# Patient Record
Sex: Female | Born: 1940 | Race: White | Hispanic: Refuse to answer | Marital: Married | Smoking: Never smoker
Health system: Southern US, Community
[De-identification: ages and names within clinical notes are randomized; demographics above are authoritative.]

## PROBLEM LIST (undated history)

## (undated) DIAGNOSIS — I10 Essential (primary) hypertension: Secondary | ICD-10-CM

## (undated) DIAGNOSIS — R270 Ataxia, unspecified: Secondary | ICD-10-CM

## (undated) DIAGNOSIS — F32A Depression, unspecified: Secondary | ICD-10-CM

## (undated) DIAGNOSIS — E7800 Pure hypercholesterolemia, unspecified: Secondary | ICD-10-CM

## (undated) DIAGNOSIS — Z22322 Carrier or suspected carrier of Methicillin resistant Staphylococcus aureus: Secondary | ICD-10-CM

## (undated) DIAGNOSIS — J449 Chronic obstructive pulmonary disease, unspecified: Secondary | ICD-10-CM

## (undated) DIAGNOSIS — I2510 Atherosclerotic heart disease of native coronary artery without angina pectoris: Secondary | ICD-10-CM

## (undated) DIAGNOSIS — F419 Anxiety disorder, unspecified: Secondary | ICD-10-CM

## (undated) DIAGNOSIS — K219 Gastro-esophageal reflux disease without esophagitis: Secondary | ICD-10-CM

## (undated) DIAGNOSIS — Z9289 Personal history of other medical treatment: Secondary | ICD-10-CM

## (undated) DIAGNOSIS — L97519 Non-pressure chronic ulcer of other part of right foot with unspecified severity: Secondary | ICD-10-CM

## (undated) DIAGNOSIS — K589 Irritable bowel syndrome without diarrhea: Secondary | ICD-10-CM

## (undated) DIAGNOSIS — IMO0001 Reserved for inherently not codable concepts without codable children: Secondary | ICD-10-CM

## (undated) DIAGNOSIS — K5790 Diverticulosis of intestine, part unspecified, without perforation or abscess without bleeding: Secondary | ICD-10-CM

## (undated) DIAGNOSIS — R296 Repeated falls: Secondary | ICD-10-CM

## (undated) DIAGNOSIS — E785 Hyperlipidemia, unspecified: Secondary | ICD-10-CM

## (undated) DIAGNOSIS — I499 Cardiac arrhythmia, unspecified: Secondary | ICD-10-CM

## (undated) DIAGNOSIS — M1990 Unspecified osteoarthritis, unspecified site: Secondary | ICD-10-CM

## (undated) DIAGNOSIS — L409 Psoriasis, unspecified: Secondary | ICD-10-CM

## (undated) DIAGNOSIS — I7300 Raynaud's syndrome without gangrene: Secondary | ICD-10-CM

## (undated) DIAGNOSIS — I219 Acute myocardial infarction, unspecified: Secondary | ICD-10-CM

## (undated) DIAGNOSIS — D649 Anemia, unspecified: Secondary | ICD-10-CM

## (undated) DIAGNOSIS — F329 Major depressive disorder, single episode, unspecified: Secondary | ICD-10-CM

## (undated) HISTORY — DX: Depression, unspecified: F32.A

## (undated) HISTORY — PX: CHOLECYSTECTOMY: SHX55

## (undated) HISTORY — DX: Major depressive disorder, single episode, unspecified: F32.9

## (undated) HISTORY — DX: Ataxia, unspecified: R27.0

## (undated) HISTORY — DX: Repeated falls: R29.6

## (undated) HISTORY — DX: Chronic obstructive pulmonary disease, unspecified: J44.9

## (undated) SURGERY — Surgical Case
Anesthesia: *Unknown

---
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
## 1978-07-17 HISTORY — PX: ABDOMINAL HYSTERECTOMY: SHX81

## 1988-07-17 HISTORY — PX: ROTATOR CUFF REPAIR: SHX139

## 2005-01-25 ENCOUNTER — Ambulatory Visit: Payer: Self-pay | Admitting: Internal Medicine

## 2006-02-07 ENCOUNTER — Ambulatory Visit: Payer: Self-pay | Admitting: Internal Medicine

## 2007-03-14 ENCOUNTER — Ambulatory Visit: Payer: Self-pay | Admitting: Internal Medicine

## 2007-04-12 ENCOUNTER — Ambulatory Visit: Payer: Self-pay | Admitting: Gastroenterology

## 2007-07-18 HISTORY — PX: KNEE ARTHROSCOPY: SUR90

## 2008-03-17 ENCOUNTER — Ambulatory Visit: Payer: Self-pay | Admitting: Internal Medicine

## 2008-11-20 ENCOUNTER — Ambulatory Visit: Payer: Self-pay | Admitting: Unknown Physician Specialty

## 2008-11-24 ENCOUNTER — Ambulatory Visit: Payer: Self-pay | Admitting: Unknown Physician Specialty

## 2009-03-30 ENCOUNTER — Ambulatory Visit: Payer: Self-pay | Admitting: Internal Medicine

## 2009-05-11 ENCOUNTER — Ambulatory Visit: Payer: Self-pay | Admitting: Unknown Physician Specialty

## 2009-06-09 ENCOUNTER — Ambulatory Visit: Payer: Self-pay | Admitting: Rheumatology

## 2009-06-18 ENCOUNTER — Ambulatory Visit: Payer: Self-pay | Admitting: Unknown Physician Specialty

## 2009-06-25 ENCOUNTER — Ambulatory Visit: Payer: Self-pay | Admitting: Unknown Physician Specialty

## 2009-10-12 ENCOUNTER — Ambulatory Visit: Payer: Self-pay | Admitting: Unknown Physician Specialty

## 2009-10-18 ENCOUNTER — Ambulatory Visit: Payer: Self-pay | Admitting: Unknown Physician Specialty

## 2009-11-16 ENCOUNTER — Ambulatory Visit: Payer: Self-pay | Admitting: Unknown Physician Specialty

## 2010-03-07 ENCOUNTER — Ambulatory Visit: Payer: Self-pay | Admitting: Unknown Physician Specialty

## 2010-03-14 ENCOUNTER — Ambulatory Visit: Payer: Self-pay | Admitting: Cardiovascular Disease

## 2010-04-27 ENCOUNTER — Ambulatory Visit: Payer: Self-pay | Admitting: Internal Medicine

## 2010-07-17 HISTORY — PX: EYE SURGERY: SHX253

## 2010-11-17 ENCOUNTER — Ambulatory Visit: Payer: Self-pay | Admitting: Unknown Physician Specialty

## 2011-05-05 ENCOUNTER — Ambulatory Visit: Payer: Self-pay | Admitting: Internal Medicine

## 2011-05-10 ENCOUNTER — Ambulatory Visit: Payer: Self-pay | Admitting: Internal Medicine

## 2011-05-17 ENCOUNTER — Ambulatory Visit: Payer: Self-pay | Admitting: Unknown Physician Specialty

## 2011-07-18 HISTORY — PX: BACK SURGERY: SHX140

## 2011-11-09 ENCOUNTER — Ambulatory Visit: Payer: Self-pay | Admitting: Internal Medicine

## 2011-11-16 ENCOUNTER — Ambulatory Visit: Payer: Self-pay | Admitting: Rheumatology

## 2012-02-05 ENCOUNTER — Other Ambulatory Visit: Payer: Self-pay | Admitting: Neurosurgery

## 2012-02-05 DIAGNOSIS — M549 Dorsalgia, unspecified: Secondary | ICD-10-CM

## 2012-02-08 ENCOUNTER — Ambulatory Visit
Admission: RE | Admit: 2012-02-08 | Discharge: 2012-02-08 | Disposition: A | Discharge status: Home / Self Care | Payer: Medicare Other | Source: Ambulatory Visit | Attending: Neurosurgery | Admitting: Neurosurgery

## 2012-02-08 VITALS — BP 138/63 | HR 58 | Resp 15 | Ht 66.0 in | Wt 140.0 lb

## 2012-02-08 DIAGNOSIS — M549 Dorsalgia, unspecified: Secondary | ICD-10-CM

## 2012-02-08 MED ORDER — DIAZEPAM 5 MG PO TABS
5.0000 mg | ORAL_TABLET | Freq: Once | ORAL | Status: AC
  Administered 2012-02-08: 5 mg via ORAL

## 2012-02-08 MED ORDER — IOHEXOL 180 MG/ML  SOLN
18.0000 mL | Freq: Once | INTRAMUSCULAR | Status: AC | PRN
  Administered 2012-02-08: 18 mL via INTRATHECAL

## 2012-02-08 MED ORDER — ONDANSETRON HCL 4 MG/2ML IJ SOLN: 4.0000 mg | Freq: Four times a day (QID) | INTRAMUSCULAR | Status: DC | PRN

## 2012-11-11 ENCOUNTER — Ambulatory Visit: Payer: Self-pay | Admitting: Internal Medicine

## 2013-01-20 ENCOUNTER — Ambulatory Visit: Payer: Self-pay | Admitting: Unknown Physician Specialty

## 2013-06-06 ENCOUNTER — Ambulatory Visit: Payer: Self-pay | Admitting: Physician Assistant

## 2013-06-16 ENCOUNTER — Ambulatory Visit: Payer: Self-pay | Admitting: Orthopedic Surgery

## 2013-06-16 LAB — CBC WITH DIFFERENTIAL/PLATELET
Basophil #: 0.1 10*3/uL (ref 0.0–0.1)
Basophil %: 0.7 %
HCT: 34.8 % — ABNORMAL LOW (ref 35.0–47.0)
Lymphocyte %: 13.8 %
MCHC: 33.4 g/dL (ref 32.0–36.0)
Monocyte #: 1.2 x10 3/mm — ABNORMAL HIGH (ref 0.2–0.9)
Monocyte %: 14.3 %
Platelet: 226 10*3/uL (ref 150–440)
RBC: 3.64 10*6/uL — ABNORMAL LOW (ref 3.80–5.20)

## 2013-06-16 LAB — BASIC METABOLIC PANEL
BUN: 23 mg/dL — ABNORMAL HIGH (ref 7–18)
Calcium, Total: 9.7 mg/dL (ref 8.5–10.1)
Co2: 30 mmol/L (ref 21–32)
Creatinine: 0.58 mg/dL — ABNORMAL LOW (ref 0.60–1.30)
EGFR (African American): >60
Glucose: 96 mg/dL (ref 65–99)
Osmolality: 281 (ref 275–301)
Sodium: 139 mmol/L (ref 136–145)

## 2013-06-25 ENCOUNTER — Ambulatory Visit: Payer: Self-pay | Admitting: Orthopedic Surgery

## 2013-11-12 ENCOUNTER — Ambulatory Visit: Payer: Self-pay | Admitting: Internal Medicine

## 2014-08-18 DIAGNOSIS — M359 Systemic involvement of connective tissue, unspecified: Secondary | ICD-10-CM | POA: Diagnosis not present

## 2014-08-18 DIAGNOSIS — Z79899 Other long term (current) drug therapy: Secondary | ICD-10-CM | POA: Diagnosis not present

## 2014-08-18 DIAGNOSIS — I7300 Raynaud's syndrome without gangrene: Secondary | ICD-10-CM | POA: Diagnosis not present

## 2014-08-18 DIAGNOSIS — M150 Primary generalized (osteo)arthritis: Secondary | ICD-10-CM | POA: Diagnosis not present

## 2014-10-12 DIAGNOSIS — Z79899 Other long term (current) drug therapy: Secondary | ICD-10-CM | POA: Diagnosis not present

## 2014-10-19 DIAGNOSIS — M25562 Pain in left knee: Secondary | ICD-10-CM | POA: Diagnosis not present

## 2014-10-19 DIAGNOSIS — G8929 Other chronic pain: Secondary | ICD-10-CM | POA: Diagnosis not present

## 2014-10-19 DIAGNOSIS — M359 Systemic involvement of connective tissue, unspecified: Secondary | ICD-10-CM | POA: Diagnosis not present

## 2014-10-19 DIAGNOSIS — I7300 Raynaud's syndrome without gangrene: Secondary | ICD-10-CM | POA: Diagnosis not present

## 2014-10-19 DIAGNOSIS — Z79899 Other long term (current) drug therapy: Secondary | ICD-10-CM | POA: Diagnosis not present

## 2014-11-04 DIAGNOSIS — I10 Essential (primary) hypertension: Secondary | ICD-10-CM | POA: Diagnosis not present

## 2014-11-04 DIAGNOSIS — J449 Chronic obstructive pulmonary disease, unspecified: Secondary | ICD-10-CM | POA: Diagnosis not present

## 2014-11-04 DIAGNOSIS — E785 Hyperlipidemia, unspecified: Secondary | ICD-10-CM | POA: Diagnosis not present

## 2014-11-04 DIAGNOSIS — D649 Anemia, unspecified: Secondary | ICD-10-CM | POA: Diagnosis not present

## 2014-11-06 NOTE — Op Note (Signed)
PATIENT NAME:  Melissa [name], Melissa [name] MR#:  [id number] DATE OF BIRTH:  July 23, 1940  DATE OF PROCEDURE:  06/25/2013  PREOPERATIVE DIAGNOSIS: Left knee Baker's cyst.   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   PROCEDURE: Arthroscopy, partial lateral meniscectomy, arthroscopic debridement, excision of Baker's cyst.   ANESTHESIA: General.   SURGEON: Hessie Knows, M.D.   DESCRIPTION OF PROCEDURE: The patient was brought to the Operating Room and, after adequate anesthesia was obtained, the left leg was placed in the arthroscopic leg holder with a tourniquet applied but not required during the procedure with the prepped and draped in the usual sterile manner. Appropriate patient identification and timeout procedures were carried out. An inferolateral portal was made and the arthroscope was introduced. Initial inspection revealed  mild central patellar chondromalacia, relatively normal femoral trochlea. Coming around medially, an inferomedial portal was made. There was relatively normal articular cartilage on the femur and tibia with just a small degenerative tear of the posterior horn of the medial meniscus involving just a few millimeters of the meniscus. This was subsequently ablated with the ArthroCare wand. The ACL was intact. In the lateral compartment, there was significant findings with exposed bone on portions of the femoral condyle and tibial condyle with extensive degenerative tears of the anterior, posterior and middle thirds of the lateral meniscus. A meniscal punch, shaver and ArthroCare wand were used to debride these along with some very large loose pieces of articular cartilage which were not really loose bodies as they were not solid and not of a large enough size, but several were fairly significant size consistent with recent articular cartilage injury.   At this point, having addressed the intra-articular pathology, the posteromedial Baker's  cyst was addressed. Placing the arthroscope through the notch, the posterior flap could be visualized into the Baker's cyst. A posteromedial portal was made initially using a spinal needle posterior to the collateral ligament and then a small stab incision made followed by placing of a probe. Next, a shaver was introduced to debride the superficial layers until the cyst fluid could be seen to come out.  When this path was identified, it was opened up to approximately a centimeter in diameter. With direct pressure on the cyst, the cyst could be felt to drain into the knee and with turning the inflow back on the distal cyst filled again with fluid. After adequate debridement posteriorly and pre- and postprocedure pictures being obtained, all instrumentation was withdrawn.   The wounds were closed with simple interrupted 4-0 nylon, single suture for each incision. Local anesthetic, 20 mL of 0.5% Sensorcaine with epinephrine, was infiltrated into the area of portals for postoperative analgesia. Sterile dressings of Xeroform, 4 x 4's, Webril and Ace wrap were applied and the patient was sent to the Recovery Room in stable condition. There was no complications.   SPECIMENS:  None.     ____________________________ Laurene Footman, MD mjm:cs D: 06/25/2013 17:50:13 ET T: 06/25/2013 20:21:32 ET JOB#: [id number]  cc: Laurene Footman, MD, <Dictator> Laurene Footman MD ELECTRONICALLY SIGNED 06/26/2013 11:30

## 2014-11-06 NOTE — Consult Note (Signed)
PATIENT NAME:  Melissa [name], Melissa [name] MR#:  [id number] DATE OF BIRTH:  08-Oct-1940  DATE OF CONSULTATION:  06/25/2013  REFERRING PHYSICIAN:  Dr. Rudene Christians. CONSULTING PHYSICIAN:  Corey Skains, MD  REASON FOR CONSULTATION:  Irregular heartbeats, heart block with pulmonary fibrosis.   CHIEF COMPLAINT: The patient has had recent surgery.  HISTORY OF PRESENT ILLNESS:  This is a 74 year old female with no history of cardiovascular disease, who has had pulmonary fibrosis and recent surgery. With her surgery and anesthesia, the patient has had some irregular heartbeat, and EKG showing normal sinus rhythm, otherwise normal EKG. Telemetry showing second-degree type I AV block and 3 atrial contractions but no other malignant rhythms or other EKG changes. There  has been no chest pain, shortness of breath or congestive heart failure. The patient has been stable at this time, and these rhythms are slowly improving with recovery.   REVIEW OF SYSTEMS:  Not able to assess due to some obtundation.   PAST MEDICAL HISTORY:  1.  Pulmonary fibrosis.  2.  Arthritis.    FAMILY HISTORY: No family members with early onset of cardiovascular disease or hypertension.   SOCIAL HISTORY: The patient current denies alcohol or tobacco use.   CURRENT MEDICATIONS AND ALLERGIES: As listed.   PHYSICAL EXAMINATION: VITAL SIGNS: Blood pressure is 122/68 bilaterally. Heart rate is 70 upright, reclining and regular.  GENERAL: She is a well-appearing female in no acute distress.  HEAD, EYES, EARS, NOSE, THROAT: No icterus, thyromegaly, ulcers, hemorrhage or xanthelasma.  CARDIOVASCULAR: Regular rate and rhythm with normal S1 and S2 without murmur, gallop  or rub. PMI is normal size and placement. Carotid upstroke normal without bruit. JVD appears normal.  LUNGS: Have few basilar crackles with normal respirations.  ABDOMEN: Soft, nontender, without hepatosplenomegaly or masses. Abdominal aorta is normal size without bruit.  EXTREMITIES:  Shows 2+ bilateral pulses in dorsal, pedal, radial and femoral arteries without lower extremity edema, signs of clubbing or ulcers.  NEUROLOGIC: She is oriented to time, place and person, with normal mood and affect.   ASSESSMENT: A 74 year old female with pulmonary fibrosis and recent surgery with 3 atrial   contractions, second-degree type I AV block without evidence of significant cardiovascular abnormalities requiring further intervention.   RECOMMENDATIONS: 1.  No further intervention of benign   atrial contractions and other rhythm disturbances.  2.  Continue recovery without restriction.  3.  No further cardiac intervention at this time.        ____________________________ Corey Skains, MD bjk:dmm D: 06/25/2013 17:55:00 ET T: 06/25/2013 19:05:35 ET JOB#: [id number]  cc: Corey Skains, MD, <Dictator> Corey Skains MD ELECTRONICALLY SIGNED 06/26/2013 16:53

## 2014-11-10 ENCOUNTER — Other Ambulatory Visit: Payer: Self-pay | Admitting: Internal Medicine

## 2014-11-10 DIAGNOSIS — Z139 Encounter for screening, unspecified: Secondary | ICD-10-CM

## 2014-11-18 ENCOUNTER — Other Ambulatory Visit: Payer: Self-pay | Admitting: Internal Medicine

## 2014-11-18 ENCOUNTER — Ambulatory Visit
Admission: RE | Admit: 2014-11-18 | Discharge: 2014-11-18 | Disposition: A | Discharge status: Home / Self Care | Payer: Commercial Managed Care - HMO | Source: Ambulatory Visit | Attending: Internal Medicine | Admitting: Internal Medicine

## 2014-11-18 DIAGNOSIS — Z1231 Encounter for screening mammogram for malignant neoplasm of breast: Secondary | ICD-10-CM | POA: Diagnosis not present

## 2014-11-18 DIAGNOSIS — Z139 Encounter for screening, unspecified: Secondary | ICD-10-CM

## 2014-11-24 DIAGNOSIS — M47817 Spondylosis without myelopathy or radiculopathy, lumbosacral region: Secondary | ICD-10-CM | POA: Diagnosis not present

## 2014-11-27 DIAGNOSIS — H524 Presbyopia: Secondary | ICD-10-CM | POA: Diagnosis not present

## 2014-11-27 DIAGNOSIS — H5210 Myopia, unspecified eye: Secondary | ICD-10-CM | POA: Diagnosis not present

## 2014-12-02 DIAGNOSIS — H40003 Preglaucoma, unspecified, bilateral: Secondary | ICD-10-CM | POA: Diagnosis not present

## 2015-01-20 DIAGNOSIS — D649 Anemia, unspecified: Secondary | ICD-10-CM | POA: Diagnosis not present

## 2015-01-20 DIAGNOSIS — I10 Essential (primary) hypertension: Secondary | ICD-10-CM | POA: Diagnosis not present

## 2015-01-20 DIAGNOSIS — M359 Systemic involvement of connective tissue, unspecified: Secondary | ICD-10-CM | POA: Diagnosis not present

## 2015-01-20 DIAGNOSIS — E785 Hyperlipidemia, unspecified: Secondary | ICD-10-CM | POA: Diagnosis not present

## 2015-01-20 DIAGNOSIS — Z79899 Other long term (current) drug therapy: Secondary | ICD-10-CM | POA: Diagnosis not present

## 2015-04-13 DIAGNOSIS — M359 Systemic involvement of connective tissue, unspecified: Secondary | ICD-10-CM | POA: Diagnosis not present

## 2015-04-13 DIAGNOSIS — Z79899 Other long term (current) drug therapy: Secondary | ICD-10-CM | POA: Diagnosis not present

## 2015-04-20 DIAGNOSIS — M359 Systemic involvement of connective tissue, unspecified: Secondary | ICD-10-CM | POA: Diagnosis not present

## 2015-04-20 DIAGNOSIS — M150 Primary generalized (osteo)arthritis: Secondary | ICD-10-CM | POA: Diagnosis not present

## 2015-04-20 DIAGNOSIS — I7300 Raynaud's syndrome without gangrene: Secondary | ICD-10-CM | POA: Diagnosis not present

## 2015-04-20 DIAGNOSIS — Z79899 Other long term (current) drug therapy: Secondary | ICD-10-CM | POA: Diagnosis not present

## 2015-04-20 DIAGNOSIS — D500 Iron deficiency anemia secondary to blood loss (chronic): Secondary | ICD-10-CM | POA: Diagnosis not present

## 2015-04-20 DIAGNOSIS — G8929 Other chronic pain: Secondary | ICD-10-CM | POA: Diagnosis not present

## 2015-04-20 DIAGNOSIS — M25562 Pain in left knee: Secondary | ICD-10-CM | POA: Diagnosis not present

## 2015-04-28 DIAGNOSIS — D500 Iron deficiency anemia secondary to blood loss (chronic): Secondary | ICD-10-CM | POA: Diagnosis not present

## 2015-05-12 DIAGNOSIS — D649 Anemia, unspecified: Secondary | ICD-10-CM | POA: Diagnosis not present

## 2015-05-12 DIAGNOSIS — Z0000 Encounter for general adult medical examination without abnormal findings: Secondary | ICD-10-CM | POA: Diagnosis not present

## 2015-05-12 DIAGNOSIS — K219 Gastro-esophageal reflux disease without esophagitis: Secondary | ICD-10-CM | POA: Diagnosis not present

## 2015-05-12 DIAGNOSIS — J431 Panlobular emphysema: Secondary | ICD-10-CM | POA: Diagnosis not present

## 2015-05-12 DIAGNOSIS — J439 Emphysema, unspecified: Secondary | ICD-10-CM | POA: Diagnosis not present

## 2015-05-12 DIAGNOSIS — Z23 Encounter for immunization: Secondary | ICD-10-CM | POA: Diagnosis not present

## 2015-05-12 DIAGNOSIS — I10 Essential (primary) hypertension: Secondary | ICD-10-CM | POA: Diagnosis not present

## 2015-05-12 DIAGNOSIS — Z79899 Other long term (current) drug therapy: Secondary | ICD-10-CM | POA: Diagnosis not present

## 2015-05-12 DIAGNOSIS — E782 Mixed hyperlipidemia: Secondary | ICD-10-CM | POA: Diagnosis not present

## 2015-06-02 DIAGNOSIS — H40003 Preglaucoma, unspecified, bilateral: Secondary | ICD-10-CM | POA: Diagnosis not present

## 2015-07-14 DIAGNOSIS — M359 Systemic involvement of connective tissue, unspecified: Secondary | ICD-10-CM | POA: Diagnosis not present

## 2015-07-14 DIAGNOSIS — Z79899 Other long term (current) drug therapy: Secondary | ICD-10-CM | POA: Diagnosis not present

## 2015-07-14 DIAGNOSIS — E782 Mixed hyperlipidemia: Secondary | ICD-10-CM | POA: Diagnosis not present

## 2015-07-21 DIAGNOSIS — M150 Primary generalized (osteo)arthritis: Secondary | ICD-10-CM | POA: Diagnosis not present

## 2015-07-21 DIAGNOSIS — Z79899 Other long term (current) drug therapy: Secondary | ICD-10-CM | POA: Diagnosis not present

## 2015-07-21 DIAGNOSIS — E782 Mixed hyperlipidemia: Secondary | ICD-10-CM | POA: Diagnosis not present

## 2015-07-21 DIAGNOSIS — I7300 Raynaud's syndrome without gangrene: Secondary | ICD-10-CM | POA: Diagnosis not present

## 2015-07-21 DIAGNOSIS — M359 Systemic involvement of connective tissue, unspecified: Secondary | ICD-10-CM | POA: Diagnosis not present

## 2015-08-07 DIAGNOSIS — R5383 Other fatigue: Secondary | ICD-10-CM | POA: Diagnosis not present

## 2015-08-07 DIAGNOSIS — I10 Essential (primary) hypertension: Secondary | ICD-10-CM | POA: Diagnosis not present

## 2015-08-07 DIAGNOSIS — J410 Simple chronic bronchitis: Secondary | ICD-10-CM | POA: Diagnosis not present

## 2015-08-11 DIAGNOSIS — M175 Other unilateral secondary osteoarthritis of knee: Secondary | ICD-10-CM | POA: Diagnosis not present

## 2015-10-06 DIAGNOSIS — M546 Pain in thoracic spine: Secondary | ICD-10-CM | POA: Diagnosis not present

## 2015-10-06 DIAGNOSIS — F3341 Major depressive disorder, recurrent, in partial remission: Secondary | ICD-10-CM | POA: Diagnosis not present

## 2015-10-06 DIAGNOSIS — R0781 Pleurodynia: Secondary | ICD-10-CM | POA: Diagnosis not present

## 2015-10-06 DIAGNOSIS — J431 Panlobular emphysema: Secondary | ICD-10-CM | POA: Diagnosis not present

## 2015-10-06 DIAGNOSIS — R0602 Shortness of breath: Secondary | ICD-10-CM | POA: Diagnosis not present

## 2015-10-19 DIAGNOSIS — Z79899 Other long term (current) drug therapy: Secondary | ICD-10-CM | POA: Diagnosis not present

## 2015-10-19 DIAGNOSIS — I7300 Raynaud's syndrome without gangrene: Secondary | ICD-10-CM | POA: Diagnosis not present

## 2015-10-19 DIAGNOSIS — M150 Primary generalized (osteo)arthritis: Secondary | ICD-10-CM | POA: Diagnosis not present

## 2015-11-10 DIAGNOSIS — Z1239 Encounter for other screening for malignant neoplasm of breast: Secondary | ICD-10-CM | POA: Diagnosis not present

## 2015-11-10 DIAGNOSIS — R0609 Other forms of dyspnea: Secondary | ICD-10-CM | POA: Diagnosis not present

## 2015-11-10 DIAGNOSIS — I10 Essential (primary) hypertension: Secondary | ICD-10-CM | POA: Diagnosis not present

## 2015-11-10 DIAGNOSIS — R270 Ataxia, unspecified: Secondary | ICD-10-CM | POA: Diagnosis not present

## 2015-11-10 DIAGNOSIS — E782 Mixed hyperlipidemia: Secondary | ICD-10-CM | POA: Diagnosis not present

## 2015-11-10 DIAGNOSIS — J431 Panlobular emphysema: Secondary | ICD-10-CM | POA: Diagnosis not present

## 2015-11-10 DIAGNOSIS — R42 Dizziness and giddiness: Secondary | ICD-10-CM | POA: Diagnosis not present

## 2015-11-15 ENCOUNTER — Other Ambulatory Visit: Payer: Self-pay | Admitting: Internal Medicine

## 2015-11-15 DIAGNOSIS — Z1231 Encounter for screening mammogram for malignant neoplasm of breast: Secondary | ICD-10-CM

## 2015-11-22 DIAGNOSIS — R0609 Other forms of dyspnea: Secondary | ICD-10-CM | POA: Diagnosis not present

## 2015-11-22 DIAGNOSIS — R42 Dizziness and giddiness: Secondary | ICD-10-CM | POA: Diagnosis not present

## 2015-11-24 ENCOUNTER — Other Ambulatory Visit: Payer: Self-pay | Admitting: Internal Medicine

## 2015-11-24 ENCOUNTER — Ambulatory Visit
Admission: RE | Admit: 2015-11-24 | Discharge: 2015-11-24 | Disposition: A | Discharge status: Home / Self Care | Payer: Commercial Managed Care - HMO | Source: Ambulatory Visit | Attending: Internal Medicine | Admitting: Internal Medicine

## 2015-11-24 DIAGNOSIS — Z1231 Encounter for screening mammogram for malignant neoplasm of breast: Secondary | ICD-10-CM | POA: Diagnosis not present

## 2015-11-30 DIAGNOSIS — E7800 Pure hypercholesterolemia, unspecified: Secondary | ICD-10-CM | POA: Diagnosis not present

## 2015-11-30 DIAGNOSIS — J410 Simple chronic bronchitis: Secondary | ICD-10-CM | POA: Diagnosis not present

## 2015-11-30 DIAGNOSIS — J8410 Pulmonary fibrosis, unspecified: Secondary | ICD-10-CM | POA: Diagnosis not present

## 2015-11-30 DIAGNOSIS — R002 Palpitations: Secondary | ICD-10-CM | POA: Diagnosis not present

## 2015-11-30 DIAGNOSIS — I10 Essential (primary) hypertension: Secondary | ICD-10-CM | POA: Diagnosis not present

## 2015-11-30 DIAGNOSIS — R42 Dizziness and giddiness: Secondary | ICD-10-CM | POA: Diagnosis not present

## 2015-12-03 DIAGNOSIS — R1031 Right lower quadrant pain: Secondary | ICD-10-CM | POA: Diagnosis not present

## 2015-12-03 DIAGNOSIS — R197 Diarrhea, unspecified: Secondary | ICD-10-CM | POA: Diagnosis not present

## 2015-12-03 DIAGNOSIS — R1032 Left lower quadrant pain: Secondary | ICD-10-CM | POA: Diagnosis not present

## 2015-12-06 DIAGNOSIS — R42 Dizziness and giddiness: Secondary | ICD-10-CM | POA: Diagnosis not present

## 2015-12-07 ENCOUNTER — Other Ambulatory Visit: Payer: Self-pay | Admitting: Internal Medicine

## 2015-12-07 DIAGNOSIS — R112 Nausea with vomiting, unspecified: Secondary | ICD-10-CM | POA: Diagnosis not present

## 2015-12-07 DIAGNOSIS — R197 Diarrhea, unspecified: Secondary | ICD-10-CM | POA: Diagnosis not present

## 2015-12-07 DIAGNOSIS — Z79899 Other long term (current) drug therapy: Secondary | ICD-10-CM | POA: Diagnosis not present

## 2015-12-07 DIAGNOSIS — R1084 Generalized abdominal pain: Secondary | ICD-10-CM

## 2015-12-08 DIAGNOSIS — R197 Diarrhea, unspecified: Secondary | ICD-10-CM | POA: Diagnosis not present

## 2015-12-10 DIAGNOSIS — H524 Presbyopia: Secondary | ICD-10-CM | POA: Diagnosis not present

## 2015-12-10 DIAGNOSIS — H5210 Myopia, unspecified eye: Secondary | ICD-10-CM | POA: Diagnosis not present

## 2015-12-14 DIAGNOSIS — H40003 Preglaucoma, unspecified, bilateral: Secondary | ICD-10-CM | POA: Diagnosis not present

## 2015-12-20 ENCOUNTER — Ambulatory Visit
Admission: RE | Admit: 2015-12-20 | Discharge: 2015-12-20 | Disposition: A | Discharge status: Home / Self Care | Payer: Commercial Managed Care - HMO | Source: Ambulatory Visit | Attending: Internal Medicine | Admitting: Internal Medicine

## 2015-12-20 ENCOUNTER — Ambulatory Visit: Admission: RE | Admit: 2015-12-20 | Payer: Commercial Managed Care - HMO | Source: Ambulatory Visit

## 2015-12-20 DIAGNOSIS — K5730 Diverticulosis of large intestine without perforation or abscess without bleeding: Secondary | ICD-10-CM | POA: Diagnosis not present

## 2015-12-20 DIAGNOSIS — I700 Atherosclerosis of aorta: Secondary | ICD-10-CM | POA: Diagnosis not present

## 2015-12-20 DIAGNOSIS — R197 Diarrhea, unspecified: Secondary | ICD-10-CM | POA: Insufficient documentation

## 2015-12-20 DIAGNOSIS — R1084 Generalized abdominal pain: Secondary | ICD-10-CM | POA: Diagnosis not present

## 2015-12-20 HISTORY — DX: Raynaud's syndrome without gangrene: I73.00

## 2015-12-20 HISTORY — DX: Essential (primary) hypertension: I10

## 2015-12-20 MED ORDER — IOPAMIDOL (ISOVUE-300) INJECTION 61%
85.0000 mL | Freq: Once | INTRAVENOUS | Status: AC | PRN
  Administered 2015-12-20: 85 mL via INTRAVENOUS

## 2016-01-05 DIAGNOSIS — R270 Ataxia, unspecified: Secondary | ICD-10-CM | POA: Diagnosis not present

## 2016-01-05 DIAGNOSIS — R296 Repeated falls: Secondary | ICD-10-CM | POA: Diagnosis not present

## 2016-01-20 DIAGNOSIS — Z79899 Other long term (current) drug therapy: Secondary | ICD-10-CM | POA: Diagnosis not present

## 2016-01-20 DIAGNOSIS — I7300 Raynaud's syndrome without gangrene: Secondary | ICD-10-CM | POA: Diagnosis not present

## 2016-01-20 DIAGNOSIS — M150 Primary generalized (osteo)arthritis: Secondary | ICD-10-CM | POA: Diagnosis not present

## 2016-01-26 DIAGNOSIS — D500 Iron deficiency anemia secondary to blood loss (chronic): Secondary | ICD-10-CM | POA: Diagnosis not present

## 2016-01-26 DIAGNOSIS — I7300 Raynaud's syndrome without gangrene: Secondary | ICD-10-CM | POA: Diagnosis not present

## 2016-01-26 DIAGNOSIS — M150 Primary generalized (osteo)arthritis: Secondary | ICD-10-CM | POA: Diagnosis not present

## 2016-01-26 DIAGNOSIS — M359 Systemic involvement of connective tissue, unspecified: Secondary | ICD-10-CM | POA: Diagnosis not present

## 2016-01-26 DIAGNOSIS — Z79899 Other long term (current) drug therapy: Secondary | ICD-10-CM | POA: Diagnosis not present

## 2016-01-26 DIAGNOSIS — R260 Ataxic gait: Secondary | ICD-10-CM | POA: Diagnosis not present

## 2016-01-28 ENCOUNTER — Other Ambulatory Visit: Payer: Self-pay | Admitting: Orthopedic Surgery

## 2016-01-28 DIAGNOSIS — M175 Other unilateral secondary osteoarthritis of knee: Secondary | ICD-10-CM

## 2016-02-01 DIAGNOSIS — M150 Primary generalized (osteo)arthritis: Secondary | ICD-10-CM | POA: Diagnosis not present

## 2016-02-04 DIAGNOSIS — M810 Age-related osteoporosis without current pathological fracture: Secondary | ICD-10-CM | POA: Diagnosis not present

## 2016-02-09 DIAGNOSIS — J410 Simple chronic bronchitis: Secondary | ICD-10-CM | POA: Diagnosis not present

## 2016-02-09 DIAGNOSIS — K580 Irritable bowel syndrome with diarrhea: Secondary | ICD-10-CM | POA: Diagnosis not present

## 2016-02-09 DIAGNOSIS — J8410 Pulmonary fibrosis, unspecified: Secondary | ICD-10-CM | POA: Diagnosis not present

## 2016-02-09 DIAGNOSIS — R29898 Other symptoms and signs involving the musculoskeletal system: Secondary | ICD-10-CM | POA: Diagnosis not present

## 2016-02-09 DIAGNOSIS — M150 Primary generalized (osteo)arthritis: Secondary | ICD-10-CM | POA: Diagnosis not present

## 2016-02-09 DIAGNOSIS — E7800 Pure hypercholesterolemia, unspecified: Secondary | ICD-10-CM | POA: Diagnosis not present

## 2016-02-09 DIAGNOSIS — F411 Generalized anxiety disorder: Secondary | ICD-10-CM | POA: Diagnosis not present

## 2016-02-21 ENCOUNTER — Encounter: Payer: Self-pay | Admitting: Physical Therapy

## 2016-02-21 ENCOUNTER — Ambulatory Visit: Payer: Commercial Managed Care - HMO | Attending: Internal Medicine | Admitting: Physical Therapy

## 2016-02-21 DIAGNOSIS — M6281 Muscle weakness (generalized): Secondary | ICD-10-CM | POA: Insufficient documentation

## 2016-02-21 DIAGNOSIS — R262 Difficulty in walking, not elsewhere classified: Secondary | ICD-10-CM

## 2016-02-21 DIAGNOSIS — R2681 Unsteadiness on feet: Secondary | ICD-10-CM | POA: Diagnosis not present

## 2016-02-21 NOTE — Therapy (Signed)
[facility] [address], [place], [address] Phone: [phone]   Fax:  [phone]  Physical Therapy Evaluation  Patient Details  Name: Melissa [name] MRN: [id number] Date of Birth: [birth year] Referring Provider: Doy Hutching  Encounter Date: 02/21/2016      PT End of Session - 02/21/16 0924    Visit Number 1   Number of Visits 25   Date for PT Re-Evaluation 04/03/16   Authorization Type g codes   Authorization Time Period 1/10   PT Start Time 0827   PT Stop Time 0916   PT Time Calculation (min) 49 min   Equipment Utilized During Treatment Gait belt   Activity Tolerance Patient tolerated treatment well   Behavior During Therapy [facility] for tasks assessed/performed      Past Medical History:  Diagnosis Date  . Ataxia   . COPD (chronic obstructive pulmonary disease) ([place])   . Depression   . Excessive falling   . Hypertension   . Raynaud's disease without gangrene   . Raynaud's disease without gangrene     Past Surgical History:  Procedure Laterality Date  . BACK SURGERY    . KNEE ARTHROSCOPY     partial lateral meniscectomy, debridement, excision of Baker's cyst  . ROTATOR CUFF REPAIR      There were no vitals filed for this visit.       Subjective Assessment - 02/21/16 0834    Subjective Pt presents with B L>R leg weakness and pain. She is planning to have L knee TKR on September 19th. Pt reports her MD wanted her to get stronger prior to her surgery for a better recovery. She has had 2 falls in April/May. Pt wears L knee brace for support.   Limitations Lifting;Walking   How long can you walk comfortably? hurts "every step"   Patient Stated Goals to build strength before knee surgery   Currently in Pain? Yes   Pain Score 3   L knee   Pain Descriptors / Indicators Aching   Pain Type Chronic pain   Pain Onset More than a month ago   Pain Frequency Intermittent   Aggravating Factors  standing,  bending, walking   Pain Relieving Factors rest, pain medicine   Effect of Pain on Daily Activities decreased            [facility] PT Assessment - 02/21/16 0001      Assessment   Medical Diagnosis B leg weakness   Referring Provider [place]   Onset Date/Surgical Date --  chronic   Next MD Visit 3 months   Prior Therapy none     Precautions   Precautions Fall     Restrictions   Weight Bearing Restrictions No     Balance Screen   Has the patient fallen in the past 6 months Yes   How many times? 2   Has the patient had a decrease in activity level because of a fear of falling?  Yes   Is the patient reluctant to leave their home because of a fear of falling?  No     Home Ecologist residence   Living Arrangements Spouse/significant other   Available Help at Discharge Family   Type of [place] to enter   Entrance Stairs-Number of Steps 1   [place] One level   [place] - 2 wheels  Prior Function   Level of [place] Independent   Vocation Retired   Leisure reading, playing on [place]: Forwards head, rounded shoulders, kyphotic   PROM/AROM: B UE and LE grossly WFL Pain with end range L knee flexion  STRENGTH:  Graded on a 0-5 scale Muscle Group Left Right  Hip Flex 3+/5 4/5  Hip Abd 3+/5 4/5  Hip Add 4/5 4/5  Hip Ext    Hip IR/ER    Knee Flex 3+/5 4/5  Knee Ext 3+/5 4/5  Ankle DF 3+/5 4/5  Ankle PF     SENSATION: L LE L3 and below reduced to light touch by ~15%   SPECIAL TESTS: LEFS  36/80  FUNCTIONAL MOBILITY: Reduced walking distance, standing tolerance and ability to rise  BALANCE: Fair standing balance  GAIT: Significant B (L>R) knee valgus, L knee brace applied, no AD, decreased trunk rotation  OUTCOME MEASURES: TEST Outcome Interpretation  5 times sit<>stand 27.08 sec >60 yo, >15 sec indicates increased risk  for falls  10 meter walk test       0.67 m/s <1.0 m/s indicates increased risk for falls; limited community ambulator  Timed up and Go       15.21 sec <14 sec indicates increased risk for falls   Therex:  Supine  Heel slides, SLR, SAQs, hip abd slides, bridges, clamshells with YTB x10 each  HEP of these provided to pt. See pt instructions.   Cues for proper technique of exercises to target specific muscles and slow eccentric contractions for most effective strengthening.                     PT Education - 02/21/16 0926    Education provided Yes   Education Details HEP see pt instructions   Person(s) Educated Patient   Methods Explanation;Demonstration;Handout   Comprehension Verbalized understanding;Returned demonstration             PT Long Term Goals - 02/21/16 1039      PT LONG TERM GOAL #1   Title Pt will score <15 sec on 5x STS to reduce risk of falls withint 12 weeks.   Baseline 27.08 sec   Time 12   Period Weeks   Status New     PT LONG TERM GOAL #2   Title Pt will score >1.0 m/s on 10 MW for increased functional mobility without AD within 12 weeks.   Baseline 0.67 m/s   Time 12   Period Weeks   Status New     PT LONG TERM GOAL #3   Title Pt will score 10 sec or less on TUG to reduce risk of falls within 12 weeks.   Baseline 15.21 sec1   Time 12   Period Weeks   Status New     PT LONG TERM GOAL #4   Title Pt will improve LEFS score by 9 points for increased function within 12 weeks.   Baseline 36/80   Time 12   Period Weeks   Status New               Plan - 02/21/16 3557    Clinical Impression Statement 75 yo F presented with B LE weakness and knee pain L>R. She has decreased gait speed and impaired mechanics, strength, balance and endurance deficits indicated by 5x STS, 10 meter walk and TUG scores. LEFS score 36/80. She will benefit from skilled PT services to address functional deficits and  improve functional mobility and  QOL.   Rehab Potential Fair   Clinical Impairments Affecting Rehab Potential age, OA, chronic   PT Frequency 2x / week   PT Duration 12 weeks   PT Treatment/Interventions Cryotherapy;Electrical Stimulation;Moist Heat;Ultrasound;Gait training;DME Lexicographer;Therapeutic activities;Therapeutic exercise;Balance training;Neuromuscular re-education;Patient/family education;Manual techniques   PT Next Visit Plan LE strengthening   PT Home Exercise Plan see pt instructions   Consulted and Agree with Plan of Care Patient      Patient will benefit from skilled therapeutic intervention in order to improve the following deficits and impairments:  Abnormal gait, Decreased balance, Decreased endurance, Decreased strength, Difficulty walking, Impaired sensation, Pain  Visit Diagnosis: Muscle weakness (generalized) - Plan: PT plan of care cert/re-cert  Difficulty in walking, not elsewhere classified - Plan: PT plan of care cert/re-cert  Unsteadiness on feet - Plan: PT plan of care cert/re-cert      G-Codes - 50/41/36 1046    Functional Assessment Tool Used 5x STS, 10 MW,, TUG, LEFS   Functional Limitation Mobility: Walking and moving around   Mobility: Walking and Moving Around Current Status (C3837) At least 40 percent but less than 60 percent impaired, limited or restricted   Mobility: Walking and Moving Around Goal Status (R9396) At least 1 percent but less than 20 percent impaired, limited or restricted       Problem List There are no active problems to display for this patient.   Neoma Laming, PT, DPT  02/21/16, 10:58 AM [facility] [address] [place], [address] Phone: [phone]   Fax:  [phone]  Name: LEAIRA FULLAM MRN: [id number] Date of Birth: 05-08-1941

## 2016-02-21 NOTE — Patient Instructions (Addendum)
HEP provided and reviewed: supine heel slides, SLR, SAQ, hip abd slides, bridges 2x10 each. Created on [email or web address]

## 2016-02-23 ENCOUNTER — Encounter: Payer: Self-pay | Admitting: Physical Therapy

## 2016-02-23 ENCOUNTER — Ambulatory Visit: Payer: Commercial Managed Care - HMO | Admitting: Physical Therapy

## 2016-02-23 DIAGNOSIS — R2681 Unsteadiness on feet: Secondary | ICD-10-CM | POA: Diagnosis not present

## 2016-02-23 DIAGNOSIS — R262 Difficulty in walking, not elsewhere classified: Secondary | ICD-10-CM | POA: Diagnosis not present

## 2016-02-23 DIAGNOSIS — M6281 Muscle weakness (generalized): Secondary | ICD-10-CM | POA: Diagnosis not present

## 2016-02-23 NOTE — Therapy (Addendum)
[facility] [address], [place], [address] Phone: [phone]   Fax:  [phone]  Physical Therapy Treatment  Patient Details  Name: Melissa [name] MRN: [id number] Date of Birth: [birth year] Referring Provider: Doy Hutching  Encounter Date: 02/23/2016      PT End of Session - 02/23/16 1038    Visit Number 2   Number of Visits 25   Date for PT Re-Evaluation March 30, 2016   Authorization Type g codes   Authorization Time Period 2/10   PT Start Time 1030   PT Stop Time 1116   PT Time Calculation (min) 46 min   Equipment Utilized During Treatment Gait belt   Activity Tolerance Patient tolerated treatment well   Behavior During Therapy [facility] for tasks assessed/performed      Past Medical History:  Diagnosis Date  . Ataxia   . COPD (chronic obstructive pulmonary disease) ([place])   . Depression   . Excessive falling   . Hypertension   . Raynaud's disease without gangrene   . Raynaud's disease without gangrene     Past Surgical History:  Procedure Laterality Date  . BACK SURGERY    . KNEE ARTHROSCOPY     partial lateral meniscectomy, debridement, excision of Baker's cyst  . ROTATOR CUFF REPAIR      There were no vitals filed for this visit.      Subjective Assessment - 02/23/16 1035    Subjective Pt reports she is doing pretty good. Her back is a little sore today. She reports the HEP is going well.   Limitations Lifting;Walking   How long can you walk comfortably? hurts "every step"   Patient Stated Goals to build strength before knee surgery   Currently in Pain? Yes   Pain Score 4   L knee   Pain Descriptors / Indicators Aching   Pain Type Chronic pain   Pain Onset More than a month ago   Pain Frequency Intermittent      Therex:  Nustep L1 x2 min (warm up), L3 x 7 min, L1 x1 min (cool down) for LE strengthening and endurance training Cues for foot placement, pushing through heels, and maintaining  SPM 60 or above during L3, and reduced SPM during cool down.   Supine:   Heel slides, SLR, SAQs, hip abd slides, bridges, clamshells with YTB 2x10 each   Standing: Hip SLR, abd, ext, and march with YTB 2x10 each  Seated: LAQs with 4# 2x10  Cues for proper technique of exercises to target specific muscles and slow eccentric contractions for most effective strengthening.                           PT Education - 02/23/16 1037    Education provided Yes   Education Details continue HEP   Person(s) Educated Patient   Methods Explanation;Demonstration   Comprehension Verbalized understanding;Returned demonstration             PT Long Term Goals - 02/21/16 1039      PT LONG TERM GOAL #1   Title Pt will score <15 sec on 5x STS to reduce risk of falls withint 12 weeks.   Baseline 27.08 sec   Time 12   Period Weeks   Status New     PT LONG TERM GOAL #2   Title Pt will score >1.0 m/s on 10 MW for increased functional mobility without AD within 12 weeks.  Baseline 0.67 m/s   Time 12   Period Weeks   Status New     PT LONG TERM GOAL #3   Title Pt will score 10 sec or less on TUG to reduce risk of falls within 12 weeks.   Baseline 15.21 sec1   Time 12   Period Weeks   Status New     PT LONG TERM GOAL #4   Title Pt will improve LEFS score by 9 points for increased function within 12 weeks.   Baseline 36/80   Time 12   Period Weeks   Status New               Plan - 02/23/16 1054    Clinical Impression Statement Pt performed LE strengthening and endurance exercises with no increase in pain or complaints this session. Eccentric control is difficult, but improves with cues. Occasional therapeutic rest breaks for energy conservation. She will benefit from continued skilled PT for increased LE strength and funcitonal mobility.   Rehab Potential Fair   Clinical Impairments Affecting Rehab Potential age, OA, chronic   PT Frequency 2x / week   PT  Duration 12 weeks   PT Treatment/Interventions Cryotherapy;Electrical Stimulation;Moist Heat;Ultrasound;Gait training;DME Lexicographer;Therapeutic activities;Therapeutic exercise;Balance training;Neuromuscular re-education;Patient/family education;Manual techniques   PT Next Visit Plan LE strengthening   PT Home Exercise Plan see pt instructions   Consulted and Agree with Plan of Care Patient      Patient will benefit from skilled therapeutic intervention in order to improve the following deficits and impairments:  Abnormal gait, Decreased balance, Decreased endurance, Decreased strength, Difficulty walking, Impaired sensation, Pain  Visit Diagnosis: Muscle weakness (generalized)  Difficulty in walking, not elsewhere classified  Unsteadiness on feet     Problem List There are no active problems to display for this patient.   Colsen Modi Shiela Mayer, PT, DPT  02/23/16, 11:19 AM [facility] [address] [place], [address] Phone: [phone]   Fax:  [phone]  Name: Melissa [name] MRN: [id number] Date of Birth: 10-19-1940

## 2016-02-24 ENCOUNTER — Encounter: Payer: Self-pay | Admitting: Physical Therapy

## 2016-02-28 ENCOUNTER — Encounter: Payer: Self-pay | Admitting: Physical Therapy

## 2016-02-28 ENCOUNTER — Ambulatory Visit: Payer: Commercial Managed Care - HMO | Admitting: Physical Therapy

## 2016-02-28 DIAGNOSIS — R2681 Unsteadiness on feet: Secondary | ICD-10-CM

## 2016-02-28 DIAGNOSIS — M6281 Muscle weakness (generalized): Secondary | ICD-10-CM

## 2016-02-28 DIAGNOSIS — R262 Difficulty in walking, not elsewhere classified: Secondary | ICD-10-CM | POA: Diagnosis not present

## 2016-02-28 NOTE — Therapy (Signed)
[facility] [address] [place], [address] Phone: [phone]   Fax:  [phone]  Physical Therapy Treatment  Patient Details  Name: Melissa [name] MRN: [id number] Date of Birth: 05-Jul-1941 Referring Provider: Doy Hutching  Encounter Date: 02/28/2016      PT End of Session - 02/28/16 1438    Visit Number 3   Number of Visits 25   Date for PT Re-Evaluation March 30, 2016   Authorization Type g codes   Authorization Time Period 3/10   PT Start Time 1429   PT Stop Time 1514   PT Time Calculation (min) 45 min   Equipment Utilized During Treatment Gait belt   Activity Tolerance Patient tolerated treatment well   Behavior During Therapy [facility] for tasks assessed/performed      Past Medical History:  Diagnosis Date  . Ataxia   . COPD (chronic obstructive pulmonary disease) ([place])   . Depression   . Excessive falling   . Hypertension   . Raynaud's disease without gangrene   . Raynaud's disease without gangrene     Past Surgical History:  Procedure Laterality Date  . BACK SURGERY    . KNEE ARTHROSCOPY     partial lateral meniscectomy, debridement, excision of Baker's cyst  . ROTATOR CUFF REPAIR      There were no vitals filed for this visit.      Subjective Assessment - 02/28/16 1434    Subjective Pt is doing well. She reports HEP is going well. She has had increased discomfort in her baker's cyst of L LE.   Limitations Lifting;Walking   How long can you walk comfortably? hurts "every step"   Patient Stated Goals to build strength before knee surgery   Currently in Pain? Yes   Pain Score 4   L knee   Pain Descriptors / Indicators Aching   Pain Type Chronic pain   Pain Onset More than a month ago      Therex:   Nustep L2 x2 min (warm up), L5 x 5 min, L1 x2 min (cool down) for LE strengthening and endurance training Cues for foot placement, pushing through heels, and maintaining SPM 60 or above during  L5, and reduced SPM during cool down.   Supine:   Heel slides, SLR, SAQs, hip abd slides with 2# 2x10 Bridges 2x10 Clamshells with YTB 2x10 each   Standing: Hip SLR, abd, ext, and march with YTB 2x10 each   Seated: LAQs with 4# 2x10   Cues for proper technique of exercises to target specific muscles and slow eccentric contractions for most effective strengthening.                            PT Education - 02/28/16 1437    Education provided Yes   Education Details continue HEP   Person(s) Educated Patient   Methods Explanation;Demonstration   Comprehension Verbalized understanding;Returned demonstration             PT Long Term Goals - 02/21/16 1039      PT LONG TERM GOAL #1   Title Pt will score <15 sec on 5x STS to reduce risk of falls withint 12 weeks.   Baseline 27.08 sec   Time 12   Period Weeks   Status New     PT LONG TERM GOAL #2   Title Pt will score >1.0 m/s on 10 MW for increased functional mobility without AD  within 12 weeks.   Baseline 0.67 m/s   Time 12   Period Weeks   Status New     PT LONG TERM GOAL #3   Title Pt will score 10 sec or less on TUG to reduce risk of falls within 12 weeks.   Baseline 15.21 sec1   Time 12   Period Weeks   Status New     PT LONG TERM GOAL #4   Title Pt will improve LEFS score by 9 points for increased function within 12 weeks.   Baseline 36/80   Time 12   Period Weeks   Status New               Plan - 02/28/16 1444    Clinical Impression Statement Pt was able to tolerate increased exercise intensity this session. She required minimal cues for proper technique of exercises. Pt will benefit from continued strengthening to increase joint support and mobility with reduced pain in L knee.   Rehab Potential Fair   Clinical Impairments Affecting Rehab Potential age, OA, chronic   PT Frequency 2x / week   PT Duration 12 weeks   PT Treatment/Interventions Cryotherapy;Electrical  Stimulation;Moist Heat;Ultrasound;Gait training;DME Lexicographer;Therapeutic activities;Therapeutic exercise;Balance training;Neuromuscular re-education;Patient/family education;Manual techniques   PT Next Visit Plan LE strengthening   PT Home Exercise Plan see pt instructions   Consulted and Agree with Plan of Care Patient      Patient will benefit from skilled therapeutic intervention in order to improve the following deficits and impairments:  Abnormal gait, Decreased balance, Decreased endurance, Decreased strength, Difficulty walking, Impaired sensation, Pain  Visit Diagnosis: Muscle weakness (generalized)  Difficulty in walking, not elsewhere classified  Unsteadiness on feet     Problem List There are no active problems to display for this patient.   Natividad Schlosser Shiela Mayer, PT, DPT  02/28/16, 3:17 PM [facility] [address] [place], [address] Phone: [phone]   Fax:  [phone]  Name: Melissa [name] MRN: [id number] Date of Birth: Aug 15, 1940

## 2016-02-29 DIAGNOSIS — M810 Age-related osteoporosis without current pathological fracture: Secondary | ICD-10-CM | POA: Diagnosis not present

## 2016-03-01 ENCOUNTER — Ambulatory Visit
Admission: RE | Admit: 2016-03-01 | Discharge: 2016-03-01 | Disposition: A | Discharge status: Home / Self Care | Payer: Commercial Managed Care - HMO | Source: Ambulatory Visit | Attending: Orthopedic Surgery | Admitting: Orthopedic Surgery

## 2016-03-01 DIAGNOSIS — M175 Other unilateral secondary osteoarthritis of knee: Secondary | ICD-10-CM

## 2016-03-01 DIAGNOSIS — M1712 Unilateral primary osteoarthritis, left knee: Secondary | ICD-10-CM | POA: Insufficient documentation

## 2016-03-01 DIAGNOSIS — M179 Osteoarthritis of knee, unspecified: Secondary | ICD-10-CM | POA: Diagnosis not present

## 2016-03-02 ENCOUNTER — Encounter: Payer: Self-pay | Admitting: Physical Therapy

## 2016-03-02 ENCOUNTER — Ambulatory Visit: Payer: Commercial Managed Care - HMO | Admitting: Physical Therapy

## 2016-03-02 DIAGNOSIS — M6281 Muscle weakness (generalized): Secondary | ICD-10-CM | POA: Diagnosis not present

## 2016-03-02 DIAGNOSIS — R2681 Unsteadiness on feet: Secondary | ICD-10-CM | POA: Diagnosis not present

## 2016-03-02 DIAGNOSIS — R262 Difficulty in walking, not elsewhere classified: Secondary | ICD-10-CM | POA: Diagnosis not present

## 2016-03-02 NOTE — Therapy (Signed)
[facility] [address] [place], [address] Phone: [phone]   Fax:  [phone]  Physical Therapy Treatment  Patient Details  Name: Melissa [name] MRN: [id number] Date of Birth: February 03, 1941 Referring Provider: Doy Hutching  Encounter Date: 03/02/2016      PT End of Session - 03/02/16 1620    Visit Number 4   Number of Visits 25   Date for PT Re-Evaluation 05/15/16   Authorization Type 4   Authorization Time Period 10 Dareen Piano)   PT Start Time 1614   PT Stop Time 1705   PT Time Calculation (min) 51 min   Activity Tolerance Patient tolerated treatment well   Behavior During Therapy [facility] for tasks assessed/performed      Past Medical History:  Diagnosis Date  . Ataxia   . COPD (chronic obstructive pulmonary disease) ([place])   . Depression   . Excessive falling   . Hypertension   . Raynaud's disease without gangrene   . Raynaud's disease without gangrene     Past Surgical History:  Procedure Laterality Date  . BACK SURGERY    . KNEE ARTHROSCOPY     partial lateral meniscectomy, debridement, excision of Baker's cyst  . ROTATOR CUFF REPAIR      There were no vitals filed for this visit.      Subjective Assessment - 03/02/16 1617    Subjective Patient reports she is doing exercises as instructed. She has been very busy this week.    Limitations Lifting;Walking   Patient Stated Goals to build strength before knee surgery   Currently in Pain? Yes   Pain Score 3    Pain Location Knee   Pain Orientation Left   Pain Descriptors / Indicators Aching;Sharp   Pain Type Chronic pain   Pain Onset More than a month ago   Pain Frequency Intermittent     Objective: Strength: left LE decreased hip abduction, extension, ER, knee extension and flexion strength as compared to right  Treatment: Therapeutic exercise: patient performed exercises with guidance, verbal and tactile cues and demonstration of  PT: Sitting:  hip adduction with ball and glute sets with 3 second hold x 15 reps hip abduction with resistive band in sitting x 15 reps  roll ball under each foot x 1 min. Multi angle isometric knee flexion x 3 sets each LE Knee flexion with red resistive band x 15 reps each LE Standing: On Airex pad sway forward and back and side to side x 1 min. Each walk forward and backwards at side of treadmill for UE support as needed for balance x 5 reps Side stepping along balance beam airex pad x 5 reps  NuStep x 10 min. Level #2 alternate UE's and LE's each minute (unbilled)  Patient response to treatment: required assistance, moderate cuing to perform all exercise with correct alignment and technique, improved with repetition        PT Education - 03/02/16 1630    Education provided Yes   Education Details HEP: added hip adduction with ball and glute sets, hip abduction with resistive band in sitting, roll ball under each foot x 1 min., standing sway forward, back and side to side, walk forward and backwards at [employer]) Educated Patient   Methods Explanation;Demonstration;Verbal cues;Handout   Comprehension Verbalized understanding;Returned demonstration;Verbal cues required             PT Long Term Goals - 02/21/16 1039  PT LONG TERM GOAL #1   Title Pt will score <15 sec on 5x STS to reduce risk of falls withint 12 weeks.   Baseline 27.08 sec   Time 12   Period Weeks   Status New     PT LONG TERM GOAL #2   Title Pt will score >1.0 m/s on 10 MW for increased functional mobility without AD within 12 weeks.   Baseline 0.67 m/s   Time 12   Period Weeks   Status New     PT LONG TERM GOAL #3   Title Pt will score 10 sec or less on TUG to reduce risk of falls within 12 weeks.   Baseline 15.21 sec1   Time 12   Period Weeks   Status New     PT LONG TERM GOAL #4   Title Pt will improve LEFS score by 9 points for increased function within 12 weeks.    Baseline 36/80   Time 12   Period Weeks   Status New               Plan - 03/02/16 1702    Clinical Impression Statement Patient demonstrated good understanding of home exercises following demonstration, verbal cuing. She continues with pain and weakness in left LE and will benefit from continued physical therapy intervention for strengthening and improving endurance to prepare for surgery. May benefit from estim. For muscle re education left quadriceps next session   Rehab Potential Fair   PT Frequency 2x / week   PT Duration 12 weeks   PT Treatment/Interventions Cryotherapy;Electrical Stimulation;Moist Heat;Ultrasound;Gait training;DME Lexicographer;Therapeutic activities;Therapeutic exercise;Balance training;Neuromuscular re-education;Patient/family education;Manual techniques   PT Next Visit Plan electrical stimulation for left quadriceps, strengthening exercises   PT Home Exercise Plan as outlined in treatment/pt. education      Patient will benefit from skilled therapeutic intervention in order to improve the following deficits and impairments:  Abnormal gait, Decreased balance, Decreased endurance, Decreased strength, Difficulty walking, Impaired sensation, Pain  Visit Diagnosis: Muscle weakness (generalized)  Difficulty in walking, not elsewhere classified     Problem List There are no active problems to display for this patient.   Jomarie Longs PT 03/03/2016, 2:29 PM  [facility] [address], [place], [address] Phone: [phone]   Fax:  [phone]  Name: Melissa [name] MRN: [id number] Date of Birth: 24-Mar-1941

## 2016-03-06 ENCOUNTER — Ambulatory Visit: Payer: Commercial Managed Care - HMO | Admitting: Physical Therapy

## 2016-03-06 ENCOUNTER — Encounter: Payer: Self-pay | Admitting: Physical Therapy

## 2016-03-06 DIAGNOSIS — M6281 Muscle weakness (generalized): Secondary | ICD-10-CM

## 2016-03-06 DIAGNOSIS — R262 Difficulty in walking, not elsewhere classified: Secondary | ICD-10-CM

## 2016-03-06 DIAGNOSIS — R2681 Unsteadiness on feet: Secondary | ICD-10-CM | POA: Diagnosis not present

## 2016-03-06 NOTE — Therapy (Signed)
[facility] [address] [place], [address] Phone: [phone]   Fax:  [phone]  Physical Therapy Treatment  Patient Details  Name: Melissa [name] MRN: [id number] Date of Birth: [birth year] Referring Provider: Doy Hutching  Encounter Date: 03/06/2016      PT End of Session - 03/06/16 1053    Visit Number 5   Number of Visits 25   Date for PT Re-Evaluation 05/15/16   Authorization Type 5   Authorization Time Period 10 Dareen Piano)   PT Start Time 1037   PT Stop Time 1126   PT Time Calculation (min) 49 min   Equipment Utilized During Treatment --   Activity Tolerance Patient tolerated treatment well   Behavior During Therapy [facility] for tasks assessed/performed      Past Medical History:  Diagnosis Date  . Ataxia   . COPD (chronic obstructive pulmonary disease) ([place])   . Depression   . Excessive falling   . Hypertension   . Raynaud's disease without gangrene   . Raynaud's disease without gangrene     Past Surgical History:  Procedure Laterality Date  . BACK SURGERY    . KNEE ARTHROSCOPY     partial lateral meniscectomy, debridement, excision of Baker's cyst  . ROTATOR CUFF REPAIR      There were no vitals filed for this visit.      Subjective Assessment - 03/06/16 1038    Subjective Patient reports no worse than previous session with left LE. She is doing well. She still notices weakness and difficulty with walking due to left leg pain/weakness in knee.   Limitations Lifting;Walking   Patient Stated Goals to build strength before knee surgery   Currently in Pain? Yes   Pain Score 3    Pain Location Neck   Pain Orientation Left   Pain Descriptors / Indicators Aching;Sharp   Pain Type Chronic pain   Pain Onset More than a month ago   Pain Frequency Intermittent       Objective: Gait: ambulating into clinic with brace left knee, mild limp on left LE Observation; decreased quad contraction,  muscle bulk left LE as compared to right  Treatment: Patella mobilization left LE with patella mobilizer x 3 reps for distraction and inferior/superior glides Modalities; Electrical stimulation: russian stim. 10/10 cycle with patient performing SAQ x 3 reps each cycle; (2) electrodes applied to left VMO/quadriceps left LE, intensity to muscle contraction/tolerance, paint long sitting on treatment table with pillow supporting LE x 15 min. Therapeutic exercise: patient performed exercises with guidance, verbal and tactile cues and demonstration of PT:  Standing: On Airex pad sway forward and back and side to side x 1 min. Each walk forward and backwards at side of counter for UE support as needed for balance x 5 reps Side stepping along balance beam airex pad x 5 reps Step ups onto airex pad leading with each LE x 10 with verbal cues for proper sequencing  NuStep x 10 min. Level #3 alternate UE's and LE's each minute (unbilled) (averaged 100 steps/min., .50 mile  Patient response to treatment: Patient required less assistance and support for balance with exercises demonstrating improved strength and proprioception. She required moderate verbal cuing for step ups for proper sequencing and verbal cuing for correct sequencing and technique for backward walking. Technique improved with repetition         PT Education - 03/06/16 1051    Education provided Yes   Education Details  HEP: re assessed home exercises   Person(s) Educated Patient   Methods Explanation;Demonstration;Verbal cues   Comprehension Verbalized understanding;Returned demonstration;Verbal cues required             PT Long Term Goals - 02/21/16 1039      PT LONG TERM GOAL #1   Title Pt will score <15 sec on 5x STS to reduce risk of falls withint 12 weeks.   Baseline 27.08 sec   Time 12   Period Weeks   Status New     PT LONG TERM GOAL #2   Title Pt will score >1.0 m/s on 10 MW for increased functional  mobility without AD within 12 weeks.   Baseline 0.67 m/s   Time 12   Period Weeks   Status New     PT LONG TERM GOAL #3   Title Pt will score 10 sec or less on TUG to reduce risk of falls within 12 weeks.   Baseline 15.21 sec1   Time 12   Period Weeks   Status New     PT LONG TERM GOAL #4   Title Pt will improve LEFS score by 9 points for increased function within 12 weeks.   Baseline 36/80   Time 12   Period Weeks   Status New               Plan - 03/06/16 1054    Clinical Impression Statement Patient demonstrates good response to estim. and is performing exercises with verbal cuing and assistance. She continues with weakness in left LE and will benefit from continued physical therpay intervention for progressive exercises to prepare for surgery.    Rehab Potential Fair   PT Frequency 2x / week   PT Duration 12 weeks   PT Treatment/Interventions Cryotherapy;Electrical Stimulation;Moist Heat;Ultrasound;Gait training;DME Lexicographer;Therapeutic activities;Therapeutic exercise;Balance training;Neuromuscular re-education;Patient/family education;Manual techniques   PT Next Visit Plan electrical stimulation for left quadriceps, strengthening exercises   PT Home Exercise Plan as outlined in treatment/pt. education      Patient will benefit from skilled therapeutic intervention in order to improve the following deficits and impairments:  Abnormal gait, Decreased balance, Decreased endurance, Decreased strength, Difficulty walking, Impaired sensation, Pain  Visit Diagnosis: Muscle weakness (generalized)  Difficulty in walking, not elsewhere classified     Problem List There are no active problems to display for this patient.   Jomarie Longs PT 03/06/2016, 2:34 PM  [facility] [address], [place], [address] Phone: [phone]   Fax:  [phone]  Name: MADEEHA COSTANTINO MRN:  [id number] Date of Birth: Dec 23, 1940

## 2016-03-08 ENCOUNTER — Encounter: Payer: Self-pay | Admitting: Physical Therapy

## 2016-03-08 ENCOUNTER — Ambulatory Visit: Payer: Commercial Managed Care - HMO | Admitting: Physical Therapy

## 2016-03-08 DIAGNOSIS — M6281 Muscle weakness (generalized): Secondary | ICD-10-CM | POA: Diagnosis not present

## 2016-03-08 DIAGNOSIS — R2681 Unsteadiness on feet: Secondary | ICD-10-CM | POA: Diagnosis not present

## 2016-03-08 DIAGNOSIS — R262 Difficulty in walking, not elsewhere classified: Secondary | ICD-10-CM | POA: Diagnosis not present

## 2016-03-09 ENCOUNTER — Encounter: Payer: Self-pay | Admitting: Physical Therapy

## 2016-03-09 NOTE — Therapy (Signed)
[facility] [address], [place], [address] Phone: [phone]   Fax:  [phone]  Physical Therapy Treatment  Patient Details  Name: Melissa [name] MRN: [id number] Date of Birth: [birth year] Referring Provider: Doy Hutching  Encounter Date: 03/08/2016      PT End of Session - 03/08/16 1015    Visit Number 6   Number of Visits 25   Date for PT Re-Evaluation 05/15/16   Authorization Type 6   Authorization Time Period 10 Dareen Piano)   PT Start Time [phone]   PT Stop Time 1035   PT Time Calculation (min) 47 min   Activity Tolerance Patient tolerated treatment well   Behavior During Therapy [facility] for tasks assessed/performed      Past Medical History:  Diagnosis Date  . Ataxia   . COPD (chronic obstructive pulmonary disease) ([place])   . Depression   . Excessive falling   . Hypertension   . Raynaud's disease without gangrene   . Raynaud's disease without gangrene     Past Surgical History:  Procedure Laterality Date  . BACK SURGERY    . KNEE ARTHROSCOPY     partial lateral meniscectomy, debridement, excision of Baker's cyst  . ROTATOR CUFF REPAIR      There were no vitals filed for this visit.      Subjective Assessment - 03/08/16 1013    Subjective mild pain in left knee primarily with walking   Limitations Lifting;Walking   Patient Stated Goals to build strength before knee surgery   Currently in Pain? Yes   Pain Score 2    Pain Orientation Left   Pain Descriptors / Indicators Aching   Pain Type Chronic pain   Pain Onset More than a month ago   Pain Frequency Intermittent      Objective: Gait: ambulating into clinic with brace left knee, mild limp on left LE Observation; decreased quad contraction, muscle bulk left LE as compared to right, improved from previous session  Treatment: Patella mobilization left LE with patella mobilizer x 3 reps for distraction and inferior/superior  glides Modalities; Electrical stimulation: russian stim. 10/10 cycle with patient performing SAQ  With 2# weight on ankle. x 3 reps each cycle; (2) electrodes applied to left VMO/quadriceps left LE, intensity to muscle contraction/tolerance, paint long sitting on treatment table with pillow supporting LE x 15 min. Therapeutic exercise: patient performed exercises with guidance, verbal and tactile cues and demonstration of PT:  Standing: Side stepping on airex balance beam x 3 min. Hip adduction with ball and glute sets in sitting, hip abduction with resistive band in sitting Knee flexion with red resistive band in sitting 2 x 15 reps  NuStep x 10 min. Level #3 alternate UE's and LE's each minute (unbilled) (averaged 100 steps/min., .50 mile  Patient response to treatment:Patient demonstrates good understanding of exercises and able to perform with good technique with minimal cuing. Improved quad firing, contraction with good control with estim. Required cuing to position LE's on Nustep for keeping left knee aligned wiithout IR at hip         PT Education - 03/08/16 1014    Education provided Yes   Education Details HEP: work on Hotel manager quads, added [employer]) Educated Patient   Methods Explanation;Demonstration;Verbal cues   Comprehension Verbalized understanding;Returned demonstration;Verbal cues required             PT Long Term Goals - 02/21/16 1039  PT LONG TERM GOAL #1   Title Pt will score <15 sec on 5x STS to reduce risk of falls withint 12 weeks.   Baseline 27.08 sec   Time 12   Period Weeks   Status New     PT LONG TERM GOAL #2   Title Pt will score >1.0 m/s on 10 MW for increased functional mobility without AD within 12 weeks.   Baseline 0.67 m/s   Time 12   Period Weeks   Status New     PT LONG TERM GOAL #3   Title Pt will score 10 sec or less on TUG to reduce risk of falls within 12 weeks.   Baseline 15.21 sec1   Time 12   Period Weeks    Status New     PT LONG TERM GOAL #4   Title Pt will improve LEFS score by 9 points for increased function within 12 weeks.   Baseline 36/80   Time 12   Period Weeks   Status New               Plan - 03/08/16 1035    Clinical Impression Statement Patient demonsrates improving strength, muscle control and improved bulk in left quadriceps. Pain is primary limiting factor to functional walking, daily tasks and she continues with hip muscle and LE weakness that will require additional physical therapy intervention to prepare for surgery.   Rehab Potential Fair   PT Frequency 2x / week   PT Duration 12 weeks   PT Treatment/Interventions Cryotherapy;Electrical Stimulation;Moist Heat;Ultrasound;Gait training;DME Lexicographer;Therapeutic activities;Therapeutic exercise;Balance training;Neuromuscular re-education;Patient/family education;Manual techniques   PT Next Visit Plan electrical stimulation for left quadriceps, strengthening exercises   PT Home Exercise Plan hip and knee exercises for ROM/strengthening      Patient will benefit from skilled therapeutic intervention in order to improve the following deficits and impairments:  Abnormal gait, Decreased balance, Decreased endurance, Decreased strength, Difficulty walking, Impaired sensation, Pain  Visit Diagnosis: Muscle weakness (generalized)  Difficulty in walking, not elsewhere classified     Problem List There are no active problems to display for this patient.   Jomarie Longs PT 03/09/2016, 10:18 AM  [facility] [address] [place], [address] Phone: [phone]   Fax:  [phone]  Name: Melissa [name] MRN: [id number] Date of Birth: 1941-06-05

## 2016-03-14 ENCOUNTER — Ambulatory Visit: Payer: Commercial Managed Care - HMO | Admitting: Physical Therapy

## 2016-03-14 ENCOUNTER — Encounter: Payer: Self-pay | Admitting: Physical Therapy

## 2016-03-14 DIAGNOSIS — M6281 Muscle weakness (generalized): Secondary | ICD-10-CM | POA: Diagnosis not present

## 2016-03-14 DIAGNOSIS — R262 Difficulty in walking, not elsewhere classified: Secondary | ICD-10-CM | POA: Diagnosis not present

## 2016-03-14 DIAGNOSIS — R2681 Unsteadiness on feet: Secondary | ICD-10-CM | POA: Diagnosis not present

## 2016-03-15 NOTE — Therapy (Signed)
[facility] [address] [place], [address] Phone: [phone]   Fax:  [phone]  Physical Therapy Treatment  Patient Details  Name: Melissa [name] MRN: [id number] Date of Birth: 01-Jun-1941 Referring Provider: Doy Hutching  Encounter Date: 03/14/2016      PT End of Session - 03/14/16 1154    Visit Number 7   Number of Visits 25   Date for PT Re-Evaluation 05/15/16   Authorization Type 7   Authorization Time Period 10 ([employer]code)   PT Start Time 1045   PT Stop Time 1130   PT Time Calculation (min) 45 min   Activity Tolerance Patient tolerated treatment well   Behavior During Therapy [facility] for tasks assessed/performed      Past Medical History:  Diagnosis Date  . Ataxia   . COPD (chronic obstructive pulmonary disease) ([place])   . Depression   . Excessive falling   . Hypertension   . Raynaud's disease without gangrene   . Raynaud's disease without gangrene     Past Surgical History:  Procedure Laterality Date  . BACK SURGERY    . KNEE ARTHROSCOPY     partial lateral meniscectomy, debridement, excision of Baker's cyst  . ROTATOR CUFF REPAIR      There were no vitals filed for this visit.      Subjective Assessment - 03/14/16 1104    Subjective Patient reports she is getting stronger in left leg with current treatment. She has pain with increased walking, weight bearing.    Limitations Lifting;Walking   Patient Stated Goals to build strength before knee surgery   Currently in Pain? Yes   Pain Score 2    Pain Location Neck   Pain Orientation Left   Pain Descriptors / Indicators Aching   Pain Type Chronic pain   Pain Onset More than a month ago       Objective: Gait: ambulating into clinic with brace left knee, mild limp on left LE Observation; decreased quad contraction, muscle bulk left LE as compared to right, improved from previous session  Treatment: Patella mobilization left LE with  patella mobilizer x 3 reps for distraction and inferior/superior glides Modalities; Electrical stimulation: russian stim. 10/10 cycle with patient performing SAQ  With 2# weight on ankle. x 3 reps each cycle; (2) electrodes applied to left VMO/quadriceps left LE, intensity to muscle contraction/tolerance, patient long sitting on treatment table with pillow supporting LE x 20 min. Therapeutic exercise: patient performed exercises with guidance, verbal and tactile cues and demonstration of PT:  Standing: Hip adduction with ball and glute sets in sitting x 15 reps hip abduction with resistive band in sitting, green, x 15 reps Knee flexion with green resistive band in sitting 2 x 15 reps  NuStep x 13 min. Level #3(unbilled) at end of session  Patient response to treatment:Patient with improved quad control and able to perform exercises without increased pain following patella mobilization and russian stim.Patient demonstrates good understanding of exercises and able to perform with good technique with minimal cuing.  Required cuing to position LE's on Nustep to keep left knee aligned without IR at hip        PT Education - 03/14/16 1104    Education provided Yes   Education Details HEP: continue with home exercises for strengthening: SLR, SAQ with 2# weight, hamstring exercise   Person(s) Educated Patient   Methods Explanation;Demonstration;Verbal cues   Comprehension Verbalized understanding;Returned demonstration;Verbal cues required  PT Long Term Goals - 02/21/16 1039      PT LONG TERM GOAL #1   Title Pt will score <15 sec on 5x STS to reduce risk of falls withint 12 weeks.   Baseline 27.08 sec   Time 12   Period Weeks   Status New     PT LONG TERM GOAL #2   Title Pt will score >1.0 m/s on 10 MW for increased functional mobility without AD within 12 weeks.   Baseline 0.67 m/s   Time 12   Period Weeks   Status New     PT LONG TERM GOAL #3   Title Pt will  score 10 sec or less on TUG to reduce risk of falls within 12 weeks.   Baseline 15.21 sec1   Time 12   Period Weeks   Status New     PT LONG TERM GOAL #4   Title Pt will improve LEFS score by 9 points for increased function within 12 weeks.   Baseline 36/80   Time 12   Period Weeks   Status New               Plan - 03/14/16 1142    Clinical Impression Statement Patient demonstrates good progress with strength and endurance left LE with good carry over from session to session. She continues with pain that is primary limiting factor to being able to walk and stand for extended periods of time. She is progressing well with all goals.    Rehab Potential Fair   PT Frequency 2x / week   PT Duration 12 weeks   PT Treatment/Interventions Cryotherapy;Electrical Stimulation;Moist Heat;Ultrasound;Gait training;DME Lexicographer;Therapeutic activities;Therapeutic exercise;Balance training;Neuromuscular re-education;Patient/family education;Manual techniques   PT Next Visit Plan electrical stimulation for left quadriceps, strengthening exercises   PT Home Exercise Plan hip and knee exercises for ROM/strengthening      Patient will benefit from skilled therapeutic intervention in order to improve the following deficits and impairments:  Abnormal gait, Decreased balance, Decreased endurance, Decreased strength, Difficulty walking, Impaired sensation, Pain  Visit Diagnosis: Muscle weakness (generalized)  Difficulty in walking, not elsewhere classified     Problem List There are no active problems to display for this patient.   Jomarie Longs PT 03/15/2016, 2:04 PM  [facility] [address], [place], [address] Phone: [phone]   Fax:  [phone]  Name: Melissa [name] MRN: [id number] Date of Birth: 06-22-1941

## 2016-03-16 ENCOUNTER — Ambulatory Visit: Payer: Commercial Managed Care - HMO | Admitting: Physical Therapy

## 2016-03-16 ENCOUNTER — Encounter: Payer: Self-pay | Admitting: Physical Therapy

## 2016-03-16 DIAGNOSIS — R2681 Unsteadiness on feet: Secondary | ICD-10-CM | POA: Diagnosis not present

## 2016-03-16 DIAGNOSIS — M6281 Muscle weakness (generalized): Secondary | ICD-10-CM

## 2016-03-16 DIAGNOSIS — R262 Difficulty in walking, not elsewhere classified: Secondary | ICD-10-CM

## 2016-03-16 NOTE — Therapy (Signed)
[facility] [address] [place], [address] Phone: [phone]   Fax:  [phone]  Physical Therapy Treatment  Patient Details  Name: Melissa [name] MRN: [id number] Date of Birth: 08/17/1940 Referring Provider: Doy Hutching  Encounter Date: 03/16/2016      PT End of Session - 03/16/16 1200    Visit Number 8   Number of Visits 25   Date for PT Re-Evaluation 05/15/16   Authorization Type 8   Authorization Time Period 10 Dareen Piano)   PT Start Time 1107   PT Stop Time 1155   PT Time Calculation (min) 48 min   Activity Tolerance Patient tolerated treatment well   Behavior During Therapy [facility] for tasks assessed/performed      Past Medical History:  Diagnosis Date  . Ataxia   . COPD (chronic obstructive pulmonary disease) ([place])   . Depression   . Excessive falling   . Hypertension   . Raynaud's disease without gangrene   . Raynaud's disease without gangrene     Past Surgical History:  Procedure Laterality Date  . BACK SURGERY    . KNEE ARTHROSCOPY     partial lateral meniscectomy, debridement, excision of Baker's cyst  . ROTATOR CUFF REPAIR      There were no vitals filed for this visit.      Subjective Assessment - 03/16/16 1127    Subjective Patient reports she is getting stronger in left leg with current treatment. She continues with increased left knee pain with  walking, weight bearing activities.    Limitations Lifting;Walking   How long can you walk comfortably? still hurts "every step" and worse if she places foot the wrong way, better when wearing brace.   Patient Stated Goals to build strength before knee surgery   Currently in Pain? Yes   Pain Score 2    Pain Location Knee   Pain Orientation Left   Pain Descriptors / Indicators Aching   Pain Type Chronic pain   Pain Onset More than a month ago      Objective: Gait: ambulating into clinic with brace left knee, mild limp on left  LE Observation; decreased quad contraction, muscle bulk left LE as compared to right, improved from previous session  Treatment: Patella mobilization left LE with patella mobilizer x 3 reps for distraction and inferior/superior glides Modalities; Electrical stimulation: russian stim. 10/10 cycle with patient performing SAQ With 3# weight on ankle. x 3 reps each cycle; (2) electrodes applied to left VMO/quadriceps left LE, intensity to muscle contraction/tolerance, patient long sitting on treatment table with pillow supporting LE x 20 min. Therapeutic exercise: patient performed exercises with guidance, verbal and tactile cues and demonstration of PT:  Sitting: LAQ with 3# on ankle x 15 reps Hip adduction with 2#  ball and glute sets in sitting x 15 reps hip abduction with resistive band in sitting, green, x 25 reps Knee flexion with green resistive band in sitting 2 x 15 reps  NuStep x 14 min. Level #3/4 (unbilled) at end of session  Patient response to treatment:Patient required minimal cuing for correct alignment of hip/knee with all exercises and NuStep. She demonstrates improved quad control following [place] stimulation.P       PT Long Term Goals - 02/21/16 1039      PT LONG TERM GOAL #1   Title Pt will score <15 sec on 5x STS to reduce risk of falls withint 12 weeks.   Baseline 27.08 sec  Time 12   Period Weeks   Status New     PT LONG TERM GOAL #2   Title Pt will score >1.0 m/s on 10 MW for increased functional mobility without AD within 12 weeks.   Baseline 0.67 m/s   Time 12   Period Weeks   Status New     PT LONG TERM GOAL #3   Title Pt will score 10 sec or less on TUG to reduce risk of falls within 12 weeks.   Baseline 15.21 sec1   Time 12   Period Weeks   Status New     PT LONG TERM GOAL #4   Title Pt will improve LEFS score by 9 points for increased function within 12 weeks.   Baseline 36/80   Time 12   Period Weeks   Status New                Plan - 03/16/16 1200    Clinical Impression Statement Patient is progressing well towards all goals and preparing for TKA surgery in the next few weeks. She continues with primary limiting factor of pain and will continue to benefit from additional physical therapy intervention to perpare for surgery.   Rehab Potential Fair   PT Frequency 2x / week   PT Duration 12 weeks   PT Treatment/Interventions Cryotherapy;Electrical Stimulation;Moist Heat;Ultrasound;Gait training;DME Lexicographer;Therapeutic activities;Therapeutic exercise;Balance training;Neuromuscular re-education;Patient/family education;Manual techniques   PT Next Visit Plan electrical stimulation for left quadriceps, strengthening exercises   PT Home Exercise Plan hip and knee exercises for ROM/strengthening      Patient will benefit from skilled therapeutic intervention in order to improve the following deficits and impairments:  Abnormal gait, Decreased balance, Decreased endurance, Decreased strength, Difficulty walking, Impaired sensation, Pain  Visit Diagnosis: Muscle weakness (generalized)  Difficulty in walking, not elsewhere classified     Problem List There are no active problems to display for this patient.   Jomarie Longs PT 03/16/2016, 6:32 PM  [facility] [address], [place], [address] Phone: [phone]   Fax:  [phone]  Name: CRESSIDA MILFORD MRN: [id number] Date of Birth: Oct 09, 1940

## 2016-03-21 ENCOUNTER — Ambulatory Visit: Payer: Commercial Managed Care - HMO | Attending: Internal Medicine | Admitting: Physical Therapy

## 2016-03-21 ENCOUNTER — Encounter: Payer: Self-pay | Admitting: Physical Therapy

## 2016-03-21 DIAGNOSIS — M6281 Muscle weakness (generalized): Secondary | ICD-10-CM | POA: Diagnosis not present

## 2016-03-21 DIAGNOSIS — R262 Difficulty in walking, not elsewhere classified: Secondary | ICD-10-CM | POA: Diagnosis not present

## 2016-03-21 NOTE — Therapy (Signed)
[facility] [address], [place], [address] Phone: [phone]   Fax:  [phone]  Physical Therapy Treatment  Patient Details  Name: Melissa [name] MRN: [id number] Date of Birth: [birth year] Referring Provider: Doy Hutching  Encounter Date: 03/21/2016      PT End of Session - 03/21/16 0919    Visit Number 9   Number of Visits 25   Date for PT Re-Evaluation 05/15/16   Authorization Type 9   Authorization Time Period 10 Dareen Piano)   PT Start Time 0906   PT Stop Time 0952   PT Time Calculation (min) 46 min   Activity Tolerance Patient tolerated treatment well   Behavior During Therapy [facility] for tasks assessed/performed      Past Medical History:  Diagnosis Date  . Ataxia   . COPD (chronic obstructive pulmonary disease) ([place])   . Depression   . Excessive falling   . Hypertension   . Raynaud's disease without gangrene   . Raynaud's disease without gangrene     Past Surgical History:  Procedure Laterality Date  . BACK SURGERY    . KNEE ARTHROSCOPY     partial lateral meniscectomy, debridement, excision of Baker's cyst  . ROTATOR CUFF REPAIR      There were no vitals filed for this visit.      Subjective Assessment - 03/21/16 0916    Subjective Patient reports she is getting stronger in left leg with current treatment. She has pain with increased walking, weight bearing.    Limitations Lifting;Walking   Patient Stated Goals to build strength before knee surgery   Currently in Pain? Yes   Pain Score 3    Pain Location Knee   Pain Orientation Left   Pain Descriptors / Indicators Aching   Pain Type Chronic pain   Pain Onset More than a month ago      Objective: Gait: brace left knee, mild limp left LE, independent without AD Observation; improving muscle control with quad set left LE, continues with decreased muscle bulk as compare to right LE  Treatment: Patella mobilization left LE with patella  mobilizer x 3 reps for distraction and inferior/superior glides Modalities; Electrical stimulation: russian stim. 10/10 cycle with patient performing SAQ With 3# weight on ankle. x 3 reps each cycle; (2) electrodes applied to left VMO/quadriceps left LE, intensity to muscle contraction/tolerance, patientlong sitting on treatment table with pillow supporting LE x 10mn.  Therapeutic exercise: patient performed exercises with guidance, verbal and tactile cues and demonstration of PT:  Sitting: LAQ with 3# on ankle 2 x 15 reps Hip adduction with ball and glute sets in sitting x 15 reps hip abduction with resistive band in sitting, green, x 25 reps Knee flexion with greenresistive band in sitting 2 x 15 reps Standing side step along counter x 10 reps Standing hip abduction and extension 2 x 5 reps with verbal cues and demonstration  NuStep x 171m. Level #3 (unbilled) at end of session  Patient response to treatment:Patieint advancing exercises with good muscae control as demonstrated with standing hip abduction and extension with resistive band today. She demonstrated improved gait pattern following estim.          PT Education - 03/21/16 [phone]  Education provided Yes   Education Details HEP: continue with exercises as instructed   Person(s) Educated Patient   Methods Explanation;Demonstration;Verbal cues   Comprehension Verbalized understanding;Returned demonstration;Verbal cues required  PT Long Term Goals - 02/21/16 1039      PT LONG TERM GOAL #1   Title Pt will score <15 sec on 5x STS to reduce risk of falls withint 12 weeks.   Baseline 27.08 sec   Time 12   Period Weeks   Status New     PT LONG TERM GOAL #2   Title Pt will score >1.0 m/s on 10 MW for increased functional mobility without AD within 12 weeks.   Baseline 0.67 m/s   Time 12   Period Weeks   Status New     PT LONG TERM GOAL #3   Title Pt will score 10 sec or less on TUG to reduce  risk of falls within 12 weeks.   Baseline 15.21 sec1   Time 12   Period Weeks   Status New     PT LONG TERM GOAL #4   Title Pt will improve LEFS score by 9 points for increased function within 12 weeks.   Baseline 36/80   Time 12   Period Weeks   Status New               Plan - 03/21/16 0936    Clinical Impression Statement Patieint demonstrates improving strength and endurance with all exercises and is progressing with exercises with minimal verbal cuing. She continues with pain with weight bearing and walking and will benefit from continued physical therapy to prepare for surgery.   Rehab Potential Fair   PT Frequency 2x / week   PT Duration 12 weeks   PT Treatment/Interventions Cryotherapy;Electrical Stimulation;Moist Heat;Ultrasound;Gait training;DME Lexicographer;Therapeutic activities;Therapeutic exercise;Balance training;Neuromuscular re-education;Patient/family education;Manual techniques   PT Next Visit Plan electrical stimulation for left quadriceps, strengthening exercises   PT Home Exercise Plan hip and knee exercises for ROM/strengthening      Patient will benefit from skilled therapeutic intervention in order to improve the following deficits and impairments:  Abnormal gait, Decreased balance, Decreased endurance, Decreased strength, Difficulty walking, Impaired sensation, Pain  Visit Diagnosis: Muscle weakness (generalized)  Difficulty in walking, not elsewhere classified     Problem List There are no active problems to display for this patient.   Jomarie Longs PT 03/21/2016, 3:47 PM  [facility] [address], [place], [address] Phone: [phone]   Fax:  [phone]  Name: WENDA VANSCHAICK MRN: [id number] Date of Birth: 1941-02-14

## 2016-03-22 ENCOUNTER — Encounter
Admission: RE | Admit: 2016-03-22 | Discharge: 2016-03-22 | Disposition: A | Discharge status: Home / Self Care | Payer: Commercial Managed Care - HMO | Source: Ambulatory Visit | Attending: Orthopedic Surgery | Admitting: Orthopedic Surgery

## 2016-03-22 DIAGNOSIS — Z01812 Encounter for preprocedural laboratory examination: Secondary | ICD-10-CM | POA: Insufficient documentation

## 2016-03-22 DIAGNOSIS — Z01810 Encounter for preprocedural cardiovascular examination: Secondary | ICD-10-CM | POA: Diagnosis not present

## 2016-03-22 DIAGNOSIS — I10 Essential (primary) hypertension: Secondary | ICD-10-CM | POA: Diagnosis not present

## 2016-03-22 HISTORY — DX: Diverticulosis of intestine, part unspecified, without perforation or abscess without bleeding: K57.90

## 2016-03-22 HISTORY — DX: Reserved for inherently not codable concepts without codable children: IMO0001

## 2016-03-22 HISTORY — DX: Unspecified osteoarthritis, unspecified site: M19.90

## 2016-03-22 HISTORY — DX: Anemia, unspecified: D64.9

## 2016-03-22 HISTORY — DX: Hyperlipidemia, unspecified: E78.5

## 2016-03-22 HISTORY — DX: Gastro-esophageal reflux disease without esophagitis: K21.9

## 2016-03-22 HISTORY — DX: Anxiety disorder, unspecified: F41.9

## 2016-03-22 HISTORY — DX: Psoriasis, unspecified: L40.9

## 2016-03-22 HISTORY — DX: Irritable bowel syndrome, unspecified: K58.9

## 2016-03-22 LAB — URINALYSIS COMPLETE WITH MICROSCOPIC (ARMC ONLY)
BACTERIA UA: NONE SEEN
Bilirubin Urine: NEGATIVE
GLUCOSE, UA: NEGATIVE mg/dL
Hgb urine dipstick: NEGATIVE
Ketones, ur: NEGATIVE mg/dL
NITRITE: NEGATIVE
PROTEIN: NEGATIVE mg/dL
SPECIFIC GRAVITY, URINE: 1.017 (ref 1.005–1.030)
SQUAMOUS EPITHELIAL / LPF: NONE SEEN
pH: 5 (ref 5.0–8.0)

## 2016-03-22 LAB — APTT: aPTT: 36 seconds (ref 24–36)

## 2016-03-22 LAB — CBC
HEMATOCRIT: 35.3 % (ref 35.0–47.0)
Hemoglobin: 12.1 g/dL (ref 12.0–16.0)
MCH: 32.1 pg (ref 26.0–34.0)
MCHC: 34.3 g/dL (ref 32.0–36.0)
MCV: 93.6 fL (ref 80.0–100.0)
Platelets: 276 10*3/uL (ref 150–440)
RBC: 3.77 MIL/uL — ABNORMAL LOW (ref 3.80–5.20)
RDW: 14.1 % (ref 11.5–14.5)
WBC: 9.6 10*3/uL (ref 3.6–11.0)

## 2016-03-22 LAB — SURGICAL PCR SCREEN
MRSA, PCR: NEGATIVE
STAPHYLOCOCCUS AUREUS: POSITIVE — AB

## 2016-03-22 LAB — BASIC METABOLIC PANEL
Anion gap: 10 (ref 5–15)
BUN: 25 mg/dL — AB (ref 6–20)
CALCIUM: 9.2 mg/dL (ref 8.9–10.3)
CHLORIDE: 107 mmol/L (ref 101–111)
CO2: 23 mmol/L (ref 22–32)
CREATININE: 0.69 mg/dL (ref 0.44–1.00)
GFR calc non Af Amer: >60 mL/min (ref >60)
Glucose, Bld: 189 mg/dL — ABNORMAL HIGH (ref 65–99)
Potassium: 3.8 mmol/L (ref 3.5–5.1)
Sodium: 140 mmol/L (ref 135–145)

## 2016-03-22 LAB — SEDIMENTATION RATE: SED RATE: 42 mm/h — AB (ref 0–30)

## 2016-03-22 LAB — TYPE AND SCREEN
ABO/RH(D): A POS
ANTIBODY SCREEN: NEGATIVE

## 2016-03-22 LAB — PROTIME-INR
INR: 1.06
Prothrombin Time: 13.8 seconds (ref 11.4–15.2)

## 2016-03-22 NOTE — Patient Instructions (Signed)
  Your procedure is scheduled JU:VQQUIVHOY 19 2017 (Tuesday) Report to Same Day Surgery 2nd floor [facility] To find out your arrival time please call [phone] between 1PM - 3PM on April 03, 2016 (Monday)  Remember: Instructions that are not followed completely may result in serious medical risk, up to and including death, or upon the discretion of your surgeon and anesthesiologist your surgery may need to be rescheduled.    _x___ 1. Do not eat food or drink liquids after midnight. No gum chewing or hard candies.     _x_ 2. No Alcohol for 24 hours before or after surgery.   _x__3. No Smoking for 24 prior to surgery.   ____  4. Bring all medications with you on the day of surgery if instructed.    __x__ 5. Notify your doctor if there is any change in your medical condition     (cold, fever, infections).     Do not wear jewelry, make-up, hairpins, clips or nail polish.  Do not wear lotions, powders, or perfumes. You may wear deodorant.  Do not shave 48 hours prior to surgery. Men may shave face and neck.  Do not bring valuables to the hospital.    [facility] is not responsible for any belongings or valuables.               Contacts, dentures or bridgework may not be worn into surgery.  Leave your suitcase in the car. After surgery it may be brought to your room.  For patients admitted to the hospital, discharge time is determined by your treatment team.   Patients discharged the day of surgery will not be allowed to drive home.    Please read over the following fact sheets that you were given:   [facility] Preparing for Surgery and or MRSA Information   _x___ Take these medicines the morning of surgery with A SIP OF WATER:    1. DILTIAZEM  2. OMEPRAZOLE  3. GEMFIBROZIL  4. PREDNISONE  5. LOSARTAN  6.  ____ Fleet Enema (as directed)   _x___ Use CHG Soap or sage wipes as directed on instruction sheet   ____ Use inhalers on the day of surgery and bring to  hospital day of surgery  ____ Stop metformin 2 days prior to surgery    ____ Take 1/2 of usual insulin dose the night before surgery and none on the morning of surgery.             __x__ Stop aspirin or coumadin, or plavix (STOP ASPIRIN ONE WEEK PRIOR TO SURGERY)  _x__ Stop Anti-inflammatories such as Advil, Aleve, Ibuprofen, Motrin, Naproxen,          Naprosyn, Goodies powders or aspirin products. Ok to take Tylenol.   _x___ Stop supplements until after surgery.  (STOP PRESERVISION ONE WEEK PRIOR TO SURGERY)  ____ Bring C-Pap to the hospital.

## 2016-03-23 ENCOUNTER — Encounter: Payer: Self-pay | Admitting: Physical Therapy

## 2016-03-23 ENCOUNTER — Ambulatory Visit: Payer: Commercial Managed Care - HMO | Admitting: Physical Therapy

## 2016-03-23 DIAGNOSIS — M6281 Muscle weakness (generalized): Secondary | ICD-10-CM | POA: Diagnosis not present

## 2016-03-23 DIAGNOSIS — R262 Difficulty in walking, not elsewhere classified: Secondary | ICD-10-CM

## 2016-03-23 LAB — URINE CULTURE

## 2016-03-24 NOTE — Therapy (Signed)
[facility] [address] [place], [address] Phone: [phone]   Fax:  [phone]  Physical Therapy Treatment  Patient Details  Name: Melissa [name] MRN: [id number] Date of Birth: 02/26/1941 Referring Provider: Doy Hutching  Encounter Date: 03/23/2016      PT End of Session - 03/23/16 0926    Visit Number 10   Number of Visits 25   Date for PT Re-Evaluation 05/15/16   Authorization Type 10   Authorization Time Period 10 Dareen Piano)   PT Start Time [phone]   PT Stop Time 0950   PT Time Calculation (min) 47 min   Activity Tolerance Patient tolerated treatment well   Behavior During Therapy [facility] for tasks assessed/performed      Past Medical History:  Diagnosis Date  . Anemia   . Anxiety   . Arthritis   . Ataxia   . COPD (chronic obstructive pulmonary disease) ([place])   . Depression   . Diverticulosis   . Excessive falling   . GERD (gastroesophageal reflux disease)   . Hyperlipidemia   . Hypertension   . IBS (irritable bowel syndrome)   . Osteoarthritis   . Psoriasis   . Raynaud's disease without gangrene   . Raynaud's disease without gangrene   . Shortness of breath dyspnea    with exertion    Past Surgical History:  Procedure Laterality Date  . ABDOMINAL HYSTERECTOMY    . BACK SURGERY     Spinal fusion  . CHOLECYSTECTOMY    . EYE SURGERY Bilateral    Cataract Extraction with IOL  . KNEE ARTHROSCOPY Left    partial lateral meniscectomy, debridement, excision of Baker's cyst  . ROTATOR CUFF REPAIR Right     There were no vitals filed for this visit.      Subjective Assessment - 03/23/16 0921    Subjective Patient reports she is getting stronger in left leg with current treatment. She has pain with increased walking, weight bearing.    Limitations Lifting;Walking   How long can you walk comfortably? still hurts "every step" and worse if she places foot the wrong way, better when wearing brace.   Patient Stated Goals to build strength before knee surgery   Currently in Pain? Yes   Pain Score 3    Pain Location Knee   Pain Orientation Left   Pain Descriptors / Indicators Aching   Pain Type Chronic pain   Pain Onset More than a month ago   Pain Frequency Intermittent      Objective: Gait: brace left knee, mild limp left LE, independent without AD Observation; improving muscle control with quad set left LE, continues with decreased muscle bulk as compare to right LE  Treatment: Patella mobilization left LE with patella mobilizer x 3 reps for distraction and inferior/superior glides Modalities; Electrical stimulation: russian stim. 10/10 cycle with patient performing SAQ With 3# weight on ankle. x 3 reps each cycle; (2) electrodes applied to left VMO/quadriceps left LE, intensity to muscle contraction/tolerance, patientlong sitting on treatment table with pillow supporting LE x 46mn.  Therapeutic exercise: patient performed exercises with guidance, verbal and tactile cues and demonstration of PT:  Sitting: hip abduction with resistive band in sitting, green, x 25 reps Knee flexion with greenresistive band in sitting 2 x 15 reps Re instructed in quad sets, exercises to be performed at home to prepare for knee TKA, positioning of knee to encourage full extension following surgery  NuStep x 161m.  Level #3 (unbilled) at end of session  Patient response to treatment:Patient demonstrated good technique and understanding of all exercises with minimal cuing, demonstration. improved muscle control noted left LE following estim.        PT Education - 2016-03-30 [phone]    Education provided Yes   Education Details HEP: instructed in home program to continue to prepare for surgery: knee flexion/extension, hip adduction, abduction/ER and quad sets/SLR   Person(s) Educated Patient   Methods Explanation;Demonstration;Verbal cues   Comprehension Verbalized understanding;Returned  demonstration;Verbal cues required             PT Long Term Goals - March 30, 2016 1121      PT LONG TERM GOAL #1   Title Pt will score <15 sec on 5x STS to reduce risk of falls withint 12 weeks.   Baseline IE: 27.o8 sec; 2016/03/30 25 seconds   Status On-going     PT LONG TERM GOAL #2   Title Pt will score >1.0 m/s on 10 MW for increased functional mobility without AD within 12 weeks.   Baseline 0.67 m/s   Status Deferred     PT LONG TERM GOAL #3   Title Pt will score 10 sec or less on TUG to reduce risk of falls within 12 weeks.   Baseline 15.21 sec1   Status On-going     PT LONG TERM GOAL #4   Title Pt will improve LEFS score by 9 points for increased function within 12 weeks.   Baseline 36/80   Status On-going               Plan - 2016-03-30 0927    Clinical Impression Statement Patient is progressing well with all goals. Pain is primary limiting factor to being able to walk and weight bear through left LE/knee. she requires minimal cuing for correct alignment of hip/knee for most exercises and is progressing with self managment of exercises at home to prepare for knee replacement surgery.   Rehab Potential Fair   Clinical Impairments Affecting Rehab Potential age, OA, chronic   PT Frequency 2x / week   PT Duration 12 weeks   PT Treatment/Interventions Cryotherapy;Electrical Stimulation;Moist Heat;Ultrasound;Gait training;DME Lexicographer;Therapeutic activities;Therapeutic exercise;Balance training;Neuromuscular re-education;Patient/family education;Manual techniques   PT Next Visit Plan electrical stimulation for left quadriceps, strengthening exercises   PT Home Exercise Plan hip and knee exercises for ROM/strengthening      Patient will benefit from skilled therapeutic intervention in order to improve the following deficits and impairments:  Abnormal gait, Decreased balance, Decreased endurance, Decreased strength, Difficulty walking, Impaired sensation,  Pain  Visit Diagnosis: Muscle weakness (generalized)  Difficulty in walking, not elsewhere classified       G-Codes - 2016-03-30 1122    Functional Assessment Tool Used 5x STS, 10 MW,, TUG, LEFS, pain, strength deficits, clinical judgment   Functional Limitation Mobility: Walking and moving around   Mobility: Walking and Moving Around Current Status (C9470) At least 40 percent but less than 60 percent impaired, limited or restricted   Mobility: Walking and Moving Around Goal Status [phone]) At least 1 percent but less than 20 percent impaired, limited or restricted      Problem List There are no active problems to display for this patient.   Jomarie Longs PT 03/24/2016, 11:25 AM  [facility] [address] [place], [address] Phone: [phone]   Fax:  [phone]  Name: ALLYSE FREGEAU MRN: [id number] Date of Birth: 25-Aug-1940

## 2016-03-24 NOTE — Pre-Procedure Instructions (Signed)
Urine culture results sent to Dr. Rudene Christians for review.

## 2016-03-28 ENCOUNTER — Ambulatory Visit: Payer: Commercial Managed Care - HMO | Admitting: Physical Therapy

## 2016-03-28 ENCOUNTER — Encounter: Payer: Self-pay | Admitting: Physical Therapy

## 2016-03-28 DIAGNOSIS — M6281 Muscle weakness (generalized): Secondary | ICD-10-CM | POA: Diagnosis not present

## 2016-03-28 DIAGNOSIS — R262 Difficulty in walking, not elsewhere classified: Secondary | ICD-10-CM | POA: Diagnosis not present

## 2016-03-28 NOTE — Therapy (Signed)
[facility] [address] [place], [address] Phone: [phone]   Fax:  [phone]  Physical Therapy Treatment  Patient Details  Name: Melissa [name] MRN: [id number] Date of Birth: [birth year] Referring Provider: Doy Hutching  Encounter Date: 03/28/2016      PT End of Session - 03/28/16 0902    Visit Number 11   Number of Visits 25   Date for PT Re-Evaluation 05/15/16   Authorization Type 11   Authorization Time Period 20 Dareen Piano)   PT Start Time 0848   PT Stop Time 0945   PT Time Calculation (min) 57 min   Activity Tolerance Patient tolerated treatment well   Behavior During Therapy [facility] for tasks assessed/performed      Past Medical History:  Diagnosis Date  . Anemia   . Anxiety   . Arthritis   . Ataxia   . COPD (chronic obstructive pulmonary disease) ([place])   . Depression   . Diverticulosis   . Excessive falling   . GERD (gastroesophageal reflux disease)   . Hyperlipidemia   . Hypertension   . IBS (irritable bowel syndrome)   . Osteoarthritis   . Psoriasis   . Raynaud's disease without gangrene   . Raynaud's disease without gangrene   . Shortness of breath dyspnea    with exertion    Past Surgical History:  Procedure Laterality Date  . ABDOMINAL HYSTERECTOMY    . BACK SURGERY     Spinal fusion  . CHOLECYSTECTOMY    . EYE SURGERY Bilateral    Cataract Extraction with IOL  . KNEE ARTHROSCOPY Left    partial lateral meniscectomy, debridement, excision of Baker's cyst  . ROTATOR CUFF REPAIR Right     There were no vitals filed for this visit.      Subjective Assessment - 03/28/16 0848    Subjective Patient is preparing for surgery, left TKA next week. She is sore in her left LE and it still bothers her with increased walking and standing.    Limitations Lifting;Walking   Patient Stated Goals to build strength before knee surgery   Currently in Pain? Yes   Pain Score 2    Pain Location  Knee   Pain Orientation Left   Pain Descriptors / Indicators Aching   Pain Type Chronic pain   Pain Onset More than a month ago   Pain Frequency Intermittent      Objective: Gait: ambulating independently with brace on left knee without AD Observation; Muscle control left LE quadriceps improved from previous session  Treatment: Patella mobilization left LE with patella mobilizer x 3 reps for distraction and inferior/superior glides Modalities; Electrical stimulation: russian stim. 10/10 cycle with patient performing SAQ With 3# weight on ankle. x 3 reps each cycle; (2) electrodes applied to left VMO/quadriceps left LE, intensity to muscle contraction/tolerance, patientlong sitting on treatment table with pillow supporting LE x 58mn.  Therapeutic exercise: patient performed exercises with guidance, verbal and tactile cues and demonstration of PT:  Sitting: hip abduction with resistive band in sitting, green, x 20 reps Knee flexion with greenresistive band in sitting 1 x 12 reps Standing side stepping along airex balance beam x 2 min. With resistive band around thighs Standing hip abduction and extension with resistive band around thighs 3 sets x 3 reps each Diagonal wedding march x 3 sets 20 feet with demonstration and verbal cuing  NuStep x 157m. Level #3 (unbilled) at end of session  Patient  response to treatment:Paitent with improved quad control following electrical stimulation. She demonstrated good technique with all exercises with minimal verbal cuing.       PT Education - 03/28/16 0902    Education provided Yes   Education Details HEP: reviewed exercises for strengthening and ROM to prepare for surgery   Person(s) Educated Patient   Methods Explanation;Demonstration;Verbal cues   Comprehension Verbalized understanding;Returned demonstration;Verbal cues required             PT Long Term Goals - 03/23/16 1121      PT LONG TERM GOAL #1   Title Pt  will score <15 sec on 5x STS to reduce risk of falls withint 12 weeks.   Baseline IE: 27.o8 sec; 03/23/16 25 seconds   Status On-going     PT LONG TERM GOAL #2   Title Pt will score >1.0 m/s on 10 MW for increased functional mobility without AD within 12 weeks.   Baseline 0.67 m/s   Status Deferred     PT LONG TERM GOAL #3   Title Pt will score 10 sec or less on TUG to reduce risk of falls within 12 weeks.   Baseline 15.21 sec1   Status On-going     PT LONG TERM GOAL #4   Title Pt will improve LEFS score by 9 points for increased function within 12 weeks.   Baseline 36/80   Status On-going               Plan - 03/28/16 0904    Clinical Impression Statement Patient is progressing well towards all goals and is responding well to current therapy. She continues with mild pain in left knee and is compliant with home exercise program for strengthening.    Rehab Potential Fair   PT Frequency 2x / week   PT Duration 12 weeks   PT Treatment/Interventions Cryotherapy;Electrical Stimulation;Moist Heat;Ultrasound;Gait training;DME Lexicographer;Therapeutic activities;Therapeutic exercise;Balance training;Neuromuscular re-education;Patient/family education;Manual techniques   PT Next Visit Plan electrical stimulation for left quadriceps, strengthening exercises, re assess LEFS, anticipate discharge   PT Home Exercise Plan hip and knee exercises for ROM/strengthening      Patient will benefit from skilled therapeutic intervention in order to improve the following deficits and impairments:  Abnormal gait, Decreased balance, Decreased endurance, Decreased strength, Difficulty walking, Impaired sensation, Pain  Visit Diagnosis: Muscle weakness (generalized)  Difficulty in walking, not elsewhere classified     Problem List There are no active problems to display for this patient.   Jomarie Longs PT 03/28/2016, 11:22 AM  [place]  [facility] [address], [place], [address] Phone: [phone]   Fax:  [phone]  Name: Melissa [name] MRN: [id number] Date of Birth: 1940-11-28

## 2016-03-30 ENCOUNTER — Ambulatory Visit: Payer: Commercial Managed Care - HMO | Admitting: Physical Therapy

## 2016-03-30 ENCOUNTER — Encounter: Payer: Self-pay | Admitting: Physical Therapy

## 2016-03-30 DIAGNOSIS — M6281 Muscle weakness (generalized): Secondary | ICD-10-CM | POA: Diagnosis not present

## 2016-03-30 DIAGNOSIS — R262 Difficulty in walking, not elsewhere classified: Secondary | ICD-10-CM | POA: Diagnosis not present

## 2016-03-31 NOTE — Therapy (Signed)
[facility] [address], [place], [address] Phone: [phone]   Fax:  [phone]  Physical Therapy Treatment and Discharge Summary  Patient Details  Name: Melissa [name] MRN: [id number] Date of Birth: 09/09/1971 Referring Provider: Doy Hutching  Encounter Date: 03/30/2016   Patient began physical therapy 02/21/2016 and attended 12 sessions through 03/30/2016 and partially achieved all goals. She is being discharged from physical therapy at this time due to scheduled for TKA left knee next week.      PT End of Session - 03/30/16 0956    Visit Number 12   Number of Visits 25   Date for PT Re-Evaluation 05/15/16   Authorization Type 12   Authorization Time Period 20 Dareen Piano)   PT Start Time 0902   PT Stop Time [phone]   PT Time Calculation (min) 51 min   Activity Tolerance Patient tolerated treatment well   Behavior During Therapy [employer] for tasks assessed/performed      Past Medical History:  Diagnosis Date  . Anemia   . Anxiety   . Arthritis   . Ataxia   . COPD (chronic obstructive pulmonary disease) ([place])   . Depression   . Diverticulosis   . Excessive falling   . GERD (gastroesophageal reflux disease)   . Hyperlipidemia   . Hypertension   . IBS (irritable bowel syndrome)   . Osteoarthritis   . Psoriasis   . Raynaud's disease without gangrene   . Raynaud's disease without gangrene   . Shortness of breath dyspnea    with exertion    Past Surgical History:  Procedure Laterality Date  . ABDOMINAL HYSTERECTOMY    . BACK SURGERY     Spinal fusion  . CHOLECYSTECTOMY    . EYE SURGERY Bilateral    Cataract Extraction with IOL  . KNEE ARTHROSCOPY Left    partial lateral meniscectomy, debridement, excision of Baker's cyst  . ROTATOR CUFF REPAIR Right     There were no vitals filed for this visit.      Subjective Assessment - 03/30/16 0906    Subjective Patient is preparing for surgery, left TKA  next week. She is sore in her left LE and it still bothers her with increased walking and standing. She has been more active preparing her home and getting ready for surgery.   Limitations Lifting;Walking   Patient Stated Goals to build strength before knee surgery   Currently in Pain? Yes   Pain Score 2    Pain Location Knee   Pain Orientation Left   Pain Descriptors / Indicators Aching   Pain Type Chronic pain   Pain Onset More than a 75 month ago   Pain Frequency Intermittent      Objective:    POSTURE/OBSERVATION: Standing/sitting: increased thoracic kyphosis, small BOS with bilateral IR of hips, valgus left knee >right Left VMO/quadriceps with decreased muscle bulk as compared to right  PROM/AROM: Left knee full knee extension and flexion with mild discomfort end range flexion  STRENGTH:  Graded on a 0-5 scale Muscle Group Left 03/30/16 Right IE  Hip Flex 4/5 4/5  Hip Abd 4/5 4/5  Hip Add  4/5  Hip Ext    Hip IR/ER    Knee Flex 4/5 4/5  Knee Ext 4/5 4/5  Ankle DF 4/5 4/5  Ankle PF     SENSATION:    SPECIAL TESTS: LEFS   IE:36/80  03/30/16 23/80  FUNCTIONAL MOBILITY:  improved ability to rise  from sit and ambulate with decreased pain in knee, decreased walking distance due to pain in left knee  BALANCE: Fair standing balance significant improvement from initial evaluation per patient report  GAIT: Brace left knee; small BOS, decreased trunk rotation  OUTCOME MEASURES: TEST Outcome Interpretation  5 times sit<>stand 25 seconds >60 yo, >15 sec indicates increased risk for falls  10 meter walk test       0.83 m/s <1.0 m/s indicates increased risk for falls; limited community ambulator  Timed up and Go       not re assessed <14 sec indicates increased risk for falls      Treatment: Patella mobilization left LE with patella mobilizer x 3 reps for distraction and inferior/superior glides Modalities; Electrical stimulation: russian stim. 10/10 cycle  with patient performing SAQ /quad sets  x 3 reps each cycle; (2) electrodes applied to left VMO/quadriceps left LE, intensity to muscle contraction/tolerance, patientlong sitting on treatment table with pillow supporting LE x 69mn.  Therapeutic exercise: patient performed exercises with guidance, verbal and tactile cues and demonstration of PT:  Sitting: hip abduction with resistive band in sitting, green, x 20 reps Knee flexion with greenresistive band in sitting 1 x 15 reps Deferred knee extension due to patient reported feeling sore in knee today Standing hip abduction and extension with resistive band around thighs 3 sets x 3 reps each   NuStep x 152m. Level #3 (unbilled) at end of session  Patient response to treatment:Patient able to perform all exercises with good technique and verbalized good understanding of home exercises to prepare for TKA next week.,        PT Education - 03/30/16 0955    Education provided Yes   Education Details HEP: re assessed and use of heat discussed for pain control of knee prior to surgery   Person(s) Educated Patient   Methods Explanation;Demonstration;Verbal cues   Comprehension Verbalized understanding;Returned demonstration;Verbal cues required             PT Long Term Goals - 03/30/16 1000      PT LONG TERM GOAL #1   Title Pt will score <15 sec on 5x STS to reduce risk of falls withint 12 weeks.   Baseline IE: 27.o8 sec; 03/23/16 25 seconds; current 24 seconds   Status Partially Met     PT LONG TERM GOAL #2   Title Pt will score >1.0 m/s on 10 MW for increased functional mobility without AD within 12 weeks.   Baseline 10MW 12 seconds (.8374m partially met due to continued knee pain prior to surgery)   Status Partially Met     PT LONG TERM GOAL #3   Title Pt will score 10 sec or less on TUG to reduce risk of falls within 12 weeks.   Baseline 15.21 sec   Status Not Met     PT LONG TERM GOAL #4   Title Pt will improve  LEFS score by 9 points for increased function within 12 weeks.   Baseline IE 36/80; current 03/31/16 23/80 limited by pain in left knee with all weight bearing activities   Status Not Met               Plan - 03/30/16 0957    Clinical Impression Statement Patient is doing well in preparation for surgery next week. She is independent with home exercises and pain control and is ready for discharge to independent home program. She continues with pain in left knee with weight bearing  activities and has demonstrated good progress and significant improvement in strength since beginning physical therapy. She is ready to discharge to home program in preparation for TKA left knee.    Rehab Potential Fair   PT Frequency 2x / week   PT Duration 12 weeks   PT Treatment/Interventions Cryotherapy;Electrical Stimulation;Moist Heat;Ultrasound;Gait training;DME Lexicographer;Therapeutic activities;Therapeutic exercise;Balance training;Neuromuscular re-education;Patient/family education;Manual techniques   PT Next Visit Plan electrical stimulation for left quadriceps, strengthening exercises   PT Home Exercise Plan hip and knee exercises for ROM/strengthening   Consulted and Agree with Plan of Care Patient      Patient will benefit from skilled therapeutic intervention in order to improve the following deficits and impairments:  Abnormal gait, Decreased balance, Decreased endurance, Decreased strength, Difficulty walking, Impaired sensation, Pain  Visit Diagnosis: Muscle weakness (generalized)  Difficulty in walking, not elsewhere classified       G-Codes - 2016/04/03 1000    Functional Assessment Tool Used 5x STS, 10 MW,, TUG, LEFS, pain, strength deficits, clinical judgment   Functional Limitation Mobility: Walking and moving around   Mobility: Walking and Moving Around Goal Status [phone]) At least 1 percent but less than 20 percent impaired, limited or restricted   Mobility: Walking  and Moving Around Discharge Status [phone]) At least 20 percent but less than 40 percent impaired, limited or restricted      Problem List There are no active problems to display for this patient.   Jomarie Longs PT 03/31/2016, 3:18 PM  [place] [address], [place], [address] Phone: [phone]   Fax:  [phone]  Name: DALANIE KISNER MRN: [id number] Date of Birth: Dec 21, 1940

## 2016-04-04 ENCOUNTER — Encounter: Payer: Self-pay | Admitting: *Deleted

## 2016-04-04 ENCOUNTER — Inpatient Hospital Stay
Admission: RE | Admit: 2016-04-04 | Discharge: 2016-04-07 | DRG: 470 | Disposition: A | Discharge status: Home Health | Payer: Commercial Managed Care - HMO | Source: Ambulatory Visit | Attending: Orthopedic Surgery | Admitting: Orthopedic Surgery

## 2016-04-04 ENCOUNTER — Inpatient Hospital Stay: Payer: Commercial Managed Care - HMO | Admitting: Anesthesiology

## 2016-04-04 ENCOUNTER — Encounter
Admission: RE | Disposition: A | Discharge status: Home Health | Payer: Self-pay | Source: Ambulatory Visit | Attending: Orthopedic Surgery

## 2016-04-04 ENCOUNTER — Inpatient Hospital Stay: Payer: Commercial Managed Care - HMO

## 2016-04-04 DIAGNOSIS — D62 Acute posthemorrhagic anemia: Secondary | ICD-10-CM | POA: Diagnosis not present

## 2016-04-04 DIAGNOSIS — I7300 Raynaud's syndrome without gangrene: Secondary | ICD-10-CM | POA: Diagnosis present

## 2016-04-04 DIAGNOSIS — Z79899 Other long term (current) drug therapy: Secondary | ICD-10-CM

## 2016-04-04 DIAGNOSIS — K219 Gastro-esophageal reflux disease without esophagitis: Secondary | ICD-10-CM | POA: Diagnosis present

## 2016-04-04 DIAGNOSIS — M7122 Synovial cyst of popliteal space [Baker], left knee: Secondary | ICD-10-CM | POA: Diagnosis present

## 2016-04-04 DIAGNOSIS — M175 Other unilateral secondary osteoarthritis of knee: Secondary | ICD-10-CM | POA: Diagnosis not present

## 2016-04-04 DIAGNOSIS — J449 Chronic obstructive pulmonary disease, unspecified: Secondary | ICD-10-CM | POA: Diagnosis not present

## 2016-04-04 DIAGNOSIS — E785 Hyperlipidemia, unspecified: Secondary | ICD-10-CM | POA: Diagnosis present

## 2016-04-04 DIAGNOSIS — M069 Rheumatoid arthritis, unspecified: Secondary | ICD-10-CM | POA: Diagnosis not present

## 2016-04-04 DIAGNOSIS — Z23 Encounter for immunization: Secondary | ICD-10-CM | POA: Diagnosis not present

## 2016-04-04 DIAGNOSIS — I10 Essential (primary) hypertension: Secondary | ICD-10-CM | POA: Diagnosis not present

## 2016-04-04 DIAGNOSIS — Z7982 Long term (current) use of aspirin: Secondary | ICD-10-CM | POA: Diagnosis not present

## 2016-04-04 DIAGNOSIS — Z471 Aftercare following joint replacement surgery: Secondary | ICD-10-CM | POA: Diagnosis not present

## 2016-04-04 DIAGNOSIS — Z96652 Presence of left artificial knee joint: Secondary | ICD-10-CM | POA: Diagnosis not present

## 2016-04-04 DIAGNOSIS — G8918 Other acute postprocedural pain: Secondary | ICD-10-CM

## 2016-04-04 DIAGNOSIS — M1712 Unilateral primary osteoarthritis, left knee: Secondary | ICD-10-CM | POA: Diagnosis not present

## 2016-04-04 DIAGNOSIS — M6281 Muscle weakness (generalized): Secondary | ICD-10-CM

## 2016-04-04 DIAGNOSIS — Z8249 Family history of ischemic heart disease and other diseases of the circulatory system: Secondary | ICD-10-CM

## 2016-04-04 DIAGNOSIS — F418 Other specified anxiety disorders: Secondary | ICD-10-CM | POA: Diagnosis not present

## 2016-04-04 DIAGNOSIS — D649 Anemia, unspecified: Secondary | ICD-10-CM | POA: Diagnosis present

## 2016-04-04 HISTORY — PX: TOTAL KNEE ARTHROPLASTY: SHX125

## 2016-04-04 LAB — CBC
HEMATOCRIT: 31.7 % — AB (ref 35.0–47.0)
HEMOGLOBIN: 10.6 g/dL — AB (ref 12.0–16.0)
MCH: 31.8 pg (ref 26.0–34.0)
MCHC: 33.6 g/dL (ref 32.0–36.0)
MCV: 94.8 fL (ref 80.0–100.0)
Platelets: 234 10*3/uL (ref 150–440)
RBC: 3.34 MIL/uL — ABNORMAL LOW (ref 3.80–5.20)
RDW: 14.1 % (ref 11.5–14.5)
WBC: 11.2 10*3/uL — AB (ref 3.6–11.0)

## 2016-04-04 LAB — ABO/RH: ABO/RH(D): A POS

## 2016-04-04 LAB — CREATININE, SERUM: CREATININE: 0.76 mg/dL (ref 0.44–1.00)

## 2016-04-04 SURGERY — ARTHROPLASTY, KNEE, TOTAL
Anesthesia: Spinal | Site: Knee | Laterality: Left | Wound class: Clean

## 2016-04-04 MED ORDER — LORAZEPAM 1 MG PO TABS: 1.0000 mg | ORAL_TABLET | Freq: Four times a day (QID) | ORAL | Status: DC | PRN

## 2016-04-04 MED ORDER — OXYCODONE HCL 5 MG/5ML PO SOLN: 5.0000 mg | Freq: Once | ORAL | Status: DC | PRN

## 2016-04-04 MED ORDER — NEOMYCIN-POLYMYXIN B GU 40-200000 IR SOLN
Status: AC
  Filled 2016-04-04: qty 20

## 2016-04-04 MED ORDER — METHYLPREDNISOLONE SODIUM SUCC 125 MG IJ SOLR
INTRAMUSCULAR | Status: AC
  Administered 2016-04-04: 40 mg via INTRAVENOUS
  Filled 2016-04-04: qty 2

## 2016-04-04 MED ORDER — MORPHINE SULFATE (PF) 10 MG/ML IV SOLN
INTRAVENOUS | Status: AC
  Filled 2016-04-04: qty 1

## 2016-04-04 MED ORDER — METOCLOPRAMIDE HCL 5 MG/ML IJ SOLN: 5.0000 mg | Freq: Three times a day (TID) | INTRAMUSCULAR | Status: DC | PRN

## 2016-04-04 MED ORDER — BUPIVACAINE-EPINEPHRINE (PF) 0.25% -1:200000 IJ SOLN
INTRAMUSCULAR | Status: AC
  Filled 2016-04-04: qty 30

## 2016-04-04 MED ORDER — TRAMADOL HCL 50 MG PO TABS
100.0000 mg | ORAL_TABLET | Freq: Two times a day (BID) | ORAL | Status: DC
  Administered 2016-04-04 – 2016-04-07 (×7): 100 mg via ORAL
  Filled 2016-04-04 (×7): qty 2

## 2016-04-04 MED ORDER — PROMETHAZINE HCL 25 MG/ML IJ SOLN: 6.2500 mg | INTRAMUSCULAR | Status: DC | PRN

## 2016-04-04 MED ORDER — TRANEXAMIC ACID 1000 MG/10ML IV SOLN
1000.0000 mg | INTRAVENOUS | Status: AC
  Administered 2016-04-04: 1000 mg via INTRAVENOUS
  Filled 2016-04-04: qty 10

## 2016-04-04 MED ORDER — ACETAMINOPHEN 650 MG RE SUPP: 650.0000 mg | Freq: Four times a day (QID) | RECTAL | Status: DC | PRN

## 2016-04-04 MED ORDER — METHOCARBAMOL 500 MG PO TABS
500.0000 mg | ORAL_TABLET | Freq: Four times a day (QID) | ORAL | Status: DC | PRN
  Administered 2016-04-05: 500 mg via ORAL
  Filled 2016-04-04: qty 1

## 2016-04-04 MED ORDER — BUPIVACAINE-EPINEPHRINE (PF) 0.25% -1:200000 IJ SOLN
INTRAMUSCULAR | Status: DC | PRN
  Administered 2016-04-04: 30 mL

## 2016-04-04 MED ORDER — FERROUS SULFATE 325 (65 FE) MG PO TABS
325.0000 mg | ORAL_TABLET | Freq: Every day | ORAL | Status: DC
  Administered 2016-04-05 – 2016-04-07 (×3): 325 mg via ORAL
  Filled 2016-04-04 (×3): qty 1

## 2016-04-04 MED ORDER — OXYCODONE HCL 5 MG PO TABS: 5.0000 mg | ORAL_TABLET | Freq: Once | ORAL | Status: DC | PRN

## 2016-04-04 MED ORDER — PHENOL 1.4 % MT LIQD
1.0000 | OROMUCOSAL | Status: DC | PRN
  Filled 2016-04-04: qty 177

## 2016-04-04 MED ORDER — ENOXAPARIN SODIUM 30 MG/0.3ML ~~LOC~~ SOLN
30.0000 mg | Freq: Two times a day (BID) | SUBCUTANEOUS | Status: DC
  Administered 2016-04-05 – 2016-04-07 (×5): 30 mg via SUBCUTANEOUS
  Filled 2016-04-04 (×5): qty 0.3

## 2016-04-04 MED ORDER — DILTIAZEM HCL ER COATED BEADS 240 MG PO CP24
240.0000 mg | ORAL_CAPSULE | Freq: Every day | ORAL | Status: DC
  Administered 2016-04-05 – 2016-04-07 (×3): 240 mg via ORAL
  Filled 2016-04-04 (×3): qty 1

## 2016-04-04 MED ORDER — LEFLUNOMIDE 20 MG PO TABS: 20.0000 mg | ORAL_TABLET | Freq: Every day | ORAL | Status: DC

## 2016-04-04 MED ORDER — MORPHINE SULFATE 10 MG/ML IJ SOLN
INTRAMUSCULAR | Status: DC | PRN
  Administered 2016-04-04: 10 mg via SUBCUTANEOUS

## 2016-04-04 MED ORDER — CEFAZOLIN SODIUM-DEXTROSE 2-4 GM/100ML-% IV SOLN
INTRAVENOUS | Status: AC
  Filled 2016-04-04: qty 100

## 2016-04-04 MED ORDER — LACTATED RINGERS IV SOLN
INTRAVENOUS | Status: DC
  Administered 2016-04-04: 50 mL/h via INTRAVENOUS
  Administered 2016-04-04: 07:00:00 via INTRAVENOUS

## 2016-04-04 MED ORDER — DILTIAZEM HCL ER BEADS 240 MG PO CP24: 240.0000 mg | ORAL_CAPSULE | Freq: Every day | ORAL | Status: DC

## 2016-04-04 MED ORDER — MAGNESIUM CITRATE PO SOLN
1.0000 | Freq: Once | ORAL | Status: DC | PRN
  Filled 2016-04-04: qty 296

## 2016-04-04 MED ORDER — LOSARTAN POTASSIUM 50 MG PO TABS
100.0000 mg | ORAL_TABLET | Freq: Every day | ORAL | Status: DC
Start: 2016-04-05 — End: 2016-04-07
  Administered 2016-04-05 – 2016-04-07 (×3): 100 mg via ORAL
  Filled 2016-04-04 (×3): qty 2

## 2016-04-04 MED ORDER — MIDAZOLAM HCL 5 MG/5ML IJ SOLN
INTRAMUSCULAR | Status: DC | PRN
  Administered 2016-04-04: 1 mg via INTRAVENOUS

## 2016-04-04 MED ORDER — PROPOFOL 10 MG/ML IV BOLUS
INTRAVENOUS | Status: DC | PRN
  Administered 2016-04-04: 30 mg via INTRAVENOUS

## 2016-04-04 MED ORDER — ONDANSETRON HCL 4 MG/2ML IJ SOLN
INTRAMUSCULAR | Status: DC | PRN
  Administered 2016-04-04: 4 mg via INTRAVENOUS

## 2016-04-04 MED ORDER — DOCUSATE SODIUM 100 MG PO CAPS
100.0000 mg | ORAL_CAPSULE | Freq: Two times a day (BID) | ORAL | Status: DC
  Administered 2016-04-04 – 2016-04-07 (×6): 100 mg via ORAL
  Filled 2016-04-04 (×6): qty 1

## 2016-04-04 MED ORDER — MENTHOL 3 MG MT LOZG
1.0000 | LOZENGE | OROMUCOSAL | Status: DC | PRN
Start: 2016-04-04 — End: 2016-04-07
  Filled 2016-04-04: qty 9

## 2016-04-04 MED ORDER — FENTANYL CITRATE (PF) 100 MCG/2ML IJ SOLN
25.0000 ug | INTRAMUSCULAR | Status: DC | PRN
Start: 2016-04-04 — End: 2016-04-04

## 2016-04-04 MED ORDER — ONDANSETRON HCL 4 MG/2ML IJ SOLN
4.0000 mg | Freq: Four times a day (QID) | INTRAMUSCULAR | Status: DC | PRN
  Administered 2016-04-04: 4 mg via INTRAVENOUS
  Filled 2016-04-04: qty 2

## 2016-04-04 MED ORDER — KETAMINE HCL 50 MG/ML IJ SOLN
INTRAMUSCULAR | Status: DC | PRN
  Administered 2016-04-04 (×2): 25 mg via INTRAMUSCULAR

## 2016-04-04 MED ORDER — BUPIVACAINE LIPOSOME 1.3 % IJ SUSP
INTRAMUSCULAR | Status: AC
  Filled 2016-04-04: qty 20

## 2016-04-04 MED ORDER — DIPHENHYDRAMINE HCL 12.5 MG/5ML PO ELIX
12.5000 mg | ORAL_SOLUTION | ORAL | Status: DC | PRN
  Administered 2016-04-05 – 2016-04-06 (×2): 25 mg via ORAL
  Filled 2016-04-04 (×2): qty 10

## 2016-04-04 MED ORDER — CEFAZOLIN IN D5W 1 GM/50ML IV SOLN
1.0000 g | Freq: Four times a day (QID) | INTRAVENOUS | Status: AC
  Administered 2016-04-04 (×2): 1 g via INTRAVENOUS
  Filled 2016-04-04 (×2): qty 50

## 2016-04-04 MED ORDER — IRBESARTAN 75 MG PO TABS: 75.0000 mg | ORAL_TABLET | Freq: Every day | ORAL | Status: DC

## 2016-04-04 MED ORDER — BISACODYL 10 MG RE SUPP: 10.0000 mg | Freq: Every day | RECTAL | Status: DC | PRN

## 2016-04-04 MED ORDER — PROPOFOL 500 MG/50ML IV EMUL
INTRAVENOUS | Status: DC | PRN
  Administered 2016-04-04: 35 ug/kg/min via INTRAVENOUS

## 2016-04-04 MED ORDER — MORPHINE SULFATE (PF) 2 MG/ML IV SOLN
2.0000 mg | INTRAVENOUS | Status: DC | PRN
  Administered 2016-04-04 (×2): 2 mg via INTRAVENOUS
  Filled 2016-04-04 (×2): qty 1

## 2016-04-04 MED ORDER — METOCLOPRAMIDE HCL 10 MG PO TABS
5.0000 mg | ORAL_TABLET | Freq: Three times a day (TID) | ORAL | Status: DC | PRN
  Administered 2016-04-04: 10 mg via ORAL
  Filled 2016-04-04: qty 2

## 2016-04-04 MED ORDER — SODIUM CHLORIDE 0.9 % IJ SOLN
INTRAMUSCULAR | Status: AC
  Filled 2016-04-04: qty 100

## 2016-04-04 MED ORDER — MAGNESIUM HYDROXIDE 400 MG/5ML PO SUSP
30.0000 mL | Freq: Every day | ORAL | Status: DC | PRN
  Administered 2016-04-05: 30 mL via ORAL
  Filled 2016-04-04: qty 30

## 2016-04-04 MED ORDER — ADULT MULTIVITAMIN W/MINERALS CH
1.0000 | ORAL_TABLET | Freq: Every day | ORAL | Status: DC
  Administered 2016-04-05 – 2016-04-07 (×2): 1 via ORAL
  Filled 2016-04-04 (×3): qty 1

## 2016-04-04 MED ORDER — PRAVASTATIN SODIUM 40 MG PO TABS
80.0000 mg | ORAL_TABLET | Freq: Every day | ORAL | Status: DC
  Administered 2016-04-04 – 2016-04-06 (×3): 80 mg via ORAL
  Filled 2016-04-04 (×3): qty 2

## 2016-04-04 MED ORDER — NEOMYCIN-POLYMYXIN B GU 40-200000 IR SOLN
Status: DC | PRN
  Administered 2016-04-04: 16 mL

## 2016-04-04 MED ORDER — SERTRALINE HCL 50 MG PO TABS
50.0000 mg | ORAL_TABLET | Freq: Every day | ORAL | Status: DC
  Administered 2016-04-04 – 2016-04-06 (×3): 50 mg via ORAL
  Filled 2016-04-04 (×3): qty 1

## 2016-04-04 MED ORDER — BUPIVACAINE HCL (PF) 0.5 % IJ SOLN
INTRAMUSCULAR | Status: DC | PRN
  Administered 2016-04-04: 3 mL

## 2016-04-04 MED ORDER — ACETAMINOPHEN 325 MG PO TABS
650.0000 mg | ORAL_TABLET | Freq: Four times a day (QID) | ORAL | Status: DC | PRN
  Administered 2016-04-05 – 2016-04-06 (×2): 650 mg via ORAL
  Filled 2016-04-04 (×2): qty 2

## 2016-04-04 MED ORDER — CEFAZOLIN SODIUM-DEXTROSE 2-4 GM/100ML-% IV SOLN
2.0000 g | Freq: Once | INTRAVENOUS | Status: AC
  Administered 2016-04-04: 2 g via INTRAVENOUS

## 2016-04-04 MED ORDER — METHYLPREDNISOLONE SODIUM SUCC 125 MG IJ SOLR
40.0000 mg | Freq: Three times a day (TID) | INTRAMUSCULAR | Status: AC
Start: 2016-04-04 — End: 2016-04-05
  Administered 2016-04-04 – 2016-04-05 (×3): 40 mg via INTRAVENOUS
  Filled 2016-04-04 (×3): qty 2

## 2016-04-04 MED ORDER — SODIUM CHLORIDE 0.9 % IV SOLN
INTRAVENOUS | Status: DC
  Administered 2016-04-04 (×2): via INTRAVENOUS

## 2016-04-04 MED ORDER — GEMFIBROZIL 600 MG PO TABS
600.0000 mg | ORAL_TABLET | Freq: Two times a day (BID) | ORAL | Status: DC
  Administered 2016-04-04 – 2016-04-07 (×6): 600 mg via ORAL
  Filled 2016-04-04 (×6): qty 1

## 2016-04-04 MED ORDER — METHOCARBAMOL 1000 MG/10ML IJ SOLN
500.0000 mg | Freq: Four times a day (QID) | INTRAVENOUS | Status: DC | PRN
  Filled 2016-04-04: qty 5

## 2016-04-04 MED ORDER — HYDROXYCHLOROQUINE SULFATE 200 MG PO TABS
200.0000 mg | ORAL_TABLET | Freq: Two times a day (BID) | ORAL | Status: DC
Start: 2016-04-04 — End: 2016-04-07
  Administered 2016-04-04 – 2016-04-07 (×7): 200 mg via ORAL
  Filled 2016-04-04 (×7): qty 1

## 2016-04-04 MED ORDER — CALCIUM CARBONATE-VITAMIN D 500-200 MG-UNIT PO TABS
1.0000 | ORAL_TABLET | Freq: Two times a day (BID) | ORAL | Status: DC
  Administered 2016-04-04 – 2016-04-07 (×6): 1 via ORAL
  Filled 2016-04-04 (×6): qty 1

## 2016-04-04 MED ORDER — MEPERIDINE HCL 25 MG/ML IJ SOLN: 6.2500 mg | INTRAMUSCULAR | Status: DC | PRN

## 2016-04-04 MED ORDER — EPHEDRINE SULFATE 50 MG/ML IJ SOLN
INTRAMUSCULAR | Status: DC | PRN
  Administered 2016-04-04 (×4): 5 mg via INTRAVENOUS
  Administered 2016-04-04: 10 mg via INTRAVENOUS

## 2016-04-04 MED ORDER — SODIUM CHLORIDE 0.9 % IV SOLN
INTRAVENOUS | Status: DC | PRN
  Administered 2016-04-04: 60 mL

## 2016-04-04 MED ORDER — PANTOPRAZOLE SODIUM 40 MG PO TBEC
40.0000 mg | DELAYED_RELEASE_TABLET | Freq: Every day | ORAL | Status: DC
  Administered 2016-04-05 – 2016-04-07 (×3): 40 mg via ORAL
  Filled 2016-04-04 (×4): qty 1

## 2016-04-04 MED ORDER — PREDNISONE 10 MG PO TABS
5.0000 mg | ORAL_TABLET | Freq: Every day | ORAL | Status: DC
  Administered 2016-04-06 – 2016-04-07 (×2): 5 mg via ORAL
  Filled 2016-04-04 (×2): qty 1

## 2016-04-04 MED ORDER — ZOLPIDEM TARTRATE 5 MG PO TABS: 5.0000 mg | ORAL_TABLET | Freq: Every evening | ORAL | Status: DC | PRN

## 2016-04-04 MED ORDER — OXYCODONE HCL 5 MG PO TABS
5.0000 mg | ORAL_TABLET | ORAL | Status: DC | PRN
  Administered 2016-04-04: 5 mg via ORAL
  Administered 2016-04-04: 10 mg via ORAL
  Administered 2016-04-04 – 2016-04-06 (×6): 5 mg via ORAL
  Administered 2016-04-07: 10 mg via ORAL
  Filled 2016-04-04: qty 2
  Filled 2016-04-04 (×4): qty 1
  Filled 2016-04-04 (×2): qty 2
  Filled 2016-04-04 (×2): qty 1

## 2016-04-04 MED ORDER — PRESERVISION AREDS PO CAPS: 1.0000 | ORAL_CAPSULE | Freq: Two times a day (BID) | ORAL | Status: DC

## 2016-04-04 MED ORDER — ASPIRIN 325 MG PO TABS
325.0000 mg | ORAL_TABLET | Freq: Every day | ORAL | Status: DC
  Administered 2016-04-05 – 2016-04-07 (×3): 325 mg via ORAL
  Filled 2016-04-04 (×4): qty 1

## 2016-04-04 MED ORDER — ONDANSETRON HCL 4 MG PO TABS
4.0000 mg | ORAL_TABLET | Freq: Four times a day (QID) | ORAL | Status: DC | PRN
  Filled 2016-04-04: qty 1

## 2016-04-04 SURGICAL SUPPLY — 62 items
BANDAGE ACE 6X5 VEL STRL LF (GAUZE/BANDAGES/DRESSINGS) ×3 IMPLANT
BLADE SAW 1 (BLADE) ×3 IMPLANT
BLOCK CUTTING FEMUR 3+ LT MED (MISCELLANEOUS) IMPLANT
BLOCK CUTTING TIBIAL 3 LT (MISCELLANEOUS) IMPLANT
CANISTER SUCT 1200ML W/VALVE (MISCELLANEOUS) ×3 IMPLANT
CANISTER SUCT 3000ML (MISCELLANEOUS) ×6 IMPLANT
CAPT KNEE TOTAL 3 ×3 IMPLANT
CATH FOL LEG HOLDER (MISCELLANEOUS) ×3 IMPLANT
CATH TRAY METER 16FR LF (MISCELLANEOUS) ×3 IMPLANT
CEMENT HV SMART SET (Cement) ×6 IMPLANT
CHLORAPREP W/TINT 26ML (MISCELLANEOUS) ×6 IMPLANT
COOLER POLAR GLACIER W/PUMP (MISCELLANEOUS) ×3 IMPLANT
CUFF TOURN 18 STER (MISCELLANEOUS) ×3 IMPLANT
CUFF TOURN 24 STER (MISCELLANEOUS) IMPLANT
CUFF TOURN 30 STER DUAL PORT (MISCELLANEOUS) IMPLANT
DRAPE INCISE IOBAN 66X45 STRL (DRAPES) ×6 IMPLANT
DRAPE SHEET LG 3/4 BI-LAMINATE (DRAPES) ×6 IMPLANT
ELECT CAUTERY BLADE 6.4 (BLADE) ×3 IMPLANT
ELECT REM PT RETURN 9FT ADLT (ELECTROSURGICAL) ×3
ELECTRODE REM PT RTRN 9FT ADLT (ELECTROSURGICAL) ×1 IMPLANT
FEMUR BONE MODEL-49010 IMPLANT
GAUZE PETRO XEROFOAM 1X8 (MISCELLANEOUS) ×3 IMPLANT
GAUZE SPONGE 4X4 12PLY STRL (GAUZE/BANDAGES/DRESSINGS) ×3 IMPLANT
GLOVE BIOGEL PI IND STRL 9 (GLOVE) ×1 IMPLANT
GLOVE BIOGEL PI INDICATOR 9 (GLOVE) ×2
GLOVE INDICATOR 8.0 STRL GRN (GLOVE) ×6 IMPLANT
GLOVE SURG ORTHO 8.0 STRL STRW (GLOVE) ×6 IMPLANT
GLOVE SURG SYN 9.0  PF PI (GLOVE) ×6
GLOVE SURG SYN 9.0 PF PI (GLOVE) ×3 IMPLANT
GOWN SRG 2XL LVL 4 RGLN SLV (GOWNS) ×1 IMPLANT
GOWN STRL NON-REIN 2XL LVL4 (GOWNS) ×2
GOWN STRL REUS W/ TWL LRG LVL3 (GOWN DISPOSABLE) ×3 IMPLANT
GOWN STRL REUS W/ TWL XL LVL3 (GOWN DISPOSABLE) ×1 IMPLANT
GOWN STRL REUS W/TWL LRG LVL3 (GOWN DISPOSABLE) ×6
GOWN STRL REUS W/TWL XL LVL3 (GOWN DISPOSABLE) ×2
HANDPIECE INTERPULSE COAX TIP (DISPOSABLE) ×2
HOOD PEEL AWAY FLYTE STAYCOOL (MISCELLANEOUS) ×6 IMPLANT
IMMBOLIZER KNEE 19 BLUE UNIV (SOFTGOODS) ×3 IMPLANT
KIT RM TURNOVER STRD PROC AR (KITS) ×3 IMPLANT
KNEE MEDACTA TIBIAL/FEMORAL BL (Knees) ×3 IMPLANT
KNIFE SCULPS 14X20 (INSTRUMENTS) ×3 IMPLANT
NDL SAFETY 18GX1.5 (NEEDLE) ×3 IMPLANT
NEEDLE SPNL 18GX3.5 QUINCKE PK (NEEDLE) ×3 IMPLANT
NEEDLE SPNL 20GX3.5 QUINCKE YW (NEEDLE) ×3 IMPLANT
NS IRRIG 1000ML POUR BTL (IV SOLUTION) ×3 IMPLANT
PACK TOTAL KNEE (MISCELLANEOUS) ×3 IMPLANT
PAD WRAPON POLAR KNEE (MISCELLANEOUS) ×1 IMPLANT
SET HNDPC FAN SPRY TIP SCT (DISPOSABLE) ×1 IMPLANT
SOL .9 NS 3000ML IRR  AL (IV SOLUTION) ×2
SOL .9 NS 3000ML IRR UROMATIC (IV SOLUTION) ×1 IMPLANT
STAPLER SKIN PROX 35W (STAPLE) ×3 IMPLANT
SUCTION FRAZIER HANDLE 10FR (MISCELLANEOUS) ×2
SUCTION TUBE FRAZIER 10FR DISP (MISCELLANEOUS) ×1 IMPLANT
SUT DVC 2 QUILL PDO  T11 36X36 (SUTURE) ×2
SUT DVC 2 QUILL PDO T11 36X36 (SUTURE) ×1 IMPLANT
SUT DVC QUILL MONODERM 30X30 (SUTURE) ×3 IMPLANT
SYR 20CC LL (SYRINGE) ×3 IMPLANT
SYR 50ML LL SCALE MARK (SYRINGE) ×6 IMPLANT
TIBIAL BONE MODEL LEFT (MISCELLANEOUS) ×3 IMPLANT
TOWEL OR 17X26 4PK STRL BLUE (TOWEL DISPOSABLE) ×3 IMPLANT
TOWER CARTRIDGE SMART MIX (DISPOSABLE) ×3 IMPLANT
WRAPON POLAR PAD KNEE (MISCELLANEOUS) ×3

## 2016-04-04 NOTE — Anesthesia Procedure Notes (Signed)
Spinal  Patient location during procedure: OR Start time: 04/04/2016 7:15 AM End time: 04/04/2016 7:28 AM Staffing Anesthesiologist: Emmie Niemann Performed: anesthesiologist  Preanesthetic Checklist Completed: patient identified, site marked, surgical consent, pre-op evaluation, timeout performed, IV checked, risks and benefits discussed and monitors and equipment checked Spinal Block Patient position: sitting Prep: ChloraPrep Patient monitoring: heart rate, continuous pulse ox, blood pressure and cardiac monitor Approach: midline Location: L4-5 Injection technique: single-shot Needle Needle type: Whitacre and Introducer  Needle gauge: 24 G Needle length: 9 cm Additional Notes Negative paresthesia. Negative blood return. Positive free-flowing CSF. Expiration date of kit checked and confirmed. Patient tolerated procedure well, without complications.

## 2016-04-04 NOTE — Addendum Note (Signed)
Addendum  created 04/04/16 1126 by Nelda Marseille, CRNA   Anesthesia Intra Meds edited

## 2016-04-04 NOTE — Anesthesia Procedure Notes (Signed)
Date/Time: 04/04/2016 7:47 AM Performed by: Nelda Marseille Pre-anesthesia Checklist: Patient identified, Emergency Drugs available, Suction available, Patient being monitored and Timeout performed Oxygen Delivery Method: Simple face mask

## 2016-04-04 NOTE — Op Note (Signed)
04/04/2016  9:19 AM  PATIENT:  Melissa [name]  75 y.o. female  PRE-OPERATIVE DIAGNOSIS:  other secondary osteoarthritis  POST-OPERATIVE DIAGNOSIS:  other secondary osteoarthritis  PROCEDURE:  Procedure(s): TOTAL KNEE ARTHROPLASTY (Left)  SURGEON: Laurene Footman, MD  ASSISTANTS: Rachelle Hora [facility]  ANESTHESIA:   spinal  EBL:  Total I/O In: 800 [I.V.:800] Out: 160 [Urine:150; Blood:10]  BLOOD ADMINISTERED:none  DRAINS: none   LOCAL MEDICATIONS USED:  MARCAINE    and OTHER morphine and exparel  SPECIMEN:  No Specimen  DISPOSITION OF SPECIMEN:  N/A  COUNTS:  YES  TOURNIQUET:   56 minutes at 275 mm Hg   IMPLANTS: Medacta GMK sphere left 3+ femur, 3 tibia with stem and 11 mm insert, to patella all components cemented  DICTATION: .Dragon Dictation patient brought the operating room and after adequate anesthesia was obtained left leg was prepped and draped in sterile fashion was turned by the upper thigh. After prepping draping the sterile fashion appropriate patient identification and timeout procedures were completed. A midline skin incision was made after inflating the tourniquet followed by medial parapatellar arthrotomy showing moderate synovitis as well as complete wear of the lateral femoral condyle no articular cartilage remaining. Moderate degenerative change to the rest of the knee. Anterior cruciate ligament and PCL were excised along the fat pad and the proximal tibia was exposed for application of the my knee cutting guide. Proximal tibia cut carried out with excision of the menisci at this time. Distal femoral cutting block was applied after removing articular cartilage and the distal femoral cut carried out. The 4-in-1 cutting block applied to the distal femur anterior posterior chamfer cuts made without notching. The trochlear groove cut was carried out and with trial 3 tibia in place and the trial femur 11 mm insert gave excellent stability and extension and mid flexion.  Patella tracked well. The patella was then cut using the patellar cutting guide and measured a size 2 with 3 drill holes made. At this point the bony surfaces were thoroughly irrigated and dried after first letting down the tourniquet and make sure there is no significant bleeding and there was none. The bony surfaces were thoroughly dried and the components were cemented in place with the tibial components cemented with excess cement removed followed by the the polyethylene insert with set screw with torque wrench and femoral femur with the knee in extension. The patella was clamped in place and the knee held in extension as the cement set. Excess cement was removed and the knee thoroughly irrigated tourniquet is let down and the arthrotomy repaired using a heavy Quill. 2-0 Brion Aliment was used subcutaneously followed by skin staples. Xeroform 4 x 4's ABDs web roll Polar Care Ace wrap applied. Additionally there was a large Baker's cyst from the posterior medial aspect of the knee this was opened and debrided during the procedure  PLAN OF CARE: Admit to inpatient   PATIENT DISPOSITION:  PACU - hemodynamically stable.

## 2016-04-04 NOTE — Transfer of Care (Signed)
Immediate Anesthesia Transfer of Care Note  Patient: Melissa [name]  Procedure(s) Performed: Procedure(s): TOTAL KNEE ARTHROPLASTY (Left)  Patient Location: PACU  Anesthesia Type:Spinal  Level of Consciousness: awake, alert  and oriented  Airway & Oxygen Therapy: Patient Spontanous Breathing and Patient connected to face mask oxygen  Post-op Assessment: Report given to RN and Post -op Vital signs reviewed and stable  Post vital signs: Reviewed and stable  Last Vitals:  Vitals:   04/04/16 0623  BP: (!) 132/59  Pulse: 66  Resp: 18  Temp: (!) 36.1 C    Last Pain:  Vitals:   04/04/16 0623  TempSrc: Tympanic  PainSc: 4       Patients Stated Pain Goal: 0 (67/61/95 0932)  Complications: No apparent anesthesia complications

## 2016-04-04 NOTE — Anesthesia Postprocedure Evaluation (Signed)
Anesthesia Post Note  Patient: Melissa [name]  Procedure(s) Performed: Procedure(s) (LRB): TOTAL KNEE ARTHROPLASTY (Left)  Patient location during evaluation: PACU Anesthesia Type: Spinal Level of consciousness: oriented and awake and alert Pain management: pain level controlled Vital Signs Assessment: post-procedure vital signs reviewed and stable Respiratory status: spontaneous breathing, respiratory function stable and nonlabored ventilation Cardiovascular status: blood pressure returned to baseline and stable Postop Assessment: no headache and no backache Anesthetic complications: no    Last Vitals:  Vitals:   04/04/16 0951 04/04/16 1006  BP: (!) 107/47 (!) 111/46  Pulse: (!) 59 61  Resp: 16 15  Temp:  36.6 C    Last Pain:  Vitals:   04/04/16 1006  TempSrc:   PainSc: 0-No pain                 Allanna Bresee

## 2016-04-04 NOTE — NC FL2 (Signed)
[place] LEVEL OF CARE SCREENING TOOL     IDENTIFICATION  Patient Name: Melissa [name] Birthdate: 02-Aug-1940 Sex: female Admission Date (Current Location): 04/04/2016  [place] and [place] Number:  Engineering geologist and Address:  [facility], [address] [place], [place] [address]      Provider Number: [id number]  Attending Physician Name and Address:  Hessie Knows, MD  Relative Name and Phone Number:       Current Level of Care: Hospital Recommended Level of Care: [place] Prior Approval Number:    Date Approved/Denied:   PASRR Number:  [id number])  Discharge Plan: SNF    Current Diagnoses: Patient Active Problem List   Diagnosis Date Noted  . Other secondary osteoarthritis of one knee 04/04/2016   Hypertension  ([place]) a. Diastolic dysfunction  Hyperlipidemia, unspecified    Pulmonary fibrosis (CMS-HCC)  on CT scan  COPD (chronic obstructive pulmonary disease) , unspecified (CMS-HCC)    Acid reflux disease    Anxiety disorder, unspecified    Palpitations    IBS (irritable bowel syndrome)    Osteoarthritis  ([place]) a. Hands b. Lumbar spine c. Carpal tunnel  Herpes    Diverticulosis    Anemia, unspecified  Chronic anemia/stomach telangiectasias  Depression, unspecified    Sinusitis, unspecified    Psoriasis, unspecified  ([place]) a. Question psoriatic arthritis  Connective tissue disease (CMS-HCC)  question scleroderma ([place]) a. s/p Methotrexate, intolerant b. Low dose Prednisone c. Leflunomide e. s/p Plaquenil f. Rheumatoid factor negative, FANA 1:160. Negative SCL-70 antibody. g. Raynaud's h. Sclerodactyly? i. Mild pulmonary fibrosis j. Stomach telangiectasias. No significant hand telangiectasias.   Chickenpox    Hemorrhoids    Cataract cortical, senile       Orientation RESPIRATION BLADDER Height & Weight     Self, Time, Situation, Place  O2 (Nasal Cannula 2 L/min)  Continent Weight: 133 lb (60.3 kg) Height:  5' 4"  (162.6 cm)  BEHAVIORAL SYMPTOMS/MOOD NEUROLOGICAL BOWEL NUTRITION STATUS   (None. )  (None. ) Continent Diet (Clear Liquid Diet )  AMBULATORY STATUS COMMUNICATION OF NEEDS Skin   Extensive Assist Verbally Surgical wounds (Left Knee Incision )                       Personal Care Assistance Level of Assistance  Bathing, Feeding, Dressing Bathing Assistance: Limited assistance Feeding assistance: Independent Dressing Assistance: Limited assistance     Functional Limitations Info  Sight, Hearing, Speech Sight Info: Adequate Hearing Info: Adequate Speech Info: Adequate    SPECIAL CARE FACTORS FREQUENCY  PT (By licensed PT), OT (By licensed OT)     PT Frequency:  (5) OT Frequency:  (5)            Contractures      Additional Factors Info  Code Status, Allergies, Psychotropic Code Status Info:  (Full Code ) Allergies Info:  (Lodine Etodolac, Methotrexate Derivatives, Levaquin  Levofloxacin, Sulfa Antibiotics) Psychotropic Info:  (Zoloft)         Current Medications (04/04/2016):  This is the current hospital active medication list Current Facility-Administered Medications  Medication Dose Route Frequency Provider Last Rate Last Dose  . 0.9 %  sodium chloride infusion   Intravenous Continuous Hessie Knows, MD 75 mL/hr at 04/04/16 1122    . acetaminophen (TYLENOL) tablet 650 mg  650 mg Oral Q6H PRN Hessie Knows, MD       Or  . acetaminophen (TYLENOL) suppository 650 mg  650 mg  Rectal Q6H PRN Hessie Knows, MD      . aspirin tablet 325 mg  325 mg Oral Daily Hessie Knows, MD      . bisacodyl (DULCOLAX) suppository 10 mg  10 mg Rectal Daily PRN Hessie Knows, MD      . calcium-vitamin D (OSCAL WITH D) 500-200 MG-UNIT per tablet 1 tablet  1 tablet Oral BID Hessie Knows, MD      . ceFAZolin (ANCEF) IVPB 1 g/50 mL premix  1 g Intravenous Q6H Hessie Knows, MD      . Derrill Memo ON 04/05/2016] diltiazem (CARDIZEM CD) 24 hr capsule 240  mg  240 mg Oral Daily Hessie Knows, MD      . diphenhydrAMINE (BENADRYL) 12.5 MG/5ML elixir 12.5-25 mg  12.5-25 mg Oral Q4H PRN Hessie Knows, MD      . docusate sodium (COLACE) capsule 100 mg  100 mg Oral BID Hessie Knows, MD      . Derrill Memo ON 04/05/2016] enoxaparin (LOVENOX) injection 30 mg  30 mg Subcutaneous Q12H Hessie Knows, MD      . Derrill Memo ON 04/05/2016] ferrous sulfate tablet 325 mg  325 mg Oral Q breakfast Hessie Knows, MD      . gemfibrozil (LOPID) tablet 600 mg  600 mg Oral BID AC Hessie Knows, MD      . hydroxychloroquine (PLAQUENIL) tablet 200 mg  200 mg Oral BID Hessie Knows, MD      . leflunomide (ARAVA) tablet 20 mg  20 mg Oral Daily Hessie Knows, MD      . LORazepam (ATIVAN) tablet 1 mg  1 mg Oral Q6H PRN Hessie Knows, MD      . Derrill Memo ON 04/05/2016] losartan (COZAAR) tablet 100 mg  100 mg Oral Daily Hessie Knows, MD      . magnesium citrate solution 1 Bottle  1 Bottle Oral Once PRN Hessie Knows, MD      . magnesium hydroxide (MILK OF MAGNESIA) suspension 30 mL  30 mL Oral Daily PRN Hessie Knows, MD      . menthol-cetylpyridinium (CEPACOL) lozenge 3 mg  1 lozenge Oral PRN Hessie Knows, MD       Or  . phenol (CHLORASEPTIC) mouth spray 1 spray  1 spray Mouth/Throat PRN Hessie Knows, MD      . methocarbamol (ROBAXIN) tablet 500 mg  500 mg Oral Q6H PRN Hessie Knows, MD       Or  . methocarbamol (ROBAXIN) 500 mg in dextrose 5 % 50 mL IVPB  500 mg Intravenous Q6H PRN Hessie Knows, MD      . methylPREDNISolone sodium succinate (SOLU-MEDROL) 125 mg/2 mL injection 40 mg  40 mg Intravenous Q8H Hessie Knows, MD   40 mg at 04/04/16 1000  . metoCLOPramide (REGLAN) tablet 5-10 mg  5-10 mg Oral Q8H PRN Hessie Knows, MD       Or  . metoCLOPramide [facility]) injection 5-10 mg  5-10 mg Intravenous Q8H PRN Hessie Knows, MD      . morphine 2 MG/ML injection 2 mg  2 mg Intravenous Q2H PRN Hessie Knows, MD   2 mg at 04/04/16 1059  . multivitamin with minerals tablet 1 tablet  1 tablet Oral Daily Hessie Knows, MD      . ondansetron [facility]) tablet 4 mg  4 mg Oral Q6H PRN Hessie Knows, MD       Or  . ondansetron [facility]) injection 4 mg  4 mg Intravenous Q6H PRN Hessie Knows, MD      .  oxyCODONE (Oxy IR/ROXICODONE) immediate release tablet 5-10 mg  5-10 mg Oral Q3H PRN Hessie Knows, MD   5 mg at 04/04/16 1131  . [START ON 04/05/2016] pantoprazole (PROTONIX) EC tablet 40 mg  40 mg Oral Daily Hessie Knows, MD      . pravastatin (PRAVACHOL) tablet 80 mg  80 mg Oral QHS Hessie Knows, MD      . Derrill Memo ON 04/06/2016] predniSONE (DELTASONE) tablet 5 mg  5 mg Oral Daily Hessie Knows, MD      . sertraline (ZOLOFT) tablet 50 mg  50 mg Oral QHS Hessie Knows, MD      . traMADol [facility]) tablet 100 mg  100 mg Oral BID Hessie Knows, MD      . zolpidem [facility]) tablet 5 mg  5 mg Oral QHS PRN Hessie Knows, MD         Discharge Medications: Please see discharge summary for a list of discharge medications.  Relevant Imaging Results:  Relevant Lab Results:   Additional Information  (SSN: [id number])  Danie Chandler, [place] Work [phone]

## 2016-04-04 NOTE — Evaluation (Signed)
Physical Therapy Evaluation Patient Details Name: Melissa [name] MRN: [id number] DOB: 1941/01/25 Today's Date: 04/04/2016   History of Present Illness  Pt is s/p L TKA secondary to OA 04/04/16.  PMH includes anxiety, RA, ataxia, COPD, excessive falling, htn, psoriasis, and Raynaud's disease.  Clinical Impression  Prior to admission, pt was independent ambulating without AD.  Pt lives with her husband in 1 level home (level entry) with 2 small steps to enter kitchen and 1 small step to get to bathroom.  Currently pt is SBA supine to sit and CGA with transfers and taking a few steps bed to recliner with RW.  Pt able to perform L LE SLR x10 reps independently so no KI utilized.  Pt would benefit from skilled PT to address noted impairments and functional limitations.  Recommend pt discharge to home with HHPT and support of family when medically appropriate.    Follow Up Recommendations Home health PT    Equipment Recommendations  Rolling walker with 5" wheels (pt already owns RW (pt's daughter to bring in to assess fit/safety))    Recommendations for Other Services       Precautions / Restrictions Precautions Precautions: Knee;Fall Precaution Comments: KI if unable to perform SLR x10 reps Restrictions Weight Bearing Restrictions: Yes LLE Weight Bearing: Weight bearing as tolerated      Mobility  Bed Mobility Overal bed mobility: Needs Assistance Bed Mobility: Supine to Sit     Supine to sit: Supervision;HOB elevated     General bed mobility comments: SBA for lines; mild increased time to perform  Transfers Overall transfer level: Needs assistance Equipment used: Rolling walker (2 wheeled) Transfers: Sit to/from Stand Sit to Stand: Min guard         General transfer comment: vc's required for hand and feet positioning; steady without loss of balance  Ambulation/Gait Ambulation/Gait assistance: Min guard Ambulation Distance (Feet): 3 Feet (bed to chair) Assistive device:  Rolling walker (2 wheeled) Gait Pattern/deviations: Step-to pattern Gait velocity: mildly decreased   General Gait Details: mild decreased stance time L LE; vc's to increase UE support on RW d/t L knee pain  Stairs            Wheelchair Mobility    Modified Rankin (Stroke Patients Only)       Balance Overall balance assessment: Needs assistance Sitting-balance support: No upper extremity supported;Feet supported Sitting balance-Leahy Scale: Good     Standing balance support: Bilateral upper extremity supported;During functional activity (RW) Standing balance-Leahy Scale: Good                               Pertinent Vitals/Pain Pain Assessment: 0-10 Pain Score: 6  (6/10 beginning of session; 5/10 end of session) Pain Location: L knee Pain Descriptors / Indicators: Sore;Tender Pain Intervention(s): Limited activity within patient's tolerance;Monitored during session;Repositioned;RN gave pain meds during session;Ice applied  Vitals stable and WFL throughout treatment session (HR and O2).    Home Living Family/patient expects to be discharged to:: Private residence Living Arrangements: Spouse/significant other Available Help at Discharge: Family Type of Home: House Home Access: Level entry;Other (comment) (2 small steps into kitchen and 1 small step to bathroom (have ramps to put in if needed but pt would prefer not to); able to hold onto door frame)     Home Layout: One level Home Equipment: Walker - 2 wheels      Prior Function Level of Independence: Independent  Comments: Pt denies any falls in past 6 months.     Hand Dominance        Extremity/Trunk Assessment   Upper Extremity Assessment: Overall WFL for tasks assessed           Lower Extremity Assessment: RLE deficits/detail;LLE deficits/detail RLE Deficits / Details: R LE strength and ROM WFL LLE Deficits / Details: able to perform x10 L LE SLR independently; hip flexion  at least 3/5; knee flexion/extension at least 2+/5; DF at least 3+/5  Cervical / Trunk Assessment: Normal  Communication   Communication: No difficulties  Cognition Arousal/Alertness: Awake/alert Behavior During Therapy: WFL for tasks assessed/performed Overall Cognitive Status: Within Functional Limits for tasks assessed                      General Comments General comments (skin integrity, edema, etc.): L knee dressings, polar care, and foley in place.  Nursing cleared pt for participation in physical therapy.  Pt agreeable to PT session.  Pt's daughter present during session.    Exercises Total Joint Exercises Ankle Circles/Pumps: AROM;Strengthening;Left;10 reps;Supine Quad Sets: AROM;Strengthening;Left;10 reps;Supine Short Arc Quad: AAROM;Strengthening;Left;10 reps;Supine Heel Slides: AAROM;Strengthening;Left;10 reps;Supine Hip ABduction/ADduction: AAROM;Strengthening;Left;10 reps;Supine Straight Leg Raises: AROM;Strengthening;Left;10 reps;Supine Goniometric ROM: L knee extension 18 degrees short of neutral semi-supine; L knee flexion 80 degrees AROM in sitting   Assessment/Plan    PT Assessment Patient needs continued PT services  PT Problem List Decreased strength;Decreased range of motion;Decreased mobility;Pain          PT Treatment Interventions DME instruction;Gait training;Stair training;Functional mobility training;Therapeutic activities;Therapeutic exercise;Balance training;Patient/family education    PT Goals (Current goals can be found in the Care Plan section)  Acute Rehab PT Goals Patient Stated Goal: to go home PT Goal Formulation: With patient/family Time For Goal Achievement: 04/18/16 Potential to Achieve Goals: Good    Frequency BID   Barriers to discharge        Co-evaluation               End of Session Equipment Utilized During Treatment: Gait belt Activity Tolerance: Patient tolerated treatment well Patient left: in chair;with  call bell/phone within reach;with chair alarm set;with family/visitor present;with SCD's reapplied (B heels elevated via towel rolls; polar care in place and activated) Nurse Communication: Mobility status;Precautions;Weight bearing status         Time: 2957-4734 PT Time Calculation (min) (ACUTE ONLY): 35 min   Charges:   PT Evaluation $PT Eval Low Complexity: 1 Procedure PT Treatments $Therapeutic Exercise: 8-22 mins   PT G CodesLeitha Bleak 05/03/16, 5:42 PM Leitha Bleak, [place]

## 2016-04-04 NOTE — H&P (Signed)
Reviewed paper H+P, will be scanned into chart. No changes noted.  

## 2016-04-04 NOTE — Anesthesia Preprocedure Evaluation (Signed)
Anesthesia Evaluation  Patient identified by MRN, date of birth, ID band Patient awake    Reviewed: Allergy & Precautions, NPO status , Patient's Chart, lab work & pertinent test results  History of Anesthesia Complications Negative for: history of anesthetic complications  Airway Mallampati: II  TM Distance: >3 FB Neck ROM: Full    Dental  (+) Lower Dentures, Upper Dentures   Pulmonary neg sleep apnea, COPD: listed in chart, patient denies, not on any inhalers.,    breath sounds clear to auscultation- rhonchi (-) wheezing      Cardiovascular hypertension, Pt. on medications (-) CAD and (-) Past MI  Rhythm:Regular Rate:Normal - Systolic murmurs and - Diastolic murmurs    Neuro/Psych Anxiety Depression negative neurological ROS     GI/Hepatic Neg liver ROS, GERD  ,  Endo/Other  negative endocrine ROSneg diabetes  Renal/GU negative Renal ROS     Musculoskeletal  (+) Arthritis , Osteoarthritis,    Abdominal (+) - obese,   Peds  Hematology  (+) anemia ,   Anesthesia Other Findings Past Medical History: No date: Anemia No date: Anxiety No date: Arthritis     Comment: rheumatoid No date: Ataxia No date: COPD (chronic obstructive pulmonary disease) (* No date: Depression No date: Diverticulosis No date: Excessive falling No date: GERD (gastroesophageal reflux disease) No date: Hyperlipidemia No date: Hypertension No date: IBS (irritable bowel syndrome) No date: Osteoarthritis No date: Psoriasis No date: Raynaud's disease without gangrene No date: Raynaud's disease without gangrene No date: Shortness of breath dyspnea     Comment: with exertion   Reproductive/Obstetrics                             Anesthesia Physical Anesthesia Plan  ASA: III  Anesthesia Plan: Spinal   Post-op Pain Management:    Induction:   Airway Management Planned: Natural Airway  Additional Equipment:    Intra-op Plan:   Post-operative Plan:   Informed Consent: I have reviewed the patients History and Physical, chart, labs and discussed the procedure including the risks, benefits and alternatives for the proposed anesthesia with the patient or authorized representative who has indicated his/her understanding and acceptance.     Plan Discussed with: CRNA and Anesthesiologist  Anesthesia Plan Comments:         Lab Results  Component Value Date   WBC 9.6 03/22/2016   HGB 12.1 03/22/2016   HCT 35.3 03/22/2016   MCV 93.6 03/22/2016   PLT 276 03/22/2016    Anesthesia Quick Evaluation

## 2016-04-05 ENCOUNTER — Encounter: Payer: Self-pay | Admitting: Orthopedic Surgery

## 2016-04-05 LAB — BASIC METABOLIC PANEL
ANION GAP: 6 (ref 5–15)
BUN: 20 mg/dL (ref 6–20)
CALCIUM: 8.6 mg/dL — AB (ref 8.9–10.3)
CO2: 26 mmol/L (ref 22–32)
CREATININE: 0.61 mg/dL (ref 0.44–1.00)
Chloride: 105 mmol/L (ref 101–111)
GFR calc Af Amer: >60 mL/min (ref >60)
GLUCOSE: 202 mg/dL — AB (ref 65–99)
Potassium: 4.3 mmol/L (ref 3.5–5.1)
Sodium: 137 mmol/L (ref 135–145)

## 2016-04-05 LAB — CBC
HEMATOCRIT: 29.9 % — AB (ref 35.0–47.0)
Hemoglobin: 9.9 g/dL — ABNORMAL LOW (ref 12.0–16.0)
MCH: 31.5 pg (ref 26.0–34.0)
MCHC: 33 g/dL (ref 32.0–36.0)
MCV: 95.4 fL (ref 80.0–100.0)
PLATELETS: 245 10*3/uL (ref 150–440)
RBC: 3.13 MIL/uL — ABNORMAL LOW (ref 3.80–5.20)
RDW: 13.9 % (ref 11.5–14.5)
WBC: 17.5 10*3/uL — AB (ref 3.6–11.0)

## 2016-04-05 MED ORDER — LEFLUNOMIDE 20 MG PO TABS
20.0000 mg | ORAL_TABLET | Freq: Every day | ORAL | Status: DC
  Administered 2016-04-05 – 2016-04-07 (×3): 20 mg via ORAL
  Filled 2016-04-05 (×3): qty 1

## 2016-04-05 MED ORDER — INFLUENZA VAC SPLIT QUAD 0.5 ML IM SUSY
0.5000 mL | PREFILLED_SYRINGE | INTRAMUSCULAR | Status: AC
  Administered 2016-04-06: 0.5 mL via INTRAMUSCULAR
  Filled 2016-04-05: qty 0.5

## 2016-04-05 MED ORDER — PNEUMOCOCCAL VAC POLYVALENT 25 MCG/0.5ML IJ INJ
0.5000 mL | INJECTION | INTRAMUSCULAR | Status: AC
  Administered 2016-04-06: 0.5 mL via INTRAMUSCULAR
  Filled 2016-04-05: qty 0.5

## 2016-04-05 NOTE — Progress Notes (Signed)
Inpatient Diabetes Program Recommendations  AACE/ADA: New Consensus Statement on Inpatient Glycemic Control (2015)  Target Ranges:  Prepandial:   less than 140 mg/dL      Peak postprandial:   less than 180 mg/dL (1-2 hours)      Critically ill patients:  140 - 180 mg/dL   Results for Melissa [name], Melissa [name] (MRN [id number]) as of 04/05/2016 13:07  Ref. Range 04/05/2016 03:56  Glucose Latest Ref Range: 65 - 99 mg/dL 202 (H)   Review of Glycemic Control  Diabetes history: NO Outpatient Diabetes medications: NA Current orders for Inpatient glycemic control: None  Inpatient Diabetes Program Recommendations: Correction (SSI): While inpatient and ordered steroids, may want to consider ordering CBGs with Novolog correction scale. A1C: May want to consider ordering an A1C to evaluate glycemic control over the past 2-3 months.   Thanks, Barnie Alderman, RN, MSN, CDE Diabetes Coordinator Inpatient Diabetes Program [phone] (Team Pager from [place] to [place]) [phone] ([facility] office) [phone] [facility] office) [phone] [facility] office)

## 2016-04-05 NOTE — Evaluation (Signed)
Occupational Therapy Evaluation Patient Details Name: CLEOPHA INDELICATO MRN: [id number] DOB: 1940/08/22 Today's Date: 04/05/2016    History of Present Illness Pt. is a 75 y.o. female who was admitted for a Left TKR secondary to OA.    Clinical Impression   Pt. Is a 75 y.o. female who was admitted for a Left TKR. Pt presents with limited ROM, Pain, weakness, and impaired functional mobility which hinder her ability to complete ADL and IADL tasks. Pt. could benefit from skilled OT services to review A/E use for LE ADLs, to review necessary home modifications, and to improve functional mobility for ADL/IADLs in order to work towards regaining Independence with ADL/IADLs.     Follow Up Recommendations  No OT follow up    Equipment Recommendations       Recommendations for Other Services PT consult     Precautions / Restrictions Precautions Precautions: Fall;Knee Restrictions Weight Bearing Restrictions: Yes LLE Weight Bearing: Weight bearing as tolerated                                                     ADL Overall ADL's : Needs assistance/impaired Eating/Feeding: Set up   Grooming: Set up               Lower Body Dressing: Moderate assistance               Functional mobility during ADLs: Min guard General ADL Comments: Pt. education was provided about A/E use for LE ADLs.     Vision     Perception     Praxis      Pertinent Vitals/Pain Pain Assessment: 0-10 Pain Score: 6  Pain Location: Left knee Pain Descriptors / Indicators: Sore Pain Intervention(s): Limited activity within patient's tolerance;Monitored during session;Repositioned     Hand Dominance Right   Extremity/Trunk Assessment Upper Extremity Assessment Upper Extremity Assessment: Generalized weakness           Communication Communication Communication: No difficulties   Cognition                           General Comments       Exercises        Shoulder Instructions      Home Living Family/patient expects to be discharged to:: Private residence Living Arrangements: Spouse/significant other Available Help at Discharge: Family Type of Home: House Home Access: Level entry;Other (comment)     Home Layout: One level     Bathroom Shower/Tub: Walk-in Hydrologist: Standard     Home Equipment: Bedside commode          Prior Functioning/Environment Level of Independence: Independent                 OT Problem List: Decreased strength;Pain;Decreased knowledge of use of DME or AE;Decreased activity tolerance   OT Treatment/Interventions: Self-care/ADL training;Therapeutic exercise;Energy conservation;Therapeutic activities;DME and/or AE instruction    OT Goals(Current goals can be found in the care plan section) Acute Rehab OT Goals Patient Stated Goal: To be able to do the things she used to do pain free OT Goal Formulation: With patient Potential to Achieve Goals: Good  OT Frequency: Min 1X/week   Barriers to D/C:            Co-evaluation  End of Session    Activity Tolerance: Patient tolerated treatment well Patient left: in chair;with chair alarm set;with call bell/phone within reach   Time: 1110-1135 OT Time Calculation (min): 25 min Charges:  OT General Charges $OT Visit: 1 Procedure OT Evaluation $OT Eval Moderate Complexity: 1 Procedure OT Treatments $Self Care/Home Management : 8-22 mins G-Codes:    Harrel Carina, MS, OTR/L 04/05/2016, 11:43 AM

## 2016-04-05 NOTE — Progress Notes (Signed)
   Subjective: 1 Day Post-Op Procedure(s) (LRB): TOTAL KNEE ARTHROPLASTY (Left) Patient reports pain as 1 on 0-10 scale.   Patient is well, and has had no acute complaints or problems Denies any CP, SOB, ABD pain. We will continue therapy today.  Plan is to go Home after hospital stay.  Objective: Vital signs in last 24 hours: Temp:  [97.4 F (36.3 C)-98.3 F (36.8 C)] 98 F (36.7 C) (09/20 0340) Pulse Rate:  [56-82] 82 (09/20 0340) Resp:  [13-19] 19 (09/20 0340) BP: (92-136)/(43-77) 130/54 (09/20 0340) SpO2:  [92 %-100 %] 94 % (09/20 0340) FiO2 (%):  [28 %] 28 % (09/19 1600)  Intake/Output from previous day: 09/19 0701 - 09/20 0700 In: 2777.5 [P.O.:480; I.V.:2297.5] Out: 2100 [Urine:2090; Blood:10] Intake/Output this shift: No intake/output data recorded.   Recent Labs  04/04/16 1655 04/05/16 0356  HGB 10.6* 9.9*    Recent Labs  04/04/16 1655 04/05/16 0356  WBC 11.2* 17.5*  RBC 3.34* 3.13*  HCT 31.7* 29.9*  PLT 234 245    Recent Labs  04/04/16 1655 04/05/16 0356  NA  --  137  K  --  4.3  CL  --  105  CO2  --  26  BUN  --  20  CREATININE 0.76 0.61  GLUCOSE  --  202*  CALCIUM  --  8.6*   No results for input(s): LABPT, INR in the last 72 hours.  EXAM General - Patient is Alert, Appropriate and Oriented Extremity - Neurovascular intact Sensation intact distally Intact pulses distally Dorsiflexion/Plantar flexion intact No cellulitis present Compartment soft Dressing - no drainage Motor Function - intact, moving foot and toes well on exam.   Past Medical History:  Diagnosis Date  . Anemia   . Anxiety   . Arthritis    rheumatoid  . Ataxia   . COPD (chronic obstructive pulmonary disease) ([place])   . Depression   . Diverticulosis   . Excessive falling   . GERD (gastroesophageal reflux disease)   . Hyperlipidemia   . Hypertension   . IBS (irritable bowel syndrome)   . Osteoarthritis   . Psoriasis   . Raynaud's disease without gangrene    . Raynaud's disease without gangrene   . Shortness of breath dyspnea    with exertion    Assessment/Plan:   1 Day Post-Op Procedure(s) (LRB): TOTAL KNEE ARTHROPLASTY (Left) Active Problems:   Other secondary osteoarthritis of one knee  Estimated body mass index is 22.83 kg/m as calculated from the following:   Height as of this encounter: 5' 4"  (1.626 m).   Weight as of this encounter: 60.3 kg (133 lb). Advance diet Up with therapy  Needs BM Acute post op blood loss anemia - recheck labs in the am CM to assist with discharge, home with HHPT Friday  DVT Prophylaxis - Lovenox, Foot Pumps and TED hose Weight-Bearing as tolerated to left leg   T. Rachelle Hora, PA-C [place] 04/05/2016, 7:35 AM

## 2016-04-05 NOTE — Progress Notes (Signed)
Clinical Social Worker (CSW) received SNF consult. PT is recommending home health. RN case manager aware of above. Please reconsult if future social work needs arise. CSW signing off.   Shyna Duignan, LCSW [phone] 

## 2016-04-05 NOTE — Care Management Note (Signed)
Case Management Note  Patient Details  Name: Melissa [name] MRN: [id number] Date of Birth: 08/04/1940  Subjective/Objective:   POD 1  Left TKA. Met with patient at bedside. She lives at home with her spouse. Her daughter will be coming in to care for her. She has a cane, walker and BSC. She uses [facility] [phone]. Called Lovenox 40 mg QD # 14. Offered choice of home health agency. No preference. Referral called to [facility]               Action/Plan: [facility]. Lovenox to [facility]  Expected Discharge Date:                  Expected Discharge Plan:  [name]  In-House Referral:     Discharge planning Services     Post Acute Care Choice:  Home Health Choice offered to:  Patient  DME Arranged:    DME Agency:     HH Arranged:  PT [place]:  [facility] (now [facility])  Status of Service:  In process, will continue to follow  If discussed at Long Length of Stay Meetings, dates discussed:    Additional Comments:  Jolly Mango, RN 04/05/2016, 12:06 PM

## 2016-04-05 NOTE — Progress Notes (Addendum)
Physical Therapy Treatment Patient Details Name: Melissa [name] MRN: [id number] DOB: [birth year] Today's Date: 04/05/2016    History of Present Illness Pt is s/p L TKA secondary to OA 04/04/16.  PMH includes anxiety, RA, ataxia, COPD, excessive falling, htn, psoriasis, and Raynaud's disease.    PT Comments    Pt able to progress ambulation distance to 50 feet with RW CGA (pt with mildly increasing antalgic gait with distance).  Pt continues to progress well towards PT goals and appears very motivated to participate in therapy activities.  Continue to recommend home discharge.  Will continue to progress pt with LE ex's, L knee ROM, and progressive functional mobility per pt tolerance.   Follow Up Recommendations  Home health PT     Equipment Recommendations  Rolling walker with 5" wheels (pt's daughter to bring in to assess fit/safety)    Recommendations for Other Services       Precautions / Restrictions Precautions Precautions: Fall;Knee Precaution Comments: KI if unable to perform SLR x10 reps Restrictions Weight Bearing Restrictions: Yes LLE Weight Bearing: Weight bearing as tolerated    Mobility  Bed Mobility Overal bed mobility: Needs Assistance Bed Mobility: Supine to Sit     Supine to sit: Supervision;HOB elevated     General bed mobility comments: mild increased time to perform  Transfers Overall transfer level: Needs assistance Equipment used: Rolling walker (2 wheeled) Transfers: Sit to/from Stand Sit to Stand: Min guard         General transfer comment: vc's required for hand and feet positioning; steady without loss of balance  Ambulation/Gait Ambulation/Gait assistance: Min guard Ambulation Distance (Feet): 50 Feet Assistive device: Rolling walker (2 wheeled) Gait Pattern/deviations: Step-to pattern Gait velocity: mildly decreased   General Gait Details: vc's for gait pattern and walker use required; decreased stance time L LE; mildly antalgic with  distance   Stairs            Wheelchair Mobility    Modified Rankin (Stroke Patients Only)       Balance Overall balance assessment: Needs assistance Sitting-balance support: No upper extremity supported;Feet supported Sitting balance-Leahy Scale: Normal     Standing balance support: Bilateral upper extremity supported;During functional activity (RW use) Standing balance-Leahy Scale: Good                      Cognition Arousal/Alertness: Awake/alert Behavior During Therapy: WFL for tasks assessed/performed Overall Cognitive Status: Within Functional Limits for tasks assessed                      Exercises Total Joint Exercises Ankle Circles/Pumps: AROM;Strengthening;10 reps;Supine;Both Quad Sets: AROM;Strengthening;10 reps;Supine;Both Short Arc Quad: AROM;Strengthening;Left;10 reps;Supine Heel Slides: AAROM;Strengthening;Left;10 reps;Supine Hip ABduction/ADduction: AROM;Strengthening;Left;10 reps;Supine Straight Leg Raises: AROM;Strengthening;Left;10 reps;Supine Goniometric ROM: L knee extension 15 degrees short of neutral semi-supine; L knee flexion 82 degrees AROM in sitting    General Comments General comments (skin integrity, edema, etc.): L knee dressings and polar care in place.  Pt agreeable to PT session.      Pertinent Vitals/Pain Pain Assessment: 0-10 Pain Score: 4  Pain Location: L knee Pain Descriptors / Indicators: Tender;Sore Pain Intervention(s): Limited activity within patient's tolerance;Monitored during session;Premedicated before session;Repositioned;Ice applied  Vitals stable and WFL throughout treatment session (HR and O2).    Home Living Family/patient expects to be discharged to:: Private residence Living Arrangements: Spouse/significant other Available Help at Discharge: Family Type of Home: House Home Access: Level entry;Other (comment)   Home  Layout: One level Home Equipment: Bedside commode      Prior  Function Level of Independence: Independent          PT Goals (current goals can now be found in the care plan section) Acute Rehab PT Goals Patient Stated Goal: to be able to walk without pain PT Goal Formulation: With patient Time For Goal Achievement: 04/18/16 Potential to Achieve Goals: Good Additional Goals Additional Goal #1: L knee ROM 0-90 degrees. Progress towards PT goals: Progressing toward goals    Frequency    BID      PT Plan Current plan remains appropriate    Co-evaluation             End of Session Equipment Utilized During Treatment: Gait belt Activity Tolerance: Patient tolerated treatment well Patient left: in chair;with call bell/phone within reach;with chair alarm set;with family/visitor present;with SCD's reapplied; B heels elevated via towel roll; polar care in place and activated     Time: 7579-7282 PT Time Calculation (min) (ACUTE ONLY): 28 min  Charges:  $Gait Training: 8-22 mins $Therapeutic Exercise: 8-22 mins                    G CodesLeitha Bleak May 02, 2016, 2:59 PM Leitha Bleak, [place]

## 2016-04-05 NOTE — Care Management (Signed)
Cost of lovenox is $111.87. Patient updated.

## 2016-04-05 NOTE — Progress Notes (Signed)
Physical Therapy Treatment Patient Details Name: Melissa [name] MRN: [id number] DOB: [birth year] Today's Date: 04/05/2016    History of Present Illness Pt is s/p L TKA secondary to OA 04/04/16.  PMH includes anxiety, RA, ataxia, COPD, excessive falling, htn, psoriasis, and Raynaud's disease.    PT Comments    Pt able to progress ambulation distance to 90 feet with RW CGA.  Pt tolerated functional mobility during session fairly well with L knee pain 4/10 at most.  Pt still requiring intermittent cueing for transfer and gait technique but does fairly well once given initial cues.  Will continue to progress pt with LE strengthening, L knee ROM, and progressive functional mobility per pt tolerance.   Follow Up Recommendations  Home health PT     Equipment Recommendations  Rolling walker with 5" wheels (pt's daughter to bring pt's RW in to assess fit/safety)    Recommendations for Other Services       Precautions / Restrictions Precautions Precautions: Fall;Knee Precaution Comments: KI if unable to perform SLR x10 reps Restrictions Weight Bearing Restrictions: Yes LLE Weight Bearing: Weight bearing as tolerated    Mobility  Bed Mobility Overal bed mobility: Needs Assistance Bed Mobility: Sit to Supine     Sit to supine: Supervision   General bed mobility comments: mild increased time to perform; bed flat; use of bed rail  Transfers Overall transfer level: Needs assistance Equipment used: Rolling walker (2 wheeled) Transfers: Sit to/from Stand Sit to Stand: Supervision;Min guard         General transfer comment: vc's required for hand and feet positioning intermittently; steady without loss of balance  Ambulation/Gait Ambulation/Gait assistance: Min guard Ambulation Distance (Feet): 90 Feet Assistive device: Rolling walker (2 wheeled) Gait Pattern/deviations: Step-to pattern Gait velocity: mildly decreased   General Gait Details: vc's for gait pattern and walker use  required; decreased stance time L LE; mildly antalgic with distance   Stairs            Wheelchair Mobility    Modified Rankin (Stroke Patients Only)       Balance Overall balance assessment: Needs assistance Sitting-balance support: No upper extremity supported;Feet supported Sitting balance-Leahy Scale: Normal     Standing balance support: Bilateral upper extremity supported;During functional activity (RW use) Standing balance-Leahy Scale: Good                      Cognition Arousal/Alertness: Awake/alert Behavior During Therapy: WFL for tasks assessed/performed Overall Cognitive Status: Within Functional Limits for tasks assessed                      Exercises Total Joint Exercises Long Arc Quad: AROM;Strengthening;Both;10 reps;Seated Knee Flexion: AAROM;Left;10 reps;Seated (towel under L foot to assist with sliding on floor; 3 second holds each rep at end range)    General Comments General comments (skin integrity, edema, etc.): L knee dressings and polar care in place.  Pt agreeable to PT session.      Pertinent Vitals/Pain Pain Assessment: 0-10 Pain Score: 4  Pain Location: L knee Pain Descriptors / Indicators: Tender;Sore Pain Intervention(s): Limited activity within patient's tolerance;Monitored during session;Premedicated before session;Repositioned;Ice applied  Vitals stable and WFL throughout treatment session (HR and O2).    Home Living Family/patient expects to be discharged to:: Private residence Living Arrangements: Spouse/significant other Available Help at Discharge: Family Type of Home: House Home Access: Level entry;Other (comment)   Home Layout: One level Home Equipment: Bedside commode  Prior Function Level of Independence: Independent          PT Goals (current goals can now be found in the care plan section) Acute Rehab PT Goals Patient Stated Goal: to be able to walk without pain PT Goal Formulation: With  patient Time For Goal Achievement: 04/18/16 Potential to Achieve Goals: Good Additional Goals Additional Goal #1: L knee ROM 0-90 degrees. Progress towards PT goals: Progressing toward goals    Frequency    BID      PT Plan Current plan remains appropriate    Co-evaluation             End of Session Equipment Utilized During Treatment: Gait belt Activity Tolerance: Patient tolerated treatment well Patient left: in bed;with call bell/phone within reach;with bed alarm set;with family/visitor present;with SCD's reapplied (B heels elevated via towel roll and Zero Degree Bone Foam; polar care in place and activated)     Time: 3524-8185 PT Time Calculation (min) (ACUTE ONLY): 39 min  Charges:  $Gait Training: 8-22 mins $Therapeutic Exercise: 8-22 mins $Therapeutic Activity: 8-22 mins                    G CodesLeitha Bleak 2016/04/07, 3:17 PM Leitha Bleak, [place]

## 2016-04-06 LAB — CBC
HEMATOCRIT: 31.2 % — AB (ref 35.0–47.0)
HEMOGLOBIN: 10.4 g/dL — AB (ref 12.0–16.0)
MCH: 32.2 pg (ref 26.0–34.0)
MCHC: 33.4 g/dL (ref 32.0–36.0)
MCV: 96.2 fL (ref 80.0–100.0)
Platelets: 252 10*3/uL (ref 150–440)
RBC: 3.24 MIL/uL — AB (ref 3.80–5.20)
RDW: 14.1 % (ref 11.5–14.5)
WBC: 22.6 10*3/uL — ABNORMAL HIGH (ref 3.6–11.0)

## 2016-04-06 MED ORDER — LACTULOSE 10 GM/15ML PO SOLN
10.0000 g | Freq: Once | ORAL | Status: AC
Start: 2016-04-06 — End: 2016-04-06
  Administered 2016-04-06: 10 g via ORAL
  Filled 2016-04-06: qty 30

## 2016-04-06 MED ORDER — ENOXAPARIN SODIUM 40 MG/0.4ML ~~LOC~~ SOLN: 40.0000 mg | SUBCUTANEOUS | 0 refills | Status: DC

## 2016-04-06 MED ORDER — TRAMADOL HCL 50 MG PO TABS: 100.0000 mg | ORAL_TABLET | Freq: Two times a day (BID) | ORAL | 0 refills | Status: DC

## 2016-04-06 MED ORDER — OXYCODONE HCL 5 MG PO TABS: 5.0000 mg | ORAL_TABLET | ORAL | 0 refills | Status: DC | PRN

## 2016-04-06 NOTE — Progress Notes (Signed)
   Subjective: 2 Days Post-Op Procedure(s) (LRB): TOTAL KNEE ARTHROPLASTY (Left) Patient reports pain as 1 on 0-10 scale.   Patient is well, and has had no acute complaints or problems Denies any CP, SOB, ABD pain. We will continue therapy today.  Plan is to go Home after hospital stay.  Objective: Vital signs in last 24 hours: Temp:  [97.2 F (36.2 C)-98.2 F (36.8 C)] 98.2 F (36.8 C) (09/21 0404) Pulse Rate:  [67-82] 77 (09/21 0404) Resp:  [16-18] 16 (09/21 0404) BP: (119-143)/(46-68) 138/68 (09/21 0404) SpO2:  [92 %-99 %] 94 % (09/21 0404)  Intake/Output from previous day: 09/20 0701 - 09/21 0700 In: 1095 [P.O.:720; I.V.:375] Out: 0  Intake/Output this shift: No intake/output data recorded.   Recent Labs  04/04/16 1655 04/05/16 0356 04/06/16 0540  HGB 10.6* 9.9* 10.4*    Recent Labs  04/05/16 0356 04/06/16 0540  WBC 17.5* 22.6*  RBC 3.13* 3.24*  HCT 29.9* 31.2*  PLT 245 252    Recent Labs  04/04/16 1655 04/05/16 0356  NA  --  137  K  --  4.3  CL  --  105  CO2  --  26  BUN  --  20  CREATININE 0.76 0.61  GLUCOSE  --  202*  CALCIUM  --  8.6*   No results for input(s): LABPT, INR in the last 72 hours.  EXAM General - Patient is Alert, Appropriate and Oriented Extremity - Neurovascular intact Sensation intact distally Intact pulses distally Dorsiflexion/Plantar flexion intact No cellulitis present Compartment soft Dressing - no drainage, dressing changed, CDI Motor Function - intact, moving foot and toes well on exam.   Past Medical History:  Diagnosis Date  . Anemia   . Anxiety   . Arthritis    rheumatoid  . Ataxia   . COPD (chronic obstructive pulmonary disease) ([place])   . Depression   . Diverticulosis   . Excessive falling   . GERD (gastroesophageal reflux disease)   . Hyperlipidemia   . Hypertension   . IBS (irritable bowel syndrome)   . Osteoarthritis   . Psoriasis   . Raynaud's disease without gangrene   . Raynaud's  disease without gangrene   . Shortness of breath dyspnea    with exertion    Assessment/Plan:   2 Days Post-Op Procedure(s) (LRB): TOTAL KNEE ARTHROPLASTY (Left) Active Problems:   Other secondary osteoarthritis of one knee  Estimated body mass index is 22.83 kg/m as calculated from the following:   Height as of this encounter: 5' 4"  (1.626 m).   Weight as of this encounter: 60.3 kg (133 lb). Advance diet Up with therapy  Needs BM Acute post op blood loss anemia - Hgb stable, trending up Plan on discharge home with HHPT Friday  DVT Prophylaxis - Lovenox, Foot Pumps and TED hose Weight-Bearing as tolerated to left leg   T. Rachelle Hora, PA-C [place] 04/06/2016, 7:56 AM

## 2016-04-06 NOTE — Progress Notes (Signed)
Physical Therapy Treatment Patient Details Name: Melissa [name] MRN: [id number] DOB: [birth year] Today's Date: 04/06/2016    History of Present Illness Pt is s/p L TKA secondary to OA 04/04/16.  PMH includes anxiety, RA, ataxia, COPD, excessive falling, htn, psoriasis, and Raynaud's disease.    PT Comments    Pt able to progress ambulation distance to 160 feet with RW CGA; no loss of balance noted and pt requiring minimal vc's for safe gait technique.  L knee pain 4/10 at most during session.  Plan to attempt stairs this afternoon.   Follow Up Recommendations  Home health PT     Equipment Recommendations  Rolling walker with 5" wheels (Pt's RW brought in and fit for pt's use)    Recommendations for Other Services       Precautions / Restrictions Precautions Precautions: Fall;Knee Precaution Comments: KI if unable to perform SLR x10 reps Restrictions Weight Bearing Restrictions: Yes LLE Weight Bearing: Weight bearing as tolerated    Mobility  Bed Mobility Overal bed mobility: Needs Assistance Bed Mobility: Supine to Sit     Supine to sit: Modified independent (Device/Increase time)     General bed mobility comments: mild increased time to perform; bed flat; use of bed rail  Transfers Overall transfer level: Needs assistance Equipment used: Rolling walker (2 wheeled) Transfers: Sit to/from Stand Sit to Stand: Supervision;Min guard         General transfer comment: no vc's for hand and feet positioning required; steady without loss of balance  Ambulation/Gait Ambulation/Gait assistance: Min guard Ambulation Distance (Feet): 160 Feet Assistive device: Rolling walker (2 wheeled) Gait Pattern/deviations: Step-to pattern Gait velocity: mildly decreased   General Gait Details: minimal vc's for gait pattern and walker use; decreased stance time L LE   Stairs            Wheelchair Mobility    Modified Rankin (Stroke Patients Only)       Balance Overall  balance assessment: Needs assistance Sitting-balance support: No upper extremity supported;Feet supported Sitting balance-Leahy Scale: Normal     Standing balance support: Bilateral upper extremity supported;During functional activity (on RW) Standing balance-Leahy Scale: Good                      Cognition Arousal/Alertness: Awake/alert Behavior During Therapy: WFL for tasks assessed/performed Overall Cognitive Status: Within Functional Limits for tasks assessed                      Exercises Total Joint Exercises Ankle Circles/Pumps: AROM;Strengthening;10 reps;Supine;Both Quad Sets: AROM;Strengthening;10 reps;Supine;Both Short Arc Quad: AROM;Strengthening;Left;10 reps;Supine Heel Slides: AAROM;Strengthening;Left;10 reps;Supine Hip ABduction/ADduction: AROM;Strengthening;Left;10 reps;Supine Straight Leg Raises: AROM;Strengthening;Left;10 reps;Supine Goniometric ROM: L knee extension 10 degrees short of neutral semi-supine; L knee flexion 95 degrees AROM in sitting    General Comments General comments (skin integrity, edema, etc.): L knee dressing in place with minimal drainage noted; polar care in place.  Pt agreeable to PT session.      Pertinent Vitals/Pain Pain Assessment: 0-10 Pain Score: 4  Pain Location: L knee Pain Descriptors / Indicators: Sore;Tender Pain Intervention(s): Limited activity within patient's tolerance;Monitored during session;RN gave pain meds during session;Repositioned;Ice applied  HR WFL during session.    Home Living                      Prior Function            PT Goals (current goals can now be found  in the care plan section) Acute Rehab PT Goals Patient Stated Goal: to be able to walk without pain PT Goal Formulation: With patient Time For Goal Achievement: 04/18/16 Potential to Achieve Goals: Good Additional Goals Additional Goal #1: L knee ROM 0-90 degrees. Progress towards PT goals: Progressing toward  goals    Frequency    BID      PT Plan Current plan remains appropriate    Co-evaluation             End of Session Equipment Utilized During Treatment: Gait belt Activity Tolerance: Patient tolerated treatment well Patient left: in chair;with call bell/phone within reach;with chair alarm set;with SCD's reapplied (B heels elevated via towel rolls; polar care in place and activated.)     Time: 9604-5409 PT Time Calculation (min) (ACUTE ONLY): 43 min  Charges:  $Gait Training: 8-22 mins $Therapeutic Exercise: 8-22 mins $Therapeutic Activity: 8-22 mins                    G CodesLeitha Bleak 2016/04/30, 4:06 PM Leitha Bleak, [place]

## 2016-04-06 NOTE — Discharge Summary (Signed)
Physician Discharge Summary  Patient ID: Melissa [name] MRN: [id number] DOB/AGE: 08-27-1940 75 y.o.  Admit date: 04/04/2016 Discharge date: 04/07/2016 Admission Diagnoses:  other secondary osteoarthritis   Discharge Diagnoses: Patient Active Problem List   Diagnosis Date Noted  . Other secondary osteoarthritis of one knee 04/04/2016    Past Medical History:  Diagnosis Date  . Anemia   . Anxiety   . Arthritis    rheumatoid  . Ataxia   . COPD (chronic obstructive pulmonary disease) ([place])   . Depression   . Diverticulosis   . Excessive falling   . GERD (gastroesophageal reflux disease)   . Hyperlipidemia   . Hypertension   . IBS (irritable bowel syndrome)   . Osteoarthritis   . Psoriasis   . Raynaud's disease without gangrene   . Raynaud's disease without gangrene   . Shortness of breath dyspnea    with exertion     Transfusion: none   Consultants (if any):   Discharged Condition: Improved  Hospital Course: Melissa [name] is an 75 y.o. female who was admitted 04/04/2016 with a diagnosis of left knee osteoarthritis and went to the operating room on 04/04/2016 and underwent the above named procedures.    Surgeries: Procedure(s): TOTAL KNEE ARTHROPLASTY on 04/04/2016 Patient tolerated the surgery well. Taken to PACU where she was stabilized and then transferred to the orthopedic floor.  Started on Lovenox 30 q 12 hrs. Foot pumps applied bilaterally at 80 mm. Heels elevated on bed with rolled towels. No evidence of DVT. Negative [place]. Physical therapy started on day #1 for gait training and transfer. OT started day #1 for ADL and assisted devices.  Patient's foley was d/c on day #1. Patient's IV and hemovac was d/c on day #2.  On post op day #3 patient was stable and ready for discharge to home with HHPT.  Implants: Medacta GMK sphere left 3+ femur, 3 tibia with stem and 11 mm insert, to patella all components cemented  She was given perioperative antibiotics:   Anti-infectives    Start     Dose/Rate Route Frequency Ordered Stop   04/04/16 1400  ceFAZolin (ANCEF) IVPB 1 g/50 mL premix     1 g 100 mL/hr over 30 Minutes Intravenous Every 6 hours 04/04/16 1027 04/04/16 2128   04/04/16 1100  hydroxychloroquine (PLAQUENIL) tablet 200 mg     200 mg Oral 2 times daily 04/04/16 1027     04/04/16 0609  ceFAZolin (ANCEF) 2-4 GM/100ML-% IVPB    Comments:  Veda Canning: cabinet override      04/04/16 0609 04/04/16 0735   04/04/16 0500  ceFAZolin (ANCEF) IVPB 2g/100 mL premix     2 g 200 mL/hr over 30 Minutes Intravenous  Once 04/04/16 0457 04/04/16 0750    .  She was given sequential compression devices, early ambulation, and lovenox for DVT prophylaxis.  She benefited maximally from the hospital stay and there were no complications.    Recent vital signs:  Vitals:   04/06/16 0404 04/06/16 0825  BP: 138/68 (!) 154/75  Pulse: 77 86  Resp: 16 18  Temp: 98.2 F (36.8 C) 98 F (36.7 C)    Recent laboratory studies:  Lab Results  Component Value Date   HGB 10.4 (L) 04/06/2016   HGB 9.9 (L) 04/05/2016   HGB 10.6 (L) 04/04/2016   Lab Results  Component Value Date   WBC 22.6 (H) 04/06/2016   PLT 252 04/06/2016   Lab Results  Component Value Date  INR 1.06 03/22/2016   Lab Results  Component Value Date   NA 137 04/05/2016   K 4.3 04/05/2016   CL 105 04/05/2016   CO2 26 04/05/2016   BUN 20 04/05/2016   CREATININE 0.61 04/05/2016   GLUCOSE 202 (H) 04/05/2016    Discharge Medications:     Medication List    STOP taking these medications   aspirin 325 MG tablet     TAKE these medications   CALCIUM 600+D 600-400 MG-UNIT tablet Generic drug:  Calcium Carbonate-Vitamin D Take 1 tablet by mouth 2 (two) times daily.   diltiazem 240 MG 24 hr capsule Commonly known as:  TIAZAC Take 240 mg by mouth daily.   diltiazem 240 MG 24 hr capsule Commonly known as:  CARDIZEM CD Take 240 mg by mouth daily.   enoxaparin 40  MG/0.4ML injection Commonly known as:  LOVENOX Inject 0.4 mLs (40 mg total) into the skin daily.   ferrous sulfate 325 (65 FE) MG tablet Take 325 mg by mouth daily with breakfast.   gemfibrozil 600 MG tablet Commonly known as:  LOPID Take 600 mg by mouth 2 (two) times daily before a meal.   hydroxychloroquine 200 MG tablet Commonly known as:  PLAQUENIL Take 200 mg by mouth 2 (two) times daily.   leflunomide 20 MG tablet Commonly known as:  ARAVA Take 20 mg by mouth daily.   LORazepam 1 MG tablet Commonly known as:  ATIVAN Take 1 mg by mouth every 8 (eight) hours as needed.   losartan 100 MG tablet Commonly known as:  COZAAR Take 100 mg by mouth daily.   multivitamin tablet Take 1 tablet by mouth daily.   omeprazole 20 MG capsule Commonly known as:  PRILOSEC Take 20 mg by mouth 2 (two) times daily before a meal.   oxyCODONE 5 MG immediate release tablet Commonly known as:  Oxy IR/ROXICODONE Take 1-2 tablets (5-10 mg total) by mouth every 3 (three) hours as needed for breakthrough pain.   pravastatin 80 MG tablet Commonly known as:  PRAVACHOL Take 80 mg by mouth at bedtime.   predniSONE 5 MG tablet Commonly known as:  DELTASONE Take 5 mg by mouth daily.   PRESERVISION AREDS Caps Take 1 capsule by mouth 2 (two) times daily.   sertraline 50 MG tablet Commonly known as:  ZOLOFT Take 50 mg by mouth at bedtime.   traMADol 50 MG tablet Commonly known as:  ULTRAM Take by mouth every 12 (twelve) hours as needed. What changed:  Another medication with the same name was added. Make sure you understand how and when to take each.   traMADol 50 MG tablet Commonly known as:  ULTRAM Take 2 tablets (100 mg total) by mouth 2 (two) times daily. What changed:  You were already taking a medication with the same name, and this prescription was added. Make sure you understand how and when to take each.   valsartan 80 MG tablet Commonly known as:  DIOVAN Take 80 mg by mouth  daily.       Diagnostic Studies: Dg Knee 1-2 Views Left  Result Date: 04/04/2016 CLINICAL DATA:  Status post left knee replacement EXAM: LEFT KNEE - 1-2 VIEW COMPARISON:  None. FINDINGS: Left knee prosthesis is seen. Air is noted within the surgical bed. No acute bony abnormality is noted. IMPRESSION: Status post left knee replacement. Electronically Signed   By: Inez Catalina M.D.   On: 04/04/2016 10:21    Disposition: Final discharge disposition not confirmed  Follow-up Information    MENZ,MICHAEL, MD. Schedule an appointment as soon as possible for a visit in 2 week(s).   Specialty:  Orthopedic Surgery Contact information: [address] [place] [address] [phone]            Signed: Feliberto Gottron 04/06/2016, 3:54 PM

## 2016-04-06 NOTE — Progress Notes (Signed)
Physical Therapy Treatment Patient Details Name: Melissa [name] MRN: [id number] DOB: 1941-05-30 Today's Date: 04/06/2016    History of Present Illness Pt is s/p L TKA secondary to OA 04/04/16.  PMH includes anxiety, RA, ataxia, COPD, excessive falling, htn, psoriasis, and Raynaud's disease.    PT Comments    Pt able to progress to navigating 4 stairs with B railings CGA and ambulate around nursing loop with RW (initially CGA and progressed to SBA).  No loss of balance noted during session and L knee pain 4/10 at most during session.  Continue to recommend pt discharge home with support of family.  Will continue to progress pt with functional independence, LE strengthening, and L knee ROM during hospital stay.   Follow Up Recommendations  Home health PT     Equipment Recommendations  Rolling walker with 5" wheels    Recommendations for Other Services       Precautions / Restrictions Precautions Precautions: Fall;Knee Precaution Comments: KI if unable to perform SLR x10 reps Restrictions Weight Bearing Restrictions: Yes LLE Weight Bearing: Weight bearing as tolerated    Mobility  Bed Mobility Overal bed mobility: Needs Assistance Bed Mobility: Sit to Supine     Sit to supine: Modified independent (Device/Increase time)   General bed mobility comments: mild increased time to perform; bed flat; no use of bed rail  Transfers Overall transfer level: Needs assistance Equipment used: Rolling walker (2 wheeled) Transfers: Sit to/from Stand Sit to Stand: Supervision         General transfer comment: no vc's for hand and feet positioning required; steady without loss of balance; x3 trials from chair  Ambulation/Gait Ambulation/Gait assistance: Min guard;Supervision Ambulation Distance (Feet):  (40 feet (to ortho gym for stairs); 230 feet) Assistive device: Rolling walker (2 wheeled) Gait Pattern/deviations: Decreased step length - right Gait velocity: mildly decreased    General Gait Details: very minimal vc's for gait pattern and walker use; decreased stance time L LE; good step length with L LE   Stairs Stairs: Yes Stairs assistance: Min guard Stair Management: Two rails;Step to pattern;Forwards Number of Stairs: 4 General stair comments: initial vc's and demonstration for technique but then pt able to perform without additional vc's after initial step  Wheelchair Mobility    Modified Rankin (Stroke Patients Only)       Balance Overall balance assessment: Needs assistance Sitting-balance support: No upper extremity supported;Feet supported Sitting balance-Leahy Scale: Normal     Standing balance support: Bilateral upper extremity supported;During functional activity (RW use) Standing balance-Leahy Scale: Good                      Cognition Arousal/Alertness: Awake/alert Behavior During Therapy: WFL for tasks assessed/performed Overall Cognitive Status: Within Functional Limits for tasks assessed                      Exercises Total Joint Exercises Long Arc Quad: AROM;Strengthening;10 reps;Seated;Left Knee Flexion: AAROM;Left;10 reps;Seated (towel under L foot to assist with sliding on floor; 3 second holds at end range)    General Comments General comments (skin integrity, edema, etc.): L knee dressing in place with minimal drainage noted; polar care in place.  Pt agreeable to PT session.  Pt's family present for session.      Pertinent Vitals/Pain Pain Assessment: 0-10 Pain Score: 4  Pain Location: L knee Pain Descriptors / Indicators: Sore;Tender Pain Intervention(s): Limited activity within patient's tolerance;Monitored during session;Premedicated before session;Repositioned;Ice applied  HR  WFL during session.    Home Living                      Prior Function            PT Goals (current goals can now be found in the care plan section) Acute Rehab PT Goals Patient Stated Goal: to be able to  walk without pain PT Goal Formulation: With patient Time For Goal Achievement: 04/18/16 Potential to Achieve Goals: Good Additional Goals Additional Goal #1: L knee ROM 0-90 degrees. Progress towards PT goals: Progressing toward goals    Frequency    BID      PT Plan Current plan remains appropriate    Co-evaluation             End of Session Equipment Utilized During Treatment: Gait belt Activity Tolerance: Patient tolerated treatment well Patient left: in bed;with call bell/phone within reach;with bed alarm set;with family/visitor present;with SCD's reapplied (R heel elevated via towel roll; L heel elevated via Bone Foam; polar care in place and activated)     Time: 9038-3338 PT Time Calculation (min) (ACUTE ONLY): 38 min  Charges:  $Gait Training: 8-22 mins $Therapeutic Exercise: 8-22 mins $Therapeutic Activity: 8-22 mins                    G CodesLeitha Bleak 2016-04-09, 4:31 PM Leitha Bleak, [place]

## 2016-04-06 NOTE — Discharge Instructions (Signed)

## 2016-04-07 DIAGNOSIS — Z23 Encounter for immunization: Secondary | ICD-10-CM | POA: Diagnosis not present

## 2016-04-07 NOTE — Care Management Important Message (Signed)
Important Message  Patient Details  Name: Melissa [name] MRN: [id number] Date of Birth: 1940-10-04   Medicare Important Message Given:  Yes    Jolly Mango, RN 04/07/2016, 9:28 AM

## 2016-04-07 NOTE — Progress Notes (Signed)
D/c instructions discussed with pt, vs wnl at time of d/c, IV taken out. Daughter at bedside during d/c. Pain medicine given before d/c per request. Rx cards given to pt, extra Rx called in to PA. No concerns at this time.

## 2016-04-07 NOTE — Progress Notes (Signed)
Patient educated and demonstrated how to administer Lovenox. Patient was observed by myself as well as Art therapist. Daughter at bedside as well.

## 2016-04-07 NOTE — Care Management (Signed)
HHA added to discharge plan per patient request

## 2016-04-07 NOTE — Progress Notes (Signed)
Physical Therapy Treatment Patient Details Name: Melissa [name] MRN: [id number] DOB: [birth year] Today's Date: 04/07/2016    History of Present Illness Pt is s/p L TKA secondary to OA 04/04/16.  PMH includes anxiety, RA, ataxia, COPD, excessive falling, htn, psoriasis, and Raynaud's disease.    PT Comments    Pt modified independent with bed mobility, transfers, and gait with RW.  Pt steady without loss of balance and verbalizing good safety awareness with functional mobility.  Pt's daughter reports ramps installed in pt's home and doesn't have any stairs to perform.  Pt appears safe to discharge home with support of family, use of RW, and HHPT.   Follow Up Recommendations  Home health PT     Equipment Recommendations  Rolling walker with 5" wheels (pt already has RW fit for pt's use)    Recommendations for Other Services       Precautions / Restrictions Precautions Precautions: Fall;Knee Precaution Comments: KI if unable to perform SLR x10 reps Restrictions Weight Bearing Restrictions: Yes LLE Weight Bearing: Weight bearing as tolerated    Mobility  Bed Mobility Overal bed mobility: Modified Independent Bed Mobility: Supine to Sit;Sit to Supine     Supine to sit: Modified independent (Device/Increase time) Sit to supine: Modified independent (Device/Increase time)   General bed mobility comments: mild increased time to perform; bed flat; no use of bed rail  Transfers Overall transfer level: Modified independent Equipment used: Rolling walker (2 wheeled) Transfers: Sit to/from [facility] Sit to Stand: Modified independent (Device/Increase time) Stand pivot transfers: Modified independent (Device/Increase time)       General transfer comment: no vc's for hand and feet positioning required; steady without loss of balance  Ambulation/Gait Ambulation/Gait assistance: Modified independent (Device/Increase time) Ambulation Distance (Feet):  (70 feet; 230  feet) Assistive device: Rolling walker (2 wheeled) Gait Pattern/deviations: Decreased step length - right Gait velocity: mildly decreased   General Gait Details: mild decreased stance time L LE; good step length with L LE   Stairs            Wheelchair Mobility    Modified Rankin (Stroke Patients Only)       Balance Overall balance assessment: Needs assistance Sitting-balance support: No upper extremity supported;Feet supported Sitting balance-Leahy Scale: Normal     Standing balance support: Bilateral upper extremity supported;During functional activity (RW use) Standing balance-Leahy Scale: Good                      Cognition Arousal/Alertness: Awake/alert Behavior During Therapy: WFL for tasks assessed/performed Overall Cognitive Status: Within Functional Limits for tasks assessed                      Exercises Total Joint Exercises Ankle Circles/Pumps: AROM;Strengthening;10 reps;Supine;Both Quad Sets: AROM;Strengthening;10 reps;Supine;Both Short Arc Quad: AROM;Strengthening;Left;10 reps;Supine Heel Slides: AAROM;Strengthening;Left;10 reps;Supine Hip ABduction/ADduction: AROM;Strengthening;Left;10 reps;Supine Straight Leg Raises: AROM;Strengthening;Left;10 reps;Supine Goniometric ROM: L knee extension 7 degrees short of neutral semi-supine; L knee flexion 104 degrees AROM in sitting    General Comments General comments (skin integrity, edema, etc.): L knee dressing in place with minimal drainage noted; polar care in place.  Pt agreeable to PT session.     Pertinent Vitals/Pain Pain Assessment: 0-10 Pain Score: 4  Pain Location: L knee Pain Descriptors / Indicators: Sore;Tender Pain Intervention(s): Limited activity within patient's tolerance;Monitored during session;Repositioned;RN gave pain meds during session;Ice applied    Home Living  Prior Function            PT Goals (current goals can now be found  in the care plan section) Acute Rehab PT Goals Patient Stated Goal: to be able to walk without pain PT Goal Formulation: With patient Time For Goal Achievement: 04/18/16 Potential to Achieve Goals: Good Additional Goals Additional Goal #1: L knee ROM 0-90 degrees. Progress towards PT goals: Progressing toward goals    Frequency    BID      PT Plan Current plan remains appropriate    Co-evaluation             End of Session Equipment Utilized During Treatment: Gait belt Activity Tolerance: Patient tolerated treatment well Patient left: in bed;with call bell/phone within reach;with bed alarm set;with family/visitor present;with SCD's reapplied (R heel elevated via towel roll; L heel elevated via bone foam; polar care in place and activated)     Time: 3736-6815 PT Time Calculation (min) (ACUTE ONLY): 43 min  Charges:  $Gait Training: 8-22 mins $Therapeutic Exercise: 23-37 mins                    G CodesLeitha Bleak May 03, 2016, 3:50 PM Leitha Bleak, [place]

## 2016-04-07 NOTE — Progress Notes (Signed)
   Subjective: 3 Days Post-Op Procedure(s) (LRB): TOTAL KNEE ARTHROPLASTY (Left) Patient reports pain as 1 on 0-10 scale.   Patient is well, and has had no acute complaints or problems Denies any CP, SOB, ABD pain. We will continue therapy today.  Plan is to go Home after hospital stay.  Objective: Vital signs in last 24 hours: Temp:  [97.9 F (36.6 C)-98.2 F (36.8 C)] 98 F (36.7 C) (09/22 0717) Pulse Rate:  [59-86] 59 (09/22 0717) Resp:  [18] 18 (09/22 0717) BP: (124-154)/(55-75) 124/63 (09/22 0717) SpO2:  [93 %-100 %] 95 % (09/22 0717)  Intake/Output from previous day: 09/21 0701 - 09/22 0700 In: 1200 [P.O.:1200] Out: -  Intake/Output this shift: No intake/output data recorded.   Recent Labs  04/04/16 1655 04/05/16 0356 04/06/16 0540  HGB 10.6* 9.9* 10.4*    Recent Labs  04/05/16 0356 04/06/16 0540  WBC 17.5* 22.6*  RBC 3.13* 3.24*  HCT 29.9* 31.2*  PLT 245 252    Recent Labs  04/04/16 1655 04/05/16 0356  NA  --  137  K  --  4.3  CL  --  105  CO2  --  26  BUN  --  20  CREATININE 0.76 0.61  GLUCOSE  --  202*  CALCIUM  --  8.6*   No results for input(s): LABPT, INR in the last 72 hours.  EXAM General - Patient is Alert, Appropriate and Oriented Extremity - Neurovascular intact Sensation intact distally Intact pulses distally Dorsiflexion/Plantar flexion intact No cellulitis present Compartment soft Dressing - no drainage,  CDI Motor Function - intact, moving foot and toes well on exam.   Past Medical History:  Diagnosis Date  . Anemia   . Anxiety   . Arthritis    rheumatoid  . Ataxia   . COPD (chronic obstructive pulmonary disease) ([place])   . Depression   . Diverticulosis   . Excessive falling   . GERD (gastroesophageal reflux disease)   . Hyperlipidemia   . Hypertension   . IBS (irritable bowel syndrome)   . Osteoarthritis   . Psoriasis   . Raynaud's disease without gangrene   . Raynaud's disease without gangrene   .  Shortness of breath dyspnea    with exertion    Assessment/Plan:   3 Days Post-Op Procedure(s) (LRB): TOTAL KNEE ARTHROPLASTY (Left) Active Problems:   Other secondary osteoarthritis of one knee  Estimated body mass index is 22.83 kg/m as calculated from the following:   Height as of this encounter: 5' 4"  (1.626 m).   Weight as of this encounter: 60.3 kg (133 lb). Advance diet Up with therapy  Plan on discharge home with HHPT today Follow up with [facility] in 2 weeks  DVT Prophylaxis - Lovenox, Foot Pumps and TED hose Weight-Bearing as tolerated to left leg   T. Rachelle Hora, PA-C [place] 04/07/2016, 7:36 AM

## 2016-04-07 NOTE — Care Management Note (Signed)
Case Management Note  Patient Details  Name: Melissa [name] MRN: [id number] Date of Birth: 1941/03/22  Subjective/Objective:    Discharge home.                 Action/Plan: [facility] notified of discharge  Expected Discharge Date:    04/07/2016              Expected Discharge Plan:  [name]  In-House Referral:     Discharge planning Services     Post Acute Care Choice:  Home Health Choice offered to:  Patient  DME Arranged:    DME Agency:     HH Arranged:  PT [place]:  [facility] (now [facility])  Status of Service:  Completed, signed off  If discussed at [employer] of Stay Meetings, dates discussed:    Additional Comments:  Jolly Mango, RN 04/07/2016, 8:50 AM

## 2016-04-08 DIAGNOSIS — M1990 Unspecified osteoarthritis, unspecified site: Secondary | ICD-10-CM | POA: Diagnosis not present

## 2016-04-08 DIAGNOSIS — M069 Rheumatoid arthritis, unspecified: Secondary | ICD-10-CM | POA: Diagnosis not present

## 2016-04-08 DIAGNOSIS — Z471 Aftercare following joint replacement surgery: Secondary | ICD-10-CM | POA: Diagnosis not present

## 2016-04-08 DIAGNOSIS — I7300 Raynaud's syndrome without gangrene: Secondary | ICD-10-CM | POA: Diagnosis not present

## 2016-04-08 DIAGNOSIS — J449 Chronic obstructive pulmonary disease, unspecified: Secondary | ICD-10-CM | POA: Diagnosis not present

## 2016-04-08 DIAGNOSIS — I10 Essential (primary) hypertension: Secondary | ICD-10-CM | POA: Diagnosis not present

## 2016-04-10 DIAGNOSIS — J449 Chronic obstructive pulmonary disease, unspecified: Secondary | ICD-10-CM | POA: Diagnosis not present

## 2016-04-10 DIAGNOSIS — I10 Essential (primary) hypertension: Secondary | ICD-10-CM | POA: Diagnosis not present

## 2016-04-10 DIAGNOSIS — Z471 Aftercare following joint replacement surgery: Secondary | ICD-10-CM | POA: Diagnosis not present

## 2016-04-10 DIAGNOSIS — M1990 Unspecified osteoarthritis, unspecified site: Secondary | ICD-10-CM | POA: Diagnosis not present

## 2016-04-10 DIAGNOSIS — I7300 Raynaud's syndrome without gangrene: Secondary | ICD-10-CM | POA: Diagnosis not present

## 2016-04-10 DIAGNOSIS — M069 Rheumatoid arthritis, unspecified: Secondary | ICD-10-CM | POA: Diagnosis not present

## 2016-04-12 DIAGNOSIS — I7300 Raynaud's syndrome without gangrene: Secondary | ICD-10-CM | POA: Diagnosis not present

## 2016-04-12 DIAGNOSIS — J449 Chronic obstructive pulmonary disease, unspecified: Secondary | ICD-10-CM | POA: Diagnosis not present

## 2016-04-12 DIAGNOSIS — I10 Essential (primary) hypertension: Secondary | ICD-10-CM | POA: Diagnosis not present

## 2016-04-12 DIAGNOSIS — Z471 Aftercare following joint replacement surgery: Secondary | ICD-10-CM | POA: Diagnosis not present

## 2016-04-12 DIAGNOSIS — M1990 Unspecified osteoarthritis, unspecified site: Secondary | ICD-10-CM | POA: Diagnosis not present

## 2016-04-12 DIAGNOSIS — M069 Rheumatoid arthritis, unspecified: Secondary | ICD-10-CM | POA: Diagnosis not present

## 2016-04-14 DIAGNOSIS — I7300 Raynaud's syndrome without gangrene: Secondary | ICD-10-CM | POA: Diagnosis not present

## 2016-04-14 DIAGNOSIS — M1990 Unspecified osteoarthritis, unspecified site: Secondary | ICD-10-CM | POA: Diagnosis not present

## 2016-04-14 DIAGNOSIS — I10 Essential (primary) hypertension: Secondary | ICD-10-CM | POA: Diagnosis not present

## 2016-04-14 DIAGNOSIS — Z471 Aftercare following joint replacement surgery: Secondary | ICD-10-CM | POA: Diagnosis not present

## 2016-04-14 DIAGNOSIS — J449 Chronic obstructive pulmonary disease, unspecified: Secondary | ICD-10-CM | POA: Diagnosis not present

## 2016-04-14 DIAGNOSIS — M069 Rheumatoid arthritis, unspecified: Secondary | ICD-10-CM | POA: Diagnosis not present

## 2016-04-17 DIAGNOSIS — M069 Rheumatoid arthritis, unspecified: Secondary | ICD-10-CM | POA: Diagnosis not present

## 2016-04-17 DIAGNOSIS — I7300 Raynaud's syndrome without gangrene: Secondary | ICD-10-CM | POA: Diagnosis not present

## 2016-04-17 DIAGNOSIS — I10 Essential (primary) hypertension: Secondary | ICD-10-CM | POA: Diagnosis not present

## 2016-04-17 DIAGNOSIS — J449 Chronic obstructive pulmonary disease, unspecified: Secondary | ICD-10-CM | POA: Diagnosis not present

## 2016-04-17 DIAGNOSIS — Z471 Aftercare following joint replacement surgery: Secondary | ICD-10-CM | POA: Diagnosis not present

## 2016-04-17 DIAGNOSIS — M1990 Unspecified osteoarthritis, unspecified site: Secondary | ICD-10-CM | POA: Diagnosis not present

## 2016-04-19 DIAGNOSIS — I7300 Raynaud's syndrome without gangrene: Secondary | ICD-10-CM | POA: Diagnosis not present

## 2016-04-19 DIAGNOSIS — Z79899 Other long term (current) drug therapy: Secondary | ICD-10-CM | POA: Diagnosis not present

## 2016-04-19 DIAGNOSIS — M150 Primary generalized (osteo)arthritis: Secondary | ICD-10-CM | POA: Diagnosis not present

## 2016-04-19 DIAGNOSIS — Z96652 Presence of left artificial knee joint: Secondary | ICD-10-CM | POA: Diagnosis not present

## 2016-04-24 DIAGNOSIS — Z96652 Presence of left artificial knee joint: Secondary | ICD-10-CM | POA: Diagnosis not present

## 2016-04-26 DIAGNOSIS — Z96652 Presence of left artificial knee joint: Secondary | ICD-10-CM | POA: Diagnosis not present

## 2016-04-27 DIAGNOSIS — M810 Age-related osteoporosis without current pathological fracture: Secondary | ICD-10-CM | POA: Diagnosis not present

## 2016-04-27 DIAGNOSIS — Z96652 Presence of left artificial knee joint: Secondary | ICD-10-CM | POA: Diagnosis not present

## 2016-04-27 DIAGNOSIS — Z79899 Other long term (current) drug therapy: Secondary | ICD-10-CM | POA: Diagnosis not present

## 2016-05-01 DIAGNOSIS — Z96652 Presence of left artificial knee joint: Secondary | ICD-10-CM | POA: Diagnosis not present

## 2016-05-03 DIAGNOSIS — Z96652 Presence of left artificial knee joint: Secondary | ICD-10-CM | POA: Diagnosis not present

## 2016-05-16 DIAGNOSIS — Z96652 Presence of left artificial knee joint: Secondary | ICD-10-CM | POA: Diagnosis not present

## 2016-05-17 DIAGNOSIS — Z96652 Presence of left artificial knee joint: Secondary | ICD-10-CM | POA: Diagnosis not present

## 2016-05-18 DIAGNOSIS — Z96652 Presence of left artificial knee joint: Secondary | ICD-10-CM | POA: Diagnosis not present

## 2016-05-22 DIAGNOSIS — Z96652 Presence of left artificial knee joint: Secondary | ICD-10-CM | POA: Diagnosis not present

## 2016-05-25 DIAGNOSIS — Z96652 Presence of left artificial knee joint: Secondary | ICD-10-CM | POA: Diagnosis not present

## 2016-06-15 DIAGNOSIS — Z0000 Encounter for general adult medical examination without abnormal findings: Secondary | ICD-10-CM | POA: Diagnosis not present

## 2016-06-15 DIAGNOSIS — I10 Essential (primary) hypertension: Secondary | ICD-10-CM | POA: Diagnosis not present

## 2016-06-15 DIAGNOSIS — E7800 Pure hypercholesterolemia, unspecified: Secondary | ICD-10-CM | POA: Diagnosis not present

## 2016-06-15 DIAGNOSIS — J8410 Pulmonary fibrosis, unspecified: Secondary | ICD-10-CM | POA: Diagnosis not present

## 2016-07-17 DIAGNOSIS — I2510 Atherosclerotic heart disease of native coronary artery without angina pectoris: Secondary | ICD-10-CM

## 2016-07-17 HISTORY — DX: Atherosclerotic heart disease of native coronary artery without angina pectoris: I25.10

## 2016-07-19 DIAGNOSIS — M150 Primary generalized (osteo)arthritis: Secondary | ICD-10-CM | POA: Diagnosis not present

## 2016-07-19 DIAGNOSIS — I7300 Raynaud's syndrome without gangrene: Secondary | ICD-10-CM | POA: Diagnosis not present

## 2016-07-19 DIAGNOSIS — Z79899 Other long term (current) drug therapy: Secondary | ICD-10-CM | POA: Diagnosis not present

## 2016-07-26 DIAGNOSIS — I7300 Raynaud's syndrome without gangrene: Secondary | ICD-10-CM | POA: Diagnosis not present

## 2016-07-26 DIAGNOSIS — Z79899 Other long term (current) drug therapy: Secondary | ICD-10-CM | POA: Diagnosis not present

## 2016-07-26 DIAGNOSIS — M150 Primary generalized (osteo)arthritis: Secondary | ICD-10-CM | POA: Diagnosis not present

## 2016-07-26 DIAGNOSIS — M778 Other enthesopathies, not elsewhere classified: Secondary | ICD-10-CM | POA: Diagnosis not present

## 2016-07-26 DIAGNOSIS — M359 Systemic involvement of connective tissue, unspecified: Secondary | ICD-10-CM | POA: Diagnosis not present

## 2016-07-28 ENCOUNTER — Encounter: Payer: Self-pay | Admitting: Occupational Therapy

## 2016-07-28 ENCOUNTER — Ambulatory Visit: Payer: Medicare HMO | Attending: Rheumatology | Admitting: Occupational Therapy

## 2016-07-28 DIAGNOSIS — M25642 Stiffness of left hand, not elsewhere classified: Secondary | ICD-10-CM | POA: Diagnosis not present

## 2016-07-28 DIAGNOSIS — M25641 Stiffness of right hand, not elsewhere classified: Secondary | ICD-10-CM | POA: Diagnosis not present

## 2016-07-28 DIAGNOSIS — M25522 Pain in left elbow: Secondary | ICD-10-CM | POA: Insufficient documentation

## 2016-07-28 DIAGNOSIS — M79641 Pain in right hand: Secondary | ICD-10-CM | POA: Diagnosis not present

## 2016-07-28 DIAGNOSIS — M6281 Muscle weakness (generalized): Secondary | ICD-10-CM | POA: Insufficient documentation

## 2016-07-28 DIAGNOSIS — M79642 Pain in left hand: Secondary | ICD-10-CM | POA: Insufficient documentation

## 2016-07-28 NOTE — Therapy (Signed)
[facility] [address], [place], [address] Phone: [phone]   Fax:  [phone]  Occupational Therapy Evaluation  Patient Details  Name: Melissa [name] MRN: [id number] Date of Birth: 1941-03-24 Referring Provider: Jefm Bryant  Encounter Date: 07/28/2016      OT End of Session - 07/28/16 1007    Visit Number 1   Number of Visits 8   Date for OT Re-Evaluation 08/25/16   OT Start Time 0840   OT Stop Time 0948   OT Time Calculation (min) 68 min   Activity Tolerance Patient tolerated treatment well   Behavior During Therapy [facility] for tasks assessed/performed      Past Medical History:  Diagnosis Date  . Anemia   . Anxiety   . Arthritis    rheumatoid  . Ataxia   . COPD (chronic obstructive pulmonary disease) ([place])   . Depression   . Diverticulosis   . Excessive falling   . GERD (gastroesophageal reflux disease)   . Hyperlipidemia   . Hypertension   . IBS (irritable bowel syndrome)   . Osteoarthritis   . Psoriasis   . Raynaud's disease without gangrene   . Raynaud's disease without gangrene   . Shortness of breath dyspnea    with exertion    Past Surgical History:  Procedure Laterality Date  . ABDOMINAL HYSTERECTOMY    . APPENDECTOMY    . BACK SURGERY     Spinal fusion with rods and screws  . CHOLECYSTECTOMY    . EYE SURGERY Bilateral    Cataract Extraction with IOL  . KNEE ARTHROSCOPY Left    partial lateral meniscectomy, debridement, excision of Baker's cyst  . ROTATOR CUFF REPAIR Right   . TOTAL KNEE ARTHROPLASTY Left 04/04/2016   Procedure: TOTAL KNEE ARTHROPLASTY;  Surgeon: Hessie Knows, MD;  Location: [facility] ORS;  Service: Orthopedics;  Laterality: Left;    There were no vitals filed for this visit.      Subjective Assessment - 07/28/16 0854    Subjective  Both hands hurting more and L elbow since using walker after surgery - and  I started using my hands more about beginning Dec   Patient Stated Goals Want to get my hands better like lifting glasses, vacuuming and putting dishes up , opening jars/ opening packages, - want pain better    Currently in Pain? Yes   Pain Score 3    Pain Location Hand   Pain Orientation Right;Left   Pain Descriptors / Indicators Aching   Pain Type Chronic pain   Pain Onset More than a month ago           [facility] OT Assessment - 07/28/16 0001      Assessment   Diagnosis bilateral hands pain , L lateral epicondyle    Referring Provider kernodle   Onset Date 04/04/16   Prior Therapy PT for her [place] Retired   Leisure  R hand, do own house work , read and play IPAD,  gardening,      Strength   Right Hand Grip (lbs) 23  24 extended arm    Right Hand Lateral Pinch 7 lbs   Right Hand 3 Point Pinch 6 lbs   Left Hand Grip (lbs) 25  extended arm 27   Left Hand Lateral Pinch 9 lbs   Left Hand 3 Point  Pinch 6 lbs     Right Hand AROM   R Index  MCP 0-90 90 Degrees   R Index PIP 0-100 85 Degrees   R Long  MCP 0-90 90 Degrees   R Long PIP 0-100 85 Degrees   R Ring  MCP 0-90 90 Degrees   R Ring PIP 0-100 70 Degrees   R Little  MCP 0-90 90 Degrees   R Little PIP 0-100 70 Degrees     Left Hand AROM   L Index  MCP 0-90 90 Degrees   L Index PIP 0-100 90 Degrees   L Long  MCP 0-90 90 Degrees   L Long PIP 0-100 85 Degrees   L Ring  MCP 0-90 90 Degrees   L Ring PIP 0-100 90 Degrees   L Little  MCP 0-90 90 Degrees   L Little PIP 0-100 90 Degrees       paraffin done to bilateral hands - pain decrease   prior to review of HEP done  Hand out provided  Moist heat to L elbow , cross friction massage , stretches and ice massage   add to HEP                   OT Education - 07/28/16 1006    Education provided Yes   Education Details HEP and findings - modifications    Person(s) Educated Patient   Methods Explanation;Demonstration;Tactile cues    Comprehension Verbalized understanding;Returned demonstration          OT Short Term Goals - 07/28/16 1015      OT SHORT TERM GOAL #1   Title Pain at L elbow and hands  improve to pt able to grasp 2 lbs object with pain less than 3/10    Baseline pain at rest 3/10 and with use increase to 6/10    Time 3   Period Weeks   Status New     OT SHORT TERM GOAL #2   Title Pt to be ind in HEP to  decrease lateral epicondylitis symptoms  L elbow, and increase grip and ROM in bilateral hands    Baseline see flowsheet   Time 3   Period Weeks   Status New           OT Long Term Goals - 07/28/16 1017      OT LONG TERM GOAL #1   Title Bilateral grip in hands increase with at least 5 points to hold glass    Baseline R 23 lbs, L 24 lbs    Time 4   Period Weeks   Status New     OT LONG TERM GOAL #2   Title Pt to verbalize 3 joint protection and AE  to decrease pain in bilateral hands to increase ease using hands in ADL's and IADL's   Time 4   Period Weeks   Status New               Plan - 07/28/16 1008    Clinical Impression Statement Pt present this date with bilateral hands pain and L elbow lateral epicondylitis that increase or develop after being on walker  with her L TKR in Sept - pt show decrease AROM in all digits - mostly in PIP's - pt has arthritic changes in all joints - extention decrease - and thumb CMC - pain mostly in digits , decrease grip and prehension strength - limiting her functional use of bilateral hands in ADL's and  IADlL's - Pt also positive for  L lateral epicondylitis - tenderenss , pain with 3rd and wrist extention resistance -  pt was ed on applying counter force brace correctly  - pt can benefit fom OT services     Rehab Potential Good   OT Frequency 2x / week   OT Duration 4 weeks   OT Treatment/Interventions Self-care/ADL training;Moist Heat;Splinting;Patient/family education;Therapeutic exercises;Iontophoresis;Parrafin;Manual Therapy;Passive range  of motion   Plan assess progress with  HEP    OT Home Exercise Plan see pt instruction   Consulted and Agree with Plan of Care Patient      Patient will benefit from skilled therapeutic intervention in order to improve the following deficits and impairments:  Decreased range of motion, Impaired flexibility, Pain, Impaired UE functional use, Decreased strength, Decreased knowledge of use of DME  Visit Diagnosis: Pain in left hand - Plan: Ot plan of care cert/re-cert  Pain in right hand - Plan: Ot plan of care cert/re-cert  Stiffness of left hand, not elsewhere classified - Plan: Ot plan of care cert/re-cert  Stiffness of right hand, not elsewhere classified - Plan: Ot plan of care cert/re-cert  Pain in left elbow - Plan: Ot plan of care cert/re-cert  Muscle weakness (generalized) - Plan: Ot plan of care cert/re-cert      G-Codes - 52/84/13 1020    Functional Assessment Tool Used ROM , grip and prehension , pain , ADL's function, clinical judgement   Functional Limitation Self care   Self Care Current Status (K4401) At least 40 percent but less than 60 percent impaired, limited or restricted   Self Care Goal Status (U2725) At least 1 percent but less than 20 percent impaired, limited or restricted      Problem List Patient Active Problem List   Diagnosis Date Noted  . Other secondary osteoarthritis of one knee 04/04/2016    Rosalyn Gess OTR/L,CLT 07/28/2016, 10:23 AM  [facility] [address] [place], [address] Phone: [phone]   Fax:  [phone]  Name: AZALEE WEIMER MRN: [id number] Date of Birth: 1941-03-28

## 2016-07-28 NOTE — Patient Instructions (Signed)
Heat to bilateral hands  Tendon glides  But stop when feeling pull  Joint protection and AE hand out provided and reviewed    L elbow moist heat Massage  Extensor stretch with elbow to side, loose fist  5 x hold 5 sec   Ed on using counter force brace  And modifications in using hand to decrease lateral epicondyle symptoms

## 2016-08-01 DIAGNOSIS — H40003 Preglaucoma, unspecified, bilateral: Secondary | ICD-10-CM | POA: Diagnosis not present

## 2016-08-01 DIAGNOSIS — Z79899 Other long term (current) drug therapy: Secondary | ICD-10-CM | POA: Diagnosis not present

## 2016-08-01 DIAGNOSIS — H43823 Vitreomacular adhesion, bilateral: Secondary | ICD-10-CM | POA: Diagnosis not present

## 2016-08-02 ENCOUNTER — Ambulatory Visit: Payer: Medicare HMO | Admitting: Occupational Therapy

## 2016-08-07 ENCOUNTER — Ambulatory Visit: Payer: Medicare HMO | Admitting: Occupational Therapy

## 2016-08-07 DIAGNOSIS — M25642 Stiffness of left hand, not elsewhere classified: Secondary | ICD-10-CM | POA: Diagnosis not present

## 2016-08-07 DIAGNOSIS — M25641 Stiffness of right hand, not elsewhere classified: Secondary | ICD-10-CM | POA: Diagnosis not present

## 2016-08-07 DIAGNOSIS — M6281 Muscle weakness (generalized): Secondary | ICD-10-CM | POA: Diagnosis not present

## 2016-08-07 DIAGNOSIS — M79641 Pain in right hand: Secondary | ICD-10-CM | POA: Diagnosis not present

## 2016-08-07 DIAGNOSIS — M79642 Pain in left hand: Secondary | ICD-10-CM | POA: Diagnosis not present

## 2016-08-07 DIAGNOSIS — M25522 Pain in left elbow: Secondary | ICD-10-CM | POA: Diagnosis not present

## 2016-08-07 NOTE — Therapy (Signed)
[facility] [address], [place], [address] Phone: [phone]   Fax:  [phone]  Occupational Therapy Treatment  Patient Details  Name: Melissa [name] MRN: [id number] Date of Birth: 1941/04/14 Referring Provider: Jefm Bryant  Encounter Date: 08/07/2016      OT End of Session - 08/07/16 1035    Visit Number 2   Number of Visits 8   Date for OT Re-Evaluation 08/25/16   OT Start Time 0928   OT Stop Time 1013   OT Time Calculation (min) 45 min   Activity Tolerance Patient tolerated treatment well   Behavior During Therapy [facility] for tasks assessed/performed      Past Medical History:  Diagnosis Date  . Anemia   . Anxiety   . Arthritis    rheumatoid  . Ataxia   . COPD (chronic obstructive pulmonary disease) ([place])   . Depression   . Diverticulosis   . Excessive falling   . GERD (gastroesophageal reflux disease)   . Hyperlipidemia   . Hypertension   . IBS (irritable bowel syndrome)   . Osteoarthritis   . Psoriasis   . Raynaud's disease without gangrene   . Raynaud's disease without gangrene   . Shortness of breath dyspnea    with exertion    Past Surgical History:  Procedure Laterality Date  . ABDOMINAL HYSTERECTOMY    . APPENDECTOMY    . BACK SURGERY     Spinal fusion with rods and screws  . CHOLECYSTECTOMY    . EYE SURGERY Bilateral    Cataract Extraction with IOL  . KNEE ARTHROSCOPY Left    partial lateral meniscectomy, debridement, excision of Baker's cyst  . ROTATOR CUFF REPAIR Right   . TOTAL KNEE ARTHROPLASTY Left 04/04/2016   Procedure: TOTAL KNEE ARTHROPLASTY;  Surgeon: Hessie Knows, MD;  Location: [facility] ORS;  Service: Orthopedics;  Laterality: Left;    There were no vitals filed for this visit.      Subjective Assessment - 08/07/16 1029    Subjective  My hands are doing better -they are not waking me up at night time and my elbow feels better to - did not feel it last night or  driving over here   Patient Stated Goals Want to get my hands better like lifting glasses, vacuuming and putting dishes up , opening jars/ opening packages, - want pain better    Currently in Pain? Yes   Pain Score 2    Pain Location Hand   Pain Orientation Right;Left   Pain Descriptors / Indicators Aching   Pain Type Chronic pain            [facility] OT Assessment - 08/07/16 0001      Strength   Right Hand Grip (lbs) 30   Right Hand Lateral Pinch 9 lbs   Right Hand 3 Point Pinch 9 lbs   Left Hand Grip (lbs) 32   Left Hand Lateral Pinch 10 lbs   Left Hand 3 Point Pinch 11 lbs     Right Hand AROM   R Index  MCP 0-90 90 Degrees   R Index PIP 0-100 90 Degrees   R Long  MCP 0-90 90 Degrees   R Long PIP 0-100 100 Degrees   R Ring  MCP 0-90 90 Degrees   R Ring PIP 0-100 85 Degrees   R Little  MCP 0-90 90 Degrees   R Little PIP 0-100 75 Degrees     Left  Hand AROM   L Index  MCP 0-90 90 Degrees   L Index PIP 0-100 96 Degrees   L Long  MCP 0-90 90 Degrees   L Long PIP 0-100 96 Degrees   L Ring  MCP 0-90 90 Degrees   L Ring PIP 0-100 96 Degrees   L Little  MCP 0-90 90 Degrees   L Little PIP 0-100 90 Degrees                  OT Treatments/Exercises (OP) - 08/07/16 0001      RUE Paraffin   Number Minutes Paraffin 10 Minutes   RUE Paraffin Location Hand   Comments to decrease pain and stiffness  prior to review of HEP      LUE Paraffin   Number Minutes Paraffin 10 Minutes   LUE Paraffin Location Hand   Comments to decrease pain and increase ROM - prior to review of HEP      Measurements taken for grip and prehension as well as digits AROM  See flow sheet  Heat to L elbow - 10 min  Cross friction massage to lateral epicondyle - stretches for extensors of forearm -  Extended arm with wrist flexion - in neutral and pronation position - 5 reps and hold 5 sec Needed min A   Reviewed with pt again joint protection principles - and AE  pt used moist heat at home  - and implementing joint protection  Reviewed tendon glides - 10 reps  And add  Opposition to all digits  5 reps each   [place] at 50% , 1.0 intensity - 3.3MHZ for 4 min over trigger point at extensor mass at lateral epicondyle - L elbow after stretches   Reviewed with pt wearing and applying counter force splint - she did get new one - with bubble               OT Education - 08/07/16 1035    Education provided Yes   Education Details HEP reviewed and add opposition    Person(s) Educated Patient   Methods Explanation;Demonstration;Tactile cues;Verbal cues   Comprehension Verbal cues required;Returned demonstration;Verbalized understanding          OT Short Term Goals - 08/07/16 1037      OT SHORT TERM GOAL #1   Title Pain at L elbow and hands  improve to pt able to grasp 2 lbs object with pain less than 3/10    Baseline no pain today in elbow or last night    Time 3   Period Weeks   Status On-going     OT SHORT TERM GOAL #2   Title Pt to be ind in HEP to  decrease lateral epicondylitis symptoms  L elbow, and increase grip and ROM in bilateral hands    Status Achieved           OT Long Term Goals - 08/07/16 1038      OT LONG TERM GOAL #1   Title Bilateral grip in hands increase with at least 5 points to hold glass    Baseline R 30 and L 32 lbs    Status Achieved     OT LONG TERM GOAL #2   Title Pt to verbalize 3 joint protection and AE  to decrease pain in bilateral hands to increase ease using hands in ADL's and IADL's   Status Achieved               Plan -  08/07/16 1035    Clinical Impression Statement Pt present this date with great improvement in pain , ROM and grip/prehension strength in bilateral hands and L elbow - pt to cont with HEP and contact me if pain increase in the next 2 wks otherwise will discharge  her    Rehab Potential Good   OT Frequency 1x / week   OT Duration 4 weeks   OT Treatment/Interventions Self-care/ADL training;Moist  Heat;Splinting;Patient/family education;Therapeutic exercises;Iontophoresis;Parrafin;Manual Therapy;Passive range of motion   Plan pt to contact me in the next 2-3 wks if pain increase otherwise will discharge her   OT Home Exercise Plan see pt instruction   Consulted and Agree with Plan of Care Patient      Patient will benefit from skilled therapeutic intervention in order to improve the following deficits and impairments:  Decreased range of motion, Impaired flexibility, Pain, Impaired UE functional use, Decreased strength, Decreased knowledge of use of DME  Visit Diagnosis: Pain in left hand  Pain in right hand  Stiffness of left hand, not elsewhere classified  Stiffness of right hand, not elsewhere classified  Pain in left elbow  Muscle weakness (generalized)    Problem List Patient Active Problem List   Diagnosis Date Noted  . Other secondary osteoarthritis of one knee 04/04/2016    Rosalyn Gess OTR/L,CLT 08/07/2016, 10:39 AM  [facility] [address] [place], [address] Phone: [phone]   Fax:  [phone]  Name: Melissa [name] MRN: [id number] Date of Birth: 23-Oct-1940

## 2016-08-07 NOTE — Patient Instructions (Signed)
Same HEP - but add opposition to all digits

## 2016-09-18 DIAGNOSIS — Z79899 Other long term (current) drug therapy: Secondary | ICD-10-CM | POA: Diagnosis not present

## 2016-09-18 DIAGNOSIS — Z1329 Encounter for screening for other suspected endocrine disorder: Secondary | ICD-10-CM | POA: Diagnosis not present

## 2016-09-18 DIAGNOSIS — R002 Palpitations: Secondary | ICD-10-CM | POA: Diagnosis not present

## 2016-09-18 DIAGNOSIS — F411 Generalized anxiety disorder: Secondary | ICD-10-CM | POA: Diagnosis not present

## 2016-09-18 DIAGNOSIS — J8410 Pulmonary fibrosis, unspecified: Secondary | ICD-10-CM | POA: Diagnosis not present

## 2016-09-18 DIAGNOSIS — I10 Essential (primary) hypertension: Secondary | ICD-10-CM | POA: Diagnosis not present

## 2016-09-18 DIAGNOSIS — E7800 Pure hypercholesterolemia, unspecified: Secondary | ICD-10-CM | POA: Diagnosis not present

## 2016-09-18 DIAGNOSIS — J449 Chronic obstructive pulmonary disease, unspecified: Secondary | ICD-10-CM | POA: Diagnosis not present

## 2016-09-18 DIAGNOSIS — J410 Simple chronic bronchitis: Secondary | ICD-10-CM | POA: Diagnosis not present

## 2016-09-18 DIAGNOSIS — I7300 Raynaud's syndrome without gangrene: Secondary | ICD-10-CM | POA: Diagnosis not present

## 2016-10-19 DIAGNOSIS — M150 Primary generalized (osteo)arthritis: Secondary | ICD-10-CM | POA: Diagnosis not present

## 2016-10-19 DIAGNOSIS — M25512 Pain in left shoulder: Secondary | ICD-10-CM | POA: Diagnosis not present

## 2016-10-19 DIAGNOSIS — I7300 Raynaud's syndrome without gangrene: Secondary | ICD-10-CM | POA: Diagnosis not present

## 2016-10-19 DIAGNOSIS — Z79899 Other long term (current) drug therapy: Secondary | ICD-10-CM | POA: Diagnosis not present

## 2016-10-19 DIAGNOSIS — M359 Systemic involvement of connective tissue, unspecified: Secondary | ICD-10-CM | POA: Diagnosis not present

## 2016-10-24 DIAGNOSIS — I7300 Raynaud's syndrome without gangrene: Secondary | ICD-10-CM | POA: Diagnosis not present

## 2016-10-24 DIAGNOSIS — Z1329 Encounter for screening for other suspected endocrine disorder: Secondary | ICD-10-CM | POA: Diagnosis not present

## 2016-10-24 DIAGNOSIS — E7800 Pure hypercholesterolemia, unspecified: Secondary | ICD-10-CM | POA: Diagnosis not present

## 2016-10-24 DIAGNOSIS — I10 Essential (primary) hypertension: Secondary | ICD-10-CM | POA: Diagnosis not present

## 2016-10-24 DIAGNOSIS — Z79899 Other long term (current) drug therapy: Secondary | ICD-10-CM | POA: Diagnosis not present

## 2016-11-20 ENCOUNTER — Other Ambulatory Visit: Payer: Self-pay | Admitting: Internal Medicine

## 2016-11-20 DIAGNOSIS — Z1231 Encounter for screening mammogram for malignant neoplasm of breast: Secondary | ICD-10-CM

## 2016-12-08 ENCOUNTER — Ambulatory Visit
Admission: RE | Admit: 2016-12-08 | Discharge: 2016-12-08 | Disposition: A | Discharge status: Home / Self Care | Payer: Medicare HMO | Source: Ambulatory Visit | Attending: Internal Medicine | Admitting: Internal Medicine

## 2016-12-08 DIAGNOSIS — Z1231 Encounter for screening mammogram for malignant neoplasm of breast: Secondary | ICD-10-CM | POA: Diagnosis not present

## 2016-12-13 DIAGNOSIS — R002 Palpitations: Secondary | ICD-10-CM | POA: Diagnosis not present

## 2016-12-20 DIAGNOSIS — I10 Essential (primary) hypertension: Secondary | ICD-10-CM | POA: Diagnosis not present

## 2016-12-20 DIAGNOSIS — Z79899 Other long term (current) drug therapy: Secondary | ICD-10-CM | POA: Diagnosis not present

## 2016-12-20 DIAGNOSIS — E7800 Pure hypercholesterolemia, unspecified: Secondary | ICD-10-CM | POA: Diagnosis not present

## 2016-12-20 DIAGNOSIS — J410 Simple chronic bronchitis: Secondary | ICD-10-CM | POA: Diagnosis not present

## 2016-12-20 DIAGNOSIS — D649 Anemia, unspecified: Secondary | ICD-10-CM | POA: Diagnosis not present

## 2017-01-08 DIAGNOSIS — K580 Irritable bowel syndrome with diarrhea: Secondary | ICD-10-CM | POA: Diagnosis not present

## 2017-01-08 DIAGNOSIS — K625 Hemorrhage of anus and rectum: Secondary | ICD-10-CM | POA: Diagnosis not present

## 2017-01-08 DIAGNOSIS — R1032 Left lower quadrant pain: Secondary | ICD-10-CM | POA: Diagnosis not present

## 2017-01-08 DIAGNOSIS — K649 Unspecified hemorrhoids: Secondary | ICD-10-CM | POA: Diagnosis not present

## 2017-01-08 DIAGNOSIS — K5791 Diverticulosis of intestine, part unspecified, without perforation or abscess with bleeding: Secondary | ICD-10-CM | POA: Diagnosis not present

## 2017-01-08 DIAGNOSIS — K5792 Diverticulitis of intestine, part unspecified, without perforation or abscess without bleeding: Secondary | ICD-10-CM | POA: Diagnosis not present

## 2017-01-09 ENCOUNTER — Other Ambulatory Visit
Admission: RE | Admit: 2017-01-09 | Discharge: 2017-01-09 | Disposition: A | Discharge status: Home / Self Care | Payer: Medicare HMO | Source: Ambulatory Visit | Attending: Nurse Practitioner | Admitting: Nurse Practitioner

## 2017-01-09 DIAGNOSIS — K580 Irritable bowel syndrome with diarrhea: Secondary | ICD-10-CM | POA: Insufficient documentation

## 2017-01-09 DIAGNOSIS — K625 Hemorrhage of anus and rectum: Secondary | ICD-10-CM | POA: Insufficient documentation

## 2017-01-09 LAB — GASTROINTESTINAL PANEL BY PCR, STOOL (REPLACES STOOL CULTURE)

## 2017-01-09 LAB — C DIFFICILE QUICK SCREEN W PCR REFLEX
C Diff antigen: NEGATIVE
C Diff interpretation: NOT DETECTED
C Diff toxin: NEGATIVE

## 2017-01-18 ENCOUNTER — Other Ambulatory Visit: Payer: Self-pay | Admitting: Nurse Practitioner

## 2017-01-18 DIAGNOSIS — K625 Hemorrhage of anus and rectum: Secondary | ICD-10-CM

## 2017-01-18 DIAGNOSIS — K580 Irritable bowel syndrome with diarrhea: Secondary | ICD-10-CM | POA: Diagnosis not present

## 2017-01-18 DIAGNOSIS — R109 Unspecified abdominal pain: Secondary | ICD-10-CM

## 2017-01-19 ENCOUNTER — Ambulatory Visit
Admission: RE | Admit: 2017-01-19 | Discharge: 2017-01-19 | Disposition: A | Discharge status: Home / Self Care | Payer: Medicare HMO | Source: Ambulatory Visit | Attending: Nurse Practitioner | Admitting: Nurse Practitioner

## 2017-01-19 DIAGNOSIS — R109 Unspecified abdominal pain: Secondary | ICD-10-CM | POA: Diagnosis not present

## 2017-01-19 DIAGNOSIS — K625 Hemorrhage of anus and rectum: Secondary | ICD-10-CM | POA: Insufficient documentation

## 2017-01-19 DIAGNOSIS — I700 Atherosclerosis of aorta: Secondary | ICD-10-CM | POA: Insufficient documentation

## 2017-01-19 DIAGNOSIS — R197 Diarrhea, unspecified: Secondary | ICD-10-CM | POA: Diagnosis not present

## 2017-01-19 MED ORDER — IOPAMIDOL (ISOVUE-300) INJECTION 61%
100.0000 mL | Freq: Once | INTRAVENOUS | Status: AC | PRN
  Administered 2017-01-19: 100 mL via INTRAVENOUS

## 2017-01-23 DIAGNOSIS — H40003 Preglaucoma, unspecified, bilateral: Secondary | ICD-10-CM | POA: Diagnosis not present

## 2017-01-23 DIAGNOSIS — Z0100 Encounter for examination of eyes and vision without abnormal findings: Secondary | ICD-10-CM | POA: Diagnosis not present

## 2017-01-23 DIAGNOSIS — H524 Presbyopia: Secondary | ICD-10-CM | POA: Diagnosis not present

## 2017-01-25 DIAGNOSIS — N281 Cyst of kidney, acquired: Secondary | ICD-10-CM | POA: Diagnosis not present

## 2017-01-25 DIAGNOSIS — J984 Other disorders of lung: Secondary | ICD-10-CM | POA: Diagnosis not present

## 2017-01-25 DIAGNOSIS — Z79899 Other long term (current) drug therapy: Secondary | ICD-10-CM | POA: Diagnosis not present

## 2017-01-25 DIAGNOSIS — D72829 Elevated white blood cell count, unspecified: Secondary | ICD-10-CM | POA: Diagnosis not present

## 2017-01-25 DIAGNOSIS — I10 Essential (primary) hypertension: Secondary | ICD-10-CM | POA: Diagnosis not present

## 2017-01-29 ENCOUNTER — Ambulatory Visit: Payer: Medicare HMO | Admitting: Anesthesiology

## 2017-01-29 ENCOUNTER — Ambulatory Visit
Admission: RE | Admit: 2017-01-29 | Discharge: 2017-01-29 | Disposition: A | Discharge status: Home / Self Care | Payer: Medicare HMO | Source: Ambulatory Visit | Attending: Unknown Physician Specialty | Admitting: Unknown Physician Specialty

## 2017-01-29 ENCOUNTER — Encounter
Admission: RE | Disposition: A | Discharge status: Home / Self Care | Payer: Self-pay | Source: Ambulatory Visit | Attending: Unknown Physician Specialty

## 2017-01-29 DIAGNOSIS — Z7982 Long term (current) use of aspirin: Secondary | ICD-10-CM | POA: Diagnosis not present

## 2017-01-29 DIAGNOSIS — F329 Major depressive disorder, single episode, unspecified: Secondary | ICD-10-CM | POA: Diagnosis not present

## 2017-01-29 DIAGNOSIS — Z7952 Long term (current) use of systemic steroids: Secondary | ICD-10-CM | POA: Insufficient documentation

## 2017-01-29 DIAGNOSIS — R195 Other fecal abnormalities: Secondary | ICD-10-CM | POA: Diagnosis not present

## 2017-01-29 DIAGNOSIS — F419 Anxiety disorder, unspecified: Secondary | ICD-10-CM | POA: Diagnosis not present

## 2017-01-29 DIAGNOSIS — K648 Other hemorrhoids: Secondary | ICD-10-CM | POA: Diagnosis not present

## 2017-01-29 DIAGNOSIS — Z79899 Other long term (current) drug therapy: Secondary | ICD-10-CM | POA: Diagnosis not present

## 2017-01-29 DIAGNOSIS — K219 Gastro-esophageal reflux disease without esophagitis: Secondary | ICD-10-CM | POA: Diagnosis not present

## 2017-01-29 DIAGNOSIS — E785 Hyperlipidemia, unspecified: Secondary | ICD-10-CM | POA: Insufficient documentation

## 2017-01-29 DIAGNOSIS — I7300 Raynaud's syndrome without gangrene: Secondary | ICD-10-CM | POA: Diagnosis not present

## 2017-01-29 DIAGNOSIS — K589 Irritable bowel syndrome without diarrhea: Secondary | ICD-10-CM | POA: Insufficient documentation

## 2017-01-29 DIAGNOSIS — J449 Chronic obstructive pulmonary disease, unspecified: Secondary | ICD-10-CM | POA: Diagnosis not present

## 2017-01-29 DIAGNOSIS — Z9049 Acquired absence of other specified parts of digestive tract: Secondary | ICD-10-CM | POA: Diagnosis not present

## 2017-01-29 DIAGNOSIS — Z09 Encounter for follow-up examination after completed treatment for conditions other than malignant neoplasm: Secondary | ICD-10-CM | POA: Diagnosis not present

## 2017-01-29 DIAGNOSIS — I10 Essential (primary) hypertension: Secondary | ICD-10-CM | POA: Insufficient documentation

## 2017-01-29 DIAGNOSIS — K640 First degree hemorrhoids: Secondary | ICD-10-CM | POA: Insufficient documentation

## 2017-01-29 DIAGNOSIS — R197 Diarrhea, unspecified: Secondary | ICD-10-CM | POA: Diagnosis not present

## 2017-01-29 DIAGNOSIS — I739 Peripheral vascular disease, unspecified: Secondary | ICD-10-CM | POA: Diagnosis not present

## 2017-01-29 DIAGNOSIS — D649 Anemia, unspecified: Secondary | ICD-10-CM | POA: Diagnosis not present

## 2017-01-29 DIAGNOSIS — K31819 Angiodysplasia of stomach and duodenum without bleeding: Secondary | ICD-10-CM | POA: Diagnosis not present

## 2017-01-29 DIAGNOSIS — K625 Hemorrhage of anus and rectum: Secondary | ICD-10-CM | POA: Diagnosis not present

## 2017-01-29 DIAGNOSIS — Z96652 Presence of left artificial knee joint: Secondary | ICD-10-CM | POA: Insufficient documentation

## 2017-01-29 HISTORY — PX: COLONOSCOPY WITH PROPOFOL: SHX5780

## 2017-01-29 HISTORY — PX: ESOPHAGOGASTRODUODENOSCOPY (EGD) WITH PROPOFOL: SHX5813

## 2017-01-29 SURGERY — COLONOSCOPY WITH PROPOFOL
Anesthesia: General

## 2017-01-29 MED ORDER — PIPERACILLIN-TAZOBACTAM 3.375 G IVPB 30 MIN
3.3750 g | Freq: Once | INTRAVENOUS | Status: AC
  Administered 2017-01-29: 3.375 g via INTRAVENOUS
  Filled 2017-01-29: qty 50

## 2017-01-29 MED ORDER — PROPOFOL 10 MG/ML IV BOLUS
INTRAVENOUS | Status: AC
  Filled 2017-01-29: qty 20

## 2017-01-29 MED ORDER — PROPOFOL 500 MG/50ML IV EMUL
INTRAVENOUS | Status: DC | PRN
  Administered 2017-01-29: 160 ug/kg/min via INTRAVENOUS

## 2017-01-29 MED ORDER — GLYCOPYRROLATE 0.2 MG/ML IJ SOLN
INTRAMUSCULAR | Status: DC | PRN
  Administered 2017-01-29: 0.2 mg via INTRAVENOUS

## 2017-01-29 MED ORDER — SODIUM CHLORIDE 0.9 % IV SOLN: INTRAVENOUS | Status: DC

## 2017-01-29 MED ORDER — SODIUM CHLORIDE 0.9 % IV SOLN
INTRAVENOUS | Status: DC
  Administered 2017-01-29: 1000 mL via INTRAVENOUS

## 2017-01-29 MED ORDER — LIDOCAINE 2% (20 MG/ML) 5 ML SYRINGE
INTRAMUSCULAR | Status: DC | PRN
  Administered 2017-01-29: 60 mg via INTRAVENOUS

## 2017-01-29 MED ORDER — PROPOFOL 10 MG/ML IV BOLUS
INTRAVENOUS | Status: DC | PRN
  Administered 2017-01-29: 60 mg via INTRAVENOUS
  Administered 2017-01-29: 40 mg via INTRAVENOUS

## 2017-01-29 MED ORDER — PHENYLEPHRINE HCL 10 MG/ML IJ SOLN
INTRAMUSCULAR | Status: DC | PRN
  Administered 2017-01-29: 100 ug via INTRAVENOUS

## 2017-01-29 MED ORDER — PROPOFOL 500 MG/50ML IV EMUL
INTRAVENOUS | Status: AC
  Filled 2017-01-29: qty 50

## 2017-01-29 NOTE — Anesthesia Post-op Follow-up Note (Cosign Needed)
Anesthesia QCDR form completed.        

## 2017-01-29 NOTE — Op Note (Signed)
[facility] Gastroenterology Patient Name: Melissa [name] Procedure Date: 01/29/2017 1:32 PM MRN: [id number] Account #: [id number] Date of Birth: 13-Jul-1941 Admit Type: Outpatient Age: 76 Room: [facility] ENDO ROOM 3 Gender: Female Note Status: Finalized Procedure:            Upper GI endoscopy Indications:          Heme positive stool Providers:            Manya Silvas, MD Referring MD:         Leonie Douglas. Doy Hutching, MD (Referring MD) Medicines:            Propofol per Anesthesia Complications:        No immediate complications. Procedure:            Pre-Anesthesia Assessment:                       - After reviewing the risks and benefits, the patient                        was deemed in satisfactory condition to undergo the                        procedure.                       After obtaining informed consent, the endoscope was                        passed under direct vision. Throughout the procedure,                        the patient's blood pressure, pulse, and oxygen                        saturations were monitored continuously. The Endoscope                        was introduced through the mouth, and advanced to the                        second part of duodenum. The upper GI endoscopy was                        accomplished without difficulty. The patient tolerated                        the procedure well. Findings:      The examined esophagus was normal.      Localized mildly erythematous mucosa without bleeding was found on the       greater curvature of the stomach. Possible AVM but not likely. No blood       seen in stomach or duodenum or esophagus.      The examined duodenum was normal.      The exam was otherwise without abnormality. Impression:           - Normal esophagus.                       - Erythematous mucosa in the greater curvature.                       -  Normal examined duodenum.                       - The examination was otherwise  normal.                       - No specimens collected. Recommendation:       - Perform a colonoscopy today. Manya Silvas, MD 01/29/2017 1:43:48 PM This report has been signed electronically. Number of Addenda: 0 Note Initiated On: 01/29/2017 1:32 PM      [facility]

## 2017-01-29 NOTE — Anesthesia Preprocedure Evaluation (Signed)
Anesthesia Evaluation  Patient identified by MRN, date of birth, ID band Patient awake    Reviewed: NPO status   Airway Mallampati: III       Dental  (+) Upper Dentures, Lower Dentures   Pulmonary COPD,     + decreased breath sounds      Cardiovascular Exercise Tolerance: Good hypertension, Pt. on medications + Peripheral Vascular Disease   Rhythm:Regular     Neuro/Psych Anxiety Depression negative neurological ROS     GI/Hepatic Neg liver ROS, GERD  Medicated,  Endo/Other  negative endocrine ROS  Renal/GU negative Renal ROS     Musculoskeletal   Abdominal Normal abdominal exam  (+)   Peds negative pediatric ROS (+)  Hematology  (+) anemia ,   Anesthesia Other Findings   Reproductive/Obstetrics                             Anesthesia Physical Anesthesia Plan  ASA: II  Anesthesia Plan: General   Post-op Pain Management:    Induction: Intravenous  PONV Risk Score and Plan: 0  Airway Management Planned: Natural Airway and Nasal Cannula  Additional Equipment:   Intra-op Plan:   Post-operative Plan:   Informed Consent: I have reviewed the patients History and Physical, chart, labs and discussed the procedure including the risks, benefits and alternatives for the proposed anesthesia with the patient or authorized representative who has indicated his/her understanding and acceptance.     Plan Discussed with: CRNA  Anesthesia Plan Comments:         Anesthesia Quick Evaluation

## 2017-01-29 NOTE — Transfer of Care (Signed)
Immediate Anesthesia Transfer of Care Note  Patient: Melissa [name]  Procedure(s) Performed: Procedure(s): COLONOSCOPY WITH PROPOFOL (N/A) ESOPHAGOGASTRODUODENOSCOPY (EGD) WITH PROPOFOL (N/A)  Patient Location: Endoscopy Unit  Anesthesia Type:General  Level of Consciousness: awake, alert  and oriented  Airway & Oxygen Therapy: Patient connected to nasal cannula oxygen  Post-op Assessment: Post -op Vital signs reviewed and stable  Post vital signs: stable  Last Vitals:  Vitals:   01/29/17 1407 01/29/17 1411  BP:  (!) 105/56  Pulse:  71  Resp:  14  Temp: 36.9 C     Last Pain:  Vitals:   01/29/17 1407  TempSrc: Tympanic         Complications: No apparent anesthesia complications

## 2017-01-29 NOTE — H&P (Signed)
Primary Care Physician:  [name], MD Primary Gastroenterologist:  Dr. Vira Agar  Pre-Procedure History & Physical: HPI:  Melissa [name] is a 76 y.o. female is here for an endoscopy and colonoscopy.   Past Medical History:  Diagnosis Date  . Anemia   . Anxiety   . Arthritis    rheumatoid  . Ataxia   . COPD (chronic obstructive pulmonary disease) ([place])   . Depression   . Diverticulosis   . Excessive falling   . GERD (gastroesophageal reflux disease)   . Hyperlipidemia   . Hypertension   . IBS (irritable bowel syndrome)   . Osteoarthritis   . Psoriasis   . Raynaud's disease without gangrene   . Raynaud's disease without gangrene   . Shortness of breath dyspnea    with exertion    Past Surgical History:  Procedure Laterality Date  . ABDOMINAL HYSTERECTOMY    . APPENDECTOMY    . BACK SURGERY     Spinal fusion with rods and screws  . CHOLECYSTECTOMY    . EYE SURGERY Bilateral    Cataract Extraction with IOL  . KNEE ARTHROSCOPY Left    partial lateral meniscectomy, debridement, excision of Baker's cyst  . ROTATOR CUFF REPAIR Right   . TOTAL KNEE ARTHROPLASTY Left 04/04/2016   Procedure: TOTAL KNEE ARTHROPLASTY;  Surgeon: Hessie Knows, MD;  Location: [facility] ORS;  Service: Orthopedics;  Laterality: Left;    Prior to Admission medications   Medication Sig Start Date End Date Taking? Authorizing Provider  aspirin EC 325 MG tablet Take 325 mg by mouth daily.   Yes [provider]  Calcium Carbonate-Vitamin D (CALCIUM 600+D) 600-400 MG-UNIT tablet Take 1 tablet by mouth 2 (two) times daily.   Yes [provider]  diltiazem (CARDIZEM CD) 240 MG 24 hr capsule Take 240 mg by mouth daily.   Yes [provider]  diphenoxylate-atropine (LOMOTIL) 2.5-0.025 MG tablet Take by mouth 4 (four) times daily as needed for diarrhea or loose stools.   Yes [provider]  ferrous sulfate 325 (65 FE) MG tablet Take 325 mg by mouth daily with breakfast.    Yes [provider]  gemfibrozil (LOPID) 600 MG tablet Take 600 mg by mouth 2 (two) times daily before a meal.   Yes [provider]  hydroxychloroquine (PLAQUENIL) 200 MG tablet Take 200 mg by mouth 2 (two) times daily.    Yes [provider]  leflunomide (ARAVA) 20 MG tablet Take 20 mg by mouth daily.    Yes [provider]  LORazepam (ATIVAN) 1 MG tablet Take 1 mg by mouth every 8 (eight) hours as needed.    Yes [provider]  losartan (COZAAR) 100 MG tablet Take 100 mg by mouth daily.   Yes [provider]  Multiple Vitamin (MULTIVITAMIN) tablet Take 1 tablet by mouth daily.   Yes [provider]  Multiple Vitamins-Minerals (PRESERVISION AREDS) CAPS Take 1 capsule by mouth 2 (two) times daily.   Yes [provider]  omeprazole (PRILOSEC) 20 MG capsule Take 20 mg by mouth 2 (two) times daily before a meal.    Yes [provider]  pravastatin (PRAVACHOL) 80 MG tablet Take 80 mg by mouth at bedtime.    Yes [provider]  predniSONE (DELTASONE) 5 MG tablet Take 5 mg by mouth daily.    Yes [provider]  sertraline (ZOLOFT) 50 MG tablet Take 50 mg by mouth at bedtime.    Yes [provider]  traMADol (ULTRAM) 50 MG tablet Take by mouth every 12 (twelve) hours as needed.   Yes [provider]  traMADol (ULTRAM) 50 MG tablet Take 2 tablets (100 mg total) by mouth 2 (two) times daily. 04/06/16  Yes Duanne Guess, PA-C  valsartan (DIOVAN) 80 MG tablet Take 80 mg by mouth daily.   Yes [provider]  oxyCODONE (OXY IR/ROXICODONE) 5 MG immediate release tablet Take 1-2 tablets (5-10 mg total) by mouth every 3 (three) hours as needed for breakthrough pain. Patient not taking: Reported on 07/28/2016 04/06/16   Duanne Guess, PA-C    Allergies as of 01/11/2017 - Review Complete 04/04/2016  Allergen Reaction Noted  . Lodine [etodolac] Hives and Itching 02/08/2012  .  Methotrexate derivatives Other (See Comments) 02/08/2012  . Levaquin [levofloxacin] Itching, Rash, and Other (See Comments) 03/22/2016  . Sulfa antibiotics Itching and Rash 03/22/2016    Family History  Problem Relation Age of Onset  . Breast cancer Neg Hx     Social History   Social History  . Marital status: Married    Spouse name: N/A  . Number of children: N/A  . Years of education: N/A   Occupational History  . Not on file.   Social History Main Topics  . Smoking status: Never Smoker  . Smokeless tobacco: Never Used  . Alcohol use No  . Drug use: No  . Sexual activity: Not on file   Other Topics Concern  . Not on file   Social History Narrative  . No narrative on file    Review of Systems: See HPI, otherwise negative ROS  Physical Exam: BP (!) 153/58   Pulse 80   Temp 97.9 F (36.6 C) (Tympanic)   Resp 16   Ht 5' 4"  (1.626 m)   Wt 57.6 kg (127 lb)   SpO2 95%   BMI 21.80 kg/m  General:   Alert,  pleasant and cooperative in NAD Head:  Normocephalic and atraumatic. Neck:  Supple; no masses or thyromegaly. Lungs:  Clear throughout to auscultation.    Heart:  Regular rate and rhythm. Abdomen:  Soft, nontender and nondistended. Normal bowel sounds, without guarding, and without rebound.   Neurologic:  Alert and  oriented x4;  grossly normal neurologically.  Impression/Plan: Melissa [name] is here for an endoscopy and colonoscopy to be performed for Heme positive stool and follow up of gastric AVM.  Risks, benefits, limitations, and alternatives regarding  endoscopy and colonoscopy have been reviewed with the patient.  Questions have been answered.  All parties agreeable.   Gaylyn Cheers, MD  01/29/2017, 1:27 PM

## 2017-01-29 NOTE — Anesthesia Postprocedure Evaluation (Signed)
Anesthesia Post Note  Patient: Melissa [name]  Procedure(s) Performed: Procedure(s) (LRB): COLONOSCOPY WITH PROPOFOL (N/A) ESOPHAGOGASTRODUODENOSCOPY (EGD) WITH PROPOFOL (N/A)  Patient location during evaluation: PACU Anesthesia Type: General Level of consciousness: awake Pain management: pain level controlled Vital Signs Assessment: post-procedure vital signs reviewed and stable Respiratory status: nonlabored ventilation Cardiovascular status: stable Anesthetic complications: no     Last Vitals:  Vitals:   01/29/17 1407 01/29/17 1411  BP:  (!) 105/56  Pulse:  71  Resp:  14  Temp: 36.9 C     Last Pain:  Vitals:   01/29/17 1407  TempSrc: Tympanic                 VAN [name],Melissa Gillaspie

## 2017-01-29 NOTE — Op Note (Signed)
[facility] Gastroenterology Patient Name: Melissa [name] Procedure Date: 01/29/2017 1:32 PM MRN: [id number] Account #: [id number] Date of Birth: 03-Mar-1941 Admit Type: Outpatient Age: 76 Room: [facility] ENDO ROOM 3 Gender: Female Note Status: Finalized Procedure:            Colonoscopy Indications:          Heme positive stool Providers:            Manya Silvas, MD Referring MD:         Leonie Douglas. Doy Hutching, MD (Referring MD) Medicines:            Propofol per Anesthesia Complications:        No immediate complications. Procedure:            Pre-Anesthesia Assessment:                       - After reviewing the risks and benefits, the patient                        was deemed in satisfactory condition to undergo the                        procedure.                       After obtaining informed consent, the colonoscope was                        passed under direct vision. Throughout the procedure,                        the patient's blood pressure, pulse, and oxygen                        saturations were monitored continuously. The                        Colonoscope was introduced through the anus and                        advanced to the the cecum, identified by appendiceal                        orifice and ileocecal valve. The colonoscopy was                        performed without difficulty. The patient tolerated the                        procedure well. The quality of the bowel preparation                        was excellent. Findings:      Internal hemorrhoids were found during endoscopy. The hemorrhoids were       small and Grade I (internal hemorrhoids that do not prolapse).      The exam was otherwise without abnormality. Impression:           - Internal hemorrhoids.                       - The examination was otherwise normal.                       -  No specimens collected. Recommendation:       - The findings and recommendations were discussed  with                        the patient's family. Consider following hemoglobin                        level and a possible capsule endoscopy. Manya Silvas, MD 01/29/2017 2:03:21 PM This report has been signed electronically. Number of Addenda: 0 Note Initiated On: 01/29/2017 1:32 PM Scope Withdrawal Time: 0 hours 5 minutes 50 seconds  Total Procedure Duration: 0 hours 15 minutes 20 seconds       [facility]

## 2017-01-30 ENCOUNTER — Encounter: Payer: Self-pay | Admitting: Unknown Physician Specialty

## 2017-01-31 DIAGNOSIS — G5601 Carpal tunnel syndrome, right upper limb: Secondary | ICD-10-CM | POA: Diagnosis not present

## 2017-01-31 DIAGNOSIS — M359 Systemic involvement of connective tissue, unspecified: Secondary | ICD-10-CM | POA: Diagnosis not present

## 2017-01-31 DIAGNOSIS — M150 Primary generalized (osteo)arthritis: Secondary | ICD-10-CM | POA: Diagnosis not present

## 2017-02-14 DIAGNOSIS — M810 Age-related osteoporosis without current pathological fracture: Secondary | ICD-10-CM | POA: Diagnosis not present

## 2017-02-21 DIAGNOSIS — R109 Unspecified abdominal pain: Secondary | ICD-10-CM | POA: Diagnosis not present

## 2017-02-21 DIAGNOSIS — D509 Iron deficiency anemia, unspecified: Secondary | ICD-10-CM | POA: Diagnosis not present

## 2017-02-21 DIAGNOSIS — K625 Hemorrhage of anus and rectum: Secondary | ICD-10-CM | POA: Diagnosis not present

## 2017-02-28 ENCOUNTER — Ambulatory Visit
Admission: RE | Admit: 2017-02-28 | Payer: Medicare HMO | Source: Ambulatory Visit | Admitting: Unknown Physician Specialty

## 2017-02-28 ENCOUNTER — Encounter: Admission: RE | Payer: Self-pay | Source: Ambulatory Visit

## 2017-02-28 SURGERY — COLONOSCOPY WITH PROPOFOL
Anesthesia: General

## 2017-03-07 DIAGNOSIS — R8290 Unspecified abnormal findings in urine: Secondary | ICD-10-CM | POA: Diagnosis not present

## 2017-03-20 DIAGNOSIS — M810 Age-related osteoporosis without current pathological fracture: Secondary | ICD-10-CM | POA: Diagnosis not present

## 2017-04-10 DIAGNOSIS — M542 Cervicalgia: Secondary | ICD-10-CM | POA: Diagnosis not present

## 2017-04-10 DIAGNOSIS — H6123 Impacted cerumen, bilateral: Secondary | ICD-10-CM | POA: Diagnosis not present

## 2017-04-10 DIAGNOSIS — H9202 Otalgia, left ear: Secondary | ICD-10-CM | POA: Diagnosis not present

## 2017-04-13 ENCOUNTER — Emergency Department
Admission: EM | Admit: 2017-04-13 | Discharge: 2017-04-13 | Disposition: A | Discharge status: Home / Self Care | Payer: Medicare HMO | Attending: Emergency Medicine | Admitting: Emergency Medicine

## 2017-04-13 DIAGNOSIS — J449 Chronic obstructive pulmonary disease, unspecified: Secondary | ICD-10-CM | POA: Insufficient documentation

## 2017-04-13 DIAGNOSIS — N390 Urinary tract infection, site not specified: Secondary | ICD-10-CM | POA: Diagnosis not present

## 2017-04-13 DIAGNOSIS — Z79899 Other long term (current) drug therapy: Secondary | ICD-10-CM | POA: Diagnosis not present

## 2017-04-13 DIAGNOSIS — I10 Essential (primary) hypertension: Secondary | ICD-10-CM | POA: Diagnosis not present

## 2017-04-13 DIAGNOSIS — Z7982 Long term (current) use of aspirin: Secondary | ICD-10-CM | POA: Diagnosis not present

## 2017-04-13 DIAGNOSIS — R1110 Vomiting, unspecified: Secondary | ICD-10-CM | POA: Diagnosis present

## 2017-04-13 DIAGNOSIS — R197 Diarrhea, unspecified: Secondary | ICD-10-CM | POA: Insufficient documentation

## 2017-04-13 LAB — GASTROINTESTINAL PANEL BY PCR, STOOL (REPLACES STOOL CULTURE)
Adenovirus F40/41: NOT DETECTED
Astrovirus: NOT DETECTED
CRYPTOSPORIDIUM: NOT DETECTED
CYCLOSPORA CAYETANENSIS: NOT DETECTED
Campylobacter species: NOT DETECTED
ENTAMOEBA HISTOLYTICA: NOT DETECTED
ENTEROAGGREGATIVE E COLI (EAEC): NOT DETECTED
Enteropathogenic E coli (EPEC): DETECTED — AB
Enterotoxigenic E coli (ETEC): NOT DETECTED
GIARDIA LAMBLIA: NOT DETECTED
Norovirus GI/GII: NOT DETECTED
Plesimonas shigelloides: NOT DETECTED
Rotavirus A: NOT DETECTED
SALMONELLA SPECIES: NOT DETECTED
SAPOVIRUS (I, II, IV, AND V): NOT DETECTED
SHIGA LIKE TOXIN PRODUCING E COLI (STEC): NOT DETECTED
SHIGELLA/ENTEROINVASIVE E COLI (EIEC): NOT DETECTED
VIBRIO CHOLERAE: NOT DETECTED
Vibrio species: NOT DETECTED
YERSINIA ENTEROCOLITICA: NOT DETECTED

## 2017-04-13 LAB — URINALYSIS, COMPLETE (UACMP) WITH MICROSCOPIC
Bilirubin Urine: NEGATIVE
GLUCOSE, UA: NEGATIVE mg/dL
Ketones, ur: 20 mg/dL — AB
NITRITE: NEGATIVE
PH: 5 (ref 5.0–8.0)
Protein, ur: 100 mg/dL — AB
SPECIFIC GRAVITY, URINE: 1.017 (ref 1.005–1.030)

## 2017-04-13 LAB — CBC
HEMATOCRIT: 33.5 % — AB (ref 35.0–47.0)
HEMOGLOBIN: 11.5 g/dL — AB (ref 12.0–16.0)
MCH: 31.8 pg (ref 26.0–34.0)
MCHC: 34.2 g/dL (ref 32.0–36.0)
MCV: 93.2 fL (ref 80.0–100.0)
Platelets: 135 10*3/uL — ABNORMAL LOW (ref 150–440)
RBC: 3.6 MIL/uL — ABNORMAL LOW (ref 3.80–5.20)
RDW: 14.4 % (ref 11.5–14.5)
WBC: 10.3 10*3/uL (ref 3.6–11.0)

## 2017-04-13 LAB — COMPREHENSIVE METABOLIC PANEL
ALBUMIN: 3.6 g/dL (ref 3.5–5.0)
ALT: 26 U/L (ref 14–54)
ANION GAP: 15 (ref 5–15)
AST: 29 U/L (ref 15–41)
Alkaline Phosphatase: 52 U/L (ref 38–126)
BUN: 16 mg/dL (ref 6–20)
CALCIUM: 8.5 mg/dL — AB (ref 8.9–10.3)
CO2: 17 mmol/L — ABNORMAL LOW (ref 22–32)
Chloride: 105 mmol/L (ref 101–111)
Creatinine, Ser: 0.78 mg/dL (ref 0.44–1.00)
GFR calc Af Amer: >60 mL/min (ref >60)
GFR calc non Af Amer: >60 mL/min (ref >60)
GLUCOSE: 89 mg/dL (ref 65–99)
POTASSIUM: 3.1 mmol/L — AB (ref 3.5–5.1)
Sodium: 137 mmol/L (ref 135–145)
TOTAL PROTEIN: 7 g/dL (ref 6.5–8.1)
Total Bilirubin: 1.1 mg/dL (ref 0.3–1.2)

## 2017-04-13 LAB — LIPASE, BLOOD: LIPASE: 20 U/L (ref 11–51)

## 2017-04-13 MED ORDER — LOPERAMIDE HCL 2 MG PO CAPS
4.0000 mg | ORAL_CAPSULE | Freq: Once | ORAL | Status: AC
  Administered 2017-04-13: 4 mg via ORAL
  Filled 2017-04-13: qty 2

## 2017-04-13 MED ORDER — CEPHALEXIN 500 MG PO CAPS: 500.0000 mg | ORAL_CAPSULE | Freq: Two times a day (BID) | ORAL | 0 refills | Status: DC

## 2017-04-13 MED ORDER — SODIUM CHLORIDE 0.9 % IV BOLUS (SEPSIS)
1000.0000 mL | Freq: Once | INTRAVENOUS | Status: AC
  Administered 2017-04-13: 1000 mL via INTRAVENOUS

## 2017-04-13 MED ORDER — POTASSIUM CHLORIDE CRYS ER 20 MEQ PO TBCR
40.0000 meq | EXTENDED_RELEASE_TABLET | Freq: Once | ORAL | Status: AC
  Administered 2017-04-13: 40 meq via ORAL
  Filled 2017-04-13: qty 2

## 2017-04-13 MED ORDER — CEFTRIAXONE SODIUM IN DEXTROSE 20 MG/ML IV SOLN
1.0000 g | Freq: Once | INTRAVENOUS | Status: AC
  Administered 2017-04-13: 1 g via INTRAVENOUS
  Filled 2017-04-13: qty 50

## 2017-04-13 MED ORDER — ONDANSETRON HCL 4 MG/2ML IJ SOLN
4.0000 mg | Freq: Once | INTRAMUSCULAR | Status: DC
Start: 2017-04-13 — End: 2017-04-13

## 2017-04-13 NOTE — Discharge Instructions (Signed)
You evaluated for diarrhea and as we discussed, I suspect this may be due to a virus, however go ahead and start your medication for ulcerative colitis as well.  You found her urinary tract infection and were given 24-hour IV antibiotic called Rocephin. Start your Keflex tomorrow.  Return to the Emergency Room immediately for any worsening symptoms including concern for dehydration, new or worsening abdominal pain, fever, black or bloody stools, dizziness or passing out, or any other symptoms concerning to you.

## 2017-04-13 NOTE — ED Notes (Signed)
Patient c/o poor PO intake.

## 2017-04-13 NOTE — ED Triage Notes (Signed)
Pt c/o lower abdominal pain X 3 days ago, denies at this time. Pt states that she has been experiencing NVD X 2 days. Pt states she cannot control her diarrhea. Pt alert and oriented X4, active, cooperative, pt in NAD. RR even and unlabored, color WNL.  Denies CP or SOB. Denies any pain at this time.

## 2017-04-13 NOTE — ED Provider Notes (Signed)
[facility] Emergency Department Provider Note ____________________________________________   I have reviewed the triage vital signs and the triage nursing note.  HISTORY  Chief Complaint Abdominal Pain; Emesis; and Diarrhea   Historian Patient and daughter  HPI Melissa [name] is a 76 y.o. female lives at home with her husband, started with vomiting on Monday and Tuesday especially at night, then followed by diarrhea for 2-3 days now. Watery and dark at first, but now just watery. No bloody stool. No fever but she did have some chills. Patient follows with GI for ulcerative colitis and was told to start budesonide today. No abdominal pain.  Symptoms are moderate. One BM here in the emergency room. No more nausea. She has been trying loperamide at home.    Past Medical History:  Diagnosis Date  . Anemia   . Anxiety   . Arthritis    rheumatoid  . Ataxia   . COPD (chronic obstructive pulmonary disease) ([place])   . Depression   . Diverticulosis   . Excessive falling   . GERD (gastroesophageal reflux disease)   . Hyperlipidemia   . Hypertension   . IBS (irritable bowel syndrome)   . Osteoarthritis   . Psoriasis   . Raynaud's disease without gangrene   . Raynaud's disease without gangrene   . Shortness of breath dyspnea    with exertion    Patient Active Problem List   Diagnosis Date Noted  . Other secondary osteoarthritis of one knee 04/04/2016    Past Surgical History:  Procedure Laterality Date  . ABDOMINAL HYSTERECTOMY    . APPENDECTOMY    . BACK SURGERY     Spinal fusion with rods and screws  . CHOLECYSTECTOMY    . COLONOSCOPY WITH PROPOFOL N/A 01/29/2017   Procedure: COLONOSCOPY WITH PROPOFOL;  Surgeon: Manya Silvas, MD;  Location: [facility] ENDOSCOPY;  Service: Endoscopy;  Laterality: N/A;  . ESOPHAGOGASTRODUODENOSCOPY (EGD) WITH PROPOFOL N/A 01/29/2017   Procedure: ESOPHAGOGASTRODUODENOSCOPY (EGD) WITH PROPOFOL;  Surgeon: Manya Silvas, MD;  Location: [facility] ENDOSCOPY;  Service: Endoscopy;  Laterality: N/A;  . EYE SURGERY Bilateral    Cataract Extraction with IOL  . KNEE ARTHROSCOPY Left    partial lateral meniscectomy, debridement, excision of Baker's cyst  . ROTATOR CUFF REPAIR Right   . TOTAL KNEE ARTHROPLASTY Left 04/04/2016   Procedure: TOTAL KNEE ARTHROPLASTY;  Surgeon: Hessie Knows, MD;  Location: [facility] ORS;  Service: Orthopedics;  Laterality: Left;    Prior to Admission medications   Medication Sig Start Date End Date Taking? Authorizing Provider  aspirin EC 325 MG tablet Take 325 mg by mouth daily.   Yes [provider]  budesonide (ENTOCORT EC) 3 MG 24 hr capsule Take 6 mg by mouth daily.   Yes [provider]  Calcium Carbonate-Vitamin D (CALCIUM 600+D) 600-400 MG-UNIT tablet Take 1 tablet by mouth 2 (two) times daily.   Yes [provider]  diltiazem (CARDIZEM CD) 240 MG 24 hr capsule Take 240 mg by mouth daily.   Yes [provider]  ferrous sulfate 325 (65 FE) MG tablet Take 325 mg by mouth daily with breakfast.   Yes [provider]  gemfibrozil (LOPID) 600 MG tablet Take 600 mg by mouth 2 (two) times daily before a meal.   Yes [provider]  hydroxychloroquine (PLAQUENIL) 200 MG tablet Take 200 mg by mouth 2 (two) times daily.    Yes [provider]  leflunomide (ARAVA) 20 MG tablet Take 20 mg  by mouth daily.    Yes [provider]  losartan (COZAAR) 100 MG tablet Take 100 mg by mouth daily.   Yes [provider]  Multiple Vitamin (MULTIVITAMIN) tablet Take 1 tablet by mouth daily.   Yes [provider]  Multiple Vitamins-Minerals (PRESERVISION AREDS) CAPS Take 1 capsule by mouth 2 (two) times daily.   Yes [provider]  omeprazole (PRILOSEC) 20 MG capsule Take 20 mg by mouth 2 (two) times daily before a meal.    Yes [provider]  pravastatin (PRAVACHOL) 80 MG tablet Take 80 mg by mouth at  bedtime.    Yes [provider]  predniSONE (DELTASONE) 5 MG tablet Take 5 mg by mouth daily.    Yes [provider]  sertraline (ZOLOFT) 50 MG tablet Take 50 mg by mouth at bedtime.    Yes [provider]  cephALEXin (KEFLEX) 500 MG capsule Take 1 capsule (500 mg total) by mouth 2 (two) times daily. 04/13/17   Lisa Roca, MD  diphenoxylate-atropine (LOMOTIL) 2.5-0.025 MG tablet Take by mouth 4 (four) times daily as needed for diarrhea or loose stools.    [provider]  LORazepam (ATIVAN) 1 MG tablet Take 1 mg by mouth every 8 (eight) hours as needed.     [provider]  oxyCODONE (OXY IR/ROXICODONE) 5 MG immediate release tablet Take 1-2 tablets (5-10 mg total) by mouth every 3 (three) hours as needed for breakthrough pain. Patient not taking: Reported on 07/28/2016 04/06/16   Duanne Guess, PA-C  traMADol (ULTRAM) 50 MG tablet Take 2 tablets (100 mg total) by mouth 2 (two) times daily. Patient not taking: Reported on 04/13/2017 04/06/16   Duanne Guess, PA-C    Allergies  Allergen Reactions  . Lodine [Etodolac] Hives and Itching  . Methotrexate Derivatives Other (See Comments)    Flu like symptoms  . Norvasc [Amlodipine]   . Pentasa [Mesalamine Er]   . Red Yeast Rice Extract   . Vioxx [Rofecoxib]   . Levaquin [Levofloxacin] Itching, Rash and Other (See Comments)    Muscle pain  . Sulfa Antibiotics Itching and Rash    Family History  Problem Relation Age of Onset  . Breast cancer Neg Hx     Social History Social History  Substance Use Topics  . Smoking status: Never Smoker  . Smokeless tobacco: Never Used  . Alcohol use No    Review of Systems  Constitutional: Negative for fever. Eyes: Negative for visual changes. ENT: Negative for sore throat. Cardiovascular: Negative for chest pain. Respiratory: Negative for shortness of breath. Gastrointestinal: Negative for abdominal pain. Genitourinary: Negative for  dysuria. Musculoskeletal: Negative for back pain. Skin: Negative for rash. Neurological: Negative for headache.  ____________________________________________   PHYSICAL EXAM:  VITAL SIGNS: ED Triage Vitals [04/13/17 0944]  Enc Vitals Group     BP 124/67     Pulse Rate 94     Resp 18     Temp 98.9 F (37.2 C)     Temp Source Oral     SpO2 97 %     Weight 128 lb (58.1 kg)     Height 5' 4"  (1.626 m)     Head Circumference      Peak Flow      Pain Score      Pain Loc      Pain Edu?      Excl. in [place]?     Constitutional: Alert and oriented. Well appearing and in  no distress. HEENT   Head: Normocephalic and atraumatic.      Eyes: Conjunctivae are normal. Pupils equal and round.       Ears:         Nose: No congestion/rhinnorhea.   Mouth/Throat: Mucous membranes are mildly dry.   Neck: No stridor. Cardiovascular/Chest: Normal rate, regular rhythm.  No murmurs, rubs, or gallops. Respiratory: Normal respiratory effort without tachypnea nor retractions. Breath sounds are clear and equal bilaterally. No wheezes/rales/rhonchi. Gastrointestinal: Soft. No distention, no guarding, no rebound. Nontender.   Genitourinary/rectal:Deferred Musculoskeletal: Nontender with normal range of motion in all extremities. No joint effusions.  No lower extremity tenderness.  No edema. Neurologic:  Normal speech and language. No gross or focal neurologic deficits are appreciated. Skin:  Skin is warm, dry and intact. No rash noted. Psychiatric: Mood and affect are normal. Speech and behavior are normal. Patient exhibits appropriate insight and judgment.   ____________________________________________  LABS (pertinent positives/negatives) I, Lisa Roca, MD the attending physician have reviewed the labs noted below.  Labs Reviewed  COMPREHENSIVE METABOLIC PANEL - Abnormal; Notable for the following:       Result Value   Potassium 3.1 (*)    CO2 17 (*)    Calcium 8.5 (*)    All other  components within normal limits  CBC - Abnormal; Notable for the following:    RBC 3.60 (*)    Hemoglobin 11.5 (*)    HCT 33.5 (*)    Platelets 135 (*)    All other components within normal limits  URINALYSIS, COMPLETE (UACMP) WITH MICROSCOPIC - Abnormal; Notable for the following:    Color, Urine AMBER (*)    APPearance HAZY (*)    Hgb urine dipstick SMALL (*)    Ketones, ur 20 (*)    Protein, ur 100 (*)    Leukocytes, UA MODERATE (*)    Bacteria, UA MANY (*)    Squamous Epithelial / LPF 0-5 (*)    All other components within normal limits  URINE CULTURE  GASTROINTESTINAL PANEL BY PCR, STOOL (REPLACES STOOL CULTURE)  LIPASE, BLOOD    ____________________________________________    EKG I, Lisa Roca, MD, the attending physician have personally viewed and interpreted all ECGs.  none ____________________________________________  RADIOLOGY All Xrays were viewed by me.  Imaging interpreted by Radiologist, and I, Lisa Roca, MD the attending physician have reviewed the radiologist interpretation noted below.  none __________________________________________  PROCEDURES  Procedure(s) performed: None  Critical Care performed: None  ____________________________________________  No current facility-administered medications on file prior to encounter.    Current Outpatient Prescriptions on File Prior to Encounter  Medication Sig Dispense Refill  . aspirin EC 325 MG tablet Take 325 mg by mouth daily.    . Calcium Carbonate-Vitamin D (CALCIUM 600+D) 600-400 MG-UNIT tablet Take 1 tablet by mouth 2 (two) times daily.    Marland Kitchen diltiazem (CARDIZEM CD) 240 MG 24 hr capsule Take 240 mg by mouth daily.    . ferrous sulfate 325 (65 FE) MG tablet Take 325 mg by mouth daily with breakfast.    . gemfibrozil (LOPID) 600 MG tablet Take 600 mg by mouth 2 (two) times daily before a meal.    . hydroxychloroquine (PLAQUENIL) 200 MG tablet Take 200 mg by mouth 2 (two) times daily.     Marland Kitchen  leflunomide (ARAVA) 20 MG tablet Take 20 mg by mouth daily.     Marland Kitchen losartan (COZAAR) 100 MG tablet Take 100 mg by mouth daily.    Marland Kitchen  Multiple Vitamin (MULTIVITAMIN) tablet Take 1 tablet by mouth daily.    . Multiple Vitamins-Minerals (PRESERVISION AREDS) CAPS Take 1 capsule by mouth 2 (two) times daily.    Marland Kitchen omeprazole (PRILOSEC) 20 MG capsule Take 20 mg by mouth 2 (two) times daily before a meal.     . pravastatin (PRAVACHOL) 80 MG tablet Take 80 mg by mouth at bedtime.     . predniSONE (DELTASONE) 5 MG tablet Take 5 mg by mouth daily.     . sertraline (ZOLOFT) 50 MG tablet Take 50 mg by mouth at bedtime.     . diphenoxylate-atropine (LOMOTIL) 2.5-0.025 MG tablet Take by mouth 4 (four) times daily as needed for diarrhea or loose stools.    Marland Kitchen LORazepam (ATIVAN) 1 MG tablet Take 1 mg by mouth every 8 (eight) hours as needed.     Marland Kitchen oxyCODONE (OXY IR/ROXICODONE) 5 MG immediate release tablet Take 1-2 tablets (5-10 mg total) by mouth every 3 (three) hours as needed for breakthrough pain. (Patient not taking: Reported on 07/28/2016) 60 tablet 0  . traMADol (ULTRAM) 50 MG tablet Take 2 tablets (100 mg total) by mouth 2 (two) times daily. (Patient not taking: Reported on 04/13/2017) 30 tablet 0    ____________________________________________  ED COURSE / ASSESSMENT AND PLAN  Pertinent labs & imaging results that were available during my care of the patient were reviewed by me and considered in my medical decision making (see chart for details).    Ms. Huizenga is overall well-appearing with stable vital signs. She does have full low potassium. She was given IV fluids here as well as potassium supplementation.  Symptoms starting with vomiting and now followed by diarrhea seems potentially related to gastroenteritis or viral initial source especially with going around the community right now. Patient does have a history of ulcerative colitis and was supposed to start her budesonide today. I think this is  still reasonable. I'll then she's having consultations of ulcerative colitis such as perforation or abscess given no abdominal pain and reassuring laboratory studies in terms of normal white blood cell count no fevers.  We discussed holding off on CT scanning at this point time.  Patient was found to have urinary tract infection. A culture was sent. She has allergy to sulfa and Levaquin. She does not think she has any allergy to penicillin or cephalosporin and I will give her Rocephin here. She had Macrobid about 2 weeks ago enzyme would hold off on that for the potential of possible resistance.  Overall she is well-appearing and okay to go home for close follow-up with her primary care doctor and also her GI doctor.  DIFFERENTIAL DIAGNOSIS: including but not limited to ulcerative colitis, appendicitis, urinary tract infection, gastritis,gastroenteritis, perforation, etc.  CONSULTATIONS:   None   Patient / Family / Caregiver informed of clinical course, medical decision-making process, and agree with plan.   I discussed return precautions, follow-up instructions, and discharge instructions with patient and/or family.  Discharge Instructions : You evaluated for diarrhea and as we discussed, I suspect this may be due to a virus, however go ahead and start your medication for ulcerative colitis as well.  You found her urinary tract infection and were given 24-hour IV antibiotic called Rocephin. Start your Keflex tomorrow.  Return to the Emergency Room immediately for any worsening symptoms including concern for dehydration, new or worsening abdominal pain, fever, black or bloody stools, dizziness or passing out, or any other symptoms concerning to you.  ___________________________________________  FINAL CLINICAL IMPRESSION(S) / ED DIAGNOSES   Final diagnoses:  Urinary tract infection without hematuria, site unspecified  Diarrhea, unspecified type              Note: This  dictation was prepared with Dragon dictation. Any transcriptional errors that result from this process are unintentional    Lisa Roca, MD 04/13/17 [phone]

## 2017-04-14 ENCOUNTER — Telehealth: Payer: Self-pay

## 2017-04-14 NOTE — ED Provider Notes (Signed)
This patient was seen for diarrhea and the patient has ulcerative colitis at baseline. Her GI panel was positive for EPEC Escherichia coli. The standard care treatment for this is supportive therapy, but if she is continuing to have worsening symptoms, azithromycin 1 g by mouth as indicated. I will have my nurse call the patient to see how she is doing and if she is improving, no antibiotics will be used. If her symptoms have worsened, a prescription for azithromycin will be called in and we will have her follow-up with her primary care physician or GI doctor if she has one.   Eula Listen, MD 04/14/17 [phone]

## 2017-04-15 LAB — URINE CULTURE: Culture: >=100000 — AB

## 2017-04-16 NOTE — Progress Notes (Signed)
ED Culture report showing >100 K E Coli ESBL resistant to discharge antibiotic Keflex. Spoke with Dr. Mariea Clonts - switch to Macrobid 100 mg po BID x 7 days.   Called patient - she is generally feeling better but thinks her husband gave her a cold. She understands she needs to stop the Keflex and start Macrobid. She has no questions about Macrobid because she has recently taken it. Would like the prescription called in to [facility] in [place].   Spoke with pharmacist at [facility] in [place] [phone]). They will fill Macrobid 100 mg po BID x 7 days, #14 capsules, no refills.   [place] A. [place], [place].D., BCPS Clinical Pharmacist 04/16/2017 14:06

## 2017-04-19 ENCOUNTER — Inpatient Hospital Stay
Admission: EM | Admit: 2017-04-19 | Discharge: 2017-04-25 | DRG: 280 | Disposition: A | Discharge status: Skilled Nursing Facility | Payer: Medicare HMO | Attending: Internal Medicine | Admitting: Internal Medicine

## 2017-04-19 ENCOUNTER — Encounter: Payer: Self-pay | Admitting: Emergency Medicine

## 2017-04-19 DIAGNOSIS — Z961 Presence of intraocular lens: Secondary | ICD-10-CM | POA: Diagnosis present

## 2017-04-19 DIAGNOSIS — I7300 Raynaud's syndrome without gangrene: Secondary | ICD-10-CM | POA: Diagnosis present

## 2017-04-19 DIAGNOSIS — Z23 Encounter for immunization: Secondary | ICD-10-CM

## 2017-04-19 DIAGNOSIS — Z9842 Cataract extraction status, left eye: Secondary | ICD-10-CM

## 2017-04-19 DIAGNOSIS — I10 Essential (primary) hypertension: Secondary | ICD-10-CM | POA: Diagnosis not present

## 2017-04-19 DIAGNOSIS — M6281 Muscle weakness (generalized): Secondary | ICD-10-CM | POA: Diagnosis not present

## 2017-04-19 DIAGNOSIS — K219 Gastro-esophageal reflux disease without esophagitis: Secondary | ICD-10-CM | POA: Diagnosis present

## 2017-04-19 DIAGNOSIS — B9629 Other Escherichia coli [E. coli] as the cause of diseases classified elsewhere: Secondary | ICD-10-CM

## 2017-04-19 DIAGNOSIS — E43 Unspecified severe protein-calorie malnutrition: Secondary | ICD-10-CM | POA: Diagnosis present

## 2017-04-19 DIAGNOSIS — E860 Dehydration: Secondary | ICD-10-CM | POA: Diagnosis present

## 2017-04-19 DIAGNOSIS — A09 Infectious gastroenteritis and colitis, unspecified: Secondary | ICD-10-CM | POA: Diagnosis present

## 2017-04-19 DIAGNOSIS — R2681 Unsteadiness on feet: Secondary | ICD-10-CM | POA: Diagnosis not present

## 2017-04-19 DIAGNOSIS — Z9841 Cataract extraction status, right eye: Secondary | ICD-10-CM

## 2017-04-19 DIAGNOSIS — I480 Paroxysmal atrial fibrillation: Secondary | ICD-10-CM | POA: Diagnosis present

## 2017-04-19 DIAGNOSIS — R001 Bradycardia, unspecified: Secondary | ICD-10-CM | POA: Diagnosis not present

## 2017-04-19 DIAGNOSIS — Z888 Allergy status to other drugs, medicaments and biological substances status: Secondary | ICD-10-CM | POA: Diagnosis not present

## 2017-04-19 DIAGNOSIS — J441 Chronic obstructive pulmonary disease with (acute) exacerbation: Secondary | ICD-10-CM | POA: Diagnosis present

## 2017-04-19 DIAGNOSIS — I214 Non-ST elevation (NSTEMI) myocardial infarction: Secondary | ICD-10-CM | POA: Diagnosis not present

## 2017-04-19 DIAGNOSIS — F329 Major depressive disorder, single episode, unspecified: Secondary | ICD-10-CM | POA: Diagnosis present

## 2017-04-19 DIAGNOSIS — K589 Irritable bowel syndrome without diarrhea: Secondary | ICD-10-CM | POA: Diagnosis present

## 2017-04-19 DIAGNOSIS — Z5189 Encounter for other specified aftercare: Secondary | ICD-10-CM | POA: Diagnosis not present

## 2017-04-19 DIAGNOSIS — Z96652 Presence of left artificial knee joint: Secondary | ICD-10-CM | POA: Diagnosis present

## 2017-04-19 DIAGNOSIS — Z882 Allergy status to sulfonamides status: Secondary | ICD-10-CM | POA: Diagnosis not present

## 2017-04-19 DIAGNOSIS — D508 Other iron deficiency anemias: Secondary | ICD-10-CM | POA: Diagnosis not present

## 2017-04-19 DIAGNOSIS — F064 Anxiety disorder due to known physiological condition: Secondary | ICD-10-CM | POA: Diagnosis not present

## 2017-04-19 DIAGNOSIS — E785 Hyperlipidemia, unspecified: Secondary | ICD-10-CM | POA: Diagnosis not present

## 2017-04-19 DIAGNOSIS — R7989 Other specified abnormal findings of blood chemistry: Secondary | ICD-10-CM | POA: Diagnosis not present

## 2017-04-19 DIAGNOSIS — R0602 Shortness of breath: Secondary | ICD-10-CM | POA: Diagnosis not present

## 2017-04-19 DIAGNOSIS — F419 Anxiety disorder, unspecified: Secondary | ICD-10-CM | POA: Diagnosis present

## 2017-04-19 DIAGNOSIS — Z6822 Body mass index (BMI) 22.0-22.9, adult: Secondary | ICD-10-CM

## 2017-04-19 DIAGNOSIS — K529 Noninfective gastroenteritis and colitis, unspecified: Secondary | ICD-10-CM

## 2017-04-19 DIAGNOSIS — J449 Chronic obstructive pulmonary disease, unspecified: Secondary | ICD-10-CM | POA: Diagnosis not present

## 2017-04-19 DIAGNOSIS — Z7952 Long term (current) use of systemic steroids: Secondary | ICD-10-CM

## 2017-04-19 DIAGNOSIS — I4891 Unspecified atrial fibrillation: Secondary | ICD-10-CM | POA: Diagnosis not present

## 2017-04-19 DIAGNOSIS — Z79899 Other long term (current) drug therapy: Secondary | ICD-10-CM | POA: Diagnosis not present

## 2017-04-19 DIAGNOSIS — Z1612 Extended spectrum beta lactamase (ESBL) resistance: Secondary | ICD-10-CM | POA: Diagnosis not present

## 2017-04-19 DIAGNOSIS — N390 Urinary tract infection, site not specified: Secondary | ICD-10-CM | POA: Diagnosis not present

## 2017-04-19 DIAGNOSIS — D72829 Elevated white blood cell count, unspecified: Secondary | ICD-10-CM | POA: Diagnosis not present

## 2017-04-19 DIAGNOSIS — B9620 Unspecified Escherichia coli [E. coli] as the cause of diseases classified elsewhere: Secondary | ICD-10-CM | POA: Diagnosis present

## 2017-04-19 DIAGNOSIS — R197 Diarrhea, unspecified: Secondary | ICD-10-CM | POA: Diagnosis not present

## 2017-04-19 LAB — URINALYSIS, COMPLETE (UACMP) WITH MICROSCOPIC
BACTERIA UA: NONE SEEN
Bilirubin Urine: NEGATIVE
Glucose, UA: NEGATIVE mg/dL
Hgb urine dipstick: NEGATIVE
Ketones, ur: 80 mg/dL — AB
Leukocytes, UA: NEGATIVE
Nitrite: NEGATIVE
Protein, ur: 30 mg/dL — AB
SPECIFIC GRAVITY, URINE: 1.014 (ref 1.005–1.030)
pH: 5 (ref 5.0–8.0)

## 2017-04-19 LAB — GASTROINTESTINAL PANEL BY PCR, STOOL (REPLACES STOOL CULTURE)
Adenovirus F40/41: NOT DETECTED
Astrovirus: NOT DETECTED
CAMPYLOBACTER SPECIES: NOT DETECTED
CRYPTOSPORIDIUM: NOT DETECTED
CYCLOSPORA CAYETANENSIS: NOT DETECTED
ENTEROAGGREGATIVE E COLI (EAEC): NOT DETECTED
ENTEROPATHOGENIC E COLI (EPEC): NOT DETECTED
ENTEROTOXIGENIC E COLI (ETEC): NOT DETECTED
Entamoeba histolytica: NOT DETECTED
Giardia lamblia: NOT DETECTED
Norovirus GI/GII: NOT DETECTED
PLESIMONAS SHIGELLOIDES: NOT DETECTED
ROTAVIRUS A: NOT DETECTED
SAPOVIRUS (I, II, IV, AND V): NOT DETECTED
SHIGA LIKE TOXIN PRODUCING E COLI (STEC): NOT DETECTED
Salmonella species: NOT DETECTED
Shigella/Enteroinvasive E coli (EIEC): NOT DETECTED
VIBRIO SPECIES: NOT DETECTED
Vibrio cholerae: NOT DETECTED
YERSINIA ENTEROCOLITICA: NOT DETECTED

## 2017-04-19 LAB — LIPID PANEL
CHOLESTEROL: 156 mg/dL (ref 0–200)
HDL: 24 mg/dL — ABNORMAL LOW (ref >40)
LDL Cholesterol: 84 mg/dL (ref 0–99)
Total CHOL/HDL Ratio: 6.5 RATIO
Triglycerides: 239 mg/dL — ABNORMAL HIGH (ref <150)
VLDL: 48 mg/dL — ABNORMAL HIGH (ref 0–40)

## 2017-04-19 LAB — BETA-HYDROXYBUTYRIC ACID: Beta-Hydroxybutyric Acid: 4.56 mmol/L — ABNORMAL HIGH (ref 0.05–0.27)

## 2017-04-19 LAB — PROTIME-INR
INR: 1.33
Prothrombin Time: 16.4 seconds — ABNORMAL HIGH (ref 11.4–15.2)

## 2017-04-19 LAB — COMPREHENSIVE METABOLIC PANEL
ALBUMIN: 3.4 g/dL — AB (ref 3.5–5.0)
ALT: 36 U/L (ref 14–54)
AST: 102 U/L — AB (ref 15–41)
Alkaline Phosphatase: 52 U/L (ref 38–126)
Anion gap: 16 — ABNORMAL HIGH (ref 5–15)
BILIRUBIN TOTAL: 1.3 mg/dL — AB (ref 0.3–1.2)
BUN: 14 mg/dL (ref 6–20)
CO2: 18 mmol/L — ABNORMAL LOW (ref 22–32)
Calcium: 8.8 mg/dL — ABNORMAL LOW (ref 8.9–10.3)
Chloride: 106 mmol/L (ref 101–111)
Creatinine, Ser: 0.73 mg/dL (ref 0.44–1.00)
GFR calc Af Amer: >60 mL/min (ref >60)
GFR calc non Af Amer: >60 mL/min (ref >60)
GLUCOSE: 91 mg/dL (ref 65–99)
POTASSIUM: 3.2 mmol/L — AB (ref 3.5–5.1)
Sodium: 140 mmol/L (ref 135–145)
Total Protein: 7.3 g/dL (ref 6.5–8.1)

## 2017-04-19 LAB — APTT: aPTT: 78 seconds — ABNORMAL HIGH (ref 24–36)

## 2017-04-19 LAB — CBC
HEMATOCRIT: 29.5 % — AB (ref 35.0–47.0)
Hemoglobin: 9.9 g/dL — ABNORMAL LOW (ref 12.0–16.0)
MCH: 31.5 pg (ref 26.0–34.0)
MCHC: 33.5 g/dL (ref 32.0–36.0)
MCV: 94.1 fL (ref 80.0–100.0)
Platelets: 325 10*3/uL (ref 150–440)
RBC: 3.13 MIL/uL — ABNORMAL LOW (ref 3.80–5.20)
RDW: 14.6 % — AB (ref 11.5–14.5)
WBC: 17.3 10*3/uL — ABNORMAL HIGH (ref 3.6–11.0)

## 2017-04-19 LAB — TROPONIN I
Troponin I: 15.78 ng/mL (ref <0.03)
Troponin I: 10.18 ng/mL (ref <0.03)

## 2017-04-19 MED ORDER — HYDROXYCHLOROQUINE SULFATE 200 MG PO TABS
200.0000 mg | ORAL_TABLET | Freq: Two times a day (BID) | ORAL | Status: DC
  Administered 2017-04-19 – 2017-04-25 (×11): 200 mg via ORAL
  Filled 2017-04-19 (×13): qty 1

## 2017-04-19 MED ORDER — DOCUSATE SODIUM 100 MG PO CAPS: 100.0000 mg | ORAL_CAPSULE | Freq: Two times a day (BID) | ORAL | Status: DC | PRN

## 2017-04-19 MED ORDER — FERROUS SULFATE 325 (65 FE) MG PO TABS
325.0000 mg | ORAL_TABLET | Freq: Every day | ORAL | Status: DC
  Administered 2017-04-20 – 2017-04-25 (×5): 325 mg via ORAL
  Filled 2017-04-19 (×5): qty 1

## 2017-04-19 MED ORDER — SERTRALINE HCL 50 MG PO TABS
50.0000 mg | ORAL_TABLET | Freq: Every day | ORAL | Status: DC
  Administered 2017-04-19 – 2017-04-24 (×6): 50 mg via ORAL
  Filled 2017-04-19 (×6): qty 1

## 2017-04-19 MED ORDER — HEPARIN (PORCINE) IN NACL 100-0.45 UNIT/ML-% IJ SOLN
1200.0000 [IU]/h | INTRAMUSCULAR | Status: DC
  Administered 2017-04-19: 650 [IU]/h via INTRAVENOUS
  Administered 2017-04-20: 1050 [IU]/h via INTRAVENOUS
  Administered 2017-04-22: 1200 [IU]/h via INTRAVENOUS
  Filled 2017-04-19 (×5): qty 250

## 2017-04-19 MED ORDER — SODIUM CHLORIDE 0.9 % IV BOLUS (SEPSIS)
1000.0000 mL | Freq: Once | INTRAVENOUS | Status: AC
  Administered 2017-04-19: 1000 mL via INTRAVENOUS

## 2017-04-19 MED ORDER — ONDANSETRON HCL 4 MG/2ML IJ SOLN
4.0000 mg | Freq: Four times a day (QID) | INTRAMUSCULAR | Status: DC | PRN
  Administered 2017-04-19: 4 mg via INTRAVENOUS
  Filled 2017-04-19: qty 2

## 2017-04-19 MED ORDER — SODIUM CHLORIDE 0.9 % IV SOLN: 1.0000 g | INTRAVENOUS | Status: DC

## 2017-04-19 MED ORDER — SODIUM CHLORIDE 0.9 % IV SOLN
INTRAVENOUS | Status: DC
  Administered 2017-04-19 – 2017-04-21 (×3): via INTRAVENOUS

## 2017-04-19 MED ORDER — LEFLUNOMIDE 20 MG PO TABS
20.0000 mg | ORAL_TABLET | Freq: Every day | ORAL | Status: DC
  Administered 2017-04-20 – 2017-04-25 (×5): 20 mg via ORAL
  Filled 2017-04-19 (×6): qty 1

## 2017-04-19 MED ORDER — BUDESONIDE 3 MG PO CPEP
6.0000 mg | ORAL_CAPSULE | Freq: Every day | ORAL | Status: DC
  Administered 2017-04-20 – 2017-04-25 (×6): 6 mg via ORAL
  Filled 2017-04-19 (×7): qty 2

## 2017-04-19 MED ORDER — GEMFIBROZIL 600 MG PO TABS
600.0000 mg | ORAL_TABLET | Freq: Two times a day (BID) | ORAL | Status: DC
  Filled 2017-04-19: qty 1

## 2017-04-19 MED ORDER — HEPARIN SODIUM (PORCINE) 5000 UNIT/ML IJ SOLN: 5000.0000 [IU] | Freq: Three times a day (TID) | INTRAMUSCULAR | Status: DC

## 2017-04-19 MED ORDER — LOSARTAN POTASSIUM 50 MG PO TABS
100.0000 mg | ORAL_TABLET | Freq: Every day | ORAL | Status: DC
  Administered 2017-04-20 – 2017-04-25 (×5): 100 mg via ORAL
  Filled 2017-04-19 (×5): qty 2

## 2017-04-19 MED ORDER — HEPARIN BOLUS VIA INFUSION
3300.0000 [IU] | Freq: Once | INTRAVENOUS | Status: AC
  Administered 2017-04-19: 3300 [IU] via INTRAVENOUS
  Filled 2017-04-19: qty 3300

## 2017-04-19 MED ORDER — PRAVASTATIN SODIUM 40 MG PO TABS
80.0000 mg | ORAL_TABLET | Freq: Every day | ORAL | Status: DC
  Administered 2017-04-19: 80 mg via ORAL
  Filled 2017-04-19: qty 2

## 2017-04-19 MED ORDER — CALCIUM CARBONATE-VITAMIN D 500-200 MG-UNIT PO TABS
1.0000 | ORAL_TABLET | Freq: Two times a day (BID) | ORAL | Status: DC
  Administered 2017-04-19 – 2017-04-25 (×11): 1 via ORAL
  Filled 2017-04-19 (×11): qty 1

## 2017-04-19 MED ORDER — PREDNISONE 10 MG PO TABS
5.0000 mg | ORAL_TABLET | Freq: Every day | ORAL | Status: DC
  Administered 2017-04-20 – 2017-04-21 (×2): 5 mg via ORAL
  Filled 2017-04-19 (×2): qty 1

## 2017-04-19 MED ORDER — LORAZEPAM 1 MG PO TABS
1.0000 mg | ORAL_TABLET | Freq: Four times a day (QID) | ORAL | Status: DC | PRN
  Administered 2017-04-22 (×2): 1 mg via ORAL
  Filled 2017-04-19 (×2): qty 1

## 2017-04-19 MED ORDER — PANTOPRAZOLE SODIUM 40 MG PO TBEC
40.0000 mg | DELAYED_RELEASE_TABLET | Freq: Every day | ORAL | Status: DC
  Administered 2017-04-20 – 2017-04-25 (×5): 40 mg via ORAL
  Filled 2017-04-19 (×5): qty 1

## 2017-04-19 MED ORDER — DILTIAZEM HCL ER COATED BEADS 120 MG PO CP24
240.0000 mg | ORAL_CAPSULE | Freq: Every day | ORAL | Status: DC
  Administered 2017-04-20 – 2017-04-25 (×6): 240 mg via ORAL
  Filled 2017-04-19 (×6): qty 2

## 2017-04-19 MED ORDER — ASPIRIN EC 81 MG PO TBEC
81.0000 mg | DELAYED_RELEASE_TABLET | Freq: Every day | ORAL | Status: DC
  Administered 2017-04-19 – 2017-04-20 (×2): 81 mg via ORAL
  Filled 2017-04-19 (×2): qty 1

## 2017-04-19 MED ORDER — SODIUM CHLORIDE 0.9 % IV SOLN
1.0000 g | INTRAVENOUS | Status: DC
  Administered 2017-04-20: 1 g via INTRAVENOUS
  Filled 2017-04-19: qty 1

## 2017-04-19 MED ORDER — DEXTROSE 5 % IV SOLN
500.0000 mg | Freq: Once | INTRAVENOUS | Status: AC
  Administered 2017-04-19: 500 mg via INTRAVENOUS
  Filled 2017-04-19: qty 500

## 2017-04-19 MED ORDER — ONDANSETRON HCL 4 MG/2ML IJ SOLN
4.0000 mg | Freq: Once | INTRAMUSCULAR | Status: AC
  Administered 2017-04-19: 4 mg via INTRAVENOUS
  Filled 2017-04-19: qty 2

## 2017-04-19 MED ORDER — POTASSIUM CHLORIDE CRYS ER 20 MEQ PO TBCR
20.0000 meq | EXTENDED_RELEASE_TABLET | Freq: Two times a day (BID) | ORAL | Status: DC
  Administered 2017-04-19 – 2017-04-25 (×12): 20 meq via ORAL
  Filled 2017-04-19 (×12): qty 1

## 2017-04-19 MED ORDER — METOPROLOL TARTRATE 25 MG PO TABS
25.0000 mg | ORAL_TABLET | Freq: Two times a day (BID) | ORAL | Status: DC
  Administered 2017-04-19 – 2017-04-21 (×4): 25 mg via ORAL
  Filled 2017-04-19 (×4): qty 1

## 2017-04-19 MED ORDER — ATORVASTATIN CALCIUM 20 MG PO TABS
40.0000 mg | ORAL_TABLET | Freq: Every day | ORAL | Status: DC
  Administered 2017-04-19 – 2017-04-24 (×6): 40 mg via ORAL
  Filled 2017-04-19 (×6): qty 2

## 2017-04-19 MED ORDER — ADULT MULTIVITAMIN W/MINERALS CH
1.0000 | ORAL_TABLET | Freq: Every day | ORAL | Status: DC
  Administered 2017-04-20 – 2017-04-25 (×5): 1 via ORAL
  Filled 2017-04-19 (×5): qty 1

## 2017-04-19 MED ORDER — SODIUM CHLORIDE 0.9 % IV SOLN
1.0000 g | Freq: Once | INTRAVENOUS | Status: AC
  Administered 2017-04-19: 1 g via INTRAVENOUS
  Filled 2017-04-19: qty 1

## 2017-04-19 MED ORDER — INFLUENZA VAC SPLIT HIGH-DOSE 0.5 ML IM SUSY
0.5000 mL | PREFILLED_SYRINGE | INTRAMUSCULAR | Status: AC
  Administered 2017-04-24: 0.5 mL via INTRAMUSCULAR
  Filled 2017-04-19: qty 0.5

## 2017-04-19 MED ORDER — ASPIRIN EC 325 MG PO TBEC
325.0000 mg | DELAYED_RELEASE_TABLET | Freq: Every day | ORAL | Status: DC
  Administered 2017-04-21 – 2017-04-25 (×4): 325 mg via ORAL
  Filled 2017-04-19 (×4): qty 1

## 2017-04-19 NOTE — H&P (Signed)
[name]    MR#:  [id number]  DATE OF BIRTH:  August 05, 1940  DATE OF ADMISSION:  04/19/2017  PRIMARY CARE PHYSICIAN: [name], MD   REQUESTING/REFERRING PHYSICIAN: Rifenbark   CHIEF COMPLAINT:   Chief Complaint  Patient presents with  . Altered Mental Status    HISTORY OF PRESENT ILLNESS: Melissa [name]  is a 76 y.o. female with a known history of Anxiety, COPD, depression, Diverticulosis, HLD, IBS- had colonoscopy and Endoscopy by Dr. Tiffany Kocher done 2-3 months ago- no abnormalities found. Had UTI , came to ER last week- given keflex- and also had diarrhea- after checking GI panel- sent home from ER> Cont to have abd pain, Nausea, not eating well, chills, diarrhea, so brought to ER. From Ur cx last week- noted to have ESBL, and GI panel EPEC. ER physician spoke to ID- suggest azithromycin and Invanz. After admitting the pt, while still in ER- troponin resulted > 15. No c/o chest pain, but have some EKG changes- urgently seen by cardiologist.  PAST MEDICAL HISTORY:   Past Medical History:  Diagnosis Date  . Anemia   . Anxiety   . Arthritis    rheumatoid  . Ataxia   . COPD (chronic obstructive pulmonary disease) ([place])   . Depression   . Diverticulosis   . Excessive falling   . GERD (gastroesophageal reflux disease)   . Hyperlipidemia   . Hypertension   . IBS (irritable bowel syndrome)   . Osteoarthritis   . Psoriasis   . Raynaud's disease without gangrene   . Raynaud's disease without gangrene   . Shortness of breath dyspnea    with exertion    PAST SURGICAL HISTORY: Past Surgical History:  Procedure Laterality Date  . ABDOMINAL HYSTERECTOMY    . APPENDECTOMY    . BACK SURGERY     Spinal fusion with rods and screws  . CHOLECYSTECTOMY    . COLONOSCOPY WITH PROPOFOL N/A 01/29/2017   Procedure: COLONOSCOPY WITH PROPOFOL;  Surgeon: Manya Silvas, MD;  Location[facility] ENDOSCOPY;  Service:  Endoscopy;  Laterality: N/A;  . ESOPHAGOGASTRODUODENOSCOPY (EGD) WITH PROPOFOL N/A 01/29/2017   Procedure: ESOPHAGOGASTRODUODENOSCOPY (EGD) WITH PROPOFOL;  Surgeon: Manya Silvas, MD;  Location: [facility] ENDOSCOPY;  Service: Endoscopy;  Laterality: N/A;  . EYE SURGERY Bilateral    Cataract Extraction with IOL  . KNEE ARTHROSCOPY Left    partial lateral meniscectomy, debridement, excision of Baker's cyst  . ROTATOR CUFF REPAIR Right   . TOTAL KNEE ARTHROPLASTY Left 04/04/2016   Procedure: TOTAL KNEE ARTHROPLASTY;  Surgeon: Hessie Knows, MD;  Location: [facility] ORS;  Service: Orthopedics;  Laterality: Left;    SOCIAL HISTORY:  Social History  Substance Use Topics  . Smoking status: Never Smoker  . Smokeless tobacco: Never Used  . Alcohol use No    FAMILY HISTORY:  Family History  Problem Relation Age of Onset  . CAD Mother   . CAD Father   . Breast cancer Neg Hx     DRUG ALLERGIES:  Allergies  Allergen Reactions  . Lodine [Etodolac] Hives and Itching  . Methotrexate Derivatives Other (See Comments)    Flu like symptoms  . Norvasc [Amlodipine]   . Pentasa [Mesalamine Er]   . Red Yeast Rice Extract   . Vioxx [Rofecoxib]   . Levaquin [Levofloxacin] Itching, Rash and Other (See Comments)    Muscle pain  . Sulfa Antibiotics Itching and Rash    REVIEW  OF SYSTEMS:   CONSTITUTIONAL: No fever, fatigue or weakness.  EYES: No blurred or double vision.  EARS, NOSE, AND THROAT: No tinnitus or ear pain.  RESPIRATORY: No cough, shortness of breath, wheezing or hemoptysis.  CARDIOVASCULAR: No chest pain, orthopnea, edema.  GASTROINTESTINAL: positive for nausea, vomiting, diarrhea or abdominal pain.  GENITOURINARY: No dysuria, hematuria.  ENDOCRINE: No polyuria, nocturia,  HEMATOLOGY: No anemia, easy bruising or bleeding SKIN: No rash or lesion. MUSCULOSKELETAL: No joint pain or arthritis.   NEUROLOGIC: No tingling, numbness, weakness.  PSYCHIATRY: No anxiety or depression.    MEDICATIONS AT HOME:  Prior to Admission medications   Medication Sig Start Date End Date Taking? Authorizing Provider  aspirin EC 325 MG tablet Take 325 mg by mouth daily.   Yes [provider]  budesonide (ENTOCORT EC) 3 MG 24 hr capsule Take 6 mg by mouth daily.   Yes [provider]  Calcium Carbonate-Vitamin D (CALCIUM 600+D) 600-400 MG-UNIT tablet Take 1 tablet by mouth 2 (two) times daily.   Yes [provider]  cephALEXin (KEFLEX) 500 MG capsule Take 1 capsule (500 mg total) by mouth 2 (two) times daily. 04/13/17  Yes Lisa Roca, MD  diltiazem (CARDIZEM CD) 240 MG 24 hr capsule Take 240 mg by mouth daily.   Yes [provider]  ferrous sulfate 325 (65 FE) MG tablet Take 325 mg by mouth daily with breakfast.   Yes [provider]  gemfibrozil (LOPID) 600 MG tablet Take 600 mg by mouth 2 (two) times daily before a meal.   Yes [provider]  hydroxychloroquine (PLAQUENIL) 200 MG tablet Take 200 mg by mouth 2 (two) times daily.    Yes [provider]  leflunomide (ARAVA) 20 MG tablet Take 20 mg by mouth daily.    Yes [provider]  LORazepam (ATIVAN) 1 MG tablet Take 1 mg by mouth every 8 (eight) hours as needed.    Yes [provider]  losartan (COZAAR) 100 MG tablet Take 100 mg by mouth daily.   Yes [provider]  Multiple Vitamin (MULTIVITAMIN) tablet Take 1 tablet by mouth daily.   Yes [provider]  Multiple Vitamins-Minerals (PRESERVISION AREDS) CAPS Take 1 capsule by mouth 2 (two) times daily.   Yes [provider]  omeprazole (PRILOSEC) 20 MG capsule Take 20 mg by mouth 2 (two) times daily before a meal.    Yes [provider]  pravastatin (PRAVACHOL) 80 MG tablet Take 80 mg by mouth at bedtime.    Yes [provider]  predniSONE (DELTASONE) 5 MG tablet Take 5 mg by mouth daily.    Yes [provider]  sertraline (ZOLOFT) 50 MG tablet Take  50 mg by mouth at bedtime.    Yes [provider]  diphenoxylate-atropine (LOMOTIL) 2.5-0.025 MG tablet Take by mouth 4 (four) times daily as needed for diarrhea or loose stools.    [provider]  oxyCODONE (OXY IR/ROXICODONE) 5 MG immediate release tablet Take 1-2 tablets (5-10 mg total) by mouth every 3 (three) hours as needed for breakthrough pain. Patient not taking: Reported on 07/28/2016 04/06/16   Duanne Guess, PA-C  traMADol (ULTRAM) 50 MG tablet Take 2 tablets (100 mg total) by mouth 2 (two) times daily. Patient not taking: Reported on 04/13/2017 04/06/16   Duanne Guess, PA-C      PHYSICAL EXAMINATION:   VITAL SIGNS: Blood pressure (!) 112/51, pulse 62, temperature 98.6 F (37 C), temperature source Oral, resp.  rate 20, height 5' 4"  (1.626 m), weight 55.3 kg (122 lb), SpO2 100 %.  GENERAL:  76 y.o.-year-old patient lying in the bed with no acute distress.  EYES: Pupils equal, round, reactive to light and accommodation. No scleral icterus. Extraocular muscles intact.  HEENT: Head atraumatic, normocephalic. Oropharynx and nasopharynx clear.  NECK:  Supple, no jugular venous distention. No thyroid enlargement, no tenderness.  LUNGS: Normal breath sounds bilaterally, no wheezing, rales,rhonchi or crepitation. No use of accessory muscles of respiration.  CARDIOVASCULAR: S1, S2 normal. No murmurs, rubs, or gallops.  ABDOMEN: Soft, tender, nondistended. Bowel sounds present. No organomegaly or mass.  EXTREMITIES: No pedal edema, cyanosis, or clubbing.  NEUROLOGIC: Cranial nerves II through XII are intact. Muscle strength 4-5/5 in all extremities. Sensation intact. Gait not checked.  PSYCHIATRIC: The patient is alert and oriented x 3.  SKIN: No obvious rash, lesion, or ulcer.   LABORATORY PANEL:   CBC  Recent Labs Lab 04/13/17 0943 04/19/17 1034  WBC 10.3 17.3*  HGB 11.5* 9.9*  HCT 33.5* 29.5*  PLT 135* 325  MCV 93.2 94.1  MCH 31.8 31.5  MCHC 34.2 33.5   RDW 14.4 14.6*   ------------------------------------------------------------------------------------------------------------------  Chemistries   Recent Labs Lab 04/13/17 0943 04/19/17 1034  NA 137 140  K 3.1* 3.2*  CL 105 106  CO2 17* 18*  GLUCOSE 89 91  BUN 16 14  CREATININE 0.78 0.73  CALCIUM 8.5* 8.8*  AST 29 102*  ALT 26 36  ALKPHOS 52 52  BILITOT 1.1 1.3*   ------------------------------------------------------------------------------------------------------------------ estimated creatinine clearance is 51.7 mL/min (by C-G formula based on SCr of 0.73 mg/dL). ------------------------------------------------------------------------------------------------------------------ No results for input(s): TSH, T4TOTAL, T3FREE, THYROIDAB in the last 72 hours.  Invalid input(s): FREET3   Coagulation profile  Recent Labs Lab 04/19/17 1615  INR 1.33   ------------------------------------------------------------------------------------------------------------------- No results for input(s): DDIMER in the last 72 hours. -------------------------------------------------------------------------------------------------------------------  Cardiac Enzymes  Recent Labs Lab 04/19/17 1034  TROPONINI 15.78*   ------------------------------------------------------------------------------------------------------------------ Invalid input(s): POCBNP  ---------------------------------------------------------------------------------------------------------------  Urinalysis    Component Value Date/Time   COLORURINE YELLOW (A) 04/19/2017 1112   APPEARANCEUR CLEAR (A) 04/19/2017 1112   LABSPEC 1.014 04/19/2017 1112   PHURINE 5.0 04/19/2017 1112   GLUCOSEU NEGATIVE 04/19/2017 1112   HGBUR NEGATIVE 04/19/2017 1112   BILIRUBINUR NEGATIVE 04/19/2017 1112   KETONESUR 80 (A) 04/19/2017 1112   PROTEINUR 30 (A) 04/19/2017 1112   NITRITE NEGATIVE 04/19/2017 1112   LEUKOCYTESUR  NEGATIVE 04/19/2017 1112     RADIOLOGY: No results found.  EKG: Orders placed or performed during the hospital encounter of 04/19/17  . ED EKG  . ED EKG  . EKG 12-Lead  . EKG 12-Lead  . Repeat EKG  . Repeat EKG  . EKG 12-Lead  . EKG 12-Lead    IMPRESSION AND PLAN:  * ESBL uti   Invanz per ID.    * EPEC   On Abx, follow with ID.    Recent Colonoscopy done,     As per her Entocort given and tapered Off after one month.   If any questions, may speak to GI. ( Dr.Elliot- her primary)  * NSTEMI   Seen By cardio.    heparine drip, Echo.   ASA, metoprolol.  * hyperlipidemia   Cont Pravastatin.      All the records are reviewed and case discussed with ED provider. Management plans discussed with the patient, family and they are in agreement.  CODE STATUS: Full.    Code Status  Orders        Start     Ordered   04/19/17 1750  Full code  Continuous     04/19/17 1750    Code Status History    Date Active Date Inactive Code Status Order ID Comments User Context   04/04/2016 10:27 AM 04/07/2016  2:51 PM Full Code [id number]  Hessie Knows, MD Inpatient     Daughter in room during my visit.  TOTAL TIME TAKING CARE OF THIS PATIENT: 50  Critical care minutes.    Vaughan Basta M.D on 04/19/2017   Between 7am to 6pm - Pager - [phone]  After 6pm go to [email or web address] - password [email or web address] [facility]  Office  [phone]  CC: Primary care physician; [name], MD   Note: This dictation was prepared with Dragon dictation along with smaller phrase technology. Any transcriptional errors that result from this process are unintentional.

## 2017-04-19 NOTE — ED Triage Notes (Signed)
Patient presents to ED via POV from home with daughter. Patient was recently seen here for N/V/D last Friday. Patient was also dx with a UTI and placed on keflex, last dose was last night. Patient states she has not been able to keep anything down. Daughter also reports patient has been very confused at home.

## 2017-04-19 NOTE — Progress Notes (Addendum)
ANTICOAGULATION CONSULT NOTE - Initial Consult  Pharmacy Consult for Heparin drip Indication: chest pain/ACS  Allergies  Allergen Reactions  . Lodine [Etodolac] Hives and Itching  . Methotrexate Derivatives Other (See Comments)    Flu like symptoms  . Norvasc [Amlodipine]   . Pentasa [Mesalamine Er]   . Red Yeast Rice Extract   . Vioxx [Rofecoxib]   . Levaquin [Levofloxacin] Itching, Rash and Other (See Comments)    Muscle pain  . Sulfa Antibiotics Itching and Rash    Patient Measurements: Height: 5' 4"  (162.6 cm) Weight: 122 lb (55.3 kg) IBW/kg (Calculated) : 54.7 Heparin Dosing Weight: 55.3 kg  Vital Signs: Temp: 98.1 F (36.7 C) (10/04 1021) Temp Source: Oral (10/04 1021) BP: 102/67 (10/04 1430) Pulse Rate: 63 (10/04 1441)  Labs:  Recent Labs  04/19/17 1034  HGB 9.9*  HCT 29.5*  PLT 325  CREATININE 0.73  TROPONINI 15.78*    Estimated Creatinine Clearance: 51.7 mL/min (by C-G formula based on SCr of 0.73 mg/dL).   Medical History: Past Medical History:  Diagnosis Date  . Anemia   . Anxiety   . Arthritis    rheumatoid  . Ataxia   . COPD (chronic obstructive pulmonary disease) ([place])   . Depression   . Diverticulosis   . Excessive falling   . GERD (gastroesophageal reflux disease)   . Hyperlipidemia   . Hypertension   . IBS (irritable bowel syndrome)   . Osteoarthritis   . Psoriasis   . Raynaud's disease without gangrene   . Raynaud's disease without gangrene   . Shortness of breath dyspnea    with exertion    Medications:  Scheduled:  . aspirin EC  81 mg Oral Daily  . atorvastatin  40 mg Oral q1800  . heparin  3,300 Units Intravenous Once  . metoprolol tartrate  25 mg Oral BID  . potassium chloride  20 mEq Oral BID   Infusions:  . sodium chloride 50 mL/hr at 04/19/17 1414  . ertapenem    . heparin      Assessment: 76 yo F to start Heparin drip for ?NSTEMI. Per Med Rec patient only on ASA at home.  Hgb  9.9 Plt  325 INR  1.33 aPTT  78 (*note this was drawn after Heparin drip had been started)  Goal of Therapy:  Heparin level 0.3-0.7 units/ml Monitor platelets by anticoagulation protocol: Yes   Plan:  Give 3300 units bolus x 1 Start heparin infusion at 650 units/hr Check anti-Xa level in 8 hours and daily while on heparin Continue to monitor H&H and platelets  Melissa [name] A 04/19/2017,3:04 PM

## 2017-04-19 NOTE — ED Provider Notes (Addendum)
[facility] Emergency Department Provider Note  ____________________________________________   First MD Initiated Contact with Patient 04/19/17 1057     (approximate)  I have reviewed the triage vital signs and the nursing notes.   HISTORY  Chief Complaint Altered Mental Status   HPI Melissa [name] is a 76 y.o. female who comes to the emergency department with her daughter for roughly 2 weeks of daily loose watery stools fatigue and difficulty tolerating food. She has a past medical history of irritable bowel syndrome and was seen in our emergency department 6 days ago where she was diagnosed with a urinary tract infection and given a dose of ceftriaxone and then discharged home on Keflex. She was called back later to say that the Keflex was inadequate and she was switched to [place]. She took a single dose of Macrobid but it made her nauseated and vomit so she is ceased taking it. She comes to the emergency department today with continued vomiting 6-7 loose watery stools a day. She is unable to keep food down. Her symptoms have been insidious onset slowly progressive. They're worsened with antibiotics and improved with rest somewhat.   Past Medical History:  Diagnosis Date  . Anemia   . Anxiety   . Arthritis    rheumatoid  . Ataxia   . COPD (chronic obstructive pulmonary disease) ([place])   . Depression   . Diverticulosis   . Excessive falling   . GERD (gastroesophageal reflux disease)   . Hyperlipidemia   . Hypertension   . IBS (irritable bowel syndrome)   . Osteoarthritis   . Psoriasis   . Raynaud's disease without gangrene   . Raynaud's disease without gangrene   . Shortness of breath dyspnea    with exertion    Patient Active Problem List   Diagnosis Date Noted  . Colitis 04/19/2017  . UTI due to extended-spectrum beta lactamase (ESBL) producing Escherichia coli 04/19/2017  . Other secondary osteoarthritis of one knee 04/04/2016    Past  Surgical History:  Procedure Laterality Date  . ABDOMINAL HYSTERECTOMY    . APPENDECTOMY    . BACK SURGERY     Spinal fusion with rods and screws  . CHOLECYSTECTOMY    . COLONOSCOPY WITH PROPOFOL N/A 01/29/2017   Procedure: COLONOSCOPY WITH PROPOFOL;  Surgeon: Manya Silvas, MD;  Location[facility] ENDOSCOPY;  Service: Endoscopy;  Laterality: N/A;  . ESOPHAGOGASTRODUODENOSCOPY (EGD) WITH PROPOFOL N/A 01/29/2017   Procedure: ESOPHAGOGASTRODUODENOSCOPY (EGD) WITH PROPOFOL;  Surgeon: Manya Silvas, MD;  Location: [facility] ENDOSCOPY;  Service: Endoscopy;  Laterality: N/A;  . EYE SURGERY Bilateral    Cataract Extraction with IOL  . KNEE ARTHROSCOPY Left    partial lateral meniscectomy, debridement, excision of Baker's cyst  . ROTATOR CUFF REPAIR Right   . TOTAL KNEE ARTHROPLASTY Left 04/04/2016   Procedure: TOTAL KNEE ARTHROPLASTY;  Surgeon: Hessie Knows, MD;  Location: [facility] ORS;  Service: Orthopedics;  Laterality: Left;    Prior to Admission medications   Medication Sig Start Date End Date Taking? Authorizing Provider  aspirin EC 325 MG tablet Take 325 mg by mouth daily.   Yes [provider]  budesonide (ENTOCORT EC) 3 MG 24 hr capsule Take 6 mg by mouth daily.   Yes [provider]  Calcium Carbonate-Vitamin D (CALCIUM 600+D) 600-400 MG-UNIT tablet Take 1 tablet by mouth 2 (two) times daily.   Yes [provider]  cephALEXin (KEFLEX) 500 MG capsule Take 1 capsule (500 mg total) by  mouth 2 (two) times daily. 04/13/17  Yes Lisa Roca, MD  diltiazem (CARDIZEM CD) 240 MG 24 hr capsule Take 240 mg by mouth daily.   Yes [provider]  ferrous sulfate 325 (65 FE) MG tablet Take 325 mg by mouth daily with breakfast.   Yes [provider]  gemfibrozil (LOPID) 600 MG tablet Take 600 mg by mouth 2 (two) times daily before a meal.   Yes [provider]  hydroxychloroquine (PLAQUENIL) 200 MG tablet Take 200 mg by mouth 2 (two) times daily.    Yes  [provider]  leflunomide (ARAVA) 20 MG tablet Take 20 mg by mouth daily.    Yes [provider]  LORazepam (ATIVAN) 1 MG tablet Take 1 mg by mouth every 8 (eight) hours as needed.    Yes [provider]  losartan (COZAAR) 100 MG tablet Take 100 mg by mouth daily.   Yes [provider]  Multiple Vitamin (MULTIVITAMIN) tablet Take 1 tablet by mouth daily.   Yes [provider]  Multiple Vitamins-Minerals (PRESERVISION AREDS) CAPS Take 1 capsule by mouth 2 (two) times daily.   Yes [provider]  omeprazole (PRILOSEC) 20 MG capsule Take 20 mg by mouth 2 (two) times daily before a meal.    Yes [provider]  pravastatin (PRAVACHOL) 80 MG tablet Take 80 mg by mouth at bedtime.    Yes [provider]  predniSONE (DELTASONE) 5 MG tablet Take 5 mg by mouth daily.    Yes [provider]  sertraline (ZOLOFT) 50 MG tablet Take 50 mg by mouth at bedtime.    Yes [provider]  diphenoxylate-atropine (LOMOTIL) 2.5-0.025 MG tablet Take by mouth 4 (four) times daily as needed for diarrhea or loose stools.    [provider]  oxyCODONE (OXY IR/ROXICODONE) 5 MG immediate release tablet Take 1-2 tablets (5-10 mg total) by mouth every 3 (three) hours as needed for breakthrough pain. Patient not taking: Reported on 07/28/2016 04/06/16   Duanne Guess, PA-C  traMADol (ULTRAM) 50 MG tablet Take 2 tablets (100 mg total) by mouth 2 (two) times daily. Patient not taking: Reported on 04/13/2017 04/06/16   Duanne Guess, PA-C    Allergies Lodine [etodolac]; Methotrexate derivatives; Norvasc [amlodipine]; Pentasa [mesalamine er]; Red yeast rice extract; Vioxx [rofecoxib]; Levaquin [levofloxacin]; and Sulfa antibiotics  Family History  Problem Relation Age of Onset  . CAD Mother   . CAD Father   . Breast cancer Neg Hx     Social History Social History  Substance Use Topics  . Smoking status: Never Smoker  .  Smokeless tobacco: Never Used  . Alcohol use No    Review of Systems Constitutional: No fever/chills Eyes: No visual changes. ENT: No sore throat. Cardiovascular: Denies chest pain. Respiratory: Denies shortness of breath. Gastrointestinal: positive for abdominal pain.  positive for nausea, positive for vomiting.  positive for diarrhea.  No constipation. Genitourinary: Negative for dysuria. Musculoskeletal: Negative for back pain. Skin: Negative for rash. Neurological: Negative for headaches, focal weakness or numbness.   ____________________________________________   PHYSICAL EXAM:  VITAL SIGNS: ED Triage Vitals  Enc Vitals Group     BP 04/19/17 1021 (!) 101/49     Pulse Rate 04/19/17 1021 72     Resp 04/19/17 1021 18     Temp 04/19/17 1021 98.1 F (36.7 C)     Temp Source 04/19/17 1021 Oral     SpO2 04/19/17 1021 100 %  Weight 04/19/17 1030 122 lb (55.3 kg)     Height 04/19/17 1030 5' 4"  (1.626 m)     Head Circumference --      Peak Flow --      Pain Score 04/19/17 1049 0     Pain Loc --      Pain Edu? --      Excl. in [place]? --     Constitutional: alert and oriented 4 appears chronically ill and tired Eyes: PERRL EOMI. Head: Atraumatic. Nose: No congestion/rhinnorhea. Mouth/Throat: No trismus Neck: No stridor.   Cardiovascular: Normal rate, regular rhythm. Grossly normal heart sounds.  Good peripheral circulation. Respiratory: Normal respiratory effort.  No retractions. Lungs CTAB and moving good air Gastrointestinal: soft mild diffuse tenderness with no rebound or guarding no peritonitis no focality Musculoskeletal: No lower extremity edema   Neurologic:  Normal speech and language. No gross focal neurologic deficits are appreciated. Skin:  Skin is warm, dry and intact. No rash noted. Psychiatric: Mood and affect are normal. Speech and behavior are normal.    ____________________________________________   DIFFERENTIAL includes but not limited  to  dehydration, deconditioning, failure to thrive, urinary tract infection, infectious diarrhea, C. difficile ____________________________________________   LABS (all labs ordered are listed, but only abnormal results are displayed)  Labs Reviewed  COMPREHENSIVE METABOLIC PANEL - Abnormal; Notable for the following:       Result Value   Potassium 3.2 (*)    CO2 18 (*)    Calcium 8.8 (*)    Albumin 3.4 (*)    AST 102 (*)    Total Bilirubin 1.3 (*)    Anion gap 16 (*)    All other components within normal limits  CBC - Abnormal; Notable for the following:    WBC 17.3 (*)    RBC 3.13 (*)    Hemoglobin 9.9 (*)    HCT 29.5 (*)    RDW 14.6 (*)    All other components within normal limits  GASTROINTESTINAL PANEL BY PCR, STOOL (REPLACES STOOL CULTURE)  TROPONIN I  URINALYSIS, COMPLETE (UACMP) WITH MICROSCOPIC  BETA-HYDROXYBUTYRIC ACID    blood work reviewed and interpreted by me shows an elevated white count which is concerning for infection. Low CO2 and high anion gap is likewise concerning for infection. __________________________________________  EKG  ED ECG REPORT I, Darel Hong, the attending physician, personally viewed and interpreted this ECG.  Date: 04/19/2017 EKG Time:  Rate: 95 Rhythm: atrial fibrillation QRS Axis: normal Intervals: prolonged QTC ST/T Wave abnormalities: inferior and lateral t wave inversions which are new Narrative Interpretation: new t wave changes concerning for demand ischemia  ____________________________________________  RADIOLOGY   ____________________________________________   PROCEDURES  Procedure(s) performed: no  Procedures  Critical Care performed: no  Observation: no ____________________________________________   INITIAL IMPRESSION / ASSESSMENT AND PLAN / ED COURSE  Pertinent labs & imaging results that were available during my care of the patient were reviewed by me and considered in my medical decision  making (see chart for details).  The patient arrives clearly deconditioned and fatigued. On chart review her urinalysis was positive for ESBL but has not been adequately treated. She also had a positive stool culture. I will reach out to infectious diseases and in the meantime give her a liter of fluid.     ----------------------------------------- 12:08 PM on 04/19/2017 -----------------------------------------  The patient has 2 cultures positive from 6 days ago. First she has an ESBL UTI.  Secondly she has EPAC in her stool.  I discussed the case with on-call infectious disease specialist Dr. Ola Spurr who recommends ertapenem for urinary tract infection and azithromycin for the [facility].  The patient has failed outpatient management and she is acidemic, deconditioned, and dehydrated. She requires intravenous antibiotics and intravenous fluids.   ----------------------------------------- 1:50 PM on 04/19/2017 -----------------------------------------  The patient's troponin came back at 16. On review of her EKG she does have inferior and lateral T-wave inversions which are new when compared to last EKG in September 2017. Repeat EKG done here at 1340 is unchanged from previous at 1119.  she has seen Dr. Josefa Half in the past so I reached out to Dr. Ubaldo Glassing on call and he will kindly come evaluate the patient.  She has no chest pain or SOB. ____________________________________________  ----------------------------------------- 2:00 PM on 04/19/2017 -----------------------------------------  Dr. Ubaldo Glassing saw and evaluated the patient and reviewed the EKGs. He believes she is not having an ST elevation myocardial infarction and he will not catheter today. He does recommend treatment for the non-ST elevation myocardial infarction.   FINAL CLINICAL IMPRESSION(S) / ED DIAGNOSES  Final diagnoses:  Dehydration  Diarrhea of infectious origin  Urinary tract infection without hematuria, site unspecified       NEW MEDICATIONS STARTED DURING THIS VISIT:  New Prescriptions   No medications on file     Note:  This document was prepared using Dragon voice recognition software and may include unintentional dictation errors.     Darel Hong, MD 04/19/17 1318    Darel Hong, MD 04/19/17 1352    Darel Hong, MD 04/19/17 1400

## 2017-04-19 NOTE — Consult Note (Signed)
[facility]  CARDIOLOGY CONSULT NOTE  Patient ID: TAELYNN MCELHANNON MRN: [id number] DOB/AGE: Sep 16, 1940 76 y.o.  Admit date: 04/19/2017 Referring Physician Dr. Anselm Jungling Primary Physician Dr. Doy Hutching Primary Cardiologist Dr. Saralyn Pilar Reason for Consultation nstemi  HPI: Patient is a 76 year old female with no prior ischemic heart disease, history of palpitations and tachycardia who presented to the emergency room after 2 weeks of diarrhea and anorexia. During that time patient denied any chest pain or shortness of breath. She states she has been having difficulty keeping any food in due to the severe diarrhea. She had been treated with aspirin 325 mg daily, gemfibrozil, diltiazem 240 mg daily and pravastatin. She is also on losartan. In the emergency room her electrocardiogram showed 1-2 mm of ST elevation in the inferior leads with some T-wave inversions which were new from one year previously. Again patient had no chest pain on presentation were prior to presentation. She is hemodynamically stable. Her initial troponin was 15.78. Her serum potassium is 3.2 sodium 140 BUN and creatinine of 14 and 0.73. She has no melena.  Review of Systems  HENT: Negative.   Eyes: Negative.   Respiratory: Negative.   Cardiovascular: Negative.   Gastrointestinal: Positive for diarrhea and nausea.  Genitourinary: Negative.   Musculoskeletal: Negative.   Skin: Negative.   Neurological: Positive for weakness.  Endo/Heme/Allergies: Negative.   Psychiatric/Behavioral: Negative.     Past Medical History:  Diagnosis Date  . Anemia   . Anxiety   . Arthritis    rheumatoid  . Ataxia   . COPD (chronic obstructive pulmonary disease) ([place])   . Depression   . Diverticulosis   . Excessive falling   . GERD (gastroesophageal reflux disease)   . Hyperlipidemia   . Hypertension   . IBS (irritable bowel syndrome)   . Osteoarthritis   . Psoriasis   . Raynaud's  disease without gangrene   . Raynaud's disease without gangrene   . Shortness of breath dyspnea    with exertion    Family History  Problem Relation Age of Onset  . CAD Mother   . CAD Father   . Breast cancer Neg Hx     Social History   Social History  . Marital status: Married    Spouse name: N/A  . Number of children: N/A  . Years of education: N/A   Occupational History  . Not on file.   Social History Main Topics  . Smoking status: Never Smoker  . Smokeless tobacco: Never Used  . Alcohol use No  . Drug use: No  . Sexual activity: Not on file   Other Topics Concern  . Not on file   Social History Narrative  . No narrative on file    Past Surgical History:  Procedure Laterality Date  . ABDOMINAL HYSTERECTOMY    . APPENDECTOMY    . BACK SURGERY     Spinal fusion with rods and screws  . CHOLECYSTECTOMY    . COLONOSCOPY WITH PROPOFOL N/A 01/29/2017   Procedure: COLONOSCOPY WITH PROPOFOL;  Surgeon: Manya Silvas, MD;  Location: [facility] ENDOSCOPY;  Service: Endoscopy;  Laterality: N/A;  . ESOPHAGOGASTRODUODENOSCOPY (EGD) WITH PROPOFOL N/A 01/29/2017   Procedure: ESOPHAGOGASTRODUODENOSCOPY (EGD) WITH PROPOFOL;  Surgeon: Manya Silvas, MD;  Location: [facility] ENDOSCOPY;  Service: Endoscopy;  Laterality: N/A;  . EYE SURGERY Bilateral    Cataract Extraction with IOL  . KNEE ARTHROSCOPY Left    partial lateral meniscectomy, debridement, excision  of Baker's cyst  . ROTATOR CUFF REPAIR Right   . TOTAL KNEE ARTHROPLASTY Left 04/04/2016   Procedure: TOTAL KNEE ARTHROPLASTY;  Surgeon: Hessie Knows, MD;  Location: [facility] ORS;  Service: Orthopedics;  Laterality: Left;     Prescriptions Prior to Admission  Medication Sig Dispense Refill Last Dose  . aspirin EC 325 MG tablet Take 325 mg by mouth daily.   04/19/2017 at Unknown time  . budesonide (ENTOCORT EC) 3 MG 24 hr capsule Take 6 mg by mouth daily.   04/19/2017 at Unknown time  . Calcium Carbonate-Vitamin D (CALCIUM 600+D)  600-400 MG-UNIT tablet Take 1 tablet by mouth 2 (two) times daily.   04/19/2017 at Unknown time  . cephALEXin (KEFLEX) 500 MG capsule Take 1 capsule (500 mg total) by mouth 2 (two) times daily. 14 capsule 0 04/19/2017 at Unknown time  . diltiazem (CARDIZEM CD) 240 MG 24 hr capsule Take 240 mg by mouth daily.   04/19/2017 at Unknown time  . ferrous sulfate 325 (65 FE) MG tablet Take 325 mg by mouth daily with breakfast.   04/19/2017 at Unknown time  . gemfibrozil (LOPID) 600 MG tablet Take 600 mg by mouth 2 (two) times daily before a meal.   04/19/2017 at Unknown time  . hydroxychloroquine (PLAQUENIL) 200 MG tablet Take 200 mg by mouth 2 (two) times daily.    04/19/2017 at Unknown time  . leflunomide (ARAVA) 20 MG tablet Take 20 mg by mouth daily.    04/19/2017 at Unknown time  . LORazepam (ATIVAN) 1 MG tablet Take 1 mg by mouth every 8 (eight) hours as needed.    04/19/2017 at Unknown time  . losartan (COZAAR) 100 MG tablet Take 100 mg by mouth daily.   04/19/2017 at Unknown time  . Multiple Vitamin (MULTIVITAMIN) tablet Take 1 tablet by mouth daily.   04/19/2017 at Unknown time  . Multiple Vitamins-Minerals (PRESERVISION AREDS) CAPS Take 1 capsule by mouth 2 (two) times daily.   04/19/2017 at Unknown time  . omeprazole (PRILOSEC) 20 MG capsule Take 20 mg by mouth 2 (two) times daily before a meal.    04/19/2017 at Unknown time  . pravastatin (PRAVACHOL) 80 MG tablet Take 80 mg by mouth at bedtime.    04/19/2017 at Unknown time  . predniSONE (DELTASONE) 5 MG tablet Take 5 mg by mouth daily.    04/19/2017 at Unknown time  . sertraline (ZOLOFT) 50 MG tablet Take 50 mg by mouth at bedtime.    04/19/2017 at Unknown time  . diphenoxylate-atropine (LOMOTIL) 2.5-0.025 MG tablet Take by mouth 4 (four) times daily as needed for diarrhea or loose stools.   prn at prn  . oxyCODONE (OXY IR/ROXICODONE) 5 MG immediate release tablet Take 1-2 tablets (5-10 mg total) by mouth every 3 (three) hours as needed for breakthrough pain.  (Patient not taking: Reported on 07/28/2016) 60 tablet 0 Not Taking at Unknown time  . traMADol (ULTRAM) 50 MG tablet Take 2 tablets (100 mg total) by mouth 2 (two) times daily. (Patient not taking: Reported on 04/13/2017) 30 tablet 0 Not Taking at Unknown time    Physical Exam: Blood pressure (!) 112/51, pulse 62, temperature 98.6 F (37 C), temperature source Oral, resp. rate 20, height 5' 4"  (1.626 m), weight 55.3 kg (122 lb), SpO2 100 %.   Wt Readings from Last 1 Encounters:  04/19/17 55.3 kg (122 lb)     General appearance: alert and cooperative Head: Normocephalic, without obvious abnormality, atraumatic Resp: clear to auscultation  bilaterally Chest wall: no tenderness Cardio: regular rate and rhythm GI: soft, non-tender; bowel sounds normal; no masses,  no organomegaly Extremities: extremities normal, atraumatic, no cyanosis or edema Neurologic: Grossly normal  Labs:   Lab Results  Component Value Date   WBC 17.3 (H) 04/19/2017   HGB 9.9 (L) 04/19/2017   HCT 29.5 (L) 04/19/2017   MCV 94.1 04/19/2017   PLT 325 04/19/2017    Recent Labs Lab 04/19/17 1034  NA 140  K 3.2*  CL 106  CO2 18*  BUN 14  CREATININE 0.73  CALCIUM 8.8*  PROT 7.3  BILITOT 1.3*  ALKPHOS 52  ALT 36  AST 102*  GLUCOSE 91   Lab Results  Component Value Date   TROPONINI 15.78 ([place]) 04/19/2017      Radiology:  EKG: Sinus rhythm with probable evolving inferior myocardial infarction.  ASSESSMENT AND PLAN:  Patient is a 76 year old female with no prior cardiac history other than palpitations with a Holter monitor showing no A. fib partially one year ago who was admitted with several weeks of diarrhea. She had no chest pain or shortness of breath. In the emergency room her electrocardiogram showed what appears to be an evolving inferior myocardial infarction. There are no clinical evidence suggesting ST elevation myocardial infarction of present. She has no chest pain or shortness of breath.  Etiology of the diarrhea is unclear. It does not appear to be secondary to any ischemia. Certainly increased demand and volume depletion due to the diarrhea and exacerbated acute ischemic event. Would not proceed directly to cardiac catheterization as her serum troponin is significantly elevated suggesting event occurring 12-24 hours previously. We'll evaluate subsequent troponin levels to see if the levels are declining or increasing. We'll proceed with an echocardiogram to evaluate for structural heart disease. Would place her on heparin, beta blocker, aspirin, statin. Herbloodpressure,wouldaddafterloadreduction.We'llreviewherEFandwallmotion.ConsiderationforinvasiveevaluationwilldependonherGIstatus.Wouldnotproceedwithheartcatheteranyearlierthanatleast24hoursfornowbasedonsymptoms.Considerationandtimingofthiswilldependonhercourse. Signed: Teodoro Spray MD, [facility] 04/19/2017, 4:49 PM

## 2017-04-20 ENCOUNTER — Inpatient Hospital Stay
Admit: 2017-04-20 | Discharge: 2017-04-20 | Disposition: A | Discharge status: Home / Self Care | Payer: Medicare HMO | Attending: Cardiology | Admitting: Cardiology

## 2017-04-20 LAB — BASIC METABOLIC PANEL
ANION GAP: 12 (ref 5–15)
BUN: 13 mg/dL (ref 6–20)
CO2: 17 mmol/L — ABNORMAL LOW (ref 22–32)
Calcium: 8.1 mg/dL — ABNORMAL LOW (ref 8.9–10.3)
Chloride: 114 mmol/L — ABNORMAL HIGH (ref 101–111)
Creatinine, Ser: 0.59 mg/dL (ref 0.44–1.00)
GLUCOSE: 67 mg/dL (ref 65–99)
POTASSIUM: 3.4 mmol/L — AB (ref 3.5–5.1)
SODIUM: 143 mmol/L (ref 135–145)

## 2017-04-20 LAB — CBC
HEMATOCRIT: 26.4 % — AB (ref 35.0–47.0)
HEMOGLOBIN: 8.8 g/dL — AB (ref 12.0–16.0)
MCH: 31.6 pg (ref 26.0–34.0)
MCHC: 33.2 g/dL (ref 32.0–36.0)
MCV: 95.3 fL (ref 80.0–100.0)
Platelets: 242 10*3/uL (ref 150–440)
RBC: 2.77 MIL/uL — AB (ref 3.80–5.20)
RDW: 14.8 % — ABNORMAL HIGH (ref 11.5–14.5)
WBC: 14 10*3/uL — AB (ref 3.6–11.0)

## 2017-04-20 LAB — URINALYSIS, COMPLETE (UACMP) WITH MICROSCOPIC
Bacteria, UA: NONE SEEN
Bilirubin Urine: NEGATIVE
GLUCOSE, UA: NEGATIVE mg/dL
HGB URINE DIPSTICK: NEGATIVE
Ketones, ur: 5 mg/dL — AB
Leukocytes, UA: NEGATIVE
NITRITE: NEGATIVE
Protein, ur: 30 mg/dL — AB
RBC / HPF: NONE SEEN RBC/hpf (ref 0–5)
SPECIFIC GRAVITY, URINE: 1.019 (ref 1.005–1.030)
Squamous Epithelial / LPF: NONE SEEN
pH: 6 (ref 5.0–8.0)

## 2017-04-20 LAB — HEPARIN LEVEL (UNFRACTIONATED)

## 2017-04-20 LAB — TROPONIN I
TROPONIN I: 12.56 ng/mL — AB (ref <0.03)
Troponin I: 15.23 ng/mL (ref <0.03)

## 2017-04-20 MED ORDER — HEPARIN BOLUS VIA INFUSION
1700.0000 [IU] | Freq: Once | INTRAVENOUS | Status: AC
  Administered 2017-04-20: 1700 [IU] via INTRAVENOUS
  Filled 2017-04-20: qty 1700

## 2017-04-20 MED ORDER — GUAIFENESIN-DM 100-10 MG/5ML PO SYRP
5.0000 mL | ORAL_SOLUTION | ORAL | Status: DC | PRN
  Administered 2017-04-20 – 2017-04-25 (×9): 5 mL via ORAL
  Filled 2017-04-20 (×10): qty 5

## 2017-04-20 MED ORDER — HEPARIN BOLUS VIA INFUSION
1800.0000 [IU] | Freq: Once | INTRAVENOUS | Status: AC
  Administered 2017-04-20: 1800 [IU] via INTRAVENOUS
  Filled 2017-04-20: qty 1800

## 2017-04-20 NOTE — Progress Notes (Addendum)
ANTICOAGULATION CONSULT NOTE - Initial Consult  Pharmacy Consult for Heparin drip Indication: chest pain/ACS  Allergies  Allergen Reactions  . Lodine [Etodolac] Hives and Itching  . Methotrexate Derivatives Other (See Comments)    Flu like symptoms  . Norvasc [Amlodipine]   . Pentasa [Mesalamine Er]   . Red Yeast Rice Extract   . Vioxx [Rofecoxib]   . Levaquin [Levofloxacin] Itching, Rash and Other (See Comments)    Muscle pain  . Sulfa Antibiotics Itching and Rash    Patient Measurements: Height: 5' 4"  (162.6 cm) Weight: 122 lb (55.3 kg) IBW/kg (Calculated) : 54.7 Heparin Dosing Weight: 55.3 kg  Vital Signs: Temp: 97.5 F (36.4 C) (10/05 1216) Temp Source: Oral (10/05 1216) BP: 106/56 (10/05 1216) Pulse Rate: 64 (10/05 1216)  Labs:  Recent Labs  04/19/17 1034 04/19/17 1615 04/19/17 2001 04/19/17 2336 04/20/17 0320 04/20/17 0841 04/20/17 1704  HGB 9.9*  --   --   --  8.8*  --   --   HCT 29.5*  --   --   --  26.4*  --   --   PLT 325  --   --   --  242  --   --   APTT  --  78*  --   --   --   --   --   LABPROT  --  16.4*  --   --   --   --   --   INR  --  1.33  --   --   --   --   --   HEPARINUNFRC  --   --   --  <0.10*  --  <0.10* <0.10*  CREATININE 0.73  --   --   --  0.59  --   --   TROPONINI 15.78*  --  10.18* 15.23* 12.56*  --   --     Estimated Creatinine Clearance: 51.7 mL/min (by C-G formula based on SCr of 0.59 mg/dL).   Medical History: Past Medical History:  Diagnosis Date  . Anemia   . Anxiety   . Arthritis    rheumatoid  . Ataxia   . COPD (chronic obstructive pulmonary disease) ([place])   . Depression   . Diverticulosis   . Excessive falling   . GERD (gastroesophageal reflux disease)   . Hyperlipidemia   . Hypertension   . IBS (irritable bowel syndrome)   . Osteoarthritis   . Psoriasis   . Raynaud's disease without gangrene   . Raynaud's disease without gangrene   . Shortness of breath dyspnea    with exertion    Medications:   Scheduled:  . aspirin EC  325 mg Oral Daily  . atorvastatin  40 mg Oral q1800  . budesonide  6 mg Oral Daily  . calcium-vitamin D  1 tablet Oral BID  . diltiazem  240 mg Oral Daily  . ferrous sulfate  325 mg Oral Q breakfast  . hydroxychloroquine  200 mg Oral BID  . Influenza vac split quadrivalent PF  0.5 mL Intramuscular Tomorrow-1000  . leflunomide  20 mg Oral Daily  . losartan  100 mg Oral Daily  . metoprolol tartrate  25 mg Oral BID  . multivitamin with minerals  1 tablet Oral Daily  . pantoprazole  40 mg Oral Daily  . potassium chloride  20 mEq Oral BID  . predniSONE  5 mg Oral Daily  . sertraline  50 mg Oral QHS   Infusions:  . sodium  chloride 50 mL/hr at 04/19/17 1735  . heparin 1,050 Units/hr (04/20/17 1458)    Assessment: 76 yo F to start Heparin drip for ?NSTEMI. Per Med Rec patient only on ASA at home.  Hgb  9.9 Plt  325 INR 1.33 aPTT  78 (*note this was drawn after Heparin drip had been started)  Goal of Therapy:  Heparin level 0.3-0.7 units/ml Monitor platelets by anticoagulation protocol: Yes   Plan:  Give 3300 units bolus x 1 Start heparin infusion at 650 units/hr Check anti-Xa level in 8 hours and daily while on heparin Continue to monitor H&H and platelets   10/04 2330 heparin level <0.1. 1700 unit bolus and increase rate to 850 units/hr. Recheck in 8 hours.  10/05 0941 HL <0.10. Will order 1800 unit bolus and increase rate to 1050 units/hr. Recheck HL in 6 hours.   10/05 1704 HL subtherapeutic x 1. RN Caryl Pina tells me family has been in the room and several times when RN goes in the heparin drip has been paused. She thinks family has been hitting pause instead of silence. Level subtherapeutic so still okay to bolus, but will go up approximately half step. 1800 units IV x 1 bolus and increase rate to 1150 units/hr. Will recheck HL in 6 hours.  Laural Benes, PharmD, BCPS Clinical Pharmacist 04/20/2017 6:43 PM   1006 0100 heparin level 0.28.  800 unit bolus and increase rate to 1250 units/hr. Recheck in 8 hours.  Sim Boast, PharmD, BCPS  04/21/17 1:49 AM

## 2017-04-20 NOTE — Care Management (Signed)
Patient admitted from home.  Treated as outpatient for uti last week.  Patient presented back with increasing abdominal pain and n/v.  Found to have ESBL uti on on IV vancomycin. ID is consulting.   Also ruled in for nstemi. Cardiology is consulting.  Patient on heprin drip. Requested OT consult.

## 2017-04-20 NOTE — Care Management Important Message (Signed)
Important Message  Patient Details  Name: Melissa [name] MRN: [id number] Date of Birth: 27-Aug-1940   Medicare Important Message Given:  Yes Signed IM notice given   Katrina Stack, RN 04/20/2017, 3:36 PM

## 2017-04-20 NOTE — Progress Notes (Signed)
*  PRELIMINARY RESULTS* Echocardiogram 2D Echocardiogram has been performed.  Melissa [name] 04/20/2017, 5:17 PM

## 2017-04-20 NOTE — Evaluation (Signed)
Physical Therapy Evaluation Patient Details Name: Melissa [name] MRN: [id number] DOB: February 28, 1941 Today's Date: 04/20/2017   History of Present Illness  Pt is a 76 y.o. female with recent dx of UTI presenting to hospital with diarrhea, nausea, not eating well.  Pt's troponin noted to be 15.78 (now downtrending).  Pt admitted to hospital with NSTEMI, ESBL UTI, and EPEC.  PMH includes IBS, RA, COPD, ataxia, depression, excessive falling, htn, psoriasis, Raynaud's, SOB with exercise, back surgery, L TKA.  Clinical Impression  Prior to hospital admission, pt was independent (ambulating without AD).  Pt lives with her husband in 1 level home with steps to enter.  Currently pt is CGA with supine to sit, transfers, and ambulation 20 feet with RW.  Distance ambulated limited d/t pt fatigue.  Pt would benefit from skilled PT to address noted impairments and functional limitations (see below for any additional details).  Upon hospital discharge, currently recommend pt discharge to STR to improve strength, balance, and activity tolerance.    Follow Up Recommendations SNF    Equipment Recommendations  Rolling walker with 5" wheels    Recommendations for Other Services       Precautions / Restrictions Precautions Precautions: Fall Restrictions Weight Bearing Restrictions: No      Mobility  Bed Mobility Overal bed mobility: Needs Assistance Bed Mobility: Supine to Sit     Supine to sit: Min guard;HOB elevated     General bed mobility comments: min guard for safety  Transfers Overall transfer level: Needs assistance Equipment used: None Transfers: Sit to/from Stand Sit to Stand: Min guard         General transfer comment: Min guard for safety; increased effort for sit to stand; Able to perform controlled descent into recliner chair from standing  Ambulation/Gait Ambulation/Gait assistance: Min guard Ambulation Distance (Feet): 20 Feet Assistive device: 1 person hand held assist Gait  Pattern/deviations: Step-through pattern;Decreased stride length   Gait velocity interpretation: Below normal speed for age/gender General Gait Details: decreased gait speed; decreased arm swing and trunk rotation; mild toe out bilaterally  Stairs            Wheelchair Mobility    Modified Rankin (Stroke Patients Only)       Balance Overall balance assessment: Needs assistance Sitting-balance support: Feet supported Sitting balance-Leahy Scale: Fair Sitting balance - Comments: able to maintain seated balance at EOB with feet supported   Standing balance support: Single extremity supported Standing balance-Leahy Scale: Fair Standing balance comment: handheld assist x1 for gait                             Pertinent Vitals/Pain Pain Assessment: No/denies pain  Vitals (HR and O2 on room air) stable and WFL throughout treatment session.    Home Living Family/patient expects to be discharged to:: Private residence Living Arrangements: Spouse/significant other Available Help at Discharge: Family Type of Home: House Home Access: Stairs to enter Entrance Stairs-Rails: None Entrance Stairs-Number of Steps: 2 Home Layout: One level Home Equipment: Aspers - single point;Bedside commode;Shower seat (RW or 4ww (not sure which one))      Prior Function Level of Independence: Independent         Comments: Pt was ambulating without any AD.  No recent falls since knee surgery 1 year ago.  Brother lives down the road and can assist with meals.  Husband unable to provide physical assist.     Hand Dominance  Extremity/Trunk Assessment   Upper Extremity Assessment Upper Extremity Assessment: Generalized weakness    Lower Extremity Assessment Lower Extremity Assessment: Generalized weakness    Cervical / Trunk Assessment Cervical / Trunk Assessment: Normal  Communication   Communication: No difficulties  Cognition Arousal/Alertness:  Awake/alert Behavior During Therapy: WFL for tasks assessed/performed Overall Cognitive Status: Within Functional Limits for tasks assessed                                        General Comments   Nursing cleared pt for participation in physical therapy.  Pt agreeable to PT session.  Pt's 2 daughter's in and out during session.    Exercises     Assessment/Plan    PT Assessment Patient needs continued PT services  PT Problem List Decreased strength;Decreased activity tolerance;Decreased balance;Decreased mobility       PT Treatment Interventions Gait training;Stair training;Functional mobility training;Therapeutic activities;Therapeutic exercise;Balance training;Patient/family education;DME instruction    PT Goals (Current goals can be found in the Care Plan section)  Acute Rehab PT Goals Patient Stated Goal: to improve strength PT Goal Formulation: With patient Time For Goal Achievement: 05/04/17 Potential to Achieve Goals: Good    Frequency Min 2X/week   Barriers to discharge  Possibly level of assist.      Co-evaluation               AM-PAC PT "6 Clicks" Daily Activity  Outcome Measure Difficulty turning over in bed (including adjusting bedclothes, sheets and blankets)?: A Little Difficulty moving from lying on back to sitting on the side of the bed? : A Little Difficulty sitting down on and standing up from a chair with arms (e.g., wheelchair, bedside commode, etc,.)?: A Little Help needed moving to and from a bed to chair (including a wheelchair)?: A Little Help needed walking in hospital room?: A Little Help needed climbing 3-5 steps with a railing? : A Lot 6 Click Score: 17    End of Session Equipment Utilized During Treatment: Gait belt Activity Tolerance: Patient limited by fatigue Patient left: in chair;with call bell/phone within reach;with chair alarm set;with family/visitor present Nurse Communication: Mobility status PT Visit  Diagnosis: Muscle weakness (generalized) (M62.81);Unsteadiness on feet (R26.81)    Time: 6754-4920 PT Time Calculation (min) (ACUTE ONLY): 25 min   Charges:   PT Evaluation $PT Eval Low Complexity: 1 Low     PT G Codes:   PT G-Codes **NOT FOR INPATIENT CLASS** Functional Assessment Tool Used: AM-PAC 6 Clicks Basic Mobility Functional Limitation: Mobility: Walking and moving around Mobility: Walking and Moving Around Current Status (F0071): At least 40 percent but less than 60 percent impaired, limited or restricted Mobility: Walking and Moving Around Goal Status [phone]): 0 percent impaired, limited or restricted    Leitha Bleak, PT 04/20/17, 4:51 PM [phone]

## 2017-04-20 NOTE — Progress Notes (Addendum)
Initial Nutrition Assessment  DOCUMENTATION CODES:   Severe malnutrition in context of acute illness/injury  INTERVENTION:  1. Continue Multivitamin with Minerals 2. Patient declined all interventions at this time  NUTRITION DIAGNOSIS:   Malnutrition related to acute illness as evidenced by energy intake < or equal to 50% for > or equal to 5 days, percent weight loss.  GOAL:   Patient will meet greater than or equal to 90% of their needs  MONITOR:   PO intake, I & O's, Labs, Weight trends  REASON FOR ASSESSMENT:   Malnutrition Screening Tool    ASSESSMENT:   Melissa [name] has a PMH of anxiety, COPD, depression, diverticulosis, HLD, IBS had a UTI and diarrhea last week, discharged with keflex. Returns with abd pain, nausea, poor appetite, diarrhea. Found to have ESBL UTI, elevated troponin   Spoke with patient, daughter at bedside. Patient has only drank gatorade and water over the past 2 weeks. She had 1/2C a rice in the ER on Friday but otherwise has had very little intake. She reports a 6 pound/4.6% severe weight loss during this time span. Her appetite seems to be coming back, had some vanilla ice cream, and orange sherbert milk, juice, coffee and grits this morning. Soup, vanilla pudding, milk, and jello ordered for lunch,  Prior to this illness, she was eating bacon and 2 pieces of toast or a bacon sandwich for breakfast, beans and potatoes with occasional meat for lunch and a similar cooked meal for dinner.   Discussed improving calorie and protein intake during and following admission to help patient regain muscle she may have lost. Patient declined any nutrition interventions at this time.  Nutrition-Focused physical exam completed. Findings are moderate fat depletion, moderate muscle depletion, and no edema.   Medications reviewed and include:  Ca-Vit D, Iron, 20K+ BID, Prednisone NS at 62m/hr  Labs reviewed:  K 3.4  Diet Order:  Diet full liquid Room service  appropriate? Yes; Fluid consistency: Thin  Skin:  Reviewed, no issues  Last BM:  04/19/2017 (Type 7)  Height:   Ht Readings from Last 1 Encounters:  04/19/17 5' 4"  (1.626 m)    Weight:   Wt Readings from Last 1 Encounters:  04/19/17 122 lb (55.3 kg)    Ideal Body Weight:  54.54 kg  BMI:  Body mass index is 20.94 kg/m.  Estimated Nutritional Needs:   Kcal:  1300-1600 calories  Protein:  83-94 grams (1.5-1.7g/kg)  Fluid:  >1.5L  EDUCATION NEEDS:   Education needs addressed  WSatira Anis Melissa Franchini, MS, RD LDN Inpatient Clinical Dietitian Pager [phone]

## 2017-04-20 NOTE — Progress Notes (Signed)
[facility] [place] NAME: Jazlene Bares    MR#:  [id number]  DATE OF BIRTH:  April 04, 1941  SUBJECTIVE:  Patient presented with increasing diarrhea for last 2 weeks. Has had so far a 5 bowel movements. Nonbloody greenish stool. Denies any chest pain or shortness of breath.  REVIEW OF SYSTEMS:   Review of Systems  Constitutional: Negative for chills, fever and weight loss.  HENT: Negative for ear discharge, ear pain and nosebleeds.   Eyes: Negative for blurred vision, pain and discharge.  Respiratory: Negative for sputum production, shortness of breath, wheezing and stridor.   Cardiovascular: Negative for chest pain, palpitations, orthopnea and PND.  Gastrointestinal: Positive for diarrhea. Negative for abdominal pain, nausea and vomiting.  Genitourinary: Negative for frequency and urgency.  Musculoskeletal: Negative for back pain and joint pain.  Neurological: Positive for weakness. Negative for sensory change, speech change and focal weakness.  Psychiatric/Behavioral: Negative for depression and hallucinations. The patient is not nervous/anxious.    Tolerating Diet:FLD Tolerating PT: pending  DRUG ALLERGIES:   Allergies  Allergen Reactions  . Lodine [Etodolac] Hives and Itching  . Methotrexate Derivatives Other (See Comments)    Flu like symptoms  . Norvasc [Amlodipine]   . Pentasa [Mesalamine Er]   . Red Yeast Rice Extract   . Vioxx [Rofecoxib]   . Levaquin [Levofloxacin] Itching, Rash and Other (See Comments)    Muscle pain  . Sulfa Antibiotics Itching and Rash    VITALS:  Blood pressure (!) 106/56, pulse 64, temperature (!) 97.5 F (36.4 C), temperature source Oral, resp. rate 18, height 5' 4"  (1.626 m), weight 55.3 kg (122 lb), SpO2 94 %.  PHYSICAL EXAMINATION:   Physical Exam  GENERAL:  76 y.o.-year-old patient lying in the bed with no acute distress.  EYES: Pupils equal, round, reactive to light and accommodation.  No scleral icterus. Extraocular muscles intact.  HEENT: Head atraumatic, normocephalic. Oropharynx and nasopharynx clear.  NECK:  Supple, no jugular venous distention. No thyroid enlargement, no tenderness.  LUNGS: Normal breath sounds bilaterally, no wheezing, rales, rhonchi. No use of accessory muscles of respiration.  CARDIOVASCULAR: S1, S2 normal. No murmurs, rubs, or gallops.  ABDOMEN: Soft, nontender, nondistended. Bowel sounds present. No organomegaly or mass.  EXTREMITIES: No cyanosis, clubbing or edema b/l.    NEUROLOGIC: Cranial nerves II through XII are intact. No focal Motor or sensory deficits b/l.   PSYCHIATRIC:  patient is alert and oriented x 3.  SKIN: No obvious rash, lesion, or ulcer.   LABORATORY PANEL:  CBC  Recent Labs Lab 04/20/17 0320  WBC 14.0*  HGB 8.8*  HCT 26.4*  PLT 242    Chemistries   Recent Labs Lab 04/19/17 1034 04/20/17 0320  NA 140 143  K 3.2* 3.4*  CL 106 114*  CO2 18* 17*  GLUCOSE 91 67  BUN 14 13  CREATININE 0.73 0.59  CALCIUM 8.8* 8.1*  AST 102*  --   ALT 36  --   ALKPHOS 52  --   BILITOT 1.3*  --    Cardiac Enzymes  Recent Labs Lab 04/20/17 0320  TROPONINI 12.56*   RADIOLOGY:  No results found. ASSESSMENT AND PLAN:   Araminta Zorn  is a 76 y.o. female with a known history of Anxiety, COPD, depression, Diverticulosis, HLD, IBS- had colonoscopy and Endoscopy by Dr. Tiffany Kocher done 2-3 months ago- no abnormalities found. Had UTI , came to ER last week- given keflex- and also had diarrhea-  after checking GI panel- sent home from ER> Cont to have abd pain, Nausea, not eating well, chills, diarrhea, so brought to ER.  * Acute NSTEMI   Seen By cardiology Dr Ubaldo Glassing. Will consider LHC next week -Cont IV heparin drip, Echo.  -ASA, metoprolol.  * ESBL uti   Invanz per ID.    * EPEC   On Abx, follow with ID.    Recent Colonoscopy done,     As per her Entocort given and tapered Off after one month.   If any questions, may speak to  GI. ( Dr.Elliot- her primary)  * hyperlipidemia   Cont Pravastatin.  D/w dters  Case discussed with Care Management/Social Worker. Management plans discussed with the patient, family and they are in agreement.  CODE STATUS: Full  DVT Prophylaxis: heparin gtt  TOTAL TIME TAKING CARE OF THIS PATIENT: *30* minutes.  >50% time spent on counselling and coordination of care  POSSIBLE D/C IN *1-2* DAYS, DEPENDING ON CLINICAL CONDITION.  Note: This dictation was prepared with Dragon dictation along with smaller phrase technology. Any transcriptional errors that result from this process are unintentional.  Ericah Scotto M.D on 04/20/2017 at 3:32 PM  Between 7am to 6pm - Pager - [phone]  After 6pm go to [email or web address] - password [email or web address] [facility]  Office  [phone]  CC: Primary care physician; [name], MDPatient ID: [name], female   DOB: August 08, 1940, 76 y.o.   MRN: [id number]

## 2017-04-20 NOTE — Progress Notes (Signed)
ANTICOAGULATION CONSULT NOTE - Initial Consult  Pharmacy Consult for Heparin drip Indication: chest pain/ACS  Allergies  Allergen Reactions  . Lodine [Etodolac] Hives and Itching  . Methotrexate Derivatives Other (See Comments)    Flu like symptoms  . Norvasc [Amlodipine]   . Pentasa [Mesalamine Er]   . Red Yeast Rice Extract   . Vioxx [Rofecoxib]   . Levaquin [Levofloxacin] Itching, Rash and Other (See Comments)    Muscle pain  . Sulfa Antibiotics Itching and Rash    Patient Measurements: Height: 5' 4"  (162.6 cm) Weight: 122 lb (55.3 kg) IBW/kg (Calculated) : 54.7 Heparin Dosing Weight: 55.3 kg  Vital Signs: Temp: 98.1 F (36.7 C) (10/05 0328) Temp Source: Oral (10/05 0328) BP: 112/58 (10/05 0916) Pulse Rate: 61 (10/05 0916)  Labs:  Recent Labs  04/19/17 1034 04/19/17 1615 04/19/17 2001 04/19/17 2336 04/20/17 0320 04/20/17 0841  HGB 9.9*  --   --   --  8.8*  --   HCT 29.5*  --   --   --  26.4*  --   PLT 325  --   --   --  242  --   APTT  --  78*  --   --   --   --   LABPROT  --  16.4*  --   --   --   --   INR  --  1.33  --   --   --   --   HEPARINUNFRC  --   --   --  <0.10*  --  <0.10*  CREATININE 0.73  --   --   --  0.59  --   TROPONINI 15.78*  --  10.18* 15.23* 12.56*  --     Estimated Creatinine Clearance: 51.7 mL/min (by C-G formula based on SCr of 0.59 mg/dL).   Medical History: Past Medical History:  Diagnosis Date  . Anemia   . Anxiety   . Arthritis    rheumatoid  . Ataxia   . COPD (chronic obstructive pulmonary disease) ([place])   . Depression   . Diverticulosis   . Excessive falling   . GERD (gastroesophageal reflux disease)   . Hyperlipidemia   . Hypertension   . IBS (irritable bowel syndrome)   . Osteoarthritis   . Psoriasis   . Raynaud's disease without gangrene   . Raynaud's disease without gangrene   . Shortness of breath dyspnea    with exertion    Medications:  Scheduled:  . aspirin EC  325 mg Oral Daily  . aspirin EC   81 mg Oral Daily  . atorvastatin  40 mg Oral q1800  . budesonide  6 mg Oral Daily  . calcium-vitamin D  1 tablet Oral BID  . diltiazem  240 mg Oral Daily  . ferrous sulfate  325 mg Oral Q breakfast  . gemfibrozil  600 mg Oral BID AC  . heparin  1,800 Units Intravenous Once  . hydroxychloroquine  200 mg Oral BID  . Influenza vac split quadrivalent PF  0.5 mL Intramuscular Tomorrow-1000  . leflunomide  20 mg Oral Daily  . losartan  100 mg Oral Daily  . metoprolol tartrate  25 mg Oral BID  . multivitamin with minerals  1 tablet Oral Daily  . pantoprazole  40 mg Oral Daily  . potassium chloride  20 mEq Oral BID  . predniSONE  5 mg Oral Daily  . sertraline  50 mg Oral QHS   Infusions:  . sodium chloride  50 mL/hr at 04/19/17 1735  . ertapenem 1 g (04/20/17 0928)  . heparin 850 Units/hr (04/20/17 0205)    Assessment: 76 yo F to start Heparin drip for ?NSTEMI. Per Med Rec patient only on ASA at home.  Hgb  9.9 Plt  325 INR 1.33 aPTT  78 (*note this was drawn after Heparin drip had been started)  Goal of Therapy:  Heparin level 0.3-0.7 units/ml Monitor platelets by anticoagulation protocol: Yes   Plan:  Give 3300 units bolus x 1 Start heparin infusion at 650 units/hr Check anti-Xa level in 8 hours and daily while on heparin Continue to monitor H&H and platelets   10/04 2330 heparin level <0.1. 1700 unit bolus and increase rate to 850 units/hr. Recheck in 8 hours.  10/05 0941 HL <0.10. Will order 1800 unit bolus and increase rate to 1050 units/hr. Recheck HL in 6 hours.   Pernell Dupre, PharmD, BCPS Clinical Pharmacist 04/20/2017 10:35 AM

## 2017-04-20 NOTE — Progress Notes (Signed)
ANTICOAGULATION CONSULT NOTE - Initial Consult  Pharmacy Consult for Heparin drip Indication: chest pain/ACS  Allergies  Allergen Reactions  . Lodine [Etodolac] Hives and Itching  . Methotrexate Derivatives Other (See Comments)    Flu like symptoms  . Norvasc [Amlodipine]   . Pentasa [Mesalamine Er]   . Red Yeast Rice Extract   . Vioxx [Rofecoxib]   . Levaquin [Levofloxacin] Itching, Rash and Other (See Comments)    Muscle pain  . Sulfa Antibiotics Itching and Rash    Patient Measurements: Height: 5' 4"  (162.6 cm) Weight: 122 lb (55.3 kg) IBW/kg (Calculated) : 54.7 Heparin Dosing Weight: 55.3 kg  Vital Signs: Temp: 98.2 F (36.8 C) (10/04 1936) Temp Source: Oral (10/04 1936) BP: 94/48 (10/04 1936) Pulse Rate: 68 (10/04 1936)  Labs:  Recent Labs  04/19/17 1034 04/19/17 1615 04/19/17 2001 04/19/17 2336  HGB 9.9*  --   --   --   HCT 29.5*  --   --   --   PLT 325  --   --   --   APTT  --  78*  --   --   LABPROT  --  16.4*  --   --   INR  --  1.33  --   --   HEPARINUNFRC  --   --   --  <0.10*  CREATININE 0.73  --   --   --   TROPONINI 15.78*  --  10.18* 15.23*    Estimated Creatinine Clearance: 51.7 mL/min (by C-G formula based on SCr of 0.73 mg/dL).   Medical History: Past Medical History:  Diagnosis Date  . Anemia   . Anxiety   . Arthritis    rheumatoid  . Ataxia   . COPD (chronic obstructive pulmonary disease) ([employer])   . Depression   . Diverticulosis   . Excessive falling   . GERD (gastroesophageal reflux disease)   . Hyperlipidemia   . Hypertension   . IBS (irritable bowel syndrome)   . Osteoarthritis   . Psoriasis   . Raynaud's disease without gangrene   . Raynaud's disease without gangrene   . Shortness of breath dyspnea    with exertion    Medications:  Scheduled:  . aspirin EC  325 mg Oral Daily  . aspirin EC  81 mg Oral Daily  . atorvastatin  40 mg Oral q1800  . budesonide  6 mg Oral Daily  . calcium-vitamin D  1 tablet Oral BID   . diltiazem  240 mg Oral Daily  . ferrous sulfate  325 mg Oral Q breakfast  . gemfibrozil  600 mg Oral BID AC  . heparin  1,700 Units Intravenous Once  . hydroxychloroquine  200 mg Oral BID  . Influenza vac split quadrivalent PF  0.5 mL Intramuscular Tomorrow-1000  . leflunomide  20 mg Oral Daily  . losartan  100 mg Oral Daily  . metoprolol tartrate  25 mg Oral BID  . multivitamin with minerals  1 tablet Oral Daily  . pantoprazole  40 mg Oral Daily  . potassium chloride  20 mEq Oral BID  . pravastatin  80 mg Oral QHS  . predniSONE  5 mg Oral Daily  . sertraline  50 mg Oral QHS   Infusions:  . sodium chloride 50 mL/hr at 04/19/17 1735  . ertapenem    . heparin 650 Units/hr (04/19/17 1537)    Assessment: 76 yo F to start Heparin drip for ?NSTEMI. Per Med Rec patient only on ASA at  home.  Hgb  9.9 Plt  325 INR 1.33 aPTT  78 (*note this was drawn after Heparin drip had been started)  Goal of Therapy:  Heparin level 0.3-0.7 units/ml Monitor platelets by anticoagulation protocol: Yes   Plan:  Give 3300 units bolus x 1 Start heparin infusion at 650 units/hr Check anti-Xa level in 8 hours and daily while on heparin Continue to monitor H&H and platelets   10/04 2330 heparin level <0.1. 1700 unit bolus and increase rate to 850 units/hr. Recheck in 8 hours.  Eugenie Harewood S 04/20/2017,12:55 AM

## 2017-04-20 NOTE — Consult Note (Signed)
Melissa [name]     Reason for Consult:UTI, Diarrhea  Referring Physician: Dolores [name] Date of Admission:  04/19/2017   Principal Problem:   Colitis Active Problems:   UTI due to extended-spectrum beta lactamase (ESBL) producing Escherichia coli   HPI: Melissa [name] is a 76 y.o. female admitted with diarrhea and NV. Had recent dx of UTI with ESBL E coli (although no sxs but UA TTC WBC). She was treated with abx in ED then dced on keflex but changed to macrobid but only took 1 dose due to nausea. Her repeat UA this admit went from [facility] to 0-5.   WBC on admit was 17 but no fevers.  LFTs neg. Troponin + For her diarrhea she had colon EGD and capsule done 2-3 mos ago neg.  Was given one month of enterocort and sxs improved but she stopped after one month and the sxs recurred. Currently no fevers, chills, abd pain, dysuria.   Past Medical History:  Diagnosis Date  . Anemia   . Anxiety   . Arthritis    rheumatoid  . Ataxia   . COPD (chronic obstructive pulmonary [name]) ([place])   . Depression   . Diverticulosis   . Excessive falling   . GERD (gastroesophageal reflux [name])   . Hyperlipidemia   . Hypertension   . IBS (irritable bowel syndrome)   . Osteoarthritis   . Psoriasis   . Raynaud's [name] without gangrene   . Raynaud's [name] without gangrene   . Shortness of breath dyspnea    with exertion   Past Surgical History:  Procedure Laterality Date  . ABDOMINAL HYSTERECTOMY    . APPENDECTOMY    . BACK SURGERY     Spinal fusion with rods and screws  . CHOLECYSTECTOMY    . COLONOSCOPY WITH PROPOFOL N/A 01/29/2017   Procedure: COLONOSCOPY WITH PROPOFOL;  Surgeon: Melissa Silvas, MD;  Location: [facility] ENDOSCOPY;  Service: Endoscopy;  Laterality: N/A;  . ESOPHAGOGASTRODUODENOSCOPY (EGD) WITH PROPOFOL N/A 01/29/2017   Procedure: ESOPHAGOGASTRODUODENOSCOPY (EGD) WITH PROPOFOL;  Surgeon: Melissa Silvas, MD;  Location: [facility] ENDOSCOPY;  Service: Endoscopy;   Laterality: N/A;  . EYE SURGERY Bilateral    Cataract Extraction with IOL  . KNEE ARTHROSCOPY Left    partial lateral meniscectomy, debridement, excision of Baker's cyst  . ROTATOR CUFF REPAIR Right   . TOTAL KNEE ARTHROPLASTY Left 04/04/2016   Procedure: TOTAL KNEE ARTHROPLASTY;  Surgeon: Melissa Knows, MD;  Location: [facility] ORS;  Service: Orthopedics;  Laterality: Left;   Social History  Substance Use Topics  . Smoking status: Never Smoker  . Smokeless tobacco: Never Used  . Alcohol use No   Family History  Problem Relation Age of Onset  . CAD Mother   . CAD Father   . Breast cancer Neg Hx     Allergies:  Allergies  Allergen Reactions  . Lodine [Etodolac] Hives and Itching  . Methotrexate Derivatives Other (See Comments)    Flu like symptoms  . Norvasc [Amlodipine]   . Pentasa [Mesalamine Er]   . Red Yeast Rice Extract   . Vioxx [Rofecoxib]   . Levaquin [Levofloxacin] Itching, Rash and Other (See Comments)    Muscle pain  . Sulfa Antibiotics Itching and Rash    Current antibiotics: Antibiotics Given (last 72 hours)    Date/Time Action Medication Dose Rate   04/19/17 1222 New Bag/Given   azithromycin (ZITHROMAX) 500 mg in dextrose 5 % 250 mL IVPB 500 mg 250 mL/hr  04/19/17 1337 New Bag/Given   ertapenem (INVANZ) 1 g in sodium chloride 0.9 % 50 mL IVPB 1 g 100 mL/hr   04/19/17 2112 Given   hydroxychloroquine (PLAQUENIL) tablet 200 mg 200 mg    04/20/17 0786 Given   hydroxychloroquine (PLAQUENIL) tablet 200 mg 200 mg    04/20/17 0928 New Bag/Given   ertapenem (INVANZ) 1 g in sodium chloride 0.9 % 50 mL IVPB 1 g 100 mL/hr      MEDICATIONS: . aspirin EC  325 mg Oral Daily  . atorvastatin  40 mg Oral q1800  . budesonide  6 mg Oral Daily  . calcium-vitamin D  1 tablet Oral BID  . diltiazem  240 mg Oral Daily  . ferrous sulfate  325 mg Oral Q breakfast  . hydroxychloroquine  200 mg Oral BID  . Influenza vac split quadrivalent PF  0.5 mL Intramuscular Tomorrow-1000   . leflunomide  20 mg Oral Daily  . losartan  100 mg Oral Daily  . metoprolol tartrate  25 mg Oral BID  . multivitamin with minerals  1 tablet Oral Daily  . pantoprazole  40 mg Oral Daily  . potassium chloride  20 mEq Oral BID  . predniSONE  5 mg Oral Daily  . sertraline  50 mg Oral QHS    Review of Systems - 11 systems reviewed and negative per HPI   OBJECTIVE: Temp:  [97.5 F (36.4 C)-98.6 F (37 C)] 97.5 F (36.4 C) (10/05 1216) Pulse Rate:  [61-78] 64 (10/05 1216) Resp:  [16-25] 18 (10/05 0328) BP: (94-123)/(48-67) 106/56 (10/05 1216) SpO2:  [92 %-100 %] 94 % (10/05 1216) Physical Exam  Constitutional:  oriented to person, place, and time. appears well-developed and well-nourished. No distress.  HENT: /AT, PERRLA, no scleral icterus Mouth/Throat: Oropharynx is clear and moist. No oropharyngeal exudate.  Cardiovascular: Normal rate, regular rhythm and normal heart sounds. Exam reveals no gallop and no friction rub.  No murmur heard.  Pulmonary/Chest: Effort normal and breath sounds normal. No respiratory distress.  has no wheezes.  Neck = supple, no nuchal rigidity Abdominal: Soft. Bowel sounds are normal.  exhibits no distension. There is no tenderness.  Lymphadenopathy: no cervical adenopathy. No axillary adenopathy Neurological: alert and oriented to person, place, and time.  Skin: Skin is warm and dry. No rash noted. No erythema.  Psychiatric: a normal mood and affect.  behavior is normal.    LABS: Results for orders placed or performed during the hospital encounter of 04/19/17 (from the past 48 hour(s))  Comprehensive metabolic panel     Status: Abnormal   Collection Time: 04/19/17 10:34 AM  Result Value Ref Range   Sodium 140 135 - 145 mmol/L   Potassium 3.2 (L) 3.5 - 5.1 mmol/L   Chloride 106 101 - 111 mmol/L   CO2 18 (L) 22 - 32 mmol/L   Glucose, Bld 91 65 - 99 mg/dL   BUN 14 6 - 20 mg/dL   Creatinine, Ser 0.73 0.44 - 1.00 mg/dL   Calcium 8.8 (L) 8.9 -  10.3 mg/dL   Total Protein 7.3 6.5 - 8.1 g/dL   Albumin 3.4 (L) 3.5 - 5.0 g/dL   AST 102 (H) 15 - 41 U/L   ALT 36 14 - 54 U/L   Alkaline Phosphatase 52 38 - 126 U/L   Total Bilirubin 1.3 (H) 0.3 - 1.2 mg/dL   GFR calc non Af Amer >60 >60 mL/min   GFR calc Af Amer >60 >60 mL/min  Comment: (NOTE) The eGFR has been calculated using the CKD EPI equation. This calculation has not been validated in all clinical situations. eGFR's persistently <60 mL/min signify possible Chronic Kidney [name].    Anion gap 16 (H) 5 - 15  CBC     Status: Abnormal   Collection Time: 04/19/17 10:34 AM  Result Value Ref Range   WBC 17.3 (H) 3.6 - 11.0 K/uL   RBC 3.13 (L) 3.80 - 5.20 MIL/uL   Hemoglobin 9.9 (L) 12.0 - 16.0 g/dL   HCT 29.5 (L) 35.0 - 47.0 %   MCV 94.1 80.0 - 100.0 fL   MCH 31.5 26.0 - 34.0 pg   MCHC 33.5 32.0 - 36.0 g/dL   RDW 14.6 (H) 11.5 - 14.5 %   Platelets 325 150 - 440 K/uL  Troponin I     Status: Abnormal   Collection Time: 04/19/17 10:34 AM  Result Value Ref Range   Troponin I 15.78 (HH) <0.03 ng/mL    Comment: CRITICAL RESULT CALLED TO, READ BACK BY AND VERIFIED WITH BILL SMITH@1330  ON 04/19/17 BY HKP   Beta-hydroxybutyric acid     Status: Abnormal   Collection Time: 04/19/17 10:34 AM  Result Value Ref Range   Beta-Hydroxybutyric Acid 4.56 (H) 0.05 - 0.27 mmol/L    Comment: RESULT CONFIRMED BY MANUAL DILUTION.  HKP/TFK  Lipid panel     Status: Abnormal   Collection Time: 04/19/17 10:34 AM  Result Value Ref Range   Cholesterol 156 0 - 200 mg/dL   Triglycerides 239 (H) <150 mg/dL   HDL 24 (L) >40 mg/dL   Total CHOL/HDL Ratio 6.5 RATIO   VLDL 48 (H) 0 - 40 mg/dL   LDL Cholesterol 84 0 - 99 mg/dL    Comment:        Total Cholesterol/HDL:CHD Risk Coronary Heart [name] Risk Table                     Men   Women  1/2 Average Risk   3.4   3.3  Average Risk       5.0   4.4  2 X Average Risk   9.6   7.1  3 X Average Risk  23.4   11.0        Use the calculated Patient  Ratio above and the CHD Risk Table to determine the patient's CHD Risk.        ATP III CLASSIFICATION (LDL):  <100     mg/dL   Optimal  100-129  mg/dL   Near or Above                    Optimal  130-159  mg/dL   Borderline  160-189  mg/dL   High  >190     mg/dL   Very High   Urinalysis, Complete w Microscopic     Status: Abnormal   Collection Time: 04/19/17 11:12 AM  Result Value Ref Range   Color, Urine YELLOW (A) YELLOW   APPearance CLEAR (A) CLEAR   Specific Gravity, Urine 1.014 1.005 - 1.030   pH 5.0 5.0 - 8.0   Glucose, UA NEGATIVE NEGATIVE mg/dL   Hgb urine dipstick NEGATIVE NEGATIVE   Bilirubin Urine NEGATIVE NEGATIVE   Ketones, ur 80 (A) NEGATIVE mg/dL   Protein, ur 30 (A) NEGATIVE mg/dL   Nitrite NEGATIVE NEGATIVE   Leukocytes, UA NEGATIVE NEGATIVE   RBC / HPF 0-5 0 - 5 RBC/hpf   WBC, UA 0-5  0 - 5 WBC/hpf   Bacteria, UA NONE SEEN NONE SEEN   Squamous Epithelial / LPF 0-5 (A) NONE SEEN  Gastrointestinal Panel by PCR , Stool     Status: None   Collection Time: 04/19/17 11:12 AM  Result Value Ref Range   Campylobacter species NOT DETECTED NOT DETECTED   Plesimonas shigelloides NOT DETECTED NOT DETECTED   Salmonella species NOT DETECTED NOT DETECTED   Yersinia enterocolitica NOT DETECTED NOT DETECTED   Vibrio species NOT DETECTED NOT DETECTED   Vibrio cholerae NOT DETECTED NOT DETECTED   Enteroaggregative E coli (EAEC) NOT DETECTED NOT DETECTED   Enteropathogenic E coli (EPEC) NOT DETECTED NOT DETECTED   Enterotoxigenic E coli (ETEC) NOT DETECTED NOT DETECTED   Shiga like toxin producing E coli (STEC) NOT DETECTED NOT DETECTED   Shigella/Enteroinvasive E coli (EIEC) NOT DETECTED NOT DETECTED   Cryptosporidium NOT DETECTED NOT DETECTED   Cyclospora cayetanensis NOT DETECTED NOT DETECTED   Entamoeba histolytica NOT DETECTED NOT DETECTED   Giardia lamblia NOT DETECTED NOT DETECTED   Adenovirus F40/41 NOT DETECTED NOT DETECTED   Astrovirus NOT DETECTED NOT DETECTED    Norovirus GI/GII NOT DETECTED NOT DETECTED   Rotavirus A NOT DETECTED NOT DETECTED   Sapovirus (I, II, IV, and V) NOT DETECTED NOT DETECTED  APTT     Status: Abnormal   Collection Time: 04/19/17  4:15 PM  Result Value Ref Range   aPTT 78 (H) 24 - 36 seconds    Comment:        IF BASELINE aPTT IS ELEVATED, SUGGEST PATIENT RISK ASSESSMENT BE USED TO DETERMINE APPROPRIATE ANTICOAGULANT THERAPY.   Protime-INR     Status: Abnormal   Collection Time: 04/19/17  4:15 PM  Result Value Ref Range   Prothrombin Time 16.4 (H) 11.4 - 15.2 seconds   INR 1.33   Troponin I     Status: Abnormal   Collection Time: 04/19/17  8:01 PM  Result Value Ref Range   Troponin I 10.18 (HH) <0.03 ng/mL    Comment: CRITICAL VALUE NOTED. VALUE IS CONSISTENT WITH PREVIOUSLY REPORTED/CALLED VALUE.  TFK  Heparin level (unfractionated)     Status: Abnormal   Collection Time: 04/19/17 11:36 PM  Result Value Ref Range   Heparin Unfractionated <0.10 (L) 0.30 - 0.70 IU/mL    Comment:        IF HEPARIN RESULTS ARE BELOW EXPECTED VALUES, AND PATIENT DOSAGE HAS BEEN CONFIRMED, SUGGEST FOLLOW UP TESTING OF ANTITHROMBIN III LEVELS.   Troponin I     Status: Abnormal   Collection Time: 04/19/17 11:36 PM  Result Value Ref Range   Troponin I 15.23 (HH) <0.03 ng/mL    Comment: CRITICAL RESULT CALLED TO, READ BACK BY AND VERIFIED WITH MARCELL TURNER AT 0028 04/20/17 ALV   Basic metabolic panel     Status: Abnormal   Collection Time: 04/20/17  3:20 AM  Result Value Ref Range   Sodium 143 135 - 145 mmol/L   Potassium 3.4 (L) 3.5 - 5.1 mmol/L   Chloride 114 (H) 101 - 111 mmol/L   CO2 17 (L) 22 - 32 mmol/L   Glucose, Bld 67 65 - 99 mg/dL   BUN 13 6 - 20 mg/dL   Creatinine, Ser 0.59 0.44 - 1.00 mg/dL   Calcium 8.1 (L) 8.9 - 10.3 mg/dL   GFR calc non Af Amer >60 >60 mL/min   GFR calc Af Amer >60 >60 mL/min    Comment: (NOTE) The eGFR has been calculated  using the CKD EPI equation. This calculation has not been  validated in all clinical situations. eGFR's persistently <60 mL/min signify possible Chronic Kidney [name].    Anion gap 12 5 - 15  CBC     Status: Abnormal   Collection Time: 04/20/17  3:20 AM  Result Value Ref Range   WBC 14.0 (H) 3.6 - 11.0 K/uL   RBC 2.77 (L) 3.80 - 5.20 MIL/uL   Hemoglobin 8.8 (L) 12.0 - 16.0 g/dL   HCT 26.4 (L) 35.0 - 47.0 %   MCV 95.3 80.0 - 100.0 fL   MCH 31.6 26.0 - 34.0 pg   MCHC 33.2 32.0 - 36.0 g/dL   RDW 14.8 (H) 11.5 - 14.5 %   Platelets 242 150 - 440 K/uL  Troponin I     Status: Abnormal   Collection Time: 04/20/17  3:20 AM  Result Value Ref Range   Troponin I 12.56 (HH) <0.03 ng/mL    Comment: CRITICAL VALUE NOTED. VALUE IS CONSISTENT WITH PREVIOUSLY REPORTED/CALLED VALUE ALV  Heparin level (unfractionated)     Status: Abnormal   Collection Time: 04/20/17  8:41 AM  Result Value Ref Range   Heparin Unfractionated <0.10 (L) 0.30 - 0.70 IU/mL    Comment:        IF HEPARIN RESULTS ARE BELOW EXPECTED VALUES, AND PATIENT DOSAGE HAS BEEN CONFIRMED, SUGGEST FOLLOW UP TESTING OF ANTITHROMBIN III LEVELS. RESULT REPEATED AND VERIFIED    No components found for: ESR, C REACTIVE PROTEIN MICRO: Recent Results (from the past 720 hour(s))  Urine culture     Status: Abnormal   Collection Time: 04/13/17  9:43 AM  Result Value Ref Range Status   Specimen Description URINE, RANDOM  Final   Special Requests NONE  Final   Culture (A)  Final    >=100,000 COLONIES/mL ESCHERICHIA COLI Confirmed Extended Spectrum Beta-Lactamase Producer (ESBL).  In bloodstream infections from ESBL organisms, carbapenems are preferred over piperacillin/tazobactam. They are shown to have a lower risk of mortality. Performed at [facility], [place] [address], [place], [place] [address]    Report Status 04/15/2017 FINAL  Final   Organism ID, Bacteria ESCHERICHIA COLI (A)  Final      Susceptibility   Escherichia coli - MIC*    AMPICILLIN >=32 RESISTANT Resistant      CEFAZOLIN >=64 RESISTANT Resistant     CEFTRIAXONE >=64 RESISTANT Resistant     CIPROFLOXACIN >=4 RESISTANT Resistant     GENTAMICIN <=1 SENSITIVE Sensitive     IMIPENEM <=0.25 SENSITIVE Sensitive     NITROFURANTOIN <=16 SENSITIVE Sensitive     TRIMETH/SULFA >=320 RESISTANT Resistant     AMPICILLIN/SULBACTAM >=32 RESISTANT Resistant     PIP/TAZO 8 SENSITIVE Sensitive     Extended ESBL POSITIVE Resistant     * >=100,000 COLONIES/mL ESCHERICHIA COLI  Gastrointestinal Panel by PCR , Stool     Status: Abnormal   Collection Time: 04/13/17  4:16 PM  Result Value Ref Range Status   Campylobacter species NOT DETECTED NOT DETECTED Final   Plesimonas shigelloides NOT DETECTED NOT DETECTED Final   Salmonella species NOT DETECTED NOT DETECTED Final   Yersinia enterocolitica NOT DETECTED NOT DETECTED Final   Vibrio species NOT DETECTED NOT DETECTED Final   Vibrio cholerae NOT DETECTED NOT DETECTED Final   Enteroaggregative E coli (EAEC) NOT DETECTED NOT DETECTED Final   Enteropathogenic E coli (EPEC) DETECTED (A) NOT DETECTED Final    Comment: RESULT CALLED TO, READ BACK BY AND VERIFIED  WITH: HEATHER FISHER AT 1826 04/13/17.PMH    Enterotoxigenic E coli (ETEC) NOT DETECTED NOT DETECTED Final   Shiga like toxin producing E coli (STEC) NOT DETECTED NOT DETECTED Final   Shigella/Enteroinvasive E coli (EIEC) NOT DETECTED NOT DETECTED Final   Cryptosporidium NOT DETECTED NOT DETECTED Final   Cyclospora cayetanensis NOT DETECTED NOT DETECTED Final   Entamoeba histolytica NOT DETECTED NOT DETECTED Final   Giardia lamblia NOT DETECTED NOT DETECTED Final   Adenovirus F40/41 NOT DETECTED NOT DETECTED Final   Astrovirus NOT DETECTED NOT DETECTED Final   Norovirus GI/GII NOT DETECTED NOT DETECTED Final   Rotavirus A NOT DETECTED NOT DETECTED Final   Sapovirus (I, II, IV, and V) NOT DETECTED NOT DETECTED Final  Gastrointestinal Panel by PCR , Stool     Status: None   Collection Time: 04/19/17 11:12 AM   Result Value Ref Range Status   Campylobacter species NOT DETECTED NOT DETECTED Final   Plesimonas shigelloides NOT DETECTED NOT DETECTED Final   Salmonella species NOT DETECTED NOT DETECTED Final   Yersinia enterocolitica NOT DETECTED NOT DETECTED Final   Vibrio species NOT DETECTED NOT DETECTED Final   Vibrio cholerae NOT DETECTED NOT DETECTED Final   Enteroaggregative E coli (EAEC) NOT DETECTED NOT DETECTED Final   Enteropathogenic E coli (EPEC) NOT DETECTED NOT DETECTED Final   Enterotoxigenic E coli (ETEC) NOT DETECTED NOT DETECTED Final   Shiga like toxin producing E coli (STEC) NOT DETECTED NOT DETECTED Final   Shigella/Enteroinvasive E coli (EIEC) NOT DETECTED NOT DETECTED Final   Cryptosporidium NOT DETECTED NOT DETECTED Final   Cyclospora cayetanensis NOT DETECTED NOT DETECTED Final   Entamoeba histolytica NOT DETECTED NOT DETECTED Final   Giardia lamblia NOT DETECTED NOT DETECTED Final   Adenovirus F40/41 NOT DETECTED NOT DETECTED Final   Astrovirus NOT DETECTED NOT DETECTED Final   Norovirus GI/GII NOT DETECTED NOT DETECTED Final   Rotavirus A NOT DETECTED NOT DETECTED Final   Sapovirus (I, II, IV, and V) NOT DETECTED NOT DETECTED Final    IMAGING: No results found.  Assessment:   Melissa [name] is a 76 y.o. female admitted with diarrhea after stopping enterocort which she was on for one month with improvement in her chronic diarrhea which had been worked up by Dr Vira Agar.  Since stopping it she has had mod to severe diarrhea and occas nv. She had dx of UTI with ESBL E coli a week ago but had no sxs - dd have TNTC WBC on UA. Treated with keflex until cx showed ESBL so took one dose macrobid but did not tolerate it. She also had EPEC on Stool PCR but repeat neg. Now main issue is the diarrhea and has a NSTEMI. Her wbc is mildly elevated but is on steroids 5 mg and had MI which both can raise WBC.  Current UA with 0-5 wbc. I do not think she has active infection so would stop  abx and monitor   Recommendations DC abx and monitor for fevers Cont enterocort and would discuss with GI other measures to control her diarrhea.   Thank you very much for allowing me to participate in the care of this patient. Please call with questions.   Melissa [name]. Melissa Spurr, MD

## 2017-04-21 ENCOUNTER — Inpatient Hospital Stay: Payer: Medicare HMO

## 2017-04-21 LAB — HEPARIN LEVEL (UNFRACTIONATED)
HEPARIN UNFRACTIONATED: 0.68 [IU]/mL (ref 0.30–0.70)
HEPARIN UNFRACTIONATED: 0.71 [IU]/mL — AB (ref 0.30–0.70)
Heparin Unfractionated: 0.28 IU/mL — ABNORMAL LOW (ref 0.30–0.70)

## 2017-04-21 LAB — ECHOCARDIOGRAM COMPLETE
HEIGHTINCHES: 64 in
WEIGHTICAEL: 1952 [oz_av]

## 2017-04-21 LAB — CBC
HEMATOCRIT: 25 % — AB (ref 35.0–47.0)
HEMOGLOBIN: 8.2 g/dL — AB (ref 12.0–16.0)
MCH: 30.7 pg (ref 26.0–34.0)
MCHC: 32.9 g/dL (ref 32.0–36.0)
MCV: 93.4 fL (ref 80.0–100.0)
Platelets: 256 10*3/uL (ref 150–440)
RBC: 2.67 MIL/uL — ABNORMAL LOW (ref 3.80–5.20)
RDW: 14.8 % — AB (ref 11.5–14.5)
WBC: 14.6 10*3/uL — ABNORMAL HIGH (ref 3.6–11.0)

## 2017-04-21 MED ORDER — IPRATROPIUM-ALBUTEROL 0.5-2.5 (3) MG/3ML IN SOLN
3.0000 mL | RESPIRATORY_TRACT | Status: DC | PRN
  Administered 2017-04-21 – 2017-04-23 (×5): 3 mL via RESPIRATORY_TRACT
  Filled 2017-04-21 (×4): qty 3

## 2017-04-21 MED ORDER — PREDNISONE 50 MG PO TABS
50.0000 mg | ORAL_TABLET | Freq: Every day | ORAL | Status: DC
  Administered 2017-04-21 – 2017-04-25 (×4): 50 mg via ORAL
  Filled 2017-04-21 (×4): qty 1

## 2017-04-21 MED ORDER — HEPARIN BOLUS VIA INFUSION
800.0000 [IU] | Freq: Once | INTRAVENOUS | Status: AC
Start: 2017-04-21 — End: 2017-04-21
  Administered 2017-04-21: 800 [IU] via INTRAVENOUS
  Filled 2017-04-21: qty 800

## 2017-04-21 NOTE — Progress Notes (Signed)
Notified by Telemetry that patient has converted to Second degree heart block type 2. Dr. Ubaldo Glassing Notified who stated to stop her metoprolol. Will continue to monitor.

## 2017-04-21 NOTE — NC FL2 (Signed)
[place] LEVEL OF CARE SCREENING TOOL     IDENTIFICATION  Patient Name: Melissa [name] Birthdate: 02/27/1941 Sex: female Admission Date (Current Location): 04/19/2017  [place] and [place] Number:  Engineering geologist and Address:  [facility], [address] [place], [place] [address]      Provider Number: [id number]  Attending Physician Name and Address:  Hillary Bow, MD  Relative Name and Phone Number:  Richardean Chimera (daughter) [phone]    Current Level of Care: Hospital Recommended Level of Care: [place] Prior Approval Number:    Date Approved/Denied:   PASRR Number: [id number]  Discharge Plan: SNF    Current Diagnoses: Patient Active Problem List   Diagnosis Date Noted  . Colitis 04/19/2017  . UTI due to extended-spectrum beta lactamase (ESBL) producing Escherichia coli 04/19/2017  . Other secondary osteoarthritis of one knee 04/04/2016    Orientation RESPIRATION BLADDER Height & Weight     Self, Situation, Place  Normal Incontinent Weight: 122 lb (55.3 kg) Height:  5' 4"  (162.6 cm)  BEHAVIORAL SYMPTOMS/MOOD NEUROLOGICAL BOWEL NUTRITION STATUS      Continent    AMBULATORY STATUS COMMUNICATION OF NEEDS Skin   Extensive Assist Verbally Normal                       Personal Care Assistance Level of Assistance  Bathing, Feeding, Dressing Bathing Assistance: Maximum assistance Feeding assistance: Independent Dressing Assistance: Maximum assistance     Functional Limitations Info             SPECIAL CARE FACTORS FREQUENCY  PT (By licensed PT), OT (By licensed OT)     PT Frequency: Up to 5X per day, 5 days per week OT Frequency: Up to 5X per day, 5 days per week            Contractures Contractures Info: Not present    Additional Factors Info  Code Status, Allergies, Psychotropic Code Status Info: Full Allergies Info:  Lodine Etodolac, Methotrexate Derivatives, Norvasc  Amlodipine, Pentasa Mesalamine Er, Red Yeast Rice Extract, Vioxx Rofecoxib, Levaquin Levofloxacin, Sulfa Antibiotics Psychotropic Info: Zoloft, Ativan         Current Medications (04/21/2017):  This is the current hospital active medication list Current Facility-Administered Medications  Medication Dose Route Frequency Provider Last Rate Last Dose  . 0.9 %  sodium chloride infusion   Intravenous Continuous Vaughan Basta, MD 50 mL/hr at 04/21/17 [phone]    . aspirin EC tablet 325 mg  325 mg Oral Daily Vaughan Basta, MD   325 mg at 04/21/17 0940  . atorvastatin (LIPITOR) tablet 40 mg  40 mg Oral q1800 Teodoro Spray, MD   40 mg at 04/20/17 1801  . budesonide (ENTOCORT EC) 24 hr capsule 6 mg  6 mg Oral Daily Vaughan Basta, MD   6 mg at 04/21/17 0942  . calcium-vitamin D (OSCAL WITH D) 500-200 MG-UNIT per tablet 1 tablet  1 tablet Oral BID Vaughan Basta, MD   1 tablet at 04/21/17 0941  . diltiazem (CARDIZEM CD) 24 hr capsule 240 mg  240 mg Oral Daily Vaughan Basta, MD   240 mg at 04/21/17 0941  . docusate sodium (COLACE) capsule 100 mg  100 mg Oral BID PRN Vaughan Basta, MD      . ferrous sulfate tablet 325 mg  325 mg Oral Q breakfast Vaughan Basta, MD   325 mg at 04/21/17 0941  . guaiFENesin-dextromethorphan (ROBITUSSIN DM) 100-10 MG/5ML  syrup 5 mL  5 mL Oral Q4H PRN Fritzi Mandes, MD   5 mL at 04/20/17 2210  . heparin ADULT infusion 100 units/mL (25000 units/249m sodium chloride 0.45%)  1,250 Units/hr Intravenous Continuous FTeodoro Spray MD 12.5 mL/hr at 04/21/17 0224 1,250 Units/hr at 04/21/17 0224  . hydroxychloroquine (PLAQUENIL) tablet 200 mg  200 mg Oral BID VVaughan Basta MD   200 mg at 04/21/17 0942  . Influenza vac split quadrivalent PF (FLUZONE HIGH-DOSE) injection 0.5 mL  0.5 mL Intramuscular Tomorrow-1000 VVaughan Basta MD      . ipratropium-albuterol (DUONEB) 0.5-2.5 (3) MG/3ML nebulizer solution 3 mL  3 mL  Nebulization Q4H PRN SHillary Bow MD   3 mL at 04/21/17 1109  . leflunomide (ARAVA) tablet 20 mg  20 mg Oral Daily VVaughan Basta MD   20 mg at 04/21/17 0942  . LORazepam (ATIVAN) tablet 1 mg  1 mg Oral Q6H PRN VVaughan Basta MD      . losartan (COZAAR) tablet 100 mg  100 mg Oral Daily VVaughan Basta MD   100 mg at 04/21/17 0941  . multivitamin with minerals tablet 1 tablet  1 tablet Oral Daily VVaughan Basta MD   1 tablet at 04/21/17 0941  . ondansetron (ZOFRAN) injection 4 mg  4 mg Intravenous Q6H PRN VVaughan Basta MD   4 mg at 04/19/17 1735  . pantoprazole (PROTONIX) EC tablet 40 mg  40 mg Oral Daily VVaughan Basta MD   40 mg at 04/21/17 0941  . potassium chloride SA (K-DUR,KLOR-CON) CR tablet 20 mEq  20 mEq Oral BID VVaughan Basta MD   20 mEq at 04/21/17 0940  . predniSONE (DELTASONE) tablet 50 mg  50 mg Oral Q breakfast SHillary Bow MD   50 mg at 04/21/17 1233  . sertraline (ZOLOFT) tablet 50 mg  50 mg Oral QHS VVaughan Basta MD   50 mg at 04/20/17 2210     Discharge Medications: Please see discharge summary for a list of discharge medications.  Relevant Imaging Results:  Relevant Lab Results:   Additional Information SS# [id number] KZettie Pho LCSW

## 2017-04-21 NOTE — Evaluation (Signed)
Occupational Therapy Evaluation Patient Details Name: Melissa [name] MRN: [id number] DOB: 30-Jun-1941 Today's Date: 04/21/2017    History of Present Illness Pt is a 76 y.o. female with recent dx of UTI presenting to hospital with diarrhea, nausea, not eating well.  Pt's troponin noted to be 15.78 (now downtrending).  Pt admitted to hospital with NSTEMI, ESBL UTI, and EPEC.  PMH includes IBS, RA, COPD, ataxia, depression, excessive falling, htn, psoriasis, Raynaud's, SOB with exercise, back surgery, L TKA.   Clinical Impression   Pt is 76 year old female who presents to [facility] with recent dx of UTI and with history of COPD.  Pt currently requires min guard to min assist assist for ADLs due to SOB, decreased strength, decreased endurance for functional tasks and at risk for falls.  Pt would benefit from skilled OT services to increase independence in ADLs, education in energy conservation techniques, pursed lip breathing, training in AE/DME to minimize SOB/falls, and recommendations for home modifications to increase safety and prevent falls. Will continue to assess as pt progresses for HHOT needs.    Follow Up Recommendations  SNF    Equipment Recommendations  Other (comment) (reacher, HH shower head, LH shoe horn, LH sponge)    Recommendations for Other Services       Precautions / Restrictions Precautions Precautions: Fall Restrictions Weight Bearing Restrictions: No      Mobility Bed Mobility Overal bed mobility: Needs Assistance Bed Mobility: Supine to Sit;Sit to Supine     Supine to sit: Min guard;HOB elevated Sit to supine: Min guard      Transfers Overall transfer level: Needs assistance Equipment used: Rolling walker (2 wheeled) Transfers: Sit to/from Stand Sit to Stand: Min guard         General transfer comment: Min guard for safety; increased effort for sit to stand; VC for hand placement to maximize safety    Balance Overall balance assessment: Needs  assistance Sitting-balance support: Feet supported Sitting balance-Leahy Scale: Fair Sitting balance - Comments: fair+   Standing balance support: Bilateral upper extremity supported Standing balance-Leahy Scale: Good                             ADL either performed or assessed with clinical judgement   ADL Overall ADL's : Needs assistance/impaired                                       General ADL Comments: pt generally at min guard level for ADL tasks when bending forward or standing, min assist for LB ADL tasks; would benefit from education/training in AE for LB ADL to maximize safety and functional independence while minimizing risk of SOB.     Vision Baseline Vision/History: No visual deficits Patient Visual Report: No change from baseline Vision Assessment?: No apparent visual deficits     Perception     Praxis      Pertinent Vitals/Pain Pain Assessment: No/denies pain     Hand Dominance Right   Extremity/Trunk Assessment Upper Extremity Assessment Upper Extremity Assessment: Generalized weakness (grossly 4-/5 bilaterally, fair+ grip strength)   Lower Extremity Assessment Lower Extremity Assessment: Generalized weakness   Cervical / Trunk Assessment Cervical / Trunk Assessment: Normal   Communication Communication Communication: No difficulties   Cognition Arousal/Alertness: Awake/alert Behavior During Therapy: WFL for tasks assessed/performed Overall Cognitive Status: Within Functional Limits for  tasks assessed                                     General Comments       Exercises Other Exercises Other Exercises: pt/family educated in pursed lip breathing to support safety and minimize SOB; pt/family verbalized understanding Other Exercises: pt/family educated in basic energy conservation strategies including work simplification, home/routines modifications (seated shower), AE/DME (wedge pillow), and falls  prevention. Pt/family verbalized understanding.   Shoulder Instructions      Home Living Family/patient expects to be discharged to:: Private residence Living Arrangements: Spouse/significant other Available Help at Discharge: Family Type of Home: House Home Access: Stairs to enter Technical brewer of Steps: 2 Entrance Stairs-Rails: None Home Layout: One level     Bathroom Shower/Tub: Walk-in Hydrologist: [place] - single point;Bedside commode;Shower seat;Walker - 2 wheels          Prior Functioning/Environment Level of Independence: Independent        Comments: Pt was ambulating without any AD.  No recent falls since knee surgery 1 year ago.  Brother lives down the road and can assist with meals.  Husband unable to provide physical assist. Driving. No falls in past 12 months.        OT Problem List: Decreased strength;Decreased activity tolerance;Decreased knowledge of use of DME or AE;Impaired balance (sitting and/or standing)      OT Treatment/Interventions: Self-care/ADL training;Therapeutic exercise;Therapeutic activities;Energy conservation;DME and/or AE instruction;Patient/family education    OT Goals(Current goals can be found in the care plan section) Acute Rehab OT Goals Patient Stated Goal: to improve strength OT Goal Formulation: With patient/family Time For Goal Achievement: 05/05/17 Potential to Achieve Goals: Good  OT Frequency: Min 1X/week   Barriers to D/C:            Co-evaluation              AM-PAC PT "6 Clicks" Daily Activity     Outcome Measure Help from another person eating meals?: None Help from another person taking care of personal grooming?: None Help from another person toileting, which includes using toliet, bedpan, or urinal?: A Little Help from another person bathing (including washing, rinsing, drying)?: A Little Help from another person to put on and taking off  regular upper body clothing?: None Help from another person to put on and taking off regular lower body clothing?: A Little 6 Click Score: 21   End of Session Equipment Utilized During Treatment: Gait belt;Rolling walker  Activity Tolerance: Patient tolerated treatment well Patient left: in bed;with call bell/phone within reach;with bed alarm set;with family/visitor present  OT Visit Diagnosis: Other abnormalities of gait and mobility (R26.89);Muscle weakness (generalized) (M62.81)                Time: 1040-1103 OT Time Calculation (min): 23 min Charges:  OT General Charges $OT Visit: 1 Visit OT Evaluation $OT Eval Low Complexity: 1 Low OT Treatments $Self Care/Home Management : 8-22 mins G-Codes: OT G-codes **NOT FOR INPATIENT CLASS** Functional Assessment Tool Used: Clinical judgement;AM-PAC 6 Clicks Daily Activity Functional Limitation: Self care Self Care Current Status (I2979): At least 20 percent but less than 40 percent impaired, limited or restricted Self Care Goal Status (G9211): At least 1 percent but less than 20 percent impaired, limited or restricted   Jeni Salles, MPH, MS, OTR/L ascom [phone] 04/21/17, 11:19  AM

## 2017-04-21 NOTE — Progress Notes (Signed)
[facility] for Heparin drip Indication: chest pain/ACS  Allergies  Allergen Reactions  . Lodine [Etodolac] Hives and Itching  . Methotrexate Derivatives Other (See Comments)    Flu like symptoms  . Norvasc [Amlodipine]   . Pentasa [Mesalamine Er]   . Red Yeast Rice Extract   . Vioxx [Rofecoxib]   . Levaquin [Levofloxacin] Itching, Rash and Other (See Comments)    Muscle pain  . Sulfa Antibiotics Itching and Rash    Patient Measurements: Height: 5' 4"  (162.6 cm) Weight: 122 lb (55.3 kg) IBW/kg (Calculated) : 54.7 Heparin Dosing Weight: 55.3 kg  Vital Signs: Temp: 98.1 F (36.7 C) (10/06 0541) Temp Source: Oral (10/06 0541) BP: 111/49 (10/06 0945) Pulse Rate: 57 (10/06 0945)  Labs:  Recent Labs  04/19/17 1034 04/19/17 1615 04/19/17 2001 04/19/17 2336 04/20/17 0320  04/20/17 1704 04/21/17 0047 04/21/17 0952  HGB 9.9*  --   --   --  8.8*  --   --  8.2*  --   HCT 29.5*  --   --   --  26.4*  --   --  25.0*  --   PLT 325  --   --   --  242  --   --  256  --   APTT  --  78*  --   --   --   --   --   --   --   LABPROT  --  16.4*  --   --   --   --   --   --   --   INR  --  1.33  --   --   --   --   --   --   --   HEPARINUNFRC  --   --   --  <0.10*  --   < > <0.10* 0.28* 0.68  CREATININE 0.73  --   --   --  0.59  --   --   --   --   TROPONINI 15.78*  --  10.18* 15.23* 12.56*  --   --   --   --   < > = values in this interval not displayed.  Estimated Creatinine Clearance: 51.7 mL/min (by C-G formula based on SCr of 0.59 mg/dL).   Medical History: Past Medical History:  Diagnosis Date  . Anemia   . Anxiety   . Arthritis    rheumatoid  . Ataxia   . COPD (chronic obstructive pulmonary disease) ([place])   . Depression   . Diverticulosis   . Excessive falling   . GERD (gastroesophageal reflux disease)   . Hyperlipidemia   . Hypertension   . IBS (irritable bowel syndrome)   . Osteoarthritis   . Psoriasis   . Raynaud's  disease without gangrene   . Raynaud's disease without gangrene   . Shortness of breath dyspnea    with exertion    Medications:  Scheduled:  . aspirin EC  325 mg Oral Daily  . atorvastatin  40 mg Oral q1800  . budesonide  6 mg Oral Daily  . calcium-vitamin D  1 tablet Oral BID  . diltiazem  240 mg Oral Daily  . ferrous sulfate  325 mg Oral Q breakfast  . hydroxychloroquine  200 mg Oral BID  . Influenza vac split quadrivalent PF  0.5 mL Intramuscular Tomorrow-1000  . leflunomide  20 mg Oral Daily  . losartan  100 mg Oral Daily  . metoprolol tartrate  25 mg Oral BID  . multivitamin with minerals  1 tablet Oral Daily  . pantoprazole  40 mg Oral Daily  . potassium chloride  20 mEq Oral BID  . predniSONE  50 mg Oral Q breakfast  . sertraline  50 mg Oral QHS   Infusions:  . sodium chloride 50 mL/hr at 04/21/17 0627  . heparin 1,250 Units/hr (04/21/17 0224)    Assessment: 76 yo F to start Heparin drip for ?NSTEMI. Per Med Rec patient only on ASA at home.  Goal of Therapy:  Heparin level 0.3-0.7 units/ml Monitor platelets by anticoagulation protocol: Yes   Plan:  Will continue heparin drip at 1250 units/hr and recheck a HL at 1800.   Ulice Dash, PharmD Clinical Pharmacist  04/21/17 10:59 AM

## 2017-04-21 NOTE — Progress Notes (Addendum)
[facility] for Heparin drip Indication: chest pain/ACS  Allergies  Allergen Reactions  . Lodine [Etodolac] Hives and Itching  . Methotrexate Derivatives Other (See Comments)    Flu like symptoms  . Norvasc [Amlodipine]   . Pentasa [Mesalamine Er]   . Red Yeast Rice Extract   . Vioxx [Rofecoxib]   . Levaquin [Levofloxacin] Itching, Rash and Other (See Comments)    Muscle pain  . Sulfa Antibiotics Itching and Rash    Patient Measurements: Height: 5' 4"  (162.6 cm) Weight: 122 lb (55.3 kg) IBW/kg (Calculated) : 54.7 Heparin Dosing Weight: 55.3 kg  Vital Signs: BP: 101/53 (10/06 1448) Pulse Rate: 50 (10/06 1448)  Labs:  Recent Labs  04/19/17 1034 04/19/17 1615 04/19/17 2001 04/19/17 2336 04/20/17 0320  04/21/17 0047 04/21/17 0952 04/21/17 1754  HGB 9.9*  --   --   --  8.8*  --  8.2*  --   --   HCT 29.5*  --   --   --  26.4*  --  25.0*  --   --   PLT 325  --   --   --  242  --  256  --   --   APTT  --  78*  --   --   --   --   --   --   --   LABPROT  --  16.4*  --   --   --   --   --   --   --   INR  --  1.33  --   --   --   --   --   --   --   HEPARINUNFRC  --   --   --  <0.10*  --   < > 0.28* 0.68 0.71*  CREATININE 0.73  --   --   --  0.59  --   --   --   --   TROPONINI 15.78*  --  10.18* 15.23* 12.56*  --   --   --   --   < > = values in this interval not displayed.  Estimated Creatinine Clearance: 51.7 mL/min (by C-G formula based on SCr of 0.59 mg/dL).   Medical History: Past Medical History:  Diagnosis Date  . Anemia   . Anxiety   . Arthritis    rheumatoid  . Ataxia   . COPD (chronic obstructive pulmonary disease) ([place])   . Depression   . Diverticulosis   . Excessive falling   . GERD (gastroesophageal reflux disease)   . Hyperlipidemia   . Hypertension   . IBS (irritable bowel syndrome)   . Osteoarthritis   . Psoriasis   . Raynaud's disease without gangrene   . Raynaud's disease without gangrene   .  Shortness of breath dyspnea    with exertion    Medications:  Scheduled:  . aspirin EC  325 mg Oral Daily  . atorvastatin  40 mg Oral q1800  . budesonide  6 mg Oral Daily  . calcium-vitamin D  1 tablet Oral BID  . diltiazem  240 mg Oral Daily  . ferrous sulfate  325 mg Oral Q breakfast  . hydroxychloroquine  200 mg Oral BID  . Influenza vac split quadrivalent PF  0.5 mL Intramuscular Tomorrow-1000  . leflunomide  20 mg Oral Daily  . losartan  100 mg Oral Daily  . multivitamin with minerals  1 tablet Oral Daily  . pantoprazole  40 mg  Oral Daily  . potassium chloride  20 mEq Oral BID  . predniSONE  50 mg Oral Q breakfast  . sertraline  50 mg Oral QHS   Infusions:  . sodium chloride 50 mL/hr at 04/21/17 0627  . heparin 1,250 Units/hr (04/21/17 0224)    Assessment: 76 yo F to start Heparin drip for ?NSTEMI. Per Med Rec patient only on ASA at home.  Goal of Therapy:  Heparin level 0.3-0.7 units/ml Monitor platelets by anticoagulation protocol: Yes   Plan:  Will continue heparin drip at 1250 units/hr and recheck a HL at 1800.   10/6 1754 HL supratherapeutic. Decrease to 1200 units/hr and recheck in 8 hours.  Nathan A. Jordan Hawks, PharmD Clinical Pharmacist  04/21/17 7:00 PM   10/07 0300 heparin level 0.64. Continue current regimen. Recheck heparin level in 8 hours to confirm.  Sim Boast, PharmD, BCPS  04/22/17 4:57 AM

## 2017-04-21 NOTE — Progress Notes (Signed)
[facility] [place] NAME: Melissa [name]    MR#:  [id number]  DATE OF BIRTH:  08-24-1940  SUBJECTIVE:  Still has diarrhea with some improvement. Today she has wheezing and shortness of breath. Afebrile. Yellow sputum.  REVIEW OF SYSTEMS:   Review of Systems  Constitutional: Negative for chills, fever and weight loss.  HENT: Negative for ear discharge, ear pain and nosebleeds.   Eyes: Negative for blurred vision, pain and discharge.  Respiratory: Negative for sputum production, shortness of breath, wheezing and stridor.   Cardiovascular: Negative for chest pain, palpitations, orthopnea and PND.  Gastrointestinal: Positive for diarrhea. Negative for abdominal pain, nausea and vomiting.  Genitourinary: Negative for frequency and urgency.  Musculoskeletal: Negative for back pain and joint pain.  Neurological: Positive for weakness. Negative for sensory change, speech change and focal weakness.  Psychiatric/Behavioral: Negative for depression and hallucinations. The patient is not nervous/anxious.    Tolerating Diet: yes.  DRUG ALLERGIES:   Allergies  Allergen Reactions  . Lodine [Etodolac] Hives and Itching  . Methotrexate Derivatives Other (See Comments)    Flu like symptoms  . Norvasc [Amlodipine]   . Pentasa [Mesalamine Er]   . Red Yeast Rice Extract   . Vioxx [Rofecoxib]   . Levaquin [Levofloxacin] Itching, Rash and Other (See Comments)    Muscle pain  . Sulfa Antibiotics Itching and Rash    VITALS:  Blood pressure (!) 111/49, pulse (!) 57, temperature 98.1 F (36.7 C), temperature source Oral, resp. rate 18, height 5' 4"  (1.626 m), weight 55.3 kg (122 lb), SpO2 97 %.  PHYSICAL EXAMINATION:   Physical Exam  GENERAL:  76 y.o.-year-old patient lying in the bed with no acute distress.  EYES: Pupils equal, round, reactive to light and accommodation. No scleral icterus. Extraocular muscles intact.  HEENT: Head atraumatic,  normocephalic. Oropharynx and nasopharynx clear.  NECK:  Supple, no jugular venous distention. No thyroid enlargement, no tenderness.  LUNGS: Decreased air entry with bilateral wheezing CARDIOVASCULAR: S1, S2 normal. No murmurs, rubs, or gallops.  ABDOMEN: Soft, nontender, nondistended. Bowel sounds present. No organomegaly or mass.  EXTREMITIES: No cyanosis, clubbing or edema b/l.    NEUROLOGIC: Cranial nerves II through XII are intact. No focal Motor or sensory deficits b/l.   PSYCHIATRIC:  patient is alert and oriented x 3.  SKIN: No obvious rash, lesion, or ulcer.   LABORATORY PANEL:  CBC  Recent Labs Lab 04/21/17 0047  WBC 14.6*  HGB 8.2*  HCT 25.0*  PLT 256    Chemistries   Recent Labs Lab 04/19/17 1034 04/20/17 0320  NA 140 143  K 3.2* 3.4*  CL 106 114*  CO2 18* 17*  GLUCOSE 91 67  BUN 14 13  CREATININE 0.73 0.59  CALCIUM 8.8* 8.1*  AST 102*  --   ALT 36  --   ALKPHOS 52  --   BILITOT 1.3*  --    Cardiac Enzymes  Recent Labs Lab 04/20/17 0320  TROPONINI 12.56*   RADIOLOGY:  Dg Chest 2 View  Result Date: 04/21/2017 CLINICAL DATA:  Shortness of breath. EXAM: CHEST  2 VIEW COMPARISON:  [facility] radiograph report from 01/26/2017. Images not available. FINDINGS: Elevation of the right hemidiaphragm appears to be chronic based on the previous report. Coarse lung markings are likely chronic. No focal lung disease. Heart size is within normal limits. The trachea is midline. No large pleural effusions. Osseous structures are unremarkable. IMPRESSION: Evidence for chronic lung  changes.  No acute findings. Electronically Signed   By: Markus Daft M.D.   On: 04/21/2017 10:28   ASSESSMENT AND PLAN:   Melissa [name]  is a 76 y.o. female with a known history of Anxiety, COPD, depression, Diverticulosis, HLD, IBS- had colonoscopy and Endoscopy by Dr. Tiffany Kocher done 2-3 months ago- no abnormalities found. Had UTI , came to ER last week- given keflex- and also had  diarrhea- after checking GI panel- sent home from ER Cont to have abd pain, Nausea, not eating well, chills, diarrhea, so brought to ER.  * Acute NSTEMI   Seen By cardiology Dr Ubaldo Glassing. Cardiac catheterization on Monday -Cont IV heparin drip, Echo - echo with ejection fraction of 45% with septal hypokinesis  -ASA, metoprolol.  * Recent  ESBL uti Finished antibiotics. Repeat urinalysis was normal. Antibiotics stopped    * EPEC  On Abx, follow with ID.  Recent Colonoscopy done,   As per her Entocort given and tapered Off after one month.   If any questions, may speak to GI. ( Dr.Elliot-  primary)  * Hyperlipidemia   Cont Pravastatin.  * COPD exacerbation. Start steroids. Nebulizers. Order incentive spirometer. Oxygen as needed. Chest x-ray ordered and shows chronic changes. Nothing acute.  D/w daughter  Case discussed with Care Management/Social Worker. Management plans discussed with the patient, family and they are in agreement.  CODE STATUS: Full  DVT Prophylaxis: heparin gtt  TOTAL TIME TAKING CARE OF THIS PATIENT: 30 minutes.   POSSIBLE D/C IN 1-2 DAYS, DEPENDING ON CLINICAL CONDITION.  Note: This dictation was prepared with Dragon dictation along with smaller phrase technology. Any transcriptional errors that result from this process are unintentional.  Hillary Bow R M.D on 04/21/2017 at 1:18 PM  Between 7am to 6pm - Pager - [phone]  After 6pm go to [email or web address] - password [email or web address] [facility]  Office  [phone]  CC: Primary care physician; [name], MDPatient ID: Melissa [name], female   DOB: 1941/03/12, 76 y.o.   MRN: [id number]

## 2017-04-21 NOTE — Progress Notes (Signed)
       [place] [facility]  SUBJECTIVE: Denies chest pain.    Vitals:   04/20/17 0916 04/20/17 1216 04/20/17 1942 04/21/17 0541  BP: (!) 112/58 (!) 106/56 (!) 109/59 (!) 100/48  Pulse: 61 64 (!) 59 68  Resp:   18 18  Temp:  (!) 97.5 F (36.4 C) 98 F (36.7 C) 98.1 F (36.7 C)  TempSrc:  Oral Oral Oral  SpO2: 98% 94% 96% 96%  Weight:      Height:        Intake/Output Summary (Last 24 hours) at 04/21/17 0925 Last data filed at 04/21/17 0000  Gross per 24 hour  Intake                0 ml  Output              500 ml  Net             -500 ml    LABS: Basic Metabolic Panel:  Recent Labs  04/19/17 1034 04/20/17 0320  NA 140 143  K 3.2* 3.4*  CL 106 114*  CO2 18* 17*  GLUCOSE 91 67  BUN 14 13  CREATININE 0.73 0.59  CALCIUM 8.8* 8.1*   Liver Function Tests:  Recent Labs  04/19/17 1034  AST 102*  ALT 36  ALKPHOS 52  BILITOT 1.3*  PROT 7.3  ALBUMIN 3.4*   No results for input(s): LIPASE, AMYLASE in the last 72 hours. CBC:  Recent Labs  04/20/17 0320 04/21/17 0047  WBC 14.0* 14.6*  HGB 8.8* 8.2*  HCT 26.4* 25.0*  MCV 95.3 93.4  PLT 242 256   Cardiac Enzymes:  Recent Labs  04/19/17 2001 04/19/17 2336 04/20/17 0320  TROPONINI 10.18* 15.23* 12.56*   BNP: Invalid input(s): POCBNP D-Dimer: No results for input(s): DDIMER in the last 72 hours. Hemoglobin A1C: No results for input(s): HGBA1C in the last 72 hours. Fasting Lipid Panel:  Recent Labs  04/19/17 1034  CHOL 156  HDL 24*  LDLCALC 84  TRIG 239*  CHOLHDL 6.5   Thyroid Function Tests: No results for input(s): TSH, T4TOTAL, T3FREE, THYROIDAB in the last 72 hours.  Invalid input(s): FREET3 Anemia Panel: No results for input(s): VITAMINB12, FOLATE, FERRITIN, TIBC, IRON, RETICCTPCT in the last 72 hours.   Physical Exam: Blood pressure (!) 100/48, pulse 68, temperature 98.1 F (36.7 C), temperature source Oral, resp. rate 18, height 5' 4"   (1.626 m), weight 55.3 kg (122 lb), SpO2 96 %.   Wt Readings from Last 1 Encounters:  04/19/17 55.3 kg (122 lb)     General appearance: alert and cooperative Head: Normocephalic, without obvious abnormality, atraumatic Resp: clear to auscultation bilaterally Chest wall: no tenderness Cardio: regular rate and rhythm GI: soft, non-tender; bowel sounds normal; no masses,  no organomegaly Extremities: extremities normal, atraumatic, no cyanosis or edema  TELEMETRY: Reviewed telemetry pt in nsr:  ASSESSMENT AND PLAN:  Principal Problem:   Colitis-improving.  Active Problems:   UTI due to extended-spectrum beta lactamase (ESBL) producing Escherichia coli  NSTEMI-elevated troponin likely due to nstemi. Echo shows ef of 45-50% with septal hypokinesis consistent with ischemic changes. Will consider left heart cath on Monday to guide further therapy. Discussed with patient and daughter.  Continue with losartan, heparin, asa, metoprolol. Futher recs after cath.   Teodoro Spray, MD, [facility] 04/21/2017 9:25 AM

## 2017-04-22 LAB — URINE CULTURE: Culture: NO GROWTH

## 2017-04-22 LAB — CBC WITH DIFFERENTIAL/PLATELET
Basophils Absolute: 0 10*3/uL (ref 0–0.1)
Basophils Relative: 0 %
EOS ABS: 0 10*3/uL (ref 0–0.7)
Eosinophils Relative: 0 %
HEMATOCRIT: 24.7 % — AB (ref 35.0–47.0)
HEMOGLOBIN: 8.2 g/dL — AB (ref 12.0–16.0)
LYMPHS ABS: 0.2 10*3/uL — AB (ref 1.0–3.6)
Lymphocytes Relative: 2 %
MCH: 31.4 pg (ref 26.0–34.0)
MCHC: 33.2 g/dL (ref 32.0–36.0)
MCV: 94.7 fL (ref 80.0–100.0)
MONO ABS: 0.8 10*3/uL (ref 0.2–0.9)
MONOS PCT: 7 %
NEUTROS PCT: 91 %
Neutro Abs: 10.5 10*3/uL — ABNORMAL HIGH (ref 1.4–6.5)
Platelets: 228 10*3/uL (ref 150–440)
RBC: 2.61 MIL/uL — ABNORMAL LOW (ref 3.80–5.20)
RDW: 14.9 % — AB (ref 11.5–14.5)
WBC: 11.5 10*3/uL — ABNORMAL HIGH (ref 3.6–11.0)

## 2017-04-22 LAB — HEPARIN LEVEL (UNFRACTIONATED)
Heparin Unfractionated: 0.64 IU/mL (ref 0.30–0.70)
Heparin Unfractionated: 0.51 IU/mL (ref 0.30–0.70)

## 2017-04-22 LAB — BASIC METABOLIC PANEL
Anion gap: 8 (ref 5–15)
BUN: 20 mg/dL (ref 6–20)
CHLORIDE: 116 mmol/L — AB (ref 101–111)
CO2: 19 mmol/L — AB (ref 22–32)
CREATININE: 0.63 mg/dL (ref 0.44–1.00)
Calcium: 8.9 mg/dL (ref 8.9–10.3)
GFR calc Af Amer: >60 mL/min (ref >60)
GFR calc non Af Amer: >60 mL/min (ref >60)
Glucose, Bld: 245 mg/dL — ABNORMAL HIGH (ref 65–99)
Potassium: 4.5 mmol/L (ref 3.5–5.1)
SODIUM: 143 mmol/L (ref 135–145)

## 2017-04-22 MED ORDER — NYSTATIN 100000 UNIT/ML MT SUSP
5.0000 mL | Freq: Four times a day (QID) | OROMUCOSAL | Status: DC
  Administered 2017-04-22 – 2017-04-25 (×9): 500000 [IU] via ORAL
  Filled 2017-04-22 (×10): qty 5

## 2017-04-22 MED ORDER — ASPIRIN 81 MG PO CHEW
81.0000 mg | CHEWABLE_TABLET | ORAL | Status: AC
  Administered 2017-04-23: 81 mg via ORAL
  Filled 2017-04-22: qty 1

## 2017-04-22 NOTE — Progress Notes (Signed)
[place] for Heparin drip Indication: chest pain/ACS  Allergies  Allergen Reactions  . Lodine [Etodolac] Hives and Itching  . Methotrexate Derivatives Other (See Comments)    Flu like symptoms  . Norvasc [Amlodipine]   . Pentasa [Mesalamine Er]   . Red Yeast Rice Extract   . Vioxx [Rofecoxib]   . Levaquin [Levofloxacin] Itching, Rash and Other (See Comments)    Muscle pain  . Sulfa Antibiotics Itching and Rash    Patient Measurements: Height: 5' 4"  (162.6 cm) Weight: 122 lb (55.3 kg) IBW/kg (Calculated) : 54.7 Heparin Dosing Weight: 55.3 kg  Vital Signs: Temp: 97.4 F (36.3 C) (10/07 0859) Temp Source: Oral (10/07 0859) BP: 123/55 (10/07 0859) Pulse Rate: 59 (10/07 0859)  Labs:  Recent Labs  04/19/17 1615 04/19/17 2001 04/19/17 2336  04/20/17 0320  04/21/17 0047  04/21/17 1754 04/22/17 0322 04/22/17 1041  HGB  --   --   --   < > 8.8*  --  8.2*  --   --  8.2*  --   HCT  --   --   --   --  26.4*  --  25.0*  --   --  24.7*  --   PLT  --   --   --   --  242  --  256  --   --  228  --   APTT 78*  --   --   --   --   --   --   --   --   --   --   LABPROT 16.4*  --   --   --   --   --   --   --   --   --   --   INR 1.33  --   --   --   --   --   --   --   --   --   --   HEPARINUNFRC  --   --  <0.10*  --   --   < > 0.28*  < > 0.71* 0.64 0.51  CREATININE  --   --   --   --  0.59  --   --   --   --  0.63  --   TROPONINI  --  10.18* 15.23*  --  12.56*  --   --   --   --   --   --   < > = values in this interval not displayed.  Estimated Creatinine Clearance: 51.7 mL/min (by C-G formula based on SCr of 0.63 mg/dL).   Medical History: Past Medical History:  Diagnosis Date  . Anemia   . Anxiety   . Arthritis    rheumatoid  . Ataxia   . COPD (chronic obstructive pulmonary disease) ([place])   . Depression   . Diverticulosis   . Excessive falling   . GERD (gastroesophageal reflux disease)   . Hyperlipidemia   . Hypertension    . IBS (irritable bowel syndrome)   . Osteoarthritis   . Psoriasis   . Raynaud's disease without gangrene   . Raynaud's disease without gangrene   . Shortness of breath dyspnea    with exertion    Medications:  Scheduled:  . aspirin EC  325 mg Oral Daily  . atorvastatin  40 mg Oral q1800  . budesonide  6 mg Oral Daily  . calcium-vitamin D  1 tablet Oral BID  .  diltiazem  240 mg Oral Daily  . ferrous sulfate  325 mg Oral Q breakfast  . hydroxychloroquine  200 mg Oral BID  . Influenza vac split quadrivalent PF  0.5 mL Intramuscular Tomorrow-1000  . leflunomide  20 mg Oral Daily  . losartan  100 mg Oral Daily  . multivitamin with minerals  1 tablet Oral Daily  . pantoprazole  40 mg Oral Daily  . potassium chloride  20 mEq Oral BID  . predniSONE  50 mg Oral Q breakfast  . sertraline  50 mg Oral QHS   Infusions:  . heparin 1,200 Units/hr (04/22/17 1017)    Assessment: 76 yo F to start Heparin drip for ?NSTEMI. Per Med Rec patient only on ASA at home.  Goal of Therapy:  Heparin level 0.3-0.7 units/ml Monitor platelets by anticoagulation protocol: Yes   Plan:  Will continue heparin drip at 1200 units/hr and f/u AM labs.   Ulice Dash, PharmD Clinical Pharmacist

## 2017-04-22 NOTE — Progress Notes (Signed)
Pt stated she has sores in her mouth that are painful when eating. Upon assessment there are small sores on the inside of her cheeks and white patchy areas on the mucous membranes/ tongue that appear to resemble thrush. Paged Dr. Darvin Neighbours and received order for nystatin.

## 2017-04-22 NOTE — Progress Notes (Signed)
[facility] [place] NAME: Pristine Gladhill    MR#:  [id number]  DATE OF BIRTH:  08-Feb-1941  SUBJECTIVE:   One episode of loose stool overnight. SOB better. Still has cough  REVIEW OF SYSTEMS:   Review of Systems  Constitutional: Negative for chills, fever and weight loss.  HENT: Negative for ear discharge, ear pain and nosebleeds.   Eyes: Negative for blurred vision, pain and discharge.  Respiratory: Negative for sputum production, shortness of breath, wheezing and stridor.   Cardiovascular: Negative for chest pain, palpitations, orthopnea and PND.  Gastrointestinal: Positive for diarrhea. Negative for abdominal pain, nausea and vomiting.  Genitourinary: Negative for frequency and urgency.  Musculoskeletal: Negative for back pain and joint pain.  Neurological: Positive for weakness. Negative for sensory change, speech change and focal weakness.  Psychiatric/Behavioral: Negative for depression and hallucinations. The patient is not nervous/anxious.    Tolerating Diet: yes.  DRUG ALLERGIES:   Allergies  Allergen Reactions  . Lodine [Etodolac] Hives and Itching  . Methotrexate Derivatives Other (See Comments)    Flu like symptoms  . Norvasc [Amlodipine]   . Pentasa [Mesalamine Er]   . Red Yeast Rice Extract   . Vioxx [Rofecoxib]   . Levaquin [Levofloxacin] Itching, Rash and Other (See Comments)    Muscle pain  . Sulfa Antibiotics Itching and Rash    VITALS:  Blood pressure (!) 123/55, pulse (!) 59, temperature (!) 97.4 F (36.3 C), temperature source Oral, resp. rate 16, height 5' 4"  (1.626 m), weight 55.3 kg (122 lb), SpO2 100 %.  PHYSICAL EXAMINATION:   Physical Exam  GENERAL:  76 y.o.-year-old patient lying in the bed with no acute distress.  EYES: Pupils equal, round, reactive to light and accommodation. No scleral icterus. Extraocular muscles intact.  HEENT: Head atraumatic, normocephalic. Oropharynx and nasopharynx  clear.  NECK:  Supple, no jugular venous distention. No thyroid enlargement, no tenderness.  LUNGS: Decreased air entry with bilateral wheezing CARDIOVASCULAR: S1, S2 normal. No murmurs, rubs, or gallops.  ABDOMEN: Soft, nontender, nondistended. Bowel sounds present. No organomegaly or mass.  EXTREMITIES: No cyanosis, clubbing or edema b/l.    NEUROLOGIC: Cranial nerves II through XII are intact. No focal Motor or sensory deficits b/l.   PSYCHIATRIC:  patient is alert and oriented x 3.  SKIN: No obvious rash, lesion, or ulcer.   LABORATORY PANEL:  CBC  Recent Labs Lab 04/22/17 0322  WBC 11.5*  HGB 8.2*  HCT 24.7*  PLT 228    Chemistries   Recent Labs Lab 04/19/17 1034  04/22/17 0322  NA 140  < > 143  K 3.2*  < > 4.5  CL 106  < > 116*  CO2 18*  < > 19*  GLUCOSE 91  < > 245*  BUN 14  < > 20  CREATININE 0.73  < > 0.63  CALCIUM 8.8*  < > 8.9  AST 102*  --   --   ALT 36  --   --   ALKPHOS 52  --   --   BILITOT 1.3*  --   --   < > = values in this interval not displayed. Cardiac Enzymes  Recent Labs Lab 04/20/17 0320  TROPONINI 12.56*   RADIOLOGY:  Dg Chest 2 View  Result Date: 04/21/2017 CLINICAL DATA:  Shortness of breath. EXAM: CHEST  2 VIEW COMPARISON:  [facility] radiograph report from 01/26/2017. Images not available. FINDINGS: Elevation of the right hemidiaphragm appears  to be chronic based on the previous report. Coarse lung markings are likely chronic. No focal lung disease. Heart size is within normal limits. The trachea is midline. No large pleural effusions. Osseous structures are unremarkable. IMPRESSION: Evidence for chronic lung changes.  No acute findings. Electronically Signed   By: Markus Daft M.D.   On: 04/21/2017 10:28   ASSESSMENT AND PLAN:   Noel Henandez  is a 76 y.o. female with a known history of Anxiety, COPD, depression, Diverticulosis, HLD, IBS- had colonoscopy and Endoscopy by Dr. Tiffany Kocher done 2-3 months ago- no abnormalities found. Had  UTI , came to ER last week- given keflex- and also had diarrhea- after checking GI panel- sent home from ER Cont to have abd pain, Nausea, not eating well, chills, diarrhea, so brought to ER.  * Acute NSTEMI   Seen By cardiology Dr Ubaldo Glassing. Cardiac catheterization on Monday -Cont IV heparin drip, Echo - echo with ejection fraction of 45% with septal hypokinesis  -ASA, metoprolol.  * Recent  ESBL uti Finished antibiotics. Repeat urinalysis was normal. Antibiotics stopped here    * EPEC  On Abx, follow with ID.  Recent Colonoscopy done,   As per her Entocort given and tapered Off after one month.   If any questions, may speak to GI. ( Dr.Elliot-  primary)  * Hyperlipidemia   Cont Pravastatin.  * COPD exacerbation. Started steroids. Nebulizers. Order incentive spirometer. Oxygen as needed. Chest x-ray  shows chronic changes. Nothing acute.  D/w daughter at bedside  Case discussed with Care Management/Social Worker. Management plans discussed with the patient, family and they are in agreement.  CODE STATUS: Full  DVT Prophylaxis: heparin gtt  TOTAL TIME TAKING CARE OF THIS PATIENT: 30 minutes.   POSSIBLE D/C IN 1-2 DAYS, DEPENDING ON CLINICAL CONDITION.  Note: This dictation was prepared with Dragon dictation along with smaller phrase technology. Any transcriptional errors that result from this process are unintentional.  Hillary Bow R M.D on 04/22/2017 at 12:40 PM  Between 7am to 6pm - Pager - [phone]  After 6pm go to [email or web address] - password [email or web address] [facility]  Office  [phone]  CC: Primary care physician; [name], MDPatient ID: [name], female   DOB: April 01, 1941, 76 y.o.   MRN: [id number]

## 2017-04-22 NOTE — Clinical Social Work Note (Signed)
Clinical Social Work Assessment  Patient Details  Name: Melissa [name] MRN: [id number] Date of Birth: 03-27-1941  Date of referral:  04/22/17               Reason for consult:  Facility Placement                Permission sought to share information with:  Chartered certified accountant granted to share information::  Yes, Verbal Permission Granted  Name::        Agency::     Relationship::     Contact Information:     Housing/Transportation Living arrangements for the past 2 months:  [place] of Information:  Patient, Medical Team, Adult Children Patient Interpreter Needed:  None Criminal Activity/Legal Involvement Pertinent to Current Situation/Hospitalization:  No - Comment as needed Significant Relationships:  Adult Children, Church, [employer], Other Family Members, Spouse Lives with:  Spouse Do you feel safe going back to the place where you live?  Yes Need for family participation in patient care:  No (Coment)  Care giving concerns:  PT recommendation for STR   Social Worker assessment / plan:  CSW met with the patient and her family at bedside to discuss discharge planning. The patient was alert and oriented X 4 and verbalized permission to conduct the STR referral. The CSW explained the referral process and provided a list of the facilities in the area. The patient is independent in all ADLs and IADLs at baseline and lives with her husband. The patient has support from her adult children and their families as well as her church family and community support.  The CSW has sent the referral. The CSW will provide bed offers as they are available. Discharge in the next 1-2 days.  Employment status:  Retired Nurse, adult PT Recommendations:  [place] / Referral to community resources:  [place]  Patient/Family's Response to care:  The patient was pleasant and thanked the  CSW.  Patient/Family's Understanding of and Emotional Response to Diagnosis, Current Treatment, and Prognosis:  The patient understands and agrees with the level of care recommended by PT.  Emotional Assessment Appearance:  Appears stated age Attitude/Demeanor/Rapport:  Other (Pleasant) Affect (typically observed):  Pleasant, Accepting, Appropriate Orientation:  Oriented to Self, Oriented to Place, Oriented to  Time, Oriented to Situation Alcohol / Substance use:  Never Used Psych involvement (Current and /or in the community):  No (Comment)  Discharge Needs  Concerns to be addressed:  Care Coordination, Discharge Planning Concerns Readmission within the last 30 days:  No Current discharge risk:  None Barriers to Discharge:  Continued Medical Work up   [employer], LCSW 04/22/2017, 2:38 PM

## 2017-04-22 NOTE — Progress Notes (Signed)
       [place] [facility]  SUBJECTIVE: Less diarrhea and no chest pain.   Vitals:   04/21/17 1448 04/21/17 1924 04/21/17 2018 04/22/17 0438  BP: (!) 101/53 (!) 107/51  (!) 115/44  Pulse: (!) 50 (!) 101  91  Resp: 12 18  16   Temp:  97.6 F (36.4 C)  (!) 97.5 F (36.4 C)  TempSrc:  Oral  Oral  SpO2: 100% 91% 95% 94%  Weight:      Height:        Intake/Output Summary (Last 24 hours) at 04/22/17 0820 Last data filed at 04/22/17 0200  Gross per 24 hour  Intake                0 ml  Output              150 ml  Net             -150 ml    LABS: Basic Metabolic Panel:  Recent Labs  04/20/17 0320 04/22/17 0322  NA 143 143  K 3.4* 4.5  CL 114* 116*  CO2 17* 19*  GLUCOSE 67 245*  BUN 13 20  CREATININE 0.59 0.63  CALCIUM 8.1* 8.9   Liver Function Tests:  Recent Labs  04/19/17 1034  AST 102*  ALT 36  ALKPHOS 52  BILITOT 1.3*  PROT 7.3  ALBUMIN 3.4*   No results for input(s): LIPASE, AMYLASE in the last 72 hours. CBC:  Recent Labs  04/21/17 0047 04/22/17 0322  WBC 14.6* 11.5*  NEUTROABS  --  10.5*  HGB 8.2* 8.2*  HCT 25.0* 24.7*  MCV 93.4 94.7  PLT 256 228   Cardiac Enzymes:  Recent Labs  04/19/17 2001 04/19/17 2336 04/20/17 0320  TROPONINI 10.18* 15.23* 12.56*   BNP: Invalid input(s): POCBNP D-Dimer: No results for input(s): DDIMER in the last 72 hours. Hemoglobin A1C: No results for input(s): HGBA1C in the last 72 hours. Fasting Lipid Panel:  Recent Labs  04/19/17 1034  CHOL 156  HDL 24*  LDLCALC 84  TRIG 239*  CHOLHDL 6.5   Thyroid Function Tests: No results for input(s): TSH, T4TOTAL, T3FREE, THYROIDAB in the last 72 hours.  Invalid input(s): FREET3 Anemia Panel: No results for input(s): VITAMINB12, FOLATE, FERRITIN, TIBC, IRON, RETICCTPCT in the last 72 hours.   Physical Exam: Blood pressure (!) 115/44, pulse 91, temperature (!) 97.5 F (36.4 C), temperature source Oral, resp. rate 16,  height 5' 4"  (1.626 m), weight 55.3 kg (122 lb), SpO2 94 %.   Wt Readings from Last 1 Encounters:  04/19/17 55.3 kg (122 lb)     General appearance: alert and cooperative Resp: clear to auscultation bilaterally Cardio: regular rate and rhythm GI: soft, non-tender; bowel sounds normal; no masses,  no organomegaly Extremities: extremities normal, atraumatic, no cyanosis or edema Neurologic: Grossly normal  TELEMETRY: Reviewed telemetry pt in normal sinus rhythm with occasional dropped beat.:  ASSESSMENT AND PLAN:  Principal Problem:   Colitis-diarrhea has improved. Active Problems:   UTI due to extended-spectrum beta lactamase (ESBL) producing Escherichia coli  Non-ST elevation myocardial infarction-currently stable on heparin. Taken off metoprolol due to bradycardia. Continue with statin and aspirin. Serum electrolytes stable Plan left heart cath the morning to evaluate coronary anatomy if renal function and diarrhea remains stable.    Teodoro Spray, MD, [facility] 04/22/2017 8:20 AM

## 2017-04-23 ENCOUNTER — Encounter: Payer: Self-pay | Admitting: Cardiology

## 2017-04-23 ENCOUNTER — Encounter
Admission: EM | Disposition: A | Discharge status: Skilled Nursing Facility | Payer: Self-pay | Source: Home / Self Care | Attending: Internal Medicine

## 2017-04-23 HISTORY — PX: LEFT HEART CATH AND CORONARY ANGIOGRAPHY: CATH118249

## 2017-04-23 LAB — CBC
HCT: 25.6 % — ABNORMAL LOW (ref 35.0–47.0)
Hemoglobin: 8.4 g/dL — ABNORMAL LOW (ref 12.0–16.0)
MCH: 31.1 pg (ref 26.0–34.0)
MCHC: 32.8 g/dL (ref 32.0–36.0)
MCV: 94.8 fL (ref 80.0–100.0)
PLATELETS: 253 10*3/uL (ref 150–440)
RBC: 2.7 MIL/uL — AB (ref 3.80–5.20)
RDW: 14.7 % — ABNORMAL HIGH (ref 11.5–14.5)
WBC: 16.9 10*3/uL — ABNORMAL HIGH (ref 3.6–11.0)

## 2017-04-23 LAB — GLUCOSE, CAPILLARY: Glucose-Capillary: 131 mg/dL — ABNORMAL HIGH (ref 65–99)

## 2017-04-23 LAB — HEPARIN LEVEL (UNFRACTIONATED): HEPARIN UNFRACTIONATED: 0.44 [IU]/mL (ref 0.30–0.70)

## 2017-04-23 LAB — HEMOGLOBIN A1C
Hgb A1c MFr Bld: 6.3 % — ABNORMAL HIGH (ref 4.8–5.6)
Mean Plasma Glucose: 134.11 mg/dL

## 2017-04-23 LAB — POCT ACTIVATED CLOTTING TIME: ACTIVATED CLOTTING TIME: 301 s

## 2017-04-23 SURGERY — LEFT HEART CATH AND CORONARY ANGIOGRAPHY
Anesthesia: Moderate Sedation

## 2017-04-23 MED ORDER — SODIUM CHLORIDE 0.9% FLUSH: 3.0000 mL | INTRAVENOUS | Status: DC | PRN

## 2017-04-23 MED ORDER — FUROSEMIDE 10 MG/ML IJ SOLN
40.0000 mg | Freq: Once | INTRAMUSCULAR | Status: AC
  Administered 2017-04-23: 40 mg via INTRAVENOUS
  Filled 2017-04-23: qty 4

## 2017-04-23 MED ORDER — HYDROCOD POLST-CPM POLST ER 10-8 MG/5ML PO SUER
5.0000 mL | Freq: Two times a day (BID) | ORAL | Status: DC | PRN
  Administered 2017-04-23 (×2): 5 mL via ORAL
  Filled 2017-04-23 (×2): qty 5

## 2017-04-23 MED ORDER — SODIUM CHLORIDE 0.9 % WEIGHT BASED INFUSION
3.0000 mL/kg/h | INTRAVENOUS | Status: DC
  Administered 2017-04-23: 3 mL/kg/h via INTRAVENOUS

## 2017-04-23 MED ORDER — IOPAMIDOL (ISOVUE-300) INJECTION 61%
INTRAVENOUS | Status: DC | PRN
  Administered 2017-04-23: 30 mL via INTRA_ARTERIAL

## 2017-04-23 MED ORDER — IPRATROPIUM-ALBUTEROL 0.5-2.5 (3) MG/3ML IN SOLN
RESPIRATORY_TRACT | Status: AC
  Administered 2017-04-23: 3 mL via RESPIRATORY_TRACT
  Filled 2017-04-23: qty 3

## 2017-04-23 MED ORDER — APIXABAN 5 MG PO TABS
5.0000 mg | ORAL_TABLET | Freq: Two times a day (BID) | ORAL | Status: DC
  Administered 2017-04-23 – 2017-04-25 (×5): 5 mg via ORAL
  Filled 2017-04-23 (×5): qty 1

## 2017-04-23 MED ORDER — SODIUM CHLORIDE 0.9 % IV SOLN: 250.0000 mL | INTRAVENOUS | Status: DC | PRN

## 2017-04-23 MED ORDER — SODIUM CHLORIDE 0.9% FLUSH: 3.0000 mL | Freq: Two times a day (BID) | INTRAVENOUS | Status: DC

## 2017-04-23 MED ORDER — SODIUM CHLORIDE 0.9% FLUSH
3.0000 mL | Freq: Two times a day (BID) | INTRAVENOUS | Status: DC
Start: 2017-04-23 — End: 2017-04-25
  Administered 2017-04-23 – 2017-04-25 (×5): 3 mL via INTRAVENOUS

## 2017-04-23 MED ORDER — SODIUM CHLORIDE 0.9 % WEIGHT BASED INFUSION: 1.0000 mL/kg/h | INTRAVENOUS | Status: DC

## 2017-04-23 MED ORDER — SODIUM CHLORIDE 0.9 % IV SOLN: INTRAVENOUS | Status: DC

## 2017-04-23 MED ORDER — HYDROCOD POLST-CPM POLST ER 10-8 MG/5ML PO SUER
ORAL | Status: AC
Start: 2017-04-23 — End: 2017-04-23
  Administered 2017-04-23: 5 mL via ORAL
  Filled 2017-04-23: qty 5

## 2017-04-23 MED ORDER — FUROSEMIDE 10 MG/ML IJ SOLN
INTRAMUSCULAR | Status: DC | PRN
  Administered 2017-04-23: 20 mg via INTRAVENOUS

## 2017-04-23 MED ORDER — FUROSEMIDE 10 MG/ML IJ SOLN
INTRAMUSCULAR | Status: AC
  Filled 2017-04-23: qty 4

## 2017-04-23 MED ORDER — ONDANSETRON HCL 4 MG/2ML IJ SOLN: 4.0000 mg | Freq: Four times a day (QID) | INTRAMUSCULAR | Status: DC | PRN

## 2017-04-23 MED ORDER — ACETAMINOPHEN 325 MG PO TABS: 650.0000 mg | ORAL_TABLET | ORAL | Status: DC | PRN

## 2017-04-23 SURGICAL SUPPLY — 12 items
CATH 5FR JL4 DIAGNOSTIC (CATHETERS) IMPLANT
CATH INFINITI 5 FR 3DRC (CATHETERS) ×3 IMPLANT
CATH INFINITI 5FR ANG PIGTAIL (CATHETERS) ×3 IMPLANT
CATH INFINITI 5FR JL4 (CATHETERS) ×3 IMPLANT
CATH INFINITI JR4 5F (CATHETERS) ×3 IMPLANT
DEVICE CLOSURE MYNXGRIP 5F (Vascular Products) ×3 IMPLANT
DEVICE SAFEGUARD 24CM (GAUZE/BANDAGES/DRESSINGS) ×3 IMPLANT
KIT MANI 3VAL PERCEP (MISCELLANEOUS) ×3 IMPLANT
NEEDLE PERC 18GX7CM (NEEDLE) ×3 IMPLANT
PACK CARDIAC CATH (CUSTOM PROCEDURE TRAY) ×3 IMPLANT
SHEATH AVANTI 5FR X 11CM (SHEATH) ×3 IMPLANT
WIRE EMERALD 3MM-J .035X150CM (WIRE) ×3 IMPLANT

## 2017-04-23 NOTE — Progress Notes (Signed)
[place] for apixaban Indication: atrial fibrillation  Allergies  Allergen Reactions  . Lodine [Etodolac] Hives and Itching  . Methotrexate Derivatives Other (See Comments)    Flu like symptoms  . Norvasc [Amlodipine]   . Pentasa [Mesalamine Er]   . Red Yeast Rice Extract   . Vioxx [Rofecoxib]   . Levaquin [Levofloxacin] Itching, Rash and Other (See Comments)    Muscle pain  . Sulfa Antibiotics Itching and Rash    Patient Measurements: Height: 5' 4"  (162.6 cm) Weight: 132 lb (59.9 kg) IBW/kg (Calculated) : 54.7  Vital Signs: Temp: 98.2 F (36.8 C) (10/08 1104) Temp Source: Oral (10/08 1104) BP: 122/79 (10/08 1119) Pulse Rate: 69 (10/08 1119)  Labs:  Recent Labs  04/21/17 0047  04/22/17 0322 04/22/17 1041 04/23/17 0613  HGB 8.2*  --  8.2*  --  8.4*  HCT 25.0*  --  24.7*  --  25.6*  PLT 256  --  228  --  253  HEPARINUNFRC 0.28*  < > 0.64 0.51 0.44  CREATININE  --   --  0.63  --   --   < > = values in this interval not displayed.  Estimated Creatinine Clearance: 51.7 mL/min (by C-G formula based on SCr of 0.63 mg/dL).   Medical History: Past Medical History:  Diagnosis Date  . Anemia   . Anxiety   . Arthritis    rheumatoid  . Ataxia   . COPD (chronic obstructive pulmonary disease) ([place])   . Depression   . Diverticulosis   . Excessive falling   . GERD (gastroesophageal reflux disease)   . Hyperlipidemia   . Hypertension   . IBS (irritable bowel syndrome)   . Osteoarthritis   . Psoriasis   . Raynaud's disease without gangrene   . Raynaud's disease without gangrene   . Shortness of breath dyspnea    with exertion   Assessment: 76 y/o F admitted with colitis and now s/p cardiac cath for NSTEMI with PAF noted and CHADSVASC > or = 4. Patient is to begin apixaban.   Plan:  Agree with apixban 5 mg bid as ordered per cardiology.   Ulice Dash D 04/23/2017,1:49 PM

## 2017-04-23 NOTE — Progress Notes (Signed)
[place] [facility]  SUBJECTIVE: cough and weakness.    Vitals:   04/22/17 1943 04/22/17 2110 04/23/17 0333 04/23/17 0704  BP: 123/61  133/67 120/60  Pulse: 73  74 66  Resp: 17  19 (!) 22  Temp: (!) 97.5 F (36.4 C)  97.6 F (36.4 C) 97.9 F (36.6 C)  TempSrc: Oral  Oral Oral  SpO2: 98% 96% 96% 97%  Weight:   59.9 kg (132 lb 1.6 oz) 59.9 kg (132 lb)  Height:    5' 4"  (1.626 m)    Intake/Output Summary (Last 24 hours) at 04/23/17 1023 Last data filed at 04/23/17 0600  Gross per 24 hour  Intake              144 ml  Output              200 ml  Net              -56 ml    LABS: Basic Metabolic Panel:  Recent Labs  04/22/17 0322  NA 143  K 4.5  CL 116*  CO2 19*  GLUCOSE 245*  BUN 20  CREATININE 0.63  CALCIUM 8.9   Liver Function Tests: No results for input(s): AST, ALT, ALKPHOS, BILITOT, PROT, ALBUMIN in the last 72 hours. No results for input(s): LIPASE, AMYLASE in the last 72 hours. CBC:  Recent Labs  04/22/17 0322 04/23/17 0613  WBC 11.5* 16.9*  NEUTROABS 10.5*  --   HGB 8.2* 8.4*  HCT 24.7* 25.6*  MCV 94.7 94.8  PLT 228 253   Cardiac Enzymes: No results for input(s): CKTOTAL, CKMB, CKMBINDEX, TROPONINI in the last 72 hours. BNP: Invalid input(s): POCBNP D-Dimer: No results for input(s): DDIMER in the last 72 hours. Hemoglobin A1C: No results for input(s): HGBA1C in the last 72 hours. Fasting Lipid Panel: No results for input(s): CHOL, HDL, LDLCALC, TRIG, CHOLHDL, LDLDIRECT in the last 72 hours. Thyroid Function Tests: No results for input(s): TSH, T4TOTAL, T3FREE, THYROIDAB in the last 72 hours.  Invalid input(s): FREET3 Anemia Panel: No results for input(s): VITAMINB12, FOLATE, FERRITIN, TIBC, IRON, RETICCTPCT in the last 72 hours.   Physical Exam: Blood pressure 120/60, pulse 66, temperature 97.9 F (36.6 C), temperature source Oral, resp. rate (!) 22, height 5' 4"  (1.626 m), weight 59.9 kg  (132 lb), SpO2 97 %.   Wt Readings from Last 1 Encounters:  04/23/17 59.9 kg (132 lb)     General appearance: alert and cooperative Resp: clear to auscultation bilaterally Cardio: regular rate and rhythm GI: soft, non-tender; bowel sounds normal; no masses,  no organomegaly Extremities: extremities normal, atraumatic, no cyanosis or edema Neurologic: Grossly normal  TELEMETRY: Reviewed telemetry pt in nsr with intermittent afib:  ASSESSMENT AND PLAN:  Principal Problem:   Colitis-improving.  Active Problems:   UTI due to extended-spectrum beta lactamase (ESBL) producing Escherichia coli   nstemi-cath completed today with no immediate complications. LVEDP 20 mmHg. LM normal, LAD-luminal irregularities, LCx-luminal irregularities. RCA-occluded with collaterals from LAD.  Medical management. Well collateralized rca. Increased myocardial demand during severe diarrhea appears to have caused the bump in troponin. Not candidate for cabg or pci. Continue with asa, statin. EF 45% by echo. Beta blockers held due to bradycardia. Will check renal funciton post cath in am.   Transient afib- will need to consider long term anticoagulation after hemostasis based on afib burdon and chads score.  Teodoro Spray, MD, [facility] 04/23/2017 10:23 AM

## 2017-04-23 NOTE — Progress Notes (Signed)
Will start on eliquis due to paroxysmal afib and chads score of at least 4.

## 2017-04-23 NOTE — Progress Notes (Signed)
[place] INFECTIOUS DISEASE PROGRESS NOTE Date of Admission:  04/19/2017     ID: KAYSEN SEFCIK is a 76 y.o. female with ESBL UTI and EPEC associated diarrhea Principal Problem:   Colitis Active Problems:   UTI due to extended-spectrum beta lactamase (ESBL) producing Escherichia coli  Subjective: Much less diarrhea, noi fevers, some cough  ROS  Eleven systems are reviewed and negative except per hpi  Medications:  Antibiotics Given (last 72 hours)    Date/Time Action Medication Dose   04/20/17 2208 Given   hydroxychloroquine (PLAQUENIL) tablet 200 mg 200 mg   04/21/17 0942 Given   hydroxychloroquine (PLAQUENIL) tablet 200 mg 200 mg   04/21/17 2213 Given   hydroxychloroquine (PLAQUENIL) tablet 200 mg 200 mg   04/22/17 1014 Given   hydroxychloroquine (PLAQUENIL) tablet 200 mg 200 mg   04/22/17 2135 Given   hydroxychloroquine (PLAQUENIL) tablet 200 mg 200 mg     . apixaban  5 mg Oral BID  . aspirin EC  325 mg Oral Daily  . atorvastatin  40 mg Oral q1800  . budesonide  6 mg Oral Daily  . calcium-vitamin D  1 tablet Oral BID  . diltiazem  240 mg Oral Daily  . ferrous sulfate  325 mg Oral Q breakfast  . hydroxychloroquine  200 mg Oral BID  . Influenza vac split quadrivalent PF  0.5 mL Intramuscular Tomorrow-1000  . leflunomide  20 mg Oral Daily  . losartan  100 mg Oral Daily  . multivitamin with minerals  1 tablet Oral Daily  . nystatin  5 mL Oral QID  . pantoprazole  40 mg Oral Daily  . potassium chloride  20 mEq Oral BID  . predniSONE  50 mg Oral Q breakfast  . sertraline  50 mg Oral QHS  . sodium chloride flush  3 mL Intravenous Q12H    Objective: Vital signs in last 24 hours: Temp:  [97.5 F (36.4 C)-98.2 F (36.8 C)] 98.2 F (36.8 C) (10/08 1104) Pulse Rate:  [66-79] 69 (10/08 1119) Resp:  [17-22] 18 (10/08 1119) BP: (120-144)/(60-79) 122/79 (10/08 1119) SpO2:  [96 %-100 %] 100 % (10/08 1119) Weight:  [59.9 kg (132 lb)-59.9 kg (132 lb 1.6 oz)] 59.9 kg  (132 lb) (10/08 0704) Constitutional:  oriented to person, place, and time. appears well-developed and well-nourished. No distress.  HENT: [place]/AT, PERRLA, no scleral icterus Mouth/Throat: Oropharynx is clear and moist. No oropharyngeal exudate.  Cardiovascular: Normal rate, regular rhythm and normal heart sounds. Exam reveals no gallop and no friction rub.  No murmur heard.  Pulmonary/Chest: Effort normal and breath sounds normal. No respiratory distress.  has no wheezes.  Neck = supple, no nuchal rigidity Abdominal: Soft. Bowel sounds are normal.  exhibits no distension. There is no tenderness.  Lymphadenopathy: no cervical adenopathy. No axillary adenopathy Neurological: alert and oriented to person, place, and time.  Skin: Skin is warm and dry. No rash noted. No erythema.  Psychiatric: a normal mood and affect.  behavior is normal.   Lab Results  Recent Labs  04/22/17 0322 04/23/17 0613  WBC 11.5* 16.9*  HGB 8.2* 8.4*  HCT 24.7* 25.6*  NA 143  --   K 4.5  --   CL 116*  --   CO2 19*  --   BUN 20  --   CREATININE 0.63  --     Microbiology: Results for orders placed or performed during the hospital encounter of 04/19/17  Gastrointestinal Panel by PCR , Stool  Status: None   Collection Time: 04/19/17 11:12 AM  Result Value Ref Range Status   Campylobacter species NOT DETECTED NOT DETECTED Final   Plesimonas shigelloides NOT DETECTED NOT DETECTED Final   Salmonella species NOT DETECTED NOT DETECTED Final   Yersinia enterocolitica NOT DETECTED NOT DETECTED Final   Vibrio species NOT DETECTED NOT DETECTED Final   Vibrio cholerae NOT DETECTED NOT DETECTED Final   Enteroaggregative E coli (EAEC) NOT DETECTED NOT DETECTED Final   Enteropathogenic E coli (EPEC) NOT DETECTED NOT DETECTED Final   Enterotoxigenic E coli (ETEC) NOT DETECTED NOT DETECTED Final   Shiga like toxin producing E coli (STEC) NOT DETECTED NOT DETECTED Final   Shigella/Enteroinvasive E coli (EIEC) NOT  DETECTED NOT DETECTED Final   Cryptosporidium NOT DETECTED NOT DETECTED Final   Cyclospora cayetanensis NOT DETECTED NOT DETECTED Final   Entamoeba histolytica NOT DETECTED NOT DETECTED Final   Giardia lamblia NOT DETECTED NOT DETECTED Final   Adenovirus F40/41 NOT DETECTED NOT DETECTED Final   Astrovirus NOT DETECTED NOT DETECTED Final   Norovirus GI/GII NOT DETECTED NOT DETECTED Final   Rotavirus A NOT DETECTED NOT DETECTED Final   Sapovirus (I, II, IV, and V) NOT DETECTED NOT DETECTED Final  Urine Culture     Status: None   Collection Time: 04/20/17  6:09 PM  Result Value Ref Range Status   Specimen Description URINE, CLEAN CATCH  Final   Special Requests NONE  Final   Culture   Final    NO GROWTH Performed at [facility], [address] [place], [place] [address]    Report Status 04/22/2017 FINAL  Final    Studies/Results: No results found.  Assessment/Plan: ALBERTIA CARVIN is a 76 y.o. female admitted with diarrhea after stopping enterocort which she was on for one month with improvement in her chronic diarrhea which had been worked up by Dr Vira Agar.  Since stopping it she has had mod to severe diarrhea and occas nv. She had dx of UTI with ESBL E coli a week ago but had no sxs - dd have TNTC WBC on UA. Treated with keflex until cx showed ESBL so took one dose macrobid but did not tolerate it. She also had EPEC on Stool PCR but repeat neg. Now main issue is the diarrhea and has a NSTEMI. Her wbc was mildly elevated but is on steroids 5 mg and had MI which both can raise WBC.  Current UA with 0-5 wbc. I do not think she has active infection so would stop abx and monitor  Fu UA was negative. UCX negative.  We stopped abx 10/5 and doing well.  Recommendations Continue to follow off abx Given new cough check resp pcr Cont enterocort and would discuss with GI other measures to control her diarrhea.   Thank you very much for the consult. Will follow with you.  Leonel Ramsay   04/23/2017, 3:05 PM

## 2017-04-23 NOTE — Progress Notes (Signed)
[place] [facility] [place] NAME: Roylene Heaton    MR#:  [id number]  DATE OF BIRTH:  [birth year]  SUBJECTIVE:   One episode of loose stool overnight. SOB better.  Cath with no PCI  Improving  REVIEW OF SYSTEMS:   Review of Systems  Constitutional: Negative for chills, fever and weight loss.  HENT: Negative for ear discharge, ear pain and nosebleeds.   Eyes: Negative for blurred vision, pain and discharge.  Respiratory: Negative for sputum production, shortness of breath, wheezing and stridor.   Cardiovascular: Negative for chest pain, palpitations, orthopnea and PND.  Gastrointestinal: Positive for diarrhea. Negative for abdominal pain, nausea and vomiting.  Genitourinary: Negative for frequency and urgency.  Musculoskeletal: Negative for back pain and joint pain.  Neurological: Positive for weakness. Negative for sensory change, speech change and focal weakness.  Psychiatric/Behavioral: Negative for depression and hallucinations. The patient is not nervous/anxious.    Tolerating Diet: yes.  DRUG ALLERGIES:   Allergies  Allergen Reactions  . Lodine [Etodolac] Hives and Itching  . Methotrexate Derivatives Other (See Comments)    Flu like symptoms  . Norvasc [Amlodipine]   . Pentasa [Mesalamine Er]   . Red Yeast Rice Extract   . Vioxx [Rofecoxib]   . Levaquin [Levofloxacin] Itching, Rash and Other (See Comments)    Muscle pain  . Sulfa Antibiotics Itching and Rash    VITALS:  Blood pressure 122/79, pulse 69, temperature 98.2 F (36.8 C), temperature source Oral, resp. rate 18, height 5' 4"  (1.626 m), weight 59.9 kg (132 lb), SpO2 100 %.  PHYSICAL EXAMINATION:   Physical Exam  GENERAL:  76 y.o.-year-old patient lying in the bed with no acute distress.  EYES: Pupils equal, round, reactive to light and accommodation. No scleral icterus. Extraocular muscles intact.  HEENT: Head atraumatic, normocephalic. Oropharynx and  nasopharynx clear.  NECK:  Supple, no jugular venous distention. No thyroid enlargement, no tenderness.  LUNGS: Decreased air entry with bilateral wheezing CARDIOVASCULAR: S1, S2 normal. No murmurs, rubs, or gallops.  ABDOMEN: Soft, nontender, nondistended. Bowel sounds present. No organomegaly or mass.  EXTREMITIES: No cyanosis, clubbing or edema b/l.    NEUROLOGIC: Cranial nerves II through XII are intact. No focal Motor or sensory deficits b/l.   PSYCHIATRIC:  patient is alert and oriented x 3.  SKIN: No obvious rash, lesion, or ulcer.   LABORATORY PANEL:  CBC  Recent Labs Lab 04/23/17 0613  WBC 16.9*  HGB 8.4*  HCT 25.6*  PLT 253    Chemistries   Recent Labs Lab 04/19/17 1034  04/22/17 0322  NA 140  < > 143  K 3.2*  < > 4.5  CL 106  < > 116*  CO2 18*  < > 19*  GLUCOSE 91  < > 245*  BUN 14  < > 20  CREATININE 0.73  < > 0.63  CALCIUM 8.8*  < > 8.9  AST 102*  --   --   ALT 36  --   --   ALKPHOS 52  --   --   BILITOT 1.3*  --   --   < > = values in this interval not displayed. Cardiac Enzymes  Recent Labs Lab 04/20/17 0320  TROPONINI 12.56*   RADIOLOGY:  No results found. ASSESSMENT AND PLAN:   Adaira Centola  is a 76 y.o. female with a known history of Anxiety, COPD, depression, Diverticulosis, HLD, IBS- had colonoscopy and Endoscopy by Dr. Tiffany Kocher done 2-3  months ago- no abnormalities found. Had UTI , came to ER last week- given keflex- and also had diarrhea- after checking GI panel- sent home from ER Cont to have abd pain, Nausea, not eating well, chills, diarrhea, so brought to ER.  * Acute NSTEMI  - Seen By cardiology Dr Ubaldo Glassing. 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  * Recent  ESBL uti Finished antibiotics. Repeat urinalysis was normal. Antibiotics stopped here.    * EPEC Off abx  Recent Colonoscopy done,   As per her  Entocort given and tapered Off after one month.  * Hyperlipidemia   Cont Pravastatin.  * COPD exacerbation. Started steroids. Nebulizers. Ordered incentive spirometer. Oxygen as needed. Chest x-ray  shows chronic changes. Nothing acute.  D/w daughters at bedside  Discharge tomorrow  Case discussed with Care Management/Social Worker. Management plans discussed with the patient, family and they are in agreement.  CODE STATUS: Full  DVT Prophylaxis: heparin gtt  TOTAL TIME TAKING CARE OF THIS PATIENT: 30 minutes.   POSSIBLE D/C IN 1-2 DAYS, DEPENDING ON CLINICAL CONDITION.  Note: This dictation was prepared with Dragon dictation along with smaller phrase technology. Any transcriptional errors that result from this process are unintentional.  Hillary Bow R M.D on 04/23/2017 at 12:17 PM  Between 7am to 6pm - Pager - [phone]  After 6pm go to [email or web address] - password [email or web address] [facility]  Office  [phone]  CC: Primary care physician; [name], MDPatient ID: [name], female   DOB: 30-Aug-1940, 76 y.o.   MRN: [id number]

## 2017-04-23 NOTE — Clinical Social Work Note (Signed)
CSW presented bed offers to patient's family and they chose [facility] [place].  CSW contacted [facility] who will start insurance authorization today.  [facility] can [facility] once insurance has been approved.  Jones Broom. Roman Forest, MSW, [place]  04/23/2017 4:58 PM

## 2017-04-23 NOTE — Progress Notes (Signed)
PT Cancellation Note  Patient Details Name: KIERSTON PLASENCIA MRN: [id number] DOB: November 07, 1940   Cancelled Treatment:    Reason Eval/Treat Not Completed: Patient at procedure or test/unavailable.  Pt currently off floor for procedure.  Will re-attempt PT treatment session at a later date/time.  Leitha Bleak, PT 04/23/17, 9:02 AM [phone]

## 2017-04-23 NOTE — Progress Notes (Signed)
OT Cancellation Note  Patient Details Name: Melissa [name] MRN: [id number] DOB: [birth year]   Cancelled Treatment:    Reason Eval/Treat Not Completed: Patient at procedure or test/ unavailable. Pt out of room for procedure. Will re-attempt as medically appropriate at later time/date as pt is available.  Jeni Salles, MPH, MS, OTR/L ascom [phone] 04/23/17, 8:40 AM

## 2017-04-23 NOTE — Care Management Important Message (Signed)
Important Message  Patient Details  Name: Melissa [name] MRN: [id number] Date of Birth: 1940-08-17   Medicare Important Message Given:  Yes Signed IM notice given    Katrina Stack, RN 04/23/2017, 3:45 PM

## 2017-04-23 NOTE — Progress Notes (Signed)
OT Cancellation Note  Patient Details Name: Melissa [name] MRN: [id number] DOB: Dec 25, 1940   Cancelled Treatment:    Reason Eval/Treat Not Completed: Patient not medically ready. Pt has returned from femoral cardiac catheterization. Bed rest orders in place. Will hold OT treatment this date and re-attempt next date, pending pt medically appropriate.   Jeni Salles, MPH, MS, OTR/L ascom [phone] 04/23/17, 2:01 PM

## 2017-04-24 ENCOUNTER — Encounter: Payer: Self-pay | Admitting: *Deleted

## 2017-04-24 LAB — RESPIRATORY PANEL BY PCR
Adenovirus: NOT DETECTED
Bordetella pertussis: NOT DETECTED
CORONAVIRUS 229E-RVPPCR: NOT DETECTED
CORONAVIRUS HKU1-RVPPCR: NOT DETECTED
Chlamydophila pneumoniae: NOT DETECTED
Coronavirus NL63: NOT DETECTED
Coronavirus OC43: NOT DETECTED
INFLUENZA B-RVPPCR: NOT DETECTED
Influenza A: NOT DETECTED
METAPNEUMOVIRUS-RVPPCR: NOT DETECTED
MYCOPLASMA PNEUMONIAE-RVPPCR: NOT DETECTED
PARAINFLUENZA VIRUS 2-RVPPCR: NOT DETECTED
Parainfluenza Virus 1: NOT DETECTED
Parainfluenza Virus 3: NOT DETECTED
Parainfluenza Virus 4: NOT DETECTED
RESPIRATORY SYNCYTIAL VIRUS-RVPPCR: NOT DETECTED
Rhinovirus / Enterovirus: DETECTED — AB

## 2017-04-24 LAB — BASIC METABOLIC PANEL
ANION GAP: 11 (ref 5–15)
BUN: 20 mg/dL (ref 6–20)
CHLORIDE: 103 mmol/L (ref 101–111)
CO2: 28 mmol/L (ref 22–32)
Calcium: 9.7 mg/dL (ref 8.9–10.3)
Creatinine, Ser: 0.87 mg/dL (ref 0.44–1.00)
GFR calc Af Amer: >60 mL/min (ref >60)
Glucose, Bld: 144 mg/dL — ABNORMAL HIGH (ref 65–99)
POTASSIUM: 4.1 mmol/L (ref 3.5–5.1)
Sodium: 142 mmol/L (ref 135–145)

## 2017-04-24 MED ORDER — LORAZEPAM 1 MG PO TABS: 1.0000 mg | ORAL_TABLET | Freq: Two times a day (BID) | ORAL | 0 refills | Status: DC | PRN

## 2017-04-24 MED ORDER — FUROSEMIDE 10 MG/ML IJ SOLN: 40.0000 mg | Freq: Once | INTRAMUSCULAR | Status: DC

## 2017-04-24 MED ORDER — FUROSEMIDE 10 MG/ML IJ SOLN
40.0000 mg | Freq: Once | INTRAMUSCULAR | Status: AC
  Administered 2017-04-24: 40 mg via INTRAVENOUS
  Filled 2017-04-24: qty 4

## 2017-04-24 MED ORDER — APIXABAN 5 MG PO TABS: 5.0000 mg | ORAL_TABLET | Freq: Two times a day (BID) | ORAL | Status: AC

## 2017-04-24 MED ORDER — POTASSIUM CHLORIDE CRYS ER 20 MEQ PO TBCR: 20.0000 meq | EXTENDED_RELEASE_TABLET | Freq: Every day | ORAL | Status: DC

## 2017-04-24 MED ORDER — NYSTATIN 100000 UNIT/ML MT SUSP: 5.0000 mL | Freq: Four times a day (QID) | OROMUCOSAL | 0 refills | Status: DC

## 2017-04-24 MED ORDER — FUROSEMIDE 20 MG PO TABS: 20.0000 mg | ORAL_TABLET | Freq: Every day | ORAL | 11 refills | Status: DC

## 2017-04-24 MED ORDER — ASPIRIN EC 81 MG PO TBEC: 81.0000 mg | DELAYED_RELEASE_TABLET | Freq: Every day | ORAL | Status: AC

## 2017-04-24 NOTE — Progress Notes (Signed)
Physical Therapy Treatment Patient Details Name: Melissa [name] MRN: [id number] DOB: 06-Dec-1940 Today's Date: 04/24/2017    History of Present Illness Pt is a 76 y.o. female with recent dx of UTI presenting to hospital with diarrhea, nausea, not eating well.  Pt's troponin noted to be 15.78 (now downtrending).  Pt admitted to hospital with NSTEMI, ESBL UTI, and EPEC.  PMH includes IBS, RA, COPD, ataxia, depression, excessive falling, htn, psoriasis, Raynaud's, SOB with exercise, back surgery, L TKA.    PT Comments    Pt required min assist for supine to sit and sit to stand transfers; unable to generate adequate power through LEs for standing, required min assist to come into full standing. Able to ambulate 75f with RW, min guard for safety. Pt reported feeling fatigued at end of session, but it felt good to get up and moving. Next session, plan to progress activity as tolerated.  Follow Up Recommendations  SNF     Equipment Recommendations  Rolling walker with 5" wheels    Recommendations for Other Services       Precautions / Restrictions Precautions Precautions: Fall Restrictions Weight Bearing Restrictions: No    Mobility  Bed Mobility Overal bed mobility: Needs Assistance Bed Mobility: Supine to Sit;Sit to Supine;Rolling Rolling: Modified independent (Device/Increase time)   Supine to sit: HOB elevated;Min assist Sit to supine: Min guard   General bed mobility comments: min assist: pt used PT's hand to pull into sitting from supine; bed rails needed to roll onto side  Transfers Overall transfer level: Needs assistance Equipment used: Rolling walker (2 wheeled) Transfers: Sit to/from Stand Sit to Stand: Min assist         General transfer comment: Min assist to reach full upright; increased effort for LE power generation for full standing; controlled descent into sitting  Ambulation/Gait Ambulation/Gait assistance: Min guard Ambulation Distance (Feet): 20  Feet Assistive device: Rolling walker (2 wheeled) Gait Pattern/deviations: Step-through pattern;Decreased stride length     General Gait Details: decreased gait speed   Stairs            Wheelchair Mobility    Modified Rankin (Stroke Patients Only)       Balance Overall balance assessment: Needs assistance Sitting-balance support: Feet supported Sitting balance-Leahy Scale: Fair     Standing balance support: Bilateral upper extremity supported Standing balance-Leahy Scale: Fair Standing balance comment: BUE support on RW for gait                            Cognition Arousal/Alertness: Awake/alert Behavior During Therapy: WFL for tasks assessed/performed Overall Cognitive Status: Within Functional Limits for tasks assessed                                        Exercises Total Joint Exercises Ankle Circles/Pumps: AROM;Both;15 reps;Supine Heel Slides: AROM;Both;10 reps;Supine     General Comments        Pertinent Vitals/Pain Pain Assessment: No/denies pain    Home Living                      Prior Function            PT Goals (current goals can now be found in the care plan section) Acute Rehab PT Goals Patient Stated Goal: to improve strength PT Goal Formulation: With patient Time For Goal  Achievement: 05/04/17 Potential to Achieve Goals: Good Progress towards PT goals: Progressing toward goals    Frequency    Min 2X/week      PT Plan Current plan remains appropriate    Co-evaluation              AM-PAC PT "6 Clicks" Daily Activity  Outcome Measure  Difficulty turning over in bed (including adjusting bedclothes, sheets and blankets)?: A Little Difficulty moving from lying on back to sitting on the side of the bed? : Unable Difficulty sitting down on and standing up from a chair with arms (e.g., wheelchair, bedside commode, etc,.)?: Unable Help needed moving to and from a bed to chair  (including a wheelchair)?: A Little Help needed walking in hospital room?: A Little Help needed climbing 3-5 steps with a railing? : A Lot 6 Click Score: 13    End of Session Equipment Utilized During Treatment: Gait belt Activity Tolerance: Patient tolerated treatment well Patient left: in chair;with call bell/phone within reach;with chair alarm set;with family/visitor present Nurse Communication: Mobility status PT Visit Diagnosis: Muscle weakness (generalized) (M62.81);Unsteadiness on feet (R26.81)     Time: 8372-9021 PT Time Calculation (min) (ACUTE ONLY): 26 min  Charges:                       G CodesWetzel Bjornstad, SPT 04/24/2017, 1:06 PM

## 2017-04-24 NOTE — Discharge Instructions (Signed)
Heart healthy diet  Activity as tolerated with assistance

## 2017-04-24 NOTE — Progress Notes (Signed)
       [facility]  SUBJECTIVE: Feels better. No complaints.    Vitals:   04/23/17 1954 04/24/17 0458 04/24/17 0757 04/24/17 1148  BP: (!) 145/70 (!) 144/69 (!) 129/54 (!) 107/45  Pulse: 71 80 66 71  Resp: 18 19 16 18   Temp: 98 F (36.7 C) 98.1 F (36.7 C) 98.1 F (36.7 C) 97.7 F (36.5 C)  TempSrc: Oral Oral Oral Oral  SpO2: 97% 97% 98% 97%  Weight:      Height:        Intake/Output Summary (Last 24 hours) at 04/24/17 1510 Last data filed at 04/24/17 1301  Gross per 24 hour  Intake                3 ml  Output             4300 ml  Net            -4297 ml    LABS: Basic Metabolic Panel:  Recent Labs  04/22/17 0322 04/24/17 0440  NA 143 142  K 4.5 4.1  CL 116* 103  CO2 19* 28  GLUCOSE 245* 144*  BUN 20 20  CREATININE 0.63 0.87  CALCIUM 8.9 9.7   Liver Function Tests: No results for input(s): AST, ALT, ALKPHOS, BILITOT, PROT, ALBUMIN in the last 72 hours. No results for input(s): LIPASE, AMYLASE in the last 72 hours. CBC:  Recent Labs  04/22/17 0322 04/23/17 0613  WBC 11.5* 16.9*  NEUTROABS 10.5*  --   HGB 8.2* 8.4*  HCT 24.7* 25.6*  MCV 94.7 94.8  PLT 228 253   Cardiac Enzymes: No results for input(s): CKTOTAL, CKMB, CKMBINDEX, TROPONINI in the last 72 hours. BNP: Invalid input(s): POCBNP D-Dimer: No results for input(s): DDIMER in the last 72 hours. Hemoglobin A1C:  Recent Labs  04/23/17 0606  HGBA1C 6.3*   Fasting Lipid Panel: No results for input(s): CHOL, HDL, LDLCALC, TRIG, CHOLHDL, LDLDIRECT in the last 72 hours. Thyroid Function Tests: No results for input(s): TSH, T4TOTAL, T3FREE, THYROIDAB in the last 72 hours.  Invalid input(s): FREET3 Anemia Panel: No results for input(s): VITAMINB12, FOLATE, FERRITIN, TIBC, IRON, RETICCTPCT in the last 72 hours.   Physical Exam: Blood pressure (!) 107/45, pulse 71, temperature 97.7 F (36.5 C), temperature source Oral, resp. rate 18, height 5'  4" (1.626 m), weight 59.9 kg (132 lb), SpO2 97 %.   Wt Readings from Last 1 Encounters:  04/23/17 59.9 kg (132 lb)     General appearance: alert and cooperative Resp: clear to auscultation bilaterally Cardio: regular rate and rhythm GI: soft, non-tender; bowel sounds normal; no masses,  no organomegaly Pulses: 2+ and symmetric Neurologic: Grossly normal  TELEMETRY: Reviewed telemetry pt in nsr at present. Transient afib:  ASSESSMENT AND PLAN:  Principal Problem:   Colitis-improved.  Active Problems:   UTI due to extended-spectrum beta lactamase (ESBL) producing Escherichia coli   nstemi-cardiac cath yesterday revealed chronically occluded rca filled by collaterals form the left. Insignificant disease in left system. Medical management. Cath site clean and dry today. Renal funciotn good. OK for discharge from cardiac ctandoint on Eliquis at 5 mg bid for afib, atorvastatin 40 mg daily, diltiazem 240 mg daily, losartan 100 mg daily. F/U with Dr. Saralyn Pilar in 1 week.  Teodoro Spray, MD, [facility] 04/24/2017 3:10 PM

## 2017-04-24 NOTE — Progress Notes (Addendum)
Occupational Therapy Treatment Patient Details Name: Melissa [name] MRN: [id number] DOB: 12-05-1940 Today's Date: 04/24/2017    History of present illness Pt is a 76 y.o. female with recent dx of UTI presenting to hospital with diarrhea, nausea, not eating well.  Pt's troponin noted to be 15.78 (now downtrending).  Pt admitted to hospital with NSTEMI, ESBL UTI, and EPEC.  PMH includes IBS, RA, COPD, ataxia, depression, excessive falling, htn, psoriasis, Raynaud's, SOB with exercise, back surgery, L TKA.   OT comments  Pt seen for OT treatment session. Pt educated in in-depth focus on energy conservation strategies, work simplification, home/body positioning/set up, falls prevention, and ADL/IADL considerations in order to maximize functional independence and minimize risk of falls, injury, readmission, and increased caregiver burden. Pt verbalized understanding of all education provided. Handout provided to pt and reviewed for questions/concerns. Continues to progress towards goals and benefit from skilled OT services to maximize return to PLOF. STR remains appropriate given pt's impaired activity tolerance, strength, increased need for assist with ADL and mobility, and high falls risk.    Follow Up Recommendations  SNF    Equipment Recommendations  Other (comment) (reacher, HH shower head, LH sponge, LH shoe horn)    Recommendations for Other Services      Precautions / Restrictions Precautions Precautions: Fall;Other (comment) Precaution Comments: droplet and enteric precautions Restrictions Weight Bearing Restrictions: No       Mobility Bed Mobility                  Transfers                      Balance                                           ADL either performed or assessed with clinical judgement   ADL Overall ADL's : Needs assistance/impaired                                       General ADL Comments: pt generally at  min guard level for ADL tasks when bending forward or standing, min assist for LB ADL tasks.      Vision Baseline Vision/History: No visual deficits Patient Visual Report: No change from baseline     Perception     Praxis      Cognition Arousal/Alertness: Awake/alert Behavior During Therapy: WFL for tasks assessed/performed Overall Cognitive Status: Within Functional Limits for tasks assessed                                          Exercises Other Exercises Other Exercises: Pt educated in in-depth energy conservation strategies including work simplification, body/environmental set up and positioning, and falls prevention strategies. Handout provided and reviewed with pt. Pt verbalized understanding. Other Exercises: Pt educated in bladder mgt/UTI prevention strategies to minimize risk of UTIs and resulting complications. Pt verbalized understanding.   Shoulder Instructions       General Comments      Pertinent Vitals/ Pain       Pain Assessment: No/denies pain  Home Living  Prior Functioning/Environment              Frequency  Min 1X/week        Progress Toward Goals  OT Goals(current goals can now be found in the care plan section)  Progress towards OT goals: Progressing toward goals  Acute Rehab OT Goals Patient Stated Goal: to improve strength OT Goal Formulation: With patient/family Time For Goal Achievement: 05/05/17 Potential to Achieve Goals: Good  Plan Discharge plan remains appropriate;Frequency remains appropriate    Co-evaluation                 AM-PAC PT "6 Clicks" Daily Activity     Outcome Measure   Help from another person eating meals?: None Help from another person taking care of personal grooming?: None Help from another person toileting, which includes using toliet, bedpan, or urinal?: A Little Help from another person bathing (including washing,  rinsing, drying)?: A Little Help from another person to put on and taking off regular upper body clothing?: None Help from another person to put on and taking off regular lower body clothing?: A Little 6 Click Score: 21    End of Session    OT Visit Diagnosis: Other abnormalities of gait and mobility (R26.89);Muscle weakness (generalized) (M62.81)   Activity Tolerance Patient tolerated treatment well   Patient Left in bed;with call bell/phone within reach;with bed alarm set;with family/visitor present   Nurse Communication          Time: 1829-9371 OT Time Calculation (min): 14 min  Charges: OT General Charges $OT Visit: 1 Visit OT Treatments $Self Care/Home Management : 8-22 mins  Jeni Salles, MPH, MS, OTR/L ascom [phone] 04/24/17, 11:42 AM

## 2017-04-24 NOTE — Discharge Summary (Signed)
[facility] NAME: Melissa [name]    MR#:  [id number]  DATE OF BIRTH:  07-24-1940  DATE OF ADMISSION:  04/19/2017 ADMITTING PHYSICIAN: Vaughan Basta, MD  DATE OF DISCHARGE: 04/24/2017  PRIMARY CARE PHYSICIAN: [name], MD   ADMISSION DIAGNOSIS:  Dehydration [E86.0] Diarrhea of infectious origin [A09] Urinary tract infection without hematuria, site unspecified [N39.0]  DISCHARGE DIAGNOSIS:  Principal Problem:   Colitis Active Problems:   UTI due to extended-spectrum beta lactamase (ESBL) producing Escherichia coli   SECONDARY DIAGNOSIS:   Past Medical History:  Diagnosis Date  . Anemia   . Anxiety   . Arthritis    rheumatoid  . Ataxia   . COPD (chronic obstructive pulmonary disease) ([place])   . Depression   . Diverticulosis   . Excessive falling   . GERD (gastroesophageal reflux disease)   . Hyperlipidemia   . Hypertension   . IBS (irritable bowel syndrome)   . Osteoarthritis   . Psoriasis   . Raynaud's disease without gangrene   . Raynaud's disease without gangrene   . Shortness of breath dyspnea    with exertion     ADMITTING HISTORY   HISTORY OF PRESENT ILLNESS: Melissa [name]  is a 76 y.o. female with a known history of Anxiety, COPD, depression, Diverticulosis, HLD, IBS- had colonoscopy and Endoscopy by Dr. Tiffany Kocher done 2-3 months ago- no abnormalities found. Had UTI , came to ER last week- given keflex- and also had diarrhea- after checking GI panel- sent home from ER> Cont to have abd pain, Nausea, not eating well, chills, diarrhea, so brought to ER. From Ur cx last week- noted to have ESBL, and GI panel EPEC. ER physician spoke to ID- suggest azithromycin and Invanz. After admitting the pt, while still in ER- troponin resulted > 15. No c/o chest pain, but have some EKG changes- urgently seen by cardiologist.  HOSPITAL COURSE:    Melissa [name] a 76 y.o.femalewith a known history of  Anxiety, COPD, depression, Diverticulosis, HLD, IBS- had colonoscopy and Endoscopy by Dr. Tiffany Kocher done 2-3 months ago- no abnormalities found. Had UTI , came to ER last week- given keflex- and also had diarrhea- after checking GI panel- sent home from ER Cont to have abd pain, Nausea, not eating well, chills, diarrhea, so brought to ER.  * Acute NSTEMI -Seen By cardiology Dr Ubaldo Glassing. 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 Repeat urinalysis was normal. Antibiotics stopped here.  * Eneteropathogenic E Coli in stool Off abx Recent Colonoscopy done, As per her Entocort given and tapered . Restarted entocort. Repeat stool studies normal. F/U with Dr. Vira Agar in 1 week  * Afib On cardizem and Started on Eliquis  * Hyperlipidemia Cont statin  * COPD exacerbation. Started steroids. Nebulizers. Ordered incentive spirometer. Oxygen as needed. Chest x-ray  shows chronic changes. Nothing acute.  Patient stable for discharge to SNF   CONSULTS OBTAINED:  Treatment Team:  Leonel Ramsay, MD Teodoro Spray, MD  DRUG ALLERGIES:   Allergies  Allergen Reactions  . Lodine [Etodolac] Hives and Itching  . Methotrexate Derivatives Other (See Comments)    Flu like symptoms  . Norvasc [Amlodipine]   . Pentasa [Mesalamine Er]   . Red Yeast Rice Extract   . Vioxx [Rofecoxib]   . Levaquin [Levofloxacin] Itching, Rash  and Other (See Comments)    Muscle pain  . Sulfa Antibiotics Itching and Rash    DISCHARGE MEDICATIONS:   Current Discharge Medication List    START taking these medications   Details  apixaban (ELIQUIS) 5 MG TABS tablet Take 1 tablet (5 mg total) by mouth 2 (two) times daily. Qty: 60 tablet    furosemide (LASIX) 20 MG tablet Take 1 tablet (20 mg total) by mouth daily. Qty: 30  tablet, Refills: 11    nystatin (MYCOSTATIN) 100000 UNIT/ML suspension Take 5 mLs (500,000 Units total) by mouth 4 (four) times daily. Qty: 60 mL, Refills: 0    potassium chloride SA (K-DUR,KLOR-CON) 20 MEQ tablet Take 1 tablet (20 mEq total) by mouth daily.      CONTINUE these medications which have CHANGED   Details  aspirin EC 81 MG tablet Take 1 tablet (81 mg total) by mouth daily.    LORazepam (ATIVAN) 1 MG tablet Take 1 tablet (1 mg total) by mouth 2 (two) times daily as needed for anxiety. Qty: 10 tablet, Refills: 0      CONTINUE these medications which have NOT CHANGED   Details  budesonide (ENTOCORT EC) 3 MG 24 hr capsule Take 6 mg by mouth daily.    Calcium Carbonate-Vitamin D (CALCIUM 600+D) 600-400 MG-UNIT tablet Take 1 tablet by mouth 2 (two) times daily.    diltiazem (CARDIZEM CD) 240 MG 24 hr capsule Take 240 mg by mouth daily.    ferrous sulfate 325 (65 FE) MG tablet Take 325 mg by mouth daily with breakfast.    gemfibrozil (LOPID) 600 MG tablet Take 600 mg by mouth 2 (two) times daily before a meal.    hydroxychloroquine (PLAQUENIL) 200 MG tablet Take 200 mg by mouth 2 (two) times daily.     leflunomide (ARAVA) 20 MG tablet Take 20 mg by mouth daily.     losartan (COZAAR) 100 MG tablet Take 100 mg by mouth daily.    Multiple Vitamin (MULTIVITAMIN) tablet Take 1 tablet by mouth daily.    Multiple Vitamins-Minerals (PRESERVISION AREDS) CAPS Take 1 capsule by mouth 2 (two) times daily.    omeprazole (PRILOSEC) 20 MG capsule Take 20 mg by mouth 2 (two) times daily before a meal.     pravastatin (PRAVACHOL) 80 MG tablet Take 80 mg by mouth at bedtime.     predniSONE (DELTASONE) 5 MG tablet Take 5 mg by mouth daily.     sertraline (ZOLOFT) 50 MG tablet Take 50 mg by mouth at bedtime.     diphenoxylate-atropine (LOMOTIL) 2.5-0.025 MG tablet Take by mouth 4 (four) times daily as needed for diarrhea or loose stools.      STOP taking these medications      cephALEXin (KEFLEX) 500 MG capsule      oxyCODONE (OXY IR/ROXICODONE) 5 MG immediate release tablet      traMADol (ULTRAM) 50 MG tablet         Today   VITAL SIGNS:  Blood pressure (!) 129/54, pulse 66, temperature 98.1 F (36.7 C), temperature source Oral, resp. rate 16, height 5' 4"  (1.626 m), weight 59.9 kg (132 lb), SpO2 98 %.  I/O:   Intake/Output Summary (Last 24 hours) at 04/24/17 0842 Last data filed at 04/24/17 0100  Gross per 24 hour  Intake                3 ml  Output             4200 ml  Net            -4197 ml    PHYSICAL EXAMINATION:  Physical Exam  GENERAL:  76 y.o.-year-old patient lying in the bed with no acute distress.  LUNGS: Normal breath sounds bilaterally, no wheezing, rales,rhonchi or crepitation. No use of accessory muscles of respiration.  CARDIOVASCULAR: S1, S2 normal. No murmurs, rubs, or gallops.  ABDOMEN: Soft, non-tender, non-distended. Bowel sounds present. No organomegaly or mass.  NEUROLOGIC: Moves all 4 extremities. PSYCHIATRIC: The patient is alert and oriented x 3.  SKIN: No obvious rash, lesion, or ulcer.   DATA REVIEW:   CBC  Recent Labs Lab 04/23/17 0613  WBC 16.9*  HGB 8.4*  HCT 25.6*  PLT 253    Chemistries   Recent Labs Lab 04/19/17 1034  04/24/17 0440  NA 140  < > 142  K 3.2*  < > 4.1  CL 106  < > 103  CO2 18*  < > 28  GLUCOSE 91  < > 144*  BUN 14  < > 20  CREATININE 0.73  < > 0.87  CALCIUM 8.8*  < > 9.7  AST 102*  --   --   ALT 36  --   --   ALKPHOS 52  --   --   BILITOT 1.3*  --   --   < > = values in this interval not displayed.  Cardiac Enzymes  Recent Labs Lab 04/20/17 0320  TROPONINI 12.56*    Microbiology Results  Results for orders placed or performed during the hospital encounter of 04/19/17  Gastrointestinal Panel by PCR , Stool     Status: None   Collection Time: 04/19/17 11:12 AM  Result Value Ref Range Status   Campylobacter species NOT DETECTED NOT DETECTED Final   Plesimonas  shigelloides NOT DETECTED NOT DETECTED Final   Salmonella species NOT DETECTED NOT DETECTED Final   Yersinia enterocolitica NOT DETECTED NOT DETECTED Final   Vibrio species NOT DETECTED NOT DETECTED Final   Vibrio cholerae NOT DETECTED NOT DETECTED Final   Enteroaggregative E coli (EAEC) NOT DETECTED NOT DETECTED Final   Enteropathogenic E coli (EPEC) NOT DETECTED NOT DETECTED Final   Enterotoxigenic E coli (ETEC) NOT DETECTED NOT DETECTED Final   Shiga like toxin producing E coli (STEC) NOT DETECTED NOT DETECTED Final   Shigella/Enteroinvasive E coli (EIEC) NOT DETECTED NOT DETECTED Final   Cryptosporidium NOT DETECTED NOT DETECTED Final   Cyclospora cayetanensis NOT DETECTED NOT DETECTED Final   Entamoeba histolytica NOT DETECTED NOT DETECTED Final   Giardia lamblia NOT DETECTED NOT DETECTED Final   Adenovirus F40/41 NOT DETECTED NOT DETECTED Final   Astrovirus NOT DETECTED NOT DETECTED Final   Norovirus GI/GII NOT DETECTED NOT DETECTED Final   Rotavirus A NOT DETECTED NOT DETECTED Final   Sapovirus (I, II, IV, and V) NOT DETECTED NOT DETECTED Final  Urine Culture     Status: None   Collection Time: 04/20/17  6:09 PM  Result Value Ref Range Status   Specimen Description URINE, CLEAN CATCH  Final   Special Requests NONE  Final   Culture   Final    NO GROWTH Performed at [facility], [address], [place], [place] [address]    Report Status 04/22/2017 FINAL  Final    RADIOLOGY:  No results found.  Follow up with PCP in 1 week.  Management plans discussed with the patient, family and they are in agreement.  CODE STATUS:  Code Status Orders        Start     Ordered   04/19/17 1750  Full code  Continuous     04/19/17 1750    Code Status History    Date Active Date Inactive Code Status Order ID Comments User Context   04/04/2016 10:27 AM 04/07/2016  2:51 PM Full Code [id number]  Hessie Knows, MD Inpatient      TOTAL TIME TAKING CARE OF THIS PATIENT ON DAY  OF DISCHARGE: more than 30 minutes.   Hillary Bow R M.D on 04/24/2017 at 8:42 AM  Between 7am to 6pm - Pager - [phone]  After 6pm go to [email or web address] - password [email or web address] [facility]  Office  [phone]  CC: Primary care physician; [name], MD  Note: This dictation was prepared with Dragon dictation along with smaller phrase technology. Any transcriptional errors that result from this process are unintentional.

## 2017-04-24 NOTE — Plan of Care (Signed)
Problem: Education: Goal: Knowledge of [facility] Education information/materials will improve Outcome: Completed/Met Date Met: 04/24/17 Pt with no complaints of pain this shift, external catheter in place, putting out good amount of urine. Cath site on right groin, at a level 0, clean no bruising/bleeding, soft. One episode of confusion around midnight, but easily redirected.   

## 2017-04-25 DIAGNOSIS — N390 Urinary tract infection, site not specified: Secondary | ICD-10-CM | POA: Diagnosis not present

## 2017-04-25 DIAGNOSIS — R2681 Unsteadiness on feet: Secondary | ICD-10-CM | POA: Diagnosis not present

## 2017-04-25 DIAGNOSIS — R05 Cough: Secondary | ICD-10-CM | POA: Diagnosis not present

## 2017-04-25 DIAGNOSIS — R197 Diarrhea, unspecified: Secondary | ICD-10-CM | POA: Diagnosis not present

## 2017-04-25 DIAGNOSIS — Z5189 Encounter for other specified aftercare: Secondary | ICD-10-CM | POA: Diagnosis not present

## 2017-04-25 DIAGNOSIS — M069 Rheumatoid arthritis, unspecified: Secondary | ICD-10-CM | POA: Diagnosis not present

## 2017-04-25 DIAGNOSIS — M6281 Muscle weakness (generalized): Secondary | ICD-10-CM | POA: Diagnosis not present

## 2017-04-25 DIAGNOSIS — R0989 Other specified symptoms and signs involving the circulatory and respiratory systems: Secondary | ICD-10-CM | POA: Diagnosis not present

## 2017-04-25 DIAGNOSIS — F329 Major depressive disorder, single episode, unspecified: Secondary | ICD-10-CM | POA: Diagnosis not present

## 2017-04-25 DIAGNOSIS — I2510 Atherosclerotic heart disease of native coronary artery without angina pectoris: Secondary | ICD-10-CM | POA: Diagnosis not present

## 2017-04-25 DIAGNOSIS — I10 Essential (primary) hypertension: Secondary | ICD-10-CM | POA: Diagnosis not present

## 2017-04-25 DIAGNOSIS — Z7901 Long term (current) use of anticoagulants: Secondary | ICD-10-CM | POA: Diagnosis not present

## 2017-04-25 DIAGNOSIS — Z23 Encounter for immunization: Secondary | ICD-10-CM | POA: Diagnosis not present

## 2017-04-25 DIAGNOSIS — E785 Hyperlipidemia, unspecified: Secondary | ICD-10-CM | POA: Diagnosis not present

## 2017-04-25 DIAGNOSIS — M0560 Rheumatoid arthritis of unspecified site with involvement of other organs and systems: Secondary | ICD-10-CM | POA: Diagnosis not present

## 2017-04-25 DIAGNOSIS — F419 Anxiety disorder, unspecified: Secondary | ICD-10-CM | POA: Diagnosis not present

## 2017-04-25 DIAGNOSIS — I480 Paroxysmal atrial fibrillation: Secondary | ICD-10-CM | POA: Diagnosis not present

## 2017-04-25 DIAGNOSIS — Z7982 Long term (current) use of aspirin: Secondary | ICD-10-CM | POA: Diagnosis not present

## 2017-04-25 DIAGNOSIS — E7800 Pure hypercholesterolemia, unspecified: Secondary | ICD-10-CM | POA: Diagnosis not present

## 2017-04-25 DIAGNOSIS — K5289 Other specified noninfective gastroenteritis and colitis: Secondary | ICD-10-CM | POA: Diagnosis not present

## 2017-04-25 DIAGNOSIS — I214 Non-ST elevation (NSTEMI) myocardial infarction: Secondary | ICD-10-CM | POA: Diagnosis not present

## 2017-04-25 DIAGNOSIS — A09 Infectious gastroenteritis and colitis, unspecified: Secondary | ICD-10-CM | POA: Diagnosis not present

## 2017-04-25 DIAGNOSIS — I4891 Unspecified atrial fibrillation: Secondary | ICD-10-CM | POA: Diagnosis not present

## 2017-04-25 DIAGNOSIS — F064 Anxiety disorder due to known physiological condition: Secondary | ICD-10-CM | POA: Diagnosis not present

## 2017-04-25 DIAGNOSIS — J441 Chronic obstructive pulmonary disease with (acute) exacerbation: Secondary | ICD-10-CM | POA: Diagnosis not present

## 2017-04-25 DIAGNOSIS — D508 Other iron deficiency anemias: Secondary | ICD-10-CM | POA: Diagnosis not present

## 2017-04-25 DIAGNOSIS — J449 Chronic obstructive pulmonary disease, unspecified: Secondary | ICD-10-CM | POA: Diagnosis not present

## 2017-04-25 DIAGNOSIS — E118 Type 2 diabetes mellitus with unspecified complications: Secondary | ICD-10-CM | POA: Diagnosis not present

## 2017-04-25 DIAGNOSIS — R748 Abnormal levels of other serum enzymes: Secondary | ICD-10-CM | POA: Diagnosis not present

## 2017-04-25 NOTE — Progress Notes (Signed)
Pt discharged to [facility]. Daughter transported to facility by daughter. Discharge instructions reviewed with patient and daughter. IV & heart monitor removed. Pt taken down to daughter's vehicle via wheelchair.

## 2017-04-25 NOTE — Discharge Summary (Signed)
[facility] [place] NAME: Melissa [name]    MR#:  [id number]  DATE OF BIRTH:  [birth year]  DATE OF ADMISSION:  04/19/2017 ADMITTING PHYSICIAN: Vaughan Basta, MD  DATE OF DISCHARGE: 04/25/2017  PRIMARY CARE PHYSICIAN: [name], MD   ADMISSION DIAGNOSIS:  Dehydration [E86.0] Diarrhea of infectious origin [A09] Urinary tract infection without hematuria, site unspecified [N39.0]  DISCHARGE DIAGNOSIS:  Principal Problem:   Colitis Active Problems:   UTI due to extended-spectrum beta lactamase (ESBL) producing Escherichia coli   SECONDARY DIAGNOSIS:   Past Medical History:  Diagnosis Date  . Anemia   . Anxiety   . Arthritis    rheumatoid  . Ataxia   . COPD (chronic obstructive pulmonary disease) ([place])   . Depression   . Diverticulosis   . Excessive falling   . GERD (gastroesophageal reflux disease)   . Hyperlipidemia   . Hypertension   . IBS (irritable bowel syndrome)   . Osteoarthritis   . Psoriasis   . Raynaud's disease without gangrene   . Raynaud's disease without gangrene   . Shortness of breath dyspnea    with exertion     ADMITTING HISTORY   HISTORY OF PRESENT ILLNESS: Melissa [name]  is a 76 y.o. female with a known history of Anxiety, COPD, depression, Diverticulosis, HLD, IBS- had colonoscopy and Endoscopy by Dr. Tiffany Kocher done 2-3 months ago- no abnormalities found. Had UTI , came to ER last week- given keflex- and also had diarrhea- after checking GI panel- sent home from ER> Cont to have abd pain, Nausea, not eating well, chills, diarrhea, so brought to ER. From Ur cx last week- noted to have ESBL, and GI panel EPEC. ER physician spoke to ID- suggest azithromycin and Invanz. After admitting the pt, while still in ER- troponin resulted > 15. No c/o chest pain, but have some EKG changes- urgently seen by cardiologist.  HOSPITAL COURSE:    LindaIsleyis a 76 y.o.femalewith a known history of  Anxiety, COPD, depression, Diverticulosis, HLD, IBS- had colonoscopy and Endoscopy by Dr. Tiffany Kocher done 2-3 months ago- no abnormalities found. Had UTI , came to ER last week- given keflex- and also had diarrhea- after checking GI panel- sent home from ER Cont to have abd pain, Nausea, not eating well, chills, diarrhea, so brought to ER.  * Acute NSTEMI -Seen By cardiology Dr Ubaldo Glassing. 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 Repeat urinalysis was normal. Antibiotics stopped here.  * Eneteropathogenic E Coli in stool Off abx Recent Colonoscopy done, As per her Entocort given and tapered . Restarted entocort. Repeat stool studies normal. F/U with Dr. Vira Agar in 1 week  * Afib On cardizem and Started on Eliquis  * Hyperlipidemia Cont statin  * COPD exacerbation. Started steroids. Nebulizers. Ordered incentive spirometer. Oxygen as needed. Chest x-ray  shows chronic changes. Nothing acute.  Patient stable for discharge to SNF   CONSULTS OBTAINED:  Treatment Team:  Teodoro Spray, MD  DRUG ALLERGIES:   Allergies  Allergen Reactions  . Lodine [Etodolac] Hives and Itching  . Methotrexate Derivatives Other (See Comments)    Flu like symptoms  . Norvasc [Amlodipine]   . Pentasa [Mesalamine Er]   . Red Yeast Rice Extract   . Vioxx [Rofecoxib]   . Levaquin [Levofloxacin] Itching, Rash and Other (See Comments)  Muscle pain  . Sulfa Antibiotics Itching and Rash    DISCHARGE MEDICATIONS:   Current Discharge Medication List    START taking these medications   Details  apixaban (ELIQUIS) 5 MG TABS tablet Take 1 tablet (5 mg total) by mouth 2 (two) times daily. Qty: 60 tablet    furosemide (LASIX) 20 MG tablet Take 1 tablet (20 mg total) by mouth daily. Qty: 30 tablet, Refills: 11     nystatin (MYCOSTATIN) 100000 UNIT/ML suspension Take 5 mLs (500,000 Units total) by mouth 4 (four) times daily. Qty: 60 mL, Refills: 0    potassium chloride SA (K-DUR,KLOR-CON) 20 MEQ tablet Take 1 tablet (20 mEq total) by mouth daily.      CONTINUE these medications which have CHANGED   Details  aspirin EC 81 MG tablet Take 1 tablet (81 mg total) by mouth daily.    LORazepam (ATIVAN) 1 MG tablet Take 1 tablet (1 mg total) by mouth 2 (two) times daily as needed for anxiety. Qty: 10 tablet, Refills: 0      CONTINUE these medications which have NOT CHANGED   Details  budesonide (ENTOCORT EC) 3 MG 24 hr capsule Take 6 mg by mouth daily.    Calcium Carbonate-Vitamin D (CALCIUM 600+D) 600-400 MG-UNIT tablet Take 1 tablet by mouth 2 (two) times daily.    diltiazem (CARDIZEM CD) 240 MG 24 hr capsule Take 240 mg by mouth daily.    ferrous sulfate 325 (65 FE) MG tablet Take 325 mg by mouth daily with breakfast.    gemfibrozil (LOPID) 600 MG tablet Take 600 mg by mouth 2 (two) times daily before a meal.    hydroxychloroquine (PLAQUENIL) 200 MG tablet Take 200 mg by mouth 2 (two) times daily.     leflunomide (ARAVA) 20 MG tablet Take 20 mg by mouth daily.     losartan (COZAAR) 100 MG tablet Take 100 mg by mouth daily.    Multiple Vitamin (MULTIVITAMIN) tablet Take 1 tablet by mouth daily.    Multiple Vitamins-Minerals (PRESERVISION AREDS) CAPS Take 1 capsule by mouth 2 (two) times daily.    omeprazole (PRILOSEC) 20 MG capsule Take 20 mg by mouth 2 (two) times daily before a meal.     pravastatin (PRAVACHOL) 80 MG tablet Take 80 mg by mouth at bedtime.     predniSONE (DELTASONE) 5 MG tablet Take 5 mg by mouth daily.     sertraline (ZOLOFT) 50 MG tablet Take 50 mg by mouth at bedtime.     diphenoxylate-atropine (LOMOTIL) 2.5-0.025 MG tablet Take by mouth 4 (four) times daily as needed for diarrhea or loose stools.      STOP taking these medications     cephALEXin (KEFLEX) 500 MG  capsule      oxyCODONE (OXY IR/ROXICODONE) 5 MG immediate release tablet      traMADol (ULTRAM) 50 MG tablet         Today   VITAL SIGNS:  Blood pressure (!) 130/51, pulse 64, temperature 97.8 F (36.6 C), temperature source Oral, resp. rate 16, height 5' 4"  (1.626 m), weight 59.9 kg (132 lb), SpO2 99 %.  I/O:    Intake/Output Summary (Last 24 hours) at 04/25/17 0919 Last data filed at 04/25/17 0449  Gross per 24 hour  Intake                0 ml  Output             1500 ml  Net            -  1500 ml    PHYSICAL EXAMINATION:  Physical Exam  GENERAL:  76 y.o.-year-old patient lying in the bed with no acute distress.  EYES: Pupils equal, round, reactive to light and accommodation. No scleral icterus. Extraocular muscles intact.  HEENT: Head atraumatic, normocephalic. Oropharynx and nasopharynx clear.  NECK:  Supple, no jugular venous distention. No thyroid enlargement, no tenderness.  LUNGS: Normal breath sounds bilaterally, no wheezing, rales,rhonchi or crepitation. No use of accessory muscles of respiration.  CARDIOVASCULAR: S1, S2 normal. No murmurs, rubs, or gallops.  ABDOMEN: Soft, nontender, nondistended. Bowel sounds present. No organomegaly or mass.  EXTREMITIES: No pedal edema, cyanosis, or clubbing.  NEUROLOGIC: Cranial nerves II through XII are intact. Muscle strength 5/5 in all extremities. Sensation intact. Gait not checked.  PSYCHIATRIC: The patient is alert and oriented x 3.  SKIN: No obvious rash, lesion, or ulcer.   DATA REVIEW:   CBC  Recent Labs Lab 04/23/17 0613  WBC 16.9*  HGB 8.4*  HCT 25.6*  PLT 253    Chemistries   Recent Labs Lab 04/19/17 1034  04/24/17 0440  NA 140  < > 142  K 3.2*  < > 4.1  CL 106  < > 103  CO2 18*  < > 28  GLUCOSE 91  < > 144*  BUN 14  < > 20  CREATININE 0.73  < > 0.87  CALCIUM 8.8*  < > 9.7  AST 102*  --   --   ALT 36  --   --   ALKPHOS 52  --   --   BILITOT 1.3*  --   --   < > = values in this interval  not displayed.  Cardiac Enzymes  Recent Labs Lab 04/20/17 0320  TROPONINI 12.56*    Microbiology Results  Results for orders placed or performed during the hospital encounter of 04/19/17  Gastrointestinal Panel by PCR , Stool     Status: None   Collection Time: 04/19/17 11:12 AM  Result Value Ref Range Status   Campylobacter species NOT DETECTED NOT DETECTED Final   Plesimonas shigelloides NOT DETECTED NOT DETECTED Final   Salmonella species NOT DETECTED NOT DETECTED Final   Yersinia enterocolitica NOT DETECTED NOT DETECTED Final   Vibrio species NOT DETECTED NOT DETECTED Final   Vibrio cholerae NOT DETECTED NOT DETECTED Final   Enteroaggregative E coli (EAEC) NOT DETECTED NOT DETECTED Final   Enteropathogenic E coli (EPEC) NOT DETECTED NOT DETECTED Final   Enterotoxigenic E coli (ETEC) NOT DETECTED NOT DETECTED Final   Shiga like toxin producing E coli (STEC) NOT DETECTED NOT DETECTED Final   Shigella/Enteroinvasive E coli (EIEC) NOT DETECTED NOT DETECTED Final   Cryptosporidium NOT DETECTED NOT DETECTED Final   Cyclospora cayetanensis NOT DETECTED NOT DETECTED Final   Entamoeba histolytica NOT DETECTED NOT DETECTED Final   Giardia lamblia NOT DETECTED NOT DETECTED Final   Adenovirus F40/41 NOT DETECTED NOT DETECTED Final   Astrovirus NOT DETECTED NOT DETECTED Final   Norovirus GI/GII NOT DETECTED NOT DETECTED Final   Rotavirus A NOT DETECTED NOT DETECTED Final   Sapovirus (I, II, IV, and V) NOT DETECTED NOT DETECTED Final  Urine Culture     Status: None   Collection Time: 04/20/17  6:09 PM  Result Value Ref Range Status   Specimen Description URINE, CLEAN CATCH  Final   Special Requests NONE  Final   Culture   Final    NO GROWTH Performed at [facility], [address], [place], [place] [address]    Report Status 04/22/2017 FINAL  Final  Respiratory Panel by PCR     Status: Abnormal   Collection Time: 04/23/17 10:15 AM  Result Value Ref Range Status    Adenovirus NOT DETECTED NOT DETECTED Final   Coronavirus 229E NOT DETECTED NOT DETECTED Final   Coronavirus HKU1 NOT DETECTED NOT DETECTED Final   Coronavirus NL63 NOT DETECTED NOT DETECTED Final   Coronavirus OC43 NOT DETECTED NOT DETECTED Final   Metapneumovirus NOT DETECTED NOT DETECTED Final   Rhinovirus / Enterovirus DETECTED (A) NOT DETECTED Final   Influenza A NOT DETECTED NOT DETECTED Final   Influenza B NOT DETECTED NOT DETECTED Final   Parainfluenza Virus 1 NOT DETECTED NOT DETECTED Final   Parainfluenza Virus 2 NOT DETECTED NOT DETECTED Final   Parainfluenza Virus 3 NOT DETECTED NOT DETECTED Final   Parainfluenza Virus 4 NOT DETECTED NOT DETECTED Final   Respiratory Syncytial Virus NOT DETECTED NOT DETECTED Final   Bordetella pertussis NOT DETECTED NOT DETECTED Final   Chlamydophila pneumoniae NOT DETECTED NOT DETECTED Final   Mycoplasma pneumoniae NOT DETECTED NOT DETECTED Final    Comment: Performed at [facility], [address] [place], [place] [address]    RADIOLOGY:  No results found.  Follow up with PCP in 1 week.  Management plans discussed with the patient, family and they are in agreement.  CODE STATUS:     Code Status Orders        Start     Ordered   04/19/17 1750  Full code  Continuous     04/19/17 1750    Code Status History    Date Active Date Inactive Code Status Order ID Comments User Context   04/04/2016 10:27 AM 04/07/2016  2:51 PM Full Code [id number]  Hessie Knows, MD Inpatient      TOTAL TIME TAKING CARE OF THIS PATIENT ON DAY OF DISCHARGE: more than 36 minutes.   Hatim Homann M.D on 04/25/2017 at 9:19 AM  Between 7am to 6pm - Pager - [phone]  After 6pm go to [email or web address] - password [email or web address] [facility]  Office  [phone]  CC: Primary care physician; [name], MD  Note: This dictation was prepared with Dragon dictation along with smaller phrase technology. Any transcriptional  errors that result from this process are unintentional.

## 2017-04-25 NOTE — Progress Notes (Signed)
[facility] [place] NAME: Melissa [name]    MR#:  [id number]  DATE OF BIRTH:  December 18, 1940  SUBJECTIVE:  CHIEF COMPLAINT:   Chief Complaint  Patient presents with  . Altered Mental Status   -Denies any complaints, feels stronger. Awaiting insurance authorization prior to discharge  REVIEW OF SYSTEMS:  Review of Systems  Constitutional: Negative for chills, fever and malaise/fatigue.  HENT: Negative for congestion, ear discharge, hearing loss and nosebleeds.   Eyes: Negative for blurred vision and double vision.  Respiratory: Negative for cough, shortness of breath and wheezing.   Cardiovascular: Negative for chest pain, palpitations and leg swelling.  Gastrointestinal: Positive for abdominal pain. Negative for constipation, diarrhea, nausea and vomiting.  Genitourinary: Negative for dysuria.  Musculoskeletal: Negative for myalgias.  Neurological: Negative for dizziness, speech change, focal weakness, seizures and headaches.  Psychiatric/Behavioral: Negative for depression.    DRUG ALLERGIES:   Allergies  Allergen Reactions  . Lodine [Etodolac] Hives and Itching  . Methotrexate Derivatives Other (See Comments)    Flu like symptoms  . Norvasc [Amlodipine]   . Pentasa [Mesalamine Er]   . Red Yeast Rice Extract   . Vioxx [Rofecoxib]   . Levaquin [Levofloxacin] Itching, Rash and Other (See Comments)    Muscle pain  . Sulfa Antibiotics Itching and Rash    VITALS:  Blood pressure (!) 130/51, pulse 64, temperature 97.8 F (36.6 C), temperature source Oral, resp. rate 16, height 5' 4"  (1.626 m), weight 59.9 kg (132 lb), SpO2 99 %.  PHYSICAL EXAMINATION:  Physical Exam  GENERAL:  76 y.o.-year-old patient lying in the bed with no acute distress.  EYES: Pupils equal, round, reactive to light and accommodation. No scleral icterus. Extraocular muscles intact.  HEENT: Head atraumatic, normocephalic. Oropharynx and nasopharynx clear.    NECK:  Supple, no jugular venous distention. No thyroid enlargement, no tenderness.  LUNGS: Normal breath sounds bilaterally, no wheezing, rales,rhonchi or crepitation. No use of accessory muscles of respiration.  CARDIOVASCULAR: S1, S2 normal. No murmurs, rubs, or gallops.  ABDOMEN: Soft, nontender, nondistended. Bowel sounds present. No organomegaly or mass.  EXTREMITIES: No pedal edema, cyanosis, or clubbing.  NEUROLOGIC: Cranial nerves II through XII are intact. Muscle strength 5/5 in all extremities. Sensation intact. Gait not checked.  PSYCHIATRIC: The patient is alert and oriented x 3.  SKIN: No obvious rash, lesion, or ulcer.    LABORATORY PANEL:   CBC  Recent Labs Lab 04/23/17 0613  WBC 16.9*  HGB 8.4*  HCT 25.6*  PLT 253   ------------------------------------------------------------------------------------------------------------------  Chemistries   Recent Labs Lab 04/19/17 1034  04/24/17 0440  NA 140  < > 142  K 3.2*  < > 4.1  CL 106  < > 103  CO2 18*  < > 28  GLUCOSE 91  < > 144*  BUN 14  < > 20  CREATININE 0.73  < > 0.87  CALCIUM 8.8*  < > 9.7  AST 102*  --   --   ALT 36  --   --   ALKPHOS 52  --   --   BILITOT 1.3*  --   --   < > = values in this interval not displayed. ------------------------------------------------------------------------------------------------------------------  Cardiac Enzymes  Recent Labs Lab 04/20/17 0320  TROPONINI 12.56*   ------------------------------------------------------------------------------------------------------------------  RADIOLOGY:  No results found.  EKG:   Orders placed or performed during the hospital encounter of 04/19/17  . ED EKG  . ED EKG  .  EKG 12-Lead  . EKG 12-Lead  . Repeat EKG  . Repeat EKG  . EKG 12-Lead  . EKG 12-Lead  . EKG    ASSESSMENT AND PLAN:   LindaIsleyis a 76 y.o.femalewith a known history of Anxiety, COPD, depression, Diverticulosis, HLD, IBS- had  colonoscopy and Endoscopy by Dr. Tiffany Kocher done 2-3 months ago- no abnormalities found. Had UTI , came to ER last week- given keflex- and also had diarrhea- after checking GI panel- sent home from ER Cont to have abd pain, Nausea, not eating well, chills, diarrhea, so brought to ER.  * Acute NSTEMI -Seen By cardiology Dr Ubaldo Glassing. 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 Repeat urinalysis normal. Antibiotics stopped now.  * Eneteropathogenic E Coli in stool Off abx Recent Colonoscopy done, As per her Entocort given and tapered . Restarted entocort. Repeat stool studies normal. F/U with Dr. Vira Agar in 1 week  * Afib On cardizem and Started on Eliquis  * Hyperlipidemia Cont statin  * COPD exacerbation. Started steroids. Nebulizers. Orderedincentive spirometer. Oxygen as needed. Chest x-ray shows chronic changes. Nothing acute.  Patient stable for discharge to SNF     All the records are reviewed and case discussed with Care Management/Social Workerr. Management plans discussed with the patient, family and they are in agreement.  CODE STATUS: Full Code  TOTAL TIME TAKING CARE OF THIS PATIENT: 37 minutes.   POSSIBLE D/C TODAY, DEPENDING ON CLINICAL CONDITION.   Jodell Weitman M.D on 04/25/2017 at 9:16 AM  Between 7am to 6pm - Pager - [phone]  After 6pm go to [email or web address] - password [email or web address] [facility]  Office  [phone]  CC: Primary care physician; [name], MD

## 2017-04-25 NOTE — Plan of Care (Signed)
Problem: Safety: Goal: Ability to remain free from injury will improve Outcome: Progressing No complaints of pain this shift, continued contact/droplet precautions. Pt up to [facility] with 1 assist tolerated well.

## 2017-04-25 NOTE — Clinical Social Work Note (Addendum)
CSW received phone call from [facility], insurance has approved patient for SNF placement.  Patient to be d/c'ed today to [facility] [place].  Patient and family agreeable to plans will transport via daughter's car RN to call report to room [place] [phone].  Evette Cristal, MSW, [place]

## 2017-04-29 DIAGNOSIS — M6281 Muscle weakness (generalized): Secondary | ICD-10-CM | POA: Diagnosis not present

## 2017-04-29 DIAGNOSIS — M069 Rheumatoid arthritis, unspecified: Secondary | ICD-10-CM | POA: Diagnosis not present

## 2017-04-29 DIAGNOSIS — K5289 Other specified noninfective gastroenteritis and colitis: Secondary | ICD-10-CM | POA: Diagnosis not present

## 2017-04-29 DIAGNOSIS — E785 Hyperlipidemia, unspecified: Secondary | ICD-10-CM | POA: Diagnosis not present

## 2017-04-29 DIAGNOSIS — F419 Anxiety disorder, unspecified: Secondary | ICD-10-CM | POA: Diagnosis not present

## 2017-04-29 DIAGNOSIS — I10 Essential (primary) hypertension: Secondary | ICD-10-CM | POA: Diagnosis not present

## 2017-04-29 DIAGNOSIS — F329 Major depressive disorder, single episode, unspecified: Secondary | ICD-10-CM | POA: Diagnosis not present

## 2017-04-29 DIAGNOSIS — Z7901 Long term (current) use of anticoagulants: Secondary | ICD-10-CM | POA: Diagnosis not present

## 2017-04-29 DIAGNOSIS — I2510 Atherosclerotic heart disease of native coronary artery without angina pectoris: Secondary | ICD-10-CM | POA: Diagnosis not present

## 2017-04-30 ENCOUNTER — Other Ambulatory Visit
Admission: RE | Admit: 2017-04-30 | Discharge: 2017-04-30 | Disposition: A | Discharge status: Home / Self Care | Payer: No Typology Code available for payment source | Source: Ambulatory Visit | Attending: Nurse Practitioner | Admitting: Nurse Practitioner

## 2017-04-30 DIAGNOSIS — R197 Diarrhea, unspecified: Secondary | ICD-10-CM | POA: Insufficient documentation

## 2017-04-30 DIAGNOSIS — R0989 Other specified symptoms and signs involving the circulatory and respiratory systems: Secondary | ICD-10-CM | POA: Diagnosis not present

## 2017-04-30 DIAGNOSIS — R05 Cough: Secondary | ICD-10-CM | POA: Diagnosis not present

## 2017-04-30 LAB — GASTROINTESTINAL PANEL BY PCR, STOOL (REPLACES STOOL CULTURE)

## 2017-04-30 LAB — C DIFFICILE QUICK SCREEN W PCR REFLEX
C Diff antigen: NEGATIVE
C Diff interpretation: NOT DETECTED
C Diff toxin: NEGATIVE

## 2017-05-02 DIAGNOSIS — I480 Paroxysmal atrial fibrillation: Secondary | ICD-10-CM | POA: Diagnosis not present

## 2017-05-02 DIAGNOSIS — I10 Essential (primary) hypertension: Secondary | ICD-10-CM | POA: Diagnosis not present

## 2017-05-02 DIAGNOSIS — E7800 Pure hypercholesterolemia, unspecified: Secondary | ICD-10-CM | POA: Diagnosis not present

## 2017-05-02 DIAGNOSIS — I4891 Unspecified atrial fibrillation: Secondary | ICD-10-CM | POA: Diagnosis not present

## 2017-05-02 DIAGNOSIS — R748 Abnormal levels of other serum enzymes: Secondary | ICD-10-CM | POA: Diagnosis not present

## 2017-05-05 DIAGNOSIS — Z7982 Long term (current) use of aspirin: Secondary | ICD-10-CM | POA: Diagnosis not present

## 2017-05-05 DIAGNOSIS — Z7901 Long term (current) use of anticoagulants: Secondary | ICD-10-CM | POA: Diagnosis not present

## 2017-05-05 DIAGNOSIS — M069 Rheumatoid arthritis, unspecified: Secondary | ICD-10-CM | POA: Diagnosis not present

## 2017-05-05 DIAGNOSIS — F329 Major depressive disorder, single episode, unspecified: Secondary | ICD-10-CM | POA: Diagnosis not present

## 2017-05-05 DIAGNOSIS — F419 Anxiety disorder, unspecified: Secondary | ICD-10-CM | POA: Diagnosis not present

## 2017-05-05 DIAGNOSIS — E785 Hyperlipidemia, unspecified: Secondary | ICD-10-CM | POA: Diagnosis not present

## 2017-05-05 DIAGNOSIS — I2510 Atherosclerotic heart disease of native coronary artery without angina pectoris: Secondary | ICD-10-CM | POA: Diagnosis not present

## 2017-05-05 DIAGNOSIS — K5289 Other specified noninfective gastroenteritis and colitis: Secondary | ICD-10-CM | POA: Diagnosis not present

## 2017-05-05 DIAGNOSIS — I10 Essential (primary) hypertension: Secondary | ICD-10-CM | POA: Diagnosis not present

## 2017-05-09 ENCOUNTER — Other Ambulatory Visit: Payer: Self-pay

## 2017-05-09 DIAGNOSIS — J449 Chronic obstructive pulmonary disease, unspecified: Secondary | ICD-10-CM | POA: Diagnosis not present

## 2017-05-09 DIAGNOSIS — K529 Noninfective gastroenteritis and colitis, unspecified: Secondary | ICD-10-CM | POA: Diagnosis not present

## 2017-05-09 DIAGNOSIS — R2689 Other abnormalities of gait and mobility: Secondary | ICD-10-CM | POA: Diagnosis not present

## 2017-05-09 DIAGNOSIS — D509 Iron deficiency anemia, unspecified: Secondary | ICD-10-CM | POA: Diagnosis not present

## 2017-05-09 DIAGNOSIS — I2510 Atherosclerotic heart disease of native coronary artery without angina pectoris: Secondary | ICD-10-CM | POA: Diagnosis not present

## 2017-05-09 DIAGNOSIS — E559 Vitamin D deficiency, unspecified: Secondary | ICD-10-CM | POA: Diagnosis not present

## 2017-05-09 DIAGNOSIS — I4891 Unspecified atrial fibrillation: Secondary | ICD-10-CM | POA: Diagnosis not present

## 2017-05-09 DIAGNOSIS — I10 Essential (primary) hypertension: Secondary | ICD-10-CM | POA: Diagnosis not present

## 2017-05-09 DIAGNOSIS — I214 Non-ST elevation (NSTEMI) myocardial infarction: Secondary | ICD-10-CM | POA: Diagnosis not present

## 2017-05-09 NOTE — Patient Outreach (Signed)
[facility]) Care Management  05/09/2017  CHAITRA MAST 1940-10-23 [id number]     Transition of Care Referral  Referral Date: 05/09/17 Referral Source: [facility] Discharge Report Date of Admission: [facility] 04/19/17-04/25/17 Diagnosis: dehydration, colitits Date of Discharge: 05/08/17 Facility: [facility] Insurance: [employer]    Outreach attempt # 1 to patient. Spoke with patient. She states she is doing fairly well since return home on yesterday. Patient states she has supportive spouse and children who are assisting her as needed. She has completed most of her MD f/u appts while at facility. Patient voices that she has picked up all her meds. However, she voices that two of her meds(Eliquis and Budesonide) are very expensive and she is unsure how she will be able to afford these meds long term. She states that she is on a fixed income and her spouse also takes Eliquis as well. Patient agreeable to [facility] pharmacy referral for possible med assistance. She denies any other needs or concerns at this time. She is aware of when and how to contact MDs for changes in her condition and when to seek medical attention. She was appreciative of f/u call.      Plan: RN CM will send [facility] pharmacy referral for possible med assistance. RN CM will notify [facility] of case status.    Enzo Montgomery, RN,BSN,CCM [place] Management Telephonic Care Management Coordinator Direct Phone: [phone] Toll Free: [phone] Fax: [phone]

## 2017-05-10 DIAGNOSIS — I10 Essential (primary) hypertension: Secondary | ICD-10-CM | POA: Diagnosis not present

## 2017-05-10 DIAGNOSIS — R2689 Other abnormalities of gait and mobility: Secondary | ICD-10-CM | POA: Diagnosis not present

## 2017-05-10 DIAGNOSIS — D509 Iron deficiency anemia, unspecified: Secondary | ICD-10-CM | POA: Diagnosis not present

## 2017-05-10 DIAGNOSIS — I214 Non-ST elevation (NSTEMI) myocardial infarction: Secondary | ICD-10-CM | POA: Diagnosis not present

## 2017-05-10 DIAGNOSIS — J449 Chronic obstructive pulmonary disease, unspecified: Secondary | ICD-10-CM | POA: Diagnosis not present

## 2017-05-10 DIAGNOSIS — E559 Vitamin D deficiency, unspecified: Secondary | ICD-10-CM | POA: Diagnosis not present

## 2017-05-10 DIAGNOSIS — K529 Noninfective gastroenteritis and colitis, unspecified: Secondary | ICD-10-CM | POA: Diagnosis not present

## 2017-05-10 DIAGNOSIS — I2510 Atherosclerotic heart disease of native coronary artery without angina pectoris: Secondary | ICD-10-CM | POA: Diagnosis not present

## 2017-05-10 DIAGNOSIS — I4891 Unspecified atrial fibrillation: Secondary | ICD-10-CM | POA: Diagnosis not present

## 2017-05-15 ENCOUNTER — Other Ambulatory Visit: Payer: Self-pay | Admitting: Pharmacist

## 2017-05-15 DIAGNOSIS — R2689 Other abnormalities of gait and mobility: Secondary | ICD-10-CM | POA: Diagnosis not present

## 2017-05-15 DIAGNOSIS — I4891 Unspecified atrial fibrillation: Secondary | ICD-10-CM | POA: Diagnosis not present

## 2017-05-15 DIAGNOSIS — E559 Vitamin D deficiency, unspecified: Secondary | ICD-10-CM | POA: Diagnosis not present

## 2017-05-15 DIAGNOSIS — I214 Non-ST elevation (NSTEMI) myocardial infarction: Secondary | ICD-10-CM | POA: Diagnosis not present

## 2017-05-15 DIAGNOSIS — I10 Essential (primary) hypertension: Secondary | ICD-10-CM | POA: Diagnosis not present

## 2017-05-15 DIAGNOSIS — K529 Noninfective gastroenteritis and colitis, unspecified: Secondary | ICD-10-CM | POA: Diagnosis not present

## 2017-05-15 DIAGNOSIS — D509 Iron deficiency anemia, unspecified: Secondary | ICD-10-CM | POA: Diagnosis not present

## 2017-05-15 DIAGNOSIS — J449 Chronic obstructive pulmonary disease, unspecified: Secondary | ICD-10-CM | POA: Diagnosis not present

## 2017-05-15 DIAGNOSIS — I2510 Atherosclerotic heart disease of native coronary artery without angina pectoris: Secondary | ICD-10-CM | POA: Diagnosis not present

## 2017-05-15 NOTE — Addendum Note (Signed)
Addended by: Ralene Bathe E on: 05/15/2017 12:21 PM   Modules accepted: Orders

## 2017-05-15 NOTE — Patient Outreach (Addendum)
[facility]) Care Management  [place]   05/15/2017  Melissa [name] [birth year] [id number]  76 year old female referred to [place] [facility].  [place] services requested for medication assistance with apixaban and budesonide.  PMHx includes, but not limited to, Raynaud's disease, IBS, GERD, diverticulosis, rheumatoid arthritis, anxiety, depression, HTN, HLD, COPD, and osteoarthritis.   Noted recent admission 04/19/17-04/25/17 for colitis, acute NSTEMI treated with medical management, and PAF started on apixaban (CHADS2VASc =5) .  Patient discharged to SNF through 05/08/2017 and is now back home.    Unsuccessful telephone call to Melissa [name] today.  I left a HIPPA compliant voicemail on the home phone.    Plan: I will follow-up with Melissa [name] later this week regarding medication assistance.   Ralene Bathe, PharmD, [place] [phone]    Addendum:  Subjective:  10:51AM Incoming call and voicemail received from Melissa [name]. HIPAA identifiers verified. Melissa [name] reports [facility] she needs to pick up right away but otherwise uses [facility].  She is familiar with the [employer] OTC program.  Melissa [name] reports her estimated monthly income = $3,406 ([phone] SSI for herself + $1422 SSI from spouse + $30,222 pension from spouse). She reports she recently paid ~$500 for a 30 day supply of apixaban and budesonide.     Objective: SCr = 0.87, CrCl = ~52 ml/min  Medications Reviewed Today    Reviewed by Rudean Haskell, RPH (Pharmacist) on 05/15/17 at 1129  Med List Status: <None>  Medication Order Taking? Sig Documenting Provider Last Dose Status Informant  apixaban (ELIQUIS) 5 MG TABS tablet [id number] Yes Take 1 tablet (5 mg total) by mouth 2 (two) times daily. Hillary Bow, MD Taking Active   aspirin EC 81 MG tablet [id number] Yes Take 1 tablet (81 mg total) by mouth daily. Hillary Bow, MD  Taking Active   budesonide (ENTOCORT EC) 3 MG 24 hr capsule [id number] Yes Take 9 mg by mouth daily.  [provider] Taking Active Self  Calcium Carbonate-Vitamin D (CALCIUM 600+D) 600-400 MG-UNIT tablet [id number] Yes Take 1 tablet by mouth 2 (two) times daily. [provider] Taking Active Self  diltiazem (CARDIZEM CD) 240 MG 24 hr capsule [id number] Yes Take 240 mg by mouth daily. [provider] Taking Active Self        Discontinued 05/15/17 1126 (Change in therapy)   ferrous sulfate 325 (65 FE) MG tablet [id number] Yes Take 325 mg by mouth daily with breakfast. [provider] Taking Active Self        Discontinued 05/15/17 1127 (Completed Course)   gemfibrozil (LOPID) 600 MG tablet [id number] Yes Take 600 mg by mouth 2 (two) times daily before a meal. [provider] Taking Active Self  hydroxychloroquine (PLAQUENIL) 200 MG tablet [id number] Yes Take 200 mg by mouth 2 (two) times daily.  [provider] Taking Active Self  leflunomide (ARAVA) 20 MG tablet [id number] Yes Take 20 mg by mouth daily.  [provider] Taking Active Self  LORazepam (ATIVAN) 1 MG tablet [id number] Yes Take 1 tablet (1 mg total) by mouth 2 (two) times daily as needed for anxiety. Hillary Bow, MD Taking Active   losartan (COZAAR) 100 MG tablet [id number] Yes Take 100 mg by mouth daily. [provider] Taking Active Self  Multiple Vitamin (MULTIVITAMIN) tablet [id number] Yes Take 1 tablet by mouth daily. [provider] Taking Active Self  Multiple Vitamins-Minerals (PRESERVISION AREDS) CAPS  [id number] Yes Take 1 capsule by mouth 2 (two) times daily. [provider] Taking Active Self        Discontinued 05/15/17 1128 (Completed Course)   omeprazole (PRILOSEC) 20 MG capsule [id number] Yes Take 20 mg by mouth 2 (two) times daily before a meal.  [provider] Taking Active Self  potassium chloride SA (K-DUR,KLOR-CON) 20 MEQ tablet  [id number] Yes Take 1 tablet (20 mEq total) by mouth daily. Hillary Bow, MD Taking Active   pravastatin (PRAVACHOL) 80 MG tablet [id number] Yes Take 80 mg by mouth at bedtime.  [provider] Taking Active Self  predniSONE (DELTASONE) 5 MG tablet [id number] Yes Take 5 mg by mouth daily.  [provider] Taking Active Self  sertraline (ZOLOFT) 50 MG tablet [id number] Yes Take 50 mg by mouth at bedtime.  [provider] Taking Active Self         Assessment: Medication review performed telephonically.   Drugs sorted by system:  Neurologic/Psychologic: lorazepam, sertraline  Cardiovascular: aspirin 4m, apixaban, diltiazem, gemfibrozil, losartan, pravastatin, potassium  Gastrointestinal:budesonide, omeprazole  Endocrine:prednisone  Vitamins/Minerals:calcium carbonate -vitamin D, ferrous sulfate, MVI, preservision  Miscellaneous:hydroxychloroquine, leflunomide  Other issues noted:  Concomitant use of gemfibrozil and pravastatin increases the risk of myopathy and rhabdomyolysis.  Monitor closely for signs and symptoms and adjust therapy as warranted.   I counseled Melissa [name] on apixaban including signs and symptoms of bleeding and how to identify a stroke using FAST acryonym.    I removed nystatin, lomotil, and furosemide from medication list as patient reports she is no longer taking these.   Medication assistance:  Patient does not meet income requirements for LIS / Extra help based off monthly income of $3,406.   Patient preliminarily meets requirements for PAPs for apixaban ([employer]) and budesonide ([employer])  Patient will look for financial documents as proof of income and [employer] EOB statements for [employer].    Plan: I will route patient assistance letter to pharmacy technician, AEtter Sjogren who will coordinate application process for apixaban (Eliquis) with Dr. FUbaldo Glassingand budesonide  (Entocort) with Dr. EVira Agar assist with obtaining all pertinent  documents from both patient and provider and submit application once completed.    CRalene Bathe PharmD, [phone]

## 2017-05-16 ENCOUNTER — Other Ambulatory Visit: Payer: Self-pay | Admitting: Pharmacy Technician

## 2017-05-17 ENCOUNTER — Ambulatory Visit: Payer: Self-pay | Admitting: Pharmacist

## 2017-05-17 DIAGNOSIS — K529 Noninfective gastroenteritis and colitis, unspecified: Secondary | ICD-10-CM | POA: Diagnosis not present

## 2017-05-17 DIAGNOSIS — I214 Non-ST elevation (NSTEMI) myocardial infarction: Secondary | ICD-10-CM | POA: Diagnosis not present

## 2017-05-17 DIAGNOSIS — E559 Vitamin D deficiency, unspecified: Secondary | ICD-10-CM | POA: Diagnosis not present

## 2017-05-17 DIAGNOSIS — R2689 Other abnormalities of gait and mobility: Secondary | ICD-10-CM | POA: Diagnosis not present

## 2017-05-17 DIAGNOSIS — D509 Iron deficiency anemia, unspecified: Secondary | ICD-10-CM | POA: Diagnosis not present

## 2017-05-17 DIAGNOSIS — I4891 Unspecified atrial fibrillation: Secondary | ICD-10-CM | POA: Diagnosis not present

## 2017-05-17 DIAGNOSIS — J449 Chronic obstructive pulmonary disease, unspecified: Secondary | ICD-10-CM | POA: Diagnosis not present

## 2017-05-17 DIAGNOSIS — I10 Essential (primary) hypertension: Secondary | ICD-10-CM | POA: Diagnosis not present

## 2017-05-17 DIAGNOSIS — I2510 Atherosclerotic heart disease of native coronary artery without angina pectoris: Secondary | ICD-10-CM | POA: Diagnosis not present

## 2017-05-18 ENCOUNTER — Other Ambulatory Visit: Payer: Self-pay | Admitting: Pharmacy Technician

## 2017-05-18 DIAGNOSIS — J449 Chronic obstructive pulmonary disease, unspecified: Secondary | ICD-10-CM | POA: Diagnosis not present

## 2017-05-18 DIAGNOSIS — I214 Non-ST elevation (NSTEMI) myocardial infarction: Secondary | ICD-10-CM | POA: Diagnosis not present

## 2017-05-18 DIAGNOSIS — D509 Iron deficiency anemia, unspecified: Secondary | ICD-10-CM | POA: Diagnosis not present

## 2017-05-18 DIAGNOSIS — R2689 Other abnormalities of gait and mobility: Secondary | ICD-10-CM | POA: Diagnosis not present

## 2017-05-18 DIAGNOSIS — I10 Essential (primary) hypertension: Secondary | ICD-10-CM | POA: Diagnosis not present

## 2017-05-18 DIAGNOSIS — K529 Noninfective gastroenteritis and colitis, unspecified: Secondary | ICD-10-CM | POA: Diagnosis not present

## 2017-05-18 DIAGNOSIS — E559 Vitamin D deficiency, unspecified: Secondary | ICD-10-CM | POA: Diagnosis not present

## 2017-05-18 DIAGNOSIS — I2510 Atherosclerotic heart disease of native coronary artery without angina pectoris: Secondary | ICD-10-CM | POA: Diagnosis not present

## 2017-05-18 DIAGNOSIS — I4891 Unspecified atrial fibrillation: Secondary | ICD-10-CM | POA: Diagnosis not present

## 2017-05-18 NOTE — Patient Outreach (Signed)
[facility]) Care Management  05/18/2017  Melissa [name] 09/20/1940 [id number]   Contacted patient to inform her that I had mailed out Patient assistance applications for Eliquis and Entocort on 05/16/17. Verified HIPAA information, patient stated that she had not received the applications yet and that she would be on the look out for them. I also informed patient that I would need a copy of her insurance card. Patient stated that [facility] is going to be sending her a copy of what she has spent on her medications. Patient will follow up with me when she mails her application back into myself or if she has any questions before then.  Maud Deed W[place], [place] Management [phone]

## 2017-05-21 DIAGNOSIS — Z79899 Other long term (current) drug therapy: Secondary | ICD-10-CM | POA: Diagnosis not present

## 2017-05-21 DIAGNOSIS — I10 Essential (primary) hypertension: Secondary | ICD-10-CM | POA: Diagnosis not present

## 2017-05-21 DIAGNOSIS — I480 Paroxysmal atrial fibrillation: Secondary | ICD-10-CM | POA: Diagnosis not present

## 2017-05-21 DIAGNOSIS — E860 Dehydration: Secondary | ICD-10-CM | POA: Diagnosis not present

## 2017-05-21 DIAGNOSIS — N390 Urinary tract infection, site not specified: Secondary | ICD-10-CM | POA: Diagnosis not present

## 2017-05-21 DIAGNOSIS — J410 Simple chronic bronchitis: Secondary | ICD-10-CM | POA: Diagnosis not present

## 2017-05-22 DIAGNOSIS — K529 Noninfective gastroenteritis and colitis, unspecified: Secondary | ICD-10-CM | POA: Diagnosis not present

## 2017-05-22 DIAGNOSIS — I214 Non-ST elevation (NSTEMI) myocardial infarction: Secondary | ICD-10-CM | POA: Diagnosis not present

## 2017-05-22 DIAGNOSIS — I10 Essential (primary) hypertension: Secondary | ICD-10-CM | POA: Diagnosis not present

## 2017-05-22 DIAGNOSIS — E559 Vitamin D deficiency, unspecified: Secondary | ICD-10-CM | POA: Diagnosis not present

## 2017-05-22 DIAGNOSIS — I4891 Unspecified atrial fibrillation: Secondary | ICD-10-CM | POA: Diagnosis not present

## 2017-05-22 DIAGNOSIS — I2510 Atherosclerotic heart disease of native coronary artery without angina pectoris: Secondary | ICD-10-CM | POA: Diagnosis not present

## 2017-05-22 DIAGNOSIS — D509 Iron deficiency anemia, unspecified: Secondary | ICD-10-CM | POA: Diagnosis not present

## 2017-05-22 DIAGNOSIS — R2689 Other abnormalities of gait and mobility: Secondary | ICD-10-CM | POA: Diagnosis not present

## 2017-05-22 DIAGNOSIS — J449 Chronic obstructive pulmonary disease, unspecified: Secondary | ICD-10-CM | POA: Diagnosis not present

## 2017-05-24 DIAGNOSIS — I10 Essential (primary) hypertension: Secondary | ICD-10-CM | POA: Diagnosis not present

## 2017-05-24 DIAGNOSIS — D509 Iron deficiency anemia, unspecified: Secondary | ICD-10-CM | POA: Diagnosis not present

## 2017-05-24 DIAGNOSIS — K529 Noninfective gastroenteritis and colitis, unspecified: Secondary | ICD-10-CM | POA: Diagnosis not present

## 2017-05-24 DIAGNOSIS — I2510 Atherosclerotic heart disease of native coronary artery without angina pectoris: Secondary | ICD-10-CM | POA: Diagnosis not present

## 2017-05-24 DIAGNOSIS — J449 Chronic obstructive pulmonary disease, unspecified: Secondary | ICD-10-CM | POA: Diagnosis not present

## 2017-05-24 DIAGNOSIS — R2689 Other abnormalities of gait and mobility: Secondary | ICD-10-CM | POA: Diagnosis not present

## 2017-05-24 DIAGNOSIS — I4891 Unspecified atrial fibrillation: Secondary | ICD-10-CM | POA: Diagnosis not present

## 2017-05-24 DIAGNOSIS — I214 Non-ST elevation (NSTEMI) myocardial infarction: Secondary | ICD-10-CM | POA: Diagnosis not present

## 2017-05-24 DIAGNOSIS — E559 Vitamin D deficiency, unspecified: Secondary | ICD-10-CM | POA: Diagnosis not present

## 2017-05-27 DIAGNOSIS — I10 Essential (primary) hypertension: Secondary | ICD-10-CM | POA: Diagnosis not present

## 2017-05-27 DIAGNOSIS — K529 Noninfective gastroenteritis and colitis, unspecified: Secondary | ICD-10-CM | POA: Diagnosis not present

## 2017-05-27 DIAGNOSIS — E559 Vitamin D deficiency, unspecified: Secondary | ICD-10-CM | POA: Diagnosis not present

## 2017-05-27 DIAGNOSIS — J449 Chronic obstructive pulmonary disease, unspecified: Secondary | ICD-10-CM | POA: Diagnosis not present

## 2017-05-27 DIAGNOSIS — I2510 Atherosclerotic heart disease of native coronary artery without angina pectoris: Secondary | ICD-10-CM | POA: Diagnosis not present

## 2017-05-27 DIAGNOSIS — I214 Non-ST elevation (NSTEMI) myocardial infarction: Secondary | ICD-10-CM | POA: Diagnosis not present

## 2017-05-27 DIAGNOSIS — R2689 Other abnormalities of gait and mobility: Secondary | ICD-10-CM | POA: Diagnosis not present

## 2017-05-27 DIAGNOSIS — D509 Iron deficiency anemia, unspecified: Secondary | ICD-10-CM | POA: Diagnosis not present

## 2017-05-27 DIAGNOSIS — I4891 Unspecified atrial fibrillation: Secondary | ICD-10-CM | POA: Diagnosis not present

## 2017-05-28 DIAGNOSIS — I4891 Unspecified atrial fibrillation: Secondary | ICD-10-CM | POA: Diagnosis not present

## 2017-05-28 DIAGNOSIS — D509 Iron deficiency anemia, unspecified: Secondary | ICD-10-CM | POA: Diagnosis not present

## 2017-05-28 DIAGNOSIS — R2689 Other abnormalities of gait and mobility: Secondary | ICD-10-CM | POA: Diagnosis not present

## 2017-05-28 DIAGNOSIS — I10 Essential (primary) hypertension: Secondary | ICD-10-CM | POA: Diagnosis not present

## 2017-05-28 DIAGNOSIS — I214 Non-ST elevation (NSTEMI) myocardial infarction: Secondary | ICD-10-CM | POA: Diagnosis not present

## 2017-05-28 DIAGNOSIS — E559 Vitamin D deficiency, unspecified: Secondary | ICD-10-CM | POA: Diagnosis not present

## 2017-05-28 DIAGNOSIS — J449 Chronic obstructive pulmonary disease, unspecified: Secondary | ICD-10-CM | POA: Diagnosis not present

## 2017-05-28 DIAGNOSIS — K529 Noninfective gastroenteritis and colitis, unspecified: Secondary | ICD-10-CM | POA: Diagnosis not present

## 2017-05-28 DIAGNOSIS — I2510 Atherosclerotic heart disease of native coronary artery without angina pectoris: Secondary | ICD-10-CM | POA: Diagnosis not present

## 2017-05-30 DIAGNOSIS — I4891 Unspecified atrial fibrillation: Secondary | ICD-10-CM | POA: Diagnosis not present

## 2017-05-30 DIAGNOSIS — E559 Vitamin D deficiency, unspecified: Secondary | ICD-10-CM | POA: Diagnosis not present

## 2017-05-30 DIAGNOSIS — J449 Chronic obstructive pulmonary disease, unspecified: Secondary | ICD-10-CM | POA: Diagnosis not present

## 2017-05-30 DIAGNOSIS — I214 Non-ST elevation (NSTEMI) myocardial infarction: Secondary | ICD-10-CM | POA: Diagnosis not present

## 2017-05-30 DIAGNOSIS — I2510 Atherosclerotic heart disease of native coronary artery without angina pectoris: Secondary | ICD-10-CM | POA: Diagnosis not present

## 2017-05-30 DIAGNOSIS — I10 Essential (primary) hypertension: Secondary | ICD-10-CM | POA: Diagnosis not present

## 2017-05-30 DIAGNOSIS — R2689 Other abnormalities of gait and mobility: Secondary | ICD-10-CM | POA: Diagnosis not present

## 2017-05-30 DIAGNOSIS — D509 Iron deficiency anemia, unspecified: Secondary | ICD-10-CM | POA: Diagnosis not present

## 2017-05-30 DIAGNOSIS — K529 Noninfective gastroenteritis and colitis, unspecified: Secondary | ICD-10-CM | POA: Diagnosis not present

## 2017-05-31 ENCOUNTER — Ambulatory Visit: Payer: Self-pay | Admitting: Pharmacist

## 2017-06-04 ENCOUNTER — Ambulatory Visit: Payer: Self-pay | Admitting: Pharmacist

## 2017-06-04 ENCOUNTER — Other Ambulatory Visit: Payer: Self-pay | Admitting: Pharmacist

## 2017-06-04 NOTE — Patient Outreach (Signed)
[facility] [facility]) Care Management  06/04/2017  YELINA SARRATT [birth year] [id number]   Successful call placed to Ms. Tailor today. HIPAA identifiers verified. Ms. Maule reports she completed her PAP applications and mailed them back to [facility] on Friday, November 16th.  Ms. Cawley reports she included a copy of her current [employer] card, EOB, and 2018 taxes.  I reviewed with Ms. Fleece that depending on when the applications are approved, it may be for 2019 rather than 2018 which means she would not need to re-apply next year.  Ms. Vandevender voiced understanding.    Plan: I will route my note to [place] technician Etter Sjogren.  I will follow-up with Ms. Morandi after applications are received and faxed to [employer] and [employer].   Ralene Bathe, PharmD, [place] [phone]

## 2017-06-05 ENCOUNTER — Ambulatory Visit (INDEPENDENT_AMBULATORY_CARE_PROVIDER_SITE_OTHER): Payer: Medicare HMO | Admitting: Urology

## 2017-06-05 ENCOUNTER — Encounter: Payer: Self-pay | Admitting: Urology

## 2017-06-05 VITALS — BP 130/63 | HR 87 | Ht 64.0 in | Wt 125.2 lb

## 2017-06-05 DIAGNOSIS — N390 Urinary tract infection, site not specified: Secondary | ICD-10-CM

## 2017-06-05 LAB — URINALYSIS, COMPLETE
Bilirubin, UA: NEGATIVE
Glucose, UA: NEGATIVE
Ketones, UA: NEGATIVE
Leukocytes, UA: NEGATIVE
Nitrite, UA: NEGATIVE
PH UA: 6 (ref 5.0–7.5)
RBC, UA: NEGATIVE
Specific Gravity, UA: >1.03 — ABNORMAL HIGH (ref 1.005–1.030)
UUROB: 0.2 mg/dL (ref 0.2–1.0)

## 2017-06-05 LAB — MICROSCOPIC EXAMINATION
RBC, UA: NONE SEEN /hpf (ref 0–?)
WBC, UA: NONE SEEN /hpf (ref 0–?)

## 2017-06-05 LAB — BLADDER SCAN AMB NON-IMAGING

## 2017-06-08 ENCOUNTER — Encounter: Payer: Self-pay | Admitting: Emergency Medicine

## 2017-06-08 ENCOUNTER — Other Ambulatory Visit: Payer: Self-pay

## 2017-06-08 ENCOUNTER — Emergency Department: Payer: Medicare HMO

## 2017-06-08 ENCOUNTER — Emergency Department
Admission: EM | Admit: 2017-06-08 | Discharge: 2017-06-08 | Disposition: A | Discharge status: Home / Self Care | Payer: Medicare HMO | Attending: Emergency Medicine | Admitting: Emergency Medicine

## 2017-06-08 DIAGNOSIS — J449 Chronic obstructive pulmonary disease, unspecified: Secondary | ICD-10-CM | POA: Insufficient documentation

## 2017-06-08 DIAGNOSIS — M542 Cervicalgia: Secondary | ICD-10-CM | POA: Diagnosis not present

## 2017-06-08 DIAGNOSIS — W19XXXA Unspecified fall, initial encounter: Secondary | ICD-10-CM

## 2017-06-08 DIAGNOSIS — S199XXA Unspecified injury of neck, initial encounter: Secondary | ICD-10-CM | POA: Diagnosis not present

## 2017-06-08 DIAGNOSIS — Z7901 Long term (current) use of anticoagulants: Secondary | ICD-10-CM | POA: Diagnosis not present

## 2017-06-08 DIAGNOSIS — Y939 Activity, unspecified: Secondary | ICD-10-CM | POA: Diagnosis not present

## 2017-06-08 DIAGNOSIS — Z96652 Presence of left artificial knee joint: Secondary | ICD-10-CM | POA: Insufficient documentation

## 2017-06-08 DIAGNOSIS — Z7982 Long term (current) use of aspirin: Secondary | ICD-10-CM | POA: Diagnosis not present

## 2017-06-08 DIAGNOSIS — S0990XA Unspecified injury of head, initial encounter: Secondary | ICD-10-CM | POA: Diagnosis not present

## 2017-06-08 DIAGNOSIS — Z7902 Long term (current) use of antithrombotics/antiplatelets: Secondary | ICD-10-CM | POA: Diagnosis not present

## 2017-06-08 DIAGNOSIS — Y999 Unspecified external cause status: Secondary | ICD-10-CM | POA: Insufficient documentation

## 2017-06-08 DIAGNOSIS — W0110XA Fall on same level from slipping, tripping and stumbling with subsequent striking against unspecified object, initial encounter: Secondary | ICD-10-CM | POA: Insufficient documentation

## 2017-06-08 DIAGNOSIS — Y929 Unspecified place or not applicable: Secondary | ICD-10-CM | POA: Insufficient documentation

## 2017-06-08 DIAGNOSIS — I10 Essential (primary) hypertension: Secondary | ICD-10-CM | POA: Insufficient documentation

## 2017-06-08 DIAGNOSIS — R51 Headache: Secondary | ICD-10-CM | POA: Insufficient documentation

## 2017-06-08 MED ORDER — CYCLOBENZAPRINE HCL 5 MG PO TABS: 5.0000 mg | ORAL_TABLET | Freq: Three times a day (TID) | ORAL | 0 refills | Status: AC | PRN

## 2017-06-08 NOTE — ED Provider Notes (Signed)
[facility] Emergency Department Provider Note  ____________________________________________  Time seen: Approximately 4:01 PM  I have reviewed the triage vital signs and the nursing notes.   HISTORY  Chief Complaint Fall    HPI Melissa [name] is a 76 y.o. female presents to the emergency department with headache and left lateral neck pain after tripping over a threshold earlier today.  Patient reports that she hit her head but did not lose consciousness.  She denies any blurry vision, nausea, vomiting, confusion or disorientation.  Patient is accompanied by her daughter who has noticed no changes in behavior.  Patient is reportedly taking Eliquis as directed.  She denies a history of traumatic brain injury.  Patient's daughter presents to the emergency department for CT scan examination.   Past Medical History:  Diagnosis Date  . Anemia   . Anxiety   . Arthritis    rheumatoid  . Ataxia   . COPD (chronic obstructive pulmonary disease) ([place])   . Depression   . Diverticulosis   . Excessive falling   . GERD (gastroesophageal reflux disease)   . Hyperlipidemia   . Hypertension   . IBS (irritable bowel syndrome)   . Osteoarthritis   . Psoriasis   . Raynaud's disease without gangrene   . Raynaud's disease without gangrene   . Shortness of breath dyspnea    with exertion    Patient Active Problem List   Diagnosis Date Noted  . Colitis 04/19/2017  . UTI due to extended-spectrum beta lactamase (ESBL) producing Escherichia coli 04/19/2017  . Other secondary osteoarthritis of one knee 04/04/2016    Past Surgical History:  Procedure Laterality Date  . ABDOMINAL HYSTERECTOMY    . APPENDECTOMY    . BACK SURGERY     Spinal fusion with rods and screws  . CHOLECYSTECTOMY    . COLONOSCOPY WITH PROPOFOL N/A 01/29/2017   Procedure: COLONOSCOPY WITH PROPOFOL;  Surgeon: Manya Silvas, MD;  Location: [facility] ENDOSCOPY;  Service: Endoscopy;  Laterality: N/A;  .  ESOPHAGOGASTRODUODENOSCOPY (EGD) WITH PROPOFOL N/A 01/29/2017   Procedure: ESOPHAGOGASTRODUODENOSCOPY (EGD) WITH PROPOFOL;  Surgeon: Manya Silvas, MD;  Location: [facility] ENDOSCOPY;  Service: Endoscopy;  Laterality: N/A;  . EYE SURGERY Bilateral    Cataract Extraction with IOL  . KNEE ARTHROSCOPY Left    partial lateral meniscectomy, debridement, excision of Baker's cyst  . LEFT HEART CATH AND CORONARY ANGIOGRAPHY N/A 04/23/2017   Procedure: LEFT HEART CATH AND CORONARY ANGIOGRAPHY;  Surgeon: Teodoro Spray, MD;  Location: [facility] CV LAB;  Service: Cardiovascular;  Laterality: N/A;  . ROTATOR CUFF REPAIR Right   . TOTAL KNEE ARTHROPLASTY Left 04/04/2016   Procedure: TOTAL KNEE ARTHROPLASTY;  Surgeon: Hessie Knows, MD;  Location: [facility] ORS;  Service: Orthopedics;  Laterality: Left;    Prior to Admission medications   Medication Sig Start Date End Date Taking? Authorizing Provider  apixaban (ELIQUIS) 5 MG TABS tablet Take 1 tablet (5 mg total) by mouth 2 (two) times daily. 04/24/17   Hillary Bow, MD  aspirin EC 81 MG tablet Take 1 tablet (81 mg total) by mouth daily. 04/24/17   Hillary Bow, MD  budesonide (ENTOCORT EC) 3 MG 24 hr capsule Take 9 mg by mouth daily.     [provider]  Calcium Carbonate-Vitamin D (CALCIUM 600+D) 600-400 MG-UNIT tablet Take 1 tablet by mouth 2 (two) times daily.    [provider]  cyclobenzaprine (FLEXERIL) 5 MG tablet Take 1 tablet (5 mg total) by mouth  3 (three) times daily as needed for up to 3 days for muscle spasms. 06/08/17 06/11/17  Lannie Fields, PA-C  diltiazem (CARDIZEM CD) 240 MG 24 hr capsule Take 240 mg by mouth daily.    [provider]  ferrous sulfate 325 (65 FE) MG tablet Take 325 mg by mouth daily with breakfast.    [provider]  gemfibrozil (LOPID) 600 MG tablet Take 600 mg by mouth 2 (two) times daily before a meal.    [provider]  hydroxychloroquine (PLAQUENIL) 200 MG tablet Take  200 mg by mouth 2 (two) times daily.     [provider]  leflunomide (ARAVA) 20 MG tablet Take 20 mg by mouth daily.     [provider]  LORazepam (ATIVAN) 1 MG tablet Take 1 tablet (1 mg total) by mouth 2 (two) times daily as needed for anxiety. 04/24/17   Hillary Bow, MD  losartan (COZAAR) 100 MG tablet Take 100 mg by mouth daily.    [provider]  Multiple Vitamin (MULTIVITAMIN) tablet Take 1 tablet by mouth daily.    [provider]  Multiple Vitamins-Minerals (PRESERVISION AREDS) CAPS Take 1 capsule by mouth 2 (two) times daily.    [provider]  omeprazole (PRILOSEC) 20 MG capsule Take 20 mg by mouth 2 (two) times daily before a meal.     [provider]  pravastatin (PRAVACHOL) 80 MG tablet Take 80 mg by mouth at bedtime.     [provider]  predniSONE (DELTASONE) 5 MG tablet Take 5 mg by mouth daily.     [provider]  sertraline (ZOLOFT) 50 MG tablet Take 50 mg by mouth at bedtime.     [provider]    Allergies Lodine [etodolac]; Methotrexate derivatives; Norvasc [amlodipine]; Pentasa [mesalamine er]; Red yeast rice extract; Vioxx [rofecoxib]; Levaquin [levofloxacin]; and Sulfa antibiotics  Family History  Problem Relation Age of Onset  . CAD Mother   . CAD Father   . Breast cancer Neg Hx     Social History Social History   Tobacco Use  . Smoking status: Never Smoker  . Smokeless tobacco: Never Used  Substance Use Topics  . Alcohol use: No  . Drug use: No     Review of Systems  Constitutional: No fever/chills Eyes: No visual changes. No discharge ENT: No upper respiratory complaints. Cardiovascular: no chest pain. Respiratory: no cough. No SOB. Gastrointestinal: No abdominal pain.  No nausea, no vomiting.  No diarrhea.  No constipation. Musculoskeletal: Patient has left lateral neck pain. Skin: Negative for rash, abrasions, lacerations, ecchymosis. Neurological: Patient  has headache. No focal weakness or numbness.   ____________________________________________   PHYSICAL EXAM:  VITAL SIGNS: ED Triage Vitals  Enc Vitals Group     BP 06/08/17 1459 129/65     Pulse Rate 06/08/17 1459 91     Resp 06/08/17 1459 16     Temp 06/08/17 1459 97.8 F (36.6 C)     Temp Source 06/08/17 1459 Oral     SpO2 06/08/17 1459 97 %     Weight 06/08/17 1458 125 lb (56.7 kg)     Height 06/08/17 1458 5' 4"  (1.626 m)     Head Circumference --      Peak Flow --      Pain Score 06/08/17 1458 8     Pain Loc --      Pain Edu? --      Excl. in [place]? --  Constitutional: Alert and oriented. Well appearing and in no acute distress. Eyes: Conjunctivae are normal. PERRL. EOMI. Head: Atraumatic. ENT:      Ears: TMs are pearly bilaterally without bloody effusion.      Nose: No congestion/rhinnorhea.      Mouth/Throat: Mucous membranes are moist.  Neck: No stridor.  Patient has paraspinal muscle tenderness elicited with palpation but no midline spinal tenderness.  She exhibits full range of motion at the neck. Cardiovascular: Normal rate, regular rhythm. Normal S1 and S2.  Good peripheral circulation. Respiratory: Normal respiratory effort without tachypnea or retractions. Lungs CTAB. Good air entry to the bases with no decreased or absent breath sounds. Musculoskeletal: Full range of motion to all extremities. No gross deformities appreciated. Neurologic:  Normal speech and language. No gross focal neurologic deficits are appreciated.  Skin:  Skin is warm, dry and intact. No rash noted. Psychiatric: Mood and affect are normal. Speech and behavior are normal. Patient exhibits appropriate insight and judgement.   ____________________________________________   LABS (all labs ordered are listed, but only abnormal results are displayed)  Labs Reviewed - No data to  display ____________________________________________  EKG   ____________________________________________  RADIOLOGY Unk Pinto, personally viewed and evaluated these images as part of my medical decision making, as well as reviewing the written report by the radiologist.    Ct Head Wo Contrast  Result Date: 06/08/2017 CLINICAL DATA:  It's patient stump old over a door jam hitting head and right arm. Patient on Eliquis. EXAM: CT HEAD WITHOUT CONTRAST CT CERVICAL SPINE WITHOUT CONTRAST TECHNIQUE: Multidetector CT imaging of the head and cervical spine was performed following the standard protocol without intravenous contrast. Multiplanar CT image reconstructions of the cervical spine were also generated. COMPARISON:  C-spine radiograph report from 05/04/2014. FINDINGS: CT HEAD FINDINGS Brain: Mild superficial atrophy with chronic appearing small vessel ischemic disease of periventricular white matter. No acute intracranial hemorrhage, midline shift or edema. No large vascular territory infarct, hydrocephalus nor effacement of the basal cisterns or fourth ventricle. Vascular: No hyperdense vessel or unexpected calcification. Skull: Negative for fracture or focal lesion. Sinuses/Orbits: Bilateral lens replacements. Intact orbits and globes. No acute paranasal sinus disease. Clear mastoids. Other: Left anterior parietal scalp contusion with laceration. CT CERVICAL SPINE FINDINGS Alignment: Intact craniocervical relationship. Maintained cervical lordosis. Grade 1 anterolisthesis likely degenerative in etiology involving C3 on C4 and C7 on T1. Skull base and vertebrae: Erosive change about the odontoid process with large soft tissue pannus may reflect inflammatory arthritic change. No acute vertebral fracture. Soft tissues and spinal canal: No prevertebral fluid or swelling. No visible canal hematoma. Disc levels: There is cervical spondylosis with moderate-to-marked disc space narrowing at all  levels. There is ankylosis of the C5-6 vertebral body with facet ankylosis on the left at this level. There is ankylosis of the C4-5 facets on the right. Upper chest: Negative. Other: Left-greater-than-right extracranial carotid arteriosclerosis. IMPRESSION: 1. Left anterior parietal scalp contusion with laceration. 2. Chronic small vessel ischemic disease. No acute intracranial abnormality. Mild superficial atrophy. 3. Cervical spondylosis without acute cervical spine fracture. 4. Large soft tissue pannus with erosive change about the odontoid process may be secondary to inflammatory arthropathy. 5. Grade 1 anterolisthesis of C3 on C4 and C7 on T1. Electronically Signed   By: Ashley Royalty M.D.   On: 06/08/2017 16:24   Ct Cervical Spine Wo Contrast  Result Date: 06/08/2017 CLINICAL DATA:  It's patient stump old over a door jam hitting head and right  arm. Patient on Eliquis. EXAM: CT HEAD WITHOUT CONTRAST CT CERVICAL SPINE WITHOUT CONTRAST TECHNIQUE: Multidetector CT imaging of the head and cervical spine was performed following the standard protocol without intravenous contrast. Multiplanar CT image reconstructions of the cervical spine were also generated. COMPARISON:  C-spine radiograph report from 05/04/2014. FINDINGS: CT HEAD FINDINGS Brain: Mild superficial atrophy with chronic appearing small vessel ischemic disease of periventricular white matter. No acute intracranial hemorrhage, midline shift or edema. No large vascular territory infarct, hydrocephalus nor effacement of the basal cisterns or fourth ventricle. Vascular: No hyperdense vessel or unexpected calcification. Skull: Negative for fracture or focal lesion. Sinuses/Orbits: Bilateral lens replacements. Intact orbits and globes. No acute paranasal sinus disease. Clear mastoids. Other: Left anterior parietal scalp contusion with laceration. CT CERVICAL SPINE FINDINGS Alignment: Intact craniocervical relationship. Maintained cervical lordosis. Grade 1  anterolisthesis likely degenerative in etiology involving C3 on C4 and C7 on T1. Skull base and vertebrae: Erosive change about the odontoid process with large soft tissue pannus may reflect inflammatory arthritic change. No acute vertebral fracture. Soft tissues and spinal canal: No prevertebral fluid or swelling. No visible canal hematoma. Disc levels: There is cervical spondylosis with moderate-to-marked disc space narrowing at all levels. There is ankylosis of the C5-6 vertebral body with facet ankylosis on the left at this level. There is ankylosis of the C4-5 facets on the right. Upper chest: Negative. Other: Left-greater-than-right extracranial carotid arteriosclerosis. IMPRESSION: 1. Left anterior parietal scalp contusion with laceration. 2. Chronic small vessel ischemic disease. No acute intracranial abnormality. Mild superficial atrophy. 3. Cervical spondylosis without acute cervical spine fracture. 4. Large soft tissue pannus with erosive change about the odontoid process may be secondary to inflammatory arthropathy. 5. Grade 1 anterolisthesis of C3 on C4 and C7 on T1. Electronically Signed   By: Ashley Royalty M.D.   On: 06/08/2017 16:24    ________________________________________    PROCEDURES  Procedure(s) performed:    Procedures    Medications - No data to display   ____________________________________________   INITIAL IMPRESSION / ASSESSMENT AND PLAN / ED COURSE  Pertinent labs & imaging results that were available during my care of the patient were reviewed by me and considered in my medical decision making (see chart for details).  Review of the [place] CSRS was performed in accordance of the [place] prior to dispensing any controlled drugs.     Assessment and Plan: Fall Patient presents to the emergency department after a fall that occurred at approximately 11 AM today.  Neurologic exam and overall physical exam is reassuring.  CT examination of the head and neck revealed  no acute abnormality.  Patient was discharged with low-dose Flexeril advised to take before bedtime.  The patient was advised to follow-up with primary care as needed.  All patient questions were answered.     ____________________________________________  FINAL CLINICAL IMPRESSION(S) / ED DIAGNOSES  Final diagnoses:  Fall, initial encounter      NEW MEDICATIONS STARTED DURING THIS VISIT:  ED Discharge Orders        Ordered    cyclobenzaprine (FLEXERIL) 5 MG tablet  3 times daily PRN     06/08/17 1643          This chart was dictated using voice recognition software/Dragon. Despite best efforts to proofread, errors can occur which can change the meaning. Any change was purely unintentional.    Lannie Fields, PA-C 06/08/17 1728    Arta Silence, MD 06/08/17 [phone]

## 2017-06-08 NOTE — ED Triage Notes (Signed)
States stumbled over door jam and hit head and right arm.  Patient takes Eloquis.

## 2017-06-11 ENCOUNTER — Other Ambulatory Visit: Payer: Self-pay | Admitting: Pharmacy Technician

## 2017-06-11 ENCOUNTER — Ambulatory Visit: Payer: Self-pay | Admitting: Pharmacist

## 2017-06-11 NOTE — Patient Outreach (Signed)
[facility]) Care Management  06/11/2017  YVANNA VIDAS October 05, 1940 [id number]   Successful Outreach call to Mrs. Livas. HIPAA identifiers verified. I informed Mrs. Duddy that I had received her part of her applications on 17/40 and that I have faxed both applications to the companies. I also informed Mrs. Parish that with the holidays that have just passed I would follow up with both companies on 11/28 and inform her of the statuses. Patient understood the next steps of the process.  Maud Deed [place], [place] Management [phone]

## 2017-06-11 NOTE — Progress Notes (Signed)
Note was opened in error.  Due to physician emergency in the operating room, office appointment rescheduled.

## 2017-06-13 ENCOUNTER — Ambulatory Visit: Payer: Self-pay | Admitting: Pharmacy Technician

## 2017-06-14 ENCOUNTER — Ambulatory Visit: Payer: Self-pay | Admitting: Pharmacist

## 2017-06-14 ENCOUNTER — Other Ambulatory Visit: Payer: Self-pay | Admitting: Pharmacist

## 2017-06-14 NOTE — Patient Outreach (Signed)
[facility]) Care Management  06/14/2017  ANTONIO WOODHAMS [birth year] [id number]  Successful outreach call to Ms. Babler today. HIPAA identifiers verified.   Ms. Poblano reports she received a letter from [employer] stating her application for budesonide (entocort) was denied as the company no longer provides assistance at all for this medication.  Ms. Harnden reports she received a call from [employer] stating she was approved for apixaban.  She is awaiting a call back from the company.    We reviewed options for paying for budesonide. Ms. Mccraw reports her co-pays vary from $163 - 285 per month, likely depending on what phase of insurance she is in.  She is not eligible for Extra Help and PAP through manufacturer is not available.    Other options include: [employer] which charges $150 / 90 capsules = 30 day supply. [employer] in 2019.  It is currently closed to new applications.    Plan: [place] technician will follow up with [employer] tomorrow regarding apixaban application.  Ms. Averhart will consider her options for budesonide and let us know what her preference is.   Ralene Bathe, PharmD, [place] [phone]

## 2017-06-15 ENCOUNTER — Other Ambulatory Visit: Payer: Self-pay | Admitting: Pharmacy Technician

## 2017-06-15 NOTE — Patient Outreach (Signed)
[facility]) Care Management  06/15/2017  Melissa [name] 23-Aug-1940 [id number]   Successful outreach call to [name], Patient is Approved for Eliquis until 06/20/2018. Was informed by Lauralyn Primes (representative) that patient will receive a 3 month script.  Successful outreach call to patient. HIPAA identifiers verified. Informed [name] that she was approved to get 3 months of Eliquis medication. Informed [name] she would need to call the drug company ([employer]) on Monday 06/18/17 to set up shipment for the medication. [name] stated that she understood. Also informed [name] to contact me if she has any issues setting up shipment.  Will follow up with [name] 12/13 to verify if patient received the medication.  Maud Deed [place], [place] [facility] [phone]

## 2017-06-18 DIAGNOSIS — M359 Systemic involvement of connective tissue, unspecified: Secondary | ICD-10-CM | POA: Diagnosis not present

## 2017-06-18 DIAGNOSIS — E7800 Pure hypercholesterolemia, unspecified: Secondary | ICD-10-CM | POA: Diagnosis not present

## 2017-06-18 DIAGNOSIS — I7300 Raynaud's syndrome without gangrene: Secondary | ICD-10-CM | POA: Diagnosis not present

## 2017-06-18 DIAGNOSIS — Z79899 Other long term (current) drug therapy: Secondary | ICD-10-CM | POA: Diagnosis not present

## 2017-06-19 ENCOUNTER — Ambulatory Visit: Payer: Medicare HMO | Admitting: Urology

## 2017-06-19 ENCOUNTER — Encounter: Payer: Self-pay | Admitting: Urology

## 2017-06-19 VITALS — BP 137/75 | HR 87 | Ht 64.0 in | Wt 125.0 lb

## 2017-06-19 DIAGNOSIS — N390 Urinary tract infection, site not specified: Secondary | ICD-10-CM | POA: Diagnosis not present

## 2017-06-19 DIAGNOSIS — K580 Irritable bowel syndrome with diarrhea: Secondary | ICD-10-CM | POA: Diagnosis not present

## 2017-06-19 DIAGNOSIS — K529 Noninfective gastroenteritis and colitis, unspecified: Secondary | ICD-10-CM | POA: Diagnosis not present

## 2017-06-19 DIAGNOSIS — N3941 Urge incontinence: Secondary | ICD-10-CM

## 2017-06-19 LAB — BLADDER SCAN AMB NON-IMAGING: SCAN RESULT: 0

## 2017-06-19 MED ORDER — ESTRADIOL 0.1 MG/GM VA CREA: 1.0000 g | TOPICAL_CREAM | VAGINAL | 12 refills | Status: DC

## 2017-06-19 NOTE — Progress Notes (Signed)
06/19/2017 1:20 PM   Melissa [name] 28-Jun-1941 [id number]  Referring provider: [name], MD [name] [place], [place] [address]  Chief Complaint  Patient presents with  . Recurrent UTI    HPI: 76 year old female who presents today for further evaluation of urinary tract infections.  She had an acute severe illness in 04/2017 requiring hospital admission including urinary tract infection, A. fib, MI with a prolonged recovery.  She is now recovering and is under the care of outpatient rehab.  She was first found to have an ESBL E. coli urinary tract infection on 03/07/2017.  She was treated with Macrobid which was not well-tolerated   During her admission, she was diagnosed with ESBL E. coli on 04/13/2017.  She was treated with broad-spectrum IV antibiotics.  At the time, she did have associated urinary symptoms.  Urine culture from 05/23/2017 performed at her primary care physician's office also grew ESBL E. Coli.  Her urinalysis on that date was fairly unremarkable.  It was unclear whether she was symptomatic at this time.  She was initially prescribed Macrobid which was later changed to doxycycline.  Today, she has no significant urinary complaints other than baseline urge urinary incontinence.  She is been wearing a diaper for many years.  She reports that she will suddenly start voiding fairly large volumes which is fairly unpredictable.  This acutely worsened during her hospital admission but seems to be slowly improving back to her baseline.  She denies any dysuria, change of urinary frequency, gross hematuria, abdominal or flank pain today.  PVR today 0.   CT abd/ pelvis 01/2017 without any GU pathology.    PMH: Past Medical History:  Diagnosis Date  . Anemia   . Anxiety   . Arthritis    rheumatoid  . Ataxia   . COPD (chronic obstructive pulmonary disease) ([place])   . Depression   . Diverticulosis   . Excessive falling   . GERD  (gastroesophageal reflux disease)   . Hyperlipidemia   . Hypertension   . IBS (irritable bowel syndrome)   . Osteoarthritis   . Psoriasis   . Raynaud's disease without gangrene   . Raynaud's disease without gangrene   . Shortness of breath dyspnea    with exertion    Surgical History: Past Surgical History:  Procedure Laterality Date  . ABDOMINAL HYSTERECTOMY    . APPENDECTOMY    . BACK SURGERY     Spinal fusion with rods and screws  . CHOLECYSTECTOMY    . COLONOSCOPY WITH PROPOFOL N/A 01/29/2017   Procedure: COLONOSCOPY WITH PROPOFOL;  Surgeon: Manya Silvas, MD;  Location: [facility] ENDOSCOPY;  Service: Endoscopy;  Laterality: N/A;  . ESOPHAGOGASTRODUODENOSCOPY (EGD) WITH PROPOFOL N/A 01/29/2017   Procedure: ESOPHAGOGASTRODUODENOSCOPY (EGD) WITH PROPOFOL;  Surgeon: Manya Silvas, MD;  Location: [facility] ENDOSCOPY;  Service: Endoscopy;  Laterality: N/A;  . EYE SURGERY Bilateral    Cataract Extraction with IOL  . KNEE ARTHROSCOPY Left    partial lateral meniscectomy, debridement, excision of Baker's cyst  . LEFT HEART CATH AND CORONARY ANGIOGRAPHY N/A 04/23/2017   Procedure: LEFT HEART CATH AND CORONARY ANGIOGRAPHY;  Surgeon: Teodoro Spray, MD;  Location: [facility] CV LAB;  Service: Cardiovascular;  Laterality: N/A;  . ROTATOR CUFF REPAIR Right   . TOTAL KNEE ARTHROPLASTY Left 04/04/2016   Procedure: TOTAL KNEE ARTHROPLASTY;  Surgeon: Hessie Knows, MD;  Location: [facility] ORS;  Service: Orthopedics;  Laterality: Left;    Home Medications:  Allergies as of 06/19/2017      Reactions   Lodine [etodolac] Hives, Itching   Methotrexate Derivatives Other (See Comments)   Flu like symptoms   Norvasc [amlodipine]    Pentasa [mesalamine Er]    Red Yeast Rice Extract    Vioxx [rofecoxib]    Levaquin [levofloxacin] Itching, Rash, Other (See Comments)   Muscle pain   Sulfa Antibiotics Itching, Rash      Medication List        Accurate as of 06/19/17  1:20 PM. Always use your most  recent med list.          apixaban 5 MG Tabs tablet Commonly known as:  ELIQUIS Take 1 tablet (5 mg total) by mouth 2 (two) times daily.   aspirin EC 81 MG tablet Take 1 tablet (81 mg total) by mouth daily.   budesonide 3 MG 24 hr capsule Commonly known as:  ENTOCORT EC Take 9 mg by mouth daily.   CALCIUM 600+D 600-400 MG-UNIT tablet Generic drug:  Calcium Carbonate-Vitamin D Take 1 tablet by mouth 2 (two) times daily.   diltiazem 240 MG 24 hr capsule Commonly known as:  CARDIZEM CD Take 240 mg by mouth daily.   estradiol 0.1 MG/GM vaginal cream Commonly known as:  ESTRACE Place 1 g vaginally 3 (three) times a week. Start taking on:  06/20/2017   ferrous sulfate 325 (65 FE) MG tablet Take 325 mg by mouth daily with breakfast.   gemfibrozil 600 MG tablet Commonly known as:  LOPID Take 600 mg by mouth 2 (two) times daily before a meal.   hydroxychloroquine 200 MG tablet Commonly known as:  PLAQUENIL Take 200 mg by mouth 2 (two) times daily.   leflunomide 20 MG tablet Commonly known as:  ARAVA Take 20 mg by mouth daily.   LORazepam 1 MG tablet Commonly known as:  ATIVAN Take 1 tablet (1 mg total) by mouth 2 (two) times daily as needed for anxiety.   losartan 100 MG tablet Commonly known as:  COZAAR Take 100 mg by mouth daily.   multivitamin tablet Take 1 tablet by mouth daily.   omeprazole 20 MG capsule Commonly known as:  PRILOSEC Take 20 mg by mouth 2 (two) times daily before a meal.   pravastatin 80 MG tablet Commonly known as:  PRAVACHOL Take 80 mg by mouth at bedtime.   predniSONE 5 MG tablet Commonly known as:  DELTASONE Take 5 mg by mouth daily.   PRESERVISION AREDS Caps Take 1 capsule by mouth 2 (two) times daily.   sertraline 50 MG tablet Commonly known as:  ZOLOFT Take 50 mg by mouth at bedtime.       Allergies:  Allergies  Allergen Reactions  . Lodine [Etodolac] Hives and Itching  . Methotrexate Derivatives Other (See Comments)      Flu like symptoms  . Norvasc [Amlodipine]   . Pentasa [Mesalamine Er]   . Red Yeast Rice Extract   . Vioxx [Rofecoxib]   . Levaquin [Levofloxacin] Itching, Rash and Other (See Comments)    Muscle pain  . Sulfa Antibiotics Itching and Rash    Family History: Family History  Problem Relation Age of Onset  . CAD Mother   . CAD Father   . Breast cancer Neg Hx     Social History:  reports that  has never smoked. she has never used smokeless tobacco. She reports that she does not drink alcohol or use drugs.  ROS: UROLOGY Frequent Urination?: No Hard  to postpone urination?: No Burning/pain with urination?: No Get up at night to urinate?: No Leakage of urine?: Yes Urine stream starts and stops?: No Trouble starting stream?: No Do you have to strain to urinate?: No Blood in urine?: No Urinary tract infection?: Yes Sexually transmitted disease?: No Injury to kidneys or bladder?: No Painful intercourse?: No Weak stream?: No Currently pregnant?: No Vaginal bleeding?: No Last menstrual period?: n  Gastrointestinal Nausea?: No Vomiting?: No Indigestion/heartburn?: No Diarrhea?: No Constipation?: No  Constitutional Fever: No Night sweats?: No Weight loss?: No Fatigue?: Yes  Skin Skin rash/lesions?: No Itching?: No  Eyes Blurred vision?: No Double vision?: No  Ears/Nose/Throat Sore throat?: No Sinus problems?: No  Hematologic/Lymphatic Swollen glands?: No Easy bruising?: Yes  Cardiovascular Leg swelling?: No Chest pain?: Yes  Respiratory Cough?: No Shortness of breath?: Yes  Endocrine Excessive thirst?: No  Musculoskeletal Back pain?: No Joint pain?: Yes  Neurological Headaches?: No Dizziness?: No  Psychologic Depression?: No Anxiety?: Yes  Physical Exam: BP 137/75 (BP Location: Left Arm, Patient Position: Sitting, Cuff Size: Normal)   Pulse 87   Ht 5' 4"  (1.626 m)   Wt 125 lb (56.7 kg)   BMI 21.46 kg/m   Constitutional:  Alert  and oriented, No acute distress. HEENT: [place] AT, moist mucus membranes.  Trachea midline, no masses. Cardiovascular: No clubbing, cyanosis, or edema. Respiratory: Normal respiratory effort, no increased work of breathing. GI: Abdomen is soft, nontender, nondistended, no abdominal masses GU: No CVA tenderness.  Skin: No rashes, bruises or suspicious lesions. Neurologic: Grossly intact, no focal deficits, moving all 4 extremities. Psychiatric: Normal mood and affect.  Laboratory Data: Lab Results  Component Value Date   WBC 16.9 (H) 04/23/2017   HGB 8.4 (L) 04/23/2017   HCT 25.6 (L) 04/23/2017   MCV 94.8 04/23/2017   PLT 253 04/23/2017    Lab Results  Component Value Date   CREATININE 0.87 04/24/2017    Lab Results  Component Value Date   HGBA1C 6.3 (H) 04/23/2017    Urinalysis Lab Results  Component Value Date   SPECGRAV >1.030 (H) 06/05/2017   PHUR 6.0 06/05/2017   COLORU Yellow 06/05/2017   APPEARANCEUR Clear 06/05/2017   LEUKOCYTESUR Negative 06/05/2017   PROTEINUR 1+ (A) 06/05/2017   GLUCOSEU Negative 06/05/2017   KETONESU Negative 06/05/2017   RBCU Negative 06/05/2017   BILIRUBINUR Negative 06/05/2017   UUROB 0.2 06/05/2017   NITRITE Negative 06/05/2017    Lab Results  Component Value Date   LABMICR See below: 06/05/2017   WBCUA None seen 06/05/2017   RBCUA None seen 06/05/2017   LABEPIT 0-10 06/05/2017   MUCUS Present (A) 06/05/2017   BACTERIA Few (A) 06/05/2017    Pertinent Imaging: N/a  Assessment & Plan:    1. Recurrent UTI Review of urine culture data indicates ESBL E. Coli dating back to 02/2017 Several subsequent cultures have grown ESBL E. coli, unclear whether these are recurrent urinary tract infections or perhaps persistent incompletely treated infection Asymptomatic today/UA negative without concerns for ongoing infection Adequate bladder emptying today Previous upper tract imaging unremarkable, no stones or other clear nidus for  infection I recommended regimen of cranberry tabs twice daily, probiotics, the addition of topical estrogen cream 3 times weekly per urethra Pelvic exam next visit Patient was advised to present to our office with any signs or symptoms of UTI for same-day nurse visit - Bladder Scan (Post Void Residual) in office  2. Urge incontinence Baseline urge incontinence Likely exacerbated by recent infection  Trial of Myrbetriq 25 mg x 4 weeks given today, we will reassess at next visit   Return in about 4 weeks (around 07/17/2017) for pelvic exam, PVR, recheck urinary symptoms.  Hollice Espy, MD  [facility] [address], [place], [place] [address] [phone]  I spent 30 min with this patient of which greater than 50% was spent in counseling and coordination of care with the patient.

## 2017-06-19 NOTE — Patient Instructions (Signed)
Increase fluids  Cranberry tablets twice daily  Probiotic  Topical estrogen cream M-W-Fr

## 2017-06-20 DIAGNOSIS — M359 Systemic involvement of connective tissue, unspecified: Secondary | ICD-10-CM | POA: Diagnosis not present

## 2017-06-20 DIAGNOSIS — I7300 Raynaud's syndrome without gangrene: Secondary | ICD-10-CM | POA: Diagnosis not present

## 2017-06-20 DIAGNOSIS — M810 Age-related osteoporosis without current pathological fracture: Secondary | ICD-10-CM | POA: Diagnosis not present

## 2017-06-20 DIAGNOSIS — M542 Cervicalgia: Secondary | ICD-10-CM | POA: Diagnosis not present

## 2017-06-27 DIAGNOSIS — J410 Simple chronic bronchitis: Secondary | ICD-10-CM | POA: Diagnosis not present

## 2017-06-27 DIAGNOSIS — D649 Anemia, unspecified: Secondary | ICD-10-CM | POA: Diagnosis not present

## 2017-06-27 DIAGNOSIS — J8410 Pulmonary fibrosis, unspecified: Secondary | ICD-10-CM | POA: Diagnosis not present

## 2017-06-27 DIAGNOSIS — E7800 Pure hypercholesterolemia, unspecified: Secondary | ICD-10-CM | POA: Diagnosis not present

## 2017-06-27 DIAGNOSIS — I10 Essential (primary) hypertension: Secondary | ICD-10-CM | POA: Diagnosis not present

## 2017-06-27 DIAGNOSIS — F411 Generalized anxiety disorder: Secondary | ICD-10-CM | POA: Diagnosis not present

## 2017-06-27 DIAGNOSIS — Z0000 Encounter for general adult medical examination without abnormal findings: Secondary | ICD-10-CM | POA: Diagnosis not present

## 2017-06-27 DIAGNOSIS — I480 Paroxysmal atrial fibrillation: Secondary | ICD-10-CM | POA: Diagnosis not present

## 2017-07-02 ENCOUNTER — Telehealth: Payer: Self-pay | Admitting: Pharmacist

## 2017-07-02 NOTE — Patient Outreach (Signed)
[facility]) Care Management  07/02/2017  Melissa [name] [birth year] [id number]   Incoming call and voicemail from [name] today.  Successful outreach return call to patient.  HIPAA identifiers verified. [name] reports she received a 90 day supply of Eliquis delivered to her home on December 14th.  We reviewed that she has been approved through 07/11/2018.  If she needs refills, she should contact [facility] directly.  [name] reports that her GI doctor is tapering her off Entocort and she may not need to be on it for much longer.  We reviewed RxOutreach program for future reference in case she needs to resume Entocort.  Patient has no further medication questions or concerns at this time.   Plan: [name] will close patient case at this time as no further medication assistance needs.  I will send an MD closure letter and alert [facility] CMA.  I am happy to assist in the future as needed.    Ralene Bathe, PharmD, [place] [phone]

## 2017-07-03 ENCOUNTER — Other Ambulatory Visit: Payer: Self-pay | Admitting: Pharmacy Technician

## 2017-07-03 NOTE — Patient Outreach (Signed)
[facility]) Care Management  07/03/2017  SASHAY FELLING 1940-11-28 [id number]   Successful outreach call to patient in reference to Eliquis patient assistance.   Colleen (Pharmacist) informed me that in my note written 06/15/17 that I had stated that patient was approved for the program until 07/11/2018. The actual approval date is until 07/16/17.  Informed patient of the error. She stated that she understood that we would have to reapply for 2019 and that she had already received the medication.  Maud Deed [place], [place] Management [phone]

## 2017-07-17 DIAGNOSIS — L97519 Non-pressure chronic ulcer of other part of right foot with unspecified severity: Secondary | ICD-10-CM

## 2017-07-17 HISTORY — DX: Non-pressure chronic ulcer of other part of right foot with unspecified severity: L97.519

## 2017-07-24 ENCOUNTER — Ambulatory Visit: Payer: Medicare HMO | Admitting: Urology

## 2017-07-24 ENCOUNTER — Encounter: Payer: Self-pay | Admitting: Urology

## 2017-07-24 VITALS — BP 119/68 | HR 85 | Ht 64.0 in | Wt 130.0 lb

## 2017-07-24 DIAGNOSIS — N390 Urinary tract infection, site not specified: Secondary | ICD-10-CM | POA: Diagnosis not present

## 2017-07-24 DIAGNOSIS — N3941 Urge incontinence: Secondary | ICD-10-CM | POA: Diagnosis not present

## 2017-07-24 DIAGNOSIS — N952 Postmenopausal atrophic vaginitis: Secondary | ICD-10-CM

## 2017-07-24 LAB — BLADDER SCAN AMB NON-IMAGING

## 2017-07-24 NOTE — Progress Notes (Signed)
07/24/2017 2:35 PM   Melissa [name] 02-25-1941 [id number]  Referring provider: [name], MD [facility] [place], [place] [address]  Chief Complaint  Patient presents with  . Recurrent UTI    4wk    HPI: 77 year old female recently seen and evaluated for recurrent ESBL E. coli and urinary frequency.  Since last visit, she is done exceptionally well.  She started using topical estrogen cream with good results.  Additionally, she took Myrbetriq 25 mg for 4 weeks for urinary frequency and urge incontinence.  Since taking the medication, this is almost completely resolved.  She ran out of samples a week ago and her symptoms remain well controlled despite not taking the medication.  She denies any dysuria, flank pain, fevers, or chills.  No interval infections since last visit.  Overall, she is very pleased today.   PMH: Past Medical History:  Diagnosis Date  . Anemia   . Anxiety   . Arthritis    rheumatoid  . Ataxia   . COPD (chronic obstructive pulmonary disease) ([place])   . Depression   . Diverticulosis   . Excessive falling   . GERD (gastroesophageal reflux disease)   . Hyperlipidemia   . Hypertension   . IBS (irritable bowel syndrome)   . Osteoarthritis   . Psoriasis   . Raynaud's disease without gangrene   . Raynaud's disease without gangrene   . Shortness of breath dyspnea    with exertion    Surgical History: Past Surgical History:  Procedure Laterality Date  . ABDOMINAL HYSTERECTOMY    . APPENDECTOMY    . BACK SURGERY     Spinal fusion with rods and screws  . CHOLECYSTECTOMY    . COLONOSCOPY WITH PROPOFOL N/A 01/29/2017   Procedure: COLONOSCOPY WITH PROPOFOL;  Surgeon: Manya Silvas, MD;  Location: [facility] ENDOSCOPY;  Service: Endoscopy;  Laterality: N/A;  . ESOPHAGOGASTRODUODENOSCOPY (EGD) WITH PROPOFOL N/A 01/29/2017   Procedure: ESOPHAGOGASTRODUODENOSCOPY (EGD) WITH PROPOFOL;  Surgeon: Manya Silvas, MD;   Location: [facility] ENDOSCOPY;  Service: Endoscopy;  Laterality: N/A;  . EYE SURGERY Bilateral    Cataract Extraction with IOL  . KNEE ARTHROSCOPY Left    partial lateral meniscectomy, debridement, excision of Baker's cyst  . LEFT HEART CATH AND CORONARY ANGIOGRAPHY N/A 04/23/2017   Procedure: LEFT HEART CATH AND CORONARY ANGIOGRAPHY;  Surgeon: Teodoro Spray, MD;  Location: [facility] CV LAB;  Service: Cardiovascular;  Laterality: N/A;  . ROTATOR CUFF REPAIR Right   . TOTAL KNEE ARTHROPLASTY Left 04/04/2016   Procedure: TOTAL KNEE ARTHROPLASTY;  Surgeon: Hessie Knows, MD;  Location: [facility] ORS;  Service: Orthopedics;  Laterality: Left;    Home Medications:  Allergies as of 07/24/2017      Reactions   Lodine [etodolac] Hives, Itching   Methotrexate Derivatives Other (See Comments)   Flu like symptoms   Norvasc [amlodipine]    Pentasa [mesalamine Er]    Red Yeast Rice Extract    Vioxx [rofecoxib]    Levaquin [levofloxacin] Itching, Rash, Other (See Comments)   Muscle pain   Sulfa Antibiotics Itching, Rash      Medication List        Accurate as of 07/24/17 11:59 PM. Always use your most recent med list.          apixaban 5 MG Tabs tablet Commonly known as:  ELIQUIS Take 1 tablet (5 mg total) by mouth 2 (two) times daily.   aspirin EC 81 MG tablet Take  1 tablet (81 mg total) by mouth daily.   budesonide 3 MG 24 hr capsule Commonly known as:  ENTOCORT EC Take 9 mg by mouth daily.   CALCIUM 600+D 600-400 MG-UNIT tablet Generic drug:  Calcium Carbonate-Vitamin D Take 1 tablet by mouth 2 (two) times daily.   Cranberry 1000 MG Caps Take by mouth.   diltiazem 240 MG 24 hr capsule Commonly known as:  CARDIZEM CD Take 240 mg by mouth daily.   estradiol 0.1 MG/GM vaginal cream Commonly known as:  ESTRACE Place 1 g vaginally 3 (three) times a week.   ferrous sulfate 325 (65 FE) MG tablet Take 325 mg by mouth daily with breakfast.   gemfibrozil 600 MG tablet Commonly known  as:  LOPID Take 600 mg by mouth 2 (two) times daily before a meal.   hydroxychloroquine 200 MG tablet Commonly known as:  PLAQUENIL Take 200 mg by mouth 2 (two) times daily.   leflunomide 20 MG tablet Commonly known as:  ARAVA Take 20 mg by mouth daily.   LORazepam 1 MG tablet Commonly known as:  ATIVAN Take 1 tablet (1 mg total) by mouth 2 (two) times daily as needed for anxiety.   losartan 100 MG tablet Commonly known as:  COZAAR Take 100 mg by mouth daily.   multivitamin tablet Take 1 tablet by mouth daily.   omeprazole 20 MG capsule Commonly known as:  PRILOSEC Take 20 mg by mouth 2 (two) times daily before a meal.   pravastatin 80 MG tablet Commonly known as:  PRAVACHOL Take 80 mg by mouth at bedtime.   PRESERVISION AREDS Caps Take 1 capsule by mouth 2 (two) times daily.   sertraline 50 MG tablet Commonly known as:  ZOLOFT Take 50 mg by mouth at bedtime.   tiZANidine 4 MG tablet Commonly known as:  ZANAFLEX Take by mouth.       Allergies:  Allergies  Allergen Reactions  . Lodine [Etodolac] Hives and Itching  . Methotrexate Derivatives Other (See Comments)    Flu like symptoms  . Norvasc [Amlodipine]   . Pentasa [Mesalamine Er]   . Red Yeast Rice Extract   . Vioxx [Rofecoxib]   . Levaquin [Levofloxacin] Itching, Rash and Other (See Comments)    Muscle pain  . Sulfa Antibiotics Itching and Rash    Family History: Family History  Problem Relation Age of Onset  . CAD Mother   . CAD Father   . Breast cancer Neg Hx     Social History:  reports that  has never smoked. she has never used smokeless tobacco. She reports that she does not drink alcohol or use drugs.  ROS: UROLOGY Frequent Urination?: No Hard to postpone urination?: No Burning/pain with urination?: No Get up at night to urinate?: No Leakage of urine?: Yes Urine stream starts and stops?: No Trouble starting stream?: No Do you have to strain to urinate?: No Blood in urine?:  No Urinary tract infection?: No Sexually transmitted disease?: No Injury to kidneys or bladder?: No Painful intercourse?: No Weak stream?: No Currently pregnant?: No Vaginal bleeding?: No Last menstrual period?: n  Gastrointestinal Nausea?: No Vomiting?: No Indigestion/heartburn?: No Diarrhea?: Yes Constipation?: No  Constitutional Fever: No Night sweats?: No Weight loss?: No Fatigue?: No  Skin Skin rash/lesions?: No Itching?: No  Eyes Blurred vision?: No Double vision?: No  Ears/Nose/Throat Sore throat?: No Sinus problems?: No  Hematologic/Lymphatic Swollen glands?: No Easy bruising?: No  Cardiovascular Leg swelling?: No Chest pain?: No  Respiratory Cough?:  No Shortness of breath?: No  Endocrine Excessive thirst?: No  Musculoskeletal Back pain?: No Joint pain?: No  Neurological Headaches?: No Dizziness?: No  Psychologic Depression?: No Anxiety?: No  Physical Exam: BP 119/68   Pulse 85   Ht 5' 4"  (1.626 m)   Wt 130 lb (59 kg)   BMI 22.31 kg/m   Constitutional:  Alert and oriented, No acute distress. HEENT: [place] AT, moist mucus membranes.  Trachea midline, no masses. Cardiovascular: No clubbing, cyanosis, or edema. Respiratory: Normal respiratory effort, no increased work of breathing. GI: Abdomen is soft, nontender, nondistended, no abdominal masses Pelvic: Normal external genitalia.  Mild atrophic vaginitis appreciated.  Excellent vaginal support anterior and posteriorly.  No apical descent with Valsalva.  Normal urethral meatus.  No demonstrable stress urinary incontinence with Valsalva. Skin: No rashes, bruises or suspicious lesions. Neurologic: Grossly intact, no focal deficits, moving all 4 extremities. Psychiatric: Normal mood and affect.  Examination today which chaperoned by Fonnie Jarvis, MA  Laboratory Data: Lab Results  Component Value Date   WBC 16.9 (H) 04/23/2017   HGB 8.4 (L) 04/23/2017   HCT 25.6 (L) 04/23/2017   MCV  94.8 04/23/2017   PLT 253 04/23/2017    Lab Results  Component Value Date   CREATININE 0.87 04/24/2017    Lab Results  Component Value Date   HGBA1C 6.3 (H) 04/23/2017    Urinalysis Lab Results  Component Value Date   SPECGRAV >1.030 (H) 06/05/2017   PHUR 6.0 06/05/2017   COLORU Yellow 06/05/2017   APPEARANCEUR Clear 06/05/2017   LEUKOCYTESUR Negative 06/05/2017   PROTEINUR 1+ (A) 06/05/2017   GLUCOSEU Negative 06/05/2017   KETONESU Negative 06/05/2017   RBCU Negative 06/05/2017   BILIRUBINUR Negative 06/05/2017   UUROB 0.2 06/05/2017   NITRITE Negative 06/05/2017    Lab Results  Component Value Date   LABMICR See below: 06/05/2017   WBCUA None seen 06/05/2017   RBCUA None seen 06/05/2017   LABEPIT 0-10 06/05/2017   MUCUS Present (A) 06/05/2017   BACTERIA Few (A) 06/05/2017    Pertinent Imaging: Results for orders placed or performed in visit on 07/24/17  BLADDER SCAN AMB NON-IMAGING  Result Value Ref Range   Scan Result 48m     Assessment & Plan:    1. Recurrent UTI Adequate emptying Continue topical estrogen cream Other interventions as previously discussed Return as needed for any urinary tract symptoms - BLADDER SCAN AMB NON-IMAGING  2. Atrophic vaginitis Improved with topical estrogen cream Continue this medication  3. Urge incontinence Improvement with Myrbetriq 25 mg Since running out of this medication, her urinary tract symptoms remain improved Hold off on meds for now --> call if symptoms improve  Return if symptoms worsen or fail to improve.  AHollice Espy MD  [facility] [address] [place] [phone]

## 2017-07-31 DIAGNOSIS — H35372 Puckering of macula, left eye: Secondary | ICD-10-CM | POA: Diagnosis not present

## 2017-07-31 DIAGNOSIS — Z79899 Other long term (current) drug therapy: Secondary | ICD-10-CM | POA: Diagnosis not present

## 2017-07-31 DIAGNOSIS — H43823 Vitreomacular adhesion, bilateral: Secondary | ICD-10-CM | POA: Diagnosis not present

## 2017-07-31 DIAGNOSIS — H40013 Open angle with borderline findings, low risk, bilateral: Secondary | ICD-10-CM | POA: Diagnosis not present

## 2017-08-01 DIAGNOSIS — F411 Generalized anxiety disorder: Secondary | ICD-10-CM | POA: Diagnosis not present

## 2017-08-01 DIAGNOSIS — I480 Paroxysmal atrial fibrillation: Secondary | ICD-10-CM | POA: Diagnosis not present

## 2017-08-01 DIAGNOSIS — R0602 Shortness of breath: Secondary | ICD-10-CM | POA: Diagnosis not present

## 2017-08-01 DIAGNOSIS — E7800 Pure hypercholesterolemia, unspecified: Secondary | ICD-10-CM | POA: Diagnosis not present

## 2017-08-01 DIAGNOSIS — I10 Essential (primary) hypertension: Secondary | ICD-10-CM | POA: Diagnosis not present

## 2017-08-08 DIAGNOSIS — K529 Noninfective gastroenteritis and colitis, unspecified: Secondary | ICD-10-CM | POA: Diagnosis not present

## 2017-08-16 DIAGNOSIS — R0602 Shortness of breath: Secondary | ICD-10-CM | POA: Diagnosis not present

## 2017-08-17 DIAGNOSIS — M25522 Pain in left elbow: Secondary | ICD-10-CM | POA: Diagnosis not present

## 2017-08-17 DIAGNOSIS — F3341 Major depressive disorder, recurrent, in partial remission: Secondary | ICD-10-CM | POA: Diagnosis not present

## 2017-08-17 DIAGNOSIS — M7022 Olecranon bursitis, left elbow: Secondary | ICD-10-CM | POA: Diagnosis not present

## 2017-08-17 DIAGNOSIS — J449 Chronic obstructive pulmonary disease, unspecified: Secondary | ICD-10-CM | POA: Diagnosis not present

## 2017-08-17 DIAGNOSIS — M359 Systemic involvement of connective tissue, unspecified: Secondary | ICD-10-CM | POA: Diagnosis not present

## 2017-08-17 DIAGNOSIS — I219 Acute myocardial infarction, unspecified: Secondary | ICD-10-CM

## 2017-08-17 HISTORY — DX: Acute myocardial infarction, unspecified: I21.9

## 2017-08-21 DIAGNOSIS — M810 Age-related osteoporosis without current pathological fracture: Secondary | ICD-10-CM | POA: Diagnosis not present

## 2017-08-21 DIAGNOSIS — M7022 Olecranon bursitis, left elbow: Secondary | ICD-10-CM | POA: Diagnosis not present

## 2017-08-21 DIAGNOSIS — M359 Systemic involvement of connective tissue, unspecified: Secondary | ICD-10-CM | POA: Diagnosis not present

## 2017-08-21 DIAGNOSIS — I7300 Raynaud's syndrome without gangrene: Secondary | ICD-10-CM | POA: Diagnosis not present

## 2017-08-30 DIAGNOSIS — E7800 Pure hypercholesterolemia, unspecified: Secondary | ICD-10-CM | POA: Diagnosis not present

## 2017-08-30 DIAGNOSIS — I480 Paroxysmal atrial fibrillation: Secondary | ICD-10-CM | POA: Diagnosis not present

## 2017-08-30 DIAGNOSIS — R42 Dizziness and giddiness: Secondary | ICD-10-CM | POA: Diagnosis not present

## 2017-08-30 DIAGNOSIS — I10 Essential (primary) hypertension: Secondary | ICD-10-CM | POA: Diagnosis not present

## 2017-09-07 ENCOUNTER — Encounter: Payer: Self-pay | Admitting: *Deleted

## 2017-09-07 ENCOUNTER — Ambulatory Visit
Admission: RE | Admit: 2017-09-07 | Discharge: 2017-09-07 | Disposition: A | Discharge status: Home / Self Care | Payer: Medicare HMO | Source: Ambulatory Visit | Attending: Cardiology | Admitting: Cardiology

## 2017-09-07 ENCOUNTER — Encounter
Admission: RE | Disposition: A | Discharge status: Home / Self Care | Payer: Self-pay | Source: Ambulatory Visit | Attending: Cardiology

## 2017-09-07 DIAGNOSIS — K589 Irritable bowel syndrome without diarrhea: Secondary | ICD-10-CM | POA: Diagnosis not present

## 2017-09-07 DIAGNOSIS — K219 Gastro-esophageal reflux disease without esophagitis: Secondary | ICD-10-CM | POA: Insufficient documentation

## 2017-09-07 DIAGNOSIS — J449 Chronic obstructive pulmonary disease, unspecified: Secondary | ICD-10-CM | POA: Insufficient documentation

## 2017-09-07 DIAGNOSIS — I4891 Unspecified atrial fibrillation: Secondary | ICD-10-CM | POA: Insufficient documentation

## 2017-09-07 DIAGNOSIS — M1990 Unspecified osteoarthritis, unspecified site: Secondary | ICD-10-CM | POA: Diagnosis not present

## 2017-09-07 DIAGNOSIS — R079 Chest pain, unspecified: Secondary | ICD-10-CM

## 2017-09-07 DIAGNOSIS — F329 Major depressive disorder, single episode, unspecified: Secondary | ICD-10-CM | POA: Insufficient documentation

## 2017-09-07 DIAGNOSIS — I2510 Atherosclerotic heart disease of native coronary artery without angina pectoris: Secondary | ICD-10-CM | POA: Diagnosis not present

## 2017-09-07 DIAGNOSIS — Z7901 Long term (current) use of anticoagulants: Secondary | ICD-10-CM | POA: Diagnosis not present

## 2017-09-07 DIAGNOSIS — E7800 Pure hypercholesterolemia, unspecified: Secondary | ICD-10-CM | POA: Diagnosis not present

## 2017-09-07 DIAGNOSIS — Z7982 Long term (current) use of aspirin: Secondary | ICD-10-CM | POA: Diagnosis not present

## 2017-09-07 DIAGNOSIS — J8410 Pulmonary fibrosis, unspecified: Secondary | ICD-10-CM | POA: Diagnosis not present

## 2017-09-07 DIAGNOSIS — F419 Anxiety disorder, unspecified: Secondary | ICD-10-CM | POA: Diagnosis not present

## 2017-09-07 DIAGNOSIS — Z8249 Family history of ischemic heart disease and other diseases of the circulatory system: Secondary | ICD-10-CM | POA: Insufficient documentation

## 2017-09-07 DIAGNOSIS — I5021 Acute systolic (congestive) heart failure: Secondary | ICD-10-CM | POA: Diagnosis not present

## 2017-09-07 DIAGNOSIS — Z882 Allergy status to sulfonamides status: Secondary | ICD-10-CM | POA: Diagnosis not present

## 2017-09-07 DIAGNOSIS — I2582 Chronic total occlusion of coronary artery: Secondary | ICD-10-CM | POA: Diagnosis not present

## 2017-09-07 DIAGNOSIS — I10 Essential (primary) hypertension: Secondary | ICD-10-CM | POA: Insufficient documentation

## 2017-09-07 HISTORY — PX: LEFT HEART CATH AND CORONARY ANGIOGRAPHY: CATH118249

## 2017-09-07 SURGERY — LEFT HEART CATH AND CORONARY ANGIOGRAPHY
Anesthesia: Moderate Sedation | Laterality: Left

## 2017-09-07 MED ORDER — SODIUM CHLORIDE 0.9 % WEIGHT BASED INFUSION: 3.0000 mL/kg/h | INTRAVENOUS | Status: AC

## 2017-09-07 MED ORDER — SODIUM CHLORIDE 0.9 % WEIGHT BASED INFUSION: 1.0000 mL/kg/h | INTRAVENOUS | Status: DC

## 2017-09-07 MED ORDER — HEPARIN (PORCINE) IN NACL 2-0.9 UNIT/ML-% IJ SOLN
INTRAMUSCULAR | Status: AC
  Filled 2017-09-07: qty 500

## 2017-09-07 MED ORDER — ASPIRIN 81 MG PO CHEW
81.0000 mg | CHEWABLE_TABLET | ORAL | Status: AC
  Administered 2017-09-07: 81 mg via ORAL

## 2017-09-07 MED ORDER — SODIUM CHLORIDE 0.9% FLUSH: 3.0000 mL | Freq: Two times a day (BID) | INTRAVENOUS | Status: DC

## 2017-09-07 MED ORDER — SODIUM CHLORIDE 0.9 % IV SOLN: 250.0000 mL | INTRAVENOUS | Status: DC | PRN

## 2017-09-07 MED ORDER — FENTANYL CITRATE (PF) 100 MCG/2ML IJ SOLN
INTRAMUSCULAR | Status: DC | PRN
  Administered 2017-09-07: 25 ug via INTRAVENOUS

## 2017-09-07 MED ORDER — MIDAZOLAM HCL 2 MG/2ML IJ SOLN
INTRAMUSCULAR | Status: DC | PRN
  Administered 2017-09-07: 1 mg via INTRAVENOUS

## 2017-09-07 MED ORDER — SODIUM CHLORIDE 0.9% FLUSH: 3.0000 mL | INTRAVENOUS | Status: DC | PRN

## 2017-09-07 MED ORDER — SODIUM CHLORIDE 0.9 % IV SOLN
INTRAVENOUS | Status: DC
  Administered 2017-09-07: 09:00:00 via INTRAVENOUS

## 2017-09-07 MED ORDER — FENTANYL CITRATE (PF) 100 MCG/2ML IJ SOLN
INTRAMUSCULAR | Status: AC
  Filled 2017-09-07: qty 2

## 2017-09-07 MED ORDER — ASPIRIN 81 MG PO CHEW
CHEWABLE_TABLET | ORAL | Status: AC
  Filled 2017-09-07: qty 1

## 2017-09-07 MED ORDER — MIDAZOLAM HCL 2 MG/2ML IJ SOLN
INTRAMUSCULAR | Status: AC
  Filled 2017-09-07: qty 2

## 2017-09-07 SURGICAL SUPPLY — 9 items
CATH INFINITI 5FR ANG PIGTAIL (CATHETERS) ×3 IMPLANT
CATH INFINITI 5FR JL4 (CATHETERS) ×3 IMPLANT
CATH INFINITI JR4 5F (CATHETERS) ×3 IMPLANT
DEVICE CLOSURE MYNXGRIP 5F (Vascular Products) ×3 IMPLANT
KIT MANI 3VAL PERCEP (MISCELLANEOUS) ×3 IMPLANT
NEEDLE PERC 18GX7CM (NEEDLE) ×3 IMPLANT
PACK CARDIAC CATH (CUSTOM PROCEDURE TRAY) ×3 IMPLANT
SHEATH AVANTI 5FR X 11CM (SHEATH) ×3 IMPLANT
WIRE GUIDERIGHT .035X150 (WIRE) ×3 IMPLANT

## 2017-09-07 NOTE — Progress Notes (Signed)
Air released from PAD-BA and Tegaderm to site. No complications noted; pt. Escorted to BR & back to room. No acute distress. DC instructions reviewed with pt. And family with verbalized understanding. Stable for DC home.

## 2017-09-07 NOTE — H&P (Signed)
Chief Complaint: Chief Complaint  Patient presents with  . Follow-up  echo  Date of Service: 08/30/2017 Date of Birth: 1941-02-03 PCP: [name], MD  History of Present Illness: [name] is a 77 y.o.female patient who returns for follow-up visit. She has a history of atrial fibrillation with rapid ventricular response. This was first noted during an admission for severe diarrhea and volume depletion. Elevated troponin up to 15. She underwent left heart cath in October 2018 after her diarrhea is stabilized. This revealed occluded RCA but no significant left system disease. She was felt to have demand ischemia in the face of A. fib with rapid ventricular response, dehydration and diarrhea. He had a rate control with Cardizem and converted back to sinus rhythm. She is on Eliquis. She she has bruising on her face but denies falling. She states she hit herself in the face with an object by mistake. She has noted worsening shortness of breath and fatigue. Repeat echocardiogram showed a reduction in her LV function with EF currently 35-40% down from 40-50%. She has mild aortic stenosis ,moderate MR and TR. She is more short of breath and fatigued.  Past Medical and Surgical History  Past Medical History Past Medical History:  Diagnosis Date  . Abdominal pain, recurrent, unspecified 01/18/2017  . Acid reflux disease  . Anemia, unspecified  Chronic anemia/stomach telangiectasias  . Anxiety disorder, unspecified  . Bright red rectal bleeding 01/08/2017  . Cataract cortical, senile  . Chickenpox  . 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 s/p Methotrexate, intolerant b. Low dose Prednisone c. Leflunomide e. s/p Plaquenil f. Rheumatoid factor negative, FANA 1:160. Negative SCL-70 antibody. g. Raynaud's h. Sclerodactyly? i. Mild pulmonary fibrosis j. Stomach telangiectasias. No significant hand telangiectasias.  Marland Kitchen COPD (chronic obstructive pulmonary disease) , unspecified  (CMS-HCC)  . Depression, unspecified  . Diarrhea, unspecified 04/30/2017  . Diverticulosis  . Hemorrhoids  . Herpes  . Hyperlipidemia, unspecified  . Hypertension  ([place]) a. Diastolic dysfunction  . IBS (irritable bowel syndrome)  . LLQ pain 01/08/2017  . Osteoarthritis  ([place]) a. Hands b. Lumbar spine c. Carpal tunnel  . Palpitations  . Psoriasis, unspecified  ([place]) a. Question psoriatic arthritis  . Pulmonary fibrosis (CMS-HCC)  on CT scan  . Sinusitis, unspecified   Past Surgical History She has a past surgical history that includes Hysterectomy; Cholecystectomy; Appendectomy; Tonsillectomy; Rotator cuff repair (06/2009); Carpal tunnel release (Right, 10/18/2009); egd (05/17/2011, 11/16/2008, 03/07/2010, 11/16/2009, 05/11/2009, 11/24/2008, 06/25/1998); Colonoscopy (04/12/2007, 06/25/1998); egd (11/24/2008); Back surgery (07/2012); Knee arthroscopy (06/25/2013); Arthroplasty Total Knee (Left, 04/04/2016); egd (01/20/2013); Colonoscopy (01/29/2017); and egd (01/29/2017).   Medications and Allergies  Current Medications  Current Outpatient Medications  Medication Sig Dispense Refill  . apixaban (ELIQUIS) 5 mg tablet Take 1 tablet (5 mg total) by mouth every 12 (twelve) hours 180 tablet 3  . aspirin 81 MG EC tablet Take 81 mg by mouth once daily  . budesonide (ENTOCORT EC) 3 mg EC capsule Take as directed on the instruction sheet as you lower the dose 60 capsule 3  . CALCIUM CITRATE-VITAMIN D2 ORAL Take 1 tablet by mouth once daily.   Marland Kitchen CARTIA XT 240 mg CD capsule TAKE 1 CAPSULE EVERY DAY 90 capsule 1  . cranberry fruit extract (CRANBERRY EXTRACT ORAL) Take by mouth once daily  . diphenoxylate-atropine (LOMOTIL) 2.5-0.025 mg tablet Take 1 tablet by mouth 4 (four) times daily as needed for Diarrhea.  Lyndle Herrlich SULFATE (SLOW FE ORAL) Take by mouth  once daily.  Marland Kitchen gemfibrozil (LOPID) 600 mg tablet TAKE 1 TABLET TWICE DAILY 180 tablet 1  . hydroxychloroquine (PLAQUENIL) 200 mg tablet TAKE 2  TABLETS EVERY DAY AS DIRECTED 180 tablet 1  . leflunomide (ARAVA) 20 MG tablet TAKE 1 TABLET EVERY DAY 90 tablet 1  . LORazepam (ATIVAN) 1 MG tablet Take 1 tablet (1 mg total) by mouth 3 (three) times daily as needed for Anxiety. 90 tablet 3  . losartan (COZAAR) 100 MG tablet Take 0.5 tablets (50 mg total) by mouth once daily 90 tablet 1  . mirabegron (MYRBETRIQ ORAL) Take by mouth once daily  . nystatin 500,000 unit tablet Take 1 tablet by mouth  . omeprazole (PRILOSEC) 20 MG DR capsule TAKE 1 CAPSULE TWICE DAILY 180 capsule 3  . potassium chloride (K-DUR,KLOR-CON) 20 MEQ ER tablet Take 20 mEq by mouth 2 (two) times daily  . pravastatin (PRAVACHOL) 80 MG tablet TAKE 1 TABLET EVERY DAY 90 tablet 1  . predniSONE (DELTASONE) 5 MG tablet TAKE 1 TABLET EVERY DAY 90 tablet 1  . tiZANidine (ZANAFLEX) 4 MG tablet Take 1 tablet (4 mg total) by mouth 3 (three) times daily As needed for muscle spasm 30 tablet 1  . VIT C/E/ZN/COPPR/LUTEIN/ZEAXAN (PRESERVISION AREDS 2 ORAL) Take by mouth.   No current facility-administered medications for this visit.   Allergies: Sulfa (sulfonamide antibiotics); Levaquin [levofloxacin]; Lodine [etodolac]; Methotrexate; Norvasc [amlodipine]; Pentasa [mesalamine]; Red yeast rice extract; Vioxx [rofecoxib]; and Sulfasalazine  Social and Family History  Social History reports that she has never smoked. She has never used smokeless tobacco. She reports that she does not drink alcohol or use drugs.  Family History Family History  Problem Relation Age of Onset  . Myocardial Infarction (Heart attack) Mother  . High blood pressure (Hypertension) Mother  . Myocardial Infarction (Heart attack) Father  . Myocardial Infarction (Heart attack) Other  . High blood pressure (Hypertension) Other   Review of Systems  Review of Systems  Constitutional: Negative for chills, diaphoresis, fever, malaise/fatigue and weight loss.  HENT: Negative for congestion, ear discharge, hearing  loss and tinnitus.  Eyes: Negative for blurred vision.  Respiratory: Negative for cough, hemoptysis, sputum production, shortness of breath and wheezing.  Cardiovascular: Negative for chest pain, palpitations, orthopnea, claudication, leg swelling and PND.  Gastrointestinal: Negative for abdominal pain, blood in stool, constipation, heartburn, melena, nausea and vomiting.  Genitourinary: Negative for dysuria, frequency, hematuria and urgency.  Musculoskeletal: Negative for back pain, falls, joint pain and myalgias.  Skin: Negative for itching and rash.  Neurological: Negative for dizziness, tingling, focal weakness, loss of consciousness, weakness and headaches.  Endo/Heme/Allergies: Negative for polydipsia. Bruises/bleeds easily.  Psychiatric/Behavioral: Negative for depression, memory loss and substance abuse. The patient is not nervous/anxious.   Physical Examination   Vitals:BP 102/68  Pulse 84  Resp 12  Ht 162.6 cm (5' 4" )  Wt 59 kg (130 lb)  BMI 22.31 kg/m  Ht:162.6 cm (5' 4" ) Wt:59 kg (130 lb) UUV:OZDG surface area is 1.63 meters squared. Body mass index is 22.31 kg/m.  Wt Readings from Last 3 Encounters:  08/30/17 59 kg (130 lb)  08/21/17 59.4 kg (131 lb)  08/17/17 59 kg (130 lb)   BP Readings from Last 3 Encounters:  08/30/17 102/68  08/21/17 132/68  08/17/17 120/72   General appearance appears in no acute distress  Head Mouth and Eye exam Normocephalic, without obvious abnormality, atraumatic Dentition is good Eyes appear anicteric   Neck exam Thyroid: normal  Nodes: no obvious  adenopathy  LUNGS Breath Sounds: Normal Percussion: Normal  CARDIOVASCULAR JVP CV wave: no HJR: no Elevation at 90 degrees: None Carotid Pulse: normal pulsation bilaterally Bruit: None Apex: apical impulse normal  Auscultation Rhythm: normal sinus rhythm S1: normal S2: normal Clicks: no Rub: no Murmurs: 1/6 medium pitched mid systolic blowing at lower left sternal  border  Gallop: None ABDOMEN Liver enlargement: no Pulsatile aorta: no Ascites: no Bruits: no  EXTREMITIES Clubbing: no Edema: trace to 1+ bilateral pedal edema Pulses: peripheral pulses symmetrical Femoral Bruits: no Amputation: no SKIN Rash: no Cyanosis: no Embolic phemonenon: no Bruising: no NEURO Alert and Oriented to person, place and time: yes Non focal: yes  PSYCH: Pt appears to have normal affect  LABS REVIEWED Last 3 CBC results: Lab Results  Component Value Date  WBC 17.0 (H) 08/30/2017  WBC 8.6 06/18/2017  WBC 10.4 (H) 05/21/2017   Lab Results  Component Value Date  HGB 11.0 (L) 08/30/2017  HGB 11.6 (L) 06/18/2017  HGB 11.0 (L) 05/21/2017   Lab Results  Component Value Date  HCT 35.1 08/30/2017  HCT 36.6 06/18/2017  HCT 35.2 05/21/2017   Lab Results  Component Value Date  PLT 230 08/30/2017  PLT 297 06/18/2017  PLT 242 05/21/2017   Lab Results  Component Value Date  CREATININE 0.7 06/18/2017  BUN 22 06/18/2017  NA 140 06/18/2017  K 4.6 06/18/2017  CL 107 06/18/2017  CO2 26.2 06/18/2017   No results found for: HGBA1C  Lab Results  Component Value Date  HDL 56.2 06/18/2017  HDL 46.8 10/24/2016  HDL 58.9 07/14/2015   Lab Results  Component Value Date  LDLCALC 113 06/18/2017  LDLCALC 132 (H) 10/24/2016  LDLCALC 121 07/14/2015   Lab Results  Component Value Date  TRIG 164 06/18/2017  TRIG 224 (H) 10/24/2016  TRIG 158 07/14/2015   Lab Results  Component Value Date  ALT 12 06/18/2017  AST 16 06/18/2017  ALKPHOS 37 06/18/2017   Lab Results  Component Value Date  TSH 2.306 12/13/2016   Diagnostic Studies Reviewed:  EKG EKG demonstrated normal sinus rhythm, nonspecific ST and T waves changes.  Assessment and Plan   77 y.o. female with  ICD-10-CM ICD-9-CM  1. Atrial fibrillation, unspecified type (CMS-HCC)-A. fib. On Eliquis. Rate controlled with Cartia XT. Will continue with this regimen. Will place a Holter monitor  to determine whether or not she does have intermittent atrial fibrillation. I48.91 427.31 ECG 12-lead  2. Pure hypercholesterolemia-on gemfibrozil and pravastatin. LDL goal less than 100. E78.00 272.0  3. Essential hypertension-blood pressure controlled with Cartia XT as well as losartan 100 mg daily. She is somewhat hypertensive today. Will reduce her losartan to 50 mg daily and continue with Cartia XT.. I10 401.9  4. Elevated troponin I level admitted to coronary artery disease-patient had occluded RCA with insignificant disease in the left system. She now has a reduction in her EF from 40-50 down to 35%. There is regional wall motion abnormality. Will need to consider further invasive evaluation to determine if there is any interval change in her coronary anatomy. Will evaluate for atrial arrhythmia prior to proceeding with this. She will need to stop her Eliquis 3 days prior to the procedure. R74.8 790.6   Return in about 1 month (around 09/27/2017).  These notes generated with voice recognition software. I apologize for typographical errors.  Sydnee Levans, MD    Pt seen and examined. No change from above.

## 2017-09-17 DIAGNOSIS — I10 Essential (primary) hypertension: Secondary | ICD-10-CM | POA: Diagnosis not present

## 2017-09-17 DIAGNOSIS — E7800 Pure hypercholesterolemia, unspecified: Secondary | ICD-10-CM | POA: Diagnosis not present

## 2017-09-17 DIAGNOSIS — I480 Paroxysmal atrial fibrillation: Secondary | ICD-10-CM | POA: Diagnosis not present

## 2017-09-18 DIAGNOSIS — I7300 Raynaud's syndrome without gangrene: Secondary | ICD-10-CM | POA: Diagnosis not present

## 2017-09-18 DIAGNOSIS — M810 Age-related osteoporosis without current pathological fracture: Secondary | ICD-10-CM | POA: Diagnosis not present

## 2017-09-18 DIAGNOSIS — M359 Systemic involvement of connective tissue, unspecified: Secondary | ICD-10-CM | POA: Diagnosis not present

## 2017-09-18 DIAGNOSIS — R7309 Other abnormal glucose: Secondary | ICD-10-CM | POA: Diagnosis not present

## 2017-09-25 DIAGNOSIS — E7800 Pure hypercholesterolemia, unspecified: Secondary | ICD-10-CM | POA: Diagnosis not present

## 2017-09-25 DIAGNOSIS — Z1231 Encounter for screening mammogram for malignant neoplasm of breast: Secondary | ICD-10-CM | POA: Diagnosis not present

## 2017-09-25 DIAGNOSIS — I480 Paroxysmal atrial fibrillation: Secondary | ICD-10-CM | POA: Diagnosis not present

## 2017-09-25 DIAGNOSIS — I10 Essential (primary) hypertension: Secondary | ICD-10-CM | POA: Diagnosis not present

## 2017-09-25 DIAGNOSIS — R7309 Other abnormal glucose: Secondary | ICD-10-CM | POA: Diagnosis not present

## 2017-10-03 ENCOUNTER — Other Ambulatory Visit: Payer: Self-pay | Admitting: Internal Medicine

## 2017-10-03 DIAGNOSIS — Z1231 Encounter for screening mammogram for malignant neoplasm of breast: Secondary | ICD-10-CM

## 2017-11-07 DIAGNOSIS — L97521 Non-pressure chronic ulcer of other part of left foot limited to breakdown of skin: Secondary | ICD-10-CM | POA: Diagnosis not present

## 2017-11-07 DIAGNOSIS — L97512 Non-pressure chronic ulcer of other part of right foot with fat layer exposed: Secondary | ICD-10-CM | POA: Diagnosis not present

## 2017-11-07 DIAGNOSIS — I7300 Raynaud's syndrome without gangrene: Secondary | ICD-10-CM | POA: Diagnosis not present

## 2017-11-13 DIAGNOSIS — D649 Anemia, unspecified: Secondary | ICD-10-CM | POA: Diagnosis not present

## 2017-11-13 DIAGNOSIS — E119 Type 2 diabetes mellitus without complications: Secondary | ICD-10-CM | POA: Diagnosis not present

## 2017-11-13 DIAGNOSIS — K529 Noninfective gastroenteritis and colitis, unspecified: Secondary | ICD-10-CM | POA: Diagnosis not present

## 2017-11-14 ENCOUNTER — Other Ambulatory Visit (INDEPENDENT_AMBULATORY_CARE_PROVIDER_SITE_OTHER): Payer: Self-pay | Admitting: Vascular Surgery

## 2017-11-14 DIAGNOSIS — Z22322 Carrier or suspected carrier of Methicillin resistant Staphylococcus aureus: Secondary | ICD-10-CM

## 2017-11-14 DIAGNOSIS — L97909 Non-pressure chronic ulcer of unspecified part of unspecified lower leg with unspecified severity: Secondary | ICD-10-CM

## 2017-11-14 HISTORY — DX: Carrier or suspected carrier of methicillin resistant Staphylococcus aureus: Z22.322

## 2017-11-15 ENCOUNTER — Encounter (INDEPENDENT_AMBULATORY_CARE_PROVIDER_SITE_OTHER): Payer: Self-pay | Admitting: Vascular Surgery

## 2017-11-15 ENCOUNTER — Ambulatory Visit (INDEPENDENT_AMBULATORY_CARE_PROVIDER_SITE_OTHER): Payer: Medicare HMO | Admitting: Vascular Surgery

## 2017-11-15 ENCOUNTER — Ambulatory Visit (INDEPENDENT_AMBULATORY_CARE_PROVIDER_SITE_OTHER): Payer: Medicare HMO

## 2017-11-15 DIAGNOSIS — K551 Chronic vascular disorders of intestine: Secondary | ICD-10-CM | POA: Diagnosis not present

## 2017-11-15 DIAGNOSIS — I10 Essential (primary) hypertension: Secondary | ICD-10-CM

## 2017-11-15 DIAGNOSIS — L97922 Non-pressure chronic ulcer of unspecified part of left lower leg with fat layer exposed: Secondary | ICD-10-CM | POA: Diagnosis not present

## 2017-11-15 DIAGNOSIS — K219 Gastro-esophageal reflux disease without esophagitis: Secondary | ICD-10-CM

## 2017-11-15 DIAGNOSIS — L97909 Non-pressure chronic ulcer of unspecified part of unspecified lower leg with unspecified severity: Secondary | ICD-10-CM | POA: Diagnosis not present

## 2017-11-15 DIAGNOSIS — I7025 Atherosclerosis of native arteries of other extremities with ulceration: Secondary | ICD-10-CM

## 2017-11-15 DIAGNOSIS — E7800 Pure hypercholesterolemia, unspecified: Secondary | ICD-10-CM | POA: Diagnosis not present

## 2017-11-18 ENCOUNTER — Encounter (INDEPENDENT_AMBULATORY_CARE_PROVIDER_SITE_OTHER): Payer: Self-pay | Admitting: Vascular Surgery

## 2017-11-18 DIAGNOSIS — E7800 Pure hypercholesterolemia, unspecified: Secondary | ICD-10-CM | POA: Insufficient documentation

## 2017-11-18 DIAGNOSIS — L97922 Non-pressure chronic ulcer of unspecified part of left lower leg with fat layer exposed: Secondary | ICD-10-CM | POA: Insufficient documentation

## 2017-11-18 DIAGNOSIS — K219 Gastro-esophageal reflux disease without esophagitis: Secondary | ICD-10-CM | POA: Insufficient documentation

## 2017-11-18 DIAGNOSIS — I70229 Atherosclerosis of native arteries of extremities with rest pain, unspecified extremity: Secondary | ICD-10-CM | POA: Insufficient documentation

## 2017-11-18 DIAGNOSIS — I10 Essential (primary) hypertension: Secondary | ICD-10-CM | POA: Insufficient documentation

## 2017-11-18 NOTE — Progress Notes (Signed)
MRN : [id number]  Melissa [name] is a 77 y.o. (Mar 29, 1941) female who presents with chief complaint of  Chief Complaint  Patient presents with  . Follow-up    ultrasound  .  History of Present Illness: The patient is seen for evaluation of painful lower extremities and diminished pulses associated with ulceration of the left foot.  The patient notes the left ulcer has been present for multiple weeks and has not been improving.  It is very painful and has had some drainage.  No specific history of trauma noted by the patient.  The patient denies fever or chills.  the patient does have diabetes which has been difficult to control.  Patient notes prior to the ulcer developing the extremities were painful particularly with ambulation or activity and the discomfort is very consistent day today. Typically, the pain occurs at less than one block, progress is as activity continues to the point that the patient must stop walking. Resting including standing still for several minutes allowed resumption of the activity and the ability to walk a similar distance before stopping again. Uneven terrain and inclined shorten the distance. The pain has been progressive over the past several years.   The patient denies rest pain or dangling of an extremity off the side of the bed during the night for relief. No prior interventions or surgeries.  No history of back problems or DJD of the lumbar sacral spine.   I am also asked to evaluate the patient for the complaint of abdominal pain with uncertain etiology.  The patient is followed by GI who is concerned about mesenteric ischemia.  The patient has noted some weight loss as well as nausea.  The patient does substantiate food fear, particular foods do not seem to aggravate or alleviate the symptoms.  The patient denies bloody bowel movements or diarrhea.  The patient has a history of colonoscopy which was not diagnostic.  No history of peptic ulcer disease.   No  prior peripheral angiograms or vascular interventions.  The patient denies amaurosis fugax or recent TIA symptoms. There are no recent neurological changes noted. The patient denies history of DVT, PE or superficial thrombophlebitis. The patient denies recent episodes of angina or shortness of breath.   ABI's Rt=[place] triphasic signals LT=[place] monophasic signals flat toe tracings (of note the left popliteal signal is triphasic)    Current Meds  Medication Sig  . acetaminophen (TYLENOL) 500 MG tablet Take 1,000 mg by mouth every 8 (eight) hours as needed for mild pain or headache.  Marland Kitchen apixaban (ELIQUIS) 5 MG TABS tablet Take 1 tablet (5 mg total) by mouth 2 (two) times daily.  Marland Kitchen aspirin EC 81 MG tablet Take 1 tablet (81 mg total) by mouth daily.  . budesonide (ENTOCORT EC) 3 MG 24 hr capsule Take 6 mg by mouth daily.   . Calcium Carbonate-Vitamin D (CALCIUM 600+D) 600-400 MG-UNIT tablet Take 1 tablet by mouth 2 (two) times daily.  . Cranberry 500 MG CAPS Take 500 mg by mouth daily.  Marland Kitchen diltiazem (CARDIZEM CD) 240 MG 24 hr capsule Take 240 mg by mouth daily.  Marland Kitchen estradiol (ESTRACE) 0.1 MG/GM vaginal cream Place 1 g vaginally 3 (three) times a week. (Patient taking differently: Place 1 g vaginally 2 (two) times a week. )  . ferrous sulfate 325 (65 FE) MG tablet Take 325 mg by mouth daily with breakfast.  . gemfibrozil (LOPID) 600 MG tablet Take 600 mg by mouth 2 (two) times daily  before a meal.  . hydroxychloroquine (PLAQUENIL) 200 MG tablet Take 200 mg by mouth 2 (two) times daily.   Marland Kitchen LORazepam (ATIVAN) 1 MG tablet Take 1 tablet (1 mg total) by mouth 2 (two) times daily as needed for anxiety.  Marland Kitchen losartan (COZAAR) 100 MG tablet Take 50 mg by mouth daily. Take 0.5 tablet  . Multiple Vitamin (MULTIVITAMIN) tablet Take 1 tablet by mouth daily.  . Multiple Vitamins-Minerals (PRESERVISION AREDS) CAPS Take 1 capsule by mouth 2 (two) times daily.  Marland Kitchen omeprazole (PRILOSEC) 20 MG capsule Take 20 mg by mouth 2  (two) times daily before a meal.   . pravastatin (PRAVACHOL) 80 MG tablet Take 80 mg by mouth at bedtime.   . predniSONE (DELTASONE) 5 MG tablet Take 5 mg by mouth daily with breakfast.  . sertraline (ZOLOFT) 50 MG tablet Take 50 mg by mouth at bedtime.   Marland Kitchen tiZANidine (ZANAFLEX) 4 MG tablet Take 4 mg by mouth daily as needed for muscle spasms (neck pain).     Past Medical History:  Diagnosis Date  . Anemia   . Anxiety   . Arthritis    rheumatoid  . Ataxia   . COPD (chronic obstructive pulmonary disease) ([place])   . Depression   . Diverticulosis   . Excessive falling   . GERD (gastroesophageal reflux disease)   . Hyperlipidemia   . Hypertension   . IBS (irritable bowel syndrome)   . Osteoarthritis   . Psoriasis   . Raynaud's disease without gangrene   . Raynaud's disease without gangrene   . Shortness of breath dyspnea    with exertion    Past Surgical History:  Procedure Laterality Date  . ABDOMINAL HYSTERECTOMY    . APPENDECTOMY    . BACK SURGERY     Spinal fusion with rods and screws  . CHOLECYSTECTOMY    . COLONOSCOPY WITH PROPOFOL N/A 01/29/2017   Procedure: COLONOSCOPY WITH PROPOFOL;  Surgeon: Manya Silvas, MD;  Location: [facility] ENDOSCOPY;  Service: Endoscopy;  Laterality: N/A;  . ESOPHAGOGASTRODUODENOSCOPY (EGD) WITH PROPOFOL N/A 01/29/2017   Procedure: ESOPHAGOGASTRODUODENOSCOPY (EGD) WITH PROPOFOL;  Surgeon: Manya Silvas, MD;  Location[facility] END[facility]COPY;  Service: Endoscopy;  Laterality: N/A;  . EYE SURGERY Bilateral    Cataract Extraction with IOL  . KNEE ARTHROSCOPY Left    partial lateral meniscectomy, debridement, excision of Baker's cyst  . LEFT HEART CATH AND CORONARY ANGIOGRAPHY N/A 04/23/2017   Procedure: LEFT HEART CATH AND CORONARY ANGIOGRAPHY;  Surgeon: Teodoro Spray, MD;  Location: [place] CV LAB;  Service: Cardiovascular;  Laterality: N/A;  . LEFT HEART CATH AND CORONARY ANGIOGRAPHY Left 09/07/2017   Procedure: LEFT HEART CATH AND CORONARY  ANGIOGRAPHY;  Surgeon: Teodoro Spray, MD;  Location: [place] CV LAB;  Service: Cardiovascular;  Laterality: Left;  . ROTATOR CUFF REPAIR Right   . TOTAL KNEE ARTHROPLASTY Left 04/04/2016   Procedure: TOTAL KNEE ARTHROPLASTY;  Surgeon: Hessie Knows, MD;  Location: [facility] ORS;  Service: Orthopedics;  Laterality: Left;    Social History Social History   Tobacco Use  . Smoking status: Never Smoker  . Smokeless tobacco: Never Used  Substance Use Topics  . Alcohol use: No  . Drug use: No    Family History Family History  Problem Relation Age of Onset  . CAD Mother   . CAD Father   . Breast cancer Neg Hx     Allergies  Allergen Reactions  . Lodine [Etodolac] Hives and Itching  .  Methotrexate Derivatives Other (See Comments)    Flu like symptoms  . Nitrofuran Derivatives Nausea And Vomiting    Made sicker   . Norvasc [Amlodipine] Other (See Comments)    Unknown   . Pentasa [Mesalamine Er] Other (See Comments)    Unknown   . Red Yeast Rice Extract Other (See Comments)    Patient preference   . Vioxx [Rofecoxib] Other (See Comments)    Unknown   . Levaquin [Levofloxacin] Itching, Rash and Other (See Comments)    Muscle pain  . Sulfa Antibiotics Itching and Rash     REVIEW OF SYSTEMS (Negative unless checked)  Constitutional: [] Weight loss  [] Fever  [] Chills Cardiac: [] Chest pain   [] Chest pressure   [] Palpitations   [] Shortness of breath when laying flat   [] Shortness of breath with exertion. Vascular:  [x] Pain in legs with walking   [x] Pain in legs at rest  [] History of DVT   [] Phlebitis   [] Swelling in legs   [] Varicose veins   [x] Non-healing ulcers Pulmonary:   [] Uses home oxygen   [] Productive cough   [] Hemoptysis   [] Wheeze  [] COPD   [] Asthma Neurologic:  [] Dizziness   [] Seizures   [x] History of stroke   [] History of TIA  [] Aphasia   [] Vissual changes   [] Weakness or numbness in arm   [] Weakness or numbness in leg Musculoskeletal:   [] Joint swelling   [] Joint pain    [] Low back pain Hematologic:  [] Easy bruising  [] Easy bleeding   [] Hypercoagulable state   [] Anemic Gastrointestinal:  [] Diarrhea   [x] abdominal pain  [x] Gastroesophageal reflux/heartburn   [] Difficulty swallowing. Genitourinary:  [] Chronic kidney disease   [] Difficult urination  [] Frequent urination   [] Blood in urine Skin:  [] Rashes   [x] Ulcers  Psychological:  [x] History of anxiety   [x]  History of major depression.  Physical Examination  Vitals:   11/15/17 1535  BP: (!) 148/61  Pulse: 74  Resp: 17  Weight: 127 lb (57.6 kg)  Height: 5' 4"  (1.626 m)   Body mass index is 21.8 kg/m. Gen: WD/WN, NAD Head: [place]/AT, No temporalis wasting.  Ear/Nose/Throat: Hearing grossly intact, nares w/o erythema or drainage Eyes: PER, EOMI, sclera nonicteric.  Neck: Supple, no large masses.   Pulmonary:  Good air movement, no audible wheezing bilaterally, no use of accessory muscles.  Cardiac: RRR, no JVD Vascular: left foot with ulcer fat exposed, no cellulitis.  slugish cap refill left foot. Vessel Right Left  Radial Palpable Palpable  Popliteal Not Palpable Not Palpable  PT Trace Palpable Not Palpable  DP Trace Palpable Not Palpable  Gastrointestinal: Non-distended. No guarding/no peritoneal signs.  Musculoskeletal: M/S 5/5 throughout.  No deformity or atrophy.  Neurologic: CN 2-12 intact. Symmetrical.  Speech is fluent. Motor exam as listed above. Psychiatric: Judgment intact, Mood & affect appropriate for pt's clinical situation. Dermatologic: + rashes + ulcers noted.  No changes consistent with cellulitis. Lymph : No lichenification or skin changes of chronic lymphedema.  CBC Lab Results  Component Value Date   WBC 16.9 (H) 04/23/2017   HGB 8.4 (L) 04/23/2017   HCT 25.6 (L) 04/23/2017   MCV 94.8 04/23/2017   PLT 253 04/23/2017    BMET    Component Value Date/Time   NA 142 04/24/2017 0440   NA 139 06/16/2013 1031   K 4.1 04/24/2017 0440   K 3.8 06/16/2013 1031   CL 103  04/24/2017 0440   CL 105 06/16/2013 1031   CO2 28 04/24/2017 0440   CO2 30 06/16/2013 1031  GLUCOSE 144 (H) 04/24/2017 0440   GLUCOSE 96 06/16/2013 1031   BUN 20 04/24/2017 0440   BUN 23 (H) 06/16/2013 1031   CREATININE 0.87 04/24/2017 0440   CREATININE 0.58 (L) 06/16/2013 1031   CALCIUM 9.7 04/24/2017 0440   CALCIUM 9.7 06/16/2013 1031   GFRNONAA >60 04/24/2017 0440   GFRNONAA >60 06/16/2013 1031   GFRAA >60 04/24/2017 0440   GFRAA >60 06/16/2013 1031   CrCl cannot be calculated (Patient's most recent lab result is older than the maximum 21 days allowed.).  COAG Lab Results  Component Value Date   INR 1.33 04/19/2017   INR 1.06 03/22/2016    Radiology No results found.  Assessment/Plan 1. Atherosclerosis of native arteries of the extremities with ulceration ([place])  Recommend:  The patient has evidence of severe atherosclerotic changes of both lower extremities associated with left foot ulceration and tissue loss of the foot.  This represents a limb threatening ischemia and places the patient at the risk for limb loss.  Patient should undergo left leg angiography of the left lower extremity with the hope for intervention for limb salvage.  The risks and benefits as well as the alternative therapies was discussed in detail with the patient.  All questions were answered.  Patient agrees to proceed with left leg angiography.  The patient will follow up with me in the office after the procedure.    A total of 65 minutes was spent with this patient and greater than 50% was spent in counseling and coordination of care with the patient.  Discussion included the treatment options for vascular disease including indications for surgery and intervention.  Also discussed is the appropriate timing of treatment.  In addition medical therapy was discussed.  2. 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 Chronic mesenteric ischemia ([place]) I will obtain a lateral  image of the visceral aorta at the time of angiogram  Risk and benefits were reviewed the patient.  Indications for the procedure were reviewed.  All questions were answered, the patient agrees to proceed.   4. Essential hypertension Continue antihypertensive medications as already ordered, these medications have been reviewed and there are no changes at this time.   5. Pure hypercholesterolemia Continue statin as ordered and reviewed, no changes at this time   6. Gastroesophageal reflux disease without esophagitis Continue antihypertensive medications as already ordered, these medications have been reviewed and there are no changes at this time.  Avoidence of caffeine and alcohol  Moderate elevation of the head of the bed    [name], MD  11/18/2017 11:50 AM

## 2017-11-19 ENCOUNTER — Encounter (INDEPENDENT_AMBULATORY_CARE_PROVIDER_SITE_OTHER): Payer: Self-pay

## 2017-11-19 DIAGNOSIS — M542 Cervicalgia: Secondary | ICD-10-CM | POA: Diagnosis not present

## 2017-11-19 DIAGNOSIS — D649 Anemia, unspecified: Secondary | ICD-10-CM | POA: Diagnosis not present

## 2017-11-19 DIAGNOSIS — M359 Systemic involvement of connective tissue, unspecified: Secondary | ICD-10-CM | POA: Diagnosis not present

## 2017-11-19 DIAGNOSIS — Z79899 Other long term (current) drug therapy: Secondary | ICD-10-CM | POA: Diagnosis not present

## 2017-11-19 DIAGNOSIS — I7300 Raynaud's syndrome without gangrene: Secondary | ICD-10-CM | POA: Diagnosis not present

## 2017-11-19 DIAGNOSIS — M7022 Olecranon bursitis, left elbow: Secondary | ICD-10-CM | POA: Diagnosis not present

## 2017-11-19 DIAGNOSIS — M810 Age-related osteoporosis without current pathological fracture: Secondary | ICD-10-CM | POA: Diagnosis not present

## 2017-11-21 DIAGNOSIS — L97522 Non-pressure chronic ulcer of other part of left foot with fat layer exposed: Secondary | ICD-10-CM | POA: Diagnosis not present

## 2017-11-26 ENCOUNTER — Other Ambulatory Visit (INDEPENDENT_AMBULATORY_CARE_PROVIDER_SITE_OTHER): Payer: Self-pay | Admitting: Vascular Surgery

## 2017-11-26 MED ORDER — CEFAZOLIN SODIUM-DEXTROSE 1-4 GM/50ML-% IV SOLN: 1.0000 g | Freq: Once | INTRAVENOUS | Status: DC

## 2017-11-27 ENCOUNTER — Encounter
Admission: RE | Disposition: A | Discharge status: Home / Self Care | Payer: Self-pay | Source: Ambulatory Visit | Attending: Vascular Surgery

## 2017-11-27 ENCOUNTER — Ambulatory Visit
Admission: RE | Admit: 2017-11-27 | Discharge: 2017-11-27 | Disposition: A | Discharge status: Home / Self Care | Payer: Medicare HMO | Source: Ambulatory Visit | Attending: Vascular Surgery | Admitting: Vascular Surgery

## 2017-11-27 DIAGNOSIS — I70201 Unspecified atherosclerosis of native arteries of extremities, right leg: Secondary | ICD-10-CM | POA: Diagnosis not present

## 2017-11-27 DIAGNOSIS — I739 Peripheral vascular disease, unspecified: Secondary | ICD-10-CM | POA: Diagnosis present

## 2017-11-27 DIAGNOSIS — L97909 Non-pressure chronic ulcer of unspecified part of unspecified lower leg with unspecified severity: Secondary | ICD-10-CM

## 2017-11-27 DIAGNOSIS — I70248 Atherosclerosis of native arteries of left leg with ulceration of other part of lower left leg: Secondary | ICD-10-CM | POA: Diagnosis not present

## 2017-11-27 DIAGNOSIS — L97829 Non-pressure chronic ulcer of other part of left lower leg with unspecified severity: Secondary | ICD-10-CM | POA: Insufficient documentation

## 2017-11-27 DIAGNOSIS — I70299 Other atherosclerosis of native arteries of extremities, unspecified extremity: Secondary | ICD-10-CM

## 2017-11-27 DIAGNOSIS — I70262 Atherosclerosis of native arteries of extremities with gangrene, left leg: Secondary | ICD-10-CM | POA: Insufficient documentation

## 2017-11-27 HISTORY — PX: LOWER EXTREMITY ANGIOGRAPHY: CATH118251

## 2017-11-27 LAB — CREATININE, SERUM
Creatinine, Ser: 0.57 mg/dL (ref 0.44–1.00)
GFR calc Af Amer: >60 mL/min (ref >60)
GFR calc non Af Amer: >60 mL/min (ref >60)

## 2017-11-27 LAB — BUN: BUN: 23 mg/dL — AB (ref 6–20)

## 2017-11-27 SURGERY — LOWER EXTREMITY ANGIOGRAPHY
Anesthesia: Moderate Sedation | Laterality: Left

## 2017-11-27 MED ORDER — HYDROMORPHONE HCL 1 MG/ML IJ SOLN
0.5000 mg | Freq: Once | INTRAMUSCULAR | Status: AC
  Administered 2017-11-27: 0.5 mg via INTRAVENOUS

## 2017-11-27 MED ORDER — SODIUM CHLORIDE 0.9 % IV SOLN
INTRAVENOUS | Status: DC
  Administered 2017-11-27: 08:00:00 via INTRAVENOUS

## 2017-11-27 MED ORDER — OXYCODONE HCL 5 MG PO TABS: 5.0000 mg | ORAL_TABLET | ORAL | Status: DC | PRN

## 2017-11-27 MED ORDER — FENTANYL CITRATE (PF) 100 MCG/2ML IJ SOLN
INTRAMUSCULAR | Status: DC | PRN
  Administered 2017-11-27: 50 ug via INTRAVENOUS

## 2017-11-27 MED ORDER — CEFAZOLIN SODIUM-DEXTROSE 2-4 GM/100ML-% IV SOLN
2.0000 g | Freq: Once | INTRAVENOUS | Status: AC
Start: 2017-11-27 — End: 2017-11-27
  Administered 2017-11-27: 2 g via INTRAVENOUS

## 2017-11-27 MED ORDER — HEPARIN (PORCINE) IN NACL 1000-0.9 UT/500ML-% IV SOLN
INTRAVENOUS | Status: AC
  Filled 2017-11-27: qty 1000

## 2017-11-27 MED ORDER — SODIUM CHLORIDE 0.9% FLUSH: 3.0000 mL | INTRAVENOUS | Status: DC | PRN

## 2017-11-27 MED ORDER — SODIUM CHLORIDE 0.9 % IV SOLN: 250.0000 mL | INTRAVENOUS | Status: DC | PRN

## 2017-11-27 MED ORDER — HEPARIN SODIUM (PORCINE) 1000 UNIT/ML IJ SOLN
INTRAMUSCULAR | Status: DC | PRN
  Administered 2017-11-27: 5000 [IU] via INTRAVENOUS

## 2017-11-27 MED ORDER — HYDRALAZINE HCL 20 MG/ML IJ SOLN: 5.0000 mg | INTRAMUSCULAR | Status: DC | PRN

## 2017-11-27 MED ORDER — ONDANSETRON HCL 4 MG/2ML IJ SOLN: 4.0000 mg | Freq: Four times a day (QID) | INTRAMUSCULAR | Status: DC | PRN

## 2017-11-27 MED ORDER — HYDROMORPHONE HCL 1 MG/ML IJ SOLN
INTRAMUSCULAR | Status: AC
  Filled 2017-11-27: qty 0.5

## 2017-11-27 MED ORDER — FAMOTIDINE 20 MG PO TABS: 40.0000 mg | ORAL_TABLET | ORAL | Status: DC | PRN

## 2017-11-27 MED ORDER — HEPARIN SODIUM (PORCINE) 1000 UNIT/ML IJ SOLN
INTRAMUSCULAR | Status: AC
  Filled 2017-11-27: qty 1

## 2017-11-27 MED ORDER — SODIUM CHLORIDE 0.9% FLUSH: 3.0000 mL | Freq: Two times a day (BID) | INTRAVENOUS | Status: DC

## 2017-11-27 MED ORDER — MORPHINE SULFATE (PF) 4 MG/ML IV SOLN: 2.0000 mg | INTRAVENOUS | Status: DC | PRN

## 2017-11-27 MED ORDER — HYDROMORPHONE HCL 1 MG/ML IJ SOLN: 1.0000 mg | Freq: Once | INTRAMUSCULAR | Status: DC | PRN

## 2017-11-27 MED ORDER — LABETALOL HCL 5 MG/ML IV SOLN: 10.0000 mg | INTRAVENOUS | Status: DC | PRN

## 2017-11-27 MED ORDER — MIDAZOLAM HCL 5 MG/5ML IJ SOLN
INTRAMUSCULAR | Status: AC
  Filled 2017-11-27: qty 10

## 2017-11-27 MED ORDER — METHYLPREDNISOLONE SODIUM SUCC 125 MG IJ SOLR: 125.0000 mg | INTRAMUSCULAR | Status: DC | PRN

## 2017-11-27 MED ORDER — SODIUM CHLORIDE 0.9 % IV SOLN: INTRAVENOUS | Status: DC

## 2017-11-27 MED ORDER — ACETAMINOPHEN 325 MG PO TABS: 650.0000 mg | ORAL_TABLET | ORAL | Status: DC | PRN

## 2017-11-27 MED ORDER — FENTANYL CITRATE (PF) 100 MCG/2ML IJ SOLN
INTRAMUSCULAR | Status: AC
  Filled 2017-11-27: qty 4

## 2017-11-27 MED ORDER — IOPAMIDOL (ISOVUE-300) INJECTION 61%
INTRAVENOUS | Status: DC | PRN
  Administered 2017-11-27: 85 mL via INTRAVENOUS

## 2017-11-27 MED ORDER — MIDAZOLAM HCL 2 MG/2ML IJ SOLN
INTRAMUSCULAR | Status: DC | PRN
  Administered 2017-11-27: 1 mg via INTRAVENOUS
  Administered 2017-11-27: 2 mg via INTRAVENOUS

## 2017-11-27 MED ORDER — CLOPIDOGREL BISULFATE 75 MG PO TABS: 75.0000 mg | ORAL_TABLET | Freq: Every day | ORAL | 4 refills | Status: DC

## 2017-11-27 MED ORDER — LIDOCAINE HCL (PF) 1 % IJ SOLN
INTRAMUSCULAR | Status: AC
  Filled 2017-11-27: qty 30

## 2017-11-27 SURGICAL SUPPLY — 29 items
BALLN STERLING OTW 2X60X150 (BALLOONS) ×3
BALLN ULTRVRSE 2X300X150 (BALLOONS) ×2
BALLN ULTRVRSE 2X300X150 OTW (BALLOONS) ×1
BALLOON STERLING OTW 2X60X150 (BALLOONS) ×1 IMPLANT
BALLOON ULTRVRSE 2X300X150 OTW (BALLOONS) ×1 IMPLANT
CATH CROSSER S6 154CM (CATHETERS) ×3 IMPLANT
CATH CXI SUPP ST 2.6FR 150CM (CATHETERS) ×3 IMPLANT
CATH PIG 70CM (CATHETERS) ×3 IMPLANT
CATH USHER TPER 130CM (CATHETERS) ×3 IMPLANT
CATH VERT 5FR 125CM (CATHETERS) ×6 IMPLANT
DEVICE PRESTO INFLATION (MISCELLANEOUS) ×3 IMPLANT
DEVICE STARCLOSE SE CLOSURE (Vascular Products) ×3 IMPLANT
DEVICE TORQUE .014-.018 (MISCELLANEOUS) ×1 IMPLANT
DEVICE TORQUE .025-.038 (MISCELLANEOUS) ×3 IMPLANT
GLIDEWIRE ADV .014X300CM (WIRE) ×3 IMPLANT
GUIDEWIRE PFTE-COATED .018X300 (WIRE) ×3 IMPLANT
KIT FLOWMATE PROCEDURAL (KITS) ×3 IMPLANT
NEEDLE ENTRY 21GA 7CM ECHOTIP (NEEDLE) ×3 IMPLANT
PACK ANGIOGRAPHY (CUSTOM PROCEDURE TRAY) ×3 IMPLANT
SET INTRO CAPELLA COAXIAL (SET/KITS/TRAYS/PACK) ×3 IMPLANT
SHEATH BRITE TIP 5FRX11 (SHEATH) ×3 IMPLANT
SHEATH RAABE 6FR (SHEATH) ×3 IMPLANT
SYR MEDRAD MARK V 150ML (SYRINGE) ×3 IMPLANT
TORQUE DEVICE .014-.018 (MISCELLANEOUS) ×3
TUBING CONTRAST HIGH PRESS 72 (TUBING) ×3 IMPLANT
WIRE AQUATRACK .035X260CM (WIRE) ×3 IMPLANT
WIRE G V18X300CM (WIRE) ×3 IMPLANT
WIRE J 3MM .035X145CM (WIRE) ×3 IMPLANT
WIRE SPARTACORE .014X300CM (WIRE) ×6 IMPLANT

## 2017-11-27 NOTE — Op Note (Signed)
[facility] Percutaneous Study/Intervention Procedural Note   Date of Surgery: 11/27/2017  Surgeon:  Katha Cabal, MD.  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 Introduction catheter into left lower extremity 3rd order catheter placement  2. Contrast injection left lower extremity for distal runoff   3. Crosser atherectomy of the left posterior tibial artery 4. Percutaneous transluminal angioplasty left posterior tibial artery with a 3 x 30 Ultraverse balloon             5.  Star close closure right common femoral arteriotomy                Anesthesia: Conscious sedation was administered under my direct supervision by the interventional radiology RN. IV Versed plus fentanyl were utilized. Continuous ECG, pulse oximetry and blood pressure was monitored throughout the entire procedure. Conscious sedation was for a total of 131 minutes.  Sheath: 6 French Raby right common femoral  Contrast: 85 cc  Fluoroscopy Time: 25.4 minutes  Indications: Melissa [name] presents with atherosclerotic occlusive disease bilateral lower extremities. The patient has developed ulceration and gangrene of the soft tissues of the left lower extremity. This places the patient at high risk for limb loss and amputation. The risks and benefits are reviewed all questions answered patient agrees to proceed.  Procedure: Melissa [name] is a 77 y.o. y.o. female who was identified and appropriate procedural time out was performed. The patient was then placed supine on the table and prepped and draped in the usual sterile fashion.   Ultrasound was placed in the sterile sleeve and the right groin was evaluated the right common femoral artery was echolucent and pulsatile indicating patency.   Image was recorded for the permanent record and under real-time visualization a microneedle was inserted into the common femoral artery microwire followed by a micro-sheath.  A J-wire was then advanced through the micro-sheath and a  5 [place] sheath was then inserted over a J-wire. J-wire was then advanced and a 5 French pigtail catheter was positioned at the level of T12. AP projection of the aorta was then obtained. Pigtail catheter was repositioned to above the bifurcation and a RAO view of the pelvis was obtained.  Subsequently a pigtail catheter with the stiff angle Glidewire was used to cross the aortic bifurcation the catheter wire were advanced down into the left distal external iliac artery. Oblique view of the femoral bifurcation was then obtained and subsequently the wire was reintroduced and the pigtail catheter negotiated into the SFA representing third order catheter placement. Distal runoff was then performed.  5000 units of heparin was then given and allowed to circulate and a 6 [place] Raby sheath was advanced up and over the bifurcation and positioned in the common femoral artery.  Wire and catheter were then used to negotiate the SFA stenosis and the catheter was then positioned in the distal popliteal where magnified imaging of the trifurcation was performed.  The S6 Crosser catheter was then prepped on the field and a straight Usher catheter was advanced into the cul-de-sac of the posterior tibial under magnified imaging. Using the Crosser catheter multiple occlusions of the proximal and mid posterior tibial artery was negotiated.  Injection of contrast confirmed intraluminal positioning.  CXI catheter and a 0.014 Advantage wire were then advanced down into the distal tibial.  Distal runoff was then completed by hand injection through the  catheter verifying distal runoff.  A 3 mm x 30 cm Ultraverse balloon was then used to angioplasty the posterior tibial artery. Inflations were to 10-12  atm for 1 full minute. Follow-up imaging demonstrated patency of the posterior tibial artery throughout the treated portion.  Distal runoff was then reassessed.  This shows that there is profound small vessel disease intrinsic to the foot pedal arch is poorly visualized digital arteries are not visualized dorsalis pedis and lateral plantar appear only in segments.  From a surgical perspective this would be non-reconstructable.  After review of these images the sheath is pulled into the right external iliac oblique of the common femoral is obtained and a Star close device deployed. There no immediate Complications.  Findings: The abdominal aorta is opacified with a bolus injection contrast. Renal arteries are patent. The aorta itself has diffuse disease but no hemodynamically significant lesions. The common and external iliac arteries are widely patent bilaterally.  The right common femoral is widely patent as is the profunda femoris.  The SFA demonstrates diffuse disease but no hemodynamically significant lesions.  The popliteal artery demonstrates diffuse disease but no hemodynamically significant lesions.  An extremely lateral view with the patient's knee somewhat flexed was obtained in order to get her knee replacement out of the path of the artery.  The trifurcation is heavily diseased with occlusion of the anterior tibial and posterior tibial.  After crosser atherectomy and angioplasty the posterior tibial is improved with less than 5% residual stenosis throughout the treated segment although distally at the level of the lateral plantar there is still significant disease.  As noted above from the forefoot to the toes there is profound small vessel disease with poor filling of the pedal arch and digital vessels.  Lateral plantar medial plantar and dorsalis pedis arteries are visualized only in small segments.  From a surgical point of view this patient would be non-reconstructable.  Following  angioplasty posterior tibial now is patent and looks much better with less than 5% residual stenosis throughout the treated segment.  Given the findings in the forefoot and toes in association with the ulcerations of several of her toes this initial treatment may not be sufficient to allow for healing.  I have discussed this with the patient and her daughter.  Should we need to return for further intervention then clearly pedal access would be required and attempts at recanalization of the dorsalis pedis as well as the lateral plantars would be part of that procedure.   Disposition: Patient was taken to the recovery room in stable condition having tolerated the procedure well.  Melissa [name] 11/27/2017,3:35 PM

## 2017-11-27 NOTE — H&P (Signed)
[facility] History & Physical Update  The patient was interviewed and re-examined.  The patient's previous History and Physical has been reviewed and is unchanged.  There is no change in the plan of care. We plan to proceed with the scheduled procedure.  [name], MD  11/27/2017, 9:19 AM

## 2017-11-27 NOTE — Discharge Instructions (Signed)
Moderate Conscious Sedation, Adult, Care After These instructions provide you with information about caring for yourself after your procedure. Your health care provider may also give you more specific instructions. Your treatment has been planned according to current medical practices, but problems sometimes occur. Call your health care provider if you have any problems or questions after your procedure. What can I expect after the procedure? After your procedure, it is common:  To feel sleepy for several hours.  To feel clumsy and have poor balance for several hours.  To have poor judgment for several hours.  To vomit if you eat too soon.  Follow these instructions at home: For at least 24 hours after the procedure:   Do not: ? Participate in activities where you could fall or become injured. ? Drive. ? Use heavy machinery. ? Drink alcohol. ? Take sleeping pills or medicines that cause drowsiness. ? Make important decisions or sign legal documents. ? Take care of children on your own.  Rest. Eating and drinking  Follow the diet recommended by your health care provider.  If you vomit: ? Drink water, juice, or soup when you can drink without vomiting. ? Make sure you have little or no nausea before eating solid foods. General instructions  Have a responsible adult stay with you until you are awake and alert.  Take over-the-counter and prescription medicines only as told by your health care provider.  If you smoke, do not smoke without supervision.  Keep all follow-up visits as told by your health care provider. This is important. Contact a health care provider if:  You keep feeling nauseous or you keep vomiting.  You feel light-headed.  You develop a rash.  You have a fever. Get help right away if:  You have trouble breathing. This information is not intended to replace advice given to you by your health care provider. Make sure you discuss any questions you have  with your health care provider. Document Released: 04/23/2013 Document Revised: 12/06/2015 Document Reviewed: 10/23/2015 Elsevier Interactive Patient Education  2018 [place]. Femoral Site Care Refer to this sheet in the next few weeks. These instructions provide you with information about caring for yourself after your procedure. Your health care provider may also give you more specific instructions. Your treatment has been planned according to current medical practices, but problems sometimes occur. Call your health care provider if you have any problems or questions after your procedure. What can I expect after the procedure? After your procedure, it is typical to have the following:  Bruising at the site that usually fades within 1-2 weeks.  Blood collecting in the tissue (hematoma) that may be painful to the touch. It should usually decrease in size and tenderness within 1-2 weeks.  Follow these instructions at home:  Take medicines only as directed by your health care provider.  You may shower 24-48 hours after the procedure or as directed by your health care provider. Remove the bandage (dressing) and gently wash the site with plain soap and water. Pat the area dry with a clean towel. Do not rub the site, because this may cause bleeding.  Do not take baths, swim, or use a hot tub until your health care provider approves.  Check your insertion site every day for redness, swelling, or drainage.  Do not apply powder or lotion to the site.  Limit use of stairs to twice a day for the first 2-3 days or as directed by your health care provider.  Do  not squat for the first 2-3 days or as directed by your health care provider.  Do not lift over 10 lb (4.5 kg) for 5 days after your procedure or as directed by your health care provider.  Ask your health care provider when it is okay to: ? Return to work or school. ? Resume usual physical activities or sports. ? Resume sexual  activity.  Do not drive home if you are discharged the same day as the procedure. Have someone else drive you.  You may drive 24 hours after the procedure unless otherwise instructed by your health care provider.  Do not operate machinery or power tools for 24 hours after the procedure or as directed by your health care provider.  If your procedure was done as an outpatient procedure, which means that you went home the same day as your procedure, a responsible adult should be with you for the first 24 hours after you arrive home.  Keep all follow-up visits as directed by your health care provider. This is important. Contact a health care provider if:  You have a fever.  You have chills.  You have increased bleeding from the site. Hold pressure on the site. Get help right away if:  You have unusual pain at the site.  You have redness, warmth, or swelling at the site.  You have drainage (other than a small amount of blood on the dressing) from the site.  The site is bleeding, and the bleeding does not stop after 30 minutes of holding steady pressure on the site.  Your leg or foot becomes pale, cool, tingly, or numb. This information is not intended to replace advice given to you by your health care provider. Make sure you discuss any questions you have with your health care provider. Document Released: 03/06/2014 Document Revised: 12/09/2015 Document Reviewed: 01/20/2014 Elsevier Interactive Patient Education  [employer].

## 2017-11-28 ENCOUNTER — Encounter: Payer: Self-pay | Admitting: Vascular Surgery

## 2017-11-30 ENCOUNTER — Encounter (INDEPENDENT_AMBULATORY_CARE_PROVIDER_SITE_OTHER): Payer: Self-pay

## 2017-11-30 ENCOUNTER — Encounter (INDEPENDENT_AMBULATORY_CARE_PROVIDER_SITE_OTHER): Payer: Self-pay | Admitting: Vascular Surgery

## 2017-11-30 ENCOUNTER — Telehealth (INDEPENDENT_AMBULATORY_CARE_PROVIDER_SITE_OTHER): Payer: Self-pay

## 2017-11-30 ENCOUNTER — Ambulatory Visit (INDEPENDENT_AMBULATORY_CARE_PROVIDER_SITE_OTHER): Payer: Medicare HMO | Admitting: Vascular Surgery

## 2017-11-30 VITALS — BP 134/71 | HR 73 | Resp 16 | Ht 64.0 in | Wt 122.8 lb

## 2017-11-30 DIAGNOSIS — I10 Essential (primary) hypertension: Secondary | ICD-10-CM

## 2017-11-30 DIAGNOSIS — I7025 Atherosclerosis of native arteries of other extremities with ulceration: Secondary | ICD-10-CM

## 2017-11-30 DIAGNOSIS — E7800 Pure hypercholesterolemia, unspecified: Secondary | ICD-10-CM

## 2017-11-30 MED ORDER — OXYCODONE-ACETAMINOPHEN 5-325 MG PO TABS: ORAL_TABLET | ORAL | 0 refills | Status: DC

## 2017-11-30 MED ORDER — GABAPENTIN 300 MG PO CAPS: 300.0000 mg | ORAL_CAPSULE | Freq: Every day | ORAL | 0 refills | Status: DC

## 2017-11-30 NOTE — Progress Notes (Signed)
Subjective:    Patient ID: Melissa [name], female    DOB: July 26, 1940, 77 y.o.   MRN: [id number] Chief Complaint  Patient presents with  . Follow-up    cold foot   Patient presents sooner than her scheduled follow-up appointment for worsening left lower extremity pain.  The patient is status post a left lower extremity angiogram on Nov 27, 2017.  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  The patient does have ulcerations to the first and second toes which are nonhealing.  The patient notes progressively worsening numbness and tingling to the left foot.  Patient notes that her toes are white.  The patient continues to take aspirin 81 mg daily, Eliquis 5 mg p.o. twice daily and Plavix 75 mg daily.  The patient denies any new ulcer formation.  The patient's rest pain is stable.  As per Dr. Nino Parsley last note, the patient will need a second intervention requiring a pedal approach.  Patient denies any drainage from her ulceration.  Patient denies any erythema to her left lower extremity.  Patient denies any fever, nausea vomiting.  Review of Systems  Constitutional: Negative.   HENT: Negative.   Eyes: Negative.   Respiratory: Negative.   Cardiovascular:       LLE Pain  Gastrointestinal: Negative.   Endocrine: Negative.   Genitourinary: Negative.   Musculoskeletal: Negative.   Skin: Positive for wound.  Allergic/Immunologic: Negative.   Neurological: Negative.   Hematological: Negative.   Psychiatric/Behavioral: Negative.       Objective:   Physical Exam  Constitutional: She is oriented to person, place, and time. She appears well-developed and well-nourished. No distress.  HENT:  Head: Normocephalic and atraumatic.  Right Ear: External ear normal.  Left Ear: External ear normal.  Eyes: Pupils are equal, round, and reactive to light. Conjunctivae and EOM are normal.  Neck: Normal range of motion.  Cardiovascular: Normal rate, regular rhythm,  normal heart sounds and intact distal pulses.  Pulses:      Radial pulses are 2+ on the right side, and 2+ on the left side.  Unable to palpate pedal pulses. Left lower extremity: Warm until about mid foot then becomes cooler.  Toes are white. Ulcer are noted to the first and second toe.  Motor/sensory is intact.  You were able to palpate the extremity without the patient being in pain.  Pulmonary/Chest: Effort normal and breath sounds normal.  Musculoskeletal: She exhibits no edema.  Neurological: She is alert and oriented to person, place, and time.  Skin: She is not diaphoretic.  Ulcer noted to left big toe and second toe  Psychiatric: She has a normal mood and affect. Her behavior is normal. Judgment and thought content normal.  Vitals reviewed.  BP 134/71 (BP Location: Right Arm)   Pulse 73   Resp 16   Ht 5' 4"  (1.626 m)   Wt 122 lb 12.8 oz (55.7 kg)   BMI 21.08 kg/m   Past Medical History:  Diagnosis Date  . Anemia   . Anxiety   . Arthritis    rheumatoid  . Ataxia   . COPD (chronic obstructive pulmonary disease) ([place])   . Depression   . Diverticulosis   . Excessive falling   . GERD (gastroesophageal reflux disease)   . Hyperlipidemia   . Hypertension   . IBS (irritable bowel syndrome)   . Osteoarthritis   . Psoriasis   . Raynaud's disease without gangrene   .  Raynaud's disease without gangrene   . Shortness of breath dyspnea    with exertion   Social History   Socioeconomic History  . Marital status: Married    Spouse name: Not on file  . Number of children: Not on file  . Years of education: Not on file  . Highest education level: Not on file  Occupational History  . Not on file  Social Needs  . Financial resource strain: Not on file  . Food insecurity:    Worry: Not on file    Inability: Not on file  . Transportation needs:    Medical: Not on file    Non-medical: Not on file  Tobacco Use  . Smoking status: Never Smoker  . Smokeless tobacco: Never  Used  Substance and Sexual Activity  . Alcohol use: No  . Drug use: No  . Sexual activity: Not on file  Lifestyle  . Physical activity:    Days per week: Not on file    Minutes per session: Not on file  . Stress: Not on file  Relationships  . Social connections:    Talks on phone: Not on file    Gets together: Not on file    Attends religious service: Not on file    Active member of club or organization: Not on file    Attends meetings of clubs or organizations: Not on file    Relationship status: Not on file  . Intimate partner violence:    Fear of current or ex partner: Not on file    Emotionally abused: Not on file    Physically abused: Not on file    Forced sexual activity: Not on file  Other Topics Concern  . Not on file  Social History Narrative  . Not on file   Past Surgical History:  Procedure Laterality Date  . ABDOMINAL HYSTERECTOMY    . APPENDECTOMY    . BACK SURGERY     Spinal fusion with rods and screws  . CHOLECYSTECTOMY    . COLONOSCOPY WITH PROPOFOL N/A 01/29/2017   Procedure: COLONOSCOPY WITH PROPOFOL;  Surgeon: Manya Silvas, MD;  Location: [facility] ENDOSCOPY;  Service: Endoscopy;  Laterality: N/A;  . ESOPHAGOGASTRODUODENOSCOPY (EGD) WITH PROPOFOL N/A 01/29/2017   Procedure: ESOPHAGOGASTRODUODENOSCOPY (EGD) WITH PROPOFOL;  Surgeon: Manya Silvas, MD;  Location: [facility] ENDOSCOPY;  Service: Endoscopy;  Laterality: N/A;  . EYE SURGERY Bilateral    Cataract Extraction with IOL  . KNEE ARTHROSCOPY Left    partial lateral meniscectomy, debridement, excision of Baker's cyst  . LEFT HEART CATH AND CORONARY ANGIOGRAPHY N/A 04/23/2017   Procedure: LEFT HEART CATH AND CORONARY ANGIOGRAPHY;  Surgeon: Teodoro Spray, MD;  Location: [place] CV LAB;  Service: Cardiovascular;  Laterality: N/A;  . LEFT HEART CATH AND CORONARY ANGIOGRAPHY Left 09/07/2017   Procedure: LEFT HEART CATH AND CORONARY ANGIOGRAPHY;  Surgeon: Teodoro Spray, MD;  Location: [place] CV  LAB;  Service: Cardiovascular;  Laterality: Left;  . LOWER EXTREMITY ANGIOGRAPHY Left 11/27/2017   Procedure: LOWER EXTREMITY ANGIOGRAPHY;  Surgeon: Katha Cabal, MD;  Location: [place] [facility] LAB;  Service: Cardiovascular;  Laterality: Left;  . ROTATOR CUFF REPAIR Right   . TOTAL KNEE ARTHROPLASTY Left 04/04/2016   Procedure: TOTAL KNEE ARTHROPLASTY;  Surgeon: Hessie Knows, MD;  Location: [facility] ORS;  Service: Orthopedics;  Laterality: Left;   Family History  Problem Relation Age of Onset  . CAD Mother   . CAD Father   . Breast cancer  Neg Hx    Allergies  Allergen Reactions  . Lodine [Etodolac] Hives and Itching  . Methotrexate Derivatives Other (See Comments)    Flu like symptoms  . Nitrofuran Derivatives Nausea And Vomiting and Other (See Comments)    Made sicker   . Norvasc [Amlodipine] Other (See Comments)    Unknown   . Pentasa [Mesalamine Er] Other (See Comments)    Unknown   . Red Yeast Rice Extract Other (See Comments)    Patient preference   . Vioxx [Rofecoxib] Other (See Comments)    Unknown   . Levaquin [Levofloxacin] Itching, Rash and Other (See Comments)    Muscle pain  . Sulfa Antibiotics Itching and Rash      Assessment & Plan:  Patient presents sooner than her scheduled follow-up appointment for worsening left lower extremity pain.  The patient is status post a left lower extremity angiogram on Nov 27, 2017.  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  The patient does have ulcerations to the first and second toes which are nonhealing.  The patient notes progressively worsening numbness and tingling to the left foot.  Patient notes that her toes are white.  The patient continues to take aspirin 81 mg daily, Eliquis 5 mg p.o. twice daily and Plavix 75 mg daily.  The patient denies any new ulcer formation.  The patient's rest pain is stable.  As per Dr. Nino Parsley last note, the patient will need a second intervention  requiring a pedal approach.  Patient denies any drainage from her ulceration.  Patient denies any erythema to her left lower extremity.  Patient denies any fever, nausea vomiting.  1. Atherosclerosis of native arteries of the extremities with ulceration ([place]) - Stable I do not feel that the patient is presenting with any acute ischemia. I feel that this is chronic ischemia. The patient is already on aspirin, Eliquis and Plavix and do not feel comfortable adding any additional blood thinners at this time.  This would be a risk for bleeding I have prescribed Neurontin for the patient to start taking daily.  300 mg 1 tab daily by mouth. I also gave the patient a prescription for Percocet to take if her pain worsens as the first available appointment is next Wednesday with Dr. Delana Meyer. The patient was strongly advised to seek medical attention at the ED if she should experience any worsening pain, fever, nausea vomiting. The patient expresses her understanding Recommend a left lower extremity angiogram from a pedal access to assess the patient's anatomy and degree of contributing peripheral artery disease.  If appropriate attempt to revascularize leg can be made at that time. Procedure, risks and benefits explained to the patient All questions answered The patient wishes to proceed  2. Pure hypercholesterolemia - Stable Encouraged good control as its slows the progression of atherosclerotic disease  3. Essential hypertension - Stable Encouraged good control as its slows the progression of atherosclerotic disease  Current Outpatient Medications on File Prior to Visit  Medication Sig Dispense Refill  . acetaminophen (TYLENOL) 500 MG tablet Take 1,000 mg by mouth every 8 (eight) hours as needed for mild pain or headache.    Marland Kitchen apixaban (ELIQUIS) 5 MG TABS tablet Take 1 tablet (5 mg total) by mouth 2 (two) times daily. 60 tablet   . aspirin EC 81 MG tablet Take 1 tablet (81 mg total) by mouth daily.     . budesonide (ENTOCORT EC) 3 MG 24 hr  capsule Take 3-6 mg by mouth See admin instructions. Pt takes 3 mg one day, 6 mg the next alternating doses    . Calcium Carbonate-Vitamin D (CALCIUM 600+D) 600-400 MG-UNIT tablet Take 1 tablet by mouth 2 (two) times daily.    . cephALEXin (KEFLEX) 500 MG capsule Take 500 mg by mouth 3 (three) times daily. For 10 days - starting 11/21/2017    . clopidogrel (PLAVIX) 75 MG tablet Take 1 tablet (75 mg total) by mouth daily. 30 tablet 4  . Cranberry 500 MG CAPS Take 500 mg by mouth daily.    Marland Kitchen diltiazem (CARDIZEM CD) 240 MG 24 hr capsule Take 240 mg by mouth daily.    . ferrous sulfate 325 (65 FE) MG tablet Take 325 mg by mouth daily with breakfast.    . gemfibrozil (LOPID) 600 MG tablet Take 600 mg by mouth 2 (two) times daily before a meal.    . hydroxychloroquine (PLAQUENIL) 200 MG tablet Take 200 mg by mouth 2 (two) times daily.     Marland Kitchen leflunomide (ARAVA) 20 MG tablet Take 20 mg by mouth daily.    Marland Kitchen lisinopril (PRINIVIL,ZESTRIL) 10 MG tablet Take 10 mg by mouth daily.    Marland Kitchen LORazepam (ATIVAN) 1 MG tablet Take 1 tablet (1 mg total) by mouth 2 (two) times daily as needed for anxiety. 10 tablet 0  . Multiple Vitamin (MULTIVITAMIN) tablet Take 1 tablet by mouth daily.    . Multiple Vitamins-Minerals (PRESERVISION AREDS) CAPS Take 1 capsule by mouth 2 (two) times daily.    Marland Kitchen omeprazole (PRILOSEC) 20 MG capsule Take 20 mg by mouth 2 (two) times daily before a meal.     . pravastatin (PRAVACHOL) 80 MG tablet Take 80 mg by mouth at bedtime.     . predniSONE (DELTASONE) 5 MG tablet Take 5 mg by mouth daily with breakfast.    . sertraline (ZOLOFT) 50 MG tablet Take 50 mg by mouth at bedtime.     Marland Kitchen tiZANidine (ZANAFLEX) 4 MG tablet Take 4 mg by mouth daily as needed for muscle spasms (neck pain).     Marland Kitchen estradiol (ESTRACE) 0.1 MG/GM vaginal cream Place 1 g vaginally 3 (three) times a week. (Patient not taking: Reported on 11/27/2017) 42.5 g 12   No current  facility-administered medications on file prior to visit.    There are no Patient Instructions on file for this visit. No follow-ups on file.  Sanyiah Kanzler A Farrin Shadle, PA-C

## 2017-11-30 NOTE — Telephone Encounter (Signed)
Patient called stating that she just had a procedure done on this past Tuesday with Dr. Delana Meyer, but with him being unavailable today I need some advisement on her situation.  She states that her foot is really "tingly, Cold, and numb". She wants to know what she needs to do today?

## 2017-12-03 ENCOUNTER — Other Ambulatory Visit (INDEPENDENT_AMBULATORY_CARE_PROVIDER_SITE_OTHER): Payer: Self-pay | Admitting: Vascular Surgery

## 2017-12-04 ENCOUNTER — Telehealth (INDEPENDENT_AMBULATORY_CARE_PROVIDER_SITE_OTHER): Payer: Self-pay

## 2017-12-04 MED ORDER — CEFAZOLIN SODIUM-DEXTROSE 2-4 GM/100ML-% IV SOLN
2.0000 g | Freq: Once | INTRAVENOUS | Status: AC
  Administered 2017-12-05: 2 g via INTRAVENOUS

## 2017-12-04 NOTE — Telephone Encounter (Signed)
Patient was inform with advice from [facility]

## 2017-12-04 NOTE — Telephone Encounter (Signed)
Yes, the patient should move forward with her lower extremity angiogram.  You can tell her that I have even spoken to Dr. Delana Meyer about it and he knows what to expect.

## 2017-12-05 ENCOUNTER — Other Ambulatory Visit: Payer: Self-pay

## 2017-12-05 ENCOUNTER — Inpatient Hospital Stay
Admission: RE | Admit: 2017-12-05 | Discharge: 2017-12-19 | DRG: 239 | Disposition: A | Discharge status: Skilled Nursing Facility | Payer: Medicare HMO | Source: Ambulatory Visit | Attending: Vascular Surgery | Admitting: Vascular Surgery

## 2017-12-05 ENCOUNTER — Encounter
Admission: RE | Disposition: A | Discharge status: Skilled Nursing Facility | Payer: Self-pay | Source: Ambulatory Visit | Attending: Vascular Surgery

## 2017-12-05 DIAGNOSIS — I999 Unspecified disorder of circulatory system: Secondary | ICD-10-CM | POA: Diagnosis present

## 2017-12-05 DIAGNOSIS — T8144XA Sepsis following a procedure, initial encounter: Secondary | ICD-10-CM | POA: Diagnosis not present

## 2017-12-05 DIAGNOSIS — I998 Other disorder of circulatory system: Secondary | ICD-10-CM | POA: Diagnosis not present

## 2017-12-05 DIAGNOSIS — Z981 Arthrodesis status: Secondary | ICD-10-CM

## 2017-12-05 DIAGNOSIS — J9811 Atelectasis: Secondary | ICD-10-CM | POA: Diagnosis not present

## 2017-12-05 DIAGNOSIS — R197 Diarrhea, unspecified: Secondary | ICD-10-CM | POA: Diagnosis not present

## 2017-12-05 DIAGNOSIS — Z96652 Presence of left artificial knee joint: Secondary | ICD-10-CM | POA: Diagnosis present

## 2017-12-05 DIAGNOSIS — R6521 Severe sepsis with septic shock: Secondary | ICD-10-CM | POA: Diagnosis not present

## 2017-12-05 DIAGNOSIS — E876 Hypokalemia: Secondary | ICD-10-CM | POA: Diagnosis not present

## 2017-12-05 DIAGNOSIS — I7025 Atherosclerosis of native arteries of other extremities with ulceration: Principal | ICD-10-CM | POA: Diagnosis present

## 2017-12-05 DIAGNOSIS — Z7401 Bed confinement status: Secondary | ICD-10-CM | POA: Diagnosis not present

## 2017-12-05 DIAGNOSIS — J189 Pneumonia, unspecified organism: Secondary | ICD-10-CM | POA: Diagnosis not present

## 2017-12-05 DIAGNOSIS — D508 Other iron deficiency anemias: Secondary | ICD-10-CM | POA: Diagnosis not present

## 2017-12-05 DIAGNOSIS — Z882 Allergy status to sulfonamides status: Secondary | ICD-10-CM | POA: Diagnosis not present

## 2017-12-05 DIAGNOSIS — I959 Hypotension, unspecified: Secondary | ICD-10-CM | POA: Diagnosis not present

## 2017-12-05 DIAGNOSIS — I70262 Atherosclerosis of native arteries of extremities with gangrene, left leg: Secondary | ICD-10-CM | POA: Diagnosis not present

## 2017-12-05 DIAGNOSIS — T40605A Adverse effect of unspecified narcotics, initial encounter: Secondary | ICD-10-CM | POA: Diagnosis not present

## 2017-12-05 DIAGNOSIS — I96 Gangrene, not elsewhere classified: Secondary | ICD-10-CM | POA: Diagnosis not present

## 2017-12-05 DIAGNOSIS — K219 Gastro-esophageal reflux disease without esophagitis: Secondary | ICD-10-CM | POA: Diagnosis present

## 2017-12-05 DIAGNOSIS — Z5189 Encounter for other specified aftercare: Secondary | ICD-10-CM | POA: Diagnosis not present

## 2017-12-05 DIAGNOSIS — L97909 Non-pressure chronic ulcer of unspecified part of unspecified lower leg with unspecified severity: Secondary | ICD-10-CM

## 2017-12-05 DIAGNOSIS — I4891 Unspecified atrial fibrillation: Secondary | ICD-10-CM | POA: Diagnosis not present

## 2017-12-05 DIAGNOSIS — Y92239 Unspecified place in hospital as the place of occurrence of the external cause: Secondary | ICD-10-CM | POA: Diagnosis present

## 2017-12-05 DIAGNOSIS — M069 Rheumatoid arthritis, unspecified: Secondary | ICD-10-CM | POA: Diagnosis present

## 2017-12-05 DIAGNOSIS — A419 Sepsis, unspecified organism: Secondary | ICD-10-CM | POA: Diagnosis not present

## 2017-12-05 DIAGNOSIS — I2510 Atherosclerotic heart disease of native coronary artery without angina pectoris: Secondary | ICD-10-CM | POA: Diagnosis not present

## 2017-12-05 DIAGNOSIS — Z90710 Acquired absence of both cervix and uterus: Secondary | ICD-10-CM

## 2017-12-05 DIAGNOSIS — J440 Chronic obstructive pulmonary disease with acute lower respiratory infection: Secondary | ICD-10-CM | POA: Diagnosis not present

## 2017-12-05 DIAGNOSIS — E785 Hyperlipidemia, unspecified: Secondary | ICD-10-CM | POA: Diagnosis not present

## 2017-12-05 DIAGNOSIS — I10 Essential (primary) hypertension: Secondary | ICD-10-CM | POA: Diagnosis present

## 2017-12-05 DIAGNOSIS — I70299 Other atherosclerosis of native arteries of extremities, unspecified extremity: Secondary | ICD-10-CM

## 2017-12-05 DIAGNOSIS — F418 Other specified anxiety disorders: Secondary | ICD-10-CM | POA: Diagnosis not present

## 2017-12-05 DIAGNOSIS — E7800 Pure hypercholesterolemia, unspecified: Secondary | ICD-10-CM | POA: Diagnosis not present

## 2017-12-05 DIAGNOSIS — I7301 Raynaud's syndrome with gangrene: Secondary | ICD-10-CM | POA: Diagnosis present

## 2017-12-05 DIAGNOSIS — R2681 Unsteadiness on feet: Secondary | ICD-10-CM | POA: Diagnosis not present

## 2017-12-05 DIAGNOSIS — R4182 Altered mental status, unspecified: Secondary | ICD-10-CM | POA: Diagnosis not present

## 2017-12-05 DIAGNOSIS — Z881 Allergy status to other antibiotic agents status: Secondary | ICD-10-CM | POA: Diagnosis not present

## 2017-12-05 DIAGNOSIS — I25719 Atherosclerosis of autologous vein coronary artery bypass graft(s) with unspecified angina pectoris: Secondary | ICD-10-CM | POA: Diagnosis not present

## 2017-12-05 DIAGNOSIS — I739 Peripheral vascular disease, unspecified: Secondary | ICD-10-CM | POA: Diagnosis not present

## 2017-12-05 DIAGNOSIS — M79672 Pain in left foot: Secondary | ICD-10-CM | POA: Diagnosis present

## 2017-12-05 DIAGNOSIS — M6281 Muscle weakness (generalized): Secondary | ICD-10-CM | POA: Diagnosis not present

## 2017-12-05 DIAGNOSIS — Z888 Allergy status to other drugs, medicaments and biological substances status: Secondary | ICD-10-CM

## 2017-12-05 DIAGNOSIS — J449 Chronic obstructive pulmonary disease, unspecified: Secondary | ICD-10-CM | POA: Diagnosis not present

## 2017-12-05 DIAGNOSIS — I70222 Atherosclerosis of native arteries of extremities with rest pain, left leg: Secondary | ICD-10-CM | POA: Diagnosis not present

## 2017-12-05 HISTORY — PX: LOWER EXTREMITY ANGIOGRAPHY: CATH118251

## 2017-12-05 LAB — CREATININE, SERUM
CREATININE: 0.6 mg/dL (ref 0.44–1.00)
GFR calc Af Amer: >60 mL/min (ref >60)

## 2017-12-05 LAB — GLUCOSE, CAPILLARY: Glucose-Capillary: 139 mg/dL — ABNORMAL HIGH (ref 65–99)

## 2017-12-05 LAB — BUN: BUN: 24 mg/dL — ABNORMAL HIGH (ref 6–20)

## 2017-12-05 SURGERY — LOWER EXTREMITY ANGIOGRAPHY
Anesthesia: Moderate Sedation | Laterality: Left

## 2017-12-05 MED ORDER — ASPIRIN EC 81 MG PO TBEC
81.0000 mg | DELAYED_RELEASE_TABLET | Freq: Every day | ORAL | Status: DC
  Administered 2017-12-06 – 2017-12-19 (×12): 81 mg via ORAL
  Filled 2017-12-05 (×12): qty 1

## 2017-12-05 MED ORDER — IOPAMIDOL (ISOVUE-300) INJECTION 61%
INTRAVENOUS | Status: DC | PRN
  Administered 2017-12-05: 70 mL via INTRA_ARTERIAL

## 2017-12-05 MED ORDER — FAMOTIDINE 20 MG PO TABS: 40.0000 mg | ORAL_TABLET | ORAL | Status: DC | PRN

## 2017-12-05 MED ORDER — HEPARIN (PORCINE) IN NACL 1000-0.9 UT/500ML-% IV SOLN
INTRAVENOUS | Status: AC
  Filled 2017-12-05: qty 1000

## 2017-12-05 MED ORDER — FENTANYL CITRATE (PF) 100 MCG/2ML IJ SOLN
INTRAMUSCULAR | Status: AC
  Filled 2017-12-05: qty 2

## 2017-12-05 MED ORDER — HEPARIN SODIUM (PORCINE) 1000 UNIT/ML IJ SOLN
INTRAMUSCULAR | Status: DC | PRN
  Administered 2017-12-05: 5000 [IU] via INTRAVENOUS

## 2017-12-05 MED ORDER — SODIUM CHLORIDE 0.9% FLUSH: 3.0000 mL | INTRAVENOUS | Status: DC | PRN

## 2017-12-05 MED ORDER — DILTIAZEM HCL ER COATED BEADS 120 MG PO CP24
240.0000 mg | ORAL_CAPSULE | Freq: Every day | ORAL | Status: DC
  Administered 2017-12-06 – 2017-12-11 (×6): 240 mg via ORAL
  Filled 2017-12-05 (×3): qty 1
  Filled 2017-12-05: qty 2
  Filled 2017-12-05: qty 1
  Filled 2017-12-05: qty 2
  Filled 2017-12-05 (×2): qty 1
  Filled 2017-12-05 (×3): qty 2
  Filled 2017-12-05: qty 1
  Filled 2017-12-05 (×2): qty 2
  Filled 2017-12-05: qty 1
  Filled 2017-12-05: qty 2

## 2017-12-05 MED ORDER — SERTRALINE HCL 50 MG PO TABS
50.0000 mg | ORAL_TABLET | Freq: Every day | ORAL | Status: DC
  Administered 2017-12-05 – 2017-12-18 (×12): 50 mg via ORAL
  Filled 2017-12-05 (×13): qty 1

## 2017-12-05 MED ORDER — MIDAZOLAM HCL 2 MG/2ML IJ SOLN
INTRAMUSCULAR | Status: AC
  Filled 2017-12-05: qty 2

## 2017-12-05 MED ORDER — HYDROMORPHONE HCL 1 MG/ML IJ SOLN
INTRAMUSCULAR | Status: AC
  Filled 2017-12-05: qty 1

## 2017-12-05 MED ORDER — HEPARIN SODIUM (PORCINE) 1000 UNIT/ML IJ SOLN
INTRAMUSCULAR | Status: AC
Start: 2017-12-05 — End: ?
  Filled 2017-12-05: qty 1

## 2017-12-05 MED ORDER — LABETALOL HCL 5 MG/ML IV SOLN: 10.0000 mg | INTRAVENOUS | Status: DC | PRN

## 2017-12-05 MED ORDER — ACETAMINOPHEN 500 MG PO TABS: 1000.0000 mg | ORAL_TABLET | Freq: Three times a day (TID) | ORAL | Status: DC | PRN

## 2017-12-05 MED ORDER — METHYLPREDNISOLONE SODIUM SUCC 125 MG IJ SOLR: 125.0000 mg | INTRAMUSCULAR | Status: DC | PRN

## 2017-12-05 MED ORDER — ONDANSETRON HCL 4 MG/2ML IJ SOLN: 4.0000 mg | Freq: Four times a day (QID) | INTRAMUSCULAR | Status: DC | PRN

## 2017-12-05 MED ORDER — FENTANYL CITRATE (PF) 100 MCG/2ML IJ SOLN
INTRAMUSCULAR | Status: DC | PRN
  Administered 2017-12-05 (×2): 25 ug via INTRAVENOUS
  Administered 2017-12-05: 50 ug via INTRAVENOUS
  Administered 2017-12-05 (×2): 25 ug via INTRAVENOUS

## 2017-12-05 MED ORDER — LIDOCAINE HCL (PF) 1 % IJ SOLN
INTRAMUSCULAR | Status: AC
  Filled 2017-12-05: qty 30

## 2017-12-05 MED ORDER — HYDRALAZINE HCL 20 MG/ML IJ SOLN: 5.0000 mg | INTRAMUSCULAR | Status: DC | PRN

## 2017-12-05 MED ORDER — GABAPENTIN 300 MG PO CAPS
300.0000 mg | ORAL_CAPSULE | Freq: Every day | ORAL | Status: DC
  Administered 2017-12-06 – 2017-12-19 (×13): 300 mg via ORAL
  Filled 2017-12-05 (×13): qty 1

## 2017-12-05 MED ORDER — LORAZEPAM 1 MG PO TABS
1.0000 mg | ORAL_TABLET | Freq: Two times a day (BID) | ORAL | Status: DC | PRN
  Administered 2017-12-08: 1 mg via ORAL
  Filled 2017-12-05: qty 1

## 2017-12-05 MED ORDER — TIROFIBAN (AGGRASTAT) BOLUS VIA INFUSION
25.0000 ug/kg | Freq: Once | INTRAVENOUS | Status: AC
Start: 2017-12-05 — End: 2017-12-05
  Administered 2017-12-05: 12500 ug via INTRAVENOUS

## 2017-12-05 MED ORDER — ACETAMINOPHEN 325 MG PO TABS: 650.0000 mg | ORAL_TABLET | ORAL | Status: DC | PRN

## 2017-12-05 MED ORDER — PANTOPRAZOLE SODIUM 40 MG PO TBEC
40.0000 mg | DELAYED_RELEASE_TABLET | Freq: Every day | ORAL | Status: DC
Start: 2017-12-06 — End: 2017-12-19
  Administered 2017-12-06 – 2017-12-19 (×13): 40 mg via ORAL
  Filled 2017-12-05 (×13): qty 1

## 2017-12-05 MED ORDER — HYDROXYCHLOROQUINE SULFATE 200 MG PO TABS
200.0000 mg | ORAL_TABLET | Freq: Two times a day (BID) | ORAL | Status: DC
  Administered 2017-12-05 – 2017-12-19 (×25): 200 mg via ORAL
  Filled 2017-12-05 (×30): qty 1

## 2017-12-05 MED ORDER — LISINOPRIL 10 MG PO TABS
10.0000 mg | ORAL_TABLET | Freq: Every day | ORAL | Status: DC
  Administered 2017-12-06 – 2017-12-11 (×6): 10 mg via ORAL
  Filled 2017-12-05 (×8): qty 1

## 2017-12-05 MED ORDER — HYDROMORPHONE HCL 1 MG/ML IJ SOLN
INTRAMUSCULAR | Status: AC
  Administered 2017-12-05: 0.5 mg via INTRAVENOUS
  Filled 2017-12-05: qty 0.5

## 2017-12-05 MED ORDER — SODIUM CHLORIDE 0.9 % IV SOLN
INTRAVENOUS | Status: AC
  Administered 2017-12-05: 20:00:00 via INTRAVENOUS

## 2017-12-05 MED ORDER — PRAVASTATIN SODIUM 40 MG PO TABS
80.0000 mg | ORAL_TABLET | Freq: Every day | ORAL | Status: DC
  Administered 2017-12-05 – 2017-12-18 (×12): 80 mg via ORAL
  Filled 2017-12-05: qty 4
  Filled 2017-12-05 (×3): qty 2
  Filled 2017-12-05: qty 4
  Filled 2017-12-05: qty 2
  Filled 2017-12-05 (×2): qty 4
  Filled 2017-12-05: qty 2
  Filled 2017-12-05: qty 4
  Filled 2017-12-05: qty 2
  Filled 2017-12-05: qty 4
  Filled 2017-12-05 (×3): qty 2
  Filled 2017-12-05 (×2): qty 4
  Filled 2017-12-05: qty 2
  Filled 2017-12-05: qty 4
  Filled 2017-12-05 (×2): qty 2

## 2017-12-05 MED ORDER — TIROFIBAN HCL IN NACL 5-0.9 MG/100ML-% IV SOLN
0.0750 ug/kg/min | INTRAVENOUS | Status: DC
  Filled 2017-12-05 (×2): qty 100

## 2017-12-05 MED ORDER — MORPHINE SULFATE (PF) 4 MG/ML IV SOLN
2.0000 mg | INTRAVENOUS | Status: DC | PRN
  Administered 2017-12-05 – 2017-12-08 (×9): 2 mg via INTRAVENOUS
  Filled 2017-12-05 (×10): qty 1

## 2017-12-05 MED ORDER — OXYCODONE HCL 5 MG PO TABS
5.0000 mg | ORAL_TABLET | ORAL | Status: DC | PRN
  Administered 2017-12-06 – 2017-12-13 (×25): 10 mg via ORAL
  Filled 2017-12-05 (×10): qty 2
  Filled 2017-12-05: qty 1
  Filled 2017-12-05 (×16): qty 2

## 2017-12-05 MED ORDER — SODIUM CHLORIDE 0.9 % IV SOLN: 250.0000 mL | INTRAVENOUS | Status: DC | PRN

## 2017-12-05 MED ORDER — MIDAZOLAM HCL 5 MG/5ML IJ SOLN
INTRAMUSCULAR | Status: AC
  Filled 2017-12-05: qty 5

## 2017-12-05 MED ORDER — SODIUM CHLORIDE 0.9% FLUSH
3.0000 mL | Freq: Two times a day (BID) | INTRAVENOUS | Status: DC
  Administered 2017-12-05 – 2017-12-18 (×20): 3 mL via INTRAVENOUS

## 2017-12-05 MED ORDER — FERROUS SULFATE 325 (65 FE) MG PO TABS
325.0000 mg | ORAL_TABLET | Freq: Every day | ORAL | Status: DC
  Administered 2017-12-06 – 2017-12-19 (×13): 325 mg via ORAL
  Filled 2017-12-05 (×13): qty 1

## 2017-12-05 MED ORDER — HYDROMORPHONE HCL 1 MG/ML IJ SOLN
1.0000 mg | Freq: Once | INTRAMUSCULAR | Status: AC | PRN
  Administered 2017-12-05 (×2): 0.5 mg via INTRAVENOUS

## 2017-12-05 MED ORDER — PREDNISONE 10 MG PO TABS
5.0000 mg | ORAL_TABLET | Freq: Every day | ORAL | Status: DC
  Administered 2017-12-06 – 2017-12-19 (×13): 5 mg via ORAL
  Filled 2017-12-05 (×3): qty 1
  Filled 2017-12-05 (×4): qty 2
  Filled 2017-12-05 (×2): qty 1
  Filled 2017-12-05 (×4): qty 2
  Filled 2017-12-05: qty 1
  Filled 2017-12-05: qty 2

## 2017-12-05 MED ORDER — ONDANSETRON HCL 4 MG/2ML IJ SOLN
4.0000 mg | Freq: Four times a day (QID) | INTRAMUSCULAR | Status: DC | PRN
  Administered 2017-12-10: 4 mg via INTRAVENOUS
  Filled 2017-12-05: qty 2

## 2017-12-05 MED ORDER — ADULT MULTIVITAMIN W/MINERALS CH
1.0000 | ORAL_TABLET | Freq: Every day | ORAL | Status: DC
  Administered 2017-12-06 – 2017-12-19 (×13): 1 via ORAL
  Filled 2017-12-05 (×13): qty 1

## 2017-12-05 MED ORDER — TIZANIDINE HCL 4 MG PO TABS
4.0000 mg | ORAL_TABLET | Freq: Every day | ORAL | Status: DC | PRN
  Administered 2017-12-09 – 2017-12-16 (×3): 4 mg via ORAL
  Filled 2017-12-05 (×4): qty 1

## 2017-12-05 MED ORDER — MIDAZOLAM HCL 2 MG/2ML IJ SOLN
INTRAMUSCULAR | Status: DC | PRN
  Administered 2017-12-05 (×4): 1 mg via INTRAVENOUS
  Administered 2017-12-05: 2 mg via INTRAVENOUS

## 2017-12-05 MED ORDER — SODIUM CHLORIDE 0.9 % IV SOLN
INTRAVENOUS | Status: DC
  Administered 2017-12-05: 14:00:00 via INTRAVENOUS

## 2017-12-05 MED ORDER — TIROFIBAN HCL IV 12.5 MG/250 ML
INTRAVENOUS | Status: AC
  Filled 2017-12-05: qty 250

## 2017-12-05 MED ORDER — CLOPIDOGREL BISULFATE 75 MG PO TABS
75.0000 mg | ORAL_TABLET | Freq: Every day | ORAL | Status: DC
  Administered 2017-12-06 – 2017-12-19 (×12): 75 mg via ORAL
  Filled 2017-12-05 (×12): qty 1

## 2017-12-05 MED ORDER — GEMFIBROZIL 600 MG PO TABS
600.0000 mg | ORAL_TABLET | Freq: Two times a day (BID) | ORAL | Status: DC
  Administered 2017-12-06 – 2017-12-19 (×25): 600 mg via ORAL
  Filled 2017-12-05 (×31): qty 1

## 2017-12-05 MED ORDER — LEFLUNOMIDE 20 MG PO TABS
20.0000 mg | ORAL_TABLET | Freq: Every day | ORAL | Status: DC
  Administered 2017-12-06 – 2017-12-19 (×13): 20 mg via ORAL
  Filled 2017-12-05 (×14): qty 1

## 2017-12-05 SURGICAL SUPPLY — 28 items
BALLN COYOTE OTW 1.5X40X150 (BALLOONS) ×3
BALLN COYOTE OTW 2X100X150 (BALLOONS) ×3
BALLN MINITREK RX 1.5X15 (BALLOONS) ×3
BALLOON COYOTE OTW 1.5X40X150 (BALLOONS) ×1 IMPLANT
BALLOON COYOTE OTW 2X100X150 (BALLOONS) ×1 IMPLANT
BALLOON MINITREK RX 1.5X15 (BALLOONS) ×1 IMPLANT
CATH BEACON 5 .035 65 KMP TIP (CATHETERS) ×3 IMPLANT
CATH CXI SUPP ST 2.6FR 150CM (CATHETERS) ×3 IMPLANT
CATH CXI SUPP ST 4FR 90CM (CATHETERS) ×3 IMPLANT
DEVICE CLOSURE MYNXGRIP 6/7F (Vascular Products) ×3 IMPLANT
DEVICE PRESTO INFLATION (MISCELLANEOUS) ×3 IMPLANT
DEVICE STARCLOSE SE CLOSURE (Vascular Products) IMPLANT
DRAPE BRACHIAL (DRAPES) ×6 IMPLANT
DRAPE INCISE IOBAN 66X45 STRL (DRAPES) ×3 IMPLANT
GLIDEWIRE ADV .014X300CM (WIRE) ×3 IMPLANT
GLIDEWIRE ADV .035X180CM (WIRE) ×3 IMPLANT
GUIDEWIRE ADV .018X180CM (WIRE) ×3 IMPLANT
NEEDLE ENTRY 21GA 7CM ECHOTIP (NEEDLE) ×3 IMPLANT
PACK ANGIOGRAPHY (CUSTOM PROCEDURE TRAY) ×3 IMPLANT
SET INTRO CAPELLA COAXIAL (SET/KITS/TRAYS/PACK) ×3 IMPLANT
SHEATH BRITE TIP 5FRX11 (SHEATH) ×3 IMPLANT
SHEATH BRITE TIP 6FR X 23 (SHEATH) ×3 IMPLANT
SHEATH BRITE TIP 6FRX11 (SHEATH) ×3 IMPLANT
SHEATH MICROPUNCTURE PEDAL 4FR (SHEATH) ×3 IMPLANT
TOWEL OR 17X26 4PK STRL BLUE (TOWEL DISPOSABLE) ×3 IMPLANT
VALVE HEMO TOUHY BORST Y (ADAPTER) ×3 IMPLANT
WIRE G 018X200 V18 (WIRE) ×3 IMPLANT
WIRE G V18X300CM (WIRE) ×3 IMPLANT

## 2017-12-05 NOTE — Progress Notes (Signed)
Patient remains clinically stable post procedure with vitals stable. Patient alert and oriented with daughter at bedside. Dr Waldron Labs out to speak with patient and daughter with questions answered. Denies complaints at this time. aggrastat bolus iv done with maintenance dose overnight per orders. PAD device with 40 ml air instilled on left groin at this time. No bleeding nor hematoma at site. Report called to [place] on 2c with plan reviewed as well as orders.

## 2017-12-05 NOTE — Progress Notes (Signed)
   12/05/17 2050  Clinical Encounter Type  Visited With Patient and family together  Visit Type Initial  Referral From Nurse  Consult/Referral To Chaplain  Spiritual Encounters  Spiritual Needs Brochure   [place] received an AD to educate PT on AD. CH reported to PT's RM and left paper work with PT and her daughter. Will follow up as needed.

## 2017-12-05 NOTE — H&P (Signed)
[facility] History & Physical Update  The patient was interviewed and re-examined.  The patient's previous History and Physical has been reviewed and is unchanged.  There is no change in the plan of care. We plan to proceed with the scheduled procedure.  [name], MD  12/05/2017, 3:26 PM

## 2017-12-05 NOTE — Op Note (Signed)
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 Introduction catheter into left lower extremity 3rd order catheter placement with additional third order catheter placement 2. Contrast injection left lower extremity for distal runoff with additional 3rd order  3. Percutaneous transluminal angioplasty left distal anterior tibial and dorsalis pedis arteries              4. Minx closure left common femoral arteriotomy  Anesthesia: Conscious sedation was administered under my direct supervision by the interventional radiology RN. IV Versed plus fentanyl were utilized. Continuous ECG, pulse oximetry and blood pressure was monitored throughout the entire procedure.  Conscious sedation was for a total of 2 hour 17 minutes.  Sheath: 6 French 23 cm Pinnacle sheath antegrade approach left common femoral  Contrast: 70 cc  Fluoroscopy Time: 21.1 minutes  Indications: Melissa [name] presents with increasing pain of the left lower extremity.  She has multiple ulcerations of the forefoot.  Last week attempts at recanalizing the posterior tibial were not successful.  She return to the office sooner than expected with intense pain and she is therefore undergoing angiography as a second attempt at limb salvage.  This suggests the patient is having limb threatening ischemia and is at high risk for limb loss. The risks and benefits are reviewed all questions answered patient agrees to proceed.  Procedure:Melissa [name] is a 77 y.o. y.o. female who was identified and appropriate procedural time out was performed. The patient was then placed supine on the  table and prepped and draped in the usual sterile fashion.   Ultrasound was placed in the sterile sleeve and the left groin was evaluated the left common femoral artery was echolucent and pulsatile indicating patency. Image was recorded for the permanent record and under real-time visualization a microneedle was inserted into the common femoral artery followed by the microwire and then the micro-sheath. A J-wire was then advanced through the micro-sheath and a 5 [place] sheath was then inserted over a J-wire.  5000 units of heparin was then given and allowed to circulate.  Hand-injection of contrast was performed through the sheath which confirmed occlusion of the distal two thirds of the posterior tibial as well as the distal two thirds of the anterior tibial.  Advantage wire and a 65 cm [place] was then negotiated down to the distal popliteal where magnified image demonstrated the trifurcation.  Advantage wire and [place] were then negotiated into the posterior tibial.  This represents the initial third order catheter placement.  Using multiple wires including the advantage wire and 014 advantage wire and a V 18 Wire Crossing into the distal posterior tibial artery was not successful.  Attempts at accessing the posterior tibial artery from the pedal approach with ultrasound guidance was not successful.  Attempts at accessing the dorsalis pedis artery with ultrasound guidance was not successful.  Catheter and wire combination was then pulled back into the distal popliteal.  CXI straight catheter and advantage wire were then negotiated down into the anterior tibial.  This represents the additional third order catheter placement.  Catheter was then advanced. Hand injection contrast demonstrated the anterior tibial anatomy in detail.  Again using a combination of different wires catheters and eventually a 1.5 x 15 mm coronary balloon the wire was negotiated out  into the distal tarsal artery of the great toe.   The 1.5 mm x 15 mm [place] coronary balloon was used to angioplasty the entire length of the forefoot up to the ankle approximately 7 inflations were required.  The inflation was for 30 seconds at 14 atm.  Subsequently a 2 mm x 15 cm Ultraverse balloon was advanced down so that the distal marker was just past the ankle joint and a single inflation was performed to 14 atm for 2 minutes.  Follow-up imaging demonstrated excellent patency with less than 20% residual stenosis and preservation of the distal runoff.  After review of these images the sheath is pulled into the left common femoral and an LAO view is obtained and a minx device deployed. There no immediate Complications.  Findings:  The left common femoral is widely patent as is the profunda femoris.  The SFA and popliteal are widely patent.  The trifurcation is diseased with occlusion of the anterior tibial and posterior tibial arteries from their mid portions distally.   The peroneal is patent and there is filling down to the ankle but there is very poor collateralizing from the distal peroneal to the dorsalis pedis and the lateral plantars.  Pedal vessels are almost nonexistent and there is no evidence of the pedal arch.  Following angioplasty the anterior tibial now is now patent and demonstrates in-line flow to the pedal arch and fills the tarsal artery of the great toe.  It is much improved and looks nice.   Summary: Successful recanalization left lower extremity for limb salvage   Disposition: Patient was taken to the recovery room in stable condition having tolerated the procedure well.  Melissa [name] 12/05/2017,6:06 PM

## 2017-12-06 ENCOUNTER — Encounter: Payer: Self-pay | Admitting: Vascular Surgery

## 2017-12-06 DIAGNOSIS — I70222 Atherosclerosis of native arteries of extremities with rest pain, left leg: Secondary | ICD-10-CM | POA: Diagnosis not present

## 2017-12-06 LAB — CBC
HCT: 26.9 % — ABNORMAL LOW (ref 35.0–47.0)
Hemoglobin: 9 g/dL — ABNORMAL LOW (ref 12.0–16.0)
MCH: 31.7 pg (ref 26.0–34.0)
MCHC: 33.6 g/dL (ref 32.0–36.0)
MCV: 94.6 fL (ref 80.0–100.0)
PLATELETS: 206 10*3/uL (ref 150–440)
RBC: 2.84 MIL/uL — AB (ref 3.80–5.20)
RDW: 14.9 % — ABNORMAL HIGH (ref 11.5–14.5)
WBC: 12.7 10*3/uL — AB (ref 3.6–11.0)

## 2017-12-06 MED ORDER — APIXABAN 5 MG PO TABS
5.0000 mg | ORAL_TABLET | Freq: Two times a day (BID) | ORAL | Status: DC
  Administered 2017-12-06 – 2017-12-07 (×2): 5 mg via ORAL
  Filled 2017-12-06 (×3): qty 1

## 2017-12-06 MED ORDER — TIROFIBAN HCL IN NACL 5-0.9 MG/100ML-% IV SOLN
0.0750 ug/kg/min | INTRAVENOUS | Status: AC
  Administered 2017-12-06: 0.075 ug/kg/min via INTRAVENOUS
  Filled 2017-12-06: qty 100

## 2017-12-06 MED ORDER — BACITRACIN ZINC 500 UNIT/GM EX OINT
TOPICAL_OINTMENT | Freq: Every day | CUTANEOUS | Status: DC
  Administered 2017-12-06: 1 via TOPICAL
  Administered 2017-12-07: 17:00:00 via TOPICAL
  Administered 2017-12-08 – 2017-12-11 (×2): 1 via TOPICAL
  Filled 2017-12-06 (×4): qty 0.9

## 2017-12-06 NOTE — Care Management (Signed)
Patient status post:  Procedure(s) Performed: 1. Introduction catheter into left lower extremity 3rd order catheter placement with additional third order catheter placement 2. Contrast injection left lower extremity for distal runoff with additional 3rd order  3. Percutaneous transluminal angioplasty left distal anterior tibial and dorsalis pedis arteries              4. Minx closure left common femoral arteriotomy   Patient lives at home with husband. Daughter lives locally for support. PCP [facility].  PT has assessed patient and recommends home health PT. Patient agreeable to home health services, and does not have preference of home health agency.  Heads up referral made to [facility] with [place] care.

## 2017-12-06 NOTE — Progress Notes (Signed)
Per Dr. Ronalee Belts okay to place order for bacitracin to be used on toe wounds.

## 2017-12-06 NOTE — Evaluation (Signed)
Physical Therapy Evaluation Patient Details Name: Melissa [name] MRN: [id number] DOB: Aug 05, 1940 Today's Date: 12/06/2017   History of Present Illness  Pt is a 77 y/o F s/p LLE angioplasty L distal anterior tibial and dorsalis pedis arteries.  Pt's PMH includes COPD, back pain.     Clinical Impression  Pt admitted with above diagnosis. Pt currently with functional limitations due to the deficits listed below (see PT Problem List). Ms. Guinther reported 8/10 pain in [place] on LLE but was able to progressively increase amount of WB on LLE as she ambulated a total of 40 ft with RW.  Min guard assist provided for safety with transfers and ambulation this session.  Pt lives with husband who cannot provide physical assist but will be able to assist with providing meals and bringing pt anything she needs.  Two children live very close and will be able to do the grocery shopping and running errands.  Emphasized importance of safety with mobility and discussed implications of this with home setup at d/c.  Pt will benefit from skilled PT to increase their independence and safety with mobility to allow discharge to the venue listed below.      Follow Up Recommendations Home health PT    Equipment Recommendations  None recommended by PT    Recommendations for Other Services       Precautions / Restrictions Precautions Precautions: Fall Restrictions Weight Bearing Restrictions: Yes LLE Weight Bearing: Weight bearing as tolerated      Mobility  Bed Mobility Overal bed mobility: Needs Assistance Bed Mobility: Supine to Sit     Supine to sit: Modified independent (Device/Increase time);HOB elevated     General bed mobility comments: No physical assist or cues needed.  Pt performs with increased time.   Transfers Overall transfer level: Needs assistance Equipment used: Rolling walker (2 wheeled) Transfers: Sit to/from Stand Sit to Stand: Min guard         General transfer comment: Cues for  hand placement.  Pt steady with standing and well controlled descent to sit.  Pt places majority of weight through RLE with sit<>stand.   Ambulation/Gait Ambulation/Gait assistance: Min guard Ambulation Distance (Feet): 40 Feet Assistive device: Rolling walker (2 wheeled) Gait Pattern/deviations: Step-to pattern;Decreased stance time - left;Decreased weight shift to left;Antalgic Gait velocity: decreased Gait velocity interpretation: <1.8 ft/sec, indicate of risk for recurrent falls General Gait Details: Pt initially not placing much weight through LLE and weight placed mainly through heel.  However, pt is gradually able to increase amount of weight bearing through LLE.  Min guard for safety.    Stairs            Wheelchair Mobility    Modified Rankin (Stroke Patients Only)       Balance Overall balance assessment: Needs assistance Sitting-balance support: No upper extremity supported;Feet supported Sitting balance-Leahy Scale: Good     Standing balance support: Single extremity supported;During functional activity Standing balance-Leahy Scale: Poor Standing balance comment: Relies on at least 1UE support for static and dynamic activities                             Pertinent Vitals/Pain Pain Assessment: 0-10 Pain Score: 8  Pain Location: disal to middle of L calf Pain Descriptors / Indicators: Grimacing;Guarding Pain Intervention(s): Limited activity within patient's tolerance;Monitored during session;Repositioned    Home Living Family/patient expects to be discharged to:: Private residence Living Arrangements: Spouse/significant other Available Help at  Discharge: Family;Available 24 hours/day Type of Home: House Home Access: Stairs to enter Entrance Stairs-Rails: None Entrance Stairs-Number of Steps: 1 Home Layout: One level(1 step into kitchen) Home Equipment: Cane - single point;Bedside commode;Shower seat;Walker - 2 wheels;Grab bars -  tub/shower Additional Comments: Pt lives with husband who cannot provide physical assist but will be able to assist with providing meals and bringing pt anything she needs.  Two children live very close and will be able to do the grocery shopping and running errands.     Prior Function Level of Independence: Independent with assistive device(s)         Comments: Pt was ambulating with RW and essentially hopping on LLE due to pain.  Pt reports only 1 fall in the past 6 months which happened earlier in the week, her daughter was able to catch her to prevent her from falling to the floor.      Hand Dominance        Extremity/Trunk Assessment   Upper Extremity Assessment Upper Extremity Assessment: Overall WFL for tasks assessed    Lower Extremity Assessment Lower Extremity Assessment: LLE deficits/detail LLE Deficits / Details: limited ROM and strength due to pain.   LLE: Unable to fully assess due to pain    Cervical / Trunk Assessment Cervical / Trunk Assessment: Other exceptions Cervical / Trunk Exceptions: h/o back pain  Communication   Communication: No difficulties  Cognition Arousal/Alertness: Awake/alert Behavior During Therapy: WFL for tasks assessed/performed Overall Cognitive Status: Within Functional Limits for tasks assessed                                        General Comments General comments (skin integrity, edema, etc.): Daughter present during evaluation    Exercises General Exercises - Lower Extremity Ankle Circles/Pumps: AROM;Both;10 reps;Supine Long Arc Quad: Left;10 reps;Seated;AROM   Assessment/Plan    PT Assessment Patient needs continued PT services  PT Problem List Decreased strength;Decreased range of motion;Decreased balance;Pain;Decreased safety awareness       PT Treatment Interventions DME instruction;Gait training;Stair training;Functional mobility training;Therapeutic activities;Therapeutic exercise;Balance  training;Neuromuscular re-education;Patient/family education;Modalities;Wheelchair mobility training    PT Goals (Current goals can be found in the Care Plan section)  Acute Rehab PT Goals Patient Stated Goal: to return home and improve independence PT Goal Formulation: With patient Time For Goal Achievement: 12/20/17 Potential to Achieve Goals: Good    Frequency Min 2X/week   Barriers to discharge        Co-evaluation               AM-PAC PT "6 Clicks" Daily Activity  Outcome Measure Difficulty turning over in bed (including adjusting bedclothes, sheets and blankets)?: A Little Difficulty moving from lying on back to sitting on the side of the bed? : A Little Difficulty sitting down on and standing up from a chair with arms (e.g., wheelchair, bedside commode, etc,.)?: A Little Help needed moving to and from a bed to chair (including a wheelchair)?: A Little Help needed walking in hospital room?: A Little Help needed climbing 3-5 steps with a railing? : A Little 6 Click Score: 18    End of Session Equipment Utilized During Treatment: Gait belt Activity Tolerance: Patient tolerated treatment well;Patient limited by pain Patient left: in chair;with call bell/phone within reach;with chair alarm set;with family/visitor present Nurse Communication: Mobility status PT Visit Diagnosis: Pain;Other abnormalities of gait and mobility (R26.89);Unsteadiness  on feet (R26.81) Pain - Right/Left: Left Pain - part of body: Leg    Time: 8883-5844 PT Time Calculation (min) (ACUTE ONLY): 30 min   Charges:   PT Evaluation $PT Eval Low Complexity: 1 Low PT Treatments $Gait Training: 8-22 mins   PT G Codes:        Collie Siad PT, DPT 12/06/2017, 10:48 AM

## 2017-12-06 NOTE — Progress Notes (Signed)
Per Dr. Delana Meyer okay to place order for PT eval. 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

## 2017-12-06 NOTE — Care Management Obs Status (Signed)
[facility] NOTIFICATION   Patient Details  Name: Melissa [name] MRN: [id number] Date of Birth: [birth year]   Medicare Observation Status Notification Given:  Yes    Beverly Sessions, RN 12/06/2017, 5:28 PM

## 2017-12-06 NOTE — Progress Notes (Signed)
[facility]  Daily Progress Note   Subjective  - 1 Day Post-Op  The pain was intense yesterday after the procedure but has eased up tremendously at this point  Objective Vitals:   12/05/17 2037 12/06/17 0443 12/06/17 1037 12/06/17 1333  BP: (!) 127/57 (!) 135/58 122/63 127/66  Pulse: 79 84 72 70  Resp: 16 17  17   Temp: 99.4 F (37.4 C) 99.3 F (37.4 C)  98.8 F (37.1 C)  TempSrc: Oral Oral  Oral  SpO2: 92% 96%  96%  Weight:      Height:        Intake/Output Summary (Last 24 hours) at 12/06/2017 1804 Last data filed at 12/06/2017 1737 Gross per 24 hour  Intake 1036 ml  Output 700 ml  Net 336 ml    PULM  Normal effort , no use of accessory muscles CV  No JVD, RRR Abd      No distended, nontender VASC  Heel and ankle on the left is warmer than preop but the toes remain cool to the touch  Laboratory CBC    Component Value Date/Time   WBC 12.7 (H) 12/06/2017 1447   HGB 9.0 (L) 12/06/2017 1447   HGB 11.6 (L) 06/16/2013 1031   HCT 26.9 (L) 12/06/2017 1447   HCT 34.8 (L) 06/16/2013 1031   PLT 206 12/06/2017 1447   PLT 226 06/16/2013 1031    BMET    Component Value Date/Time   NA 142 04/24/2017 0440   NA 139 06/16/2013 1031   K 4.1 04/24/2017 0440   K 3.8 06/16/2013 1031   CL 103 04/24/2017 0440   CL 105 06/16/2013 1031   CO2 28 04/24/2017 0440   CO2 30 06/16/2013 1031   GLUCOSE 144 (H) 04/24/2017 0440   GLUCOSE 96 06/16/2013 1031   BUN 24 (H) 12/05/2017 1355   BUN 23 (H) 06/16/2013 1031   CREATININE 0.60 12/05/2017 1355   CREATININE 0.58 (L) 06/16/2013 1031   CALCIUM 9.7 04/24/2017 0440   CALCIUM 9.7 06/16/2013 1031   GFRNONAA >60 12/05/2017 1355   GFRNONAA >60 06/16/2013 1031   GFRAA >60 12/05/2017 1355   GFRAA >60 06/16/2013 1031    Assessment/Planning: POD #1 s/p left leg revascularization  Continue Aggrastat for a full 24 hours then begin Eliquis and continue ASA and plavix for now  Will plan DC tomorrow because of  continuing parenteral anticoagulation  Melissa [name]  12/06/2017, 6:04 PM

## 2017-12-07 DIAGNOSIS — I70222 Atherosclerosis of native arteries of extremities with rest pain, left leg: Secondary | ICD-10-CM | POA: Diagnosis not present

## 2017-12-07 LAB — PROTIME-INR
INR: 1.17
Prothrombin Time: 14.8 seconds (ref 11.4–15.2)

## 2017-12-07 LAB — APTT: APTT: 35 s (ref 24–36)

## 2017-12-07 MED ORDER — HEPARIN (PORCINE) IN NACL 100-0.45 UNIT/ML-% IJ SOLN
1000.0000 [IU]/h | INTRAMUSCULAR | Status: DC
  Administered 2017-12-07 – 2017-12-09 (×2): 650 [IU]/h via INTRAVENOUS
  Administered 2017-12-10 (×2): 1000 [IU]/h via INTRAVENOUS
  Filled 2017-12-07 (×4): qty 250

## 2017-12-07 NOTE — Progress Notes (Signed)
ANTICOAGULATION CONSULT NOTE - Initial Consult  Pharmacy Consult for heparin gtt Indication: ischemia of leg   Allergies  Allergen Reactions  . Lodine [Etodolac] Hives and Itching  . Methotrexate Derivatives Other (See Comments)    Flu like symptoms  . Nitrofuran Derivatives Nausea And Vomiting and Other (See Comments)    Made sicker   . Norvasc [Amlodipine] Other (See Comments)    Unknown   . Pentasa [Mesalamine Er] Other (See Comments)    Unknown   . Red Yeast Rice Extract Other (See Comments)    Patient preference   . Vioxx [Rofecoxib] Other (See Comments)    Unknown   . Levaquin [Levofloxacin] Itching, Rash and Other (See Comments)    Muscle pain  . Sulfa Antibiotics Itching and Rash    Patient Measurements: Height: 5' 4"  (162.6 cm) Weight: 122 lb (55.3 kg) IBW/kg (Calculated) : 54.7 Heparin Dosing Weight: 55.3kg  Vital Signs: Temp: 98.5 F (36.9 C) (05/24 1340) Temp Source: Oral (05/24 1340) BP: 117/59 (05/24 1340) Pulse Rate: 70 (05/24 1340)  Labs: Recent Labs    12/05/17 1355 12/06/17 1447  HGB  --  9.0*  HCT  --  26.9*  PLT  --  206  CREATININE 0.60  --     Estimated Creatinine Clearance: 51.7 mL/min (by C-G formula based on SCr of 0.6 mg/dL).   Medical History: Past Medical History:  Diagnosis Date  . Anemia   . Anxiety   . Arthritis    rheumatoid  . Ataxia   . COPD (chronic obstructive pulmonary disease) ([place])   . Depression   . Diverticulosis   . Excessive falling   . GERD (gastroesophageal reflux disease)   . Hyperlipidemia   . Hypertension   . IBS (irritable bowel syndrome)   . Osteoarthritis   . Psoriasis   . Raynaud's disease without gangrene   . Raynaud's disease without gangrene   . Shortness of breath dyspnea    with exertion    Medications:  Medications Prior to Admission  Medication Sig Dispense Refill Last Dose  . acetaminophen (TYLENOL) 500 MG tablet Take 1,000 mg by mouth every 8 (eight) hours as needed for mild  pain or headache.   Past Month at Unknown time  . aspirin EC 81 MG tablet Take 1 tablet (81 mg total) by mouth daily.   12/04/2017 at Unknown time  . budesonide (ENTOCORT EC) 3 MG 24 hr capsule Take 3-6 mg by mouth See admin instructions. Pt takes 3 mg one day, 6 mg the next alternating doses   12/05/2017 at Unknown time  . Calcium Carbonate-Vitamin D (CALCIUM 600+D) 600-400 MG-UNIT tablet Take 1 tablet by mouth 2 (two) times daily.   12/04/2017 at Unknown time  . clopidogrel (PLAVIX) 75 MG tablet Take 1 tablet (75 mg total) by mouth daily. 30 tablet 4 12/04/2017 at Unknown time  . Cranberry 500 MG CAPS Take 500 mg by mouth daily.   12/05/2017 at Unknown time  . ferrous sulfate 325 (65 FE) MG tablet Take 325 mg by mouth daily with breakfast.   12/04/2017 at Unknown time  . gemfibrozil (LOPID) 600 MG tablet Take 600 mg by mouth 2 (two) times daily before a meal.   12/05/2017 at Unknown time  . hydroxychloroquine (PLAQUENIL) 200 MG tablet Take 200 mg by mouth 2 (two) times daily.    12/05/2017 at Unknown time  . leflunomide (ARAVA) 20 MG tablet Take 20 mg by mouth daily.   12/05/2017 at Unknown time  .  lisinopril (PRINIVIL,ZESTRIL) 10 MG tablet Take 10 mg by mouth daily.   12/05/2017 at Unknown time  . LORazepam (ATIVAN) 1 MG tablet Take 1 tablet (1 mg total) by mouth 2 (two) times daily as needed for anxiety. 10 tablet 0 Past Week at Unknown time  . Multiple Vitamin (MULTIVITAMIN) tablet Take 1 tablet by mouth daily.   12/04/2017 at Unknown time  . Multiple Vitamins-Minerals (PRESERVISION AREDS) CAPS Take 1 capsule by mouth 2 (two) times daily.   12/04/2017 at Unknown time  . omeprazole (PRILOSEC) 20 MG capsule Take 20 mg by mouth 2 (two) times daily before a meal.    12/05/2017 at Unknown time  . oxyCODONE-acetaminophen (PERCOCET/ROXICET) 5-325 MG tablet One To Two Tabs By Mouth Every Six Hours As Needed For Pain 21 tablet 0 12/05/2017 at Unknown time  . pravastatin (PRAVACHOL) 80 MG tablet Take 80 mg by mouth at  bedtime.    12/04/2017 at Unknown time  . predniSONE (DELTASONE) 5 MG tablet Take 5 mg by mouth daily with breakfast.   12/05/2017 at Unknown time  . sertraline (ZOLOFT) 50 MG tablet Take 50 mg by mouth at bedtime.    12/04/2017 at Unknown time  . apixaban (ELIQUIS) 5 MG TABS tablet Take 1 tablet (5 mg total) by mouth 2 (two) times daily. 60 tablet  12/01/2017  . cephALEXin (KEFLEX) 500 MG capsule Take 500 mg by mouth 3 (three) times daily. For 10 days - starting 11/21/2017   Not Taking at Unknown time  . diltiazem (CARDIZEM CD) 240 MG 24 hr capsule Take 240 mg by mouth daily.   Not Taking at Unknown time  . estradiol (ESTRACE) 0.1 MG/GM vaginal cream Place 1 g vaginally 3 (three) times a week. (Patient not taking: Reported on 11/27/2017) 42.5 g 12 Not Taking at Unknown time  . gabapentin (NEURONTIN) 300 MG capsule Take 1 capsule (300 mg total) by mouth daily. 30 capsule 0   . tiZANidine (ZANAFLEX) 4 MG tablet Take 4 mg by mouth daily as needed for muscle spasms (neck pain).    Not Taking at Unknown time   Scheduled:  . aspirin EC  81 mg Oral Daily  . bacitracin   Topical Daily  . clopidogrel  75 mg Oral Daily  . diltiazem  240 mg Oral Daily  . ferrous sulfate  325 mg Oral Q breakfast  . gabapentin  300 mg Oral Daily  . gemfibrozil  600 mg Oral BID AC  . hydroxychloroquine  200 mg Oral BID  . leflunomide  20 mg Oral Daily  . lisinopril  10 mg Oral Daily  . multivitamin with minerals  1 tablet Oral Daily  . pantoprazole  40 mg Oral Daily  . pravastatin  80 mg Oral QHS  . predniSONE  5 mg Oral Q breakfast  . sertraline  50 mg Oral QHS  . sodium chloride flush  3 mL Intravenous Q12H   Infusions:  . sodium chloride    . heparin     PRN: sodium chloride, acetaminophen, acetaminophen, hydrALAZINE, labetalol, LORazepam, morphine injection, ondansetron (ZOFRAN) IV, oxyCODONE, sodium chloride flush, tiZANidine Anti-infectives (From admission, onward)   Start     Dose/Rate Route Frequency Ordered  Stop   12/05/17 2200  hydroxychloroquine (PLAQUENIL) tablet 200 mg     200 mg Oral 2 times daily 12/05/17 1805     12/04/17 2245  ceFAZolin (ANCEF) IVPB 2g/100 mL premix     2 g 200 mL/hr over 30 Minutes Intravenous  Once 12/04/17  2241 12/05/17 1602      Assessment: 77 year old female was on eliquis 38m po bid, now Dr SDelana Meyerand KHerbert Moorswould like heparin gtt despite Hgb 9.0. Discontinuing apixaban and starting heparin gtt at the direction of KLinden we discussed hgb 9.0 via phone. Would like to proceed no bolus for heparin.    Goal of Therapy:  Heparin level 0.3-0.7 units/ml aPTT 66-102 seconds Monitor platelets by anticoagulation protocol: Yes   Plan:  Start heparin infusion at 650 units/hr Check anti-Xa level in 8 hours, also aptt in 8 hours and daily while on heparin Continue to monitor H&H and platelets  GDonna ChristenCoffee 12/07/2017,6:33 PM

## 2017-12-07 NOTE — Progress Notes (Addendum)
Occupational Therapy Treatment Patient Details Name: Melissa [name] MRN: [id number] DOB: 05-09-1941 Today's Date: 12/07/2017    History of present illness Pt is a 77 y/o Female who was admitted to [facility] for angioplasty in the left distal anterior tibial and dorsalis pedis arteries.  Pt's PMH includes COPD, back pain.    OT comments  Pt. Presents with weakness, limited activity tolerance, and limited functional mobility which limits her ability to complete basic ADL and IADL functioning. Pt. Resides at home with her husband. Pt. Was independent with ADLs, and IADL functioning: including meal preparation, driving, and medication management. Pt. had difficulty performing basic morning care approximately one week prior to admission secondary to pain with weightbearing. Pt.' s daughter is concerned about the patients ability to access her shower at home, perform cooking tasks, and kitchen management. Pt.'s husband is limited with his ability to physically assist the pt. at home. Pt. Will have a son check in, and occasionally provide meals. Pt.'s daughter resides in [place], and will be out of town next week. Pt. Could benefit from OT services for ADL training, Energy conservation, and work simplification techniques, and pt. Education about home modification, and DMW Pt. Plans to return home upon discharge with her husband, and limited family assistance. Pt. Could benefit from follow-up [place] services upon discharge.   Follow Up Recommendations  Home health OT    Equipment Recommendations       Recommendations for Other Services      Precautions / Restrictions Precautions Precautions: Fall Restrictions Weight Bearing Restrictions: Yes LLE Weight Bearing: Weight bearing as tolerated       Mobility Bed Mobility                  Transfers Overall transfer level: Needs assistance Equipment used: Rolling walker (2 wheeled) Transfers: Sit to/from Stand Sit to Stand: Min guard          General transfer comment: Mobility per PT    Balance                                           ADL either performed or assessed with clinical judgement   ADL Overall ADL's : Needs assistance/impaired Eating/Feeding: Independent;Set up   Grooming: Set up;Min guard   Upper Body Bathing: Min guard;Set up   Lower Body Bathing: Set up;Minimal assistance   Upper Body Dressing : Set up;Min guard   Lower Body Dressing: Set up;Minimal assistance               Functional mobility during ADLs: Min guard General ADL Comments: Pt. and daughter education was provided about [place] services.     Vision Patient Visual Report: No change from baseline     Perception     Praxis      Cognition Arousal/Alertness: Awake/alert Behavior During Therapy: WFL for tasks assessed/performed Overall Cognitive Status: Within Functional Limits for tasks assessed                                          Exercises     Shoulder Instructions       General Comments      Pertinent Vitals/ Pain       Pain Assessment: 0-10 Pain Score: 2  Pain Location: left LE Pain Descriptors /  Indicators: Aching Pain Intervention(s): Limited activity within patient's tolerance;Monitored during session;Repositioned  Home Living Family/patient expects to be discharged to:: Private residence Living Arrangements: Spouse/significant other Available Help at Discharge: Family;Available 24 hours/day Type of Home: House Home Access: Stairs to enter [employer] of Steps: 1 Entrance Stairs-Rails: None Home Layout: One level(one step up into the bathroom)     Bathroom Shower/Tub: Walk-in Hydrologist: Standard     Home Equipment: Cane - single point;Bedside commode;Shower seat;Walker - 2 wheels;Grab bars - tub/shower   Additional Comments: Pt lives with husband who cannot provide physical assist but will be able to assist with providing meals  and bringing pt anything she needs.  Two children live very close and will be able to do the grocery shopping and running errands.       Prior Functioning/Environment Level of Independence: Independent with assistive device(s)            Frequency  Min 2X/week        Progress Toward Goals  OT Goals(current goals can now be found in the care plan section)     Acute Rehab OT Goals Patient Stated Goal: To regain independence  OT Goal Formulation: With patient  Plan      Co-evaluation                 AM-PAC PT "6 Clicks" Daily Activity     Outcome Measure   Help from another person eating meals?: None Help from another person taking care of personal grooming?: A Little Help from another person toileting, which includes using toliet, bedpan, or urinal?: A Little Help from another person bathing (including washing, rinsing, drying)?: A Lot Help from another person to put on and taking off regular upper body clothing?: A Little Help from another person to put on and taking off regular lower body clothing?: A Little 6 Click Score: 18    End of Session    OT Visit Diagnosis: Muscle weakness (generalized) (M62.81)   Activity Tolerance Patient tolerated treatment well   Patient Left in bed   Nurse Communication Mobility status        Time: 5093-2671 OT Time Calculation (min): 26 min  Charges: OT General Charges $OT Visit: 1 Visit OT Evaluation $OT Eval Moderate Complexity: 1 Mod  Harrel Carina, MS, OTR/L    Harrel Carina, MS, OTR/L 12/07/2017, 10:50 AM

## 2017-12-07 NOTE — Progress Notes (Signed)
[name]  Daily Progress Note   Subjective  - 2 Days Post-Op  Patient seen in the early afternoon although at that point I saw her she had reasonable pain control she admits that earlier this morning she was in excruciating pain and required IV morphine.  Objective Vitals:   12/07/17 0559 12/07/17 1037 12/07/17 1340 12/07/17 2126  BP: 130/63 133/69 (!) 117/59 104/85  Pulse: 84  70 72  Resp: 20  16 18   Temp: 99.6 F (37.6 C)  98.5 F (36.9 C) 99.5 F (37.5 C)  TempSrc: Oral  Oral Oral  SpO2: 96%  96% 95%  Weight:      Height:        Intake/Output Summary (Last 24 hours) at 12/07/2017 2136 Last data filed at 12/07/2017 1600 Gross per 24 hour  Intake 80 ml  Output 600 ml  Net -520 ml    PULM  Normal effort , no use of accessory muscles CV  No JVD, RRR Abd      No distended, nontender VASC  left foot is much paler and very cold now from the heel to the toes  Laboratory CBC    Component Value Date/Time   WBC 12.7 (H) 12/06/2017 1447   HGB 9.0 (L) 12/06/2017 1447   HGB 11.6 (L) 06/16/2013 1031   HCT 26.9 (L) 12/06/2017 1447   HCT 34.8 (L) 06/16/2013 1031   PLT 206 12/06/2017 1447   PLT 226 06/16/2013 1031    BMET    Component Value Date/Time   NA 142 04/24/2017 0440   NA 139 06/16/2013 1031   K 4.1 04/24/2017 0440   K 3.8 06/16/2013 1031   CL 103 04/24/2017 0440   CL 105 06/16/2013 1031   CO2 28 04/24/2017 0440   CO2 30 06/16/2013 1031   GLUCOSE 144 (H) 04/24/2017 0440   GLUCOSE 96 06/16/2013 1031   BUN 24 (H) 12/05/2017 1355   BUN 23 (H) 06/16/2013 1031   CREATININE 0.60 12/05/2017 1355   CREATININE 0.58 (L) 06/16/2013 1031   CALCIUM 9.7 04/24/2017 0440   CALCIUM 9.7 06/16/2013 1031   GFRNONAA >60 12/05/2017 1355   GFRNONAA >60 06/16/2013 1031   GFRAA >60 12/05/2017 1355   GFRAA >60 06/16/2013 1031    Assessment/Planning: POD #2 s/p revascularization  Given today's exam which appears to deteriorated significantly from  yesterday I have concerned that she will require amputation.  I do not have any further procedures available.  Her distal outflow is terribly compromised although I am able to achieve successful angioplasty does not appear to be staying open.  I will stop her Eliquis and restart her on heparin.  I do not feel that she can go home she is still requiring parenteral pain medication.  I will continue to reassess over the next few days to determine whether we will be required to forward with amputation.   Hortencia [name]  12/07/2017, 9:36 PM

## 2017-12-08 DIAGNOSIS — T40605A Adverse effect of unspecified narcotics, initial encounter: Secondary | ICD-10-CM | POA: Diagnosis not present

## 2017-12-08 DIAGNOSIS — M069 Rheumatoid arthritis, unspecified: Secondary | ICD-10-CM | POA: Diagnosis present

## 2017-12-08 DIAGNOSIS — I998 Other disorder of circulatory system: Secondary | ICD-10-CM | POA: Diagnosis present

## 2017-12-08 DIAGNOSIS — K219 Gastro-esophageal reflux disease without esophagitis: Secondary | ICD-10-CM | POA: Diagnosis present

## 2017-12-08 DIAGNOSIS — Z90710 Acquired absence of both cervix and uterus: Secondary | ICD-10-CM | POA: Diagnosis not present

## 2017-12-08 DIAGNOSIS — I70222 Atherosclerosis of native arteries of extremities with rest pain, left leg: Secondary | ICD-10-CM | POA: Diagnosis not present

## 2017-12-08 DIAGNOSIS — Z881 Allergy status to other antibiotic agents status: Secondary | ICD-10-CM | POA: Diagnosis not present

## 2017-12-08 DIAGNOSIS — Z981 Arthrodesis status: Secondary | ICD-10-CM | POA: Diagnosis not present

## 2017-12-08 DIAGNOSIS — Y92239 Unspecified place in hospital as the place of occurrence of the external cause: Secondary | ICD-10-CM | POA: Diagnosis present

## 2017-12-08 DIAGNOSIS — I10 Essential (primary) hypertension: Secondary | ICD-10-CM | POA: Diagnosis present

## 2017-12-08 DIAGNOSIS — I70262 Atherosclerosis of native arteries of extremities with gangrene, left leg: Secondary | ICD-10-CM | POA: Diagnosis not present

## 2017-12-08 DIAGNOSIS — J440 Chronic obstructive pulmonary disease with acute lower respiratory infection: Secondary | ICD-10-CM | POA: Diagnosis not present

## 2017-12-08 DIAGNOSIS — J189 Pneumonia, unspecified organism: Secondary | ICD-10-CM | POA: Diagnosis not present

## 2017-12-08 DIAGNOSIS — I7025 Atherosclerosis of native arteries of other extremities with ulceration: Secondary | ICD-10-CM | POA: Diagnosis present

## 2017-12-08 DIAGNOSIS — I7301 Raynaud's syndrome with gangrene: Secondary | ICD-10-CM | POA: Diagnosis present

## 2017-12-08 DIAGNOSIS — R4182 Altered mental status, unspecified: Secondary | ICD-10-CM | POA: Diagnosis not present

## 2017-12-08 DIAGNOSIS — R6521 Severe sepsis with septic shock: Secondary | ICD-10-CM | POA: Diagnosis not present

## 2017-12-08 DIAGNOSIS — Z888 Allergy status to other drugs, medicaments and biological substances status: Secondary | ICD-10-CM | POA: Diagnosis not present

## 2017-12-08 DIAGNOSIS — Z96652 Presence of left artificial knee joint: Secondary | ICD-10-CM | POA: Diagnosis present

## 2017-12-08 DIAGNOSIS — Z882 Allergy status to sulfonamides status: Secondary | ICD-10-CM | POA: Diagnosis not present

## 2017-12-08 DIAGNOSIS — T8144XA Sepsis following a procedure, initial encounter: Secondary | ICD-10-CM | POA: Diagnosis not present

## 2017-12-08 DIAGNOSIS — E876 Hypokalemia: Secondary | ICD-10-CM | POA: Diagnosis not present

## 2017-12-08 DIAGNOSIS — I999 Unspecified disorder of circulatory system: Secondary | ICD-10-CM | POA: Diagnosis present

## 2017-12-08 LAB — CBC
HCT: 26.2 % — ABNORMAL LOW (ref 35.0–47.0)
Hemoglobin: 8.7 g/dL — ABNORMAL LOW (ref 12.0–16.0)
MCH: 31.3 pg (ref 26.0–34.0)
MCHC: 33.1 g/dL (ref 32.0–36.0)
MCV: 94.6 fL (ref 80.0–100.0)
Platelets: 209 10*3/uL (ref 150–440)
RBC: 2.77 MIL/uL — ABNORMAL LOW (ref 3.80–5.20)
RDW: 14.3 % (ref 11.5–14.5)
WBC: 11.6 10*3/uL — ABNORMAL HIGH (ref 3.6–11.0)

## 2017-12-08 LAB — APTT: APTT: 45 s — AB (ref 24–36)

## 2017-12-08 LAB — HEPARIN LEVEL (UNFRACTIONATED)
HEPARIN UNFRACTIONATED: 0.68 [IU]/mL (ref 0.30–0.70)
Heparin Unfractionated: 0.36 IU/mL (ref 0.30–0.70)

## 2017-12-08 NOTE — Progress Notes (Signed)
ANTICOAGULATION CONSULT NOTE - Initial Consult  Pharmacy Consult for heparin gtt Indication: ischemia of leg   Allergies  Allergen Reactions  . Lodine [Etodolac] Hives and Itching  . Methotrexate Derivatives Other (See Comments)    Flu like symptoms  . Nitrofuran Derivatives Nausea And Vomiting and Other (See Comments)    Made sicker   . Norvasc [Amlodipine] Other (See Comments)    Unknown   . Pentasa [Mesalamine Er] Other (See Comments)    Unknown   . Red Yeast Rice Extract Other (See Comments)    Patient preference   . Vioxx [Rofecoxib] Other (See Comments)    Unknown   . Levaquin [Levofloxacin] Itching, Rash and Other (See Comments)    Muscle pain  . Sulfa Antibiotics Itching and Rash    Patient Measurements: Height: 5' 4"  (162.6 cm) Weight: 122 lb (55.3 kg) IBW/kg (Calculated) : 54.7 Heparin Dosing Weight: 55.3kg  Vital Signs: Temp: 99.5 F (37.5 C) (05/24 2126) Temp Source: Oral (05/24 2126) BP: 104/85 (05/24 2126) Pulse Rate: 72 (05/24 2126)  Labs: Recent Labs    12/05/17 1355 12/06/17 1447 12/07/17 1836 12/08/17 0224  HGB  --  9.0*  --  8.7*  HCT  --  26.9*  --  26.2*  PLT  --  206  --  209  APTT  --   --  35 45*  LABPROT  --   --  14.8  --   INR  --   --  1.17  --   HEPARINUNFRC  --   --   --  0.68  CREATININE 0.60  --   --   --     Estimated Creatinine Clearance: 51.7 mL/min (by C-G formula based on SCr of 0.6 mg/dL).   Medical History: Past Medical History:  Diagnosis Date  . Anemia   . Anxiety   . Arthritis    rheumatoid  . Ataxia   . COPD (chronic obstructive pulmonary disease) ([place])   . Depression   . Diverticulosis   . Excessive falling   . GERD (gastroesophageal reflux disease)   . Hyperlipidemia   . Hypertension   . IBS (irritable bowel syndrome)   . Osteoarthritis   . Psoriasis   . Raynaud's disease without gangrene   . Raynaud's disease without gangrene   . Shortness of breath dyspnea    with exertion    Medications:   Medications Prior to Admission  Medication Sig Dispense Refill Last Dose  . acetaminophen (TYLENOL) 500 MG tablet Take 1,000 mg by mouth every 8 (eight) hours as needed for mild pain or headache.   Past Month at Unknown time  . aspirin EC 81 MG tablet Take 1 tablet (81 mg total) by mouth daily.   12/04/2017 at Unknown time  . budesonide (ENTOCORT EC) 3 MG 24 hr capsule Take 3-6 mg by mouth See admin instructions. Pt takes 3 mg one day, 6 mg the next alternating doses   12/05/2017 at Unknown time  . Calcium Carbonate-Vitamin D (CALCIUM 600+D) 600-400 MG-UNIT tablet Take 1 tablet by mouth 2 (two) times daily.   12/04/2017 at Unknown time  . clopidogrel (PLAVIX) 75 MG tablet Take 1 tablet (75 mg total) by mouth daily. 30 tablet 4 12/04/2017 at Unknown time  . Cranberry 500 MG CAPS Take 500 mg by mouth daily.   12/05/2017 at Unknown time  . ferrous sulfate 325 (65 FE) MG tablet Take 325 mg by mouth daily with breakfast.   12/04/2017 at Unknown time  .  gemfibrozil (LOPID) 600 MG tablet Take 600 mg by mouth 2 (two) times daily before a meal.   12/05/2017 at Unknown time  . hydroxychloroquine (PLAQUENIL) 200 MG tablet Take 200 mg by mouth 2 (two) times daily.    12/05/2017 at Unknown time  . leflunomide (ARAVA) 20 MG tablet Take 20 mg by mouth daily.   12/05/2017 at Unknown time  . lisinopril (PRINIVIL,ZESTRIL) 10 MG tablet Take 10 mg by mouth daily.   12/05/2017 at Unknown time  . LORazepam (ATIVAN) 1 MG tablet Take 1 tablet (1 mg total) by mouth 2 (two) times daily as needed for anxiety. 10 tablet 0 Past Week at Unknown time  . Multiple Vitamin (MULTIVITAMIN) tablet Take 1 tablet by mouth daily.   12/04/2017 at Unknown time  . Multiple Vitamins-Minerals (PRESERVISION AREDS) CAPS Take 1 capsule by mouth 2 (two) times daily.   12/04/2017 at Unknown time  . omeprazole (PRILOSEC) 20 MG capsule Take 20 mg by mouth 2 (two) times daily before a meal.    12/05/2017 at Unknown time  . oxyCODONE-acetaminophen  (PERCOCET/ROXICET) 5-325 MG tablet One To Two Tabs By Mouth Every Six Hours As Needed For Pain 21 tablet 0 12/05/2017 at Unknown time  . pravastatin (PRAVACHOL) 80 MG tablet Take 80 mg by mouth at bedtime.    12/04/2017 at Unknown time  . predniSONE (DELTASONE) 5 MG tablet Take 5 mg by mouth daily with breakfast.   12/05/2017 at Unknown time  . sertraline (ZOLOFT) 50 MG tablet Take 50 mg by mouth at bedtime.    12/04/2017 at Unknown time  . apixaban (ELIQUIS) 5 MG TABS tablet Take 1 tablet (5 mg total) by mouth 2 (two) times daily. 60 tablet  12/01/2017  . cephALEXin (KEFLEX) 500 MG capsule Take 500 mg by mouth 3 (three) times daily. For 10 days - starting 11/21/2017   Not Taking at Unknown time  . diltiazem (CARDIZEM CD) 240 MG 24 hr capsule Take 240 mg by mouth daily.   Not Taking at Unknown time  . estradiol (ESTRACE) 0.1 MG/GM vaginal cream Place 1 g vaginally 3 (three) times a week. (Patient not taking: Reported on 11/27/2017) 42.5 g 12 Not Taking at Unknown time  . gabapentin (NEURONTIN) 300 MG capsule Take 1 capsule (300 mg total) by mouth daily. 30 capsule 0   . tiZANidine (ZANAFLEX) 4 MG tablet Take 4 mg by mouth daily as needed for muscle spasms (neck pain).    Not Taking at Unknown time   Scheduled:  . aspirin EC  81 mg Oral Daily  . bacitracin   Topical Daily  . clopidogrel  75 mg Oral Daily  . diltiazem  240 mg Oral Daily  . ferrous sulfate  325 mg Oral Q breakfast  . gabapentin  300 mg Oral Daily  . gemfibrozil  600 mg Oral BID AC  . hydroxychloroquine  200 mg Oral BID  . leflunomide  20 mg Oral Daily  . lisinopril  10 mg Oral Daily  . multivitamin with minerals  1 tablet Oral Daily  . pantoprazole  40 mg Oral Daily  . pravastatin  80 mg Oral QHS  . predniSONE  5 mg Oral Q breakfast  . sertraline  50 mg Oral QHS  . sodium chloride flush  3 mL Intravenous Q12H   Infusions:  . sodium chloride    . heparin 650 Units/hr (12/07/17 1855)   PRN: sodium chloride, acetaminophen,  acetaminophen, hydrALAZINE, labetalol, LORazepam, morphine injection, ondansetron (ZOFRAN) IV, oxyCODONE, sodium  chloride flush, tiZANidine Anti-infectives (From admission, onward)   Start     Dose/Rate Route Frequency Ordered Stop   12/05/17 2200  hydroxychloroquine (PLAQUENIL) tablet 200 mg     200 mg Oral 2 times daily 12/05/17 1805     12/04/17 2245  ceFAZolin (ANCEF) IVPB 2g/100 mL premix     2 g 200 mL/hr over 30 Minutes Intravenous  Once 12/04/17 2241 12/05/17 1602      Assessment: 77 year old female was on eliquis 63m po bid, now Dr SDelana Meyerand KHerbert Moorswould like heparin gtt despite Hgb 9.0. Discontinuing apixaban and starting heparin gtt [place] direction of [place] we discussed hgb 9.0 via phone. Would like to proceed no bolus for heparin.    Goal of Therapy:  Heparin level 0.3-0.7 units/ml aPTT 66-102 seconds Monitor platelets by anticoagulation protocol: Yes   Plan:  05/25 @ 0224 HL 0.68 therapeutic, aPTT 45 sec. Since anti-Xa within range will dose off of anti-Xa. Will continue at current rate of 650 units/hr and will recheck anti-Xa @ 1000.   DTobie Lords PharmD, BCPS Clinical Pharmacist 12/08/2017

## 2017-12-08 NOTE — Progress Notes (Signed)
PT Cancellation Note  Patient Details Name: Melissa [name] MRN: [id number] DOB: [birth year]   Cancelled Treatment:    Reason Eval/Treat Not Completed: Pain limiting ability to participate;Patient's level of consciousness.  Refused for pain earlier then when PT returned wanted to sleep.  Try again at another time to see if pt is willing to do any therapy.   Ramond Dial 12/08/2017, 10:12 AM   Mee Hives, PT MS Acute Rehab Dept. Number: [place]

## 2017-12-08 NOTE — Progress Notes (Signed)
[place] for heparin gtt Indication: ischemia of leg   Allergies  Allergen Reactions  . Lodine [Etodolac] Hives and Itching  . Methotrexate Derivatives Other (See Comments)    Flu like symptoms  . Nitrofuran Derivatives Nausea And Vomiting and Other (See Comments)    Made sicker   . Norvasc [Amlodipine] Other (See Comments)    Unknown   . Pentasa [Mesalamine Er] Other (See Comments)    Unknown   . Red Yeast Rice Extract Other (See Comments)    Patient preference   . Vioxx [Rofecoxib] Other (See Comments)    Unknown   . Levaquin [Levofloxacin] Itching, Rash and Other (See Comments)    Muscle pain  . Sulfa Antibiotics Itching and Rash    Patient Measurements: Height: 5' 4"  (162.6 cm) Weight: 122 lb (55.3 kg) IBW/kg (Calculated) : 54.7 Heparin Dosing Weight: 55.3kg  Vital Signs: Temp: 98.5 F (36.9 C) (05/25 0452) Temp Source: Oral (05/25 0452) BP: 121/58 (05/25 0452) Pulse Rate: 71 (05/25 0452)  Labs: Recent Labs    12/05/17 1355 12/06/17 1447 12/07/17 1836 12/08/17 0224 12/08/17 0953  HGB  --  9.0*  --  8.7*  --   HCT  --  26.9*  --  26.2*  --   PLT  --  206  --  209  --   APTT  --   --  35 45*  --   LABPROT  --   --  14.8  --   --   INR  --   --  1.17  --   --   HEPARINUNFRC  --   --   --  0.68 0.36  CREATININE 0.60  --   --   --   --     Estimated Creatinine Clearance: 51.7 mL/min (by C-G formula based on SCr of 0.6 mg/dL).   Medical History: Past Medical History:  Diagnosis Date  . Anemia   . Anxiety   . Arthritis    rheumatoid  . Ataxia   . COPD (chronic obstructive pulmonary disease) ([place])   . Depression   . Diverticulosis   . Excessive falling   . GERD (gastroesophageal reflux disease)   . Hyperlipidemia   . Hypertension   . IBS (irritable bowel syndrome)   . Osteoarthritis   . Psoriasis   . Raynaud's disease without gangrene   . Raynaud's disease without gangrene   . Shortness of breath dyspnea     with exertion    Medications:  Medications Prior to Admission  Medication Sig Dispense Refill Last Dose  . acetaminophen (TYLENOL) 500 MG tablet Take 1,000 mg by mouth every 8 (eight) hours as needed for mild pain or headache.   Past Month at Unknown time  . aspirin EC 81 MG tablet Take 1 tablet (81 mg total) by mouth daily.   12/04/2017 at Unknown time  . budesonide (ENTOCORT EC) 3 MG 24 hr capsule Take 3-6 mg by mouth See admin instructions. Pt takes 3 mg one day, 6 mg the next alternating doses   12/05/2017 at Unknown time  . Calcium Carbonate-Vitamin D (CALCIUM 600+D) 600-400 MG-UNIT tablet Take 1 tablet by mouth 2 (two) times daily.   12/04/2017 at Unknown time  . clopidogrel (PLAVIX) 75 MG tablet Take 1 tablet (75 mg total) by mouth daily. 30 tablet 4 12/04/2017 at Unknown time  . Cranberry 500 MG CAPS Take 500 mg by mouth daily.   12/05/2017 at Unknown time  .  ferrous sulfate 325 (65 FE) MG tablet Take 325 mg by mouth daily with breakfast.   12/04/2017 at Unknown time  . gemfibrozil (LOPID) 600 MG tablet Take 600 mg by mouth 2 (two) times daily before a meal.   12/05/2017 at Unknown time  . hydroxychloroquine (PLAQUENIL) 200 MG tablet Take 200 mg by mouth 2 (two) times daily.    12/05/2017 at Unknown time  . leflunomide (ARAVA) 20 MG tablet Take 20 mg by mouth daily.   12/05/2017 at Unknown time  . lisinopril (PRINIVIL,ZESTRIL) 10 MG tablet Take 10 mg by mouth daily.   12/05/2017 at Unknown time  . LORazepam (ATIVAN) 1 MG tablet Take 1 tablet (1 mg total) by mouth 2 (two) times daily as needed for anxiety. 10 tablet 0 Past Week at Unknown time  . Multiple Vitamin (MULTIVITAMIN) tablet Take 1 tablet by mouth daily.   12/04/2017 at Unknown time  . Multiple Vitamins-Minerals (PRESERVISION AREDS) CAPS Take 1 capsule by mouth 2 (two) times daily.   12/04/2017 at Unknown time  . omeprazole (PRILOSEC) 20 MG capsule Take 20 mg by mouth 2 (two) times daily before a meal.    12/05/2017 at Unknown time  .  oxyCODONE-acetaminophen (PERCOCET/ROXICET) 5-325 MG tablet One To Two Tabs By Mouth Every Six Hours As Needed For Pain 21 tablet 0 12/05/2017 at Unknown time  . pravastatin (PRAVACHOL) 80 MG tablet Take 80 mg by mouth at bedtime.    12/04/2017 at Unknown time  . predniSONE (DELTASONE) 5 MG tablet Take 5 mg by mouth daily with breakfast.   12/05/2017 at Unknown time  . sertraline (ZOLOFT) 50 MG tablet Take 50 mg by mouth at bedtime.    12/04/2017 at Unknown time  . apixaban (ELIQUIS) 5 MG TABS tablet Take 1 tablet (5 mg total) by mouth 2 (two) times daily. 60 tablet  12/01/2017  . cephALEXin (KEFLEX) 500 MG capsule Take 500 mg by mouth 3 (three) times daily. For 10 days - starting 11/21/2017   Not Taking at Unknown time  . diltiazem (CARDIZEM CD) 240 MG 24 hr capsule Take 240 mg by mouth daily.   Not Taking at Unknown time  . estradiol (ESTRACE) 0.1 MG/GM vaginal cream Place 1 g vaginally 3 (three) times a week. (Patient not taking: Reported on 11/27/2017) 42.5 g 12 Not Taking at Unknown time  . gabapentin (NEURONTIN) 300 MG capsule Take 1 capsule (300 mg total) by mouth daily. 30 capsule 0   . tiZANidine (ZANAFLEX) 4 MG tablet Take 4 mg by mouth daily as needed for muscle spasms (neck pain).    Not Taking at Unknown time   Scheduled:  . aspirin EC  81 mg Oral Daily  . bacitracin   Topical Daily  . clopidogrel  75 mg Oral Daily  . diltiazem  240 mg Oral Daily  . ferrous sulfate  325 mg Oral Q breakfast  . gabapentin  300 mg Oral Daily  . gemfibrozil  600 mg Oral BID AC  . hydroxychloroquine  200 mg Oral BID  . leflunomide  20 mg Oral Daily  . lisinopril  10 mg Oral Daily  . multivitamin with minerals  1 tablet Oral Daily  . pantoprazole  40 mg Oral Daily  . pravastatin  80 mg Oral QHS  . predniSONE  5 mg Oral Q breakfast  . sertraline  50 mg Oral QHS  . sodium chloride flush  3 mL Intravenous Q12H   Infusions:  . sodium chloride    .  heparin 650 Units/hr (12/07/17 1855)   PRN: sodium  chloride, acetaminophen, acetaminophen, hydrALAZINE, labetalol, LORazepam, morphine injection, ondansetron (ZOFRAN) IV, oxyCODONE, sodium chloride flush, tiZANidine Anti-infectives (From admission, onward)   Start     Dose/Rate Route Frequency Ordered Stop   12/05/17 2200  hydroxychloroquine (PLAQUENIL) tablet 200 mg     200 mg Oral 2 times daily 12/05/17 1805     12/04/17 2245  ceFAZolin (ANCEF) IVPB 2g/100 mL premix     2 g 200 mL/hr over 30 Minutes Intravenous  Once 12/04/17 2241 12/05/17 1602      Assessment: 77 year old female was on eliquis 103m po bid, now Dr SDelana Meyerand KHerbert Moorswould like heparin gtt despite Hgb 9.0. Discontinuing apixaban and starting heparin gtt at the direction of [place] we discussed hgb 9.0 via phone. Would like to proceed no bolus for heparin.   Goal of Therapy:  Heparin level 0.3-0.7 units/ml aPTT 66-102 seconds Monitor platelets by anticoagulation protocol: Yes   Plan:  05/25 @ 0224 HL 0.68 therapeutic, aPTT 45 sec. Since anti-Xa within range will dose off of anti-Xa. Will continue at current rate of 650 units/hr and will recheck anti-Xa @ 1000.   05/25 @ 09:53  HL = 0.36.  Continue current drip rate. Recheck HL with AM labs.   KOlivia Canter RChadron Community Hospital And Health ServicesClinical Pharmacist 12/08/2017

## 2017-12-08 NOTE — Progress Notes (Signed)
    Subjective  -   Still having significant pain requiring IV meds in addition to PO   Physical Exam:  Left foot is cool and tender       Assessment/Plan:  P  We discussed the reality that she is going to come to amputation, likely a BKA.  She has a great attitude about this.  No emergent need to do this over the weekend.  We will continue to treat her pain with IV and PO meds.    Wells Brabham 12/08/2017 1:15 PM --  Vitals:   12/08/17 0452 12/08/17 1155  BP: (!) 121/58 (!) 117/58  Pulse: 71 (!) 104  Resp: 18 18  Temp: 98.5 F (36.9 C) 98.6 F (37 C)  SpO2: 90% 97%    Intake/Output Summary (Last 24 hours) at 12/08/2017 1315 Last data filed at 12/08/2017 1000 Gross per 24 hour  Intake 84.11 ml  Output 500 ml  Net -415.89 ml     Laboratory CBC    Component Value Date/Time   WBC 11.6 (H) 12/08/2017 0224   HGB 8.7 (L) 12/08/2017 0224   HGB 11.6 (L) 06/16/2013 1031   HCT 26.2 (L) 12/08/2017 0224   HCT 34.8 (L) 06/16/2013 1031   PLT 209 12/08/2017 0224   PLT 226 06/16/2013 1031    BMET    Component Value Date/Time   NA 142 04/24/2017 0440   NA 139 06/16/2013 1031   K 4.1 04/24/2017 0440   K 3.8 06/16/2013 1031   CL 103 04/24/2017 0440   CL 105 06/16/2013 1031   CO2 28 04/24/2017 0440   CO2 30 06/16/2013 1031   GLUCOSE 144 (H) 04/24/2017 0440   GLUCOSE 96 06/16/2013 1031   BUN 24 (H) 12/05/2017 1355   BUN 23 (H) 06/16/2013 1031   CREATININE 0.60 12/05/2017 1355   CREATININE 0.58 (L) 06/16/2013 1031   CALCIUM 9.7 04/24/2017 0440   CALCIUM 9.7 06/16/2013 1031   GFRNONAA >60 12/05/2017 1355   GFRNONAA >60 06/16/2013 1031   GFRAA >60 12/05/2017 1355   GFRAA >60 06/16/2013 1031    COAG Lab Results  Component Value Date   INR 1.17 12/07/2017   INR 1.33 04/19/2017   INR 1.06 03/22/2016   No results found for: PTT  Antibiotics Anti-infectives (From admission, onward)   Start     Dose/Rate Route Frequency Ordered Stop   12/05/17 2200   hydroxychloroquine (PLAQUENIL) tablet 200 mg     200 mg Oral 2 times daily 12/05/17 1805     12/04/17 2245  ceFAZolin (ANCEF) IVPB 2g/100 mL premix     2 g 200 mL/hr over 30 Minutes Intravenous  Once 12/04/17 2241 12/05/17 1602       V. Leia Alf, M.D. [facility] Office: [phone] Pager:  [phone]

## 2017-12-09 LAB — HEPARIN LEVEL (UNFRACTIONATED)
HEPARIN UNFRACTIONATED: 0.15 [IU]/mL — AB (ref 0.30–0.70)
Heparin Unfractionated: 0.18 IU/mL — ABNORMAL LOW (ref 0.30–0.70)
Heparin Unfractionated: 0.41 IU/mL (ref 0.30–0.70)

## 2017-12-09 MED ORDER — HEPARIN BOLUS VIA INFUSION: 1650.0000 [IU] | Freq: Once | INTRAVENOUS | Status: DC

## 2017-12-09 MED ORDER — HEPARIN BOLUS VIA INFUSION
1600.0000 [IU] | Freq: Once | INTRAVENOUS | Status: AC
  Administered 2017-12-09: 1600 [IU] via INTRAVENOUS
  Filled 2017-12-09: qty 1600

## 2017-12-09 MED ORDER — LORAZEPAM 0.5 MG PO TABS
0.5000 mg | ORAL_TABLET | Freq: Two times a day (BID) | ORAL | Status: DC
  Administered 2017-12-09 – 2017-12-10 (×3): 0.5 mg via ORAL
  Filled 2017-12-09 (×3): qty 1

## 2017-12-09 MED ORDER — HYDROMORPHONE HCL 1 MG/ML IJ SOLN
0.5000 mg | INTRAMUSCULAR | Status: DC | PRN
  Administered 2017-12-09 – 2017-12-13 (×5): 0.5 mg via INTRAVENOUS
  Filled 2017-12-09 (×6): qty 0.5

## 2017-12-09 NOTE — Progress Notes (Signed)
[place] for heparin gtt Indication: ischemia of leg   Allergies  Allergen Reactions  . Lodine [Etodolac] Hives and Itching  . Methotrexate Derivatives Other (See Comments)    Flu like symptoms  . Nitrofuran Derivatives Nausea And Vomiting and Other (See Comments)    Made sicker   . Norvasc [Amlodipine] Other (See Comments)    Unknown   . Pentasa [Mesalamine Er] Other (See Comments)    Unknown   . Red Yeast Rice Extract Other (See Comments)    Patient preference   . Vioxx [Rofecoxib] Other (See Comments)    Unknown   . Levaquin [Levofloxacin] Itching, Rash and Other (See Comments)    Muscle pain  . Sulfa Antibiotics Itching and Rash    Patient Measurements: Height: 5' 4"  (162.6 cm) Weight: 122 lb (55.3 kg) IBW/kg (Calculated) : 54.7 Heparin Dosing Weight: 55.3kg  Vital Signs: Temp: 99.4 F (37.4 C) (05/25 2041) Temp Source: Oral (05/25 2041) BP: 117/62 (05/25 2041) Pulse Rate: 79 (05/25 2041)  Labs: Recent Labs    12/06/17 1447 12/07/17 1836 12/08/17 0224 12/08/17 0953 12/09/17 0434  HGB 9.0*  --  8.7*  --   --   HCT 26.9*  --  26.2*  --   --   PLT 206  --  209  --   --   APTT  --  35 45*  --   --   LABPROT  --  14.8  --   --   --   INR  --  1.17  --   --   --   HEPARINUNFRC  --   --  0.68 0.36 0.15*    Estimated Creatinine Clearance: 51.7 mL/min (by C-G formula based on SCr of 0.6 mg/dL).   Medical History: Past Medical History:  Diagnosis Date  . Anemia   . Anxiety   . Arthritis    rheumatoid  . Ataxia   . COPD (chronic obstructive pulmonary disease) ([place])   . Depression   . Diverticulosis   . Excessive falling   . GERD (gastroesophageal reflux disease)   . Hyperlipidemia   . Hypertension   . IBS (irritable bowel syndrome)   . Osteoarthritis   . Psoriasis   . Raynaud's disease without gangrene   . Raynaud's disease without gangrene   . Shortness of breath dyspnea    with exertion    Medications:   Medications Prior to Admission  Medication Sig Dispense Refill Last Dose  . acetaminophen (TYLENOL) 500 MG tablet Take 1,000 mg by mouth every 8 (eight) hours as needed for mild pain or headache.   Past Month at Unknown time  . aspirin EC 81 MG tablet Take 1 tablet (81 mg total) by mouth daily.   12/04/2017 at Unknown time  . budesonide (ENTOCORT EC) 3 MG 24 hr capsule Take 3-6 mg by mouth See admin instructions. Pt takes 3 mg one day, 6 mg the next alternating doses   12/05/2017 at Unknown time  . Calcium Carbonate-Vitamin D (CALCIUM 600+D) 600-400 MG-UNIT tablet Take 1 tablet by mouth 2 (two) times daily.   12/04/2017 at Unknown time  . clopidogrel (PLAVIX) 75 MG tablet Take 1 tablet (75 mg total) by mouth daily. 30 tablet 4 12/04/2017 at Unknown time  . Cranberry 500 MG CAPS Take 500 mg by mouth daily.   12/05/2017 at Unknown time  . ferrous sulfate 325 (65 FE) MG tablet Take 325 mg by mouth daily with breakfast.  12/04/2017 at Unknown time  . gemfibrozil (LOPID) 600 MG tablet Take 600 mg by mouth 2 (two) times daily before a meal.   12/05/2017 at Unknown time  . hydroxychloroquine (PLAQUENIL) 200 MG tablet Take 200 mg by mouth 2 (two) times daily.    12/05/2017 at Unknown time  . leflunomide (ARAVA) 20 MG tablet Take 20 mg by mouth daily.   12/05/2017 at Unknown time  . lisinopril (PRINIVIL,ZESTRIL) 10 MG tablet Take 10 mg by mouth daily.   12/05/2017 at Unknown time  . LORazepam (ATIVAN) 1 MG tablet Take 1 tablet (1 mg total) by mouth 2 (two) times daily as needed for anxiety. 10 tablet 0 Past Week at Unknown time  . Multiple Vitamin (MULTIVITAMIN) tablet Take 1 tablet by mouth daily.   12/04/2017 at Unknown time  . Multiple Vitamins-Minerals (PRESERVISION AREDS) CAPS Take 1 capsule by mouth 2 (two) times daily.   12/04/2017 at Unknown time  . omeprazole (PRILOSEC) 20 MG capsule Take 20 mg by mouth 2 (two) times daily before a meal.    12/05/2017 at Unknown time  . oxyCODONE-acetaminophen  (PERCOCET/ROXICET) 5-325 MG tablet One To Two Tabs By Mouth Every Six Hours As Needed For Pain 21 tablet 0 12/05/2017 at Unknown time  . pravastatin (PRAVACHOL) 80 MG tablet Take 80 mg by mouth at bedtime.    12/04/2017 at Unknown time  . predniSONE (DELTASONE) 5 MG tablet Take 5 mg by mouth daily with breakfast.   12/05/2017 at Unknown time  . sertraline (ZOLOFT) 50 MG tablet Take 50 mg by mouth at bedtime.    12/04/2017 at Unknown time  . apixaban (ELIQUIS) 5 MG TABS tablet Take 1 tablet (5 mg total) by mouth 2 (two) times daily. 60 tablet  12/01/2017  . cephALEXin (KEFLEX) 500 MG capsule Take 500 mg by mouth 3 (three) times daily. For 10 days - starting 11/21/2017   Not Taking at Unknown time  . diltiazem (CARDIZEM CD) 240 MG 24 hr capsule Take 240 mg by mouth daily.   Not Taking at Unknown time  . estradiol (ESTRACE) 0.1 MG/GM vaginal cream Place 1 g vaginally 3 (three) times a week. (Patient not taking: Reported on 11/27/2017) 42.5 g 12 Not Taking at Unknown time  . gabapentin (NEURONTIN) 300 MG capsule Take 1 capsule (300 mg total) by mouth daily. 30 capsule 0   . tiZANidine (ZANAFLEX) 4 MG tablet Take 4 mg by mouth daily as needed for muscle spasms (neck pain).    Not Taking at Unknown time   Scheduled:  . aspirin EC  81 mg Oral Daily  . bacitracin   Topical Daily  . clopidogrel  75 mg Oral Daily  . diltiazem  240 mg Oral Daily  . ferrous sulfate  325 mg Oral Q breakfast  . gabapentin  300 mg Oral Daily  . gemfibrozil  600 mg Oral BID AC  . hydroxychloroquine  200 mg Oral BID  . leflunomide  20 mg Oral Daily  . lisinopril  10 mg Oral Daily  . multivitamin with minerals  1 tablet Oral Daily  . pantoprazole  40 mg Oral Daily  . pravastatin  80 mg Oral QHS  . predniSONE  5 mg Oral Q breakfast  . sertraline  50 mg Oral QHS  . sodium chloride flush  3 mL Intravenous Q12H   Infusions:  . sodium chloride    . heparin 650 Units/hr (12/09/17 0312)   PRN: sodium chloride, acetaminophen,  acetaminophen, hydrALAZINE, labetalol, LORazepam, morphine  injection, ondansetron (ZOFRAN) IV, oxyCODONE, sodium chloride flush, tiZANidine Anti-infectives (From admission, onward)   Start     Dose/Rate Route Frequency Ordered Stop   12/05/17 2200  hydroxychloroquine (PLAQUENIL) tablet 200 mg     200 mg Oral 2 times daily 12/05/17 1805     12/04/17 2245  ceFAZolin (ANCEF) IVPB 2g/100 mL premix     2 g 200 mL/hr over 30 Minutes Intravenous  Once 12/04/17 2241 12/05/17 1602      Assessment: 77 year old female was on eliquis 18m po bid, now Dr SDelana Meyerand KHerbert Moorswould like heparin gtt despite Hgb 9.0. Discontinuing apixaban and starting heparin gtt at the direction of [place] we discussed hgb 9.0 via phone. Would like to proceed no bolus for heparin.   Goal of Therapy:  Heparin level 0.3-0.7 units/ml aPTT 66-102 seconds Monitor platelets by anticoagulation protocol: Yes   Plan:  05/25 @ 0224 HL 0.68 therapeutic, aPTT 45 sec. Since anti-Xa within range will dose off of anti-Xa. Will continue at current rate of 650 units/hr and will recheck anti-Xa @ 1000.   05/25 @ 09:53  HL = 0.36.  Continue current drip rate. Recheck HL with AM labs.   05/25 @ 0500 HL 0.15 subtherapeutic. Will omit bolus and increase rate to 800 units/hr. hgb 9.0 >> 8.7. Will recheck CBC w/ am labs.  DTobie Lords PharmD, BCPS Clinical Pharmacist 12/09/2017

## 2017-12-09 NOTE — Progress Notes (Signed)
    Subjective  -   Again had trouble last night with pain and agitation   Physical Exam:  Ischemic left leg    Assessment/Plan:    Made adjustments to pain meds:  Changed ativan to 0.5 mg BID scheduled Stopped morphine and started dilaudid Spoke with daughter about the need for amputation in the immediate future Continue IV heparin Repeat labs in am  Wells Jarome Trull 12/09/2017 9:43 AM --  Vitals:   12/08/17 2041 12/09/17 0605  BP: 117/62 (!) 128/55  Pulse: 79 64  Resp: 20 18  Temp: 99.4 F (37.4 C) 98.3 F (36.8 C)  SpO2: 95% 94%    Intake/Output Summary (Last 24 hours) at 12/09/2017 0943 Last data filed at 12/08/2017 2030 Gross per 24 hour  Intake 134.75 ml  Output 200 ml  Net -65.25 ml     Laboratory CBC    Component Value Date/Time   WBC 11.6 (H) 12/08/2017 0224   HGB 8.7 (L) 12/08/2017 0224   HGB 11.6 (L) 06/16/2013 1031   HCT 26.2 (L) 12/08/2017 0224   HCT 34.8 (L) 06/16/2013 1031   PLT 209 12/08/2017 0224   PLT 226 06/16/2013 1031    BMET    Component Value Date/Time   NA 142 04/24/2017 0440   NA 139 06/16/2013 1031   K 4.1 04/24/2017 0440   K 3.8 06/16/2013 1031   CL 103 04/24/2017 0440   CL 105 06/16/2013 1031   CO2 28 04/24/2017 0440   CO2 30 06/16/2013 1031   GLUCOSE 144 (H) 04/24/2017 0440   GLUCOSE 96 06/16/2013 1031   BUN 24 (H) 12/05/2017 1355   BUN 23 (H) 06/16/2013 1031   CREATININE 0.60 12/05/2017 1355   CREATININE 0.58 (L) 06/16/2013 1031   CALCIUM 9.7 04/24/2017 0440   CALCIUM 9.7 06/16/2013 1031   GFRNONAA >60 12/05/2017 1355   GFRNONAA >60 06/16/2013 1031   GFRAA >60 12/05/2017 1355   GFRAA >60 06/16/2013 1031    COAG Lab Results  Component Value Date   INR 1.17 12/07/2017   INR 1.33 04/19/2017   INR 1.06 03/22/2016   No results found for: PTT  Antibiotics Anti-infectives (From admission, onward)   Start     Dose/Rate Route Frequency Ordered Stop   12/05/17 2200  hydroxychloroquine (PLAQUENIL) tablet 200  mg     200 mg Oral 2 times daily 12/05/17 1805     12/04/17 2245  ceFAZolin (ANCEF) IVPB 2g/100 mL premix     2 g 200 mL/hr over 30 Minutes Intravenous  Once 12/04/17 2241 12/05/17 1602       V. Leia Alf, M.D. [facility] Office: [phone] Pager:  [phone]

## 2017-12-09 NOTE — Progress Notes (Signed)
[place] for heparin gtt Indication: ischemia of leg   Allergies  Allergen Reactions  . Lodine [Etodolac] Hives and Itching  . Methotrexate Derivatives Other (See Comments)    Flu like symptoms  . Nitrofuran Derivatives Nausea And Vomiting and Other (See Comments)    Made sicker   . Norvasc [Amlodipine] Other (See Comments)    Unknown   . Pentasa [Mesalamine Er] Other (See Comments)    Unknown   . Red Yeast Rice Extract Other (See Comments)    Patient preference   . Vioxx [Rofecoxib] Other (See Comments)    Unknown   . Levaquin [Levofloxacin] Itching, Rash and Other (See Comments)    Muscle pain  . Sulfa Antibiotics Itching and Rash    Patient Measurements: Height: 5' 4"  (162.6 cm) Weight: 122 lb (55.3 kg) IBW/kg (Calculated) : 54.7 Heparin Dosing Weight: 55.3kg  Vital Signs: Temp: 98.7 F (37.1 C) (05/26 2055) Temp Source: Oral (05/26 2055) BP: 109/67 (05/26 2055) Pulse Rate: 81 (05/26 2055)  Labs: Recent Labs    12/07/17 1836 12/08/17 0224  12/09/17 0434 12/09/17 1356 12/09/17 2257  HGB  --  8.7*  --   --   --   --   HCT  --  26.2*  --   --   --   --   PLT  --  209  --   --   --   --   APTT 35 45*  --   --   --   --   LABPROT 14.8  --   --   --   --   --   INR 1.17  --   --   --   --   --   HEPARINUNFRC  --  0.68   < > 0.15* 0.18* 0.41   < > = values in this interval not displayed.    Estimated Creatinine Clearance: 51.7 mL/min (by C-G formula based on SCr of 0.6 mg/dL).   Medical History: Past Medical History:  Diagnosis Date  . Anemia   . Anxiety   . Arthritis    rheumatoid  . Ataxia   . COPD (chronic obstructive pulmonary disease) ([place])   . Depression   . Diverticulosis   . Excessive falling   . GERD (gastroesophageal reflux disease)   . Hyperlipidemia   . Hypertension   . IBS (irritable bowel syndrome)   . Osteoarthritis   . Psoriasis   . Raynaud's disease without gangrene   . Raynaud's disease  without gangrene   . Shortness of breath dyspnea    with exertion    Medications:  Medications Prior to Admission  Medication Sig Dispense Refill Last Dose  . acetaminophen (TYLENOL) 500 MG tablet Take 1,000 mg by mouth every 8 (eight) hours as needed for mild pain or headache.   Past Month at Unknown time  . aspirin EC 81 MG tablet Take 1 tablet (81 mg total) by mouth daily.   12/04/2017 at Unknown time  . budesonide (ENTOCORT EC) 3 MG 24 hr capsule Take 3-6 mg by mouth See admin instructions. Pt takes 3 mg one day, 6 mg the next alternating doses   12/05/2017 at Unknown time  . Calcium Carbonate-Vitamin D (CALCIUM 600+D) 600-400 MG-UNIT tablet Take 1 tablet by mouth 2 (two) times daily.   12/04/2017 at Unknown time  . clopidogrel (PLAVIX) 75 MG tablet Take 1 tablet (75 mg total) by mouth daily. 30 tablet 4 12/04/2017 at  Unknown time  . Cranberry 500 MG CAPS Take 500 mg by mouth daily.   12/05/2017 at Unknown time  . ferrous sulfate 325 (65 FE) MG tablet Take 325 mg by mouth daily with breakfast.   12/04/2017 at Unknown time  . gemfibrozil (LOPID) 600 MG tablet Take 600 mg by mouth 2 (two) times daily before a meal.   12/05/2017 at Unknown time  . hydroxychloroquine (PLAQUENIL) 200 MG tablet Take 200 mg by mouth 2 (two) times daily.    12/05/2017 at Unknown time  . leflunomide (ARAVA) 20 MG tablet Take 20 mg by mouth daily.   12/05/2017 at Unknown time  . lisinopril (PRINIVIL,ZESTRIL) 10 MG tablet Take 10 mg by mouth daily.   12/05/2017 at Unknown time  . LORazepam (ATIVAN) 1 MG tablet Take 1 tablet (1 mg total) by mouth 2 (two) times daily as needed for anxiety. 10 tablet 0 Past Week at Unknown time  . Multiple Vitamin (MULTIVITAMIN) tablet Take 1 tablet by mouth daily.   12/04/2017 at Unknown time  . Multiple Vitamins-Minerals (PRESERVISION AREDS) CAPS Take 1 capsule by mouth 2 (two) times daily.   12/04/2017 at Unknown time  . omeprazole (PRILOSEC) 20 MG capsule Take 20 mg by mouth 2 (two) times daily  before a meal.    12/05/2017 at Unknown time  . oxyCODONE-acetaminophen (PERCOCET/ROXICET) 5-325 MG tablet One To Two Tabs By Mouth Every Six Hours As Needed For Pain 21 tablet 0 12/05/2017 at Unknown time  . pravastatin (PRAVACHOL) 80 MG tablet Take 80 mg by mouth at bedtime.    12/04/2017 at Unknown time  . predniSONE (DELTASONE) 5 MG tablet Take 5 mg by mouth daily with breakfast.   12/05/2017 at Unknown time  . sertraline (ZOLOFT) 50 MG tablet Take 50 mg by mouth at bedtime.    12/04/2017 at Unknown time  . apixaban (ELIQUIS) 5 MG TABS tablet Take 1 tablet (5 mg total) by mouth 2 (two) times daily. 60 tablet  12/01/2017  . cephALEXin (KEFLEX) 500 MG capsule Take 500 mg by mouth 3 (three) times daily. For 10 days - starting 11/21/2017   Not Taking at Unknown time  . diltiazem (CARDIZEM CD) 240 MG 24 hr capsule Take 240 mg by mouth daily.   Not Taking at Unknown time  . estradiol (ESTRACE) 0.1 MG/GM vaginal cream Place 1 g vaginally 3 (three) times a week. (Patient not taking: Reported on 11/27/2017) 42.5 g 12 Not Taking at Unknown time  . gabapentin (NEURONTIN) 300 MG capsule Take 1 capsule (300 mg total) by mouth daily. 30 capsule 0   . tiZANidine (ZANAFLEX) 4 MG tablet Take 4 mg by mouth daily as needed for muscle spasms (neck pain).    Not Taking at Unknown time   Scheduled:  . aspirin EC  81 mg Oral Daily  . bacitracin   Topical Daily  . clopidogrel  75 mg Oral Daily  . diltiazem  240 mg Oral Daily  . ferrous sulfate  325 mg Oral Q breakfast  . gabapentin  300 mg Oral Daily  . gemfibrozil  600 mg Oral BID AC  . hydroxychloroquine  200 mg Oral BID  . leflunomide  20 mg Oral Daily  . lisinopril  10 mg Oral Daily  . LORazepam  0.5 mg Oral BID  . multivitamin with minerals  1 tablet Oral Daily  . pantoprazole  40 mg Oral Daily  . pravastatin  80 mg Oral QHS  . predniSONE  5 mg Oral Q  breakfast  . sertraline  50 mg Oral QHS  . sodium chloride flush  3 mL Intravenous Q12H   Infusions:  .  sodium chloride    . heparin 1,000 Units/hr (12/09/17 1842)   PRN: sodium chloride, acetaminophen, acetaminophen, hydrALAZINE, HYDROmorphone (DILAUDID) injection, labetalol, ondansetron (ZOFRAN) IV, oxyCODONE, sodium chloride flush, tiZANidine Anti-infectives (From admission, onward)   Start     Dose/Rate Route Frequency Ordered Stop   12/05/17 2200  hydroxychloroquine (PLAQUENIL) tablet 200 mg     200 mg Oral 2 times daily 12/05/17 1805     12/04/17 2245  ceFAZolin (ANCEF) IVPB 2g/100 mL premix     2 g 200 mL/hr over 30 Minutes Intravenous  Once 12/04/17 2241 12/05/17 1602      Assessment: 77 year old female was on eliquis 62m po bid, now Dr SDelana Meyerand KHerbert Moorswould like heparin gtt despite Hgb 9.0. Discontinuing apixaban and starting heparin gtt at the direction of [place] we discussed hgb 9.0 via phone. Would like to proceed no bolus for heparin.   Goal of Therapy:  Heparin level 0.3-0.7 units/ml aPTT 66-102 seconds Monitor platelets by anticoagulation protocol: Yes   Plan:  05/26 @ 2300 HL 0.41 therapeutic. Will continue current rate and will recheck @ 0700. Will f/u on CBC w/ am labs.  DTobie Lords PharmD, BCPS Clinical Pharmacist 12/09/2017

## 2017-12-09 NOTE — Progress Notes (Signed)
[place] for heparin gtt Indication: ischemia of leg   Allergies  Allergen Reactions  . Lodine [Etodolac] Hives and Itching  . Methotrexate Derivatives Other (See Comments)    Flu like symptoms  . Nitrofuran Derivatives Nausea And Vomiting and Other (See Comments)    Made sicker   . Norvasc [Amlodipine] Other (See Comments)    Unknown   . Pentasa [Mesalamine Er] Other (See Comments)    Unknown   . Red Yeast Rice Extract Other (See Comments)    Patient preference   . Vioxx [Rofecoxib] Other (See Comments)    Unknown   . Levaquin [Levofloxacin] Itching, Rash and Other (See Comments)    Muscle pain  . Sulfa Antibiotics Itching and Rash    Patient Measurements: Height: 5' 4"  (162.6 cm) Weight: 122 lb (55.3 kg) IBW/kg (Calculated) : 54.7 Heparin Dosing Weight: 55.3kg  Vital Signs: Temp: 98 F (36.7 C) (05/26 1356) Temp Source: Oral (05/26 1356) BP: 130/63 (05/26 1356) Pulse Rate: 77 (05/26 1356)  Labs: Recent Labs    12/07/17 1836  12/08/17 0224 12/08/17 0953 12/09/17 0434 12/09/17 1356  HGB  --   --  8.7*  --   --   --   HCT  --   --  26.2*  --   --   --   PLT  --   --  209  --   --   --   APTT 35  --  45*  --   --   --   LABPROT 14.8  --   --   --   --   --   INR 1.17  --   --   --   --   --   HEPARINUNFRC  --    < > 0.68 0.36 0.15* 0.18*   < > = values in this interval not displayed.    Estimated Creatinine Clearance: 51.7 mL/min (by C-G formula based on SCr of 0.6 mg/dL).   Medical History: Past Medical History:  Diagnosis Date  . Anemia   . Anxiety   . Arthritis    rheumatoid  . Ataxia   . COPD (chronic obstructive pulmonary disease) ([place])   . Depression   . Diverticulosis   . Excessive falling   . GERD (gastroesophageal reflux disease)   . Hyperlipidemia   . Hypertension   . IBS (irritable bowel syndrome)   . Osteoarthritis   . Psoriasis   . Raynaud's disease without gangrene   . Raynaud's disease  without gangrene   . Shortness of breath dyspnea    with exertion    Medications:  Medications Prior to Admission  Medication Sig Dispense Refill Last Dose  . acetaminophen (TYLENOL) 500 MG tablet Take 1,000 mg by mouth every 8 (eight) hours as needed for mild pain or headache.   Past Month at Unknown time  . aspirin EC 81 MG tablet Take 1 tablet (81 mg total) by mouth daily.   12/04/2017 at Unknown time  . budesonide (ENTOCORT EC) 3 MG 24 hr capsule Take 3-6 mg by mouth See admin instructions. Pt takes 3 mg one day, 6 mg the next alternating doses   12/05/2017 at Unknown time  . Calcium Carbonate-Vitamin D (CALCIUM 600+D) 600-400 MG-UNIT tablet Take 1 tablet by mouth 2 (two) times daily.   12/04/2017 at Unknown time  . clopidogrel (PLAVIX) 75 MG tablet Take 1 tablet (75 mg total) by mouth daily. 30 tablet 4 12/04/2017 at  Unknown time  . Cranberry 500 MG CAPS Take 500 mg by mouth daily.   12/05/2017 at Unknown time  . ferrous sulfate 325 (65 FE) MG tablet Take 325 mg by mouth daily with breakfast.   12/04/2017 at Unknown time  . gemfibrozil (LOPID) 600 MG tablet Take 600 mg by mouth 2 (two) times daily before a meal.   12/05/2017 at Unknown time  . hydroxychloroquine (PLAQUENIL) 200 MG tablet Take 200 mg by mouth 2 (two) times daily.    12/05/2017 at Unknown time  . leflunomide (ARAVA) 20 MG tablet Take 20 mg by mouth daily.   12/05/2017 at Unknown time  . lisinopril (PRINIVIL,ZESTRIL) 10 MG tablet Take 10 mg by mouth daily.   12/05/2017 at Unknown time  . LORazepam (ATIVAN) 1 MG tablet Take 1 tablet (1 mg total) by mouth 2 (two) times daily as needed for anxiety. 10 tablet 0 Past Week at Unknown time  . Multiple Vitamin (MULTIVITAMIN) tablet Take 1 tablet by mouth daily.   12/04/2017 at Unknown time  . Multiple Vitamins-Minerals (PRESERVISION AREDS) CAPS Take 1 capsule by mouth 2 (two) times daily.   12/04/2017 at Unknown time  . omeprazole (PRILOSEC) 20 MG capsule Take 20 mg by mouth 2 (two) times daily  before a meal.    12/05/2017 at Unknown time  . oxyCODONE-acetaminophen (PERCOCET/ROXICET) 5-325 MG tablet One To Two Tabs By Mouth Every Six Hours As Needed For Pain 21 tablet 0 12/05/2017 at Unknown time  . pravastatin (PRAVACHOL) 80 MG tablet Take 80 mg by mouth at bedtime.    12/04/2017 at Unknown time  . predniSONE (DELTASONE) 5 MG tablet Take 5 mg by mouth daily with breakfast.   12/05/2017 at Unknown time  . sertraline (ZOLOFT) 50 MG tablet Take 50 mg by mouth at bedtime.    12/04/2017 at Unknown time  . apixaban (ELIQUIS) 5 MG TABS tablet Take 1 tablet (5 mg total) by mouth 2 (two) times daily. 60 tablet  12/01/2017  . cephALEXin (KEFLEX) 500 MG capsule Take 500 mg by mouth 3 (three) times daily. For 10 days - starting 11/21/2017   Not Taking at Unknown time  . diltiazem (CARDIZEM CD) 240 MG 24 hr capsule Take 240 mg by mouth daily.   Not Taking at Unknown time  . estradiol (ESTRACE) 0.1 MG/GM vaginal cream Place 1 g vaginally 3 (three) times a week. (Patient not taking: Reported on 11/27/2017) 42.5 g 12 Not Taking at Unknown time  . gabapentin (NEURONTIN) 300 MG capsule Take 1 capsule (300 mg total) by mouth daily. 30 capsule 0   . tiZANidine (ZANAFLEX) 4 MG tablet Take 4 mg by mouth daily as needed for muscle spasms (neck pain).    Not Taking at Unknown time   Scheduled:  . aspirin EC  81 mg Oral Daily  . bacitracin   Topical Daily  . clopidogrel  75 mg Oral Daily  . diltiazem  240 mg Oral Daily  . ferrous sulfate  325 mg Oral Q breakfast  . gabapentin  300 mg Oral Daily  . gemfibrozil  600 mg Oral BID AC  . heparin  1,600 Units Intravenous Once  . hydroxychloroquine  200 mg Oral BID  . leflunomide  20 mg Oral Daily  . lisinopril  10 mg Oral Daily  . LORazepam  0.5 mg Oral BID  . multivitamin with minerals  1 tablet Oral Daily  . pantoprazole  40 mg Oral Daily  . pravastatin  80 mg Oral QHS  .  predniSONE  5 mg Oral Q breakfast  . sertraline  50 mg Oral QHS  . sodium chloride flush  3  mL Intravenous Q12H   Infusions:  . sodium chloride    . heparin 800 Units/hr (12/09/17 0557)   PRN: sodium chloride, acetaminophen, acetaminophen, hydrALAZINE, HYDROmorphone (DILAUDID) injection, labetalol, ondansetron (ZOFRAN) IV, oxyCODONE, sodium chloride flush, tiZANidine Anti-infectives (From admission, onward)   Start     Dose/Rate Route Frequency Ordered Stop   12/05/17 2200  hydroxychloroquine (PLAQUENIL) tablet 200 mg     200 mg Oral 2 times daily 12/05/17 1805     12/04/17 2245  ceFAZolin (ANCEF) IVPB 2g/100 mL premix     2 g 200 mL/hr over 30 Minutes Intravenous  Once 12/04/17 2241 12/05/17 1602      Assessment: 77 year old female was on eliquis 66m po bid, now Dr SDelana Meyerand KHerbert Moorswould like heparin gtt despite Hgb 9.0. Discontinuing apixaban and starting heparin gtt at the direction of [place] we discussed hgb 9.0 via phone. Would like to proceed no bolus for heparin.   Goal of Therapy:  Heparin level 0.3-0.7 units/ml aPTT 66-102 seconds Monitor platelets by anticoagulation protocol: Yes   Plan:  05/26 at 13:56  HL = 0.18.  Ordered a bolus of heparin 1600 units. Increased drip rate to 1000 units/hr.    Recheck HL tonight at 2300.   Will recheck CBC w/ am labs.  KOlivia Canter RLavaca Medical CenterClinical Pharmacist 12/09/2017, 14:59 PM

## 2017-12-10 LAB — CBC
HEMATOCRIT: 26.5 % — AB (ref 35.0–47.0)
Hemoglobin: 9 g/dL — ABNORMAL LOW (ref 12.0–16.0)
MCH: 31.6 pg (ref 26.0–34.0)
MCHC: 33.8 g/dL (ref 32.0–36.0)
MCV: 93.5 fL (ref 80.0–100.0)
PLATELETS: 261 10*3/uL (ref 150–440)
RBC: 2.83 MIL/uL — ABNORMAL LOW (ref 3.80–5.20)
RDW: 14.6 % — ABNORMAL HIGH (ref 11.5–14.5)
WBC: 14.8 10*3/uL — AB (ref 3.6–11.0)

## 2017-12-10 LAB — BASIC METABOLIC PANEL
ANION GAP: 9 (ref 5–15)
BUN: 19 mg/dL (ref 6–20)
CALCIUM: 8.8 mg/dL — AB (ref 8.9–10.3)
CO2: 27 mmol/L (ref 22–32)
CREATININE: 0.64 mg/dL (ref 0.44–1.00)
Chloride: 96 mmol/L — ABNORMAL LOW (ref 101–111)
GFR calc Af Amer: >60 mL/min (ref >60)
GLUCOSE: 148 mg/dL — AB (ref 65–99)
Potassium: 4.2 mmol/L (ref 3.5–5.1)
Sodium: 132 mmol/L — ABNORMAL LOW (ref 135–145)

## 2017-12-10 LAB — HEPARIN LEVEL (UNFRACTIONATED)
Heparin Unfractionated: 0.34 IU/mL (ref 0.30–0.70)
Heparin Unfractionated: 0.32 IU/mL (ref 0.30–0.70)

## 2017-12-10 MED ORDER — BOOST / RESOURCE BREEZE PO LIQD CUSTOM
1.0000 | Freq: Three times a day (TID) | ORAL | Status: DC
  Administered 2017-12-10 – 2017-12-17 (×12): 1 via ORAL

## 2017-12-10 MED ORDER — LORAZEPAM 0.5 MG PO TABS
0.5000 mg | ORAL_TABLET | Freq: Once | ORAL | Status: AC
  Administered 2017-12-10: 0.5 mg via ORAL
  Filled 2017-12-10: qty 1

## 2017-12-10 MED ORDER — LORAZEPAM 1 MG PO TABS
1.0000 mg | ORAL_TABLET | Freq: Two times a day (BID) | ORAL | Status: DC
  Administered 2017-12-10 – 2017-12-14 (×6): 1 mg via ORAL
  Filled 2017-12-10 (×7): qty 1

## 2017-12-10 NOTE — Progress Notes (Signed)
    Subjective  -   Still with left leg pain   Physical Exam:  Ischemic left foot       Assessment/Plan:    Increase ativan to 1 mg bid Add Boost TID for nutrition Discussed proceeding with amputation this week Daughter at bedside   [place] 12/10/2017 12:24 PM --  Vitals:   12/09/17 2055 12/10/17 0414  BP: 109/67 113/64  Pulse: 81 85  Resp: 16 16  Temp: 98.7 F (37.1 C) 98.7 F (37.1 C)  SpO2: 95% 94%    Intake/Output Summary (Last 24 hours) at 12/10/2017 1224 Last data filed at 12/10/2017 0900 Gross per 24 hour  Intake 403.4 ml  Output 900 ml  Net -496.6 ml     Laboratory CBC    Component Value Date/Time   WBC 14.8 (H) 12/10/2017 0654   HGB 9.0 (L) 12/10/2017 0654   HGB 11.6 (L) 06/16/2013 1031   HCT 26.5 (L) 12/10/2017 0654   HCT 34.8 (L) 06/16/2013 1031   PLT 261 12/10/2017 0654   PLT 226 06/16/2013 1031    BMET    Component Value Date/Time   NA 132 (L) 12/10/2017 0654   NA 139 06/16/2013 1031   K 4.2 12/10/2017 0654   K 3.8 06/16/2013 1031   CL 96 (L) 12/10/2017 0654   CL 105 06/16/2013 1031   CO2 27 12/10/2017 0654   CO2 30 06/16/2013 1031   GLUCOSE 148 (H) 12/10/2017 0654   GLUCOSE 96 06/16/2013 1031   BUN 19 12/10/2017 0654   BUN 23 (H) 06/16/2013 1031   CREATININE 0.64 12/10/2017 0654   CREATININE 0.58 (L) 06/16/2013 1031   CALCIUM 8.8 (L) 12/10/2017 0654   CALCIUM 9.7 06/16/2013 1031   GFRNONAA >60 12/10/2017 0654   GFRNONAA >60 06/16/2013 1031   GFRAA >60 12/10/2017 0654   GFRAA >60 06/16/2013 1031    COAG Lab Results  Component Value Date   INR 1.17 12/07/2017   INR 1.33 04/19/2017   INR 1.06 03/22/2016   No results found for: PTT  Antibiotics Anti-infectives (From admission, onward)   Start     Dose/Rate Route Frequency Ordered Stop   12/05/17 2200  hydroxychloroquine (PLAQUENIL) tablet 200 mg     200 mg Oral 2 times daily 12/05/17 1805     12/04/17 2245  ceFAZolin (ANCEF) IVPB 2g/100 mL premix     2  g 200 mL/hr over 30 Minutes Intravenous  Once 12/04/17 2241 12/05/17 1602       V. Leia Alf, M.D. [facility] Office: [phone] Pager:  [phone]

## 2017-12-10 NOTE — Progress Notes (Signed)
OT Cancellation Note  Patient Details Name: ITXEL WICKARD MRN: [id number] DOB: 11-01-1940   Cancelled Treatment:     OT treatment not completed this date, pt fast asleep barely awakening to knocks on door and conversation from therapist. Will continue to follow as available and appropriate.    Zenovia Jarred, MSOT, OTR/L  [place] 12/10/2017, 1:57 PM

## 2017-12-10 NOTE — Progress Notes (Signed)
[place] for heparin gtt Indication: ischemia of leg   Allergies  Allergen Reactions  . Lodine [Etodolac] Hives and Itching  . Methotrexate Derivatives Other (See Comments)    Flu like symptoms  . Nitrofuran Derivatives Nausea And Vomiting and Other (See Comments)    Made sicker   . Norvasc [Amlodipine] Other (See Comments)    Unknown   . Pentasa [Mesalamine Er] Other (See Comments)    Unknown   . Red Yeast Rice Extract Other (See Comments)    Patient preference   . Vioxx [Rofecoxib] Other (See Comments)    Unknown   . Levaquin [Levofloxacin] Itching, Rash and Other (See Comments)    Muscle pain  . Sulfa Antibiotics Itching and Rash    Patient Measurements: Height: 5' 4"  (162.6 cm) Weight: 122 lb (55.3 kg) IBW/kg (Calculated) : 54.7 Heparin Dosing Weight: 55.3kg  Vital Signs: Temp: 98.7 F (37.1 C) (05/27 0414) Temp Source: Oral (05/27 0414) BP: 113/64 (05/27 0414) Pulse Rate: 85 (05/27 0414)  Labs: Recent Labs    12/07/17 1836 12/08/17 0224  12/09/17 1356 12/09/17 2257 12/10/17 0654  HGB  --  8.7*  --   --   --  9.0*  HCT  --  26.2*  --   --   --  26.5*  PLT  --  209  --   --   --  261  APTT 35 45*  --   --   --   --   LABPROT 14.8  --   --   --   --   --   INR 1.17  --   --   --   --   --   HEPARINUNFRC  --  0.68   < > 0.18* 0.41 0.34  CREATININE  --   --   --   --   --  0.64   < > = values in this interval not displayed.    Estimated Creatinine Clearance: 51.7 mL/min (by C-G formula based on SCr of 0.64 mg/dL).   Assessment: 77 year old female was on eliquis 49m po bid, now Dr SDelana Meyerand KHerbert Moorswould like heparin gtt despite Hgb 9.0. Discontinuing apixaban and starting heparin gtt at the direction of [place] we discussed hgb 9.0 via phone. Would like to proceed no bolus for heparin.   Goal of Therapy:  Heparin level 0.3-0.7 units/ml aPTT 66-102 seconds Monitor platelets by anticoagulation  protocol: Yes   Plan:  05/26 @ 2300 HL 0.41 therapeutic. Will continue current rate and will recheck @ 0700. Will f/u on CBC w/ am labs.  05/27 0654 HL 0.34 therapeutic. It appears previous level drawn 4.5 h after rate change. Will continue drip at current rate. Will check another HL to ensure pt stays therapeutic as heparin level is trending down to borderline. CBC in AM. Hgb stable, plt count improved.   Pharmacy will continue to follow.   HRayna Sexton PharmD, BCPS Clinical Pharmacist 12/10/2017 7:42 AM

## 2017-12-10 NOTE — Progress Notes (Signed)
[place] for heparin gtt Indication: ischemia of leg   Allergies  Allergen Reactions  . Lodine [Etodolac] Hives and Itching  . Methotrexate Derivatives Other (See Comments)    Flu like symptoms  . Nitrofuran Derivatives Nausea And Vomiting and Other (See Comments)    Made sicker   . Norvasc [Amlodipine] Other (See Comments)    Unknown   . Pentasa [Mesalamine Er] Other (See Comments)    Unknown   . Red Yeast Rice Extract Other (See Comments)    Patient preference   . Vioxx [Rofecoxib] Other (See Comments)    Unknown   . Levaquin [Levofloxacin] Itching, Rash and Other (See Comments)    Muscle pain  . Sulfa Antibiotics Itching and Rash    Patient Measurements: Height: 5' 4"  (162.6 cm) Weight: 122 lb (55.3 kg) IBW/kg (Calculated) : 54.7 Heparin Dosing Weight: 55.3kg  Vital Signs: Temp: 97.7 F (36.5 C) (05/27 1318) Temp Source: Oral (05/27 1318) BP: 122/59 (05/27 1318) Pulse Rate: 79 (05/27 1318)  Labs: Recent Labs    12/07/17 1836 12/08/17 0224  12/09/17 2257 12/10/17 0654 12/10/17 1619  HGB  --  8.7*  --   --  9.0*  --   HCT  --  26.2*  --   --  26.5*  --   PLT  --  209  --   --  261  --   APTT 35 45*  --   --   --   --   LABPROT 14.8  --   --   --   --   --   INR 1.17  --   --   --   --   --   HEPARINUNFRC  --  0.68   < > 0.41 0.34 0.32  CREATININE  --   --   --   --  0.64  --    < > = values in this interval not displayed.    Estimated Creatinine Clearance: 51.7 mL/min (by C-G formula based on SCr of 0.64 mg/dL).   Assessment: 77 year old female was on eliquis 79m po bid, now Dr SDelana Meyerand KHerbert Moorswould like heparin gtt despite Hgb 9.0. Discontinuing apixaban and starting heparin gtt at the direction of [place] we discussed hgb 9.0 via phone. Would like to proceed no bolus for heparin.   Goal of Therapy:  Heparin level 0.3-0.7 units/ml aPTT 66-102 seconds Monitor platelets by anticoagulation  protocol: Yes   Plan:  05/26 @ 2300 HL 0.41 therapeutic. Will continue current rate and will recheck @ 0700. Will f/u on CBC w/ am labs.  05/27 0654 HL 0.34 therapeutic. It appears previous level drawn 4.5 h after rate change. Will continue drip at current rate. Will check another HL to ensure pt stays therapeutic as heparin level is trending down to borderline. CBC in AM. Hgb stable, plt count improved.   5/27 16:19 HL therapeutic x 3. Continue current rate. Recheck HL/CBC daily per protocol.   Pharmacy will continue to follow.   Morrill Bomkamp A. CJordan Hawks PharmD, BCPS Clinical Pharmacist 12/10/2017 6:08 PM

## 2017-12-10 NOTE — Progress Notes (Signed)
PT Cancellation Note  Patient Details Name: Melissa [name] MRN: [id number] DOB: 1940/10/30   Cancelled Treatment:    Reason Eval/Treat Not Completed: Other (comment)   Chart reviewed.  Pt offered session this am.  "I've been better"  Declined session at this time and daughter in agreement.  Will continue as appropriate.   Chesley Noon 12/10/2017, 10:56 AM

## 2017-12-11 DIAGNOSIS — I70222 Atherosclerosis of native arteries of extremities with rest pain, left leg: Secondary | ICD-10-CM

## 2017-12-11 LAB — HEPARIN LEVEL (UNFRACTIONATED): HEPARIN UNFRACTIONATED: 0.34 [IU]/mL (ref 0.30–0.70)

## 2017-12-11 LAB — BASIC METABOLIC PANEL
ANION GAP: 11 (ref 5–15)
BUN: 18 mg/dL (ref 6–20)
CHLORIDE: 95 mmol/L — AB (ref 101–111)
CO2: 27 mmol/L (ref 22–32)
Calcium: 9.3 mg/dL (ref 8.9–10.3)
Creatinine, Ser: 0.69 mg/dL (ref 0.44–1.00)
GFR calc non Af Amer: >60 mL/min (ref >60)
Glucose, Bld: 127 mg/dL — ABNORMAL HIGH (ref 65–99)
POTASSIUM: 4.4 mmol/L (ref 3.5–5.1)
SODIUM: 133 mmol/L — AB (ref 135–145)

## 2017-12-11 LAB — CBC
HCT: 28.1 % — ABNORMAL LOW (ref 35.0–47.0)
HEMOGLOBIN: 9.4 g/dL — AB (ref 12.0–16.0)
MCH: 31.3 pg (ref 26.0–34.0)
MCHC: 33.4 g/dL (ref 32.0–36.0)
MCV: 93.7 fL (ref 80.0–100.0)
Platelets: 288 10*3/uL (ref 150–440)
RBC: 3 MIL/uL — AB (ref 3.80–5.20)
RDW: 14.4 % (ref 11.5–14.5)
WBC: 14.9 10*3/uL — AB (ref 3.6–11.0)

## 2017-12-11 MED ORDER — CEFAZOLIN SODIUM-DEXTROSE 2-4 GM/100ML-% IV SOLN
2.0000 g | INTRAVENOUS | Status: AC
  Administered 2017-12-12: 2 g via INTRAVENOUS
  Filled 2017-12-11: qty 100

## 2017-12-11 NOTE — Progress Notes (Addendum)
PT Cancellation Note  Patient Details Name: Melissa [name] MRN: [id number] DOB: 05/28/1941   Cancelled Treatment:    Reason Eval/Treat Not Completed: Other (comment). 3rd treatment attempted, however per RN pt is still experiencing severe pain and is unable to participate at this time. Pt pending further amputation. Due to pain and pt unable to participate will cancel current order at this time. Please re-order when pt able to participate.   Currie Dennin 12/11/2017, 1:37 PM Greggory Stallion, PT, DPT [phone]

## 2017-12-11 NOTE — Progress Notes (Signed)
[place] for heparin gtt Indication: ischemia of leg   Allergies  Allergen Reactions  . Lodine [Etodolac] Hives and Itching  . Methotrexate Derivatives Other (See Comments)    Flu like symptoms  . Nitrofuran Derivatives Nausea And Vomiting and Other (See Comments)    Made sicker   . Norvasc [Amlodipine] Other (See Comments)    Unknown   . Pentasa [Mesalamine Er] Other (See Comments)    Unknown   . Red Yeast Rice Extract Other (See Comments)    Patient preference   . Vioxx [Rofecoxib] Other (See Comments)    Unknown   . Levaquin [Levofloxacin] Itching, Rash and Other (See Comments)    Muscle pain  . Sulfa Antibiotics Itching and Rash    Patient Measurements: Height: 5' 4"  (162.6 cm) Weight: 122 lb (55.3 kg) IBW/kg (Calculated) : 54.7 Heparin Dosing Weight: 55.3kg  Vital Signs: Temp: 98.1 F (36.7 C) (05/28 0442) Temp Source: Oral (05/28 0442) BP: 116/74 (05/28 0442) Pulse Rate: 86 (05/28 0442)  Labs: Recent Labs    12/10/17 0654 12/10/17 1619 12/11/17 0437  HGB 9.0*  --  9.4*  HCT 26.5*  --  28.1*  PLT 261  --  288  HEPARINUNFRC 0.34 0.32 0.34  CREATININE 0.64  --   --     Estimated Creatinine Clearance: 51.7 mL/min (by C-G formula based on SCr of 0.64 mg/dL).   Assessment: 77 year old female was on eliquis 20m po bid, now Dr SDelana Meyerand KHerbert Moorswould like heparin gtt despite Hgb 9.0. Discontinuing apixaban and starting heparin gtt at the direction of [place] we discussed hgb 9.0 via phone. Would like to proceed no bolus for heparin.   Goal of Therapy:  Heparin level 0.3-0.7 units/ml aPTT 66-102 seconds Monitor platelets by anticoagulation protocol: Yes   Plan:  05/26 @ 2300 HL 0.41 therapeutic. Will continue current rate and will recheck @ 0700. Will f/u on CBC w/ am labs.  05/27 0654 HL 0.34 therapeutic. It appears previous level drawn 4.5 h after rate change. Will continue drip at current rate. Will  check another HL to ensure pt stays therapeutic as heparin level is trending down to borderline. CBC in AM. Hgb stable, plt count improved.   5/27 16:19 HL therapeutic x 3. Continue current rate. Recheck HL/CBC daily per protocol.   5/28 AM heparin level 0.34. Continue current regimen. Recheck heparin level and CBC with tomorrow AM labs.  Pharmacy will continue to follow.   MSim Boast PharmD, BCPS  12/11/17 5:52 AM

## 2017-12-11 NOTE — Progress Notes (Signed)
[facility]  Daily Progress Note   Subjective  - 6 Days Post-Op  The patient continues to have excruciating pain of the left foot  Objective Vitals:   12/10/17 2011 12/11/17 0442 12/11/17 1204 12/11/17 2014  BP: 129/77 116/74 125/67 114/62  Pulse: 97 86 82 80  Resp: 16 16 16 18   Temp: 98.8 F (37.1 C) 98.1 F (36.7 C) 98.7 F (37.1 C) 99.3 F (37.4 C)  TempSrc: Oral Oral Oral Oral  SpO2: 93% 94% 93% 98%  Weight:      Height:        Intake/Output Summary (Last 24 hours) at 12/11/2017 2211 Last data filed at 12/11/2017 1918 Gross per 24 hour  Intake 319 ml  Output 1200 ml  Net -881 ml    PULM  Normal effort , no use of accessory muscles CV  No JVD, RRR Abd      No distended, nontender VASC  the left forefoot is now mottled with fixed changes it is cold; pulses are nonpalpable  Laboratory CBC    Component Value Date/Time   WBC 14.9 (H) 12/11/2017 0437   HGB 9.4 (L) 12/11/2017 0437   HGB 11.6 (L) 06/16/2013 1031   HCT 28.1 (L) 12/11/2017 0437   HCT 34.8 (L) 06/16/2013 1031   PLT 288 12/11/2017 0437   PLT 226 06/16/2013 1031    BMET    Component Value Date/Time   NA 133 (L) 12/11/2017 0437   NA 139 06/16/2013 1031   K 4.4 12/11/2017 0437   K 3.8 06/16/2013 1031   CL 95 (L) 12/11/2017 0437   CL 105 06/16/2013 1031   CO2 27 12/11/2017 0437   CO2 30 06/16/2013 1031   GLUCOSE 127 (H) 12/11/2017 0437   GLUCOSE 96 06/16/2013 1031   BUN 18 12/11/2017 0437   BUN 23 (H) 06/16/2013 1031   CREATININE 0.69 12/11/2017 0437   CREATININE 0.58 (L) 06/16/2013 1031   CALCIUM 9.3 12/11/2017 0437   CALCIUM 9.7 06/16/2013 1031   GFRNONAA >60 12/11/2017 0437   GFRNONAA >60 06/16/2013 1031   GFRAA >60 12/11/2017 0437   GFRAA >60 06/16/2013 1031    Assessment/Planning:   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  The  risks and benefits of been reviewed all questions have been answered the patient agrees to proceed with amputation.  She was scheduled for left below the knee amputation tomorrow.    [name]  12/11/2017, 10:11 PM

## 2017-12-12 ENCOUNTER — Inpatient Hospital Stay: Payer: Medicare HMO | Admitting: Certified Registered"

## 2017-12-12 ENCOUNTER — Encounter
Admission: RE | Disposition: A | Discharge status: Skilled Nursing Facility | Payer: Self-pay | Source: Ambulatory Visit | Attending: Vascular Surgery

## 2017-12-12 ENCOUNTER — Encounter: Payer: Self-pay | Admitting: Anesthesiology

## 2017-12-12 DIAGNOSIS — I70262 Atherosclerosis of native arteries of extremities with gangrene, left leg: Secondary | ICD-10-CM

## 2017-12-12 HISTORY — PX: AMPUTATION: SHX166

## 2017-12-12 LAB — CBC
HEMATOCRIT: 27.2 % — AB (ref 35.0–47.0)
HEMOGLOBIN: 9.1 g/dL — AB (ref 12.0–16.0)
MCH: 31.1 pg (ref 26.0–34.0)
MCHC: 33.3 g/dL (ref 32.0–36.0)
MCV: 93.2 fL (ref 80.0–100.0)
Platelets: 307 10*3/uL (ref 150–440)
RBC: 2.91 MIL/uL — AB (ref 3.80–5.20)
RDW: 14.8 % — ABNORMAL HIGH (ref 11.5–14.5)
WBC: 18.4 10*3/uL — AB (ref 3.6–11.0)

## 2017-12-12 LAB — TYPE AND SCREEN
ABO/RH(D): A POS
ANTIBODY SCREEN: NEGATIVE

## 2017-12-12 LAB — HEPARIN LEVEL (UNFRACTIONATED): Heparin Unfractionated: 0.3 IU/mL (ref 0.30–0.70)

## 2017-12-12 LAB — GLUCOSE, CAPILLARY: GLUCOSE-CAPILLARY: 155 mg/dL — AB (ref 65–99)

## 2017-12-12 SURGERY — AMPUTATION BELOW KNEE
Anesthesia: General | Laterality: Left

## 2017-12-12 MED ORDER — OXYCODONE HCL 5 MG PO TABS: 5.0000 mg | ORAL_TABLET | Freq: Once | ORAL | Status: DC | PRN

## 2017-12-12 MED ORDER — KETAMINE HCL 50 MG/ML IJ SOLN
INTRAMUSCULAR | Status: DC | PRN
  Administered 2017-12-12 (×2): 25 mg via INTRAVENOUS

## 2017-12-12 MED ORDER — MIDAZOLAM HCL 2 MG/2ML IJ SOLN
INTRAMUSCULAR | Status: AC
  Filled 2017-12-12: qty 2

## 2017-12-12 MED ORDER — ONDANSETRON HCL 4 MG/2ML IJ SOLN
INTRAMUSCULAR | Status: DC | PRN
  Administered 2017-12-12: 4 mg via INTRAVENOUS

## 2017-12-12 MED ORDER — PHENYLEPHRINE HCL 10 MG/ML IJ SOLN
INTRAMUSCULAR | Status: DC | PRN
  Administered 2017-12-12 (×2): 100 ug via INTRAVENOUS

## 2017-12-12 MED ORDER — CEFAZOLIN SODIUM 1 G IJ SOLR
INTRAMUSCULAR | Status: AC
  Filled 2017-12-12: qty 20

## 2017-12-12 MED ORDER — LIDOCAINE HCL (CARDIAC) PF 100 MG/5ML IV SOSY
PREFILLED_SYRINGE | INTRAVENOUS | Status: DC | PRN
  Administered 2017-12-12: 50 mg via INTRAVENOUS

## 2017-12-12 MED ORDER — KETOROLAC TROMETHAMINE 30 MG/ML IJ SOLN
30.0000 mg | Freq: Three times a day (TID) | INTRAMUSCULAR | Status: DC
  Administered 2017-12-12 – 2017-12-15 (×8): 30 mg via INTRAVENOUS
  Filled 2017-12-12 (×7): qty 1

## 2017-12-12 MED ORDER — FENTANYL CITRATE (PF) 100 MCG/2ML IJ SOLN: 25.0000 ug | INTRAMUSCULAR | Status: DC | PRN

## 2017-12-12 MED ORDER — LACTATED RINGERS IV SOLN: INTRAVENOUS | Status: DC

## 2017-12-12 MED ORDER — ACETAMINOPHEN 10 MG/ML IV SOLN
1000.0000 mg | Freq: Four times a day (QID) | INTRAVENOUS | Status: AC
  Administered 2017-12-12 – 2017-12-13 (×3): 1000 mg via INTRAVENOUS
  Filled 2017-12-12 (×5): qty 100

## 2017-12-12 MED ORDER — PROPOFOL 10 MG/ML IV BOLUS
INTRAVENOUS | Status: DC | PRN
  Administered 2017-12-12: 20 mg via INTRAVENOUS
  Administered 2017-12-12: 90 mg via INTRAVENOUS

## 2017-12-12 MED ORDER — OXYCODONE HCL 5 MG/5ML PO SOLN: 5.0000 mg | Freq: Once | ORAL | Status: DC | PRN

## 2017-12-12 MED ORDER — DEXAMETHASONE SODIUM PHOSPHATE 10 MG/ML IJ SOLN
INTRAMUSCULAR | Status: DC | PRN
  Administered 2017-12-12: 5 mg via INTRAVENOUS

## 2017-12-12 MED ORDER — SUCCINYLCHOLINE CHLORIDE 20 MG/ML IJ SOLN
INTRAMUSCULAR | Status: DC | PRN
  Administered 2017-12-12: 80 mg via INTRAVENOUS

## 2017-12-12 MED ORDER — FENTANYL CITRATE (PF) 100 MCG/2ML IJ SOLN
INTRAMUSCULAR | Status: DC | PRN
  Administered 2017-12-12: 25 ug via INTRAVENOUS
  Administered 2017-12-12 (×2): 50 ug via INTRAVENOUS
  Administered 2017-12-12: 25 ug via INTRAVENOUS
  Administered 2017-12-12: 50 ug via INTRAVENOUS

## 2017-12-12 MED ORDER — KETAMINE HCL 50 MG/ML IJ SOLN
INTRAMUSCULAR | Status: AC
  Filled 2017-12-12: qty 10

## 2017-12-12 MED ORDER — PROPOFOL 10 MG/ML IV BOLUS
INTRAVENOUS | Status: AC
  Filled 2017-12-12: qty 20

## 2017-12-12 MED ORDER — FENTANYL CITRATE (PF) 250 MCG/5ML IJ SOLN
INTRAMUSCULAR | Status: AC
  Filled 2017-12-12: qty 5

## 2017-12-12 MED ORDER — LACTATED RINGERS IV SOLN
INTRAVENOUS | Status: DC | PRN
  Administered 2017-12-12: 14:00:00 via INTRAVENOUS

## 2017-12-12 SURGICAL SUPPLY — 51 items
BAG COUNTER SPONGE EZ (MISCELLANEOUS) IMPLANT
BANDAGE ELASTIC 4 LF NS (GAUZE/BANDAGES/DRESSINGS) ×3 IMPLANT
BLADE SAGITTAL WIDE XTHICK NO (BLADE) ×3 IMPLANT
BLADE SAW GIGLI 510 (BLADE) ×2 IMPLANT
BLADE SAW GIGLI 510MM (BLADE) ×1
BLADE SURG SZ10 CARB STEEL (BLADE) ×3 IMPLANT
BNDG COHESIVE 4X5 TAN STRL (GAUZE/BANDAGES/DRESSINGS) ×3 IMPLANT
BNDG GAUZE 4.5X4.1 6PLY STRL (MISCELLANEOUS) ×3 IMPLANT
BRUSH SCRUB EZ  4% CHG (MISCELLANEOUS) ×2
BRUSH SCRUB EZ 4% CHG (MISCELLANEOUS) ×1 IMPLANT
CANISTER SUCT 1200ML W/VALVE (MISCELLANEOUS) ×3 IMPLANT
CHLORAPREP W/TINT 26ML (MISCELLANEOUS) ×3 IMPLANT
COUNTER SPONGE BAG EZ (MISCELLANEOUS)
DERMABOND ADVANCED (GAUZE/BANDAGES/DRESSINGS) ×2
DERMABOND ADVANCED .7 DNX12 (GAUZE/BANDAGES/DRESSINGS) ×1 IMPLANT
DRAPE INCISE IOBAN 66X45 STRL (DRAPES) ×3 IMPLANT
DRSG GAUZE FLUFF 36X18 (GAUZE/BANDAGES/DRESSINGS) ×3 IMPLANT
ELECT CAUTERY BLADE 6.4 (BLADE) ×3 IMPLANT
ELECT REM PT RETURN 9FT ADLT (ELECTROSURGICAL) ×3
ELECTRODE REM PT RTRN 9FT ADLT (ELECTROSURGICAL) ×1 IMPLANT
GAUZE PETRO XEROFOAM 1X8 (MISCELLANEOUS) IMPLANT
GLOVE BIO SURGEON STRL SZ7 (GLOVE) ×3 IMPLANT
GLOVE INDICATOR 7.5 STRL GRN (GLOVE) ×3 IMPLANT
GLOVE SURG SYN 8.0 (GLOVE) ×3 IMPLANT
GOWN STRL REUS W/ TWL LRG LVL3 (GOWN DISPOSABLE) ×2 IMPLANT
GOWN STRL REUS W/ TWL XL LVL3 (GOWN DISPOSABLE) ×1 IMPLANT
GOWN STRL REUS W/TWL LRG LVL3 (GOWN DISPOSABLE) ×4
GOWN STRL REUS W/TWL XL LVL3 (GOWN DISPOSABLE) ×2
HANDLE YANKAUER SUCT BULB TIP (MISCELLANEOUS) IMPLANT
KIT TURNOVER KIT A (KITS) ×3 IMPLANT
LABEL OR SOLS (LABEL) ×3 IMPLANT
NS IRRIG 500ML POUR BTL (IV SOLUTION) ×3 IMPLANT
PACK EXTREMITY ARMC (MISCELLANEOUS) ×3 IMPLANT
PAD ABD DERMACEA PRESS 5X9 (GAUZE/BANDAGES/DRESSINGS) ×3 IMPLANT
PAD PREP 24X41 OB/GYN DISP (PERSONAL CARE ITEMS) ×3 IMPLANT
SPONGE LAP 18X18 RF (DISPOSABLE) ×6 IMPLANT
STAPLER SKIN PROX 35W (STAPLE) ×3 IMPLANT
STOCKINETTE M/LG 89821 (MISCELLANEOUS) ×3 IMPLANT
SUT MNCRL 4-0 (SUTURE) ×2
SUT MNCRL 4-0 27XMFL (SUTURE) ×1
SUT SILK 2 0 (SUTURE) ×2
SUT SILK 2-0 18XBRD TIE 12 (SUTURE) ×1 IMPLANT
SUT SILK 3 0 (SUTURE) ×2
SUT SILK 3-0 18XBRD TIE 12 (SUTURE) ×1 IMPLANT
SUT VIC AB 0 CT1 36 (SUTURE) ×12 IMPLANT
SUT VIC AB 3-0 SH 27 (SUTURE) ×4
SUT VIC AB 3-0 SH 27X BRD (SUTURE) ×2 IMPLANT
SUT VICRYL PLUS ABS 0 54 (SUTURE) ×3 IMPLANT
SUTURE MNCRL 4-0 27XMF (SUTURE) ×1 IMPLANT
TAPE UMBIL 1/8X18 RADIOPA (MISCELLANEOUS) ×3 IMPLANT
TOWEL OR 17X26 4PK STRL BLUE (TOWEL DISPOSABLE) ×6 IMPLANT

## 2017-12-12 NOTE — Anesthesia Post-op Follow-up Note (Signed)
Anesthesia QCDR form completed.        

## 2017-12-12 NOTE — Progress Notes (Signed)
OT Cancellation Note  Patient Details Name: Melissa [name] MRN: [id number] DOB: 09-18-1940   Cancelled Treatment:    Reason Eval/Treat Not Completed: Medical issues which prohibited therapy Per RN pt is still experiencing severe pain and is unable to participate at this time. Pt pending further amputation. Due to pain and pt unable to participate will cancel current order at this time. Please re-order when pt able to participate.    Chrys Racer, OTR/L ascom [phone] 12/12/17, 8:50 AM

## 2017-12-12 NOTE — Progress Notes (Signed)
[place] for heparin gtt Indication: ischemia of leg   Allergies  Allergen Reactions  . Lodine [Etodolac] Hives and Itching  . Methotrexate Derivatives Other (See Comments)    Flu like symptoms  . Nitrofuran Derivatives Nausea And Vomiting and Other (See Comments)    Made sicker   . Norvasc [Amlodipine] Other (See Comments)    Unknown   . Pentasa [Mesalamine Er] Other (See Comments)    Unknown   . Red Yeast Rice Extract Other (See Comments)    Patient preference   . Vioxx [Rofecoxib] Other (See Comments)    Unknown   . Levaquin [Levofloxacin] Itching, Rash and Other (See Comments)    Muscle pain  . Sulfa Antibiotics Itching and Rash    Patient Measurements: Height: 5' 4"  (162.6 cm) Weight: 122 lb (55.3 kg) IBW/kg (Calculated) : 54.7 Heparin Dosing Weight: 55.3kg  Vital Signs: Temp: 99 F (37.2 C) (05/29 0521) Temp Source: Oral (05/29 0521) BP: 130/68 (05/29 0521) Pulse Rate: 75 (05/29 0521)  Labs: Recent Labs    12/10/17 0654 12/10/17 1619 12/11/17 0437 12/12/17 0415  HGB 9.0*  --  9.4* 9.1*  HCT 26.5*  --  28.1* 27.2*  PLT 261  --  288 307  HEPARINUNFRC 0.34 0.32 0.34 0.30  CREATININE 0.64  --  0.69  --     Estimated Creatinine Clearance: 51.7 mL/min (by C-G formula based on SCr of 0.69 mg/dL).   Assessment: 77 year old female was on eliquis 63m po bid, now Dr SDelana Meyerand KHerbert Moorswould like heparin gtt despite Hgb 9.0. Discontinuing apixaban and starting heparin gtt at the direction of [place] we discussed hgb 9.0 via phone. Would like to proceed no bolus for heparin.   Goal of Therapy:  Heparin level 0.3-0.7 units/ml aPTT 66-102 seconds Monitor platelets by anticoagulation protocol: Yes   Plan:  05/26 @ 2300 HL 0.41 therapeutic. Will continue current rate and will recheck @ 0700. Will f/u on CBC w/ am labs.  05/27 0654 HL 0.34 therapeutic. It appears previous level drawn 4.5 h after rate change.  Will continue drip at current rate. Will check another HL to ensure pt stays therapeutic as heparin level is trending down to borderline. CBC in AM. Hgb stable, plt count improved.   5/27 16:19 HL therapeutic x 3. Continue current rate. Recheck HL/CBC daily per protocol.   5/28 AM heparin level 0.34. Continue current regimen. Recheck heparin level and CBC with tomorrow AM labs.  5/29 AM heparin level 0.30. Continue current regimen. Recheck heparin level and CBC with tomorrow AM labs.  Pharmacy will continue to follow.   MSim Boast PharmD, BCPS  12/12/17 6:13 AM

## 2017-12-12 NOTE — Anesthesia Procedure Notes (Addendum)
Procedure Name: Intubation Date/Time: 12/12/2017 2:16 PM Performed by: Rolla Plate, CRNA Pre-anesthesia Checklist: Patient identified, Emergency Drugs available, Suction available, Patient being monitored and Timeout performed Patient Re-evaluated:Patient Re-evaluated prior to induction Oxygen Delivery Method: Circle system utilized Preoxygenation: Pre-oxygenation with 100% oxygen Induction Type: IV induction and Rapid sequence Laryngoscope Size: Mac and 3 Grade View: Grade I Tube type: Oral Tube size: 7.0 mm Number of attempts: 1 Airway Equipment and Method: Stylet Placement Confirmation: ETT inserted through vocal cords under direct vision,  positive ETCO2 and breath sounds checked- equal and bilateral Secured at: 20 cm Tube secured with: Tape Dental Injury: Teeth and Oropharynx as per pre-operative assessment

## 2017-12-12 NOTE — Op Note (Addendum)
   OPERATIVE NOTE   PROCEDURE: Left below-the-knee amputation  PRE-OPERATIVE DIAGNOSIS: Left foot gangrene  POST-OPERATIVE DIAGNOSIS: same as above  SURGEON: Katha Cabal,, MD  ASSISTANT(S): none  ANESTHESIA: general  ESTIMATED BLOOD LOSS: 75 cc  FINDING(S): Viable muscle at the incision site  SPECIMEN(S):  Left below-the-knee amputation  INDICATIONS:   Melissa [name] is a 77 y.o. female who presents with left leg gangrene.  The patient is scheduled for a left below-the-knee amputation.  I discussed in depth with the patient the risks, benefits, and alternatives to this procedure.  The patient is aware that the risk of this operation included but are not limited to:  bleeding, infection, myocardial infarction, stroke, death, failure to heal amputation wound, and possible need for more proximal amputation.  The patient is aware of the risks and agrees proceed forward with the procedure.  DESCRIPTION:  After full informed written consent was obtained from the patient, the patient was brought back to the operating room, and placed supine upon the operating table.  Prior to induction, the patient received IV antibiotics.  The patient was then prepped and draped in the standard fashion for a below-the-knee amputation.  After obtaining adequate anesthesia, the patient was prepped and draped in the standard fashion for a left below-the-knee amputation.  I marked out the anterior incision two finger breadths below the tibial tuberosity and then the marked out a posterior flap that was one third of the circumference of the calf in length.   I made the incisions for these flaps, and then dissected through the subcutaneous tissue, fascia, and muscle anteriorly.  I elevated  the periosteal tissue superiorly so that the tibia was about 3-4 cm shorter than the anterior skin flap.  I then transected the tibia with a power saw and then took a wedge off the tibia anteriorly with the power saw.  Then I  smoothed out the rough edges.  In a similar fashion, I cut back the fibula about two centimeters higher than the level of the tibia with a bone cutter.  I put a bone hook into the distal tibia and then used a large amputation knife to sharply develop a tissue plane through the muscle along the fibula.  In such fashion, the posterior flap was developed.  At this point, the specimen was passed off the field as the below-the-knee amputation.  At this point, I clamped all visibly bleeding arteries and veins using a combination of suture ligation with Silk suture and electrocautery.  Bleeding continued to be controlled with electrocautery and suture ligature.  The stump was washed off with sterile normal saline and no further active bleeding was noted.  I reapproximated the anterior and posterior fascia  with interrupted stitches of 0 Vicryl.  This was completed along the entire length of anterior and posterior fascia until there were no more loose space in the fascial line. I then placed a layer of 2-0 Vicryl sutures in the subcutaneous tissue. The skin was then  reapproximated with staples.  The stump was washed off and dried.  The incision was dressed with Xeroform and  then fluffs were applied.  Kerlix was wrapped around the leg and then gently an ACE wrap was applied.    COMPLICATIONS: none  CONDITION: stable   Melissa [name]  12/12/2017, 3:50 PM    This note was created with Dragon Medical transcription system. Any errors in dictation are purely unintentional.

## 2017-12-12 NOTE — Transfer of Care (Signed)
Immediate Anesthesia Transfer of Care Note  Patient: Melissa [name]  Procedure(s) Performed: AMPUTATION BELOW KNEE (Left )  Patient Location: PACU  Anesthesia Type:General  Level of Consciousness: sedated  Airway & Oxygen Therapy: Patient Spontanous Breathing and Patient connected to face mask oxygen  Post-op Assessment: Report given to RN and Post -op Vital signs reviewed and stable  Post vital signs: Reviewed  Last Vitals:  Vitals Value Taken Time  BP 137/63 12/12/2017  3:56 PM  Temp    Pulse 79 12/12/2017  3:56 PM  Resp 18 12/12/2017  3:56 PM  SpO2 94 % 12/12/2017  3:56 PM  Vitals shown include unvalidated device data.  Last Pain:  Vitals:   12/12/17 1318  TempSrc: Tympanic  PainSc: Asleep      Patients Stated Pain Goal: 0 (76/72/09 4709)  Complications: No apparent anesthesia complications

## 2017-12-12 NOTE — Anesthesia Preprocedure Evaluation (Addendum)
Anesthesia Evaluation  Patient identified by MRN, date of birth, ID band Patient unresponsive    Reviewed: Allergy & Precautions, H&P , NPO status , Patient's Chart, lab work & pertinent test results  History of Anesthesia Complications Negative for: history of anesthetic complications  Airway Mallampati: III  TM Distance: >3 FB Neck ROM: full    Dental  (+) Poor Dentition, Edentulous Upper, Edentulous Lower   Pulmonary neg pulmonary ROS, shortness of breath, COPD,           Cardiovascular Exercise Tolerance: Poor hypertension, + Past MI and + Peripheral Vascular Disease       Neuro/Psych PSYCHIATRIC DISORDERS Anxiety Depression negative neurological ROS     GI/Hepatic Neg liver ROS, GERD  Medicated and Controlled,  Endo/Other  negative endocrine ROS  Renal/GU      Musculoskeletal  (+) Arthritis ,   Abdominal   Peds  Hematology negative hematology ROS (+)   Anesthesia Other Findings Past Medical History: No date: Anemia No date: Anxiety No date: Arthritis     Comment:  rheumatoid No date: Ataxia No date: COPD (chronic obstructive pulmonary disease) (HCC) No date: Depression No date: Diverticulosis No date: Excessive falling No date: GERD (gastroesophageal reflux disease) No date: Hyperlipidemia No date: Hypertension No date: IBS (irritable bowel syndrome) No date: Osteoarthritis No date: Psoriasis No date: Raynaud's disease without gangrene No date: Raynaud's disease without gangrene No date: Shortness of breath dyspnea     Comment:  with exertion  Past Surgical History: No date: ABDOMINAL HYSTERECTOMY No date: APPENDECTOMY No date: BACK SURGERY     Comment:  Spinal fusion with rods and screws No date: CHOLECYSTECTOMY 01/29/2017: COLONOSCOPY WITH PROPOFOL; N/A     Comment:  Procedure: COLONOSCOPY WITH PROPOFOL;  Surgeon: Manya Silvas, MD;  Location: [facility] ENDOSCOPY;  Service:                Endoscopy;  Laterality: N/A; 01/29/2017: ESOPHAGOGASTRODUODENOSCOPY (EGD) WITH PROPOFOL; N/A     Comment:  Procedure: ESOPHAGOGASTRODUODENOSCOPY (EGD) WITH               PROPOFOL;  Surgeon: Manya Silvas, MD;  Location:               [facility] ENDOSCOPY;  Service: Endoscopy;  Laterality: N/A; No date: EYE SURGERY; Bilateral     Comment:  Cataract Extraction with IOL No date: KNEE ARTHROSCOPY; Left     Comment:  partial lateral meniscectomy, debridement, excision of               Baker's cyst 04/23/2017: LEFT HEART CATH AND CORONARY ANGIOGRAPHY; N/A     Comment:  Procedure: LEFT HEART CATH AND CORONARY ANGIOGRAPHY;                Surgeon: Teodoro Spray, MD;  Location: [place] [facility]              LAB;  Service: Cardiovascular;  Laterality: N/A; 09/07/2017: LEFT HEART CATH AND CORONARY ANGIOGRAPHY; Left     Comment:  Procedure: LEFT HEART CATH AND CORONARY ANGIOGRAPHY;                Surgeon: Teodoro Spray, MD;  Location: [place] [facility]              LAB;  Service: Cardiovascular;  Laterality: Left; 11/27/2017: LOWER EXTREMITY ANGIOGRAPHY; Left     Comment:  Procedure: LOWER EXTREMITY ANGIOGRAPHY;  Surgeon:               Katha Cabal, MD;  Location: [place] [facility];               Service: Cardiovascular;  Laterality: Left; 12/05/2017: LOWER EXTREMITY ANGIOGRAPHY; Left     Comment:  Procedure: LOWER EXTREMITY ANGIOGRAPHY;  Surgeon:               Katha Cabal, MD;  Location: [place] [facility];               Service: Cardiovascular;  Laterality: Left; No date: ROTATOR CUFF REPAIR; Right 04/04/2016: TOTAL KNEE ARTHROPLASTY; Left     Comment:  Procedure: TOTAL KNEE ARTHROPLASTY;  Surgeon: Hessie Knows, MD;  Location: [facility] ORS;  Service: Orthopedics;                Laterality: Left;  BMI    Body Mass Index:  20.94 kg/m      Reproductive/Obstetrics negative OB ROS                            Anesthesia  Physical Anesthesia Plan  ASA: III  Anesthesia Plan: General ETT   Post-op Pain Management:    Induction: Intravenous  PONV Risk Score and Plan:   Airway Management Planned: Oral ETT  Additional Equipment:   Intra-op Plan:   Post-operative Plan: Extubation in OR and Possible Post-op intubation/ventilation  Informed Consent: I have reviewed the patients History and Physical, chart, labs and discussed the procedure including the risks, benefits and alternatives for the proposed anesthesia with the patient or authorized representative who has indicated his/her understanding and acceptance.   Dental Advisory Given  Plan Discussed with: Anesthesiologist, CRNA and Surgeon  Anesthesia Plan Comments: (Patient and family informed that patient is higher risk for complications from anesthesia during this procedure due to their medical history and age including but not limited to post operative cognitive dysfunction.  They voiced understanding.  Daughter consented for risks of anesthesia including but not limited to:  - adverse reactions to medications - damage to teeth, lips or other oral mucosa - sore throat or hoarseness - Damage to heart, brain, lungs or loss of life  Daughter voiced understanding.)        Anesthesia Quick Evaluation

## 2017-12-13 ENCOUNTER — Ambulatory Visit (INDEPENDENT_AMBULATORY_CARE_PROVIDER_SITE_OTHER): Payer: Medicare HMO | Admitting: Vascular Surgery

## 2017-12-13 ENCOUNTER — Encounter: Payer: Self-pay | Admitting: Vascular Surgery

## 2017-12-13 LAB — HEPARIN LEVEL (UNFRACTIONATED)

## 2017-12-13 LAB — BASIC METABOLIC PANEL
ANION GAP: 12 (ref 5–15)
BUN: 29 mg/dL — ABNORMAL HIGH (ref 6–20)
CALCIUM: 8.9 mg/dL (ref 8.9–10.3)
CO2: 24 mmol/L (ref 22–32)
Chloride: 98 mmol/L — ABNORMAL LOW (ref 101–111)
Creatinine, Ser: 0.82 mg/dL (ref 0.44–1.00)
GFR calc non Af Amer: >60 mL/min (ref >60)
Glucose, Bld: 154 mg/dL — ABNORMAL HIGH (ref 65–99)
Potassium: 5.1 mmol/L (ref 3.5–5.1)
Sodium: 134 mmol/L — ABNORMAL LOW (ref 135–145)

## 2017-12-13 LAB — CBC
HEMATOCRIT: 26.9 % — AB (ref 35.0–47.0)
HEMOGLOBIN: 9 g/dL — AB (ref 12.0–16.0)
MCH: 31.1 pg (ref 26.0–34.0)
MCHC: 33.4 g/dL (ref 32.0–36.0)
MCV: 93 fL (ref 80.0–100.0)
Platelets: 347 10*3/uL (ref 150–440)
RBC: 2.89 MIL/uL — ABNORMAL LOW (ref 3.80–5.20)
RDW: 15.1 % — AB (ref 11.5–14.5)
WBC: 17.8 10*3/uL — AB (ref 3.6–11.0)

## 2017-12-13 MED ORDER — DILTIAZEM HCL ER COATED BEADS 120 MG PO CP24: 120.0000 mg | ORAL_CAPSULE | Freq: Every day | ORAL | Status: DC

## 2017-12-13 MED ORDER — OXYCODONE HCL 5 MG PO TABS
5.0000 mg | ORAL_TABLET | Freq: Four times a day (QID) | ORAL | Status: DC | PRN
  Administered 2017-12-14: 10 mg via ORAL
  Filled 2017-12-13: qty 2

## 2017-12-13 MED ORDER — POLYETHYLENE GLYCOL 3350 17 G PO PACK
17.0000 g | PACK | Freq: Every day | ORAL | Status: DC | PRN
Start: 2017-12-13 — End: 2017-12-19
  Administered 2017-12-13: 17 g via ORAL
  Filled 2017-12-13: qty 1

## 2017-12-13 MED ORDER — DOCUSATE SODIUM 100 MG PO CAPS
100.0000 mg | ORAL_CAPSULE | Freq: Every day | ORAL | Status: DC
  Administered 2017-12-13 – 2017-12-19 (×5): 100 mg via ORAL
  Filled 2017-12-13 (×6): qty 1

## 2017-12-13 MED ORDER — HYDROMORPHONE HCL 1 MG/ML IJ SOLN: 0.5000 mg | INTRAMUSCULAR | Status: DC | PRN

## 2017-12-13 NOTE — Anesthesia Postprocedure Evaluation (Signed)
Anesthesia Post Note  Patient: Melissa [name]  Procedure(s) Performed: AMPUTATION BELOW KNEE (Left )  Patient location during evaluation: PACU Anesthesia Type: General Level of consciousness: sedated Pain management: pain level controlled Vital Signs Assessment: post-procedure vital signs reviewed and stable Respiratory status: spontaneous breathing, nonlabored ventilation, respiratory function stable and patient connected to nasal cannula oxygen Cardiovascular status: blood pressure returned to baseline and stable Postop Assessment: no apparent nausea or vomiting Anesthetic complications: no     Last Vitals:  Vitals:   12/12/17 2034 12/13/17 0501  BP: 115/68 (!) 146/51  Pulse: 89 79  Resp: 18 17  Temp: 36.7 C 36.5 C  SpO2: 95% 92%    Last Pain:  Vitals:   12/13/17 0643  TempSrc:   PainSc: 9                  [name]

## 2017-12-13 NOTE — Progress Notes (Signed)
Patient called nurse for pain medication at a pain level of 3, patient was very drowsy from prior dose of narcotic pain med. Nurse explained to patient that she did not wasn't to give her another dose of narcotic due to her level of drowsiness, patient verbalized understanding, patient's LLE repositioned using pillow support will continue to monitor and see if it assist's with relieving pain and if not will follow-up.Marland Kitchen

## 2017-12-13 NOTE — Progress Notes (Signed)
[facility]  Daily Progress Note   Subjective  - 1 Day Post-Op  Pain under better control  Objective Vitals:   12/13/17 1046 12/13/17 1240 12/13/17 1512 12/13/17 1530  BP: (!) 89/53 (!) 95/58 (!) 70/41 (!) 80/44  Pulse: 79 85 69   Resp:  16    Temp:  97.7 F (36.5 C)    TempSrc:  Oral    SpO2:  95%    Weight:      Height:        Intake/Output Summary (Last 24 hours) at 12/13/2017 1815 Last data filed at 12/13/2017 1547 Gross per 24 hour  Intake 460 ml  Output 450 ml  Net 10 ml    PULM  Normal effort , no use of accessory muscles CV  No JVD, RRR Abd      No distended, nontender VASC  Dressing CD&I knee is straight  Laboratory CBC    Component Value Date/Time   WBC 17.8 (H) 12/13/2017 0433   HGB 9.0 (L) 12/13/2017 0433   HGB 11.6 (L) 06/16/2013 1031   HCT 26.9 (L) 12/13/2017 0433   HCT 34.8 (L) 06/16/2013 1031   PLT 347 12/13/2017 0433   PLT 226 06/16/2013 1031    BMET    Component Value Date/Time   NA 134 (L) 12/13/2017 0433   NA 139 06/16/2013 1031   K 5.1 12/13/2017 0433   K 3.8 06/16/2013 1031   CL 98 (L) 12/13/2017 0433   CL 105 06/16/2013 1031   CO2 24 12/13/2017 0433   CO2 30 06/16/2013 1031   GLUCOSE 154 (H) 12/13/2017 0433   GLUCOSE 96 06/16/2013 1031   BUN 29 (H) 12/13/2017 0433   BUN 23 (H) 06/16/2013 1031   CREATININE 0.82 12/13/2017 0433   CREATININE 0.58 (L) 06/16/2013 1031   CALCIUM 8.9 12/13/2017 0433   CALCIUM 9.7 06/16/2013 1031   GFRNONAA >60 12/13/2017 0433   GFRNONAA >60 06/16/2013 1031   GFRAA >60 12/13/2017 0433   GFRAA >60 06/16/2013 1031    Assessment/Planning: POD #1 BKA s/p left BKA   Given low BP will adjust BP meds  Pain control better will decrease dosing as she is a bit sedated    Melissa [name]  12/13/2017, 6:15 PM

## 2017-12-14 ENCOUNTER — Inpatient Hospital Stay: Payer: Medicare HMO

## 2017-12-14 DIAGNOSIS — R4182 Altered mental status, unspecified: Secondary | ICD-10-CM

## 2017-12-14 LAB — CBC WITH DIFFERENTIAL/PLATELET
BAND NEUTROPHILS: 9 %
BASOS ABS: 0.3 10*3/uL — AB (ref 0–0.1)
BLASTS: 0 %
Basophils Relative: 1 %
EOS ABS: 0.9 10*3/uL — AB (ref 0–0.7)
Eosinophils Relative: 3 %
HCT: 25.1 % — ABNORMAL LOW (ref 35.0–47.0)
HEMOGLOBIN: 8.1 g/dL — AB (ref 12.0–16.0)
Lymphocytes Relative: 2 %
Lymphs Abs: 0.6 10*3/uL — ABNORMAL LOW (ref 1.0–3.6)
MCH: 30.4 pg (ref 26.0–34.0)
MCHC: 32.3 g/dL (ref 32.0–36.0)
MCV: 94.2 fL (ref 80.0–100.0)
METAMYELOCYTES PCT: 0 %
MONO ABS: 2.1 10*3/uL — AB (ref 0.2–0.9)
Monocytes Relative: 7 %
Myelocytes: 0 %
Neutro Abs: 25.6 10*3/uL — ABNORMAL HIGH (ref 1.4–6.5)
Neutrophils Relative %: 78 %
Other: 0 %
PLATELETS: 405 10*3/uL (ref 150–440)
PROMYELOCYTES RELATIVE: 0 %
RBC: 2.67 MIL/uL — ABNORMAL LOW (ref 3.80–5.20)
RDW: 14.8 % — ABNORMAL HIGH (ref 11.5–14.5)
SMEAR REVIEW: ADEQUATE
WBC: 29.5 10*3/uL — ABNORMAL HIGH (ref 3.6–11.0)
nRBC: 0 /100 WBC

## 2017-12-14 LAB — COMPREHENSIVE METABOLIC PANEL
ALBUMIN: 2.6 g/dL — AB (ref 3.5–5.0)
ALK PHOS: 71 U/L (ref 38–126)
ALT: 23 U/L (ref 14–54)
ANION GAP: 12 (ref 5–15)
AST: 58 U/L — ABNORMAL HIGH (ref 15–41)
BUN: 35 mg/dL — ABNORMAL HIGH (ref 6–20)
CALCIUM: 7.8 mg/dL — AB (ref 8.9–10.3)
CHLORIDE: 106 mmol/L (ref 101–111)
CO2: 19 mmol/L — AB (ref 22–32)
Creatinine, Ser: 0.74 mg/dL (ref 0.44–1.00)
GFR calc Af Amer: >60 mL/min (ref >60)
GFR calc non Af Amer: >60 mL/min (ref >60)
GLUCOSE: 132 mg/dL — AB (ref 65–99)
Potassium: 4 mmol/L (ref 3.5–5.1)
SODIUM: 137 mmol/L (ref 135–145)
Total Bilirubin: 0.5 mg/dL (ref 0.3–1.2)
Total Protein: 6.3 g/dL — ABNORMAL LOW (ref 6.5–8.1)

## 2017-12-14 LAB — PROTIME-INR
INR: 1.36
PROTHROMBIN TIME: 16.7 s — AB (ref 11.4–15.2)

## 2017-12-14 LAB — GLUCOSE, CAPILLARY: GLUCOSE-CAPILLARY: 133 mg/dL — AB (ref 65–99)

## 2017-12-14 LAB — TROPONIN I
TROPONIN I: 0.05 ng/mL — AB (ref <0.03)
TROPONIN I: 0.07 ng/mL — AB (ref <0.03)
TROPONIN I: 0.06 ng/mL — AB (ref <0.03)

## 2017-12-14 LAB — PHOSPHORUS: Phosphorus: 3.7 mg/dL (ref 2.5–4.6)

## 2017-12-14 LAB — MAGNESIUM: Magnesium: 1.8 mg/dL (ref 1.7–2.4)

## 2017-12-14 LAB — MRSA PCR SCREENING: MRSA by PCR: POSITIVE — AB

## 2017-12-14 LAB — AMMONIA: AMMONIA: 34 umol/L (ref 9–35)

## 2017-12-14 LAB — LACTIC ACID, PLASMA: Lactic Acid, Venous: 2 mmol/L (ref 0.5–1.9)

## 2017-12-14 LAB — APTT: APTT: 36 s (ref 24–36)

## 2017-12-14 MED ORDER — SODIUM CHLORIDE 0.9 % IV BOLUS
1000.0000 mL | Freq: Once | INTRAVENOUS | Status: AC
  Administered 2017-12-14: 1000 mL via INTRAVENOUS

## 2017-12-14 MED ORDER — SODIUM CHLORIDE 0.9 % IV BOLUS
500.0000 mL | Freq: Once | INTRAVENOUS | Status: AC
  Administered 2017-12-14: 500 mL via INTRAVENOUS

## 2017-12-14 MED ORDER — VANCOMYCIN HCL IN DEXTROSE 1-5 GM/200ML-% IV SOLN
1000.0000 mg | INTRAVENOUS | Status: DC
  Administered 2017-12-14 – 2017-12-15 (×2): 1000 mg via INTRAVENOUS
  Filled 2017-12-14 (×5): qty 200

## 2017-12-14 MED ORDER — NOREPINEPHRINE 4 MG/250ML-% IV SOLN
0.0000 ug/min | INTRAVENOUS | Status: DC
  Administered 2017-12-14: 2 ug/min via INTRAVENOUS
  Filled 2017-12-14: qty 250

## 2017-12-14 MED ORDER — VANCOMYCIN HCL IN DEXTROSE 1-5 GM/200ML-% IV SOLN
1000.0000 mg | Freq: Once | INTRAVENOUS | Status: AC
  Administered 2017-12-14: 1000 mg via INTRAVENOUS
  Filled 2017-12-14: qty 200

## 2017-12-14 MED ORDER — NALOXONE HCL 0.4 MG/ML IJ SOLN: 0.4000 mg | Freq: Once | INTRAMUSCULAR | Status: DC

## 2017-12-14 MED ORDER — PIPERACILLIN-TAZOBACTAM 3.375 G IVPB
3.3750 g | Freq: Three times a day (TID) | INTRAVENOUS | Status: DC
  Administered 2017-12-14 – 2017-12-16 (×6): 3.375 g via INTRAVENOUS
  Filled 2017-12-14 (×6): qty 50

## 2017-12-14 MED ORDER — CHLORHEXIDINE GLUCONATE CLOTH 2 % EX PADS
6.0000 | MEDICATED_PAD | Freq: Every day | CUTANEOUS | Status: DC
  Administered 2017-12-15 – 2017-12-19 (×4): 6 via TOPICAL

## 2017-12-14 MED ORDER — SODIUM CHLORIDE 0.9 % IV SOLN: INTRAVENOUS | Status: DC

## 2017-12-14 MED ORDER — NALOXONE HCL 0.4 MG/ML IJ SOLN
0.4000 mg | Freq: Once | INTRAMUSCULAR | Status: AC
  Administered 2017-12-14: 0.4 mg via INTRAVENOUS

## 2017-12-14 MED ORDER — SODIUM CHLORIDE 0.9 % IV SOLN
INTRAVENOUS | Status: DC
  Administered 2017-12-14 – 2017-12-18 (×8): via INTRAVENOUS

## 2017-12-14 MED ORDER — MORPHINE SULFATE (PF) 2 MG/ML IV SOLN
2.0000 mg | INTRAVENOUS | Status: DC | PRN
Start: 2017-12-14 — End: 2017-12-14

## 2017-12-14 MED ORDER — MUPIROCIN 2 % EX OINT
1.0000 "application " | TOPICAL_OINTMENT | Freq: Two times a day (BID) | CUTANEOUS | Status: AC
  Administered 2017-12-14 – 2017-12-19 (×10): 1 via NASAL
  Filled 2017-12-14: qty 22

## 2017-12-14 NOTE — Progress Notes (Signed)
Spoke with Dr. Detterding with [facility] regarding patient's increasing troponin from 0.05 to 0.07.  MD acknowledged and states she will look over the patient's chart.  No further verbal orders or instructions for this RN at this time.  Will continue to monitor.

## 2017-12-14 NOTE — Care Management (Signed)
Barriers to discharge : lethargic, febrile, hypotensive.  Repeat PT eval after amputation pending.

## 2017-12-14 NOTE — Progress Notes (Signed)
PT Cancellation Note  Patient Details Name: Melissa [name] MRN: [id number] DOB: 07/09/1941   Cancelled Treatment:    Reason Eval/Treat Not Completed: Medical issues which prohibited therapy; Nursing in room with pt upon entry with nursing requesting no PT services this date secondary to pt lab work.  Will attempt to see pt at a future date as medically appropriate.    Linus Salmons PT, DPT 12/14/17, 9:45 AM

## 2017-12-14 NOTE — Consult Note (Addendum)
[facility] Consultation  Melissa [name] CXK:[id number] DOB: 23-Nov-1940 DOA: 12/05/2017 PCP: [name], MD   Requesting physician: [name] MD Date of consultation: 12/14/2017 Reason for consultation: Altered mental status  CHIEF COMPLAINT: Consulted for altered mental status  HISTORY OF PRESENT ILLNESS: Melissa [name]  is a 77 y.o. female with a known history of COPD, diverticulosis, GERD, hyperlipidemia, hypertension, osteoarthritis, raynauds disease currently under vascular surgery service.  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  This morning patient is very lethargic and sleepy.  Her blood pressure has been low and she was given 1 L of bolus with normal saline hospitalist service was consulted.. Patient arousable to loud verbal commands opens eyes but not completely oriented to time place and person. Her lactic acid level is elevated.. Chest x-ray done this morning did not reveal any pneumonia.  Not much history could be obtained from the patient.  PAST MEDICAL HISTORY:   Past Medical History:  Diagnosis Date  . Anemia   . Anxiety   . Arthritis    rheumatoid  . Ataxia   . COPD (chronic obstructive pulmonary disease) ([place])   . Depression   . Diverticulosis   . Excessive falling   . GERD (gastroesophageal reflux disease)   . Hyperlipidemia   . Hypertension   . IBS (irritable bowel syndrome)   . Osteoarthritis   . Psoriasis   . Raynaud's disease without gangrene   . Raynaud's disease without gangrene   . Shortness of breath dyspnea    with exertion    PAST SURGICAL HISTORY:  Past Surgical History:  Procedure Laterality Date  . ABDOMINAL HYSTERECTOMY    . AMPUTATION Left 12/12/2017   Procedure: AMPUTATION BELOW KNEE;  Surgeon: Katha Cabal, MD;  Location: [facility] ORS;  Service: Vascular;  Laterality: Left;  . APPENDECTOMY    . BACK SURGERY     Spinal fusion with rods and screws  . CHOLECYSTECTOMY    . COLONOSCOPY  WITH PROPOFOL N/A 01/29/2017   Procedure: COLONOSCOPY WITH PROPOFOL;  Surgeon: Manya Silvas, MD;  Location[facility] ENDOSCOPY;  Service: Endoscopy;  Laterality: N/A;  . ESOPHAGOGASTRODUODENOSCOPY (EGD) WITH PROPOFOL N/A 01/29/2017   Procedure: ESOPHAGOGASTRODUODENOSCOPY (EGD) WITH PROPOFOL;  Surgeon: Manya Silvas, MD;  Location: [facility] ENDOSCOPY;  Service: Endoscopy;  Laterality: N/A;  . EYE SURGERY Bilateral    Cataract Extraction with IOL  . KNEE ARTHROSCOPY Left    partial lateral meniscectomy, debridement, excision of Baker's cyst  . LEFT HEART CATH AND CORONARY ANGIOGRAPHY N/A 04/23/2017   Procedure: LEFT HEART CATH AND CORONARY ANGIOGRAPHY;  Surgeon: Teodoro Spray, MD;  Location: [place] [facility];  Service: Cardiovascular;  Laterality: N/A;  . LEFT HEART CATH AND CORONARY ANGIOGRAPHY Left 09/07/2017   Procedure: LEFT HEART CATH AND CORONARY ANGIOGRAPHY;  Surgeon: Teodoro Spray, MD;  Location: [place] [facility];  Service: Cardiovascular;  Laterality: Left;  . LOWER EXTREMITY ANGIOGRAPHY Left 11/27/2017   Procedure: LOWER EXTREMITY ANGIOGRAPHY;  Surgeon: Katha Cabal, MD;  Location: [place] [facility];  Service: Cardiovascular;  Laterality: Left;  . LOWER EXTREMITY ANGIOGRAPHY Left 12/05/2017   Procedure: LOWER EXTREMITY ANGIOGRAPHY;  Surgeon: Katha Cabal, MD;  Location: [place] [facility];  Service: Cardiovascular;  Laterality: Left;  . ROTATOR CUFF REPAIR Right   . TOTAL KNEE ARTHROPLASTY Left 04/04/2016   Procedure: TOTAL KNEE ARTHROPLASTY;  Surgeon: Hessie Knows, MD;  Location: [facility] ORS;  Service: Orthopedics;  Laterality: Left;  SOCIAL HISTORY:  Social History   Tobacco Use  . Smoking status: Never Smoker  . Smokeless tobacco: Never Used  Substance Use Topics  . Alcohol use: No    FAMILY HISTORY:  Family History  Problem Relation Age of Onset  . CAD Mother   . CAD Father   . Breast cancer Neg Hx     DRUG ALLERGIES:  Allergies  Allergen  Reactions  . Lodine [Etodolac] Hives and Itching  . Methotrexate Derivatives Other (See Comments)    Flu like symptoms  . Nitrofuran Derivatives Nausea And Vomiting and Other (See Comments)    Made sicker   . Norvasc [Amlodipine] Other (See Comments)    Unknown   . Pentasa [Mesalamine Er] Other (See Comments)    Unknown   . Red Yeast Rice Extract Other (See Comments)    Patient preference   . Vioxx [Rofecoxib] Other (See Comments)    Unknown   . Levaquin [Levofloxacin] Itching, Rash and Other (See Comments)    Muscle pain  . Sulfa Antibiotics Itching and Rash    REVIEW OF SYSTEMS:  Could not be obtained secondary to confusion and lethargy  MEDICATIONS AT HOME:  Prior to Admission medications   Medication Sig Start Date End Date Taking? Authorizing Provider  acetaminophen (TYLENOL) 500 MG tablet Take 1,000 mg by mouth every 8 (eight) hours as needed for mild pain or headache.   Yes [provider]  aspirin EC 81 MG tablet Take 1 tablet (81 mg total) by mouth daily. 04/24/17  Yes Sudini, Alveta Heimlich, MD  budesonide (ENTOCORT EC) 3 MG 24 hr capsule Take 3-6 mg by mouth See admin instructions. Pt takes 3 mg one day, 6 mg the next alternating doses   Yes [provider]  Calcium Carbonate-Vitamin D (CALCIUM 600+D) 600-400 MG-UNIT tablet Take 1 tablet by mouth 2 (two) times daily.   Yes [provider]  clopidogrel (PLAVIX) 75 MG tablet Take 1 tablet (75 mg total) by mouth daily. 11/27/17  Yes Schnier, Dolores Lory, MD  Cranberry 500 MG CAPS Take 500 mg by mouth daily.   Yes [provider]  ferrous sulfate 325 (65 FE) MG tablet Take 325 mg by mouth daily with breakfast.   Yes [provider]  gemfibrozil (LOPID) 600 MG tablet Take 600 mg by mouth 2 (two) times daily before a meal.   Yes [provider]  hydroxychloroquine (PLAQUENIL) 200 MG tablet Take 200 mg by mouth 2 (two) times daily.    Yes [provider]  leflunomide (ARAVA) 20  MG tablet Take 20 mg by mouth daily.   Yes [provider]  lisinopril (PRINIVIL,ZESTRIL) 10 MG tablet Take 10 mg by mouth daily.   Yes [provider]  LORazepam (ATIVAN) 1 MG tablet Take 1 tablet (1 mg total) by mouth 2 (two) times daily as needed for anxiety. 04/24/17  Yes Sudini, Alveta Heimlich, MD  Multiple Vitamin (MULTIVITAMIN) tablet Take 1 tablet by mouth daily.   Yes [provider]  Multiple Vitamins-Minerals (PRESERVISION AREDS) CAPS Take 1 capsule by mouth 2 (two) times daily.   Yes [provider]  omeprazole (PRILOSEC) 20 MG capsule Take 20 mg by mouth 2 (two) times daily before a meal.    Yes [provider]  oxyCODONE-acetaminophen (PERCOCET/ROXICET) 5-325 MG tablet One To Two Tabs By Mouth Every Six Hours As Needed For Pain 11/30/17  Yes Stegmayer, Joelene Millin A, PA-C  pravastatin (PRAVACHOL) 80 MG tablet Take 80 mg by mouth at  bedtime.    Yes [provider]  predniSONE (DELTASONE) 5 MG tablet Take 5 mg by mouth daily with breakfast.   Yes [provider]  sertraline (ZOLOFT) 50 MG tablet Take 50 mg by mouth at bedtime.    Yes [provider]  apixaban (ELIQUIS) 5 MG TABS tablet Take 1 tablet (5 mg total) by mouth 2 (two) times daily. 04/24/17   Hillary Bow, MD  cephALEXin (KEFLEX) 500 MG capsule Take 500 mg by mouth 3 (three) times daily. For 10 days - starting 11/21/2017    [provider]  diltiazem (CARDIZEM CD) 240 MG 24 hr capsule Take 240 mg by mouth daily.    [provider]  estradiol (ESTRACE) 0.1 MG/GM vaginal cream Place 1 g vaginally 3 (three) times a week. Patient not taking: Reported on 11/27/2017 06/20/17   Hollice Espy, MD  gabapentin (NEURONTIN) 300 MG capsule Take 1 capsule (300 mg total) by mouth daily. 11/30/17   Stegmayer, Joelene Millin A, PA-C  tiZANidine (ZANAFLEX) 4 MG tablet Take 4 mg by mouth daily as needed for muscle spasms (neck pain).  06/20/17   [provider]       PHYSICAL EXAMINATION:   VITAL SIGNS: Blood pressure (!) 80/52, pulse (!) 111, temperature 99.7 F (37.6 C), temperature source Axillary, resp. rate (!) 23, height 5' 4"  (1.626 m), weight 55.3 kg (122 lb), SpO2 99 %.  GENERAL:  77 y.o.-year-old patient lying in the bed  EYES: Pupils equal, round, reactive to light and accommodation. No scleral icterus. Extraocular muscles intact.  HEENT: Head atraumatic, normocephalic. Oropharynx and nasopharynx clear.  NECK:  Supple, no jugular venous distention. No thyroid enlargement, no tenderness.  LUNGS: Normal breath sounds bilaterally, no wheezing, rales,rhonchi or crepitation. No use of accessory muscles of respiration.  CARDIOVASCULAR: S1, S2 normal. No murmurs, rubs, or gallops.  ABDOMEN: Soft, nontender, nondistended. Bowel sounds present. No organomegaly or mass.  EXTREMITIES: No pedal edema BKA noted NEUROLOGIC: Arousable to loud verbal commands Moves upper extremities Not completely oriented to time place and person PSYCHIATRIC: Could not be assessed SKIN: No obvious rash, lesion, or ulcer.   LABORATORY PANEL:   CBC Recent Labs  Lab 12/08/17 0224 12/10/17 0654 12/11/17 0437 12/12/17 0415 12/13/17 0433  WBC 11.6* 14.8* 14.9* 18.4* 17.8*  HGB 8.7* 9.0* 9.4* 9.1* 9.0*  HCT 26.2* 26.5* 28.1* 27.2* 26.9*  PLT 209 261 288 307 347  MCV 94.6 93.5 93.7 93.2 93.0  MCH 31.3 31.6 31.3 31.1 31.1  MCHC 33.1 33.8 33.4 33.3 33.4  RDW 14.3 14.6* 14.4 14.8* 15.1*   ------------------------------------------------------------------------------------------------------------------  Chemistries  Recent Labs  Lab 12/10/17 0654 12/11/17 0437 12/13/17 0433  NA 132* 133* 134*  K 4.2 4.4 5.1  CL 96* 95* 98*  CO2 27 27 24   GLUCOSE 148* 127* 154*  BUN 19 18 29*  CREATININE 0.64 0.69 0.82  CALCIUM 8.8* 9.3 8.9   ------------------------------------------------------------------------------------------------------------------ estimated  creatinine clearance is 50.4 mL/min (by C-G formula based on SCr of 0.82 mg/dL). ------------------------------------------------------------------------------------------------------------------ No results for input(s): TSH, T4TOTAL, T3FREE, THYROIDAB in the last 72 hours.  Invalid input(s): FREET3   Coagulation profile Recent Labs  Lab 12/07/17 1836  INR 1.17   ------------------------------------------------------------------------------------------------------------------- No results for input(s): DDIMER in the last 72 hours. -------------------------------------------------------------------------------------------------------------------  Cardiac Enzymes No results for input(s): CKMB, TROPONINI, MYOGLOBIN in the last 168 hours.  Invalid input(s): CK ------------------------------------------------------------------------------------------------------------------ Invalid input(s): POCBNP  ---------------------------------------------------------------------------------------------------------------  Urinalysis    Component Value Date/Time   COLORURINE AMBER (A)  04/20/2017 1809   APPEARANCEUR Clear 06/05/2017 1326   LABSPEC 1.019 04/20/2017 1809   PHURINE 6.0 04/20/2017 1809   GLUCOSEU Negative 06/05/2017 [place] 04/20/2017 1809   BILIRUBINUR Negative 06/05/2017 1326   KETONESUR 5 (A) 04/20/2017 1809   PROTEINUR 1+ (A) 06/05/2017 1326   PROTEINUR 30 (A) 04/20/2017 1809   NITRITE Negative 06/05/2017 1326   NITRITE NEGATIVE 04/20/2017 1809   LEUKOCYTESUR Negative 06/05/2017 1326     RADIOLOGY: Dg Chest 1 View  Result Date: 12/14/2017 CLINICAL DATA:  Left below the knee amputation. EXAM: CHEST  1 VIEW COMPARISON:  04/21/2017. FINDINGS: Interval borderline enlargement of the cardiac silhouette. Mildly decreased inspiration with minimal right basilar atelectasis. There is also slightly increased density at the left lung base. Diffuse osteopenia and right  shoulder degenerative changes. IMPRESSION: Decreased inspiration with minimal bibasilar atelectasis. Electronically Signed   By: Claudie Revering M.D.   On: 12/14/2017 10:25    EKG: Orders placed or performed during the hospital encounter of 04/19/17  . ED EKG  . ED EKG  . EKG 12-Lead  . EKG 12-Lead  . Repeat EKG  . Repeat EKG  . EKG 12-Lead  . EKG 12-Lead  . EKG    IMPRESSION AND PLAN:  77 year old female patient with history of raynauds disease, COPD, GERD, hyperlipidemia, hypertension, osteoarthritis currently under vascular surgery service.  -Severe hypotension from possible septic shock Blood pressure did not respond to IV fluid boluses Start patient on IV Levophed drip Transfer patient to stepdown unit  -Sepsis  check blood culture IV fluid hydration Empirically start patient on IV vancomycin and IV Zosyn antibiotic Check for source of infection  -Altered mental status secondary to sepsis and hypotension  -Status post left BKA for left foot gangrene Vascular surgery follow-up Wound care and dressings  -Check echocardiogram to assess LV function  -Hold blood pressure medication  -Thank you for consultation we will follow the patient    All the records are reviewed and case discussed with ED provider. Management plans discussed with the patient, family and they are in agreement.  CODE STATUS:Full code    Code Status Orders  (From admission, onward)        Start     Ordered   12/05/17 1909  Full code  Continuous     12/05/17 1908    Code Status History    Date Active Date Inactive Code Status Order ID Comments User Context   11/27/2017 1217 11/27/2017 1717 Full Code [id number]  Katha Cabal, MD Inpatient   09/07/2017 0835 09/07/2017 1333 Full Code [id number]  Teodoro Spray, MD Inpatient   04/19/2017 1750 04/25/2017 1753 Full Code [id number]  Vaughan Basta, MD Inpatient   04/04/2016 1027 04/07/2016 1451 Full Code [id number]  Hessie Knows, MD  Inpatient       TOTAL CRITICAL CARE TIME TAKING CARE OF THIS PATIENT: 54 minutes.    Saundra Shelling M.D on 12/14/2017 at 1:13 PM  Between 7am to 6pm - Pager - [phone]  After 6pm go to [email or web address] - password [email or web address] [facility]  [phone]  CC: Primary care physician; [name], MD

## 2017-12-14 NOTE — Consult Note (Signed)
Name: Melissa [name] MRN: [id number] DOB: 02/01/1941     CONSULTATION DATE: 12/05/2017 77 years old lady with history of renal disease, rheumatoid arthritis, peripheral artery disease, hypertension, dyslipidemia, GERD. 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  Today she was found to be in altered mental status and hypotensive.  Patient arrived to ice unit somnolent, on nasal cannula in no distress.  All history was obtained from vascular surgery and primary care physician. PAST MEDICAL HISTORY :   has a past medical history of Anemia, Anxiety, Arthritis, Ataxia, COPD (chronic obstructive pulmonary disease) (HCC), Depression, Diverticulosis, Excessive falling, GERD (gastroesophageal reflux disease), Hyperlipidemia, Hypertension, IBS (irritable bowel syndrome), Osteoarthritis, Psoriasis, Raynaud's disease without gangrene, Raynaud's disease without gangrene, and Shortness of breath dyspnea.  has a past surgical history that includes Knee arthroscopy (Left); Rotator cuff repair (Right); Abdominal hysterectomy; Cholecystectomy; Eye surgery (Bilateral); Back surgery; Appendectomy; Total knee arthroplasty (Left, 04/04/2016); Colonoscopy with propofol (N/A, 01/29/2017); Esophagogastroduodenoscopy (egd) with propofol (N/A, 01/29/2017); LEFT HEART CATH AND CORONARY ANGIOGRAPHY (N/A, 04/23/2017); LEFT HEART CATH AND CORONARY ANGIOGRAPHY (Left, 09/07/2017); Lower Extremity Angiography (Left, 11/27/2017); Lower Extremity Angiography (Left, 12/05/2017); and Amputation (Left, 12/12/2017). Prior to Admission medications   Medication Sig Start Date End Date Taking? Authorizing Provider  acetaminophen (TYLENOL) 500 MG tablet Take 1,000 mg by mouth every 8 (eight) hours as needed for mild pain or headache.   Yes [provider]  aspirin EC 81 MG tablet Take 1 tablet (81 mg total) by mouth daily. 04/24/17  Yes Sudini, Alveta Heimlich, MD  budesonide (ENTOCORT EC) 3 MG 24 hr capsule Take 3-6 mg by  mouth See admin instructions. Pt takes 3 mg one day, 6 mg the next alternating doses   Yes [provider]  Calcium Carbonate-Vitamin D (CALCIUM 600+D) 600-400 MG-UNIT tablet Take 1 tablet by mouth 2 (two) times daily.   Yes [provider]  clopidogrel (PLAVIX) 75 MG tablet Take 1 tablet (75 mg total) by mouth daily. 11/27/17  Yes Schnier, Dolores Lory, MD  Cranberry 500 MG CAPS Take 500 mg by mouth daily.   Yes [provider]  ferrous sulfate 325 (65 FE) MG tablet Take 325 mg by mouth daily with breakfast.   Yes [provider]  gemfibrozil (LOPID) 600 MG tablet Take 600 mg by mouth 2 (two) times daily before a meal.   Yes [provider]  hydroxychloroquine (PLAQUENIL) 200 MG tablet Take 200 mg by mouth 2 (two) times daily.    Yes [provider]  leflunomide (ARAVA) 20 MG tablet Take 20 mg by mouth daily.   Yes [provider]  lisinopril (PRINIVIL,ZESTRIL) 10 MG tablet Take 10 mg by mouth daily.   Yes [provider]  LORazepam (ATIVAN) 1 MG tablet Take 1 tablet (1 mg total) by mouth 2 (two) times daily as needed for anxiety. 04/24/17  Yes Sudini, Alveta Heimlich, MD  Multiple Vitamin (MULTIVITAMIN) tablet Take 1 tablet by mouth daily.   Yes [provider]  Multiple Vitamins-Minerals (PRESERVISION AREDS) CAPS Take 1 capsule by mouth 2 (two) times daily.   Yes [provider]  omeprazole (PRILOSEC) 20 MG capsule Take 20 mg by mouth 2 (two) times daily before a meal.    Yes [provider]  oxyCODONE-acetaminophen (PERCOCET/ROXICET) 5-325 MG tablet One To Two Tabs By Mouth Every Six Hours As Needed For Pain 11/30/17  Yes Stegmayer, Joelene Millin A, PA-C  pravastatin (PRAVACHOL) 80 MG tablet Take 80 mg by mouth at bedtime.  Yes [provider]  predniSONE (DELTASONE) 5 MG tablet Take 5 mg by mouth daily with breakfast.   Yes [provider]  sertraline (ZOLOFT) 50 MG tablet Take 50 mg by mouth at  bedtime.    Yes [provider]  apixaban (ELIQUIS) 5 MG TABS tablet Take 1 tablet (5 mg total) by mouth 2 (two) times daily. 04/24/17   Hillary Bow, MD  cephALEXin (KEFLEX) 500 MG capsule Take 500 mg by mouth 3 (three) times daily. For 10 days - starting 11/21/2017    [provider]  diltiazem (CARDIZEM CD) 240 MG 24 hr capsule Take 240 mg by mouth daily.    [provider]  estradiol (ESTRACE) 0.1 MG/GM vaginal cream Place 1 g vaginally 3 (three) times a week. Patient not taking: Reported on 11/27/2017 06/20/17   Hollice Espy, MD  gabapentin (NEURONTIN) 300 MG capsule Take 1 capsule (300 mg total) by mouth daily. 11/30/17   Stegmayer, Joelene Millin A, PA-C  tiZANidine (ZANAFLEX) 4 MG tablet Take 4 mg by mouth daily as needed for muscle spasms (neck pain).  06/20/17   [provider]   Allergies  Allergen Reactions  . Lodine [Etodolac] Hives and Itching  . Methotrexate Derivatives Other (See Comments)    Flu like symptoms  . Nitrofuran Derivatives Nausea And Vomiting and Other (See Comments)    Made sicker   . Norvasc [Amlodipine] Other (See Comments)    Unknown   . Pentasa [Mesalamine Er] Other (See Comments)    Unknown   . Red Yeast Rice Extract Other (See Comments)    Patient preference   . Vioxx [Rofecoxib] Other (See Comments)    Unknown   . Levaquin [Levofloxacin] Itching, Rash and Other (See Comments)    Muscle pain  . Sulfa Antibiotics Itching and Rash    FAMILY HISTORY:  family history includes CAD in her father and mother. SOCIAL HISTORY:  reports that she has never smoked. She has never used smokeless tobacco. She reports that she does not drink alcohol or use drugs.  REVIEW OF SYSTEMS:   Unable to obtain due to critical illness   VITAL SIGNS: Temp:  [97.7 F (36.5 C)-101 F (38.3 C)] 98.3 F (36.8 C) (05/31 1419) Pulse Rate:  [63-111] 100 (05/31 1445) Resp:  [16-28] 24 (05/31 1445) BP: (70-140)/(42-65) 140/65 (05/31 1445) SpO2:   [92 %-99 %] 93 % (05/31 1445)  Physical Examination:  Somnolent respond to sternal rub, not following commands On nasal cannula, no distress, bilateral equal air entry and no adventitious sounds S1 & S2 are audible with no murmur Benign abdominal exam with peristalsis Status post left below-knee amputation.  Wasted right lower extremity and no peripheral edema  ASSESSMENT / PLAN: Altered mental status more likely due to overmedication with narcotics. -Optimize analgesia and monitor neuro state.  Prerenal azotemia with intravascular volume depletion -Optimize hydration, monitor renal panel and urine output.  Atelectasis with possible aspiration pneumonia.  Bibasilar airspace disease -Continue with empiric vancomycin and Zosyn.  Monitor chest x-ray + CBC + FiO2.  Septic shock (fever T-max 101, [place] ptosis, hypotension on pressors) -Optimize volume, Levophed and monitor hemodynamics  PAD and Raynaud's disease status post left below-knee amputation. -Dual antiplatelet with aspirin Plavix, Eliquis on hold.  Treatment as per vascular  Rheumatoid arthritis -On the steroid, Plaquenil, leflunomide.  Anemia -Keep Hb more than 7 g/dL  CAD. ECHO 04/2017 LVEF 40 to 45% with hypokinesis of anteroseptal segment -No report of chest pain, dual antiplatelets and  statins -Monitor troponin and EKG  Full code  Continue with supportive care  Family was updated at the bedside  Care time 50 minutes

## 2017-12-14 NOTE — Progress Notes (Signed)
PT Cancellation Note  Patient Details Name: MANUELA HALBUR MRN: [id number] DOB: 1940/10/09   Cancelled Treatment:    Reason Eval/Treat Not Completed: Medical issues which prohibited therapy(Patient noted with transfer to CCU due to decline in medical status.  Per policy, will require new orders to resume services.  Please re-consult as medically appropriate.)  Berneice Zettlemoyer H. Owens Shark, PT, DPT, NCS 12/14/17, 9:07 PM [phone]

## 2017-12-14 NOTE — Progress Notes (Signed)
Patient reviewed by SWOT for MEWS of 5. Per RN note patient with lethargy.  Home medication reviewed and noted patient taking Ativan PRN at home. Hospital regimen is scheduled. Discussed with primary care nurse. Primary care nurse will notify MD.

## 2017-12-14 NOTE — Progress Notes (Signed)
Patient was very lethargic this morning not baseline for how she was 5/30 upon obtaining vitals BP was 73/54 automatic with HR of 104 and temp of 38.3 c. Notified Dr. Lucky Cowboy and made him aware that she had similar episode after given narcotic med for pain on 5/30 she became very drowsy, but was more alert than current condition today 5/31. Dr. Lucky Cowboy gave verbal to give fluids and have lactic acid drawn relating to vitals. Critical lactic acid called in by lab of 2.0 notified Dr. Lucky Cowboy verbal given to run another bolus of fluid and he stated he would order for internal medicine to consult on patient. Patient sleeping now will continue to monitor.

## 2017-12-14 NOTE — Progress Notes (Signed)
[facility]  Daily Progress Note   Subjective: 2 Days Post-Op: Left below-the-knee amputation  Patient seen in ICU.  Patient very lethargic.  Patient not following commands.  Patient on levo.  Objective: Vitals:   12/14/17 1253 12/14/17 1419 12/14/17 1430 12/14/17 1445  BP: (!) 80/52 (!) 89/45 (!) 70/42 140/65  Pulse:   88 100  Resp:  19 (!) 24 (!) 24  Temp: 99.7 F (37.6 C) 98.3 F (36.8 C)    TempSrc: Axillary Axillary    SpO2:  92% 93% 93%  Weight:      Height:        Intake/Output Summary (Last 24 hours) at 12/14/2017 1557 Last data filed at 12/14/2017 7981 Gross per 24 hour  Intake 3 ml  Output 400 ml  Net -397 ml   Physical Exam: Very lethargic.  Not following commands. CV: RRR Pulmonary: CTA Bilaterally Abdomen: Soft, Nontender, Nondistended Vascular:  Left lower extremity: Or dressing clean dry and intact.  Thigh soft.  Calf soft.   Laboratory: CBC    Component Value Date/Time   WBC 17.8 (H) 12/13/2017 0433   HGB 9.0 (L) 12/13/2017 0433   HGB 11.6 (L) 06/16/2013 1031   HCT 26.9 (L) 12/13/2017 0433   HCT 34.8 (L) 06/16/2013 1031   PLT 347 12/13/2017 0433   PLT 226 06/16/2013 1031   BMET    Component Value Date/Time   NA 134 (L) 12/13/2017 0433   NA 139 06/16/2013 1031   K 5.1 12/13/2017 0433   K 3.8 06/16/2013 1031   CL 98 (L) 12/13/2017 0433   CL 105 06/16/2013 1031   CO2 24 12/13/2017 0433   CO2 30 06/16/2013 1031   GLUCOSE 154 (H) 12/13/2017 0433   GLUCOSE 96 06/16/2013 1031   BUN 29 (H) 12/13/2017 0433   BUN 23 (H) 06/16/2013 1031   CREATININE 0.82 12/13/2017 0433   CREATININE 0.58 (L) 06/16/2013 1031   CALCIUM 8.9 12/13/2017 0433   CALCIUM 9.7 06/16/2013 1031   GFRNONAA >60 12/13/2017 0433   GFRNONAA >60 06/16/2013 1031   GFRAA >60 12/13/2017 0433   GFRAA >60 06/16/2013 1031   Assessment/Planning: The patient is a 77 year old female with left lower extremity peripheral vascular disease now stopped day 2 left  below the knee amputation 1) Patient upgraded to stepdown due to hypotension and sedation. 2) Will remove or dressing on postoperative day #5 (Sunday) 3) Currently ruling out sepsis with assistance from internal medicine 4) Will most likely need rehab/SNF placement once clinical condition improves  Marcelle Overlie PA-C 12/14/2017 3:57 PM

## 2017-12-14 NOTE — Progress Notes (Signed)
SWOT reviewed Lab results. Lactic 2.0 patient with lethargy and 0032 dose of oxycodone 90m with ativan scheduled dose. No recent chest xray present. Patient unable to take deep breaths and is currently febrile. MD made aware by primary care nurse. New orders given.

## 2017-12-14 NOTE — Progress Notes (Signed)
OT Cancellation Note  Patient Details Name: Melissa [name] MRN: [id number] DOB: 02/12/1941   Cancelled Treatment:    Reason Eval/Treat Not Completed: Medical issues which prohibited therapy  Order received for re-evaluation after pt had a L BKA.  She was evaluated by OT on 12/07/17 and then had a L BKA on 12/12/17 with new orders.  Chart reviewed and per PT notes and verbal update, NSG requested no therapy services this date secondary to pt lab work.  Will attempt to see pt at a future date as medically appropriate.  Chrys Racer, OTR/L ascom [phone] 12/14/17, 1:04 PM

## 2017-12-14 NOTE — Progress Notes (Signed)
Patients daughter voiced concern about when about when the internal medicine consult would be handled and they would come and assess patient.Nurse notified Dr. Estanislado Pandy. Dr. Estanislado Pandy gave verbal to bolus another liter of fluid, start IV antibiotics, he also ordered blood cultures and urine culture. Patient sleeping and remains lethargic when aroused. BP continues to be low refer to foresheets. Will continue to monitor. Dr. Lucky Cowboy gave the ok to change dressing to left extremitie amputation site and to apply kerlix. Will continue to monitor patient.

## 2017-12-14 NOTE — Progress Notes (Signed)
Spoke with patient's daughter Arrie Aran, password confirmed.  Update given to daughter at this time.

## 2017-12-14 NOTE — Progress Notes (Signed)
Pharmacy Antibiotic Note  Melissa [name] is a 77 y.o. female admitted on 12/05/2017 with scheduled left BKA, now POD2 with sepsis.  Pharmacy has been consulted for vancomycin and Zosyn dosing.  Plan: Vancomycin 1086m once at 1215pm followed by 10011mIV every 24 hours. Goal trough 15-20 mcg/mL. Stacked dose interval calculated at 10h. Vt will be ordered prior to 5th dose on 6/3.  Ke: 0.047, T1/2: 15h, Vd: 39L, calculated concentrations at steady state: 47/16 mcg/mL  Zosyn dose is 3.375g q8h IE  Height: 5' 4"  (162.6 cm) Weight: 122 lb (55.3 kg) IBW/kg (Calculated) : 54.7  Temp (24hrs), Avg:99.4 F (37.4 C), Min:97.7 F (36.5 C), Max:101 F (38.3 C)  Recent Labs  Lab 12/08/17 0224 12/10/17 0654 12/11/17 0437 12/12/17 0415 12/13/17 0433 12/14/17 0827  WBC 11.6* 14.8* 14.9* 18.4* 17.8*  --   CREATININE  --  0.64 0.69  --  0.82  --   LATICACIDVEN  --   --   --   --   --  2.0*    Estimated Creatinine Clearance: 50.4 mL/min (by C-G formula based on SCr of 0.82 mg/dL).    Allergies  Allergen Reactions  . Lodine [Etodolac] Hives and Itching  . Methotrexate Derivatives Other (See Comments)    Flu like symptoms  . Nitrofuran Derivatives Nausea And Vomiting and Other (See Comments)    Made sicker   . Norvasc [Amlodipine] Other (See Comments)    Unknown   . Pentasa [Mesalamine Er] Other (See Comments)    Unknown   . Red Yeast Rice Extract Other (See Comments)    Patient preference   . Vioxx [Rofecoxib] Other (See Comments)    Unknown   . Levaquin [Levofloxacin] Itching, Rash and Other (See Comments)    Muscle pain  . Sulfa Antibiotics Itching and Rash    Antimicrobials this admission: Vancomycin 5/31>> Zosyn 5/31>>  Microbiology results: 5/31 BCx: pending  Thank you for allowing pharmacy to be a part of this patient's care.  RoDallie PilesPharmD 12/14/2017 12:32 PM

## 2017-12-15 ENCOUNTER — Inpatient Hospital Stay
Admission: RE | Admit: 2017-12-15 | Discharge: 2017-12-15 | Disposition: A | Discharge status: Home / Self Care | Payer: Medicare HMO | Source: Ambulatory Visit | Attending: Internal Medicine | Admitting: Internal Medicine

## 2017-12-15 DIAGNOSIS — D649 Anemia, unspecified: Secondary | ICD-10-CM

## 2017-12-15 DIAGNOSIS — Z9289 Personal history of other medical treatment: Secondary | ICD-10-CM

## 2017-12-15 HISTORY — DX: Personal history of other medical treatment: Z92.89

## 2017-12-15 HISTORY — DX: Anemia, unspecified: D64.9

## 2017-12-15 LAB — CBC
HEMATOCRIT: 20.3 % — AB (ref 35.0–47.0)
Hemoglobin: 6.7 g/dL — ABNORMAL LOW (ref 12.0–16.0)
MCH: 30.9 pg (ref 26.0–34.0)
MCHC: 33.1 g/dL (ref 32.0–36.0)
MCV: 93.6 fL (ref 80.0–100.0)
Platelets: 340 10*3/uL (ref 150–440)
RBC: 2.17 MIL/uL — ABNORMAL LOW (ref 3.80–5.20)
RDW: 15.1 % — ABNORMAL HIGH (ref 11.5–14.5)
WBC: 24.7 10*3/uL — AB (ref 3.6–11.0)

## 2017-12-15 LAB — LACTIC ACID, PLASMA: Lactic Acid, Venous: 0.8 mmol/L (ref 0.5–1.9)

## 2017-12-15 LAB — BASIC METABOLIC PANEL
ANION GAP: 8 (ref 5–15)
BUN: 23 mg/dL — ABNORMAL HIGH (ref 6–20)
CALCIUM: 7.2 mg/dL — AB (ref 8.9–10.3)
CO2: 18 mmol/L — AB (ref 22–32)
Chloride: 112 mmol/L — ABNORMAL HIGH (ref 101–111)
Creatinine, Ser: 0.56 mg/dL (ref 0.44–1.00)
GFR calc Af Amer: >60 mL/min (ref >60)
GFR calc non Af Amer: >60 mL/min (ref >60)
GLUCOSE: 110 mg/dL — AB (ref 65–99)
Potassium: 3.4 mmol/L — ABNORMAL LOW (ref 3.5–5.1)
Sodium: 138 mmol/L (ref 135–145)

## 2017-12-15 LAB — MAGNESIUM: Magnesium: 1.6 mg/dL — ABNORMAL LOW (ref 1.7–2.4)

## 2017-12-15 LAB — TROPONIN I: TROPONIN I: 0.06 ng/mL — AB (ref <0.03)

## 2017-12-15 LAB — GLUCOSE, CAPILLARY: GLUCOSE-CAPILLARY: 92 mg/dL (ref 65–99)

## 2017-12-15 LAB — PREPARE RBC (CROSSMATCH)

## 2017-12-15 MED ORDER — MAGNESIUM SULFATE 2 GM/50ML IV SOLN
2.0000 g | Freq: Once | INTRAVENOUS | Status: AC
  Administered 2017-12-15: 2 g via INTRAVENOUS
  Filled 2017-12-15: qty 50

## 2017-12-15 MED ORDER — TRAMADOL HCL 50 MG PO TABS
50.0000 mg | ORAL_TABLET | Freq: Four times a day (QID) | ORAL | Status: DC | PRN
  Administered 2017-12-15 – 2017-12-19 (×7): 50 mg via ORAL
  Filled 2017-12-15 (×7): qty 1

## 2017-12-15 MED ORDER — SODIUM CHLORIDE 0.9 % IV SOLN
1.0000 g | Freq: Once | INTRAVENOUS | Status: AC
  Administered 2017-12-15: 1 g via INTRAVENOUS
  Filled 2017-12-15: qty 10

## 2017-12-15 MED ORDER — POTASSIUM CHLORIDE CRYS ER 20 MEQ PO TBCR
40.0000 meq | EXTENDED_RELEASE_TABLET | Freq: Once | ORAL | Status: AC
  Administered 2017-12-15: 40 meq via ORAL
  Filled 2017-12-15: qty 2

## 2017-12-15 MED ORDER — KETOROLAC TROMETHAMINE 15 MG/ML IJ SOLN
15.0000 mg | Freq: Four times a day (QID) | INTRAMUSCULAR | Status: AC | PRN
  Administered 2017-12-15: 15 mg via INTRAVENOUS
  Filled 2017-12-15: qty 1

## 2017-12-15 MED ORDER — LOPERAMIDE HCL 2 MG PO CAPS
4.0000 mg | ORAL_CAPSULE | Freq: Once | ORAL | Status: AC
  Administered 2017-12-15: 4 mg via ORAL
  Filled 2017-12-15: qty 2

## 2017-12-15 MED ORDER — SODIUM CHLORIDE 0.9 % IV SOLN
Freq: Once | INTRAVENOUS | Status: AC
  Administered 2017-12-15: 17:00:00 via INTRAVENOUS

## 2017-12-15 MED ORDER — ACETAMINOPHEN 325 MG PO TABS: 650.0000 mg | ORAL_TABLET | Freq: Four times a day (QID) | ORAL | Status: DC | PRN

## 2017-12-15 MED ORDER — KETOROLAC TROMETHAMINE 30 MG/ML IJ SOLN
INTRAMUSCULAR | Status: AC
  Filled 2017-12-15: qty 1

## 2017-12-15 NOTE — Progress Notes (Signed)
 [facility]  Daily Progress Note   Subjective  -  3 Days Post-Op:  L BKA.  Patient awake and alert in ICU, off Levophed.  Pain controlled.  Objective Vitals:   12/15/17 0630 12/15/17 0645 12/15/17 0700 12/15/17 0800  BP: 118/61 111/62 (!) 115/55   Pulse: 98 86 90   Resp: 14 18 16    Temp:    97.9 F (36.6 C)  TempSrc:    Oral  SpO2: 97% 96% 97%   Weight:      Height:        Intake/Output Summary (Last 24 hours) at 12/15/2017 1154 Last data filed at 12/15/2017 1113 Gross per 24 hour  Intake 3267.42 ml  Output -  Net 3267.42 ml    PULM  CTAB CV  RRR VASC  Left lower extremity: Or dressing clean dry and intact.  Thigh soft.  Calf soft.    Laboratory CBC    Component Value Date/Time   WBC 24.7 (H) 12/15/2017 0426   HGB 6.7 (L) 12/15/2017 0426   HGB 11.6 (L) 06/16/2013 1031   HCT 20.3 (L) 12/15/2017 0426   HCT 34.8 (L) 06/16/2013 1031   PLT 340 12/15/2017 0426   PLT 226 06/16/2013 1031    BMET    Component Value Date/Time   NA 138 12/15/2017 0426   NA 139 06/16/2013 1031   K 3.4 (L) 12/15/2017 0426   K 3.8 06/16/2013 1031   CL 112 (H) 12/15/2017 0426   CL 105 06/16/2013 1031   CO2 18 (L) 12/15/2017 0426   CO2 30 06/16/2013 1031   GLUCOSE 110 (H) 12/15/2017 0426   GLUCOSE 96 06/16/2013 1031   BUN 23 (H) 12/15/2017 0426   BUN 23 (H) 06/16/2013 1031   CREATININE 0.56 12/15/2017 0426   CREATININE 0.58 (L) 06/16/2013 1031   CALCIUM 7.2 (L) 12/15/2017 0426   CALCIUM 9.7 06/16/2013 1031   GFRNONAA >60 12/15/2017 0426   GFRNONAA >60 06/16/2013 1031   GFRAA >60 12/15/2017 0426   GFRAA >60 06/16/2013 1031    Assessment/Planning: The patient is a 77 year old female with left lower extremity peripheral vascular disease now postop day 3 left below the knee amputation.  Much improved overnight, off Levophed, AAOx3. 1)  PRBCs for low H/H 2) Will remove or dressing on postoperative day #5 (Sunday) 3) Medical stable to transfer out of ICU 4)  Resume PT 5) Will most likely need rehab/SNF placement once clinical condition improves  Sherie Don Chena Chohan  12/15/2017, 11:54 AM

## 2017-12-15 NOTE — Progress Notes (Signed)
PT Cancellation Note  Patient Details Name: Melissa [name] MRN: [id number] DOB: April 29, 1941   Cancelled Treatment:    Reason Eval/Treat Not Completed: Medical issues which prohibited therapy.  Low hgb and had elevated troponin.  Will try again when pt is more medically stable.  Ck hgb in AM.   Ramond Dial 12/15/2017, 1:24 PM   Mee Hives, PT MS Acute Rehab Dept. Number: [place] [facility] [place]

## 2017-12-15 NOTE — Progress Notes (Signed)
Pharmacy Antibiotic Note  Melissa [name] is a 77 y.o. female admitted on 12/05/2017 with scheduled left BKA, now POD2 with sepsis.  Pharmacy has been consulted for vancomycin and Zosyn dosing.  Plan: Vancomycin 102m once at 1215pm followed by 10035mIV every 24 hours. Goal trough 15-20 mcg/mL. Stacked dose interval calculated at 10h. Vt will be ordered prior to 5th dose on 6/3.  Ke: 0.047, T1/2: 15h, Vd: 39L, calculated concentrations at steady state: 47/16 mcg/mL  Zosyn dose is 3.375g q8h IE  Height: 5' 4"  (162.6 cm) Weight: 122 lb (55.3 kg) IBW/kg (Calculated) : 54.7  Temp (24hrs), Avg:98.7 F (37.1 C), Min:97.9 F (36.6 C), Max:99.7 F (37.6 C)  Recent Labs  Lab 12/10/17 0654 12/11/17 0437 12/12/17 0415 12/13/17 0433 12/14/17 0827 12/14/17 1958 12/15/17 0426  WBC 14.8* 14.9* 18.4* 17.8*  --  29.5* 24.7*  CREATININE 0.64 0.69  --  0.82  --  0.74 0.56  LATICACIDVEN  --   --   --   --  2.0*  --  0.8    Estimated Creatinine Clearance: 51.7 mL/min (by C-G formula based on SCr of 0.56 mg/dL).    Allergies  Allergen Reactions  . Lodine [Etodolac] Hives and Itching  . Methotrexate Derivatives Other (See Comments)    Flu like symptoms  . Nitrofuran Derivatives Nausea And Vomiting and Other (See Comments)    Made sicker   . Norvasc [Amlodipine] Other (See Comments)    Unknown   . Pentasa [Mesalamine Er] Other (See Comments)    Unknown   . Red Yeast Rice Extract Other (See Comments)    Patient preference   . Vioxx [Rofecoxib] Other (See Comments)    Unknown   . Levaquin [Levofloxacin] Itching, Rash and Other (See Comments)    Muscle pain  . Sulfa Antibiotics Itching and Rash    Antimicrobials this admission: Vancomycin 5/31>> Zosyn 5/31>>  Microbiology results: 5/31 BCx: pending   Thank you for allowing pharmacy to be a part of this patient's care.  Ruthe Roemer A, PharmD 12/15/2017 12:07 PM

## 2017-12-15 NOTE — Progress Notes (Signed)
[facility] Progress Note Patient Name: Melissa [name] DOB: [birth year] MRN: [id number]   Date of Service  12/15/2017  HPI/Events of Note  Hypokalemia and hypomag  eICU Interventions  Potassium and mag replaced     Intervention Category Intermediate Interventions: Electrolyte abnormality - evaluation and management  Demarko Zeimet 12/15/2017, 6:11 AM

## 2017-12-15 NOTE — Progress Notes (Signed)
[facility] NAME: Patriciann Becht    MR#:  [id number]  DATE OF BIRTH:  Dec 31, 1940  SUBJECTIVE:  CHIEF COMPLAINT:  No chief complaint on file.  - doing better, BP is improved - off levophed this morning - low hb, 1 unit prbc ordered  REVIEW OF SYSTEMS:  Review of Systems  Constitutional: Negative for chills, fever and malaise/fatigue.  HENT: Negative for congestion, ear discharge, hearing loss and nosebleeds.   Eyes: Negative for blurred vision and double vision.  Respiratory: Negative for cough, shortness of breath and wheezing.   Cardiovascular: Negative for chest pain and palpitations.  Gastrointestinal: Negative for abdominal pain, constipation, diarrhea, nausea and vomiting.  Genitourinary: Negative for dysuria.  Musculoskeletal: Positive for myalgias.  Neurological: Negative for dizziness, seizures and headaches.  Psychiatric/Behavioral: Negative for depression.    DRUG ALLERGIES:   Allergies  Allergen Reactions  . Lodine [Etodolac] Hives and Itching  . Methotrexate Derivatives Other (See Comments)    Flu like symptoms  . Nitrofuran Derivatives Nausea And Vomiting and Other (See Comments)    Made sicker   . Norvasc [Amlodipine] Other (See Comments)    Unknown   . Pentasa [Mesalamine Er] Other (See Comments)    Unknown   . Red Yeast Rice Extract Other (See Comments)    Patient preference   . Vioxx [Rofecoxib] Other (See Comments)    Unknown   . Levaquin [Levofloxacin] Itching, Rash and Other (See Comments)    Muscle pain  . Sulfa Antibiotics Itching and Rash    VITALS:  Blood pressure 123/69, pulse 76, temperature 98.2 F (36.8 C), temperature source Oral, resp. rate 18, height 5' 4"  (1.626 m), weight 55.3 kg (122 lb), SpO2 98 %.  PHYSICAL EXAMINATION:  Physical Exam  GENERAL:  77 y.o.-year-old patient lying in the bed with no acute distress.  EYES: Pupils equal, round, reactive to light and accommodation.  No scleral icterus. Extraocular muscles intact.  HEENT: Head atraumatic, normocephalic. Oropharynx and nasopharynx clear.  NECK:  Supple, no jugular venous distention. No thyroid enlargement, no tenderness.  LUNGS: Normal breath sounds bilaterally, no wheezing, rales,rhonchi or crepitation. No use of accessory muscles of respiration. Decreased bibasilar breath sounds CARDIOVASCULAR: S1, S2 normal. No murmurs, rubs, or gallops.  ABDOMEN: Soft, nontender, nondistended. Bowel sounds present. No organomegaly or mass.  EXTREMITIES: No  cyanosis, or clubbing. S/p left BKA NEUROLOGIC: Cranial nerves II through XII are intact. Muscle strength 5/5 in all extremities. Sensation intact. Gait not checked.  PSYCHIATRIC: The patient is alert and oriented x 3.  SKIN: No obvious rash, lesion, or ulcer.    LABORATORY PANEL:   CBC Recent Labs  Lab 12/15/17 0426  WBC 24.7*  HGB 6.7*  HCT 20.3*  PLT 340   ------------------------------------------------------------------------------------------------------------------  Chemistries  Recent Labs  Lab 12/14/17 1958 12/15/17 0426  NA 137 138  K 4.0 3.4*  CL 106 112*  CO2 19* 18*  GLUCOSE 132* 110*  BUN 35* 23*  CREATININE 0.74 0.56  CALCIUM 7.8* 7.2*  MG 1.8 1.6*  AST 58*  --   ALT 23  --   ALKPHOS 71  --   BILITOT 0.5  --    ------------------------------------------------------------------------------------------------------------------  Cardiac Enzymes Recent Labs  Lab 12/15/17 0426  TROPONINI 0.06*   ------------------------------------------------------------------------------------------------------------------  RADIOLOGY:  Dg Chest 1 View  Result Date: 12/14/2017 CLINICAL DATA:  Left below the knee amputation. EXAM: CHEST  1 VIEW COMPARISON:  04/21/2017. FINDINGS: Interval borderline  enlargement of the cardiac silhouette. Mildly decreased inspiration with minimal right basilar atelectasis. There is also slightly increased  density at the left lung base. Diffuse osteopenia and right shoulder degenerative changes. IMPRESSION: Decreased inspiration with minimal bibasilar atelectasis. Electronically Signed   By: Claudie Revering M.D.   On: 12/14/2017 10:25    EKG:   Orders placed or performed during the hospital encounter of 12/05/17  . EKG 12-Lead  . EKG 12-Lead    ASSESSMENT AND PLAN:   77 y/o with past medical history significant for hypertension, arthritis, rainouts disease, peripheral arterial disease, COPD not on home oxygen, GERD presents to hospital secondary to left foot gangrene and had BKA -Medical consult requested for postoperative sepsis  1.  Sepsis-likely from postoperative pneumonia -IV fluid resuscitation, was in ICU for Levophed. -Off Levophed this morning -Chest x-ray with bibasilar infiltrates. -On vancomycin and Zosyn.  Check MRSA PCR and narrow antibiotics tomorrow  2.  Hypokalemia and hypomagnesemia-being replaced appropriately  3.  Peripheral arterial disease-management per vascular.  Status post left BKA -Aspirin and Plavix -Pain medications with gabapentin and Toradol  4.  Altered mental status-likely secondary to increased pain medications.  Received 1 dose of Narcan.  Completely alert and oriented. -Try to avoid narcotics  5.  Acute postoperative anemia-hemoglobin dropped from baseline of 9 to 6.7.  Transfuse if less than 7 -Receiving 1 unit packed RBC transfusion today  6.  DVT prophylaxis-teds and SCDs for now  PT consult   All the records are reviewed and case discussed with Care Management/Social Workerr. Management plans discussed with the patient, family and they are in agreement.  CODE STATUS: Full Code  TOTAL TIME TAKING CARE OF THIS PATIENT: 38 minutes.   POSSIBLE D/C IN 2 DAYS, DEPENDING ON CLINICAL CONDITION.   Gladstone Lighter M.D on 12/15/2017 at 2:47 PM  Between 7am to 6pm - Pager - [phone]  After 6pm go to [email or web address] - password [email or web address]  [facility]  Office  [phone]  CC: Primary care physician; [name], MD

## 2017-12-15 NOTE — Progress Notes (Signed)
Name: Melissa [name] MRN: [id number] DOB: Nov 28, 1940     CONSULTATION DATE: 12/05/2017  Subjective & objective: Improved mental status and few episodes of diarrhea last night.  PAST MEDICAL HISTORY :   has a past medical history of Anemia, Anxiety, Arthritis, Ataxia, COPD (chronic obstructive pulmonary disease) (HCC), Depression, Diverticulosis, Excessive falling, GERD (gastroesophageal reflux disease), Hyperlipidemia, Hypertension, IBS (irritable bowel syndrome), Osteoarthritis, Psoriasis, Raynaud's disease without gangrene, Raynaud's disease without gangrene, and Shortness of breath dyspnea.  has a past surgical history that includes Knee arthroscopy (Left); Rotator cuff repair (Right); Abdominal hysterectomy; Cholecystectomy; Eye surgery (Bilateral); Back surgery; Appendectomy; Total knee arthroplasty (Left, 04/04/2016); Colonoscopy with propofol (N/A, 01/29/2017); Esophagogastroduodenoscopy (egd) with propofol (N/A, 01/29/2017); LEFT HEART CATH AND CORONARY ANGIOGRAPHY (N/A, 04/23/2017); LEFT HEART CATH AND CORONARY ANGIOGRAPHY (Left, 09/07/2017); Lower Extremity Angiography (Left, 11/27/2017); Lower Extremity Angiography (Left, 12/05/2017); and Amputation (Left, 12/12/2017). Prior to Admission medications   Medication Sig Start Date End Date Taking? Authorizing Provider  acetaminophen (TYLENOL) 500 MG tablet Take 1,000 mg by mouth every 8 (eight) hours as needed for mild pain or headache.   Yes [provider]  aspirin EC 81 MG tablet Take 1 tablet (81 mg total) by mouth daily. 04/24/17  Yes Sudini, Alveta Heimlich, MD  budesonide (ENTOCORT EC) 3 MG 24 hr capsule Take 3-6 mg by mouth See admin instructions. Pt takes 3 mg one day, 6 mg the next alternating doses   Yes [provider]  Calcium Carbonate-Vitamin D (CALCIUM 600+D) 600-400 MG-UNIT tablet Take 1 tablet by mouth 2 (two) times daily.   Yes [provider]  clopidogrel (PLAVIX) 75 MG tablet Take 1 tablet (75 mg total) by mouth  daily. 11/27/17  Yes Schnier, Dolores Lory, MD  Cranberry 500 MG CAPS Take 500 mg by mouth daily.   Yes [provider]  ferrous sulfate 325 (65 FE) MG tablet Take 325 mg by mouth daily with breakfast.   Yes [provider]  gemfibrozil (LOPID) 600 MG tablet Take 600 mg by mouth 2 (two) times daily before a meal.   Yes [provider]  hydroxychloroquine (PLAQUENIL) 200 MG tablet Take 200 mg by mouth 2 (two) times daily.    Yes [provider]  leflunomide (ARAVA) 20 MG tablet Take 20 mg by mouth daily.   Yes [provider]  lisinopril (PRINIVIL,ZESTRIL) 10 MG tablet Take 10 mg by mouth daily.   Yes [provider]  LORazepam (ATIVAN) 1 MG tablet Take 1 tablet (1 mg total) by mouth 2 (two) times daily as needed for anxiety. 04/24/17  Yes Sudini, Alveta Heimlich, MD  Multiple Vitamin (MULTIVITAMIN) tablet Take 1 tablet by mouth daily.   Yes [provider]  Multiple Vitamins-Minerals (PRESERVISION AREDS) CAPS Take 1 capsule by mouth 2 (two) times daily.   Yes [provider]  omeprazole (PRILOSEC) 20 MG capsule Take 20 mg by mouth 2 (two) times daily before a meal.    Yes [provider]  oxyCODONE-acetaminophen (PERCOCET/ROXICET) 5-325 MG tablet One To Two Tabs By Mouth Every Six Hours As Needed For Pain 11/30/17  Yes Stegmayer, Joelene Millin A, PA-C  pravastatin (PRAVACHOL) 80 MG tablet Take 80 mg by mouth at bedtime.    Yes [provider]  predniSONE (DELTASONE) 5 MG tablet Take 5 mg by mouth daily with breakfast.   Yes [provider]  sertraline (ZOLOFT) 50 MG tablet Take 50 mg by mouth at bedtime.    Yes [provider]  apixaban Arne Cleveland) 5  MG TABS tablet Take 1 tablet (5 mg total) by mouth 2 (two) times daily. 04/24/17   Hillary Bow, MD  cephALEXin (KEFLEX) 500 MG capsule Take 500 mg by mouth 3 (three) times daily. For 10 days - starting 11/21/2017    [provider]  diltiazem (CARDIZEM CD) 240  MG 24 hr capsule Take 240 mg by mouth daily.    [provider]  estradiol (ESTRACE) 0.1 MG/GM vaginal cream Place 1 g vaginally 3 (three) times a week. Patient not taking: Reported on 11/27/2017 06/20/17   Hollice Espy, MD  gabapentin (NEURONTIN) 300 MG capsule Take 1 capsule (300 mg total) by mouth daily. 11/30/17   Stegmayer, Joelene Millin A, PA-C  tiZANidine (ZANAFLEX) 4 MG tablet Take 4 mg by mouth daily as needed for muscle spasms (neck pain).  06/20/17   [provider]   Allergies  Allergen Reactions  . Lodine [Etodolac] Hives and Itching  . Methotrexate Derivatives Other (See Comments)    Flu like symptoms  . Nitrofuran Derivatives Nausea And Vomiting and Other (See Comments)    Made sicker   . Norvasc [Amlodipine] Other (See Comments)    Unknown   . Pentasa [Mesalamine Er] Other (See Comments)    Unknown   . Red Yeast Rice Extract Other (See Comments)    Patient preference   . Vioxx [Rofecoxib] Other (See Comments)    Unknown   . Levaquin [Levofloxacin] Itching, Rash and Other (See Comments)    Muscle pain  . Sulfa Antibiotics Itching and Rash    FAMILY HISTORY:  family history includes CAD in her father and mother. SOCIAL HISTORY:  reports that she has never smoked. She has never used smokeless tobacco. She reports that she does not drink alcohol or use drugs.  REVIEW OF SYSTEMS:   Unable to obtain due to critical illness   VITAL SIGNS: Temp:  [98.3 F (36.8 C)-100.9 F (38.3 C)] 98.8 F (37.1 C) (05/31 1700) Pulse Rate:  [70-111] 90 (06/01 0700) Resp:  [13-30] 16 (06/01 0700) BP: (70-149)/(42-97) 115/55 (06/01 0700) SpO2:  [92 %-99 %] 97 % (06/01 0700)  Physical Examination:  Awake and oriented and no focal neurological deficits On nasal cannula, no distress, bilateral equal air entry and no adventitious sounds S1 & S2 are audible with no murmur Benign abdominal exam with peristalsis Status post left below-knee amputation.  Wasted right lower  extremity and no peripheral edema  ASSESSMENT / PLAN:  Altered mental status due to overmedication with narcotics.(improved) -Optimize analgesia and monitor neuro state.  Prerenal azotemia with intravascular volume depletion (improved) -Optimize hydration, monitor renal panel and urine output.  Atelectasis with possible aspiration pneumonia.  Bibasilar airspace disease -Continue with empiric vancomycin and Zosyn.  Monitor chest x-ray + CBC + FiO2. -Incentive spirometer.  Septic shock (fever T-max 101, [place] ptosis, hypotension on pressors) -Optimize volume, Levophed and monitor hemodynamics  PAD and Raynaud's disease status post left below-knee amputation. -Dual antiplatelet with aspirin Plavix, Eliquis on hold.  Treatment as per vascular  Rheumatoid arthritis -On the steroid, Plaquenil, leflunomide.  Anemia with HB drop of 1.4 gm -Keep Hb more than 7 g/dL  Mild elevation of Trop. With h/o CAD. No acute ST changes on EKG.  ECHO 04/2017 LVEF 40 to 45% with hypokinesis of anteroseptal segment -No report of chest pain, dual antiplatelets and statins -Follow with ECHO and cardiology consult.  Hypokalemia, hypomagnesemia and hypocalcemia -Replete and monitor electrolytes.  Full code  Continue with supportive care  Family was  updated at the bedside  Care time 50 minutes

## 2017-12-15 NOTE — Progress Notes (Signed)
Caryl Pina RN on 2A called and given report, family at bedside aware of the transfer, no distress noted.

## 2017-12-16 LAB — CBC WITH DIFFERENTIAL/PLATELET
Basophils Absolute: 0 10*3/uL (ref 0–0.1)
Basophils Relative: 0 %
EOS PCT: 2 %
Eosinophils Absolute: 0.3 10*3/uL (ref 0–0.7)
HEMATOCRIT: 25.8 % — AB (ref 35.0–47.0)
Hemoglobin: 8.5 g/dL — ABNORMAL LOW (ref 12.0–16.0)
LYMPHS ABS: 0.9 10*3/uL — AB (ref 1.0–3.6)
Lymphocytes Relative: 4 %
MCH: 29.6 pg (ref 26.0–34.0)
MCHC: 32.9 g/dL (ref 32.0–36.0)
MCV: 89.9 fL (ref 80.0–100.0)
MONO ABS: 1.1 10*3/uL — AB (ref 0.2–0.9)
MONOS PCT: 5 %
NEUTROS ABS: 18.5 10*3/uL — AB (ref 1.4–6.5)
Neutrophils Relative %: 89 %
Platelets: 279 10*3/uL (ref 150–440)
RBC: 2.87 MIL/uL — ABNORMAL LOW (ref 3.80–5.20)
RDW: 17.5 % — AB (ref 11.5–14.5)
WBC: 20.9 10*3/uL — ABNORMAL HIGH (ref 3.6–11.0)

## 2017-12-16 LAB — C DIFFICILE QUICK SCREEN W PCR REFLEX
C DIFFICLE (CDIFF) ANTIGEN: POSITIVE — AB
C Diff toxin: NEGATIVE

## 2017-12-16 LAB — TYPE AND SCREEN
ABO/RH(D): A POS
Antibody Screen: NEGATIVE
UNIT DIVISION: 0

## 2017-12-16 LAB — BPAM RBC
Blood Product Expiration Date: 201906062359
ISSUE DATE / TIME: 201906011413
UNIT TYPE AND RH: 6200

## 2017-12-16 LAB — BASIC METABOLIC PANEL
ANION GAP: 7 (ref 5–15)
BUN: 13 mg/dL (ref 6–20)
CO2: 18 mmol/L — AB (ref 22–32)
Calcium: 7.9 mg/dL — ABNORMAL LOW (ref 8.9–10.3)
Chloride: 113 mmol/L — ABNORMAL HIGH (ref 101–111)
Creatinine, Ser: 0.4 mg/dL — ABNORMAL LOW (ref 0.44–1.00)
GFR calc Af Amer: >60 mL/min (ref >60)
GFR calc non Af Amer: >60 mL/min (ref >60)
GLUCOSE: 111 mg/dL — AB (ref 65–99)
POTASSIUM: 3.8 mmol/L (ref 3.5–5.1)
Sodium: 138 mmol/L (ref 135–145)

## 2017-12-16 LAB — ECHOCARDIOGRAM COMPLETE
Height: 64 in
WEIGHTICAEL: 1952 [oz_av]

## 2017-12-16 LAB — PHOSPHORUS: Phosphorus: 1.8 mg/dL — ABNORMAL LOW (ref 2.5–4.6)

## 2017-12-16 LAB — CLOSTRIDIUM DIFFICILE BY PCR, REFLEXED: Toxigenic C. Difficile by PCR: NEGATIVE

## 2017-12-16 LAB — CALCIUM, IONIZED: Calcium, Ionized, Serum: 4.5 mg/dL (ref 4.5–5.6)

## 2017-12-16 LAB — MAGNESIUM: Magnesium: 2 mg/dL (ref 1.7–2.4)

## 2017-12-16 MED ORDER — SODIUM CHLORIDE 0.9 % IV SOLN
1.0000 g | INTRAVENOUS | Status: DC
  Administered 2017-12-16: 1 g via INTRAVENOUS
  Filled 2017-12-16: qty 10
  Filled 2017-12-16: qty 1

## 2017-12-16 MED ORDER — LORAZEPAM 1 MG PO TABS
1.0000 mg | ORAL_TABLET | Freq: Four times a day (QID) | ORAL | Status: DC | PRN
  Administered 2017-12-16 – 2017-12-19 (×5): 1 mg via ORAL
  Filled 2017-12-16 (×5): qty 1

## 2017-12-16 MED ORDER — LOPERAMIDE HCL 2 MG PO CAPS
2.0000 mg | ORAL_CAPSULE | ORAL | Status: DC | PRN
  Administered 2017-12-16: 2 mg via ORAL
  Filled 2017-12-16: qty 1

## 2017-12-16 NOTE — Consult Note (Signed)
[facility] Consultation Note  Patient ID: Melissa [name], MRN: [id number], DOB/AGE: 77-Jun-1942 77 y.o. Admit date: 12/05/2017   Date of Consult: 12/16/2017 Primary Physician: [name], MD Primary Cardiologist: Fath  Chief Complaint: No chief complaint on file.  Reason for Consult: Atrial fibrillation with elevated troponin  HPI: 77 y.o. female with known paroxysmal nonvalvular atrial fibrillation essential hypertension mixed hyperlipidemia with a significant left lower extremity peripheral vascular disease with poor circulation and gangrene requiring left BKA.  With that the patient has had significant infection requiring multiple antibiotics and medication management.  There has been no evidence of congestive heart failure or chest pain or true angina during her hospitalization.  In addition to that the patient has not had any significant telemetry changes consistent with rapid ventricular rate.  Occasionally she has had some atrial fibrillation but controlled ventricular rate.  On anticoagulation without evidence of strokelike symptoms.  She does have a recent troponin elevation more consistent with demand ischemia rather than acute coronary syndrome.  Currently she is hemodynamically stable  Past Medical History:  Diagnosis Date  . Anemia   . Anxiety   . Arthritis    rheumatoid  . Ataxia   . COPD (chronic obstructive pulmonary disease) ([place])   . Depression   . Diverticulosis   . Excessive falling   . GERD (gastroesophageal reflux disease)   . Hyperlipidemia   . Hypertension   . IBS (irritable bowel syndrome)   . Osteoarthritis   . Psoriasis   . Raynaud's disease without gangrene   . Raynaud's disease without gangrene   . Shortness of breath dyspnea    with exertion      Surgical History:  Past Surgical History:  Procedure Laterality Date  . ABDOMINAL HYSTERECTOMY    . AMPUTATION Left 12/12/2017   Procedure: AMPUTATION BELOW KNEE;  Surgeon: Katha Cabal, MD;  Location: [facility] ORS;  Service: Vascular;  Laterality: Left;  . APPENDECTOMY    . BACK SURGERY     Spinal fusion with rods and screws  . CHOLECYSTECTOMY    . COLONOSCOPY WITH PROPOFOL N/A 01/29/2017   Procedure: COLONOSCOPY WITH PROPOFOL;  Surgeon: Manya Silvas, MD;  Location: [facility] ENDOSCOPY;  Service: Endoscopy;  Laterality: N/A;  . ESOPHAGOGASTRODUODENOSCOPY (EGD) WITH PROPOFOL N/A 01/29/2017   Procedure: ESOPHAGOGASTRODUODENOSCOPY (EGD) WITH PROPOFOL;  Surgeon: Manya Silvas, MD;  Location: [facility] ENDOSCOPY;  Service: Endoscopy;  Laterality: N/A;  . EYE SURGERY Bilateral    Cataract Extraction with IOL  . KNEE ARTHROSCOPY Left    partial lateral meniscectomy, debridement, excision of Baker's cyst  . LEFT HEART CATH AND CORONARY ANGIOGRAPHY N/A 04/23/2017   Procedure: LEFT HEART CATH AND CORONARY ANGIOGRAPHY;  Surgeon: Teodoro Spray, MD;  Location: [place] CV LAB;  Service: Cardiovascular;  Laterality: N/A;  . LEFT HEART CATH AND CORONARY ANGIOGRAPHY Left 09/07/2017   Procedure: LEFT HEART CATH AND CORONARY ANGIOGRAPHY;  Surgeon: Teodoro Spray, MD;  Location: [place] CV LAB;  Service: Cardiovascular;  Laterality: Left;  . LOWER EXTREMITY ANGIOGRAPHY Left 11/27/2017   Procedure: LOWER EXTREMITY ANGIOGRAPHY;  Surgeon: Katha Cabal, MD;  Location: [place] CV LAB;  Service: Cardiovascular;  Laterality: Left;  . LOWER EXTREMITY ANGIOGRAPHY Left 12/05/2017   Procedure: LOWER EXTREMITY ANGIOGRAPHY;  Surgeon: Katha Cabal, MD;  Location: [place] [facility] LAB;  Service: Cardiovascular;  Laterality: Left;  . ROTATOR CUFF REPAIR Right   . TOTAL KNEE ARTHROPLASTY Left 04/04/2016   Procedure: TOTAL  KNEE ARTHROPLASTY;  Surgeon: Hessie Knows, MD;  Location: [facility] ORS;  Service: Orthopedics;  Laterality: Left;     Home Meds: Prior to Admission medications   Medication Sig Start Date End Date Taking? Authorizing Provider  acetaminophen (TYLENOL) 500 MG  tablet Take 1,000 mg by mouth every 8 (eight) hours as needed for mild pain or headache.   Yes [provider]  aspirin EC 81 MG tablet Take 1 tablet (81 mg total) by mouth daily. 04/24/17  Yes Sudini, Alveta Heimlich, MD  budesonide (ENTOCORT EC) 3 MG 24 hr capsule Take 3-6 mg by mouth See admin instructions. Pt takes 3 mg one day, 6 mg the next alternating doses   Yes [provider]  Calcium Carbonate-Vitamin D (CALCIUM 600+D) 600-400 MG-UNIT tablet Take 1 tablet by mouth 2 (two) times daily.   Yes [provider]  clopidogrel (PLAVIX) 75 MG tablet Take 1 tablet (75 mg total) by mouth daily. 11/27/17  Yes Schnier, Dolores Lory, MD  Cranberry 500 MG CAPS Take 500 mg by mouth daily.   Yes [provider]  ferrous sulfate 325 (65 FE) MG tablet Take 325 mg by mouth daily with breakfast.   Yes [provider]  gemfibrozil (LOPID) 600 MG tablet Take 600 mg by mouth 2 (two) times daily before a meal.   Yes [provider]  hydroxychloroquine (PLAQUENIL) 200 MG tablet Take 200 mg by mouth 2 (two) times daily.    Yes [provider]  leflunomide (ARAVA) 20 MG tablet Take 20 mg by mouth daily.   Yes [provider]  lisinopril (PRINIVIL,ZESTRIL) 10 MG tablet Take 10 mg by mouth daily.   Yes [provider]  LORazepam (ATIVAN) 1 MG tablet Take 1 tablet (1 mg total) by mouth 2 (two) times daily as needed for anxiety. 04/24/17  Yes Sudini, Alveta Heimlich, MD  Multiple Vitamin (MULTIVITAMIN) tablet Take 1 tablet by mouth daily.   Yes [provider]  Multiple Vitamins-Minerals (PRESERVISION AREDS) CAPS Take 1 capsule by mouth 2 (two) times daily.   Yes [provider]  omeprazole (PRILOSEC) 20 MG capsule Take 20 mg by mouth 2 (two) times daily before a meal.    Yes [provider]  oxyCODONE-acetaminophen (PERCOCET/ROXICET) 5-325 MG tablet One To Two Tabs By Mouth Every Six Hours As Needed For Pain 11/30/17  Yes Stegmayer,  Joelene Millin A, PA-C  pravastatin (PRAVACHOL) 80 MG tablet Take 80 mg by mouth at bedtime.    Yes [provider]  predniSONE (DELTASONE) 5 MG tablet Take 5 mg by mouth daily with breakfast.   Yes [provider]  sertraline (ZOLOFT) 50 MG tablet Take 50 mg by mouth at bedtime.    Yes [provider]  apixaban (ELIQUIS) 5 MG TABS tablet Take 1 tablet (5 mg total) by mouth 2 (two) times daily. 04/24/17   Hillary Bow, MD  cephALEXin (KEFLEX) 500 MG capsule Take 500 mg by mouth 3 (three) times daily. For 10 days - starting 11/21/2017    [provider]  diltiazem (CARDIZEM CD) 240 MG 24 hr capsule Take 240 mg by mouth daily.    [provider]  estradiol (ESTRACE) 0.1 MG/GM vaginal cream Place 1 g vaginally 3 (three) times a week. Patient not taking: Reported on 11/27/2017 06/20/17   Hollice Espy, MD  gabapentin (NEURONTIN) 300 MG capsule Take 1 capsule (300 mg total) by mouth daily. 11/30/17   Stegmayer, Joelene Millin A, PA-C  tiZANidine (ZANAFLEX) 4 MG tablet Take 4 mg  by mouth daily as needed for muscle spasms (neck pain).  06/20/17   [provider]    Inpatient Medications:  . aspirin EC  81 mg Oral Daily  . Chlorhexidine Gluconate Cloth  6 each Topical Q0600  . clopidogrel  75 mg Oral Daily  . docusate sodium  100 mg Oral Daily  . feeding supplement  1 Container Oral TID BM  . ferrous sulfate  325 mg Oral Q breakfast  . gabapentin  300 mg Oral Daily  . gemfibrozil  600 mg Oral BID AC  . hydroxychloroquine  200 mg Oral BID  . leflunomide  20 mg Oral Daily  . multivitamin with minerals  1 tablet Oral Daily  . mupirocin ointment  1 application Nasal BID  . pantoprazole  40 mg Oral Daily  . pravastatin  80 mg Oral QHS  . predniSONE  5 mg Oral Q breakfast  . sertraline  50 mg Oral QHS  . sodium chloride flush  3 mL Intravenous Q12H   . sodium chloride    . sodium chloride 50 mL/hr at 12/15/17 2031  . piperacillin-tazobactam (ZOSYN)  IV 3.375  g (12/16/17 0550)  . vancomycin Stopped (12/15/17 2328)    Allergies:  Allergies  Allergen Reactions  . Lodine [Etodolac] Hives and Itching  . Methotrexate Derivatives Other (See Comments)    Flu like symptoms  . Nitrofuran Derivatives Nausea And Vomiting and Other (See Comments)    Made sicker   . Norvasc [Amlodipine] Other (See Comments)    Unknown   . Pentasa [Mesalamine Er] Other (See Comments)    Unknown   . Red Yeast Rice Extract Other (See Comments)    Patient preference   . Vioxx [Rofecoxib] Other (See Comments)    Unknown   . Levaquin [Levofloxacin] Itching, Rash and Other (See Comments)    Muscle pain  . Sulfa Antibiotics Itching and Rash    Social History   Socioeconomic History  . Marital status: Married    Spouse name: Not on file  . Number of children: Not on file  . Years of education: Not on file  . Highest education level: Not on file  Occupational History  . Not on file  Social Needs  . Financial resource strain: Not on file  . Food insecurity:    Worry: Not on file    Inability: Not on file  . Transportation needs:    Medical: Not on file    Non-medical: Not on file  Tobacco Use  . Smoking status: Never Smoker  . Smokeless tobacco: Never Used  Substance and Sexual Activity  . Alcohol use: No  . Drug use: No  . Sexual activity: Not on file  Lifestyle  . Physical activity:    Days per week: Not on file    Minutes per session: Not on file  . Stress: Not on file  Relationships  . Social connections:    Talks on phone: Not on file    Gets together: Not on file    Attends religious service: Not on file    Active member of club or organization: Not on file    Attends meetings of clubs or organizations: Not on file    Relationship status: Not on file  . Intimate partner violence:    Fear of current or ex partner: Not on file    Emotionally abused: Not on file    Physically abused: Not on file    Forced sexual activity: Not on file  Other  Topics Concern  . Not on file  Social History Narrative  . Not on file     Family History  Problem Relation Age of Onset  . CAD Mother   . CAD Father   . Breast cancer Neg Hx      Review of Systems Positive for pain Negative for: General:  chills, fever, night sweats or weight changes.  Cardiovascular: PND orthopnea syncope dizziness  Dermatological skin lesions rashes Respiratory: Cough congestion Urologic: Frequent urination urination at night and hematuria Abdominal: negative for nausea, vomiting, diarrhea, bright red blood per rectum, melena, or hematemesis Neurologic: negative for visual changes, and/or hearing changes  All other systems reviewed and are otherwise negative except as noted above.  Labs: Recent Labs    12/14/17 1635 12/14/17 1958 12/14/17 2225 12/15/17 0426  TROPONINI 0.05* 0.07* 0.06* 0.06*   Lab Results  Component Value Date   WBC 20.9 (H) 12/16/2017   HGB 8.5 (L) 12/16/2017   HCT 25.8 (L) 12/16/2017   MCV 89.9 12/16/2017   PLT 279 12/16/2017    Recent Labs  Lab 12/14/17 1958  12/16/17 0556  NA 137   < > 138  K 4.0   < > 3.8  CL 106   < > 113*  CO2 19*   < > 18*  BUN 35*   < > 13  CREATININE 0.74   < > 0.40*  CALCIUM 7.8*   < > 7.9*  PROT 6.3*  --   --   BILITOT 0.5  --   --   ALKPHOS 71  --   --   ALT 23  --   --   AST 58*  --   --   GLUCOSE 132*   < > 111*   < > = values in this interval not displayed.   Lab Results  Component Value Date   CHOL 156 04/19/2017   HDL 24 (L) 04/19/2017   LDLCALC 84 04/19/2017   TRIG 239 (H) 04/19/2017   No results found for: DDIMER  Radiology/Studies:  Dg Chest 1 View  Result Date: 12/14/2017 CLINICAL DATA:  Left below the knee amputation. EXAM: CHEST  1 VIEW COMPARISON:  04/21/2017. FINDINGS: Interval borderline enlargement of the cardiac silhouette. Mildly decreased inspiration with minimal right basilar atelectasis. There is also slightly increased density at the left lung base. Diffuse  osteopenia and right shoulder degenerative changes. IMPRESSION: Decreased inspiration with minimal bibasilar atelectasis. Electronically Signed   By: Claudie Revering M.D.   On: 12/14/2017 10:25    EKG: Atrial fibrillation with nonspecific ST and T wave changes  Weights: Filed Weights   12/05/17 1346  Weight: 122 lb (55.3 kg)     Physical Exam: Blood pressure 136/67, pulse 79, temperature 97.9 F (36.6 C), temperature source Oral, resp. rate 16, height 5' 4"  (1.626 m), weight 122 lb (55.3 kg), SpO2 99 %. Body mass index is 20.94 kg/m. General: Well developed, well nourished, in no acute distress. Head eyes ears nose throat: Normocephalic, atraumatic, sclera non-icteric, no xanthomas, nares are without discharge. No apparent thyromegaly and/or mass  Lungs: Normal respiratory effort.  no wheezes, no rales, no rhonchi.  Heart: Irregular with normal S1 S2. no murmur gallop, no rub, PMI is normal size and placement, carotid upstroke normal without bruit, jugular venous pressure is normal Abdomen: Soft, non-tender, non-distended with normoactive bowel sounds. No hepatomegaly. No rebound/guarding. No obvious abdominal masses. Abdominal aorta is normal size without bruit Extremities: Trace edema. no cyanosis, no  clubbing, no ulcers  Peripheral : 2+ bilateral upper extremity pulses, 2+ bilateral femoral pulses, 1 + right dorsal pedal pulse Neuro: Not alert and oriented. No facial asymmetry. No focal deficit. Moves all extremities spontaneously. Musculoskeletal: Normal muscle tone without kyphosis Psych: Does not responds to questions appropriately with a normal affect.    Assessment: 77 year old female with paroxysmal nonvalvular atrial fibrillation essential hypertension mixed hyperlipidemia and peripheral vascular disease status post surgical intervention BKA with infection and elevated troponin consistent with demand ischemia rather than acute coronary syndrome currently hemodynamically  stable  Plan: 1.  Continue supportive care of infection status post surgery 2.  Continue current medical regimen for heart rate control and possible maintenance of normal sinus rhythm without change 3.  Anticoagulation for further risk reduction in stroke with atrial fibrillation 4.  Further cardiac diagnostics necessary at this time for minimal elevation of troponin more consistent with demand ischemia rather than acute coronary syndrome 5.  Begin ambulation and rehabilitation without restriction 6.  Call if further questions  Signed, Corey Skains M.D. [facility] 12/16/2017, 9:16 AM

## 2017-12-16 NOTE — Progress Notes (Signed)
[facility]  Daily Progress Note   Subjective  -  4 Days Post-Op:  L BKA.  Patient resting comfortable in bed.  No complaints.  No issues per nursing.   Objective Vitals:   12/15/17 1737 12/15/17 1943 12/16/17 0309 12/16/17 0728  BP: 122/62 116/60 130/76 136/67  Pulse: 86 80 79 79  Resp: 18 17  16   Temp: 98.7 F (37.1 C) 98.7 F (37.1 C) 98.2 F (36.8 C) 97.9 F (36.6 C)  TempSrc: Oral Oral Oral Oral  SpO2: 95% 94% 99% 99%  Weight:      Height:        Intake/Output Summary (Last 24 hours) at 12/16/2017 1145 Last data filed at 12/16/2017 0000 Gross per 24 hour  Intake 779.17 ml  Output 600 ml  Net 179.17 ml    PULM  CTAB CV  RRR VASC  Left BKA stump - dressing taken down, wound c/d/i  Laboratory CBC    Component Value Date/Time   WBC 20.9 (H) 12/16/2017 0556   HGB 8.5 (L) 12/16/2017 0556   HGB 11.6 (L) 06/16/2013 1031   HCT 25.8 (L) 12/16/2017 0556   HCT 34.8 (L) 06/16/2013 1031   PLT 279 12/16/2017 0556   PLT 226 06/16/2013 1031    BMET    Component Value Date/Time   NA 138 12/16/2017 0556   NA 139 06/16/2013 1031   K 3.8 12/16/2017 0556   K 3.8 06/16/2013 1031   CL 113 (H) 12/16/2017 0556   CL 105 06/16/2013 1031   CO2 18 (L) 12/16/2017 0556   CO2 30 06/16/2013 1031   GLUCOSE 111 (H) 12/16/2017 0556   GLUCOSE 96 06/16/2013 1031   BUN 13 12/16/2017 0556   BUN 23 (H) 06/16/2013 1031   CREATININE 0.40 (L) 12/16/2017 0556   CREATININE 0.58 (L) 06/16/2013 1031   CALCIUM 7.9 (L) 12/16/2017 0556   CALCIUM 9.7 06/16/2013 1031   GFRNONAA >60 12/16/2017 0556   GFRNONAA >60 06/16/2013 1031   GFRAA >60 12/16/2017 0556   GFRAA >60 06/16/2013 1031   ECHO 12/15/17 -  Study Conclusions  - Left ventricle: Systolic function was mildly reduced. The   estimated ejection fraction was in the range of 45% to 50%. - Aortic valve: Valve area (VTI): 1.47 cm^2. Valve area (Vmax): 1.3   cm^2. Valve area (Vmean): 1.26 cm^2. - Mitral valve:  Calcified annulus. Mildly thickened leaflets .   There was moderate regurgitation. Valve area by continuity   equation (using LVOT flow): 1.76 cm^2. - Left atrium: The atrium was mildly dilated. - Right atrium: The atrium was mildly dilated.  Assessment/Planning: The patient is a 77 year old female with left lower extremity peripheral vascular disease now postop day 4 left below the knee amputation.  1) Postop sepsis - likely d/t pna, improved with abx and supportive care 2) AMS - likely d/t narcotics, received narcan, avoid narcotics for pain control 3) Elevated troponin likely demand related per cardiology - ECHO done, cleared to resume rehab/PT 4) Anemia improved post transfusion 5) Resume PT 6) Will most likely need rehab/SNF placement once clinical condition improves  Chyler Creely E [place]  12/16/2017, 11:45 AM

## 2017-12-16 NOTE — Progress Notes (Signed)
[facility] NAME: Melissa [name]    MR#:  [id number]  DATE OF BIRTH:  27-Aug-1940  SUBJECTIVE:  CHIEF COMPLAINT:  No chief complaint on file.  - confused and restless last night- sleeping now - pain better, s/p 1 unit prbc Tx and hb stable, BP is better  REVIEW OF SYSTEMS:  Review of Systems  Constitutional: Negative for chills, fever and malaise/fatigue.  HENT: Negative for congestion, ear discharge, hearing loss and nosebleeds.   Eyes: Negative for blurred vision and double vision.  Respiratory: Negative for cough, shortness of breath and wheezing.   Cardiovascular: Negative for chest pain and palpitations.  Gastrointestinal: Negative for abdominal pain, constipation, diarrhea, nausea and vomiting.  Genitourinary: Negative for dysuria.  Musculoskeletal: Positive for myalgias.  Neurological: Negative for dizziness, seizures and headaches.  Psychiatric/Behavioral: Negative for depression.    DRUG ALLERGIES:   Allergies  Allergen Reactions  . Lodine [Etodolac] Hives and Itching  . Methotrexate Derivatives Other (See Comments)    Flu like symptoms  . Nitrofuran Derivatives Nausea And Vomiting and Other (See Comments)    Made sicker   . Norvasc [Amlodipine] Other (See Comments)    Unknown   . Pentasa [Mesalamine Er] Other (See Comments)    Unknown   . Red Yeast Rice Extract Other (See Comments)    Patient preference   . Vioxx [Rofecoxib] Other (See Comments)    Unknown   . Levaquin [Levofloxacin] Itching, Rash and Other (See Comments)    Muscle pain  . Sulfa Antibiotics Itching and Rash    VITALS:  Blood pressure 136/67, pulse 79, temperature 97.9 F (36.6 C), temperature source Oral, resp. rate 16, height 5' 4"  (1.626 m), weight 55.3 kg (122 lb), SpO2 99 %.  PHYSICAL EXAMINATION:  Physical Exam  GENERAL:  77 y.o.-year-old patient lying in the bed with no acute distress.  EYES: Pupils equal, round, reactive to  light and accommodation. No scleral icterus. Extraocular muscles intact.  HEENT: Head atraumatic, normocephalic. Oropharynx and nasopharynx clear.  NECK:  Supple, no jugular venous distention. No thyroid enlargement, no tenderness.  LUNGS: Normal breath sounds bilaterally, no wheezing, rales,rhonchi or crepitation. No use of accessory muscles of respiration. Decreased bibasilar breath sounds CARDIOVASCULAR: S1, S2 normal. No murmurs, rubs, or gallops.  ABDOMEN: Soft, nontender, nondistended. Bowel sounds present. No organomegaly or mass.  EXTREMITIES: No  cyanosis, or clubbing. S/p left BKA NEUROLOGIC: Cranial nerves II through XII are intact. Muscle strength 5/5 in all extremities. Sensation intact. Gait not checked.  PSYCHIATRIC: The patient is sleeping, easily arousable. alert and oriented x 3.  SKIN: No obvious rash, lesion, or ulcer.    LABORATORY PANEL:   CBC Recent Labs  Lab 12/16/17 0556  WBC 20.9*  HGB 8.5*  HCT 25.8*  PLT 279   ------------------------------------------------------------------------------------------------------------------  Chemistries  Recent Labs  Lab 12/14/17 1958  12/16/17 0556  NA 137   < > 138  K 4.0   < > 3.8  CL 106   < > 113*  CO2 19*   < > 18*  GLUCOSE 132*   < > 111*  BUN 35*   < > 13  CREATININE 0.74   < > 0.40*  CALCIUM 7.8*   < > 7.9*  MG 1.8   < > 2.0  AST 58*  --   --   ALT 23  --   --   ALKPHOS 71  --   --  BILITOT 0.5  --   --    < > = values in this interval not displayed.   ------------------------------------------------------------------------------------------------------------------  Cardiac Enzymes Recent Labs  Lab 12/15/17 0426  TROPONINI 0.06*   ------------------------------------------------------------------------------------------------------------------  RADIOLOGY:  No results found.  EKG:   Orders placed or performed during the hospital encounter of 12/05/17  . EKG 12-Lead  . EKG 12-Lead     ASSESSMENT AND PLAN:   77 y/o with past medical history significant for hypertension, arthritis, rainouts disease, peripheral arterial disease, COPD not on home oxygen, GERD presents to hospital secondary to left foot gangrene and had BKA -Medical consult requested for postoperative sepsis  1.  Sepsis-likely from postoperative pneumonia -IV fluid resuscitation, was in ICU for Levophed. -Off Levophed now, WBC still elevated- check UA -Chest x-ray with bibasilar infiltrates. -On vancomycin and Zosyn.  Narrow ABX today- change to rocephin only.  2.  Hypokalemia and hypomagnesemia-replaced appropriately  3.  Peripheral arterial disease-management per vascular.  Status post left BKA -Aspirin and Plavix -Pain medications with gabapentin and tramadol  4.  Altered mental status-likely secondary to increased pain medications.  Received 1 dose of Narcan.  Completely alert and oriented. -Try to avoid narcotics  5.  Acute postoperative anemia-hemoglobin dropped from baseline of 9 to 6.7.   -Received 1 unit packed RBC yesterday and hemoglobin is at 8.5 -Continue to monitor and transfuse as needed  6.  DVT prophylaxis-teds and SCDs for now  7. Elevated troponin- stable, appreciate cardiology consult.  Demand ischemia.  PT consult pending-likely will need rehab Updated son at bedside   All the records are reviewed and case discussed with Care Management/Social Workerr. Management plans discussed with the patient, family and they are in agreement.  CODE STATUS: Full Code  TOTAL TIME TAKING CARE OF THIS PATIENT: 33 minutes.   POSSIBLE D/C IN 1-2 DAYS, DEPENDING ON CLINICAL CONDITION.   Gladstone Lighter M.D on 12/16/2017 at 12:39 PM  Between 7am to 6pm - Pager - [phone]  After 6pm go to [email or web address] - password [email or web address] [facility]  Office  [phone]  CC: Primary care physician; [name], MD

## 2017-12-16 NOTE — Evaluation (Signed)
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  Pt had infection and PVD on LLE, with complicating PAF and elevated troponin.  Had postop PNA and sepsis as well as AMS. PMHx:  LLE gangrene, COPD, back pain, raynauds, OA, RA, falls, HTN,  SOB with exertion  Clinical Impression  Pt was seen for evaluation of mobility after having a BK amputation and now is demonstrating ability to assist with some movement.  However, she is home with husband who cannot physically assist her and will therefore need rehab.  Talked with son who was there and is interested in having rehab as well.  Focus rehab on her strength and balance to increase standing and gait control toward eventual prosthetic fitting.  Will work on the same with acute therapy goals.    Follow Up Recommendations SNF    Equipment Recommendations  None recommended by PT(will allow her venue to decide equipment needs)    Recommendations for Other Services       Precautions / Restrictions Precautions Precautions: Fall(telemetry, contact) Restrictions Weight Bearing Restrictions: Yes LLE Weight Bearing: Non weight bearing      Mobility  Bed Mobility Overal bed mobility: Needs Assistance Bed Mobility: Supine to Sit     Supine to sit: Mod assist     General bed mobility comments: pt was cued to roll and used bed pad to assist scoot to EOB  Transfers Overall transfer level: Needs assistance Equipment used: Rolling walker (2 wheeled);1 person hand held assist Transfers: Sit to/from Stand Sit to Stand: Max assist            Ambulation/Gait             General Gait Details: unable   Stairs             Wheelchair Mobility    Modified Rankin (Stroke Patients Only)       Balance Overall balance assessment: Needs assistance Sitting-balance support: Feet supported;Bilateral upper extremity supported Sitting balance-Leahy Scale: Fair Sitting balance - Comments: less than fair dynamic sitting balance   Standing balance support: Bilateral upper extremity supported;During functional activity Standing balance-Leahy Scale: Poor Standing balance comment: requires assistance to support R knee to stand briefly                             Pertinent Vitals/Pain Pain Assessment: No/denies pain Pain Intervention(s): Monitored during session;Repositioned    Home Living Family/patient expects to be discharged to:: Private residence Living Arrangements: Spouse/significant other Available Help at Discharge: Family;Available 24 hours/day Type of Home: House Home Access: Stairs to enter Entrance Stairs-Rails: None Entrance Stairs-Number of Steps: 1 Home Layout: One level Home Equipment: Cane - single point;Bedside commode;Shower seat;Walker - 2 wheels;Grab bars - tub/shower Additional Comments: Pt lives with husband who cannot provide physical assist but will be able to assist with providing meals and bringing pt anything she needs.  Two children live very close and will be able to do the grocery shopping and running errands.     Prior Function Level of Independence: Independent with assistive device(s)         Comments: has been using RW with minimal WB  on LLE due to pain,      Hand Dominance   Dominant Hand: Right    Extremity/Trunk Assessment   Upper Extremity Assessment Upper Extremity Assessment: Generalized weakness    Lower Extremity Assessment Lower Extremity Assessment: Generalized weakness LLE Deficits / Details: avoided testing L knee to avoid pain, but hip was 3+;  RLE was 4- to 4+ LLE Coordination: decreased fine motor;decreased gross motor     Cervical / Trunk Assessment Cervical / Trunk Assessment: Other exceptions Cervical / Trunk Exceptions: back pain without current complaints  Communication   Communication: No difficulties  Cognition Arousal/Alertness: Lethargic Behavior During Therapy: Flat affect Overall Cognitive Status: Difficult to assess                                        General Comments General comments (skin integrity, edema, etc.): pt has posop bandage on L BK surgery with clean covering    Exercises     Assessment/Plan    PT Assessment Patient needs continued PT services  PT Problem List Decreased strength;Decreased range of motion;Decreased activity tolerance;Decreased balance;Decreased mobility;Decreased coordination;Decreased cognition;Decreased knowledge of use of DME;Decreased safety awareness;Cardiopulmonary status limiting activity;Decreased skin integrity       PT Treatment Interventions DME instruction;Gait training;Stair training;Functional mobility training;Therapeutic activities;Therapeutic exercise;Neuromuscular re-education;Balance training;Patient/family education    PT Goals (Current goals can be found in the Care Plan section)  Acute Rehab PT Goals Patient Stated Goal: none stated PT Goal Formulation: With family Time For Goal Achievement: 12/30/17 Potential to Achieve Goals: Fair    Frequency Min 2X/week   Barriers to discharge Inaccessible home environment;Decreased caregiver support one step to enter, husband cannot assist her    Co-evaluation               AM-PAC PT "6 Clicks" Daily Activity  Outcome Measure Difficulty turning over in bed (including adjusting bedclothes, sheets and blankets)?: Unable Difficulty moving from lying on back to sitting on the side of the bed? : Unable Difficulty sitting down on and standing up from a chair with arms (e.g., wheelchair, bedside commode, etc,.)?: Unable Help needed moving to and from a bed to chair  (including a wheelchair)?: Total Help needed walking in hospital room?: Total Help needed climbing 3-5 steps with a railing? : Total 6 Click Score: 6    End of Session Equipment Utilized During Treatment: Gait belt Activity Tolerance: Patient limited by fatigue Patient left: in bed;with call bell/phone within reach;with bed alarm set;with family/visitor present Nurse Communication: Need for lift equipment;Mobility status PT Visit Diagnosis: Unsteadiness on feet (R26.81);Muscle weakness (generalized) (M62.81);Difficulty in walking, not elsewhere classified (R26.2);History of falling (Z91.81)    Time: 8841-6606 PT Time Calculation (min) (ACUTE ONLY): 23 min   Charges:   PT Evaluation $PT Eval Moderate Complexity: 1 Mod PT Treatments $Therapeutic Activity: 8-22 mins   PT G Codes:   PT G-Codes **NOT FOR INPATIENT CLASS** Functional Assessment Tool Used: AM-PAC 6 Clicks Basic Mobility    Ramond Dial 12/16/2017, 4:47 PM   Mee Hives, PT MS Acute Rehab Dept. Number: [place] and [place]

## 2017-12-17 LAB — URINALYSIS, COMPLETE (UACMP) WITH MICROSCOPIC
BILIRUBIN URINE: NEGATIVE
Glucose, UA: NEGATIVE mg/dL
Hgb urine dipstick: NEGATIVE
Ketones, ur: NEGATIVE mg/dL
NITRITE: NEGATIVE
PH: 6 (ref 5.0–8.0)
Protein, ur: NEGATIVE mg/dL
SPECIFIC GRAVITY, URINE: 1.012 (ref 1.005–1.030)

## 2017-12-17 LAB — CBC
HCT: 25.7 % — ABNORMAL LOW (ref 35.0–47.0)
Hemoglobin: 8.7 g/dL — ABNORMAL LOW (ref 12.0–16.0)
MCH: 30.6 pg (ref 26.0–34.0)
MCHC: 33.9 g/dL (ref 32.0–36.0)
MCV: 90.3 fL (ref 80.0–100.0)
PLATELETS: 279 10*3/uL (ref 150–440)
RBC: 2.85 MIL/uL — AB (ref 3.80–5.20)
RDW: 17.4 % — ABNORMAL HIGH (ref 11.5–14.5)
WBC: 14.4 10*3/uL — ABNORMAL HIGH (ref 3.6–11.0)

## 2017-12-17 LAB — BASIC METABOLIC PANEL
Anion gap: 8 (ref 5–15)
BUN: 9 mg/dL (ref 6–20)
CHLORIDE: 113 mmol/L — AB (ref 101–111)
CO2: 18 mmol/L — AB (ref 22–32)
Calcium: 8.2 mg/dL — ABNORMAL LOW (ref 8.9–10.3)
Creatinine, Ser: 0.5 mg/dL (ref 0.44–1.00)
GFR calc Af Amer: >60 mL/min (ref >60)
GLUCOSE: 71 mg/dL (ref 65–99)
POTASSIUM: 3.4 mmol/L — AB (ref 3.5–5.1)
Sodium: 139 mmol/L (ref 135–145)

## 2017-12-17 LAB — SURGICAL PATHOLOGY

## 2017-12-17 MED ORDER — CEFDINIR 300 MG PO CAPS
300.0000 mg | ORAL_CAPSULE | Freq: Two times a day (BID) | ORAL | Status: DC
  Administered 2017-12-17 – 2017-12-19 (×4): 300 mg via ORAL
  Filled 2017-12-17 (×5): qty 1

## 2017-12-17 MED ORDER — AMOXICILLIN-POT CLAVULANATE 875-125 MG PO TABS
1.0000 | ORAL_TABLET | Freq: Two times a day (BID) | ORAL | Status: DC
  Filled 2017-12-17: qty 1

## 2017-12-17 MED ORDER — ENOXAPARIN SODIUM 40 MG/0.4ML ~~LOC~~ SOLN
40.0000 mg | SUBCUTANEOUS | Status: DC
  Administered 2017-12-17 – 2017-12-18 (×2): 40 mg via SUBCUTANEOUS
  Filled 2017-12-17 (×2): qty 0.4

## 2017-12-17 MED ORDER — DIPHENOXYLATE-ATROPINE 2.5-0.025 MG PO TABS
1.0000 | ORAL_TABLET | Freq: Once | ORAL | Status: AC
  Administered 2017-12-17: 1 via ORAL
  Filled 2017-12-17: qty 1

## 2017-12-17 MED ORDER — BUDESONIDE 3 MG PO CPEP
3.0000 mg | ORAL_CAPSULE | Freq: Every day | ORAL | Status: DC
  Administered 2017-12-17 – 2017-12-18 (×2): 3 mg via ORAL
  Filled 2017-12-17 (×2): qty 1

## 2017-12-17 MED ORDER — POTASSIUM CHLORIDE CRYS ER 20 MEQ PO TBCR
40.0000 meq | EXTENDED_RELEASE_TABLET | Freq: Once | ORAL | Status: AC
  Administered 2017-12-17: 40 meq via ORAL
  Filled 2017-12-17: qty 2

## 2017-12-17 MED ORDER — RISAQUAD PO CAPS
1.0000 | ORAL_CAPSULE | Freq: Two times a day (BID) | ORAL | Status: DC
  Administered 2017-12-17 – 2017-12-19 (×5): 1 via ORAL
  Filled 2017-12-17 (×5): qty 1

## 2017-12-17 NOTE — Progress Notes (Signed)
Chaplain responded to a referrial by nurse. Pt and son Melissa [name]) was in the room. Pt was alert and responsive. Chaplain asked about Pt needs and there werwe none. Chaplain prayed for family and healing. Chaplain will follow up.    12/17/17 1500  Clinical Encounter Type  Visited With Patient and family together  Visit Type Follow-up  Referral From Nurse  Spiritual Encounters  Spiritual Needs Prayer

## 2017-12-17 NOTE — Progress Notes (Signed)
[facility]  Daily Progress Note   Subjective  - 5 Days Post-Op  Patient is awake and alert she is sitting up in bed.  She denies pain.  Objective Vitals:   12/16/17 1558 12/16/17 1932 12/17/17 0513 12/17/17 0758  BP: (!) 153/74 136/65 (!) 137/49 (!) 131/51  Pulse: 75 62 63 68  Resp:  18 18 18   Temp: (!) 97.5 F (36.4 C) 98.7 F (37.1 C) 98.5 F (36.9 C) 98.3 F (36.8 C)  TempSrc: Oral Oral  Axillary  SpO2:  99% 96% 97%  Weight:      Height:        Intake/Output Summary (Last 24 hours) at 12/17/2017 1729 Last data filed at 12/17/2017 1700 Gross per 24 hour  Intake 1310 ml  Output 154 ml  Net 1156 ml    PULM  Normal effort , no use of accessory muscles CV  No JVD, RRR Abd      No distended, nontender VASC  left below-knee amputation knee is straight dressing is dry  Laboratory CBC    Component Value Date/Time   WBC 14.4 (H) 12/17/2017 0415   HGB 8.7 (L) 12/17/2017 0415   HGB 11.6 (L) 06/16/2013 1031   HCT 25.7 (L) 12/17/2017 0415   HCT 34.8 (L) 06/16/2013 1031   PLT 279 12/17/2017 0415   PLT 226 06/16/2013 1031    BMET    Component Value Date/Time   NA 139 12/17/2017 0415   NA 139 06/16/2013 1031   K 3.4 (L) 12/17/2017 0415   K 3.8 06/16/2013 1031   CL 113 (H) 12/17/2017 0415   CL 105 06/16/2013 1031   CO2 18 (L) 12/17/2017 0415   CO2 30 06/16/2013 1031   GLUCOSE 71 12/17/2017 0415   GLUCOSE 96 06/16/2013 1031   BUN 9 12/17/2017 0415   BUN 23 (H) 06/16/2013 1031   CREATININE 0.50 12/17/2017 0415   CREATININE 0.58 (L) 06/16/2013 1031   CALCIUM 8.2 (L) 12/17/2017 0415   CALCIUM 9.7 06/16/2013 1031   GFRNONAA >60 12/17/2017 0415   GFRNONAA >60 06/16/2013 1031   GFRAA >60 12/17/2017 0415   GFRAA >60 06/16/2013 1031    Assessment/Planning: POD #5 s/p left below-knee amputation   Patient [facility].  I will plan for discharged either tomorrow or Wednesday.    [name]  12/17/2017, 5:29  PM

## 2017-12-17 NOTE — NC FL2 (Signed)
[place] LEVEL OF CARE SCREENING TOOL     IDENTIFICATION  Patient Name: Melissa [name] Birthdate: December 20, 1940 Sex: female Admission Date (Current Location): 12/05/2017  [place] and [place] Number:  Engineering geologist and Address:  [facility], [address] [place], [place] [address]      Provider Number: [id number]  Attending Physician Name and Address:  Katha Cabal, MD  Relative Name and Phone Number:  Richardean Chimera Daughter [phone] or Regal,Charles Spouse   [phone] or Vasey,Dawn Daughter   [phone]     Current Level of Care: Hospital Recommended Level of Care: [place] Prior Approval Number:    Date Approved/Denied:   PASRR Number: [id number]  Discharge Plan: SNF    Current Diagnoses: Patient Active Problem List   Diagnosis Date Noted  . 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 Ischemic pain of left foot 12/05/2017  . Atherosclerosis of native arteries of the extremities with ulceration ([place]) 11/18/2017  . 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 Essential hypertension 11/18/2017  . Pure hypercholesterolemia 11/18/2017  . GERD (gastroesophageal reflux disease) 11/18/2017  . Chronic mesenteric ischemia ([place]) 11/18/2017  . Colitis 04/19/2017  . UTI due to extended-spectrum beta lactamase (ESBL) producing Escherichia coli 04/19/2017  . Other secondary osteoarthritis of one knee 04/04/2016    Orientation RESPIRATION BLADDER Height & Weight     Self, Time, Situation, Place  Normal Incontinent Weight: 122 lb (55.3 kg) Height:  5' 4"  (162.6 cm)  BEHAVIORAL SYMPTOMS/MOOD NEUROLOGICAL BOWEL NUTRITION STATUS      Incontinent Diet(Cardiac diet)  AMBULATORY STATUS COMMUNICATION OF NEEDS Skin   Limited Assist Verbally Surgical wounds                       Personal Care Assistance Level of Assistance  Bathing, Feeding, Dressing Bathing Assistance: Limited  assistance Feeding assistance: Independent Dressing Assistance: Limited assistance     Functional Limitations Info  Sight, Hearing, Speech Sight Info: Adequate Hearing Info: Adequate Speech Info: Adequate    SPECIAL CARE FACTORS FREQUENCY  PT (By licensed PT), OT (By licensed OT)     PT Frequency: 5x a week OT Frequency: 5x a week            Contractures Contractures Info: Not present    Additional Factors Info  Code Status, Allergies, Psychotropic, Isolation Precautions Code Status Info: Full Code Allergies Info: LODINE ETODOLAC, METHOTREXATE DERIVATIVES, NITROFURAN DERIVATIVES, NORVASC AMLODIPINE, PENTASA MESALAMINE ER, RED YEAST RICE EXTRACT, VIOXX ROFECOXIB, LEVAQUIN LEVOFLOXACIN, SULFA ANTIBIOTICS  Psychotropic Info: sertraline (ZOLOFT) tablet 50 mg    Isolation Precautions Info: History of MRSA and EColi     Current Medications (12/17/2017):  This is the current hospital active medication list Current Facility-Administered Medications  Medication Dose Route Frequency Provider Last Rate Last Dose  . 0.9 %  sodium chloride infusion  250 mL Intravenous PRN Schnier, Dolores Lory, MD      . 0.9 %  sodium chloride infusion   Intravenous Continuous Soyla Murphy, Maged, MD 50 mL/hr at 12/16/17 1812    . acetaminophen (TYLENOL) tablet 650 mg  650 mg Oral Q6H PRN Gladstone Lighter, MD      . acidophilus (RISAQUAD) capsule 1 capsule  1 capsule Oral BID Gladstone Lighter, MD      . aspirin EC tablet 81 mg  81 mg Oral Daily Schnier, Dolores Lory, MD   81 mg at 12/17/17 0911  . budesonide (ENTOCORT EC) 24  hr capsule 3 mg  3 mg Oral Daily Gladstone Lighter, MD      . cefTRIAXone (ROCEPHIN) 1 g in sodium chloride 0.9 % 100 mL IVPB  1 g Intravenous Q24H Gladstone Lighter, MD   Stopped at 12/16/17 1547  . Chlorhexidine Gluconate Cloth 2 % PADS 6 each  6 each Topical Q0600 Cassandria Santee, MD   6 each at 12/17/17 0513  . clopidogrel (PLAVIX) tablet 75 mg  75 mg Oral Daily Schnier, Dolores Lory, MD    75 mg at 12/17/17 0912  . diphenoxylate-atropine (LOMOTIL) 2.5-0.025 MG per tablet 1 tablet  1 tablet Oral Once Gladstone Lighter, MD      . docusate sodium (COLACE) capsule 100 mg  100 mg Oral Daily Schnier, Dolores Lory, MD   100 mg at 12/16/17 0931  . feeding supplement (BOOST / RESOURCE BREEZE) liquid 1 Container  1 Container Oral TID BM Schnier, Dolores Lory, MD   1 Container at 12/17/17 [phone]  . ferrous sulfate tablet 325 mg  325 mg Oral Q breakfast Schnier, Dolores Lory, MD   325 mg at 12/17/17 0809  . gabapentin (NEURONTIN) capsule 300 mg  300 mg Oral Daily Schnier, Dolores Lory, MD   300 mg at 12/17/17 0911  . gemfibrozil (LOPID) tablet 600 mg  600 mg Oral BID AC Schnier, Dolores Lory, MD   600 mg at 12/17/17 8527  . hydroxychloroquine (PLAQUENIL) tablet 200 mg  200 mg Oral BID Delana Meyer Dolores Lory, MD   200 mg at 12/17/17 0911  . ketorolac (TORADOL) 15 MG/ML injection 15 mg  15 mg Intravenous Q6H PRN Gladstone Lighter, MD   15 mg at 12/15/17 2051  . leflunomide (ARAVA) tablet 20 mg  20 mg Oral Daily Schnier, Dolores Lory, MD   20 mg at 12/17/17 0911  . loperamide (IMODIUM) capsule 2 mg  2 mg Oral PRN Gladstone Lighter, MD   2 mg at 12/16/17 2114  . LORazepam (ATIVAN) tablet 1 mg  1 mg Oral Q6H PRN Harrie Foreman, MD   1 mg at 12/16/17 2115  . multivitamin with minerals tablet 1 tablet  1 tablet Oral Daily Schnier, Dolores Lory, MD   1 tablet at 12/17/17 0911  . mupirocin ointment (BACTROBAN) 2 % 1 application  1 application Nasal BID Cassandria Santee, MD   1 application at 78/24/23 0912  . ondansetron (ZOFRAN) injection 4 mg  4 mg Intravenous Q6H PRN Schnier, Dolores Lory, MD   4 mg at 12/10/17 2111  . pantoprazole (PROTONIX) EC tablet 40 mg  40 mg Oral Daily Schnier, Dolores Lory, MD   40 mg at 12/17/17 0911  . polyethylene glycol (MIRALAX / GLYCOLAX) packet 17 g  17 g Oral Daily PRN Schnier, Dolores Lory, MD   17 g at 12/13/17 1852  . pravastatin (PRAVACHOL) tablet 80 mg  80 mg Oral QHS Schnier, Dolores Lory, MD   80  mg at 12/16/17 2115  . predniSONE (DELTASONE) tablet 5 mg  5 mg Oral Q breakfast Schnier, Dolores Lory, MD   5 mg at 12/17/17 0809  . sertraline (ZOLOFT) tablet 50 mg  50 mg Oral QHS Schnier, Dolores Lory, MD   50 mg at 12/16/17 2114  . sodium chloride flush (NS) 0.9 % injection 3 mL  3 mL Intravenous Q12H Schnier, Dolores Lory, MD   3 mL at 12/17/17 0912  . sodium chloride flush (NS) 0.9 % injection 3 mL  3 mL Intravenous PRN Schnier, Dolores Lory, MD      .  tiZANidine (ZANAFLEX) tablet 4 mg  4 mg Oral Daily PRN Schnier, Dolores Lory, MD   4 mg at 12/16/17 0932  . traMADol (ULTRAM) tablet 50 mg  50 mg Oral Q6H PRN Gladstone Lighter, MD   50 mg at 12/16/17 6190     Discharge Medications: Please see discharge summary for a list of discharge medications.  Relevant Imaging Results:  Relevant Lab Results:   Additional Information SS# [id number]  Anell Barr

## 2017-12-17 NOTE — Clinical Social Work Note (Signed)
CSW spoke to patient to discuss short term rehab and presented bed offers to patient she chose [facility].  CSW contacted [facility], and they will begin insurance authorization, CSW to continue to follow patient's progress throughout discharge planning, formal assessment to follow.  Jones Broom. [place], MSW, [place]  12/17/2017 12:00 PM

## 2017-12-17 NOTE — Progress Notes (Signed)
[place] [facility] [place] NAME: Melissa [name]    MR#:  [id number]  DATE OF BIRTH:  1941/03/28  SUBJECTIVE:  CHIEF COMPLAINT:  No chief complaint on file.  -Doing well, pain is better controlled.  Complains of diarrhea  REVIEW OF SYSTEMS:  Review of Systems  Constitutional: Negative for chills, fever and malaise/fatigue.  HENT: Negative for congestion, ear discharge, hearing loss and nosebleeds.   Eyes: Negative for blurred vision and double vision.  Respiratory: Negative for cough, shortness of breath and wheezing.   Cardiovascular: Negative for chest pain and palpitations.  Gastrointestinal: Positive for diarrhea. Negative for abdominal pain, constipation, nausea and vomiting.  Genitourinary: Negative for dysuria.  Musculoskeletal: Positive for myalgias.  Neurological: Negative for dizziness, seizures and headaches.  Psychiatric/Behavioral: Negative for depression.    DRUG ALLERGIES:   Allergies  Allergen Reactions  . Lodine [Etodolac] Hives and Itching  . Methotrexate Derivatives Other (See Comments)    Flu like symptoms  . Nitrofuran Derivatives Nausea And Vomiting and Other (See Comments)    Made sicker   . Norvasc [Amlodipine] Other (See Comments)    Unknown   . Pentasa [Mesalamine Er] Other (See Comments)    Unknown   . Red Yeast Rice Extract Other (See Comments)    Patient preference   . Vioxx [Rofecoxib] Other (See Comments)    Unknown   . Levaquin [Levofloxacin] Itching, Rash and Other (See Comments)    Muscle pain  . Sulfa Antibiotics Itching and Rash    VITALS:  Blood pressure (!) 131/51, pulse 68, temperature 98.3 F (36.8 C), temperature source Axillary, resp. rate 18, height 5' 4"  (1.626 m), weight 55.3 kg (122 lb), SpO2 97 %.  PHYSICAL EXAMINATION:  Physical Exam  GENERAL:  77 y.o.-year-old patient lying in the bed with no acute distress.  EYES: Pupils equal, round, reactive to light and accommodation. No  scleral icterus. Extraocular muscles intact.  HEENT: Head atraumatic, normocephalic. Oropharynx and nasopharynx clear.  NECK:  Supple, no jugular venous distention. No thyroid enlargement, no tenderness.  LUNGS: Normal breath sounds bilaterally, no wheezing, rales,rhonchi or crepitation. No use of accessory muscles of respiration. Decreased bibasilar breath sounds CARDIOVASCULAR: S1, S2 normal. No murmurs, rubs, or gallops.  ABDOMEN: Soft, nontender, nondistended. Bowel sounds present. No organomegaly or mass.  EXTREMITIES: No  cyanosis, or clubbing. S/p left BKA NEUROLOGIC: Cranial nerves II through XII are intact. Muscle strength 5/5 in all extremities. Sensation intact. Gait not checked.  PSYCHIATRIC: The patient is sleeping, easily arousable. alert and oriented x 3.  SKIN: No obvious rash, lesion, or ulcer.    LABORATORY PANEL:   CBC Recent Labs  Lab 12/17/17 0415  WBC 14.4*  HGB 8.7*  HCT 25.7*  PLT 279   ------------------------------------------------------------------------------------------------------------------  Chemistries  Recent Labs  Lab 12/14/17 1958  12/16/17 0556 12/17/17 0415  NA 137   < > 138 139  K 4.0   < > 3.8 3.4*  CL 106   < > 113* 113*  CO2 19*   < > 18* 18*  GLUCOSE 132*   < > 111* 71  BUN 35*   < > 13 9  CREATININE 0.74   < > 0.40* 0.50  CALCIUM 7.8*   < > 7.9* 8.2*  MG 1.8   < > 2.0  --   AST 58*  --   --   --   ALT 23  --   --   --  ALKPHOS 71  --   --   --   BILITOT 0.5  --   --   --    < > = values in this interval not displayed.   ------------------------------------------------------------------------------------------------------------------  Cardiac Enzymes Recent Labs  Lab 12/15/17 0426  TROPONINI 0.06*   ------------------------------------------------------------------------------------------------------------------  RADIOLOGY:  No results found.  EKG:   Orders placed or performed during the hospital encounter of  12/05/17  . EKG 12-Lead  . EKG 12-Lead    ASSESSMENT AND PLAN:   77 y/o with past medical history significant for hypertension, arthritis, rainouts disease, peripheral arterial disease, COPD not on home oxygen, GERD presents to hospital secondary to left foot gangrene and had BKA -Medical consult requested for postoperative sepsis  1.  Sepsis-likely from postoperative pneumonia -IV fluid resuscitation, was in ICU for Levophed. -Off Levophed now,  -WBC slowly improving.  Urine analysis is pending -Chest x-ray with bibasilar infiltrates. -antibiotics were changed to Rocephin yesterday.  Change to oral Augmentin- continue for 5 more days  2.  Hypokalemia -replaced appropriately  3.  Peripheral arterial disease-management per vascular.  Status post left BKA -Aspirin and Plavix -Pain medications with gabapentin and tramadol  4.  Diarrhea-could be from underlying colitis.  Her Entocort has been held on admission.  Restarted Entocort. -C. difficile PCR is negative.  Could also be from antibiotics. -Probiotics added.  Imodium as needed  5.  Acute postoperative anemia-hemoglobin dropped from baseline of 9 to 6.7.   -Received 1 unit packed RBC and stable now -Continue to monitor and transfuse as needed  6.  DVT prophylaxis-teds and SCDs for now  7. Elevated troponin- stable, appreciate cardiology consult.  Demand ischemia.  PT consult- will need rehab at discharge   All the records are reviewed and case discussed with Care Management/Social Workerr. Management plans discussed with the patient, family and they are in agreement.  CODE STATUS: Full Code  TOTAL TIME TAKING CARE OF THIS PATIENT: 33 minutes.   POSSIBLE D/C IN 1-2 DAYS, DEPENDING ON CLINICAL CONDITION.   Gladstone Lighter M.D on 12/17/2017 at 10:40 AM  Between 7am to 6pm - Pager - [phone]  After 6pm go to [email or web address] - password [email or web address] [facility]  Office  [phone]  CC: Primary  care physician; [name], MD

## 2017-12-17 NOTE — Plan of Care (Signed)
  Problem: Clinical Measurements: Goal: Ability to maintain clinical measurements within normal limits will improve Outcome: Not Progressing Note:  Potassium level = only 3.4 today. Will continue to monitor lab values. Wenda Low [facility]

## 2017-12-17 NOTE — Progress Notes (Signed)
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  Pt had infection and PVD on LLE, with complicating PAF and elevated troponin.  Had postop PNA and sepsis as well as AMS. PMHx:  LLE gangrene, COPD, back pain, raynauds, OA, RA, falls, HTN,  SOB with exertion    PT Comments    Pt demonstrates significant improvement in performance today with therapist. She is modified independent for bed mobility and modA+1 for sit to stand transfers. Once in standing requires minA+1 to balance. Cues for safe hand placement and proper anterior weight shifting during transfer. Pt able to perform standing pivot transfer with modA+1 from therapist from bed to recliner. She is able to complete all supine exercises as instructed today. Pt provided education about prone positioning intermittently as well as distal LLE support to promote knee extension. Encouraged to avoid L hip ER when resting. Pt will benefit from PT services to address deficits in strength, balance, and mobility in order to return to full function at home.   Follow Up Recommendations  SNF     Equipment Recommendations  None recommended by PT(will allow her venue to decide equipment needs)    Recommendations for Other Services       Precautions / Restrictions Precautions Precautions: Fall(telemetry, contact) Restrictions Weight Bearing Restrictions: Yes LLE Weight Bearing: Non weight bearing    Mobility  Bed Mobility Overal bed mobility: Modified Independent Bed Mobility: Supine to Sit     Supine to sit: Modified independent (Device/Increase time)     General bed mobility comments: HOB elevated and increased time but pt able  to complete without external assist from therapist. Pt utilizes bed rails. Steady once upright in sitting  Transfers Overall transfer level: Needs assistance Equipment used: Rolling walker (2 wheeled);1 person hand held assist Transfers: Sit to/from Stand Sit to Stand: Mod assist         General transfer comment: Pt able to stand at EOB with modA+1 to come upright. Once in standing requires minA+1 to balance. Cues for safe hand placement and proper anterior weight shifting for transfer. Pt able to perform standing pivot transfer with modA+1 from therapist from bed to recliner  Ambulation/Gait             General Gait Details: Unable to perform at this time   Stairs             Wheelchair Mobility    Modified Rankin (Stroke Patients Only)       Balance Overall balance assessment: Needs assistance Sitting-balance support: Feet supported;Bilateral upper extremity supported Sitting balance-Leahy Scale: Fair     Standing balance support: Bilateral upper extremity supported;During functional activity Standing balance-Leahy Scale: Poor Standing balance comment: Requires assist to remain upright in standing                            Cognition Arousal/Alertness: Awake/alert Behavior During Therapy: WFL for tasks assessed/performed Overall Cognitive Status: Within Functional Limits for tasks assessed                                 General Comments:  AOx4 at time of PT treatment      Exercises General Exercises - Lower Extremity Ankle Circles/Pumps: AROM;Supine;Right;15 reps Quad Sets: Both;15 reps;Supine Gluteal Sets: Both;10 reps;Supine Long Arc Quad: Seated;Both;15 reps Heel Slides: Right;15 reps Hip ABduction/ADduction: Both;15 reps;Supine Straight Leg Raises: Both;15 reps;Supine Hip Flexion/Marching: Both;15 reps;Seated    General Comments        Pertinent Vitals/Pain Pain Assessment: No/denies pain Pain Descriptors /  Indicators: Aching Pain Intervention(s): Monitored during session    Home Living                      Prior Function            PT Goals (current goals can now be found in the care plan section) Acute Rehab PT Goals Patient Stated Goal: none stated PT Goal Formulation: With patient/family Time For Goal Achievement: 12/30/17 Potential to Achieve Goals: Fair Progress towards PT goals: Progressing toward goals    Frequency    7X/week      PT Plan Frequency needs to be updated    Co-evaluation              AM-PAC PT "6 Clicks" Daily Activity  Outcome Measure  Difficulty turning over in bed (including adjusting bedclothes, sheets and blankets)?: A Little Difficulty moving from lying on back to sitting on the side of the bed? : A Little Difficulty sitting down on and standing up from a chair with arms (e.g., wheelchair, bedside commode, etc,.)?: Unable Help needed moving to and from a bed to chair (including a wheelchair)?: A Lot Help needed walking in hospital room?: Total Help needed climbing 3-5 steps with a railing? : Total 6 Click Score: 11    End of Session Equipment Utilized During Treatment: Gait belt Activity Tolerance: Patient tolerated treatment well Patient left: in bed;with call bell/phone within reach;with bed alarm set;with family/visitor present Nurse Communication: Mobility status;Other (comment)(Mobility needs communicated with CNA) PT Visit Diagnosis: Unsteadiness on feet (R26.81);Muscle weakness (generalized) (M62.81);Difficulty in walking, not elsewhere classified (R26.2);History of falling (Z91.81) Pain - Right/Left: Left Pain - part of body: Leg     Time: 1287-8676 PT Time Calculation (min) (ACUTE ONLY): 30 min  Charges:  $Gait Training: 8-22 mins $Therapeutic Activity: 8-22 mins                    G Codes:       Lyndel Safe Rayden Dock PT, DPT     Rae Plotner 12/17/2017, 12:49 PM

## 2017-12-17 NOTE — Evaluation (Signed)
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  Pt had infection and PVD on LLE, with complicating PAF and elevated troponin.  Had postop PNA and sepsis as well as AMS. PMHx:  LLE gangrene, COPD, back pain, raynauds, OA, RA, falls, HTN,  SOB with exertion   Clinical Impression   Pt seen for OT re-evaluation this date, POD#5 from L BKA. Prior to hospital admission, pt was independent with ADL, IADL including driving, and mobility (recently "hopping around" due to L foot pain with weight bearing). Pt lives with her spouse and small dog. Currently pt demonstrates greater impairments in strength, pain, and balance requiring min assist for LB ADL and mod assist for transfers. Pt reports she is "doing better" in regards to emotional/mental well being following the amputation.  Pt instructed in modifications to meaningful occupations at home to improve independence, maximize safety, and minimize risk of injury/falls. For example, pt enjoys gardening and pt educated in benefits of a raised garden bed to support participation. Pt instructed in residual limb desensitization techniques and schedule for performing to minimize phantom pain/discomfort. Pt also educated in home modifications to improve safety and independence with mobility and ADL. Pt verbalized understanding. Pt continues to benefit from skilled OT services to address noted impairments and functional limitations (see below for any additional details) in order to maximize safety and independence while minimizing falls risk and caregiver burden. Due to functional changes in status since the BKA, pt now appropriate for STR to continue to  address functional ADL goals, self mgt of residual limb, and safety with mobility.     Follow Up Recommendations  SNF    Equipment Recommendations  Toilet rise with handles    Recommendations for Other Services       Precautions / Restrictions Precautions Precautions: Fall(telemetry, contact) Restrictions Weight Bearing Restrictions: Yes LLE Weight Bearing: Non weight bearing      Mobility Bed Mobility Overal bed mobility: Modified Independent Bed Mobility: Supine to Sit;Sit to Supine     Supine to sit: Modified independent (Device/Increase time);HOB elevated Sit to supine: Modified independent (Device/Increase time)   General bed mobility comments: pt steady, use of rails to assist  Transfers Overall transfer level: Needs assistance Equipment used: Rolling walker (2 wheeled) Transfers: Sit to/from Stand Sit to Stand: Mod assist              Balance Overall balance assessment: Needs assistance Sitting-balance support: Feet supported;Bilateral upper extremity supported Sitting balance-Leahy Scale: Fair     Standing balance support: Bilateral upper extremity supported;During functional activity Standing balance-Leahy Scale: Poor                             ADL either performed or assessed with clinical judgement   ADL Overall ADL's : Needs assistance/impaired Eating/Feeding: Independent;Set up;Sitting   Grooming: Sitting;Set up   Upper Body Bathing: Supervision/ safety;Set up;Sitting   Lower Body Bathing: Set up;Minimal assistance;Sitting/lateral leans   Upper Body Dressing : Sitting;Set up;Min guard   Lower Body Dressing: Sitting/lateral leans;Minimal assistance Lower Body Dressing Details (indicate cue type and reason): Pt instructed in AE for LB dressing, types of clothes to improve  comfort to LLE residual limb Toilet Transfer: BSC;Stand-pivot;Minimal assistance                   Vision Patient Visual Report: No change from  baseline       Perception     Praxis      Pertinent Vitals/Pain Pain Assessment: 0-10 Pain Score: 2  Pain Location: 2 at rest, up to 7/10 with mobility Pain Descriptors / Indicators: Aching Pain Intervention(s): Limited activity within patient's tolerance;Monitored during session;Premedicated before session;Repositioned     Hand Dominance Right   Extremity/Trunk Assessment Upper Extremity Assessment Upper Extremity Assessment: Generalized weakness(at least 4/5 bilaterally, arthritis in shoulders/hands bilat)   Lower Extremity Assessment Lower Extremity Assessment: Defer to PT evaluation;Generalized weakness;LLE deficits/detail LLE Deficits / Details: LLE BKA LLE: Unable to fully assess due to pain       Communication Communication Communication: No difficulties   Cognition Arousal/Alertness: Awake/alert Behavior During Therapy: WFL for tasks assessed/performed Overall Cognitive Status: Within Functional Limits for tasks assessed                                     General Comments       Exercises Other Exercises Other Exercises: Pt instructed in residual limb desensitization techniques and schedule for performing to minimize phantom pain/discomfort. Pt verbalized understanding.  Other Exercises: Pt instructed in modifications to meaningful occupations at home to improve independence, maximize safety, and minimize risk of injury/falls. For example, pt enjoys gardening and pt educated in benefits of a raised garden bed to support participation  Other Exercises: Pt educated in home mods including toilet risers that would be beneficial to improve safety with transfers    Shoulder Instructions      Home Living Family/patient expects to be discharged to:: Private residence Living Arrangements: Spouse/significant other Available Help at Discharge: Family;Available 24 hours/day Type of Home: House Home Access: Stairs to enter [employer] of  Steps: 1 Entrance Stairs-Rails: None Home Layout: One level     Bathroom Shower/Tub: Walk-in Hydrologist: Standard     Home Equipment: Cane - single point;Bedside commode;Shower seat;Walker - 2 wheels;Grab bars - tub/shower;Hand held shower head;Adaptive equipment Adaptive Equipment: Reacher Additional Comments: Pt lives with husband who cannot provide physical assist but will be able to assist with providing meals and bringing pt anything she needs.  Two children live very close and will be able to do the grocery shopping and running errands.       Prior Functioning/Environment Level of Independence: Independent with assistive device(s)        Comments: has been using RW with minimal WB on LLE due to pain, indep with ADL        OT Problem List: Decreased strength;Impaired balance (sitting and/or standing);Pain;Decreased activity tolerance;Decreased knowledge of use of DME or AE      OT Treatment/Interventions: Self-care/ADL training;Therapeutic exercise;Therapeutic activities;DME and/or AE instruction;Balance training;Patient/family education    OT Goals(Current goals can be found in the care plan section) Acute Rehab OT Goals Patient Stated Goal: to recover and get back to my life OT Goal Formulation: With patient Time For Goal Achievement: 12/31/17 Potential to Achieve Goals: Good ADL Goals Pt Will Transfer to Toilet: with min guard assist;stand pivot transfer(w/c <> BSC over toilet) Additional ADL Goal #1: Pt will demo proper residual limb wrapping technique to support safe recovery s/p LLE BKA.  OT Frequency: Min 2X/week  Barriers to D/C:            Co-evaluation              AM-PAC PT "6 Clicks" Daily Activity     Outcome Measure Help from another person eating meals?: None Help from another person taking care of personal grooming?: A Little Help from another person toileting, which includes using toliet, bedpan, or urinal?: A  Lot Help from another person bathing (including washing, rinsing, drying)?: A Little Help from another person to put on and taking off regular upper body clothing?: A Little Help from another person to put on and taking off regular lower body clothing?: A Little 6 Click Score: 18   End of Session    Activity Tolerance: Patient tolerated treatment well Patient left: in bed;with call bell/phone within reach;with bed alarm set  OT Visit Diagnosis: Other abnormalities of gait and mobility (R26.89);Muscle weakness (generalized) (M62.81);Pain Pain - Right/Left: Left Pain - part of body: Leg                Time: 1901-2224 OT Time Calculation (min): 32 min Charges:  OT General Charges $OT Visit: 1 Visit OT Evaluation $OT Re-eval: 1 Re-eval OT Treatments $Self Care/Home Management : 8-22 mins  Jeni Salles, MPH, MS, OTR/L ascom [phone] 12/17/17, 2:26 PM

## 2017-12-17 NOTE — Clinical Social Work Note (Signed)
Clinical Social Work Assessment  Patient Details  Name: Melissa [name] MRN: [id number] Date of Birth: 03-08-1941  Date of referral:  12/17/17               Reason for consult:  Facility Placement                Permission sought to share information with:  Family Supports, Customer service manager Permission granted to share information::  Yes, Verbal Permission Granted  Name::     Melissa [name] Daughter [phone] or Melissa [name],Melissa [name] Spouse   [phone] or   Agency::  SNF admissions  Relationship::     Contact Information:     Housing/Transportation Living arrangements for the past 2 months:  Single Family Home Source of Information:  Patient Patient Interpreter Needed:  None Criminal Activity/Legal Involvement Pertinent to Current Situation/Hospitalization:  No - Comment as needed Significant Relationships:  Adult Children Lives with:  Spouse Do you feel safe going back to the place where you live?  No Need for family participation in patient care:  No (Coment)  Care giving concerns:  Patient feels she needs short term rehab before she is able to return back home.   Social Worker assessment / plan:  Patient is a 77 year old female who is married and lives with her husband, patient is alert and oriented x4.  Patient states she has been to rehab before at [facility].  CSW discussed with patient how insurance will pay for stay and what to expect day of discharge.  CSW explained the role and process of trying to find placement for patient.  Patient was familiar with what to expect at SNF and also the process of discharging to SNF from hospital, then from SNF to home with home health.  Patient expressed that she is used to doing many things on her own.  Patient did not express any other questions or concerns and she gave CSW permission to begin bed search in [facility].    Employment status:  Retired Nurse, adult PT Recommendations:   [place] / Referral to community resources:     Patient/Family's Response to care:  Patient is motivated and in agreement to going to SNF for short term rehab.  Patient/Family's Understanding of and Emotional Response to Diagnosis, Current Treatment, and Prognosis: Patient expressed that she is hopeful that she will not have to be in SNF for very long.  Emotional Assessment Appearance:  Appears stated age Attitude/Demeanor/Rapport:    Affect (typically observed):  Appropriate Orientation:  Oriented to Self, Oriented to Place, Oriented to  Time, Oriented to Situation Alcohol / Substance use:  Not Applicable Psych involvement (Current and /or in the community):  No (Comment)  Discharge Needs  Concerns to be addressed:  Lack of Support, Care Coordination Readmission within the last 30 days:  No Current discharge risk:  Lack of support system Barriers to Discharge:  Continued Medical Work up, [employer]   Ross Ludwig, [place] 12/17/2017, 3:24 PM

## 2017-12-18 LAB — CALCIUM, IONIZED: Calcium, Ionized, Serum: 4.7 mg/dL (ref 4.5–5.6)

## 2017-12-18 MED ORDER — BUDESONIDE 3 MG PO CPEP
9.0000 mg | ORAL_CAPSULE | Freq: Every day | ORAL | Status: DC
  Administered 2017-12-19: 9 mg via ORAL
  Filled 2017-12-18: qty 3

## 2017-12-18 MED ORDER — CEFDINIR 300 MG PO CAPS: 300.0000 mg | ORAL_CAPSULE | Freq: Two times a day (BID) | ORAL | 0 refills | Status: DC

## 2017-12-18 MED ORDER — BUDESONIDE 3 MG PO CPEP: 9.0000 mg | ORAL_CAPSULE | Freq: Every day | ORAL | 0 refills | Status: AC

## 2017-12-18 MED ORDER — OXYCODONE-ACETAMINOPHEN 5-325 MG PO TABS: 1.0000 | ORAL_TABLET | Freq: Four times a day (QID) | ORAL | 0 refills | Status: DC | PRN

## 2017-12-18 MED ORDER — BUDESONIDE 3 MG PO CPEP
6.0000 mg | ORAL_CAPSULE | Freq: Once | ORAL | Status: AC
  Administered 2017-12-18: 6 mg via ORAL
  Filled 2017-12-18: qty 2

## 2017-12-18 NOTE — Clinical Social Work Note (Signed)
CSW received phone call that [facility] has received insurance authorization, and they can accept patient today if she is medically ready for discharge and orders have been received.  Jones Broom. [place], MSW, [place]  12/18/2017 9:50 AM

## 2017-12-18 NOTE — Progress Notes (Signed)
Family insisting that patient needs to be taking 3 capsules (75m) of budesonide because of her GI flare up with diarrhea. Spoke to Dr. KTressia Miners She will change order to start tomorrow. Instructed to go ahead and give additional 663mnow to make up for missed dose.

## 2017-12-18 NOTE — Progress Notes (Signed)
Patient approved to go to [facility] per Randall Hiss CSW. Paged Dr. Delana Meyer to inform. Waiting for call back.

## 2017-12-18 NOTE — Progress Notes (Signed)
[facility]  Daily Progress Note   Subjective  - 6 Days Post-Op  Patient decided that she did not want to go to [facility] today because it was later in the afternoon and she would be charged [facility] a day at [facility] and she would not have gotten a day at [facility] she found this somewhat upsetting  Objective Vitals:   12/18/17 0500 12/18/17 0822 12/18/17 1704 12/18/17 1943  BP: 138/71 136/64 (!) 141/71 (!) 148/74  Pulse: 73 76 65 68  Resp: 18 16 17 18   Temp: 98.4 F (36.9 C) 98.5 F (36.9 C) 98.5 F (36.9 C) 98.3 F (36.8 C)  TempSrc: Oral Oral Oral Oral  SpO2: 100% 99% 97% 100%  Weight:      Height:        Intake/Output Summary (Last 24 hours) at 12/18/2017 2016 Last data filed at 12/18/2017 1530 Gross per 24 hour  Intake 825 ml  Output 352 ml  Net 473 ml    PULM  Normal effort , no use of accessory muscles CV  No JVD, RRR Abd      No distended, nontender VASC  Left BKA site CD&I Laboratory CBC    Component Value Date/Time   WBC 14.4 (H) 12/17/2017 0415   HGB 8.7 (L) 12/17/2017 0415   HGB 11.6 (L) 06/16/2013 1031   HCT 25.7 (L) 12/17/2017 0415   HCT 34.8 (L) 06/16/2013 1031   PLT 279 12/17/2017 0415   PLT 226 06/16/2013 1031    BMET    Component Value Date/Time   NA 139 12/17/2017 0415   NA 139 06/16/2013 1031   K 3.4 (L) 12/17/2017 0415   K 3.8 06/16/2013 1031   CL 113 (H) 12/17/2017 0415   CL 105 06/16/2013 1031   CO2 18 (L) 12/17/2017 0415   CO2 30 06/16/2013 1031   GLUCOSE 71 12/17/2017 0415   GLUCOSE 96 06/16/2013 1031   BUN 9 12/17/2017 0415   BUN 23 (H) 06/16/2013 1031   CREATININE 0.50 12/17/2017 0415   CREATININE 0.58 (L) 06/16/2013 1031   CALCIUM 8.2 (L) 12/17/2017 0415   CALCIUM 9.7 06/16/2013 1031   GFRNONAA >60 12/17/2017 0415   GFRNONAA >60 06/16/2013 1031   GFRAA >60 12/17/2017 0415   GFRAA >60 06/16/2013 1031    Assessment/Planning: POD #6 s/p left below-knee amputation   Patient has a bed at [facility].   I will plan for discharged either tomorrow or Wednesday       [name]  12/18/2017, 8:16 PM

## 2017-12-18 NOTE — Discharge Summary (Signed)
[place]    Discharge Summary    Patient ID:  Melissa [name] MRN: [id number] DOB/AGE: May 12, 1941 77 y.o.  Admit date: 12/05/2017 Discharge date: 12/18/2017 Date of Surgery: 12/12/2017 Surgeon: Surgeon(s): Eda Magnussen, Dolores Lory, MD  Admission Diagnosis: Ischemic pain of left foot [phone], I99.9]  Discharge Diagnoses:  Ischemic pain of left foot [phone], I99.9]  Secondary Diagnoses: Past Medical History:  Diagnosis Date  . Anemia   . Anxiety   . Arthritis    rheumatoid  . Ataxia   . COPD (chronic obstructive pulmonary disease) ([place])   . Depression   . Diverticulosis   . Excessive falling   . GERD (gastroesophageal reflux disease)   . Hyperlipidemia   . Hypertension   . IBS (irritable bowel syndrome)   . Osteoarthritis   . Psoriasis   . Raynaud's disease without gangrene   . Raynaud's disease without gangrene   . Shortness of breath dyspnea    with exertion    Procedure(s): AMPUTATION BELOW KNEE  Discharged Condition: good  HPI:  The patient was admitted to the hospital on 12/05/2017 following a angiogram with a successful angioplasty.  It was elected to continue her on anticoagulants overnight.  Unfortunately the overall condition of her left foot did not improve and after extensive discussion with the patient and family we elected to proceed with left below the knee amputation.  Surgery for the below-knee amputation was performed 12/12/2017.  Postoperatively pain control was somewhat difficult over the ensuing 2 to 3 days she became increasingly lethargic initially it was thought that narcotics were the problem however she subsequently also became hypotensive and developed a white count and a pneumonia was identified.  She was transferred to the intensive care unit for further work-up and evaluation.  Over the course of the next several days she improved dramatically.  She was transferred back to the floor on Sunday, June 2 and subsequently when  I saw her on June 3 she had done well she was on oral antibiotics her white count is normalized she was not having any fever and her pain was under good control.  She is therefore felt fit for transfer to rehab.  Hospital Course:  Melissa [name] is a 77 y.o. female is S/P Left Procedure(s): AMPUTATION BELOW KNEE Extubated: POD # 0 Physical exam: There are a few wheezes bilaterally Post-op wounds clean, dry, intact or healing well Pt. Ambulating, voiding and taking PO diet without difficulty. Pt pain controlled with PO pain meds. Labs as below Complications: Postoperative pneumonia  Consults:  Treatment Team:  Corey Skains, MD  Medicine was also consulted as well as the ICU team just  Significant Diagnostic Studies: CBC Lab Results  Component Value Date   WBC 14.4 (H) 12/17/2017   HGB 8.7 (L) 12/17/2017   HCT 25.7 (L) 12/17/2017   MCV 90.3 12/17/2017   PLT 279 12/17/2017    BMET    Component Value Date/Time   NA 139 12/17/2017 0415   NA 139 06/16/2013 1031   K 3.4 (L) 12/17/2017 0415   K 3.8 06/16/2013 1031   CL 113 (H) 12/17/2017 0415   CL 105 06/16/2013 1031   CO2 18 (L) 12/17/2017 0415   CO2 30 06/16/2013 1031   GLUCOSE 71 12/17/2017 0415   GLUCOSE 96 06/16/2013 1031   BUN 9 12/17/2017 0415   BUN 23 (H) 06/16/2013 1031   CREATININE 0.50 12/17/2017 0415   CREATININE 0.58 (L) 06/16/2013 1031   CALCIUM  8.2 (L) 12/17/2017 0415   CALCIUM 9.7 06/16/2013 1031   GFRNONAA >60 12/17/2017 0415   GFRNONAA >60 06/16/2013 1031   GFRAA >60 12/17/2017 0415   GFRAA >60 06/16/2013 1031   COAG Lab Results  Component Value Date   INR 1.36 12/14/2017   INR 1.17 12/07/2017   INR 1.33 04/19/2017     Disposition:  Discharge to :Rehab Discharge Instructions    Call MD for:  redness, tenderness, or signs of infection (pain, swelling, bleeding, redness, odor or green/yellow discharge around incision site)   Complete by:  As directed    Call MD for:  redness,  tenderness, or signs of infection (pain, swelling, bleeding, redness, odor or green/yellow discharge around incision site)   Complete by:  As directed    Call MD for:  severe or increased pain, loss or decreased feeling  in affected limb(s)   Complete by:  As directed    Call MD for:  severe or increased pain, loss or decreased feeling  in affected limb(s)   Complete by:  As directed    Call MD for:  temperature >100.5   Complete by:  As directed    Call MD for:  temperature >100.5   Complete by:  As directed    Discharge instructions   Complete by:  As directed    Dry gauze dressing and a light ACE wrap daily to the left amputation site   Discharge instructions   Complete by:  As directed    Please place a dry gauze dressing on the left below-knee amputation stump and then a light Ace wrap change daily   No dressing needed   Complete by:  As directed    Replace only if drainage present   No dressing needed   Complete by:  As directed    Replace only if drainage present   Resume previous diet   Complete by:  As directed    Resume previous diet   Complete by:  As directed      Allergies as of 12/18/2017      Reactions   Lodine [etodolac] Hives, Itching   Methotrexate Derivatives Other (See Comments)   Flu like symptoms   Nitrofuran Derivatives Nausea And Vomiting, Other (See Comments)   Made sicker    Norvasc [amlodipine] Other (See Comments)   Unknown    Pentasa [mesalamine Er] Other (See Comments)   Unknown    Red Yeast Rice Extract Other (See Comments)   Patient preference    Vioxx [rofecoxib] Other (See Comments)   Unknown    Levaquin [levofloxacin] Itching, Rash, Other (See Comments)   Muscle pain   Sulfa Antibiotics Itching, Rash      Medication List    STOP taking these medications   clopidogrel 75 MG tablet Commonly known as:  PLAVIX     TAKE these medications   acetaminophen 500 MG tablet Commonly known as:  TYLENOL Take 1,000 mg by mouth every 8  (eight) hours as needed for mild pain or headache.   apixaban 5 MG Tabs tablet Commonly known as:  ELIQUIS Take 1 tablet (5 mg total) by mouth 2 (two) times daily.   aspirin EC 81 MG tablet Take 1 tablet (81 mg total) by mouth daily.   budesonide 3 MG 24 hr capsule Commonly known as:  ENTOCORT EC Take 3 capsules (9 mg total) by mouth daily. Taper down to 3 mg Start taking on:  12/19/2017 What changed:    how much to  take  when to take this  additional instructions   CALCIUM 600+D 600-400 MG-UNIT tablet Generic drug:  Calcium Carbonate-Vitamin D Take 1 tablet by mouth 2 (two) times daily.   cefdinir 300 MG capsule Commonly known as:  OMNICEF Take 1 capsule (300 mg total) by mouth every 12 (twelve) hours.   cephALEXin 500 MG capsule Commonly known as:  KEFLEX Take 500 mg by mouth 3 (three) times daily. For 10 days - starting 11/21/2017   Cranberry 500 MG Caps Take 500 mg by mouth daily.   diltiazem 240 MG 24 hr capsule Commonly known as:  CARDIZEM CD Take 240 mg by mouth daily.   estradiol 0.1 MG/GM vaginal cream Commonly known as:  ESTRACE Place 1 g vaginally 3 (three) times a week.   ferrous sulfate 325 (65 FE) MG tablet Take 325 mg by mouth daily with breakfast.   gabapentin 300 MG capsule Commonly known as:  NEURONTIN Take 1 capsule (300 mg total) by mouth daily.   gemfibrozil 600 MG tablet Commonly known as:  LOPID Take 600 mg by mouth 2 (two) times daily before a meal.   hydroxychloroquine 200 MG tablet Commonly known as:  PLAQUENIL Take 200 mg by mouth 2 (two) times daily.   leflunomide 20 MG tablet Commonly known as:  ARAVA Take 20 mg by mouth daily.   lisinopril 10 MG tablet Commonly known as:  PRINIVIL,ZESTRIL Take 10 mg by mouth daily.   LORazepam 1 MG tablet Commonly known as:  ATIVAN Take 1 tablet (1 mg total) by mouth 2 (two) times daily as needed for anxiety.   multivitamin tablet Take 1 tablet by mouth daily.   omeprazole 20 MG  capsule Commonly known as:  PRILOSEC Take 20 mg by mouth 2 (two) times daily before a meal.   oxyCODONE-acetaminophen 5-325 MG tablet Commonly known as:  PERCOCET Take 1 tablet by mouth every 6 (six) hours as needed for moderate pain or severe pain. What changed:    how much to take  how to take this  when to take this  reasons to take this  additional instructions   pravastatin 80 MG tablet Commonly known as:  PRAVACHOL Take 80 mg by mouth at bedtime.   predniSONE 5 MG tablet Commonly known as:  DELTASONE Take 5 mg by mouth daily with breakfast.   PRESERVISION AREDS Caps Take 1 capsule by mouth 2 (two) times daily.   sertraline 50 MG tablet Commonly known as:  ZOLOFT Take 50 mg by mouth at bedtime.   tiZANidine 4 MG tablet Commonly known as:  ZANAFLEX Take 4 mg by mouth daily as needed for muscle spasms (neck pain).      Verbal and written Discharge instructions given to the patient. Wound care per Discharge AVS Contact information for after-discharge care    Destination    [facility] .   Service:  Skilled Nursing Contact information: [address] [place] [phone]              Signed: [name], MD  12/18/2017, 3:54 PM

## 2017-12-18 NOTE — Progress Notes (Signed)
AVS printed and given to patient and daughter. Report called to Kim at [employer]. IV and telemetry to be removed prior to discharge. Daughter really wants to speak with Dr. Delana Meyer. Informed her that MD is in procedures for several more hours (per Vascular Lab RN), but he is willing to give her a call when he finishes up. Phone number left with Vascular RN for Dr. Delana Meyer. Daughter with many questions about [facility]. CSW, Randall Hiss and [employer] speaking to daughter.

## 2017-12-18 NOTE — Clinical Social Work Placement (Signed)
   CLINICAL SOCIAL WORK PLACEMENT  NOTE  Date:  12/18/2017  Patient Details  Name: Melissa [name] MRN: [id number] Date of Birth: [birth year]  Clinical Social Work is seeking post-discharge placement for this patient at the [place] level of care (*CSW will initial, date and re-position this form in  chart as items are completed):  Yes   Patient/family provided with [facility] Work Department's list of facilities offering this level of care within the geographic area requested by the patient (or if unable, by the patient's family).  Yes   Patient/family informed of their freedom to choose among providers that offer the needed level of care, that participate in Medicare, Medicaid or managed care program needed by the patient, have an available bed and are willing to accept the patient.  Yes   Patient/family informed of [facility]'s ownership interest in [facility] and [facility], as well as of the fact that they are under no obligation to receive care at these facilities.  PASRR submitted to EDS on 12/17/17     PASRR number received on       Existing PASRR number confirmed on 12/17/17     FL2 transmitted to all facilities in geographic area requested by pt/family on 12/17/17     FL2 transmitted to all facilities within larger geographic area on       Patient informed that his/her managed care company has contracts with or will negotiate with certain facilities, including the following:        Yes   Patient/family informed of bed offers received.  Patient chooses bed at [facility]     Physician recommends and patient chooses bed at      Patient to be transferred to [facility] on  .  Patient to be transferred to facility by [facility]     Patient family notified on 12/18/17 of transfer.  Name of family member notified:  Patient's daughter [name] was at bedside.     PHYSICIAN Please sign FL2, Please  prepare prescriptions     Additional Comment:    _______________________________________________ Ross Ludwig, LCSWA 12/18/2017, 4:27 PM

## 2017-12-18 NOTE — Care Management Important Message (Signed)
Important Message  Patient Details  Name: Melissa [name] MRN: [id number] Date of Birth: 1941-05-06   Medicare Important Message Given:  Yes    Katrina Stack, RN 12/18/2017, 8:13 AM

## 2017-12-18 NOTE — Progress Notes (Signed)
Patient wondering if she will be discharged today. Paged Dr. Delana Meyer. Call back from [facility] in Vascular Lab - said he would relay the message.

## 2017-12-18 NOTE — Progress Notes (Signed)
Physical Therapy Treatment Patient Details Name: Melissa [name] MRN: [id number] DOB: Aug 22, 1940 Today's Date: 12/18/2017    History of Present Illness  77 y/o Female who was admitted to [facility] having received previous revascularization procedure w/ angioplasty in the left distal anterior tibial and dorsalis pedis arteries and now is s/p BK amputation on L LE 12/12/17.  Pt had infection and PVD on LLE, with complicating PAF and elevated troponin.  Had postop PNA and sepsis as well as AMS. PMHx:  LLE gangrene, COPD, back pain, raynauds, OA, RA, falls, HTN,  SOB with exertion    PT Comments    Pr feeling well ready to get up this am.  Participated in exercises as described below.  To edge of bed with ease and rail.  Once sitting, able to remain upright without assist.  Stood with walker and min/mod a x 1 with bracing of right foot to prevent sliding despite grippie sock.  She was able to remain standing for several minutes and ROM RLE was encouraged.  After seated rest and donning of shoe on RLE, she was able to stand again.  She was able to take several small hops to recliner, back to bed, then back to recliner without seated rest.  Overall tolerated small steps with walker well and was pleased with her progress.  Remained in recliner after session.   Follow Up Recommendations  SNF     Equipment Recommendations       Recommendations for Other Services       Precautions / Restrictions Precautions Precautions: Fall Restrictions Weight Bearing Restrictions: Yes LLE Weight Bearing: Non weight bearing    Mobility  Bed Mobility Overal bed mobility: Modified Independent Bed Mobility: Supine to Sit     Supine to sit: Modified independent (Device/Increase time);HOB elevated     General bed mobility comments: pt steady, use of rails to assist  Transfers Overall transfer level: Needs assistance Equipment used: Rolling walker (2 wheeled) Transfers: Sit to/from Stand               Ambulation/Gait Ambulation/Gait assistance: Min assist Ambulation Distance (Feet): 5 Feet Assistive device: Rolling walker (2 wheeled)     Gait velocity interpretation: <1.8 ft/sec, indicate of risk for recurrent falls General Gait Details: hop to pattern to/from recliner x 3 without seated rest   Stairs             Wheelchair Mobility    Modified Rankin (Stroke Patients Only)       Balance Overall balance assessment: Needs assistance Sitting-balance support: Feet supported;Bilateral upper extremity supported Sitting balance-Leahy Scale: Good Sitting balance - Comments: good static balance   Standing balance support: Bilateral upper extremity supported;During functional activity Standing balance-Leahy Scale: Poor Standing balance comment: Requires assist to remain upright in standing                            Cognition Arousal/Alertness: Awake/alert Behavior During Therapy: WFL for tasks assessed/performed Overall Cognitive Status: Within Functional Limits for tasks assessed                                        Exercises Other Exercises Other Exercises: LLE ex including quad sets, SLR, ab/add hip.knee flexion with manual resistance x 10    General Comments        Pertinent Vitals/Pain Pain Assessment: 0-10 Pain  Score: 6  Pain Location: L ankle phantom pain after mobility  Pain Descriptors / Indicators: Aching Pain Intervention(s): Limited activity within patient's tolerance;Monitored during session    Home Living                      Prior Function            PT Goals (current goals can now be found in the care plan section) Progress towards PT goals: Progressing toward goals    Frequency    7X/week      PT Plan Current plan remains appropriate    Co-evaluation              AM-PAC PT "6 Clicks" Daily Activity  Outcome Measure  Difficulty turning over in bed (including adjusting  bedclothes, sheets and blankets)?: None Difficulty moving from lying on back to sitting on the side of the bed? : None Difficulty sitting down on and standing up from a chair with arms (e.g., wheelchair, bedside commode, etc,.)?: Unable Help needed moving to and from a bed to chair (including a wheelchair)?: A Little Help needed walking in hospital room?: A Lot Help needed climbing 3-5 steps with a railing? : Total 6 Click Score: 15    End of Session Equipment Utilized During Treatment: Gait belt Activity Tolerance: Patient tolerated treatment well Patient left: in chair;with call bell/phone within reach;with family/visitor present   Pain - Right/Left: Left Pain - part of body: Leg     Time: 9935-7017 PT Time Calculation (min) (ACUTE ONLY): 23 min  Charges:  $Gait Training: 8-22 mins $Therapeutic Exercise: 8-22 mins                    G Codes:       Chesley Noon, PTA 12/18/17, 11:25 AM

## 2017-12-18 NOTE — Clinical Social Work Note (Addendum)
Patient to be d/c'ed today to [facility] room 705.  Patient and family agreeable to plans will transport via ems RN to call report to 700 hall nurse at [phone].  Patient's family is at bedside and aware that she is discharging to SNF today.  5:30pm  Discharge has been cancelled for tonight, plan to discharge on Wednesday morning.  CSW to continue to facilitate discharge planning.  Evette Cristal, MSW, [place]

## 2017-12-18 NOTE — Progress Notes (Addendum)
[facility] NAME: Melissa [name]    MR#:  [id number]  DATE OF BIRTH:  1940-09-05  SUBJECTIVE:  CHIEF COMPLAINT:  No chief complaint on file.  -Diarrhea is much better.  Pain is improved -Appetite is slowly improving  REVIEW OF SYSTEMS:  Review of Systems  Constitutional: Negative for chills, fever and malaise/fatigue.  HENT: Negative for congestion, ear discharge, hearing loss and nosebleeds.   Eyes: Negative for blurred vision and double vision.  Respiratory: Negative for cough, shortness of breath and wheezing.   Cardiovascular: Negative for chest pain and palpitations.  Gastrointestinal: Positive for diarrhea. Negative for abdominal pain, constipation, nausea and vomiting.  Genitourinary: Negative for dysuria.  Musculoskeletal: Negative for myalgias.  Neurological: Negative for dizziness, seizures and headaches.  Psychiatric/Behavioral: Negative for depression.    DRUG ALLERGIES:   Allergies  Allergen Reactions  . Lodine [Etodolac] Hives and Itching  . Methotrexate Derivatives Other (See Comments)    Flu like symptoms  . Nitrofuran Derivatives Nausea And Vomiting and Other (See Comments)    Made sicker   . Norvasc [Amlodipine] Other (See Comments)    Unknown   . Pentasa [Mesalamine Er] Other (See Comments)    Unknown   . Red Yeast Rice Extract Other (See Comments)    Patient preference   . Vioxx [Rofecoxib] Other (See Comments)    Unknown   . Levaquin [Levofloxacin] Itching, Rash and Other (See Comments)    Muscle pain  . Sulfa Antibiotics Itching and Rash    VITALS:  Blood pressure 138/71, pulse 73, temperature 98.4 F (36.9 C), temperature source Oral, resp. rate 18, height 5' 4"  (1.626 m), weight 55.3 kg (122 lb), SpO2 100 %.  PHYSICAL EXAMINATION:  Physical Exam  GENERAL:  77 y.o.-year-old patient lying in the bed with no acute distress.  EYES: Pupils equal, round, reactive to light and accommodation. No  scleral icterus. Extraocular muscles intact.  HEENT: Head atraumatic, normocephalic. Oropharynx and nasopharynx clear.  NECK:  Supple, no jugular venous distention. No thyroid enlargement, no tenderness.  LUNGS: Normal breath sounds bilaterally, no wheezing, rales,rhonchi or crepitation. No use of accessory muscles of respiration. Decreased bibasilar breath sounds CARDIOVASCULAR: S1, S2 normal. No murmurs, rubs, or gallops.  ABDOMEN: Soft, nontender, nondistended. Bowel sounds present. No organomegaly or mass.  EXTREMITIES: No  cyanosis, or clubbing. S/p left BKA NEUROLOGIC: Cranial nerves II through XII are intact. Muscle strength 5/5 in all extremities. Sensation intact. Gait not checked.  PSYCHIATRIC: The patient is alert and oriented x 3.  SKIN: No obvious rash, lesion, or ulcer.    LABORATORY PANEL:   CBC Recent Labs  Lab 12/17/17 0415  WBC 14.4*  HGB 8.7*  HCT 25.7*  PLT 279   ------------------------------------------------------------------------------------------------------------------  Chemistries  Recent Labs  Lab 12/14/17 1958  12/16/17 0556 12/17/17 0415  NA 137   < > 138 139  K 4.0   < > 3.8 3.4*  CL 106   < > 113* 113*  CO2 19*   < > 18* 18*  GLUCOSE 132*   < > 111* 71  BUN 35*   < > 13 9  CREATININE 0.74   < > 0.40* 0.50  CALCIUM 7.8*   < > 7.9* 8.2*  MG 1.8   < > 2.0  --   AST 58*  --   --   --   ALT 23  --   --   --   [facility]  71  --   --   --   BILITOT 0.5  --   --   --    < > = values in this interval not displayed.   ------------------------------------------------------------------------------------------------------------------  Cardiac Enzymes Recent Labs  Lab 12/15/17 0426  TROPONINI 0.06*   ------------------------------------------------------------------------------------------------------------------  RADIOLOGY:  No results found.  EKG:   Orders placed or performed during the hospital encounter of 12/05/17  . EKG 12-Lead  .  EKG 12-Lead    ASSESSMENT AND PLAN:   77 y/o with past medical history significant for hypertension, arthritis, rainouts disease, peripheral arterial disease, COPD not on home oxygen, GERD presents to hospital secondary to left foot gangrene and had BKA -Medical consult requested for postoperative sepsis  1.  Sepsis- from postoperative pneumonia -Off Levophed now, discontinue IV fluids today -WBC slowly improving.  Urine analysis is negative -Chest x-ray with bibasilar infiltrates. -currently on oral ceftin- continue for 4 more days  2.  Hypokalemia -replaced appropriately  3.  Peripheral arterial disease-management per vascular.  Status post left BKA - POD # 6 -Aspirin and Plavix -Pain medications with gabapentin and tramadol  4.  Diarrhea-could be from underlying colitis.    Restarted Entocort. -C. difficile PCR is negative.  Could also be from antibiotics. -Probiotics added.  Imodium as needed - Much improved diarrhea  5.  Acute postoperative anemia-hemoglobin dropped from baseline of 9 to 6.7 postoperatively.   -Received 1 unit packed RBC and stable now -Continue to monitor and transfuse as needed  6.  DVT prophylaxis-teds and SCDs for now Also added lovenox  7. Elevated troponin- stable, appreciate cardiology consult.  Demand ischemia.  PT consult- will need rehab at discharge Anticipate discharge today.  Medically stable.  Will sign off.  Please call if any questions.   All the records are reviewed and case discussed with Care Management/Social Workerr. Management plans discussed with the patient, family and they are in agreement.  CODE STATUS: Full Code  TOTAL TIME TAKING CARE OF THIS PATIENT: 32 minutes.   POSSIBLE D/C IN 1-2 DAYS, DEPENDING ON CLINICAL CONDITION.   Gladstone Lighter M.D on 12/18/2017 at 7:43 AM  Between 7am to 6pm - Pager - [phone]  After 6pm go to [email or web address] - password [email or web address]  Office   [phone]  CC: Primary care physician; [name], MD

## 2017-12-19 DIAGNOSIS — M069 Rheumatoid arthritis, unspecified: Secondary | ICD-10-CM | POA: Diagnosis not present

## 2017-12-19 DIAGNOSIS — Z89512 Acquired absence of left leg below knee: Secondary | ICD-10-CM | POA: Diagnosis not present

## 2017-12-19 DIAGNOSIS — F329 Major depressive disorder, single episode, unspecified: Secondary | ICD-10-CM | POA: Diagnosis not present

## 2017-12-19 DIAGNOSIS — K219 Gastro-esophageal reflux disease without esophagitis: Secondary | ICD-10-CM | POA: Diagnosis not present

## 2017-12-19 DIAGNOSIS — E785 Hyperlipidemia, unspecified: Secondary | ICD-10-CM | POA: Diagnosis not present

## 2017-12-19 DIAGNOSIS — I2510 Atherosclerotic heart disease of native coronary artery without angina pectoris: Secondary | ICD-10-CM | POA: Diagnosis not present

## 2017-12-19 DIAGNOSIS — D649 Anemia, unspecified: Secondary | ICD-10-CM | POA: Diagnosis not present

## 2017-12-19 DIAGNOSIS — K529 Noninfective gastroenteritis and colitis, unspecified: Secondary | ICD-10-CM | POA: Diagnosis not present

## 2017-12-19 DIAGNOSIS — I999 Unspecified disorder of circulatory system: Secondary | ICD-10-CM | POA: Diagnosis not present

## 2017-12-19 DIAGNOSIS — Z7401 Bed confinement status: Secondary | ICD-10-CM | POA: Diagnosis not present

## 2017-12-19 DIAGNOSIS — G8929 Other chronic pain: Secondary | ICD-10-CM | POA: Diagnosis not present

## 2017-12-19 DIAGNOSIS — F419 Anxiety disorder, unspecified: Secondary | ICD-10-CM | POA: Diagnosis not present

## 2017-12-19 DIAGNOSIS — I4891 Unspecified atrial fibrillation: Secondary | ICD-10-CM | POA: Diagnosis not present

## 2017-12-19 DIAGNOSIS — D508 Other iron deficiency anemias: Secondary | ICD-10-CM | POA: Diagnosis not present

## 2017-12-19 DIAGNOSIS — Z5189 Encounter for other specified aftercare: Secondary | ICD-10-CM | POA: Diagnosis not present

## 2017-12-19 DIAGNOSIS — J302 Other seasonal allergic rhinitis: Secondary | ICD-10-CM | POA: Diagnosis not present

## 2017-12-19 DIAGNOSIS — I10 Essential (primary) hypertension: Secondary | ICD-10-CM | POA: Diagnosis not present

## 2017-12-19 DIAGNOSIS — R2681 Unsteadiness on feet: Secondary | ICD-10-CM | POA: Diagnosis not present

## 2017-12-19 DIAGNOSIS — M6281 Muscle weakness (generalized): Secondary | ICD-10-CM | POA: Diagnosis not present

## 2017-12-19 DIAGNOSIS — J449 Chronic obstructive pulmonary disease, unspecified: Secondary | ICD-10-CM | POA: Diagnosis not present

## 2017-12-19 LAB — CULTURE, BLOOD (ROUTINE X 2)
CULTURE: NO GROWTH
Culture: NO GROWTH
Special Requests: ADEQUATE
Special Requests: ADEQUATE

## 2017-12-19 NOTE — Progress Notes (Signed)
Called report to [facility], called ems.  Removed PIV and removed telemetry.  Patient has no questions.  Daughter has no questions.  Patient to be escorted out of hospital via stretcher via ems.

## 2017-12-19 NOTE — Clinical Social Work Note (Addendum)
Patient to be d/c'ed today to [facility] room 705.  Patient and family agreeable to plans will transport via ems RN to call report to 700 hall nurse at [phone].  Patient's daughter is at bedside, nurse confirmed with MD that there are no changes to the discharge summary dated 12-18-17.  Evette Cristal, MSW, [place]

## 2017-12-19 NOTE — Progress Notes (Signed)
Verbal confirmation from Dr. Delana Meyer that there are no changes to the discharge summary dated 12/18/17.  Patient planning to d/c to [facility] today.

## 2017-12-20 DIAGNOSIS — E785 Hyperlipidemia, unspecified: Secondary | ICD-10-CM | POA: Diagnosis not present

## 2017-12-20 DIAGNOSIS — K529 Noninfective gastroenteritis and colitis, unspecified: Secondary | ICD-10-CM | POA: Diagnosis not present

## 2017-12-20 DIAGNOSIS — K219 Gastro-esophageal reflux disease without esophagitis: Secondary | ICD-10-CM | POA: Diagnosis not present

## 2017-12-20 DIAGNOSIS — J302 Other seasonal allergic rhinitis: Secondary | ICD-10-CM | POA: Diagnosis not present

## 2017-12-20 DIAGNOSIS — F419 Anxiety disorder, unspecified: Secondary | ICD-10-CM | POA: Diagnosis not present

## 2017-12-20 DIAGNOSIS — I10 Essential (primary) hypertension: Secondary | ICD-10-CM | POA: Diagnosis not present

## 2017-12-20 DIAGNOSIS — I2510 Atherosclerotic heart disease of native coronary artery without angina pectoris: Secondary | ICD-10-CM | POA: Diagnosis not present

## 2017-12-20 DIAGNOSIS — F329 Major depressive disorder, single episode, unspecified: Secondary | ICD-10-CM | POA: Diagnosis not present

## 2017-12-20 DIAGNOSIS — Z89512 Acquired absence of left leg below knee: Secondary | ICD-10-CM | POA: Diagnosis not present

## 2017-12-24 ENCOUNTER — Telehealth (INDEPENDENT_AMBULATORY_CARE_PROVIDER_SITE_OTHER): Payer: Self-pay | Admitting: Vascular Surgery

## 2017-12-24 NOTE — Telephone Encounter (Signed)
Patient will be seen on 01/03/18 and the doctor can discuss this at that time.

## 2017-12-24 NOTE — Telephone Encounter (Signed)
Patients daughter Arrie Aran called wanting some information about where her mom can get her prostetics from.

## 2017-12-27 DIAGNOSIS — K219 Gastro-esophageal reflux disease without esophagitis: Secondary | ICD-10-CM | POA: Diagnosis not present

## 2017-12-27 DIAGNOSIS — K529 Noninfective gastroenteritis and colitis, unspecified: Secondary | ICD-10-CM | POA: Diagnosis not present

## 2017-12-27 DIAGNOSIS — I10 Essential (primary) hypertension: Secondary | ICD-10-CM | POA: Diagnosis not present

## 2017-12-27 DIAGNOSIS — F419 Anxiety disorder, unspecified: Secondary | ICD-10-CM | POA: Diagnosis not present

## 2017-12-27 DIAGNOSIS — D649 Anemia, unspecified: Secondary | ICD-10-CM | POA: Diagnosis not present

## 2017-12-27 DIAGNOSIS — Z89512 Acquired absence of left leg below knee: Secondary | ICD-10-CM | POA: Diagnosis not present

## 2017-12-27 DIAGNOSIS — E785 Hyperlipidemia, unspecified: Secondary | ICD-10-CM | POA: Diagnosis not present

## 2017-12-27 DIAGNOSIS — I2510 Atherosclerotic heart disease of native coronary artery without angina pectoris: Secondary | ICD-10-CM | POA: Diagnosis not present

## 2017-12-27 DIAGNOSIS — M069 Rheumatoid arthritis, unspecified: Secondary | ICD-10-CM | POA: Diagnosis not present

## 2017-12-30 IMAGING — CT CT KNEE*L* W/O CM
1 of 3 series · 7 of 14 positions shown, 9 images · non-contrast
Comparison: None.

CLINICAL DATA: Left knee pain and swelling.

EXAM:
CT OF THE LEFT KNEE WITHOUT CONTRAST
TECHNIQUE: Multidetector CT imaging of the LEFT knee was performed according to
the standard protocol. Multiplanar CT image reconstructions were
also generated.

[Series 6: axial st · axial · 0.29mm/px · z∈[+506,+730]mm · 7 of 300 slices shown, 9 images]
[im 38/300  soft-tissue]
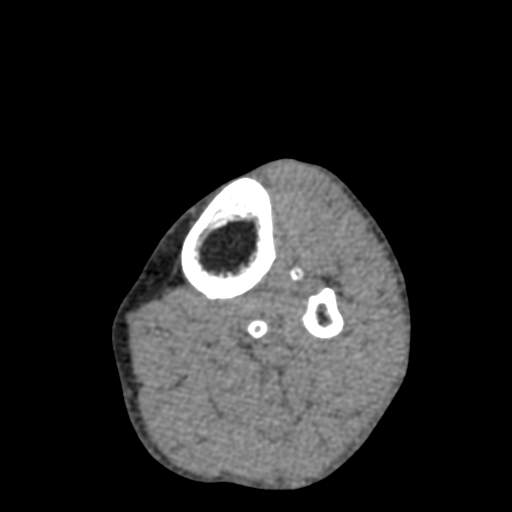
[im 38/300  bone]
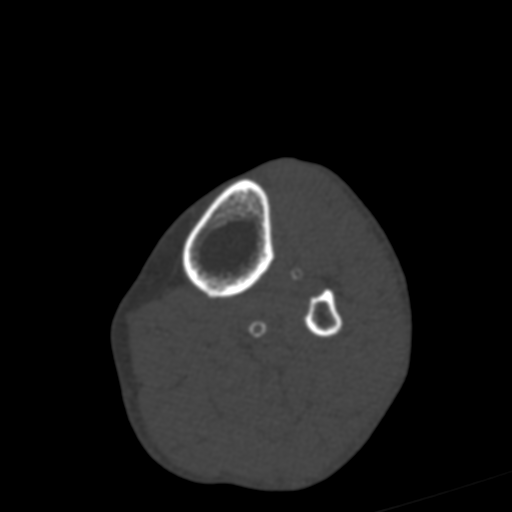
[im 75/300  bone]
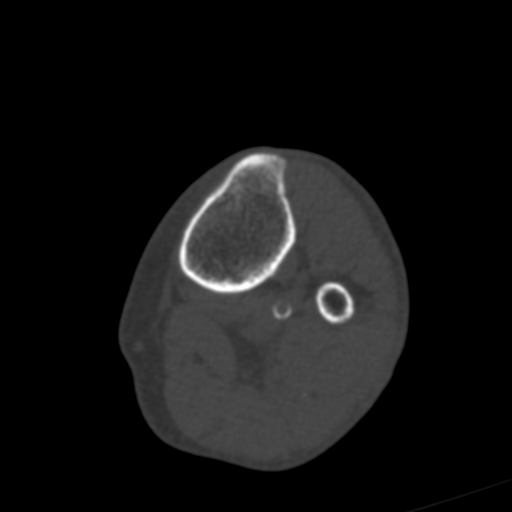
[im 113/300  bone]
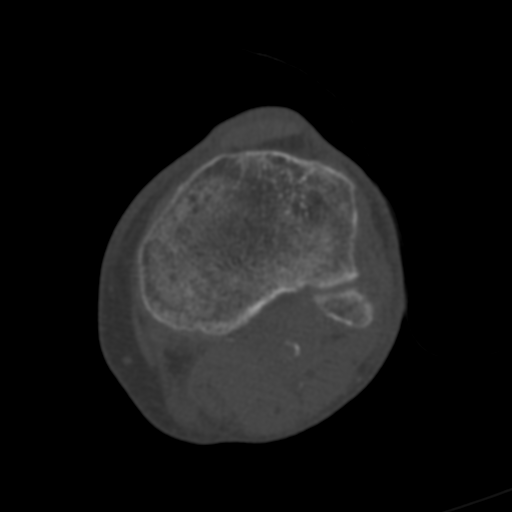
[im 150/300  bone]
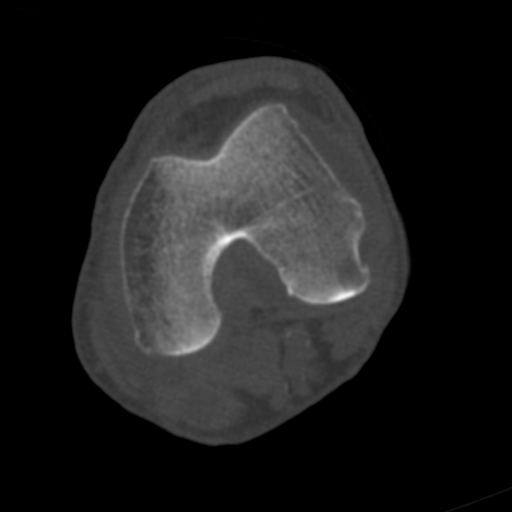
[im 187/300  soft-tissue]
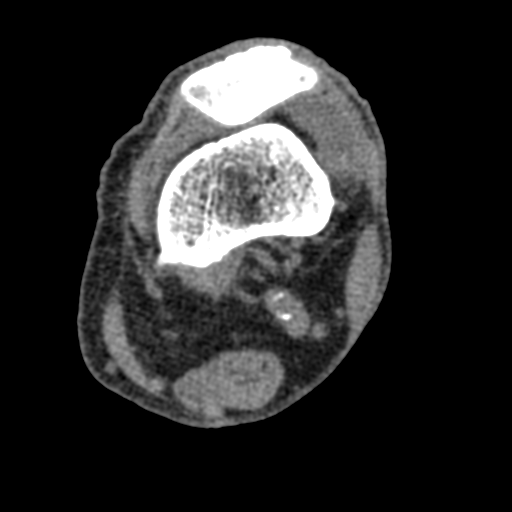
[im 187/300  bone]
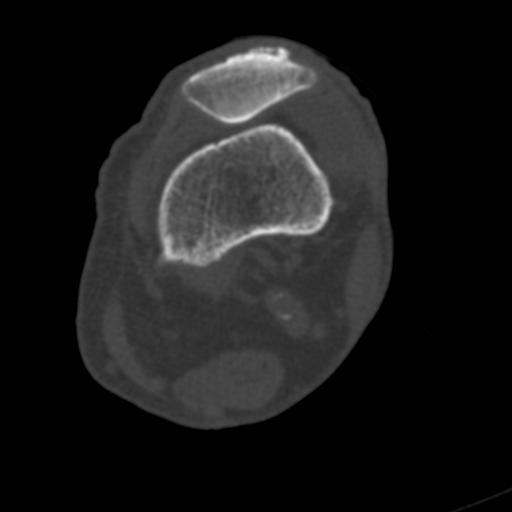
[im 225/300  bone]
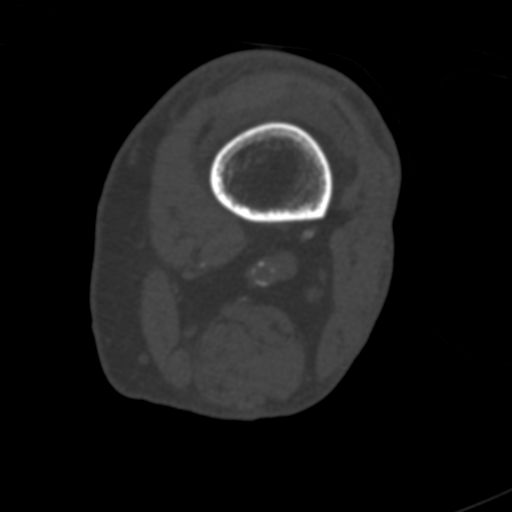
[im 262/300  bone]
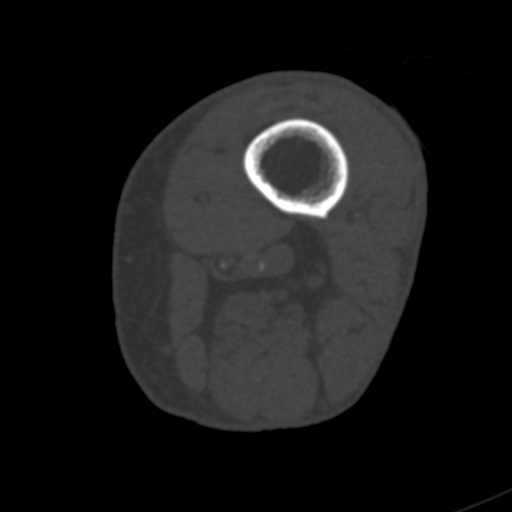

[7 of 14 positions shown; findings below may reference images not displayed]

FINDINGS: The hip demonstrates no fracture or dislocation. There is no lytic
or blastic lesion. There is severe osteoarthritis of the left
sacroiliac joint.

The knee demonstrates no fracture or dislocation. There is no lytic
or blastic lesion. There is a moderate joint effusion. There is
moderate -severe osteoarthritis of the lateral femorotibial
compartment. There is there is osteoarthritis of the lateral
patellofemoral compartment. There is a moderate size Baker cyst.

The ankle demonstrates no fracture or dislocation. There is no lytic
or blastic lesion. There is peripheral vascular atherosclerotic
disease.
IMPRESSION: 1. Osteoarthritis of the lateral femorotibial compartment and
patellofemoral compartment of the left knee.

## 2018-01-03 ENCOUNTER — Ambulatory Visit (INDEPENDENT_AMBULATORY_CARE_PROVIDER_SITE_OTHER): Payer: Medicare HMO | Admitting: Vascular Surgery

## 2018-01-03 ENCOUNTER — Encounter (INDEPENDENT_AMBULATORY_CARE_PROVIDER_SITE_OTHER): Payer: Self-pay | Admitting: Vascular Surgery

## 2018-01-03 VITALS — BP 130/57 | HR 58 | Resp 16 | Wt 116.0 lb

## 2018-01-03 DIAGNOSIS — I7025 Atherosclerosis of native arteries of other extremities with ulceration: Secondary | ICD-10-CM

## 2018-01-03 DIAGNOSIS — E7800 Pure hypercholesterolemia, unspecified: Secondary | ICD-10-CM

## 2018-01-03 MED ORDER — OXYCODONE-ACETAMINOPHEN 5-325 MG PO TABS: 1.0000 | ORAL_TABLET | Freq: Four times a day (QID) | ORAL | 0 refills | Status: DC | PRN

## 2018-01-03 MED ORDER — TRAMADOL HCL 50 MG PO TABS: 50.0000 mg | ORAL_TABLET | Freq: Four times a day (QID) | ORAL | 0 refills | Status: DC | PRN

## 2018-01-03 MED ORDER — COLLAGENASE 250 UNIT/GM EX OINT: 1.0000 "application " | TOPICAL_OINTMENT | Freq: Every day | CUTANEOUS | 5 refills | Status: DC

## 2018-01-03 MED ORDER — OXYCODONE-ACETAMINOPHEN 5-325 MG PO TABS: 1.0000 | ORAL_TABLET | ORAL | 0 refills | Status: DC | PRN

## 2018-01-03 MED ORDER — TRAMADOL HCL 50 MG PO TABS: 50.0000 mg | ORAL_TABLET | Freq: Four times a day (QID) | ORAL | 5 refills | Status: DC | PRN

## 2018-01-03 NOTE — Progress Notes (Signed)
Subjective:    Patient ID: Melissa [name], female    DOB: 02-May-1941, 77 y.o.   MRN: [id number] Chief Complaint  Patient presents with  . Follow-up    [facility] 2wk amputation   The patient presents for her first postoperative follow-up.  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  The patient reports an unremarkable postoperative course with the exception of developing a blister located just distal to her knee and proximal to her incision.  This blister popped approximately 1 week ago and she is now left with an eschar to the area.  The patient denies any erythema, foul odor or drainage from the area.  The patient has not been receiving any wound care to the area since she has been at [facility].  The patient will be leaving [facility] today and returning home with visiting nurse services for continued wound care.  The patient is asking for more pain medication.  The patient denies any fever, nausea vomiting.  Review of Systems  Constitutional: Negative.   HENT: Negative.   Eyes: Negative.   Respiratory: Negative.   Cardiovascular: Negative.   Gastrointestinal: Negative.   Endocrine: Negative.   Genitourinary: Negative.   Musculoskeletal: Negative.   Skin: Positive for wound.  Allergic/Immunologic: Negative.   Neurological: Negative.   Hematological: Negative.   Psychiatric/Behavioral: Negative.       Objective:   Physical Exam  Constitutional: She appears well-developed and well-nourished. No distress.  HENT:  Head: Normocephalic and atraumatic.  Right Ear: External ear normal.  Left Ear: External ear normal.  Eyes: Pupils are equal, round, and reactive to light. Conjunctivae and EOM are normal.  Neck: Normal range of motion.  Cardiovascular: Normal rate, regular rhythm and normal heart sounds.  Pulmonary/Chest: Effort normal and breath sounds normal.  Musculoskeletal: Normal range of motion.  Skin: She is not diaphoretic.     Left lower  extremity below the knee amputation site: 4 cm x 5 cm eschar noted just distal to the knee and proximal to the incision site.  The patient does not have staples as her incision was closed.  The area is dry.  There is no drainage.  The surrounding skin is healthy.  There is no cellulitis.  Psychiatric: She has a normal mood and affect. Her behavior is normal. Judgment and thought content normal.  Vitals reviewed.  BP (!) 130/57 (BP Location: Left Arm)   Pulse (!) 58   Resp 16   Wt 116 lb (52.6 kg)   BMI 19.91 kg/m   Past Medical History:  Diagnosis Date  . Anemia   . Anxiety   . Arthritis    rheumatoid  . Ataxia   . COPD (chronic obstructive pulmonary disease) ([place])   . Depression   . Diverticulosis   . Excessive falling   . GERD (gastroesophageal reflux disease)   . Hyperlipidemia   . Hypertension   . IBS (irritable bowel syndrome)   . Osteoarthritis   . Psoriasis   . Raynaud's disease without gangrene   . Raynaud's disease without gangrene   . Shortness of breath dyspnea    with exertion   Social History   Socioeconomic History  . Marital status: Married    Spouse name: Not on file  . Number of children: Not on file  . Years of education: Not on file  . Highest education level: Not on file  Occupational History  . Not on file  Social Needs  .  Financial resource strain: Not on file  . Food insecurity:    Worry: Not on file    Inability: Not on file  . Transportation needs:    Medical: Not on file    Non-medical: Not on file  Tobacco Use  . Smoking status: Never Smoker  . Smokeless tobacco: Never Used  Substance and Sexual Activity  . Alcohol use: No  . Drug use: No  . Sexual activity: Not on file  Lifestyle  . Physical activity:    Days per week: Not on file    Minutes per session: Not on file  . Stress: Not on file  Relationships  . Social connections:    Talks on phone: Not on file    Gets together: Not on file    Attends religious service: Not on  file    Active member of club or organization: Not on file    Attends meetings of clubs or organizations: Not on file    Relationship status: Not on file  . Intimate partner violence:    Fear of current or ex partner: Not on file    Emotionally abused: Not on file    Physically abused: Not on file    Forced sexual activity: Not on file  Other Topics Concern  . Not on file  Social History Narrative  . Not on file   Past Surgical History:  Procedure Laterality Date  . ABDOMINAL HYSTERECTOMY    . AMPUTATION Left 12/12/2017   Procedure: AMPUTATION BELOW KNEE;  Surgeon: Katha Cabal, MD;  Location: [facility] ORS;  Service: Vascular;  Laterality: Left;  . APPENDECTOMY    . BACK SURGERY     Spinal fusion with rods and screws  . CHOLECYSTECTOMY    . COLONOSCOPY WITH PROPOFOL N/A 01/29/2017   Procedure: COLONOSCOPY WITH PROPOFOL;  Surgeon: Manya Silvas, MD;  Location: [facility] ENDOSCOPY;  Service: Endoscopy;  Laterality: N/A;  . ESOPHAGOGASTRODUODENOSCOPY (EGD) WITH PROPOFOL N/A 01/29/2017   Procedure: ESOPHAGOGASTRODUODENOSCOPY (EGD) WITH PROPOFOL;  Surgeon: Manya Silvas, MD;  Location: [facility] ENDOSCOPY;  Service: Endoscopy;  Laterality: N/A;  . EYE SURGERY Bilateral    Cataract Extraction with IOL  . KNEE ARTHROSCOPY Left    partial lateral meniscectomy, debridement, excision of Baker's cyst  . LEFT HEART CATH AND CORONARY ANGIOGRAPHY N/A 04/23/2017   Procedure: LEFT HEART CATH AND CORONARY ANGIOGRAPHY;  Surgeon: Teodoro Spray, MD;  Location: [place] CV LAB;  Service: Cardiovascular;  Laterality: N/A;  . LEFT HEART CATH AND CORONARY ANGIOGRAPHY Left 09/07/2017   Procedure: LEFT HEART CATH AND CORONARY ANGIOGRAPHY;  Surgeon: Teodoro Spray, MD;  Location: [place] CV LAB;  Service: Cardiovascular;  Laterality: Left;  . LOWER EXTREMITY ANGIOGRAPHY Left 11/27/2017   Procedure: LOWER EXTREMITY ANGIOGRAPHY;  Surgeon: Katha Cabal, MD;  Location: [place] CV LAB;   Service: Cardiovascular;  Laterality: Left;  . LOWER EXTREMITY ANGIOGRAPHY Left 12/05/2017   Procedure: LOWER EXTREMITY ANGIOGRAPHY;  Surgeon: Katha Cabal, MD;  Location: [place] CV LAB;  Service: Cardiovascular;  Laterality: Left;  . ROTATOR CUFF REPAIR Right   . TOTAL KNEE ARTHROPLASTY Left 04/04/2016   Procedure: TOTAL KNEE ARTHROPLASTY;  Surgeon: Hessie Knows, MD;  Location: [facility] ORS;  Service: Orthopedics;  Laterality: Left;   Family History  Problem Relation Age of Onset  . CAD Mother   . CAD Father   . Breast cancer Neg Hx    Allergies  Allergen Reactions  . Lodine [Etodolac] Hives and Itching  .  Methotrexate Derivatives Other (See Comments)    Flu like symptoms  . Nitrofuran Derivatives Nausea And Vomiting and Other (See Comments)    Made sicker   . Norvasc [Amlodipine] Other (See Comments)    Unknown   . Pentasa [Mesalamine Er] Other (See Comments)    Unknown   . Red Yeast Rice Extract Other (See Comments)    Patient preference   . Vioxx [Rofecoxib] Other (See Comments)    Unknown   . Levaquin [Levofloxacin] Itching, Rash and Other (See Comments)    Muscle pain  . Sulfa Antibiotics Itching and Rash      Assessment & Plan:  The patient presents for her first postoperative follow-up.  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  The patient reports an unremarkable postoperative course with the exception of developing a blister located just distal to her knee and proximal to her incision.  This blister popped approximately 1 week ago and she is now left with an eschar to the area.  The patient denies any erythema, foul odor or drainage from the area.  The patient has not been receiving any wound care to the area since she has been at [facility].  The patient will be leaving [facility] today and returning home with visiting nurse services for continued wound care.  The patient is asking for more pain medication.  The patient denies any  fever, nausea vomiting.  1. 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 The patient now has an eschar at the site. I will prescribe the patient some Santyl to be placed to the area on a daily basis and see the patient back in 10 days.  We had a long discussion that if the Santyl does not break down the eschar site that we may have to do surgically debrided.  Her and her daughter understand this and agree with the plan. I have prescribed the patient more tramadol as she takes this during the day and more Percocet as she takes this at night for discomfort.  She has taken the previous narcotics as prescribed Tramadol 50 mg 1 tab every 6 hours as needed for pain #60 Oxycodone 5 mg 1 tab every 6 hours as needed for pain #28 I will see the patient back in 10 days for a wound check The patient is to call the office sooner if she should experience any change in her wound status  2. Pure hypercholesterolemia - Stable Encouraged good control as its slows the progression of atherosclerotic disease  Current Outpatient Medications on File Prior to Visit  Medication Sig Dispense Refill  . acetaminophen (TYLENOL) 500 MG tablet Take 1,000 mg by mouth every 8 (eight) hours as needed for mild pain or headache.    Marland Kitchen apixaban (ELIQUIS) 5 MG TABS tablet Take 1 tablet (5 mg total) by mouth 2 (two) times daily. 60 tablet   . aspirin EC 81 MG tablet Take 1 tablet (81 mg total) by mouth daily.    . budesonide (ENTOCORT EC) 3 MG 24 hr capsule Take 3 capsules (9 mg total) by mouth daily. Taper down to 3 mg 30 capsule 0  . Calcium Carbonate-Vitamin D (CALCIUM 600+D) 600-400 MG-UNIT tablet Take 1 tablet by mouth 2 (two) times daily.     . Cranberry 500 MG CAPS Take 500 mg  by mouth daily.    Marland Kitchen diltiazem (CARDIZEM CD) 240 MG 24 hr capsule Take 240 mg by mouth  daily.    Marland Kitchen estradiol (ESTRACE) 0.1 MG/GM vaginal cream Place 1 g vaginally 3 (three) times a week. 42.5 g 12  . ferrous sulfate 325 (65 FE) MG tablet Take 325 mg by mouth daily with breakfast.    . gabapentin (NEURONTIN) 300 MG capsule Take 1 capsule (300 mg total) by mouth daily. 30 capsule 0  . gemfibrozil (LOPID) 600 MG tablet Take 600 mg by mouth 2 (two) times daily before a meal.    . hydroxychloroquine (PLAQUENIL) 200 MG tablet Take 200 mg by mouth 2 (two) times daily.     Marland Kitchen leflunomide (ARAVA) 20 MG tablet Take 20 mg by mouth daily.    Marland Kitchen lisinopril (PRINIVIL,ZESTRIL) 10 MG tablet Take 10 mg by mouth daily.    Marland Kitchen LORazepam (ATIVAN) 1 MG tablet Take 1 tablet (1 mg total) by mouth 2 (two) times daily as needed for anxiety. 10 tablet 0  . Melatonin 3 MG TABS Take by mouth at bedtime.    . Multiple Vitamin (MULTIVITAMIN) tablet Take 1 tablet by mouth daily.    . Multiple Vitamins-Minerals (PRESERVISION AREDS) CAPS Take 1 capsule by mouth 2 (two) times daily.    Marland Kitchen omeprazole (PRILOSEC) 20 MG capsule Take 20 mg by mouth 2 (two) times daily before a meal.     . pravastatin (PRAVACHOL) 80 MG tablet Take 80 mg by mouth at bedtime.     . predniSONE (DELTASONE) 5 MG tablet Take 5 mg by mouth daily with breakfast.    . sertraline (ZOLOFT) 50 MG tablet Take 50 mg by mouth at bedtime.     Marland Kitchen tiZANidine (ZANAFLEX) 4 MG tablet Take 4 mg by mouth daily as needed for muscle spasms (neck pain).     . cefdinir (OMNICEF) 300 MG capsule Take 1 capsule (300 mg total) by mouth every 12 (twelve) hours. (Patient not taking: Reported on 01/03/2018) 14 capsule 0  . cephALEXin (KEFLEX) 500 MG capsule Take 500 mg by mouth 3 (three) times daily. For 10 days - starting 11/21/2017     No current facility-administered medications on file prior to visit.    There are no Patient Instructions on file for this visit. No follow-ups on file.  KIMBERLY A STEGMAYER, PA-C

## 2018-01-04 DIAGNOSIS — Z4781 Encounter for orthopedic aftercare following surgical amputation: Secondary | ICD-10-CM | POA: Diagnosis not present

## 2018-01-04 DIAGNOSIS — I10 Essential (primary) hypertension: Secondary | ICD-10-CM | POA: Diagnosis not present

## 2018-01-04 DIAGNOSIS — K5790 Diverticulosis of intestine, part unspecified, without perforation or abscess without bleeding: Secondary | ICD-10-CM | POA: Diagnosis not present

## 2018-01-04 DIAGNOSIS — I7300 Raynaud's syndrome without gangrene: Secondary | ICD-10-CM | POA: Diagnosis not present

## 2018-01-04 DIAGNOSIS — E785 Hyperlipidemia, unspecified: Secondary | ICD-10-CM | POA: Diagnosis not present

## 2018-01-04 DIAGNOSIS — I7090 Unspecified atherosclerosis: Secondary | ICD-10-CM | POA: Diagnosis not present

## 2018-01-04 DIAGNOSIS — J449 Chronic obstructive pulmonary disease, unspecified: Secondary | ICD-10-CM | POA: Diagnosis not present

## 2018-01-04 DIAGNOSIS — D649 Anemia, unspecified: Secondary | ICD-10-CM | POA: Diagnosis not present

## 2018-01-04 DIAGNOSIS — Z89512 Acquired absence of left leg below knee: Secondary | ICD-10-CM | POA: Diagnosis not present

## 2018-01-04 DIAGNOSIS — M069 Rheumatoid arthritis, unspecified: Secondary | ICD-10-CM | POA: Diagnosis not present

## 2018-01-04 DIAGNOSIS — T8789 Other complications of amputation stump: Secondary | ICD-10-CM | POA: Diagnosis not present

## 2018-01-07 DIAGNOSIS — I7300 Raynaud's syndrome without gangrene: Secondary | ICD-10-CM | POA: Diagnosis not present

## 2018-01-07 DIAGNOSIS — R7309 Other abnormal glucose: Secondary | ICD-10-CM | POA: Diagnosis not present

## 2018-01-07 DIAGNOSIS — J449 Chronic obstructive pulmonary disease, unspecified: Secondary | ICD-10-CM | POA: Diagnosis not present

## 2018-01-07 DIAGNOSIS — Z89512 Acquired absence of left leg below knee: Secondary | ICD-10-CM | POA: Diagnosis not present

## 2018-01-07 DIAGNOSIS — Z79899 Other long term (current) drug therapy: Secondary | ICD-10-CM | POA: Diagnosis not present

## 2018-01-07 DIAGNOSIS — I7090 Unspecified atherosclerosis: Secondary | ICD-10-CM | POA: Diagnosis not present

## 2018-01-07 DIAGNOSIS — Z4781 Encounter for orthopedic aftercare following surgical amputation: Secondary | ICD-10-CM | POA: Diagnosis not present

## 2018-01-07 DIAGNOSIS — I480 Paroxysmal atrial fibrillation: Secondary | ICD-10-CM | POA: Diagnosis not present

## 2018-01-07 DIAGNOSIS — M069 Rheumatoid arthritis, unspecified: Secondary | ICD-10-CM | POA: Diagnosis not present

## 2018-01-07 DIAGNOSIS — J410 Simple chronic bronchitis: Secondary | ICD-10-CM | POA: Diagnosis not present

## 2018-01-07 DIAGNOSIS — E785 Hyperlipidemia, unspecified: Secondary | ICD-10-CM | POA: Diagnosis not present

## 2018-01-07 DIAGNOSIS — D649 Anemia, unspecified: Secondary | ICD-10-CM | POA: Diagnosis not present

## 2018-01-07 DIAGNOSIS — I10 Essential (primary) hypertension: Secondary | ICD-10-CM | POA: Diagnosis not present

## 2018-01-08 DIAGNOSIS — I7090 Unspecified atherosclerosis: Secondary | ICD-10-CM | POA: Diagnosis not present

## 2018-01-08 DIAGNOSIS — Z4781 Encounter for orthopedic aftercare following surgical amputation: Secondary | ICD-10-CM | POA: Diagnosis not present

## 2018-01-08 DIAGNOSIS — D649 Anemia, unspecified: Secondary | ICD-10-CM | POA: Diagnosis not present

## 2018-01-08 DIAGNOSIS — M069 Rheumatoid arthritis, unspecified: Secondary | ICD-10-CM | POA: Diagnosis not present

## 2018-01-08 DIAGNOSIS — I7300 Raynaud's syndrome without gangrene: Secondary | ICD-10-CM | POA: Diagnosis not present

## 2018-01-08 DIAGNOSIS — Z89512 Acquired absence of left leg below knee: Secondary | ICD-10-CM | POA: Diagnosis not present

## 2018-01-08 DIAGNOSIS — I10 Essential (primary) hypertension: Secondary | ICD-10-CM | POA: Diagnosis not present

## 2018-01-08 DIAGNOSIS — J449 Chronic obstructive pulmonary disease, unspecified: Secondary | ICD-10-CM | POA: Diagnosis not present

## 2018-01-08 DIAGNOSIS — E785 Hyperlipidemia, unspecified: Secondary | ICD-10-CM | POA: Diagnosis not present

## 2018-01-09 DIAGNOSIS — E785 Hyperlipidemia, unspecified: Secondary | ICD-10-CM | POA: Diagnosis not present

## 2018-01-09 DIAGNOSIS — Z89512 Acquired absence of left leg below knee: Secondary | ICD-10-CM | POA: Diagnosis not present

## 2018-01-09 DIAGNOSIS — D649 Anemia, unspecified: Secondary | ICD-10-CM | POA: Diagnosis not present

## 2018-01-09 DIAGNOSIS — I7300 Raynaud's syndrome without gangrene: Secondary | ICD-10-CM | POA: Diagnosis not present

## 2018-01-09 DIAGNOSIS — I7090 Unspecified atherosclerosis: Secondary | ICD-10-CM | POA: Diagnosis not present

## 2018-01-09 DIAGNOSIS — J449 Chronic obstructive pulmonary disease, unspecified: Secondary | ICD-10-CM | POA: Diagnosis not present

## 2018-01-09 DIAGNOSIS — I10 Essential (primary) hypertension: Secondary | ICD-10-CM | POA: Diagnosis not present

## 2018-01-09 DIAGNOSIS — Z4781 Encounter for orthopedic aftercare following surgical amputation: Secondary | ICD-10-CM | POA: Diagnosis not present

## 2018-01-09 DIAGNOSIS — M069 Rheumatoid arthritis, unspecified: Secondary | ICD-10-CM | POA: Diagnosis not present

## 2018-01-11 DIAGNOSIS — Z89512 Acquired absence of left leg below knee: Secondary | ICD-10-CM | POA: Diagnosis not present

## 2018-01-11 DIAGNOSIS — I7300 Raynaud's syndrome without gangrene: Secondary | ICD-10-CM | POA: Diagnosis not present

## 2018-01-11 DIAGNOSIS — Z4781 Encounter for orthopedic aftercare following surgical amputation: Secondary | ICD-10-CM | POA: Diagnosis not present

## 2018-01-11 DIAGNOSIS — E785 Hyperlipidemia, unspecified: Secondary | ICD-10-CM | POA: Diagnosis not present

## 2018-01-11 DIAGNOSIS — I7090 Unspecified atherosclerosis: Secondary | ICD-10-CM | POA: Diagnosis not present

## 2018-01-11 DIAGNOSIS — J449 Chronic obstructive pulmonary disease, unspecified: Secondary | ICD-10-CM | POA: Diagnosis not present

## 2018-01-11 DIAGNOSIS — D649 Anemia, unspecified: Secondary | ICD-10-CM | POA: Diagnosis not present

## 2018-01-11 DIAGNOSIS — M069 Rheumatoid arthritis, unspecified: Secondary | ICD-10-CM | POA: Diagnosis not present

## 2018-01-11 DIAGNOSIS — I10 Essential (primary) hypertension: Secondary | ICD-10-CM | POA: Diagnosis not present

## 2018-01-14 ENCOUNTER — Ambulatory Visit (INDEPENDENT_AMBULATORY_CARE_PROVIDER_SITE_OTHER): Payer: Medicare HMO | Admitting: Vascular Surgery

## 2018-01-14 ENCOUNTER — Encounter (INDEPENDENT_AMBULATORY_CARE_PROVIDER_SITE_OTHER): Payer: Self-pay

## 2018-01-14 ENCOUNTER — Encounter (INDEPENDENT_AMBULATORY_CARE_PROVIDER_SITE_OTHER): Payer: Self-pay | Admitting: Vascular Surgery

## 2018-01-14 ENCOUNTER — Other Ambulatory Visit (INDEPENDENT_AMBULATORY_CARE_PROVIDER_SITE_OTHER): Payer: Self-pay | Admitting: Vascular Surgery

## 2018-01-14 VITALS — BP 122/68 | HR 64 | Resp 16

## 2018-01-14 DIAGNOSIS — J449 Chronic obstructive pulmonary disease, unspecified: Secondary | ICD-10-CM | POA: Diagnosis not present

## 2018-01-14 DIAGNOSIS — M069 Rheumatoid arthritis, unspecified: Secondary | ICD-10-CM | POA: Diagnosis not present

## 2018-01-14 DIAGNOSIS — L89890 Pressure ulcer of other site, unstageable: Secondary | ICD-10-CM | POA: Insufficient documentation

## 2018-01-14 DIAGNOSIS — K219 Gastro-esophageal reflux disease without esophagitis: Secondary | ICD-10-CM

## 2018-01-14 DIAGNOSIS — I10 Essential (primary) hypertension: Secondary | ICD-10-CM | POA: Diagnosis not present

## 2018-01-14 DIAGNOSIS — E7800 Pure hypercholesterolemia, unspecified: Secondary | ICD-10-CM

## 2018-01-14 DIAGNOSIS — E785 Hyperlipidemia, unspecified: Secondary | ICD-10-CM | POA: Diagnosis not present

## 2018-01-14 DIAGNOSIS — D649 Anemia, unspecified: Secondary | ICD-10-CM | POA: Diagnosis not present

## 2018-01-14 DIAGNOSIS — I7025 Atherosclerosis of native arteries of other extremities with ulceration: Secondary | ICD-10-CM

## 2018-01-14 DIAGNOSIS — I7090 Unspecified atherosclerosis: Secondary | ICD-10-CM | POA: Diagnosis not present

## 2018-01-14 DIAGNOSIS — Z89512 Acquired absence of left leg below knee: Secondary | ICD-10-CM | POA: Diagnosis not present

## 2018-01-14 DIAGNOSIS — I7300 Raynaud's syndrome without gangrene: Secondary | ICD-10-CM | POA: Diagnosis not present

## 2018-01-14 DIAGNOSIS — T8789 Other complications of amputation stump: Secondary | ICD-10-CM

## 2018-01-14 DIAGNOSIS — Z4781 Encounter for orthopedic aftercare following surgical amputation: Secondary | ICD-10-CM | POA: Diagnosis not present

## 2018-01-14 NOTE — Progress Notes (Addendum)
Patient ID: Melissa [name], female   DOB: Dec 27, 1940, 77 y.o.   MRN: [id number]  Chief Complaint  Patient presents with  . Wound Check    10 day check    HPI Melissa [name] is a 77 y.o. female.    Patient returns the office today for follow-up regarding blistering of the left below-knee amputation stump.  She has developed eschar in this area which is currently being treated with Santyl.  She denies pain.  She does note a small amount of blood in the area but no description of frank pus or copious drainage.  Past Medical History:  Diagnosis Date  . Anemia   . Anxiety   . Arthritis    rheumatoid  . Ataxia   . COPD (chronic obstructive pulmonary disease) ([place])   . Depression   . Diverticulosis   . Excessive falling   . GERD (gastroesophageal reflux disease)   . Hyperlipidemia   . Hypertension   . IBS (irritable bowel syndrome)   . Osteoarthritis   . Psoriasis   . Raynaud's disease without gangrene   . Raynaud's disease without gangrene   . Shortness of breath dyspnea    with exertion    Past Surgical History:  Procedure Laterality Date  . ABDOMINAL HYSTERECTOMY    . AMPUTATION Left 12/12/2017   Procedure: AMPUTATION BELOW KNEE;  Surgeon: Katha Cabal, MD;  Location: [facility] ORS;  Service: Vascular;  Laterality: Left;  . APPENDECTOMY    . BACK SURGERY     Spinal fusion with rods and screws  . CHOLECYSTECTOMY    . COLONOSCOPY WITH PROPOFOL N/A 01/29/2017   Procedure: COLONOSCOPY WITH PROPOFOL;  Surgeon: Manya Silvas, MD;  Location: [facility] ENDOSCOPY;  Service: Endoscopy;  Laterality: N/A;  . ESOPHAGOGASTRODUODENOSCOPY (EGD) WITH PROPOFOL N/A 01/29/2017   Procedure: ESOPHAGOGASTRODUODENOSCOPY (EGD) WITH PROPOFOL;  Surgeon: Manya Silvas, MD;  Location: [facility] ENDOSCOPY;  Service: Endoscopy;  Laterality: N/A;  . EYE SURGERY Bilateral    Cataract Extraction with IOL  . KNEE ARTHROSCOPY Left    partial lateral meniscectomy, debridement, excision of Baker's cyst  .  LEFT HEART CATH AND CORONARY ANGIOGRAPHY N/A 04/23/2017   Procedure: LEFT HEART CATH AND CORONARY ANGIOGRAPHY;  Surgeon: Teodoro Spray, MD;  Location: [place] CV LAB;  Service: Cardiovascular;  Laterality: N/A;  . LEFT HEART CATH AND CORONARY ANGIOGRAPHY Left 09/07/2017   Procedure: LEFT HEART CATH AND CORONARY ANGIOGRAPHY;  Surgeon: Teodoro Spray, MD;  Location: [place] CV LAB;  Service: Cardiovascular;  Laterality: Left;  . LOWER EXTREMITY ANGIOGRAPHY Left 11/27/2017   Procedure: LOWER EXTREMITY ANGIOGRAPHY;  Surgeon: Katha Cabal, MD;  Location: [place] CV LAB;  Service: Cardiovascular;  Laterality: Left;  . LOWER EXTREMITY ANGIOGRAPHY Left 12/05/2017   Procedure: LOWER EXTREMITY ANGIOGRAPHY;  Surgeon: Katha Cabal, MD;  Location: [place] CV LAB;  Service: Cardiovascular;  Laterality: Left;  . ROTATOR CUFF REPAIR Right   . TOTAL KNEE ARTHROPLASTY Left 04/04/2016   Procedure: TOTAL KNEE ARTHROPLASTY;  Surgeon: Hessie Knows, MD;  Location: [facility] ORS;  Service: Orthopedics;  Laterality: Left;      Allergies  Allergen Reactions  . Lodine [Etodolac] Hives and Itching  . Methotrexate Derivatives Other (See Comments)    Flu like symptoms  . Nitrofuran Derivatives Nausea And Vomiting and Other (See Comments)    Made sicker   . Norvasc [Amlodipine] Other (See Comments)    Unknown   . Pentasa [Mesalamine Er] Other (See  Comments)    Unknown   . Red Yeast Rice Extract Other (See Comments)    Patient preference   . Vioxx [Rofecoxib] Other (See Comments)    Unknown   . Levaquin [Levofloxacin] Itching, Rash and Other (See Comments)    Muscle pain  . Sulfa Antibiotics Itching and Rash    Current Outpatient Medications  Medication Sig Dispense Refill  . acetaminophen (TYLENOL) 500 MG tablet Take 1,000 mg by mouth every 8 (eight) hours as needed for mild pain or headache.    Marland Kitchen apixaban (ELIQUIS) 5 MG TABS tablet Take 1 tablet (5 mg total) by mouth 2 (two)  times daily. 60 tablet   . aspirin EC 81 MG tablet Take 1 tablet (81 mg total) by mouth daily.    . budesonide (ENTOCORT EC) 3 MG 24 hr capsule Take 3 capsules (9 mg total) by mouth daily. Taper down to 3 mg 30 capsule 0  . Calcium Carbonate-Vitamin D (CALCIUM 600+D) 600-400 MG-UNIT tablet Take 1 tablet by mouth 2 (two) times daily.     . cephALEXin (KEFLEX) 500 MG capsule Take 500 mg by mouth 3 (three) times daily. For 10 days - starting 11/21/2017    . collagenase (SANTYL) ointment Apply 1 application topically daily. 30 g 5  . Cranberry 500 MG CAPS Take 500 mg by mouth daily.    Marland Kitchen diltiazem (CARDIZEM CD) 240 MG 24 hr capsule Take 240 mg by mouth daily.    Marland Kitchen estradiol (ESTRACE) 0.1 MG/GM vaginal cream Place 1 g vaginally 3 (three) times a week. 42.5 g 12  . ferrous sulfate 325 (65 FE) MG tablet Take 325 mg by mouth daily with breakfast.    . gabapentin (NEURONTIN) 300 MG capsule Take 1 capsule (300 mg total) by mouth daily. 30 capsule 0  . gemfibrozil (LOPID) 600 MG tablet Take 600 mg by mouth 2 (two) times daily before a meal.    . hydroxychloroquine (PLAQUENIL) 200 MG tablet Take 200 mg by mouth 2 (two) times daily.     Marland Kitchen leflunomide (ARAVA) 20 MG tablet Take 20 mg by mouth daily.    Marland Kitchen lisinopril (PRINIVIL,ZESTRIL) 10 MG tablet Take 10 mg by mouth daily.    Marland Kitchen LORazepam (ATIVAN) 1 MG tablet Take 1 tablet (1 mg total) by mouth 2 (two) times daily as needed for anxiety. 10 tablet 0  . Melatonin 3 MG TABS Take by mouth at bedtime.    . Multiple Vitamin (MULTIVITAMIN) tablet Take 1 tablet by mouth daily.    . Multiple Vitamins-Minerals (PRESERVISION AREDS) CAPS Take 1 capsule by mouth 2 (two) times daily.    Marland Kitchen omeprazole (PRILOSEC) 20 MG capsule Take 20 mg by mouth 2 (two) times daily before a meal.     . oxyCODONE-acetaminophen (PERCOCET) 5-325 MG tablet Take 1 tablet by mouth every 6 (six) hours as needed for moderate pain or severe pain. 28 tablet 0  . oxyCODONE-acetaminophen (PERCOCET/ROXICET)  5-325 MG tablet Take 1 tablet by mouth every 4 (four) hours as needed for severe pain. 30 tablet 0  . pravastatin (PRAVACHOL) 80 MG tablet Take 80 mg by mouth at bedtime.     . predniSONE (DELTASONE) 5 MG tablet Take 5 mg by mouth daily with breakfast.    . sertraline (ZOLOFT) 50 MG tablet Take 50 mg by mouth at bedtime.     Marland Kitchen tiZANidine (ZANAFLEX) 4 MG tablet Take 4 mg by mouth daily as needed for muscle spasms (neck pain).     Marland Kitchen  traMADol (ULTRAM) 50 MG tablet Take 1 tablet (50 mg total) by mouth every 6 (six) hours as needed. 20 tablet 0  . traMADol (ULTRAM) 50 MG tablet Take 1 tablet (50 mg total) by mouth every 6 (six) hours as needed for moderate pain or severe pain. 60 tablet 5  . cefdinir (OMNICEF) 300 MG capsule Take 1 capsule (300 mg total) by mouth every 12 (twelve) hours. (Patient not taking: Reported on 01/03/2018) 14 capsule 0   No current facility-administered medications for this visit.         Physical Exam BP 122/68 (BP Location: Right Arm)   Pulse 64   Resp 16  Gen:  WD/WN, NAD Skin: incision clean dry area of eschar over the anterior inferior portion of the left below-knee stump this measures 5 cm x 4 cm x 3-4 mm and is roughly circular, These measurements are from today 01/14/2018.  There does not appear to be any cellulitic changes.  There is a 2+ palpable popliteal pulse.  Assessment/Plan: 1. Atherosclerosis of native arteries of the extremities with ulceration ([place]) I have discussed the wound with the patient.  Options for treatment included continued Santyl which would undoubtedly be a prolonged course or operative debridement with placement of a VAC.  The patient would like to move forward with the [facility] dressing.  The risks and benefits were reviewed.  All questions have been answered and the patient agrees to proceed.  Currently the patient is tolerating a regular diet and doing well.  2. Essential hypertension Continue antihypertensive medications as already  ordered, these medications have been reviewed and there are no changes at this time.   3. Gastroesophageal reflux disease without esophagitis Continue antihypertensive medications as already ordered, these medications have been reviewed and there are no changes at this time.  Avoidence of caffeine and alcohol  Moderate elevation of the head of the bed   4. Pure hypercholesterolemia Continue statin as ordered and reviewed, no changes at this time       [name] 01/14/2018, 2:12 PM   This note was created with Dragon medical transcription system.  Any errors from dictation are unintentional.

## 2018-01-15 DIAGNOSIS — I7090 Unspecified atherosclerosis: Secondary | ICD-10-CM | POA: Diagnosis not present

## 2018-01-15 DIAGNOSIS — E785 Hyperlipidemia, unspecified: Secondary | ICD-10-CM | POA: Diagnosis not present

## 2018-01-15 DIAGNOSIS — D649 Anemia, unspecified: Secondary | ICD-10-CM | POA: Diagnosis not present

## 2018-01-15 DIAGNOSIS — M069 Rheumatoid arthritis, unspecified: Secondary | ICD-10-CM | POA: Diagnosis not present

## 2018-01-15 DIAGNOSIS — Z89512 Acquired absence of left leg below knee: Secondary | ICD-10-CM | POA: Diagnosis not present

## 2018-01-15 DIAGNOSIS — I10 Essential (primary) hypertension: Secondary | ICD-10-CM | POA: Diagnosis not present

## 2018-01-15 DIAGNOSIS — I7300 Raynaud's syndrome without gangrene: Secondary | ICD-10-CM | POA: Diagnosis not present

## 2018-01-15 DIAGNOSIS — J449 Chronic obstructive pulmonary disease, unspecified: Secondary | ICD-10-CM | POA: Diagnosis not present

## 2018-01-15 DIAGNOSIS — Z4781 Encounter for orthopedic aftercare following surgical amputation: Secondary | ICD-10-CM | POA: Diagnosis not present

## 2018-01-16 DIAGNOSIS — Z4781 Encounter for orthopedic aftercare following surgical amputation: Secondary | ICD-10-CM | POA: Diagnosis not present

## 2018-01-16 DIAGNOSIS — E785 Hyperlipidemia, unspecified: Secondary | ICD-10-CM | POA: Diagnosis not present

## 2018-01-16 DIAGNOSIS — J449 Chronic obstructive pulmonary disease, unspecified: Secondary | ICD-10-CM | POA: Diagnosis not present

## 2018-01-16 DIAGNOSIS — I10 Essential (primary) hypertension: Secondary | ICD-10-CM | POA: Diagnosis not present

## 2018-01-16 DIAGNOSIS — I7090 Unspecified atherosclerosis: Secondary | ICD-10-CM | POA: Diagnosis not present

## 2018-01-16 DIAGNOSIS — I7300 Raynaud's syndrome without gangrene: Secondary | ICD-10-CM | POA: Diagnosis not present

## 2018-01-16 DIAGNOSIS — Z89512 Acquired absence of left leg below knee: Secondary | ICD-10-CM | POA: Diagnosis not present

## 2018-01-16 DIAGNOSIS — D649 Anemia, unspecified: Secondary | ICD-10-CM | POA: Diagnosis not present

## 2018-01-16 DIAGNOSIS — M069 Rheumatoid arthritis, unspecified: Secondary | ICD-10-CM | POA: Diagnosis not present

## 2018-01-17 DIAGNOSIS — I7090 Unspecified atherosclerosis: Secondary | ICD-10-CM | POA: Diagnosis not present

## 2018-01-17 DIAGNOSIS — I10 Essential (primary) hypertension: Secondary | ICD-10-CM | POA: Diagnosis not present

## 2018-01-17 DIAGNOSIS — D649 Anemia, unspecified: Secondary | ICD-10-CM | POA: Diagnosis not present

## 2018-01-17 DIAGNOSIS — J449 Chronic obstructive pulmonary disease, unspecified: Secondary | ICD-10-CM | POA: Diagnosis not present

## 2018-01-17 DIAGNOSIS — M069 Rheumatoid arthritis, unspecified: Secondary | ICD-10-CM | POA: Diagnosis not present

## 2018-01-17 DIAGNOSIS — E785 Hyperlipidemia, unspecified: Secondary | ICD-10-CM | POA: Diagnosis not present

## 2018-01-17 DIAGNOSIS — Z4781 Encounter for orthopedic aftercare following surgical amputation: Secondary | ICD-10-CM | POA: Diagnosis not present

## 2018-01-17 DIAGNOSIS — Z89512 Acquired absence of left leg below knee: Secondary | ICD-10-CM | POA: Diagnosis not present

## 2018-01-17 DIAGNOSIS — I7300 Raynaud's syndrome without gangrene: Secondary | ICD-10-CM | POA: Diagnosis not present

## 2018-01-21 ENCOUNTER — Encounter
Admission: RE | Admit: 2018-01-21 | Discharge: 2018-01-21 | Disposition: A | Discharge status: Home / Self Care | Payer: Medicare HMO | Source: Ambulatory Visit | Attending: Vascular Surgery | Admitting: Vascular Surgery

## 2018-01-21 ENCOUNTER — Other Ambulatory Visit: Payer: Self-pay

## 2018-01-21 DIAGNOSIS — Z89512 Acquired absence of left leg below knee: Secondary | ICD-10-CM | POA: Diagnosis not present

## 2018-01-21 DIAGNOSIS — I10 Essential (primary) hypertension: Secondary | ICD-10-CM | POA: Diagnosis not present

## 2018-01-21 DIAGNOSIS — Z01812 Encounter for preprocedural laboratory examination: Secondary | ICD-10-CM | POA: Insufficient documentation

## 2018-01-21 DIAGNOSIS — J449 Chronic obstructive pulmonary disease, unspecified: Secondary | ICD-10-CM | POA: Diagnosis not present

## 2018-01-21 DIAGNOSIS — D649 Anemia, unspecified: Secondary | ICD-10-CM | POA: Diagnosis not present

## 2018-01-21 DIAGNOSIS — Z01810 Encounter for preprocedural cardiovascular examination: Secondary | ICD-10-CM | POA: Diagnosis not present

## 2018-01-21 DIAGNOSIS — Z4781 Encounter for orthopedic aftercare following surgical amputation: Secondary | ICD-10-CM | POA: Diagnosis not present

## 2018-01-21 DIAGNOSIS — M069 Rheumatoid arthritis, unspecified: Secondary | ICD-10-CM | POA: Diagnosis not present

## 2018-01-21 DIAGNOSIS — K5790 Diverticulosis of intestine, part unspecified, without perforation or abscess without bleeding: Secondary | ICD-10-CM | POA: Diagnosis not present

## 2018-01-21 DIAGNOSIS — I7300 Raynaud's syndrome without gangrene: Secondary | ICD-10-CM | POA: Diagnosis not present

## 2018-01-21 DIAGNOSIS — I7090 Unspecified atherosclerosis: Secondary | ICD-10-CM | POA: Diagnosis not present

## 2018-01-21 HISTORY — DX: Atherosclerotic heart disease of native coronary artery without angina pectoris: I25.10

## 2018-01-21 HISTORY — DX: Personal history of other medical treatment: Z92.89

## 2018-01-21 HISTORY — DX: Pure hypercholesterolemia, unspecified: E78.00

## 2018-01-21 HISTORY — DX: Non-pressure chronic ulcer of other part of right foot with unspecified severity: L97.519

## 2018-01-21 HISTORY — DX: Cardiac arrhythmia, unspecified: I49.9

## 2018-01-21 HISTORY — DX: Carrier or suspected carrier of methicillin resistant Staphylococcus aureus: Z22.322

## 2018-01-21 HISTORY — DX: Acute myocardial infarction, unspecified: I21.9

## 2018-01-21 LAB — CBC WITH DIFFERENTIAL/PLATELET
BASOS PCT: 1 %
Basophils Absolute: 0.1 10*3/uL (ref 0–0.1)
EOS ABS: 0.3 10*3/uL (ref 0–0.7)
EOS PCT: 2 %
HCT: 34.3 % — ABNORMAL LOW (ref 35.0–47.0)
Hemoglobin: 11.4 g/dL — ABNORMAL LOW (ref 12.0–16.0)
LYMPHS PCT: 7 %
Lymphs Abs: 0.8 10*3/uL — ABNORMAL LOW (ref 1.0–3.6)
MCH: 31.1 pg (ref 26.0–34.0)
MCHC: 33.4 g/dL (ref 32.0–36.0)
MCV: 93.2 fL (ref 80.0–100.0)
Monocytes Absolute: 0.9 10*3/uL (ref 0.2–0.9)
Monocytes Relative: 9 %
NEUTROS ABS: 9 10*3/uL — AB (ref 1.4–6.5)
NEUTROS PCT: 81 %
PLATELETS: 300 10*3/uL (ref 150–440)
RBC: 3.68 MIL/uL — ABNORMAL LOW (ref 3.80–5.20)
RDW: 16 % — AB (ref 11.5–14.5)
WBC: 11.1 10*3/uL — ABNORMAL HIGH (ref 3.6–11.0)

## 2018-01-21 LAB — BASIC METABOLIC PANEL
Anion gap: 10 (ref 5–15)
BUN: 25 mg/dL — ABNORMAL HIGH (ref 8–23)
CHLORIDE: 104 mmol/L (ref 98–111)
CO2: 25 mmol/L (ref 22–32)
Calcium: 9.2 mg/dL (ref 8.9–10.3)
Creatinine, Ser: 0.56 mg/dL (ref 0.44–1.00)
GFR calc non Af Amer: >60 mL/min (ref >60)
Glucose, Bld: 130 mg/dL — ABNORMAL HIGH (ref 70–99)
POTASSIUM: 3.8 mmol/L (ref 3.5–5.1)
Sodium: 139 mmol/L (ref 135–145)

## 2018-01-21 LAB — APTT: aPTT: 33 seconds (ref 24–36)

## 2018-01-21 LAB — PROTIME-INR
INR: 1.21
PROTHROMBIN TIME: 15.2 s (ref 11.4–15.2)

## 2018-01-21 LAB — TYPE AND SCREEN
ABO/RH(D): A POS
Antibody Screen: NEGATIVE
Extend sample reason: TRANSFUSED

## 2018-01-21 MED ORDER — LACTATED RINGERS IV SOLN: INTRAVENOUS | Status: DC

## 2018-01-21 NOTE — Patient Instructions (Addendum)
Your procedure is scheduled on: Friday, January 25, 2018  Report to [facility]    DO NOT STOP ON THE FIRST FLOOR TO REGISTER  To find out your arrival time please call [phone] between 1PM - 3PM on Thursday, January 24, 2018  Remember: Instructions that are not followed completely may result in serious medical risk,  up to and including death, or upon the discretion of your surgeon and anesthesiologist your  surgery may need to be rescheduled.     _X__ 1. Do not eat food after midnight the night before your procedure.                 No gum chewing or hard candies.NOTHING SOLID IN YOUR MOUTH AFTER MIDNIGHT                  You may drink clear liquids up to 2 hours before you are scheduled to arrive for your surgery-                   DO not drink clear liquids within 2 hours of the start of your surgery.                  Clear Liquids include:  water, apple juice without pulp, clear carbohydrate                 drink such as Clearfast of Gatorade, Black Coffee or Tea (Do not add                 anything to coffee or tea).  __X__2.  On the morning of surgery brush your teeth with toothpaste and water, you                may rinse your mouth with mouthwash if you wish.                      Do not swallow any toothpaste of mouthwash.     _X__ 3.  No Alcohol for 24 hours before or after surgery.   _X__ 4.  Do Not Smoke or use e-cigarettes For 24 Hours Prior to Your Surgery.                 Do not use any chewable tobacco products for at least 6 hours prior to                 surgery.  ____  5.  Bring all medications with you on the day of surgery if instructed.   ____  6.  Notify your doctor if there is any change in your medical condition      (cold, fever, infections).     Do not wear jewelry, make-up, hairpins, clips or nail polish. Do not wear lotions, powders, or perfumes. You may wear deodorant. Do not shave 48 hours prior to  surgery. Men may shave face and neck. Do not bring valuables to the hospital.    [facility] is not responsible for any belongings or valuables.  Contacts, dentures or bridgework may not be worn into surgery. Leave your suitcase in the car. After surgery it may be brought to your room. For patients admitted to the hospital, discharge time is determined by your treatment team.   Patients discharged the day of surgery will not be allowed to drive home.   Please read over the following fact sheets that you were given:  PREPARING FOR SURGERY    ____ Take these medicines the morning of surgery with A SIP OF WATER:    1. CARDIZEM  2. PRILOSEC  3. TRAMADOL OR PERCOCET IF NEEDED  4. ATIVAN, IF NEEDED  5. PLAQUENIL  6. ARAVA  ____ Fleet Enema (as directed)   __X__ Use CHG SAGE WIPES AS DIRECTED  __X__ Stop Coumadin/Plavix/aspirin AS PER DR. FATH  __X__ Stop Anti-inflammatories AS OF TODAY   _X___ Stop supplements until after surgery.                 THIS INCLUDES CALCIUM PLUS D / CRANBERRY / IRON / MELATONIN / MULTIVITS / AREDS PRESERVISION  ____ Bring C-Pap to the hospital.   CONTINUE TO TAKE GEMFIBROZIL AND LISINOPRIL AND ENTOCORT BUT DO NOT TAKE ON THE MORNING OF SURGERY  TAKE PRAVACHOL / GEMFIBROZIL / ZOLOFT / PRILOSEC  AS USUAL AT NIGHT          DO NOT TAKE PRAVACHOL / GEMFIBROZIL OR ZOLOFT ON THE MORNING OF SURGERY  CONTINUE TO USE SANTYL ON WOUND AS USUAL. DO NOT ADD FRESH CREAM ON MORNING OF SURGERY

## 2018-01-22 ENCOUNTER — Telehealth (INDEPENDENT_AMBULATORY_CARE_PROVIDER_SITE_OTHER): Payer: Self-pay

## 2018-01-22 DIAGNOSIS — Z89512 Acquired absence of left leg below knee: Secondary | ICD-10-CM | POA: Diagnosis not present

## 2018-01-22 DIAGNOSIS — I10 Essential (primary) hypertension: Secondary | ICD-10-CM | POA: Diagnosis not present

## 2018-01-22 DIAGNOSIS — I7090 Unspecified atherosclerosis: Secondary | ICD-10-CM | POA: Diagnosis not present

## 2018-01-22 DIAGNOSIS — M069 Rheumatoid arthritis, unspecified: Secondary | ICD-10-CM | POA: Diagnosis not present

## 2018-01-22 DIAGNOSIS — J449 Chronic obstructive pulmonary disease, unspecified: Secondary | ICD-10-CM | POA: Diagnosis not present

## 2018-01-22 DIAGNOSIS — D649 Anemia, unspecified: Secondary | ICD-10-CM | POA: Diagnosis not present

## 2018-01-22 DIAGNOSIS — Z4781 Encounter for orthopedic aftercare following surgical amputation: Secondary | ICD-10-CM | POA: Diagnosis not present

## 2018-01-22 DIAGNOSIS — E785 Hyperlipidemia, unspecified: Secondary | ICD-10-CM | POA: Diagnosis not present

## 2018-01-22 DIAGNOSIS — T8789 Other complications of amputation stump: Secondary | ICD-10-CM | POA: Diagnosis not present

## 2018-01-22 DIAGNOSIS — I7300 Raynaud's syndrome without gangrene: Secondary | ICD-10-CM | POA: Diagnosis not present

## 2018-01-22 LAB — SURGICAL PCR SCREEN
MRSA, PCR: POSITIVE — AB
STAPHYLOCOCCUS AUREUS: POSITIVE — AB

## 2018-01-22 NOTE — Telephone Encounter (Signed)
Judeen Hammans called and stated that she faxed over this patient's results which she was positive for staph. We did receive the fax and it's on Dr. Nino Parsley desk.

## 2018-01-22 NOTE — Pre-Procedure Instructions (Addendum)
POSITIVE MRSA/STAPH FAXED TO DR [facility]. LM ON NURSES LINE

## 2018-01-24 DIAGNOSIS — D649 Anemia, unspecified: Secondary | ICD-10-CM | POA: Diagnosis not present

## 2018-01-24 DIAGNOSIS — Z89512 Acquired absence of left leg below knee: Secondary | ICD-10-CM | POA: Diagnosis not present

## 2018-01-24 DIAGNOSIS — I7300 Raynaud's syndrome without gangrene: Secondary | ICD-10-CM | POA: Diagnosis not present

## 2018-01-24 DIAGNOSIS — Z4781 Encounter for orthopedic aftercare following surgical amputation: Secondary | ICD-10-CM | POA: Diagnosis not present

## 2018-01-24 DIAGNOSIS — E785 Hyperlipidemia, unspecified: Secondary | ICD-10-CM | POA: Diagnosis not present

## 2018-01-24 DIAGNOSIS — I10 Essential (primary) hypertension: Secondary | ICD-10-CM | POA: Diagnosis not present

## 2018-01-24 DIAGNOSIS — I7090 Unspecified atherosclerosis: Secondary | ICD-10-CM | POA: Diagnosis not present

## 2018-01-24 DIAGNOSIS — M069 Rheumatoid arthritis, unspecified: Secondary | ICD-10-CM | POA: Diagnosis not present

## 2018-01-24 DIAGNOSIS — J449 Chronic obstructive pulmonary disease, unspecified: Secondary | ICD-10-CM | POA: Diagnosis not present

## 2018-01-25 ENCOUNTER — Encounter
Admission: RE | Disposition: A | Discharge status: Home / Self Care | Payer: Self-pay | Source: Ambulatory Visit | Attending: Vascular Surgery

## 2018-01-25 ENCOUNTER — Other Ambulatory Visit: Payer: Self-pay

## 2018-01-25 ENCOUNTER — Encounter: Payer: Self-pay | Admitting: *Deleted

## 2018-01-25 ENCOUNTER — Ambulatory Visit: Payer: Medicare HMO | Admitting: Anesthesiology

## 2018-01-25 ENCOUNTER — Ambulatory Visit
Admission: RE | Admit: 2018-01-25 | Discharge: 2018-01-25 | Disposition: A | Discharge status: Home / Self Care | Payer: Medicare HMO | Source: Ambulatory Visit | Attending: Vascular Surgery | Admitting: Vascular Surgery

## 2018-01-25 DIAGNOSIS — Z7982 Long term (current) use of aspirin: Secondary | ICD-10-CM | POA: Insufficient documentation

## 2018-01-25 DIAGNOSIS — Z981 Arthrodesis status: Secondary | ICD-10-CM | POA: Insufficient documentation

## 2018-01-25 DIAGNOSIS — Z888 Allergy status to other drugs, medicaments and biological substances status: Secondary | ICD-10-CM | POA: Diagnosis not present

## 2018-01-25 DIAGNOSIS — Z7902 Long term (current) use of antithrombotics/antiplatelets: Secondary | ICD-10-CM | POA: Diagnosis not present

## 2018-01-25 DIAGNOSIS — F419 Anxiety disorder, unspecified: Secondary | ICD-10-CM | POA: Insufficient documentation

## 2018-01-25 DIAGNOSIS — F329 Major depressive disorder, single episode, unspecified: Secondary | ICD-10-CM | POA: Insufficient documentation

## 2018-01-25 DIAGNOSIS — I2510 Atherosclerotic heart disease of native coronary artery without angina pectoris: Secondary | ICD-10-CM | POA: Diagnosis not present

## 2018-01-25 DIAGNOSIS — T8189XA Other complications of procedures, not elsewhere classified, initial encounter: Secondary | ICD-10-CM | POA: Diagnosis not present

## 2018-01-25 DIAGNOSIS — K219 Gastro-esophageal reflux disease without esophagitis: Secondary | ICD-10-CM | POA: Diagnosis not present

## 2018-01-25 DIAGNOSIS — Z79891 Long term (current) use of opiate analgesic: Secondary | ICD-10-CM | POA: Diagnosis not present

## 2018-01-25 DIAGNOSIS — Z89512 Acquired absence of left leg below knee: Secondary | ICD-10-CM | POA: Insufficient documentation

## 2018-01-25 DIAGNOSIS — Y835 Amputation of limb(s) as the cause of abnormal reaction of the patient, or of later complication, without mention of misadventure at the time of the procedure: Secondary | ICD-10-CM | POA: Insufficient documentation

## 2018-01-25 DIAGNOSIS — Z882 Allergy status to sulfonamides status: Secondary | ICD-10-CM | POA: Diagnosis not present

## 2018-01-25 DIAGNOSIS — I252 Old myocardial infarction: Secondary | ICD-10-CM | POA: Insufficient documentation

## 2018-01-25 DIAGNOSIS — I70249 Atherosclerosis of native arteries of left leg with ulceration of unspecified site: Secondary | ICD-10-CM | POA: Diagnosis not present

## 2018-01-25 DIAGNOSIS — Z7952 Long term (current) use of systemic steroids: Secondary | ICD-10-CM | POA: Diagnosis not present

## 2018-01-25 DIAGNOSIS — Z79890 Hormone replacement therapy (postmenopausal): Secondary | ICD-10-CM | POA: Diagnosis not present

## 2018-01-25 DIAGNOSIS — Z9049 Acquired absence of other specified parts of digestive tract: Secondary | ICD-10-CM | POA: Insufficient documentation

## 2018-01-25 DIAGNOSIS — D649 Anemia, unspecified: Secondary | ICD-10-CM | POA: Insufficient documentation

## 2018-01-25 DIAGNOSIS — J449 Chronic obstructive pulmonary disease, unspecified: Secondary | ICD-10-CM | POA: Diagnosis not present

## 2018-01-25 DIAGNOSIS — I7300 Raynaud's syndrome without gangrene: Secondary | ICD-10-CM | POA: Insufficient documentation

## 2018-01-25 DIAGNOSIS — Z79899 Other long term (current) drug therapy: Secondary | ICD-10-CM | POA: Insufficient documentation

## 2018-01-25 DIAGNOSIS — M069 Rheumatoid arthritis, unspecified: Secondary | ICD-10-CM | POA: Diagnosis not present

## 2018-01-25 DIAGNOSIS — Z9102 Food additives allergy status: Secondary | ICD-10-CM | POA: Insufficient documentation

## 2018-01-25 DIAGNOSIS — T8754 Necrosis of amputation stump, left lower extremity: Secondary | ICD-10-CM | POA: Diagnosis not present

## 2018-01-25 DIAGNOSIS — I96 Gangrene, not elsewhere classified: Secondary | ICD-10-CM | POA: Diagnosis present

## 2018-01-25 DIAGNOSIS — I10 Essential (primary) hypertension: Secondary | ICD-10-CM | POA: Diagnosis not present

## 2018-01-25 DIAGNOSIS — L409 Psoriasis, unspecified: Secondary | ICD-10-CM | POA: Insufficient documentation

## 2018-01-25 DIAGNOSIS — E7800 Pure hypercholesterolemia, unspecified: Secondary | ICD-10-CM | POA: Diagnosis not present

## 2018-01-25 DIAGNOSIS — Z886 Allergy status to analgesic agent status: Secondary | ICD-10-CM | POA: Diagnosis not present

## 2018-01-25 DIAGNOSIS — I4891 Unspecified atrial fibrillation: Secondary | ICD-10-CM | POA: Diagnosis not present

## 2018-01-25 DIAGNOSIS — E785 Hyperlipidemia, unspecified: Secondary | ICD-10-CM | POA: Diagnosis not present

## 2018-01-25 DIAGNOSIS — I739 Peripheral vascular disease, unspecified: Secondary | ICD-10-CM | POA: Diagnosis not present

## 2018-01-25 DIAGNOSIS — F418 Other specified anxiety disorders: Secondary | ICD-10-CM | POA: Diagnosis not present

## 2018-01-25 HISTORY — PX: WOUND DEBRIDEMENT: SHX247

## 2018-01-25 HISTORY — PX: APPLICATION OF WOUND VAC: SHX5189

## 2018-01-25 LAB — TYPE AND SCREEN
ABO/RH(D): A POS
Antibody Screen: NEGATIVE

## 2018-01-25 SURGERY — DEBRIDEMENT, WOUND
Anesthesia: General | Laterality: Left

## 2018-01-25 MED ORDER — CEFAZOLIN SODIUM-DEXTROSE 2-4 GM/100ML-% IV SOLN: 2.0000 g | INTRAVENOUS | Status: DC

## 2018-01-25 MED ORDER — VANCOMYCIN HCL 1000 MG IV SOLR: 1000.0000 mg | Freq: Once | INTRAVENOUS | Status: DC

## 2018-01-25 MED ORDER — LACTATED RINGERS IV SOLN
INTRAVENOUS | Status: DC
  Administered 2018-01-25: 14:00:00 via INTRAVENOUS

## 2018-01-25 MED ORDER — BUPIVACAINE-EPINEPHRINE 0.25% -1:200000 IJ SOLN
INTRAMUSCULAR | Status: DC | PRN
  Administered 2018-01-25: 30 mL

## 2018-01-25 MED ORDER — VANCOMYCIN HCL IN DEXTROSE 1-5 GM/200ML-% IV SOLN
INTRAVENOUS | Status: AC
  Administered 2018-01-25: 1000 mg via INTRAVENOUS
  Filled 2018-01-25: qty 200

## 2018-01-25 MED ORDER — ONDANSETRON HCL 4 MG/2ML IJ SOLN: 4.0000 mg | Freq: Once | INTRAMUSCULAR | Status: DC | PRN

## 2018-01-25 MED ORDER — CHLORHEXIDINE GLUCONATE CLOTH 2 % EX PADS: 6.0000 | MEDICATED_PAD | Freq: Once | CUTANEOUS | Status: DC

## 2018-01-25 MED ORDER — PROPOFOL 10 MG/ML IV BOLUS
INTRAVENOUS | Status: AC
  Filled 2018-01-25: qty 20

## 2018-01-25 MED ORDER — TRAMADOL HCL 50 MG PO TABS: 50.0000 mg | ORAL_TABLET | Freq: Four times a day (QID) | ORAL | 0 refills | Status: DC | PRN

## 2018-01-25 MED ORDER — LIDOCAINE HCL (PF) 2 % IJ SOLN
INTRAMUSCULAR | Status: AC
  Filled 2018-01-25: qty 10

## 2018-01-25 MED ORDER — LORAZEPAM 1 MG PO TABS
1.0000 mg | ORAL_TABLET | Freq: Once | ORAL | Status: AC
  Administered 2018-01-25: 1 mg via ORAL
  Filled 2018-01-25: qty 1

## 2018-01-25 MED ORDER — VANCOMYCIN HCL IN DEXTROSE 1-5 GM/200ML-% IV SOLN
1000.0000 mg | Freq: Once | INTRAVENOUS | Status: AC
  Administered 2018-01-25: 1000 mg via INTRAVENOUS

## 2018-01-25 MED ORDER — FENTANYL CITRATE (PF) 100 MCG/2ML IJ SOLN: 25.0000 ug | INTRAMUSCULAR | Status: DC | PRN

## 2018-01-25 MED ORDER — FENTANYL CITRATE (PF) 100 MCG/2ML IJ SOLN
INTRAMUSCULAR | Status: DC | PRN
  Administered 2018-01-25 (×3): 25 ug via INTRAVENOUS

## 2018-01-25 MED ORDER — LIDOCAINE HCL (CARDIAC) PF 100 MG/5ML IV SOSY
PREFILLED_SYRINGE | INTRAVENOUS | Status: DC | PRN
  Administered 2018-01-25: 60 mg via INTRAVENOUS

## 2018-01-25 MED ORDER — PROPOFOL 10 MG/ML IV BOLUS
INTRAVENOUS | Status: DC | PRN
  Administered 2018-01-25: 100 mg via INTRAVENOUS

## 2018-01-25 MED ORDER — FENTANYL CITRATE (PF) 100 MCG/2ML IJ SOLN
INTRAMUSCULAR | Status: AC
  Filled 2018-01-25: qty 2

## 2018-01-25 MED ORDER — ONDANSETRON HCL 4 MG/2ML IJ SOLN
INTRAMUSCULAR | Status: DC | PRN
  Administered 2018-01-25: 4 mg via INTRAVENOUS

## 2018-01-25 MED ORDER — MIDAZOLAM HCL 2 MG/2ML IJ SOLN
INTRAMUSCULAR | Status: AC
  Filled 2018-01-25: qty 2

## 2018-01-25 SURGICAL SUPPLY — 25 items
BNDG COHESIVE 6X5 TAN STRL LF (GAUZE/BANDAGES/DRESSINGS) ×3 IMPLANT
CANISTER SUCT 1200ML W/VALVE (MISCELLANEOUS) ×3 IMPLANT
CHLORAPREP W/TINT 26ML (MISCELLANEOUS) ×3 IMPLANT
DRSG EMULSION OIL 3X3 NADH (GAUZE/BANDAGES/DRESSINGS) ×3 IMPLANT
DRSG MEPITEL 4X7.2 (GAUZE/BANDAGES/DRESSINGS) ×2 IMPLANT
DRSG VAC ATS MED SENSATRAC (GAUZE/BANDAGES/DRESSINGS) ×3 IMPLANT
ELECT REM PT RETURN 9FT ADLT (ELECTROSURGICAL) ×3
ELECTRODE REM PT RTRN 9FT ADLT (ELECTROSURGICAL) ×1 IMPLANT
GAUZE SPONGE 4X4 12PLY STRL (GAUZE/BANDAGES/DRESSINGS) IMPLANT
GAUZE STRETCH 2X75IN STRL (MISCELLANEOUS) IMPLANT
GLOVE SURG SYN 8.0 (GLOVE) ×3 IMPLANT
GLOVE SURG SYN 8.0 PF PI (GLOVE) ×1 IMPLANT
GOWN STRL REUS W/ TWL LRG LVL3 (GOWN DISPOSABLE) ×1 IMPLANT
GOWN STRL REUS W/ TWL XL LVL3 (GOWN DISPOSABLE) ×1 IMPLANT
GOWN STRL REUS W/TWL LRG LVL3 (GOWN DISPOSABLE) ×2
GOWN STRL REUS W/TWL XL LVL3 (GOWN DISPOSABLE) ×2
KIT TURNOVER KIT A (KITS) ×3 IMPLANT
LABEL OR SOLS (LABEL) ×3 IMPLANT
NS IRRIG 500ML POUR BTL (IV SOLUTION) ×3 IMPLANT
PACK EXTREMITY ARMC (MISCELLANEOUS) ×3 IMPLANT
PAD PREP 24X41 OB/GYN DISP (PERSONAL CARE ITEMS) ×3 IMPLANT
SOL PREP PVP 2OZ (MISCELLANEOUS) ×3
SOLUTION PREP PVP 2OZ (MISCELLANEOUS) ×1 IMPLANT
STOCKINETTE IMPERV 14X48 (MISCELLANEOUS) ×3 IMPLANT
WND VAC CANISTER 500ML (MISCELLANEOUS) ×3 IMPLANT

## 2018-01-25 NOTE — Anesthesia Post-op Follow-up Note (Signed)
Anesthesia QCDR form completed.        

## 2018-01-25 NOTE — Anesthesia Preprocedure Evaluation (Addendum)
Anesthesia Evaluation  Patient identified by MRN, date of birth, ID band Patient awake    Reviewed: Allergy & Precautions, NPO status , Patient's Chart, lab work & pertinent test results, reviewed documented beta blocker date and time   Airway Mallampati: II  TM Distance: >3 FB     Dental  (+) Upper Dentures, Lower Dentures   Pulmonary shortness of breath, COPD,           Cardiovascular hypertension, Pt. on medications + CAD, + Past MI and + Peripheral Vascular Disease  + dysrhythmias Atrial Fibrillation      Neuro/Psych PSYCHIATRIC DISORDERS Anxiety Depression    GI/Hepatic GERD  Controlled,  Endo/Other    Renal/GU      Musculoskeletal  (+) Arthritis , Rheumatoid disorders,    Abdominal   Peds  Hematology  (+) anemia ,   Anesthesia Other Findings Hb 11.4. Echo 50. AF. Poor R wave prog. Allergic to some NSAIDs, but can take toradol.  Reproductive/Obstetrics                           Anesthesia Physical Anesthesia Plan  ASA: III  Anesthesia Plan: General   Post-op Pain Management:    Induction: Intravenous  PONV Risk Score and Plan:   Airway Management Planned: LMA  Additional Equipment:   Intra-op Plan:   Post-operative Plan:   Informed Consent: I have reviewed the patients History and Physical, chart, labs and discussed the procedure including the risks, benefits and alternatives for the proposed anesthesia with the patient or authorized representative who has indicated his/her understanding and acceptance.     Plan Discussed with: CRNA  Anesthesia Plan Comments:         Anesthesia Quick Evaluation

## 2018-01-25 NOTE — Transfer of Care (Signed)
Immediate Anesthesia Transfer of Care Note  Patient: Melissa [name]  Procedure(s) Performed: DEBRIDEMENT WOUND (Left ) APPLICATION OF WOUND VAC (Left )  Patient Location: PACU  Anesthesia Type:General  Level of Consciousness: awake  Airway & Oxygen Therapy: Patient Spontanous Breathing and Patient connected to nasal cannula oxygen  Post-op Assessment: Report given to RN and Post -op Vital signs reviewed and stable  Post vital signs: Reviewed and stable  Last Vitals:  Vitals Value Taken Time  BP    Temp    Pulse 86 01/25/2018  6:57 PM  Resp 19 01/25/2018  6:57 PM  SpO2 100 % 01/25/2018  6:57 PM  Vitals shown include unvalidated device data.  Last Pain:  Vitals:   01/25/18 1341  TempSrc: Oral  PainSc: 1          Complications: No apparent anesthesia complications

## 2018-01-25 NOTE — Op Note (Signed)
    OPERATIVE NOTE   PROCEDURE: Excisional debridement left below-knee amputation stump  PRE-OPERATIVE DIAGNOSIS: Necrosis distal left below-knee amputation stump  POST-OPERATIVE DIAGNOSIS: Same  SURGEON: [name]  ASSISTANT(S): None  ANESTHESIA: general  ESTIMATED BLOOD LOSS: 10 cc  FINDING(S): Necrotic skin subcutaneous tissues and fascia  SPECIMEN(S): Debrided necrotic tissue was not sent to pathology  INDICATIONS:   Melissa [name] is a 77 y.o. female who presents with necrosis of the distal end of her left below-knee dictation stump.  Unfortunately, she fell sustaining blunt force trauma to her stump.  Subsequently, she has a dry gangrenous necrosis of the skin and soft tissues.  I recommended debridement with placement of VAC to expedite wound healing.  The risks and benefits were reviewed all questions were answered patient agrees to proceed.  DESCRIPTION: After full informed written consent was obtained from the patient, the patient was brought back to the operating room and placed supine upon the operating table.  Prior to induction, the patient received IV antibiotics.   After obtaining adequate anesthesia, the patient was then prepped and draped in the standard fashion for a debridement of the left below-knee amputation stump.  Quarter percent Marcaine with epinephrine was soft tissues.  Using a 15 blade scalpel the necrotic skin and soft tissues were debrided.  In the center necrotic fascia was also encountered along with some nonviable muscle fibers.  This area was debrided with Mayo scissors.  Once all nonviable tissue was excised the wound was irrigated with several 100 cc of saline and then hemostasis was obtained with Bovie cautery.  The wound was measured it is 11 cm x 8 cm oval in shape and approximately 5 mm depth.  Wound VAC was then applied with an excellent seal.   The patient tolerated this procedure well.   COMPLICATIONS: None  CONDITION:  [facility]  Office: [phone]   01/25/2018, 7:18 PM

## 2018-01-25 NOTE — H&P (Signed)
[facility] History & Physical Update  The patient was interviewed and re-examined.  The patient's previous History and Physical has been reviewed and is unchanged.  There is no change in the plan of care. We plan to proceed with the scheduled procedure.  [name], MD  01/25/2018, 5:52 PM

## 2018-01-25 NOTE — Anesthesia Procedure Notes (Signed)
Procedure Name: LMA Insertion Date/Time: 01/25/2018 6:15 PM Performed by: Chanetta Marshall, CRNA Pre-anesthesia Checklist: Patient identified, Emergency Drugs available and Suction available Patient Re-evaluated:Patient Re-evaluated prior to induction Oxygen Delivery Method: Circle system utilized Preoxygenation: Pre-oxygenation with 100% oxygen Induction Type: IV induction Ventilation: Mask ventilation without difficulty LMA: LMA inserted LMA Size: 3.0 Number of attempts: 1 Dental Injury: Teeth and Oropharynx as per pre-operative assessment

## 2018-01-25 NOTE — Discharge Instructions (Signed)

## 2018-01-26 ENCOUNTER — Emergency Department
Admission: EM | Admit: 2018-01-26 | Discharge: 2018-01-26 | Disposition: A | Discharge status: Home / Self Care | Payer: Medicare HMO | Attending: Emergency Medicine | Admitting: Emergency Medicine

## 2018-01-26 ENCOUNTER — Encounter: Payer: Self-pay | Admitting: Emergency Medicine

## 2018-01-26 ENCOUNTER — Other Ambulatory Visit: Payer: Self-pay

## 2018-01-26 DIAGNOSIS — Z79899 Other long term (current) drug therapy: Secondary | ICD-10-CM | POA: Insufficient documentation

## 2018-01-26 DIAGNOSIS — I10 Essential (primary) hypertension: Secondary | ICD-10-CM | POA: Insufficient documentation

## 2018-01-26 DIAGNOSIS — J449 Chronic obstructive pulmonary disease, unspecified: Secondary | ICD-10-CM | POA: Diagnosis not present

## 2018-01-26 DIAGNOSIS — Z7982 Long term (current) use of aspirin: Secondary | ICD-10-CM | POA: Diagnosis not present

## 2018-01-26 DIAGNOSIS — Z4801 Encounter for change or removal of surgical wound dressing: Secondary | ICD-10-CM | POA: Insufficient documentation

## 2018-01-26 DIAGNOSIS — Z7901 Long term (current) use of anticoagulants: Secondary | ICD-10-CM | POA: Diagnosis not present

## 2018-01-26 DIAGNOSIS — Z5189 Encounter for other specified aftercare: Secondary | ICD-10-CM

## 2018-01-26 DIAGNOSIS — T8189XA Other complications of procedures, not elsewhere classified, initial encounter: Secondary | ICD-10-CM | POA: Diagnosis not present

## 2018-01-26 MED ORDER — TRAMADOL HCL 50 MG PO TABS
50.0000 mg | ORAL_TABLET | Freq: Once | ORAL | Status: AC
  Administered 2018-01-26: 50 mg via ORAL
  Filled 2018-01-26: qty 1

## 2018-01-26 MED ORDER — LORAZEPAM 1 MG PO TABS
1.0000 mg | ORAL_TABLET | Freq: Once | ORAL | Status: AC
  Administered 2018-01-26: 1 mg via ORAL
  Filled 2018-01-26: qty 1

## 2018-01-26 MED ORDER — MORPHINE SULFATE (PF) 4 MG/ML IV SOLN
4.0000 mg | Freq: Once | INTRAVENOUS | Status: AC
  Administered 2018-01-26: 4 mg via INTRAMUSCULAR
  Filled 2018-01-26: qty 1

## 2018-01-26 NOTE — ED Notes (Signed)
Wound vac replaced by Dr. Kerman Passey.  Patient c/o pain to left leg.  Morphine given as ordered. See MAE

## 2018-01-26 NOTE — ED Provider Notes (Signed)
[facility] Emergency Department Provider Note  Time seen: 7:22 AM  I have reviewed the triage vital signs and the nursing notes.   HISTORY  Chief Complaint Wound Check    HPI Melissa [name] is a 77 y.o. female with a past medical history of COPD, depression, gastric reflux, hypertension, hyperlipidemia, presents to the emergency department with left lower extremity wound VAC leak.  According to the patient approximately for 5 weeks ago she had a left lower extremity amputation.  States it had been doing well but formed a large blister over the stump.  This popped approximately 2 or 3 days ago.  The nurse placed a new wound VAC on the wound.  Daughter states the wound VAC was alarming yesterday saying there was a leak, they attempted to tape over the leaking area which seemed to stop the alarm yesterday.  However this morning she awoke with a significant amount of bloody drainage in the bed, with a leaking wound VAC so they came to the emergency department for evaluation.  Patient denies any other symptoms.  Denies any increased pain.  No fever.  Largely negative review of systems.   Past Medical History:  Diagnosis Date  . Anemia 12/2017   blood transfusion after amputation  . Anxiety   . Arthritis    rheumatoid  . Ataxia   . Chronic ulcer of right great toe ([place]) 2019   slowly healing  . COPD (chronic obstructive pulmonary disease) ([place])   . Coronary artery disease 2018   complete blockage in one vessel, 40 % in another, 25% in 3rd vessell  . Depression   . Diverticulosis   . Dysrhythmia    atrial fib  . Excessive falling   . GERD (gastroesophageal reflux disease)    ulcerative colitis  . Hypercholesteremia   . Hyperlipidemia   . Hypertension   . IBS (irritable bowel syndrome)   . MRSA (methicillin resistant staph aureus) culture positive 11/2017   patient unaware of this diagnosis, was never informed or treated  . Myocardial infarction ([place]) 08/2017    had angio but no stents  . Osteoarthritis   . Psoriasis   . Raynaud's disease without gangrene   . Raynaud's disease without gangrene   . Shortness of breath dyspnea    with exertion  . Transfusion history 12/2017   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)    Patient Active Problem List   Diagnosis Date Noted  . Pressure ulcer of BKA stump, unstageable ([place]) 01/14/2018  . 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 Atherosclerosis of native arteries of the extremities with ulceration ([place]) 11/18/2017  . Essential hypertension 11/18/2017  . Pure hypercholesterolemia 11/18/2017  . GERD (gastroesophageal reflux disease) 11/18/2017  . Colitis 04/19/2017  . UTI due to extended-spectrum beta lactamase (ESBL) producing Escherichia coli 04/19/2017  . Other secondary osteoarthritis of one knee 04/04/2016    Past Surgical History:  Procedure Laterality Date  . ABDOMINAL HYSTERECTOMY  1980  . AMPUTATION Left 12/12/2017   Procedure: AMPUTATION BELOW KNEE;  Surgeon: Katha Cabal, MD;  Location: [facility] ORS;  Service: Vascular;  Laterality: Left;  . APPENDECTOMY  1956  . BACK SURGERY  2013   Spinal fusion with rods and screws  . CHOLECYSTECTOMY    . COLONOSCOPY WITH PROPOFOL N/A 01/29/2017   Procedure: COLONOSCOPY WITH PROPOFOL;  Surgeon: Manya Silvas, MD;  Location: [facility] ENDOSCOPY;  Service: Endoscopy;  Laterality: N/A;  . ESOPHAGOGASTRODUODENOSCOPY (EGD) WITH PROPOFOL N/A 01/29/2017   Procedure: ESOPHAGOGASTRODUODENOSCOPY (  EGD) WITH PROPOFOL;  Surgeon: Manya Silvas, MD;  Location: [facility] ENDOSCOPY;  Service: Endoscopy;  Laterality: N/A;  . EYE SURGERY Bilateral 2012   Cataract Extraction with IOL  . KNEE ARTHROSCOPY Left 2009   partial lateral meniscectomy, debridement, excision of Baker's cyst  . LEFT HEART CATH AND CORONARY ANGIOGRAPHY N/A 04/23/2017   Procedure: LEFT HEART CATH AND CORONARY ANGIOGRAPHY;  Surgeon: Teodoro Spray, MD;  Location: [place] CV LAB;  Service: Cardiovascular;   Laterality: N/A;  . LEFT HEART CATH AND CORONARY ANGIOGRAPHY Left 09/07/2017   Procedure: LEFT HEART CATH AND CORONARY ANGIOGRAPHY;  Surgeon: Teodoro Spray, MD;  Location: [place] [facility];  Service: Cardiovascular;  Laterality: Left;  . LOWER EXTREMITY ANGIOGRAPHY Left 11/27/2017   Procedure: LOWER EXTREMITY ANGIOGRAPHY;  Surgeon: Katha Cabal, MD;  Location: [place] [facility];  Service: Cardiovascular;  Laterality: Left;  . LOWER EXTREMITY ANGIOGRAPHY Left 12/05/2017   Procedure: LOWER EXTREMITY ANGIOGRAPHY;  Surgeon: Katha Cabal, MD;  Location: [place] [facility];  Service: Cardiovascular;  Laterality: Left;  . ROTATOR CUFF REPAIR Right 1990  . TOTAL KNEE ARTHROPLASTY Left 04/04/2016   Procedure: TOTAL KNEE ARTHROPLASTY;  Surgeon: Hessie Knows, MD;  Location: [facility] ORS;  Service: Orthopedics;  Laterality: Left;    Prior to Admission medications   Medication Sig Start Date End Date Taking? Authorizing Provider  acetaminophen (TYLENOL) 500 MG tablet Take 1,000 mg by mouth every 8 (eight) hours as needed for mild pain or headache.    [provider]  apixaban (ELIQUIS) 5 MG TABS tablet Take 1 tablet (5 mg total) by mouth 2 (two) times daily. 04/24/17   Hillary Bow, MD  aspirin EC 81 MG tablet Take 1 tablet (81 mg total) by mouth daily. 04/24/17   Hillary Bow, MD  budesonide (ENTOCORT EC) 3 MG 24 hr capsule Take 3 capsules (9 mg total) by mouth daily. Taper down to 3 mg 12/19/17   Schnier, Dolores Lory, MD  Calcium Carbonate-Vitamin D (CALCIUM 600+D) 600-400 MG-UNIT tablet Take 1 tablet by mouth 2 (two) times daily.     [provider]  collagenase (SANTYL) ointment Apply 1 application topically daily. 01/03/18   Stegmayer, Joelene Millin A, PA-C  Cranberry 500 MG CAPS Take 500 mg by mouth daily.    [provider]  diltiazem (CARDIZEM CD) 240 MG 24 hr capsule Take 240 mg by mouth daily.    [provider]  estradiol (ESTRACE) 0.1 MG/GM vaginal  cream Place 1 g vaginally 3 (three) times a week. 06/20/17   Hollice Espy, MD  ferrous sulfate 325 (65 FE) MG tablet Take 325 mg by mouth daily with breakfast.    [provider]  gabapentin (NEURONTIN) 300 MG capsule Take 1 capsule (300 mg total) by mouth daily. 11/30/17   Stegmayer, Joelene Millin A, PA-C  gemfibrozil (LOPID) 600 MG tablet Take 600 mg by mouth 2 (two) times daily before a meal.    [provider]  hydroxychloroquine (PLAQUENIL) 200 MG tablet Take 200 mg by mouth 2 (two) times daily.     [provider]  leflunomide (ARAVA) 20 MG tablet Take 20 mg by mouth daily.    [provider]  lisinopril (PRINIVIL,ZESTRIL) 10 MG tablet Take 10 mg by mouth daily.    [provider]  LORazepam (ATIVAN) 1 MG tablet Take 1 tablet (1 mg total) by mouth 2 (two) times daily as needed for anxiety. 04/24/17   Hillary Bow, MD  Melatonin 3  MG TABS Take by mouth at bedtime.    [provider]  Multiple Vitamin (MULTIVITAMIN) tablet Take 1 tablet by mouth daily.    [provider]  Multiple Vitamins-Minerals (PRESERVISION AREDS) CAPS Take 1 capsule by mouth 2 (two) times daily.    [provider]  omeprazole (PRILOSEC) 20 MG capsule Take 20 mg by mouth 2 (two) times daily before a meal.     [provider]  oxyCODONE-acetaminophen (PERCOCET) 5-325 MG tablet Take 1 tablet by mouth every 6 (six) hours as needed for moderate pain or severe pain. 01/03/18   Stegmayer, Janalyn Harder, PA-C  oxyCODONE-acetaminophen (PERCOCET/ROXICET) 5-325 MG tablet Take 1 tablet by mouth every 4 (four) hours as needed for severe pain. 01/03/18   Stegmayer, Joelene Millin A, PA-C  pravastatin (PRAVACHOL) 80 MG tablet Take 80 mg by mouth at bedtime.     [provider]  predniSONE (DELTASONE) 5 MG tablet Take 5 mg by mouth daily with breakfast.     [provider]  sertraline (ZOLOFT) 50 MG tablet Take 50 mg by mouth at bedtime.     [provider]  tiZANidine (ZANAFLEX) 4 MG tablet Take 4 mg by mouth daily as needed for muscle spasms (neck pain).  06/20/17   [provider]  traMADol (ULTRAM) 50 MG tablet Take 1 tablet (50 mg total) by mouth every 6 (six) hours as needed for moderate pain or severe pain. 01/03/18   Stegmayer, Janalyn Harder, PA-C  traMADol (ULTRAM) 50 MG tablet Take 1 tablet (50 mg total) by mouth every 6 (six) hours as needed. 01/25/18   Schnier, Dolores Lory, MD    Allergies  Allergen Reactions  . Lodine [Etodolac] Hives and Itching  . Methotrexate Derivatives Other (See Comments)    Flu like symptoms  . Nitrofuran Derivatives Nausea And Vomiting and Other (See Comments)    Made sicker   . Levaquin [Levofloxacin] Itching, Rash and Other (See Comments)    Muscle pain  . Norvasc [Amlodipine] Other (See Comments)    Unknown   . Pentasa [Mesalamine Er] Other (See Comments)    Unknown   . Sulfa Antibiotics Itching and Rash  . Vioxx [Rofecoxib] Other (See Comments)    Unknown     Family History  Problem Relation Age of Onset  . CAD Mother   . CAD Father   . Breast cancer Neg Hx     Social History Social History   Tobacco Use  . Smoking status: Never Smoker  . Smokeless tobacco: Never Used  Substance Use Topics  . Alcohol use: No  . Drug use: No    Review of Systems Constitutional: Negative for fever Cardiovascular: Negative for chest pain. Respiratory: Negative for shortness of breath. Gastrointestinal: Negative for abdominal pain Musculoskeletal: Left lower extremity amputation wound VAC leak Skin: Blistered left lower extremity which has since popped, continues to have bloody drainage for the past 2 days. All other ROS negative  ____________________________________________   PHYSICAL EXAM:  VITAL SIGNS: ED Triage Vitals  Enc Vitals Group     BP 01/26/18 0655 (!) 152/90     Pulse Rate 01/26/18 0655 92     Resp 01/26/18 0655 16     Temp 01/26/18 0655 98.2 F (36.8 C)      Temp Source 01/26/18 0655 Oral     SpO2 01/26/18 0655 97 %     Weight 01/26/18 0654 108 lb (49 kg)     Height 01/26/18 0654 5' 4"  (1.626 m)  Head Circumference --      Peak Flow --      Pain Score 01/26/18 0654 2     Pain Loc --      Pain Edu? --      Excl. in [place]? --    Constitutional: Alert and oriented. Well appearing and in no distress. Eyes: Normal exam ENT   Head: Normocephalic and atraumatic.   Mouth/Throat: Mucous membranes are moist. Cardiovascular: Normal rate, regular rhythm. Respiratory: Normal respiratory effort without tachypnea nor retractions. Breath sounds are clear  Gastrointestinal: Soft and nontender. No distention.   Musculoskeletal: Status post left BKA, stump does appear somewhat bloody with wound VAC in place draining serosanguineous fluid.  Wound VAC is leaking. Neurologic:  Normal speech and language. No gross focal neurologic deficits Skin:  Skin is warm.  Wound VAC to left lower extremity amputation stump, currently leaking. Psychiatric: Mood and affect are normal.  ____________________________________________   INITIAL IMPRESSION / ASSESSMENT AND PLAN / ED COURSE  Pertinent labs & imaging results that were available during my care of the patient were reviewed by me and considered in my medical decision making (see chart for details).  Patient presents to the emergency department with a leaking left lower extremity wound VAC.  Patient has no other complaints at this time. I reviewed the patient's records, she had debridement performed yesterday by Dr. Delana Meyer, with wound VAC placed.  Given the moderate amount of bleeding currently with recent debridement yesterday we will discuss with vascular surgery for further recommendations as further cautery may be required in the new wound VAC will likely need to be placed.  Discussed the patient with Dr. Lucky Cowboy of vascular surgery.  He recommends removing the wound VAC to assess hemostasis.  We could use  silver nitrate or Surgicel if needed to stop bleeding.  After removing the wound VAC, patient had mild oozing of blood from the lower portion of the wound.  I placed a small single layer of Surgicel over the area and achieved hemostasis after several minutes.  I replaced the wound VAC, with new sponge, dressing and tubing.  Excellent seal is achieved.  No bleeding at this time.  Patient did have moderate discomfort during wound VAC change, we will dose pain medication for the patient.  Wound VAC continues to have an excellent seal with very minimal serosanguineous drainage.  We will discharge the patient home with vascular surgery follow-up.  ____________________________________________   FINAL CLINICAL IMPRESSION(S) / ED DIAGNOSES  Postoperative bleeding Wound VAC evaluation    Harvest Dark, MD 01/26/18 [phone]

## 2018-01-26 NOTE — ED Notes (Signed)
AAOx3.  Skin warm and dry.  NAD 

## 2018-01-26 NOTE — ED Triage Notes (Signed)
Pt arrives POV to triage with c/o wound vac malfunction. Pt is in NAD.

## 2018-01-26 NOTE — Discharge Instructions (Addendum)
Please follow-up with Dr. Delana Meyer by calling the number provided to arrange a follow-up appointment.  Return to the emergency department for any increased pain or bleeding, or any other symptom personally concerning to yourself

## 2018-01-26 NOTE — ED Notes (Signed)
Pt to ED with daughter, c/o bleeding at left BTK amputation that was done by Dr Hurley Cisco approx 4-5 weeks ago, pt came to wound center for debridement yesterday and was discharged with wound vac in place, daughter reports taping a leak yesterday evening and then this morning (approx 4am) pt went to toilet and noticed blood pouring from wound and wound vac malfunctioning.  Pt takes Eliquis for Afib and had just restarted taking the drug last night  Stump appears bloody and pump reads "container full" thought it's not, pump reset by this RN and turned to continuous at 150 mmHg, wound vac running att  Left BTK amputation d/t peripheral vascular disease, pt is A&O x 4, NAD

## 2018-01-26 NOTE — Anesthesia Postprocedure Evaluation (Signed)
Anesthesia Post Note  Patient: Melissa [name]  Procedure(s) Performed: DEBRIDEMENT WOUND (Left ) APPLICATION OF WOUND VAC (Left )  Patient location during evaluation: PACU Anesthesia Type: General Level of consciousness: awake and alert Pain management: pain level controlled Vital Signs Assessment: post-procedure vital signs reviewed and stable Respiratory status: spontaneous breathing, nonlabored ventilation, respiratory function stable and patient connected to nasal cannula oxygen Cardiovascular status: blood pressure returned to baseline and stable Postop Assessment: no apparent nausea or vomiting Anesthetic complications: no     Last Vitals:  Vitals:   01/25/18 1927 01/25/18 1942  BP: 131/70 136/83  Pulse: 87   Resp: 13 16  Temp: 36.9 C   SpO2: 94% 95%    Last Pain:  Vitals:   01/25/18 1942  TempSrc:   PainSc: 0-No pain                 Martha Clan

## 2018-01-27 ENCOUNTER — Encounter: Payer: Self-pay | Admitting: Vascular Surgery

## 2018-01-27 DIAGNOSIS — M6281 Muscle weakness (generalized): Secondary | ICD-10-CM | POA: Diagnosis not present

## 2018-01-27 DIAGNOSIS — G8929 Other chronic pain: Secondary | ICD-10-CM | POA: Diagnosis not present

## 2018-01-27 DIAGNOSIS — Z89512 Acquired absence of left leg below knee: Secondary | ICD-10-CM | POA: Diagnosis not present

## 2018-01-28 ENCOUNTER — Other Ambulatory Visit: Payer: Self-pay | Admitting: *Deleted

## 2018-01-28 NOTE — Patient Outreach (Signed)
Melissa [name]) Care Management  01/28/2018  Melissa [name] Dec 12, 1940 [id number]  Referral from [facility]; member discharged from [facility] 01/03/2018:  Per hx: Patient had recent BKA-left; hospital admission 5/22-12/18/2017 Skilled facility from 6/4-6/20 Had outpatient -excisional debridement with woundVAC applied 7/12 ER visit for 7/13 for Wound Vac leakage/area was cauterized and VAC reapplied.  Spoke with patient who voices that she is home now with [facility] services RN, PT, OT twice weekly.   States she has appointment with primary care provider next week which was the earliest appointment that they had available. States has appointment with vascular surgeon 7/21. States family member will take her to appointments. States she has strong support from her family. States her medications are being managed and she is taking all of her medications as ordered by her MD> .States she uses mail order for long term medications and local pharmacy for short term medications. States family is providing meals and that she received 10 meals from her insurance benefits. States she plans to get prosthesis and already has vendor . States that will occur when everything is healed which will be much later.   Patient voices that she does not need case management services at this time. Voices she does not have any health concerns at this time. States she has very strong family support at this time and is declining [facility] services.   Plan: Case closure. Send MD closure letter.  Melissa Daisy, RN BSN [place] Management Coordinator [facility] Care Management  [phone]

## 2018-01-29 ENCOUNTER — Telehealth (INDEPENDENT_AMBULATORY_CARE_PROVIDER_SITE_OTHER): Payer: Self-pay

## 2018-01-29 ENCOUNTER — Encounter: Payer: Self-pay | Admitting: *Deleted

## 2018-01-29 DIAGNOSIS — T8789 Other complications of amputation stump: Secondary | ICD-10-CM | POA: Diagnosis not present

## 2018-01-29 NOTE — Telephone Encounter (Signed)
Patient called stating her " pump was not working correctly " and she was unable to contact anyone. I  Called Ronnell Guadalajara from [facility] and let him know the patient is having issues with the wound vac, he stated someone would be calling the patient. Spoke with the patient and let her know that someone from [facility] will be calling her.

## 2018-01-30 ENCOUNTER — Telehealth (INDEPENDENT_AMBULATORY_CARE_PROVIDER_SITE_OTHER): Payer: Self-pay

## 2018-01-30 DIAGNOSIS — M069 Rheumatoid arthritis, unspecified: Secondary | ICD-10-CM | POA: Diagnosis not present

## 2018-01-30 DIAGNOSIS — Z4781 Encounter for orthopedic aftercare following surgical amputation: Secondary | ICD-10-CM | POA: Diagnosis not present

## 2018-01-30 DIAGNOSIS — E785 Hyperlipidemia, unspecified: Secondary | ICD-10-CM | POA: Diagnosis not present

## 2018-01-30 DIAGNOSIS — D649 Anemia, unspecified: Secondary | ICD-10-CM | POA: Diagnosis not present

## 2018-01-30 DIAGNOSIS — I7300 Raynaud's syndrome without gangrene: Secondary | ICD-10-CM | POA: Diagnosis not present

## 2018-01-30 DIAGNOSIS — J449 Chronic obstructive pulmonary disease, unspecified: Secondary | ICD-10-CM | POA: Diagnosis not present

## 2018-01-30 DIAGNOSIS — I10 Essential (primary) hypertension: Secondary | ICD-10-CM | POA: Diagnosis not present

## 2018-01-30 DIAGNOSIS — Z89512 Acquired absence of left leg below knee: Secondary | ICD-10-CM | POA: Diagnosis not present

## 2018-01-30 DIAGNOSIS — I7090 Unspecified atherosclerosis: Secondary | ICD-10-CM | POA: Diagnosis not present

## 2018-01-30 NOTE — Telephone Encounter (Signed)
Nurse from [facility] called and asked for the Wound Vac orders for this patient.  I gave her a verbal order, per Maudie Mercury, the patient is to have the Wound Vac changed every other day and it should be set to -125, continuous and the nurse should call back if there are any adverse changes.

## 2018-01-30 NOTE — Telephone Encounter (Signed)
Done in error.

## 2018-01-30 NOTE — Telephone Encounter (Signed)
Bern, the nurse with [facility], called and stated that she needs orders for the patient's Right leg amputation of either the wound Vac, or for dressing the leg?

## 2018-02-01 DIAGNOSIS — M069 Rheumatoid arthritis, unspecified: Secondary | ICD-10-CM | POA: Diagnosis not present

## 2018-02-01 DIAGNOSIS — E785 Hyperlipidemia, unspecified: Secondary | ICD-10-CM | POA: Diagnosis not present

## 2018-02-01 DIAGNOSIS — I7090 Unspecified atherosclerosis: Secondary | ICD-10-CM | POA: Diagnosis not present

## 2018-02-01 DIAGNOSIS — I7300 Raynaud's syndrome without gangrene: Secondary | ICD-10-CM | POA: Diagnosis not present

## 2018-02-01 DIAGNOSIS — I10 Essential (primary) hypertension: Secondary | ICD-10-CM | POA: Diagnosis not present

## 2018-02-01 DIAGNOSIS — D649 Anemia, unspecified: Secondary | ICD-10-CM | POA: Diagnosis not present

## 2018-02-01 DIAGNOSIS — Z4781 Encounter for orthopedic aftercare following surgical amputation: Secondary | ICD-10-CM | POA: Diagnosis not present

## 2018-02-01 DIAGNOSIS — J449 Chronic obstructive pulmonary disease, unspecified: Secondary | ICD-10-CM | POA: Diagnosis not present

## 2018-02-01 DIAGNOSIS — Z89512 Acquired absence of left leg below knee: Secondary | ICD-10-CM | POA: Diagnosis not present

## 2018-02-04 ENCOUNTER — Ambulatory Visit (INDEPENDENT_AMBULATORY_CARE_PROVIDER_SITE_OTHER): Payer: Medicare HMO | Admitting: Vascular Surgery

## 2018-02-04 ENCOUNTER — Other Ambulatory Visit (INDEPENDENT_AMBULATORY_CARE_PROVIDER_SITE_OTHER): Payer: Self-pay | Admitting: Vascular Surgery

## 2018-02-04 ENCOUNTER — Encounter (INDEPENDENT_AMBULATORY_CARE_PROVIDER_SITE_OTHER): Payer: Self-pay

## 2018-02-04 ENCOUNTER — Encounter (INDEPENDENT_AMBULATORY_CARE_PROVIDER_SITE_OTHER): Payer: Self-pay | Admitting: Vascular Surgery

## 2018-02-04 VITALS — BP 128/66 | HR 85 | Resp 16

## 2018-02-04 DIAGNOSIS — T879 Unspecified complications of amputation stump: Secondary | ICD-10-CM | POA: Insufficient documentation

## 2018-02-04 DIAGNOSIS — E7800 Pure hypercholesterolemia, unspecified: Secondary | ICD-10-CM

## 2018-02-04 DIAGNOSIS — I7025 Atherosclerosis of native arteries of other extremities with ulceration: Secondary | ICD-10-CM

## 2018-02-04 DIAGNOSIS — I10 Essential (primary) hypertension: Secondary | ICD-10-CM

## 2018-02-04 NOTE — Progress Notes (Signed)
Patient ID: Melissa [name], female   DOB: Feb 19, 1941, 77 y.o.   MRN: [id number]  Chief Complaint  Patient presents with  . Follow-up    1 week [facility]    HPI Melissa [name] is a 77 y.o. female.    Patient returns the office status post excisional debridement of a necrotic area of skin and soft tissue left below-knee amputation stump.  She is not complaining of any pain.  She did note that she return to the ER post debridement for bleeding which apparently was a small medial vein.  PROCEDURE 01/25/2018: Excisional debridement left below-knee amputation stump    Past Medical History:  Diagnosis Date  . Anemia 12/2017   blood transfusion after amputation  . Anxiety   . Arthritis    rheumatoid  . Ataxia   . Chronic ulcer of right great toe ([place]) 2019   slowly healing  . COPD (chronic obstructive pulmonary disease) ([place])   . Coronary artery disease 2018   complete blockage in one vessel, 40 % in another, 25% in 3rd vessell  . Depression   . Diverticulosis   . Dysrhythmia    atrial fib  . Excessive falling   . GERD (gastroesophageal reflux disease)    ulcerative colitis  . Hypercholesteremia   . Hyperlipidemia   . Hypertension   . IBS (irritable bowel syndrome)   . MRSA (methicillin resistant staph aureus) culture positive 11/2017   patient unaware of this diagnosis, was never informed or treated  . Myocardial infarction ([place]) 08/2017   had angio but no stents  . Osteoarthritis   . Psoriasis   . Raynaud's disease without gangrene   . Raynaud's disease without gangrene   . Shortness of breath dyspnea    with exertion  . 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 ABDOMINAL HYSTERECTOMY  1980  . AMPUTATION Left 12/12/2017   Procedure: AMPUTATION BELOW KNEE;  Surgeon: Katha Cabal, MD;  Location: [facility] ORS;  Service: Vascular;  Laterality: Left;  . APPENDECTOMY  1956  . APPLICATION OF  WOUND VAC Left 01/25/2018   Procedure: APPLICATION OF WOUND VAC;  Surgeon: Katha Cabal, MD;  Location: [facility] ORS;  Service: Vascular;  Laterality: Left;  . BACK SURGERY  2013   Spinal fusion with rods and screws  . CHOLECYSTECTOMY    . COLONOSCOPY WITH PROPOFOL N/A 01/29/2017   Procedure: COLONOSCOPY WITH PROPOFOL;  Surgeon: Manya Silvas, MD;  Location: [facility] ENDOSCOPY;  Service: Endoscopy;  Laterality: N/A;  . ESOPHAGOGASTRODUODENOSCOPY (EGD) WITH PROPOFOL N/A 01/29/2017   Procedure: ESOPHAGOGASTRODUODENOSCOPY (EGD) WITH PROPOFOL;  Surgeon: Manya Silvas, MD;  Location: [facility] ENDOSCOPY;  Service: Endoscopy;  Laterality: N/A;  . EYE SURGERY Bilateral 2012   Cataract Extraction with IOL  . KNEE ARTHROSCOPY Left 2009   partial lateral meniscectomy, debridement, excision of Baker's cyst  . LEFT HEART CATH AND CORONARY ANGIOGRAPHY N/A 04/23/2017   Procedure: LEFT HEART CATH AND CORONARY ANGIOGRAPHY;  Surgeon: Teodoro Spray, MD;  Location: [place] CV LAB;  Service: Cardiovascular;  Laterality: N/A;  . LEFT HEART CATH AND CORONARY ANGIOGRAPHY Left 09/07/2017   Procedure: LEFT HEART CATH AND CORONARY ANGIOGRAPHY;  Surgeon: Teodoro Spray, MD;  Location: [place] CV LAB;  Service: Cardiovascular;  Laterality: Left;  . LOWER EXTREMITY ANGIOGRAPHY Left 11/27/2017   Procedure: LOWER EXTREMITY ANGIOGRAPHY;  Surgeon: Katha Cabal, MD;  Location: [facility]  INVASIVE CV LAB;  Service: Cardiovascular;  Laterality: Left;  . LOWER EXTREMITY ANGIOGRAPHY Left 12/05/2017   Procedure: LOWER EXTREMITY ANGIOGRAPHY;  Surgeon: Katha Cabal, MD;  Location: [place] CV LAB;  Service: Cardiovascular;  Laterality: Left;  . ROTATOR CUFF REPAIR Right 1990  . TOTAL KNEE ARTHROPLASTY Left 04/04/2016   Procedure: TOTAL KNEE ARTHROPLASTY;  Surgeon: Hessie Knows, MD;  Location: [facility] ORS;  Service: Orthopedics;  Laterality: Left;  . WOUND DEBRIDEMENT Left 01/25/2018   Procedure: DEBRIDEMENT WOUND;   Surgeon: Katha Cabal, MD;  Location: [facility] ORS;  Service: Vascular;  Laterality: Left;      Allergies  Allergen Reactions  . Lodine [Etodolac] Hives and Itching  . Methotrexate Derivatives Other (See Comments)    Flu like symptoms  . Nitrofuran Derivatives Nausea And Vomiting and Other (See Comments)    Made sicker   . Levaquin [Levofloxacin] Itching, Rash and Other (See Comments)    Muscle pain  . Norvasc [Amlodipine] Other (See Comments)    Unknown   . Pentasa [Mesalamine Er] Other (See Comments)    Unknown   . Sulfa Antibiotics Itching and Rash  . Vioxx [Rofecoxib] Other (See Comments)    Unknown     Current Outpatient Medications  Medication Sig Dispense Refill  . acetaminophen (TYLENOL) 500 MG tablet Take 1,000 mg by mouth every 8 (eight) hours as needed for mild pain or headache.    Marland Kitchen apixaban (ELIQUIS) 5 MG TABS tablet Take 1 tablet (5 mg total) by mouth 2 (two) times daily. 60 tablet   . aspirin EC 81 MG tablet Take 1 tablet (81 mg total) by mouth daily.    . budesonide (ENTOCORT EC) 3 MG 24 hr capsule Take 3 capsules (9 mg total) by mouth daily. Taper down to 3 mg 30 capsule 0  . Calcium Carbonate-Vitamin D (CALCIUM 600+D) 600-400 MG-UNIT tablet Take 1 tablet by mouth 2 (two) times daily.     . Cranberry 500 MG CAPS Take 500 mg by mouth daily.    Marland Kitchen diltiazem (CARDIZEM CD) 240 MG 24 hr capsule Take 240 mg by mouth daily.    Marland Kitchen estradiol (ESTRACE) 0.1 MG/GM vaginal cream Place 1 g vaginally 3 (three) times a week. 42.5 g 12  . ferrous sulfate 325 (65 FE) MG tablet Take 325 mg by mouth daily with breakfast.    . gabapentin (NEURONTIN) 300 MG capsule Take 1 capsule (300 mg total) by mouth daily. 30 capsule 0  . gemfibrozil (LOPID) 600 MG tablet Take 600 mg by mouth 2 (two) times daily before a meal.    . hydroxychloroquine (PLAQUENIL) 200 MG tablet Take 200 mg by mouth 2 (two) times daily.     Marland Kitchen leflunomide (ARAVA) 20 MG tablet Take 20 mg by mouth daily.    Marland Kitchen  lisinopril (PRINIVIL,ZESTRIL) 10 MG tablet Take 10 mg by mouth daily.    Marland Kitchen LORazepam (ATIVAN) 1 MG tablet Take 1 tablet (1 mg total) by mouth 2 (two) times daily as needed for anxiety. 10 tablet 0  . Melatonin 3 MG TABS Take by mouth at bedtime.    . Multiple Vitamin (MULTIVITAMIN) tablet Take 1 tablet by mouth daily.    . Multiple Vitamins-Minerals (PRESERVISION AREDS) CAPS Take 1 capsule by mouth 2 (two) times daily.    Marland Kitchen omeprazole (PRILOSEC) 20 MG capsule Take 20 mg by mouth 2 (two) times daily before a meal.     . oxyCODONE-acetaminophen (PERCOCET) 5-325 MG tablet Take 1 tablet by mouth  every 6 (six) hours as needed for moderate pain or severe pain. 28 tablet 0  . oxyCODONE-acetaminophen (PERCOCET/ROXICET) 5-325 MG tablet Take 1 tablet by mouth every 4 (four) hours as needed for severe pain. 30 tablet 0  . pravastatin (PRAVACHOL) 80 MG tablet Take 80 mg by mouth at bedtime.     . predniSONE (DELTASONE) 5 MG tablet Take 5 mg by mouth daily with breakfast.     . sertraline (ZOLOFT) 50 MG tablet Take 50 mg by mouth at bedtime.     Marland Kitchen tiZANidine (ZANAFLEX) 4 MG tablet Take 4 mg by mouth daily as needed for muscle spasms (neck pain).     . traMADol (ULTRAM) 50 MG tablet Take 1 tablet (50 mg total) by mouth every 6 (six) hours as needed for moderate pain or severe pain. 60 tablet 5  . traMADol (ULTRAM) 50 MG tablet Take 1 tablet (50 mg total) by mouth every 6 (six) hours as needed. 20 tablet 0  . collagenase (SANTYL) ointment Apply 1 application topically daily. (Patient not taking: Reported on 01/28/2018) 30 g 5   No current facility-administered medications for this visit.         Physical Exam BP 128/66 (BP Location: Right Arm)   Pulse 85   Resp 16  Gen:  WD/WN, NAD Cardiac: Regular rate and rhythm Pulmonary: No audible wheezing, no use of accessory muscles Abdominal: Nondistended Skin: incision VAC is removed the tissues in general looks healthy and there is evidence of granulation  however the entire cut and of the tibia is now exposed    Assessment/Plan:  1. Complication of below knee amputation stump (HCC) Recommend: Patient should undergo revision of her below-knee amputation stump.  I have pulled up old knee x-rays to show the patient and her daughter the stem of the prosthesis.  In looking at the incision I am hopeful that there is still 4 to 5 cm below the stem that would allow for revision.  Risks and benefits were reviewed patient has agreed to proceed.  We will continue the [facility] for now.  2. Atherosclerosis of native arteries of the extremities with ulceration ([place]) See #1  3. Pure hypercholesterolemia Continue statin as ordered and reviewed, no changes at this time   4. Essential hypertension Continue antihypertensive medications as already ordered, these medications have been reviewed and there are no changes at this time.        [name] 02/04/2018, 9:25 AM   This note was created with Dragon medical transcription system.  Any errors from dictation are unintentional.

## 2018-02-05 ENCOUNTER — Other Ambulatory Visit: Payer: Self-pay

## 2018-02-05 ENCOUNTER — Encounter
Admission: RE | Admit: 2018-02-05 | Discharge: 2018-02-05 | Disposition: A | Discharge status: Home / Self Care | Payer: Medicare HMO | Source: Ambulatory Visit | Attending: Vascular Surgery | Admitting: Vascular Surgery

## 2018-02-05 NOTE — Patient Instructions (Signed)
Your procedure is scheduled on7/26/19 Report to Day Surgery. [facility]OND FLOOR To find out your arrival time please call [phone] between 1PM - 3PM on 02/07/18.  Remember: Instructions that are not followed completely may result in serious medical risk,  up to and including death, or upon the discretion of your surgeon and anesthesiologist your  surgery may need to be rescheduled.     _X__ 1. Do not eat food after midnight the night before your procedure.                 No gum chewing or hard candies. You may drink clear liquids up to 2 hours                 before you are scheduled to arrive for your surgery- DO not drink clear                 liquids within 2 hours of the start of your surgery.                 Clear Liquids include:  water, apple juice without pulp, clear carbohydrate                 drink such as Clearfast of Gatorade, Black Coffee or Tea (Do not add                 anything to coffee or tea).  __X__2.  On the morning of surgery brush your teeth with toothpaste and water, you                may rinse your mouth with mouthwash if you wish.  Do not swallow any toothpaste of mouthwash.     _X__ 3.  No Alcohol for 24 hours before or after surgery.   _X__ 4.  Do Not Smoke or use e-cigarettes For 24 Hours Prior to Your Surgery.                 Do not use any chewable tobacco products for at least 6 hours prior to                 surgery.  ____  5.  Bring all medications with you on the day of surgery if instructed.   __X__  6.  Notify your doctor if there is any change in your medical condition      (cold, fever, infections).     Do not wear jewelry, make-up, hairpins, clips or nail polish. Do not wear lotions, powders, or perfumes. You may wear deodorant. Do not shave 48 hours prior to surgery. Men may shave face and neck. Do not bring valuables to the hospital.    [facility] is not responsible for any belongings or  valuables.  Contacts, dentures or bridgework may not be worn into surgery. Leave your suitcase in the car. After surgery it may be brought to your room. For patients admitted to the hospital, discharge time is determined by your treatment team.   Patients discharged the day of surgery will not be allowed to drive home.    _X___ Take these medicines the morning of surgery with A SIP OF WATER: AS INSTRUCTED BY DR [facility] OFFICE   1.DILTIAZEM  2. PLAQUENIL  3.ATIVAN IF NEEDED  4.PAIN MEDICATION IF NEEDED  5.PREDNISONE  6.OMEPRAZOLE  ____ Fleet Enema (as directed)   ____ Use CHG Soap as directed  ____ Use inhalers on the day of surgery  ____ Stop  metformin 2 days prior to surgery    ____ Take 1/2 of usual insulin dose the night before surgery. No insulin the morning          of surgery.   _X___ Stop Coumadin/Plavix/aspirin on  LASRT DOSE OF ELIQUIS 02/04/18  ____ Stop Anti-inflammatories on    ____ Stop supplements until after surgery.    ____ Bring C-Pap to the hospital.

## 2018-02-06 DIAGNOSIS — J449 Chronic obstructive pulmonary disease, unspecified: Secondary | ICD-10-CM | POA: Diagnosis not present

## 2018-02-06 DIAGNOSIS — I7090 Unspecified atherosclerosis: Secondary | ICD-10-CM | POA: Diagnosis not present

## 2018-02-06 DIAGNOSIS — I7300 Raynaud's syndrome without gangrene: Secondary | ICD-10-CM | POA: Diagnosis not present

## 2018-02-06 DIAGNOSIS — Z89512 Acquired absence of left leg below knee: Secondary | ICD-10-CM | POA: Diagnosis not present

## 2018-02-06 DIAGNOSIS — Z4781 Encounter for orthopedic aftercare following surgical amputation: Secondary | ICD-10-CM | POA: Diagnosis not present

## 2018-02-06 DIAGNOSIS — D649 Anemia, unspecified: Secondary | ICD-10-CM | POA: Diagnosis not present

## 2018-02-06 DIAGNOSIS — M069 Rheumatoid arthritis, unspecified: Secondary | ICD-10-CM | POA: Diagnosis not present

## 2018-02-06 DIAGNOSIS — I10 Essential (primary) hypertension: Secondary | ICD-10-CM | POA: Diagnosis not present

## 2018-02-06 DIAGNOSIS — E785 Hyperlipidemia, unspecified: Secondary | ICD-10-CM | POA: Diagnosis not present

## 2018-02-07 MED ORDER — CEFAZOLIN SODIUM-DEXTROSE 2-4 GM/100ML-% IV SOLN
2.0000 g | INTRAVENOUS | Status: AC
  Administered 2018-02-08: 2 g via INTRAVENOUS

## 2018-02-08 ENCOUNTER — Other Ambulatory Visit: Payer: Self-pay

## 2018-02-08 ENCOUNTER — Inpatient Hospital Stay
Admission: RE | Admit: 2018-02-08 | Discharge: 2018-02-13 | DRG: 475 | Disposition: A | Discharge status: Skilled Nursing Facility | Payer: Medicare HMO | Source: Ambulatory Visit | Attending: Vascular Surgery | Admitting: Vascular Surgery

## 2018-02-08 ENCOUNTER — Encounter: Payer: Self-pay | Admitting: Anesthesiology

## 2018-02-08 ENCOUNTER — Inpatient Hospital Stay: Payer: Medicare HMO | Admitting: Anesthesiology

## 2018-02-08 ENCOUNTER — Encounter
Admission: RE | Disposition: A | Discharge status: Skilled Nursing Facility | Payer: Self-pay | Source: Ambulatory Visit | Attending: Vascular Surgery

## 2018-02-08 ENCOUNTER — Inpatient Hospital Stay: Payer: Medicare HMO

## 2018-02-08 DIAGNOSIS — I252 Old myocardial infarction: Secondary | ICD-10-CM

## 2018-02-08 DIAGNOSIS — I10 Essential (primary) hypertension: Secondary | ICD-10-CM | POA: Diagnosis present

## 2018-02-08 DIAGNOSIS — G629 Polyneuropathy, unspecified: Secondary | ICD-10-CM | POA: Diagnosis present

## 2018-02-08 DIAGNOSIS — E7849 Other hyperlipidemia: Secondary | ICD-10-CM | POA: Diagnosis not present

## 2018-02-08 DIAGNOSIS — Y835 Amputation of limb(s) as the cause of abnormal reaction of the patient, or of later complication, without mention of misadventure at the time of the procedure: Secondary | ICD-10-CM | POA: Diagnosis present

## 2018-02-08 DIAGNOSIS — Z882 Allergy status to sulfonamides status: Secondary | ICD-10-CM | POA: Diagnosis not present

## 2018-02-08 DIAGNOSIS — E781 Pure hyperglyceridemia: Secondary | ICD-10-CM | POA: Diagnosis present

## 2018-02-08 DIAGNOSIS — T8744 Infection of amputation stump, left lower extremity: Secondary | ICD-10-CM | POA: Diagnosis not present

## 2018-02-08 DIAGNOSIS — K5190 Ulcerative colitis, unspecified, without complications: Secondary | ICD-10-CM | POA: Diagnosis not present

## 2018-02-08 DIAGNOSIS — K219 Gastro-esophageal reflux disease without esophagitis: Secondary | ICD-10-CM | POA: Diagnosis not present

## 2018-02-08 DIAGNOSIS — J449 Chronic obstructive pulmonary disease, unspecified: Secondary | ICD-10-CM | POA: Diagnosis not present

## 2018-02-08 DIAGNOSIS — F329 Major depressive disorder, single episode, unspecified: Secondary | ICD-10-CM | POA: Diagnosis present

## 2018-02-08 DIAGNOSIS — Z8679 Personal history of other diseases of the circulatory system: Secondary | ICD-10-CM | POA: Diagnosis not present

## 2018-02-08 DIAGNOSIS — D62 Acute posthemorrhagic anemia: Secondary | ICD-10-CM | POA: Diagnosis not present

## 2018-02-08 DIAGNOSIS — I4891 Unspecified atrial fibrillation: Secondary | ICD-10-CM | POA: Diagnosis not present

## 2018-02-08 DIAGNOSIS — Z7982 Long term (current) use of aspirin: Secondary | ICD-10-CM

## 2018-02-08 DIAGNOSIS — Z79899 Other long term (current) drug therapy: Secondary | ICD-10-CM

## 2018-02-08 DIAGNOSIS — L988 Other specified disorders of the skin and subcutaneous tissue: Secondary | ICD-10-CM | POA: Diagnosis not present

## 2018-02-08 DIAGNOSIS — Z7901 Long term (current) use of anticoagulants: Secondary | ICD-10-CM | POA: Diagnosis not present

## 2018-02-08 DIAGNOSIS — D508 Other iron deficiency anemias: Secondary | ICD-10-CM | POA: Diagnosis not present

## 2018-02-08 DIAGNOSIS — I2510 Atherosclerotic heart disease of native coronary artery without angina pectoris: Secondary | ICD-10-CM | POA: Diagnosis present

## 2018-02-08 DIAGNOSIS — F418 Other specified anxiety disorders: Secondary | ICD-10-CM | POA: Diagnosis not present

## 2018-02-08 DIAGNOSIS — Z7401 Bed confinement status: Secondary | ICD-10-CM | POA: Diagnosis not present

## 2018-02-08 DIAGNOSIS — M069 Rheumatoid arthritis, unspecified: Secondary | ICD-10-CM | POA: Diagnosis not present

## 2018-02-08 DIAGNOSIS — M1990 Unspecified osteoarthritis, unspecified site: Secondary | ICD-10-CM | POA: Diagnosis not present

## 2018-02-08 DIAGNOSIS — F419 Anxiety disorder, unspecified: Secondary | ICD-10-CM | POA: Diagnosis present

## 2018-02-08 DIAGNOSIS — E7800 Pure hypercholesterolemia, unspecified: Secondary | ICD-10-CM | POA: Diagnosis present

## 2018-02-08 DIAGNOSIS — Z7952 Long term (current) use of systemic steroids: Secondary | ICD-10-CM

## 2018-02-08 DIAGNOSIS — F3289 Other specified depressive episodes: Secondary | ICD-10-CM | POA: Diagnosis not present

## 2018-02-08 DIAGNOSIS — I96 Gangrene, not elsewhere classified: Secondary | ICD-10-CM | POA: Diagnosis present

## 2018-02-08 DIAGNOSIS — Z888 Allergy status to other drugs, medicaments and biological substances status: Secondary | ICD-10-CM | POA: Diagnosis not present

## 2018-02-08 DIAGNOSIS — M6281 Muscle weakness (generalized): Secondary | ICD-10-CM | POA: Diagnosis not present

## 2018-02-08 DIAGNOSIS — L89890 Pressure ulcer of other site, unstageable: Secondary | ICD-10-CM | POA: Diagnosis present

## 2018-02-08 DIAGNOSIS — T8789 Other complications of amputation stump: Secondary | ICD-10-CM

## 2018-02-08 DIAGNOSIS — I70262 Atherosclerosis of native arteries of extremities with gangrene, left leg: Secondary | ICD-10-CM | POA: Diagnosis not present

## 2018-02-08 DIAGNOSIS — T879 Unspecified complications of amputation stump: Secondary | ICD-10-CM | POA: Diagnosis not present

## 2018-02-08 HISTORY — PX: AMPUTATION: SHX166

## 2018-02-08 LAB — CBC WITH DIFFERENTIAL/PLATELET
Basophils Absolute: 0 10*3/uL (ref 0–0.1)
Basophils Relative: 0 %
EOS PCT: 4 %
Eosinophils Absolute: 0.5 10*3/uL (ref 0–0.7)
HCT: 31.7 % — ABNORMAL LOW (ref 35.0–47.0)
Hemoglobin: 10.8 g/dL — ABNORMAL LOW (ref 12.0–16.0)
LYMPHS ABS: 1.3 10*3/uL (ref 1.0–3.6)
LYMPHS PCT: 13 %
MCH: 32 pg (ref 26.0–34.0)
MCHC: 34.1 g/dL (ref 32.0–36.0)
MCV: 93.7 fL (ref 80.0–100.0)
MONOS PCT: 11 %
Monocytes Absolute: 1.2 10*3/uL — ABNORMAL HIGH (ref 0.2–0.9)
Neutro Abs: 7.4 10*3/uL — ABNORMAL HIGH (ref 1.4–6.5)
Neutrophils Relative %: 72 %
PLATELETS: 360 10*3/uL (ref 150–440)
RBC: 3.38 MIL/uL — AB (ref 3.80–5.20)
RDW: 16.6 % — AB (ref 11.5–14.5)
WBC: 10.4 10*3/uL (ref 3.6–11.0)

## 2018-02-08 LAB — BASIC METABOLIC PANEL
Anion gap: 9 (ref 5–15)
BUN: 21 mg/dL (ref 8–23)
CHLORIDE: 105 mmol/L (ref 98–111)
CO2: 26 mmol/L (ref 22–32)
Calcium: 9.1 mg/dL (ref 8.9–10.3)
Creatinine, Ser: 0.53 mg/dL (ref 0.44–1.00)
GFR calc Af Amer: >60 mL/min (ref >60)
GFR calc non Af Amer: >60 mL/min (ref >60)
GLUCOSE: 133 mg/dL — AB (ref 70–99)
POTASSIUM: 3.9 mmol/L (ref 3.5–5.1)
Sodium: 140 mmol/L (ref 135–145)

## 2018-02-08 LAB — TYPE AND SCREEN
ABO/RH(D): A POS
ANTIBODY SCREEN: NEGATIVE

## 2018-02-08 LAB — PROTIME-INR
INR: 1.12
Prothrombin Time: 14.3 seconds (ref 11.4–15.2)

## 2018-02-08 LAB — APTT: aPTT: 30 seconds (ref 24–36)

## 2018-02-08 SURGERY — AMPUTATION BELOW KNEE
Anesthesia: General | Laterality: Left | Wound class: Contaminated

## 2018-02-08 MED ORDER — CALCIUM CARBONATE-VITAMIN D 500-200 MG-UNIT PO TABS
1.0000 | ORAL_TABLET | Freq: Two times a day (BID) | ORAL | Status: DC
  Administered 2018-02-09 – 2018-02-13 (×8): 1 via ORAL
  Filled 2018-02-08 (×8): qty 1

## 2018-02-08 MED ORDER — OXYCODONE HCL 5 MG PO TABS
5.0000 mg | ORAL_TABLET | ORAL | Status: DC | PRN
  Administered 2018-02-08 – 2018-02-13 (×9): 5 mg via ORAL
  Filled 2018-02-08 (×11): qty 1

## 2018-02-08 MED ORDER — FENTANYL CITRATE (PF) 100 MCG/2ML IJ SOLN
25.0000 ug | INTRAMUSCULAR | Status: DC | PRN
  Administered 2018-02-08 (×4): 25 ug via INTRAVENOUS

## 2018-02-08 MED ORDER — PANTOPRAZOLE SODIUM 40 MG PO TBEC
40.0000 mg | DELAYED_RELEASE_TABLET | Freq: Every day | ORAL | Status: DC
  Administered 2018-02-09 – 2018-02-13 (×5): 40 mg via ORAL
  Filled 2018-02-08 (×5): qty 1

## 2018-02-08 MED ORDER — ONDANSETRON HCL 4 MG/2ML IJ SOLN
INTRAMUSCULAR | Status: AC
  Filled 2018-02-08: qty 2

## 2018-02-08 MED ORDER — CHLORHEXIDINE GLUCONATE CLOTH 2 % EX PADS: 6.0000 | MEDICATED_PAD | Freq: Once | CUTANEOUS | Status: DC

## 2018-02-08 MED ORDER — ONDANSETRON HCL 4 MG/2ML IJ SOLN
4.0000 mg | Freq: Four times a day (QID) | INTRAMUSCULAR | Status: DC | PRN
  Administered 2018-02-09: 4 mg via INTRAVENOUS
  Filled 2018-02-08: qty 2

## 2018-02-08 MED ORDER — PROPOFOL 10 MG/ML IV BOLUS
INTRAVENOUS | Status: AC
  Filled 2018-02-08: qty 20

## 2018-02-08 MED ORDER — LEFLUNOMIDE 20 MG PO TABS
20.0000 mg | ORAL_TABLET | Freq: Every day | ORAL | Status: DC
  Administered 2018-02-09 – 2018-02-13 (×5): 20 mg via ORAL
  Filled 2018-02-08 (×5): qty 1

## 2018-02-08 MED ORDER — FERROUS SULFATE 325 (65 FE) MG PO TABS
325.0000 mg | ORAL_TABLET | Freq: Every day | ORAL | Status: DC
  Administered 2018-02-09 – 2018-02-13 (×5): 325 mg via ORAL
  Filled 2018-02-08 (×5): qty 1

## 2018-02-08 MED ORDER — HYDROMORPHONE HCL 1 MG/ML IJ SOLN
INTRAMUSCULAR | Status: AC
  Filled 2018-02-08: qty 1

## 2018-02-08 MED ORDER — DEXTROSE-NACL 5-0.9 % IV SOLN
INTRAVENOUS | Status: DC
  Administered 2018-02-08 – 2018-02-10 (×4): via INTRAVENOUS

## 2018-02-08 MED ORDER — HYDROMORPHONE HCL 1 MG/ML IJ SOLN
0.5000 mg | INTRAMUSCULAR | Status: DC | PRN
  Administered 2018-02-08 – 2018-02-10 (×10): 0.5 mg via INTRAVENOUS
  Filled 2018-02-08 (×8): qty 0.5

## 2018-02-08 MED ORDER — DEXMEDETOMIDINE HCL IN NACL 80 MCG/20ML IV SOLN
INTRAVENOUS | Status: AC
  Filled 2018-02-08: qty 20

## 2018-02-08 MED ORDER — FENTANYL CITRATE (PF) 100 MCG/2ML IJ SOLN
INTRAMUSCULAR | Status: DC | PRN
  Administered 2018-02-08: 25 ug via INTRAVENOUS
  Administered 2018-02-08 (×2): 50 ug via INTRAVENOUS
  Administered 2018-02-08: 25 ug via INTRAVENOUS
  Administered 2018-02-08: 50 ug via INTRAVENOUS

## 2018-02-08 MED ORDER — PRAVASTATIN SODIUM 20 MG PO TABS
80.0000 mg | ORAL_TABLET | Freq: Every day | ORAL | Status: DC
  Administered 2018-02-08 – 2018-02-12 (×5): 80 mg via ORAL
  Filled 2018-02-08 (×5): qty 4

## 2018-02-08 MED ORDER — HYDROMORPHONE HCL 1 MG/ML IJ SOLN
INTRAMUSCULAR | Status: AC
  Administered 2018-02-08: 0.5 mg via INTRAVENOUS
  Filled 2018-02-08: qty 1

## 2018-02-08 MED ORDER — CHLORHEXIDINE GLUCONATE CLOTH 2 % EX PADS
6.0000 | MEDICATED_PAD | Freq: Every day | CUTANEOUS | Status: AC
  Administered 2018-02-09 – 2018-02-13 (×5): 6 via TOPICAL

## 2018-02-08 MED ORDER — BUDESONIDE 3 MG PO CPEP
6.0000 mg | ORAL_CAPSULE | ORAL | Status: DC
  Administered 2018-02-09 – 2018-02-13 (×3): 6 mg via ORAL
  Filled 2018-02-08 (×3): qty 2

## 2018-02-08 MED ORDER — MELATONIN 5 MG PO TABS
2.5000 mg | ORAL_TABLET | Freq: Every day | ORAL | Status: DC
  Administered 2018-02-09 – 2018-02-10 (×2): 2.5 mg via ORAL
  Filled 2018-02-08 (×6): qty 0.5

## 2018-02-08 MED ORDER — FENTANYL CITRATE (PF) 100 MCG/2ML IJ SOLN
INTRAMUSCULAR | Status: AC
  Filled 2018-02-08: qty 2

## 2018-02-08 MED ORDER — LIDOCAINE HCL (CARDIAC) PF 100 MG/5ML IV SOSY
PREFILLED_SYRINGE | INTRAVENOUS | Status: DC | PRN
  Administered 2018-02-08: 60 mg via INTRAVENOUS

## 2018-02-08 MED ORDER — PHENYLEPHRINE HCL 10 MG/ML IJ SOLN
INTRAMUSCULAR | Status: DC | PRN
  Administered 2018-02-08: 100 ug via INTRAVENOUS

## 2018-02-08 MED ORDER — DILTIAZEM HCL ER COATED BEADS 120 MG PO CP24
240.0000 mg | ORAL_CAPSULE | Freq: Every day | ORAL | Status: DC
  Administered 2018-02-13: 240 mg via ORAL
  Filled 2018-02-08 (×4): qty 2

## 2018-02-08 MED ORDER — PROPOFOL 500 MG/50ML IV EMUL
INTRAVENOUS | Status: AC
  Filled 2018-02-08: qty 50

## 2018-02-08 MED ORDER — ADULT MULTIVITAMIN W/MINERALS CH
1.0000 | ORAL_TABLET | Freq: Every day | ORAL | Status: DC
  Administered 2018-02-09 – 2018-02-13 (×5): 1 via ORAL
  Filled 2018-02-08 (×5): qty 1

## 2018-02-08 MED ORDER — BUDESONIDE 3 MG PO CPEP
3.0000 mg | ORAL_CAPSULE | ORAL | Status: DC
  Administered 2018-02-10 – 2018-02-12 (×2): 3 mg via ORAL
  Filled 2018-02-08 (×2): qty 1

## 2018-02-08 MED ORDER — GABAPENTIN 300 MG PO CAPS
300.0000 mg | ORAL_CAPSULE | Freq: Every day | ORAL | Status: DC
  Administered 2018-02-08 – 2018-02-12 (×5): 300 mg via ORAL
  Filled 2018-02-08 (×5): qty 1

## 2018-02-08 MED ORDER — ONDANSETRON HCL 4 MG/2ML IJ SOLN: 4.0000 mg | Freq: Once | INTRAMUSCULAR | Status: DC | PRN

## 2018-02-08 MED ORDER — CEFAZOLIN SODIUM-DEXTROSE 2-4 GM/100ML-% IV SOLN
INTRAVENOUS | Status: AC
  Filled 2018-02-08: qty 100

## 2018-02-08 MED ORDER — SUCCINYLCHOLINE CHLORIDE 20 MG/ML IJ SOLN
INTRAMUSCULAR | Status: DC | PRN
  Administered 2018-02-08: 60 mg via INTRAVENOUS

## 2018-02-08 MED ORDER — GEMFIBROZIL 600 MG PO TABS
600.0000 mg | ORAL_TABLET | Freq: Two times a day (BID) | ORAL | Status: DC
  Administered 2018-02-09 – 2018-02-13 (×9): 600 mg via ORAL
  Filled 2018-02-08 (×10): qty 1

## 2018-02-08 MED ORDER — OCUVITE-LUTEIN PO CAPS
ORAL_CAPSULE | Freq: Two times a day (BID) | ORAL | Status: DC
  Administered 2018-02-09 – 2018-02-13 (×7): 1 via ORAL
  Filled 2018-02-08 (×11): qty 1

## 2018-02-08 MED ORDER — MAGNESIUM HYDROXIDE 400 MG/5ML PO SUSP
30.0000 mL | Freq: Every day | ORAL | Status: DC | PRN
  Filled 2018-02-08: qty 30

## 2018-02-08 MED ORDER — SERTRALINE HCL 50 MG PO TABS
100.0000 mg | ORAL_TABLET | Freq: Every day | ORAL | Status: DC
  Administered 2018-02-08 – 2018-02-12 (×5): 100 mg via ORAL
  Filled 2018-02-08 (×5): qty 2

## 2018-02-08 MED ORDER — ONDANSETRON HCL 4 MG/2ML IJ SOLN
INTRAMUSCULAR | Status: DC | PRN
  Administered 2018-02-08: 4 mg via INTRAVENOUS

## 2018-02-08 MED ORDER — FENTANYL CITRATE (PF) 100 MCG/2ML IJ SOLN
INTRAMUSCULAR | Status: AC
  Administered 2018-02-08: 25 ug via INTRAVENOUS
  Filled 2018-02-08: qty 2

## 2018-02-08 MED ORDER — LISINOPRIL 10 MG PO TABS
10.0000 mg | ORAL_TABLET | Freq: Every day | ORAL | Status: DC
  Filled 2018-02-08 (×4): qty 1

## 2018-02-08 MED ORDER — OXYCODONE HCL 5 MG PO TABS
ORAL_TABLET | ORAL | Status: AC
  Administered 2018-02-08: 5 mg via ORAL
  Filled 2018-02-08: qty 1

## 2018-02-08 MED ORDER — FLEET ENEMA 7-19 GM/118ML RE ENEM: 1.0000 | ENEMA | Freq: Once | RECTAL | Status: DC | PRN

## 2018-02-08 MED ORDER — DEXMEDETOMIDINE HCL IN NACL 200 MCG/50ML IV SOLN
INTRAVENOUS | Status: DC | PRN
  Administered 2018-02-08 (×2): 4 ug via INTRAVENOUS

## 2018-02-08 MED ORDER — PREDNISONE 5 MG PO TABS
5.0000 mg | ORAL_TABLET | Freq: Every day | ORAL | Status: DC
  Administered 2018-02-09 – 2018-02-13 (×5): 5 mg via ORAL
  Filled 2018-02-08 (×5): qty 1

## 2018-02-08 MED ORDER — ONDANSETRON HCL 4 MG PO TABS: 4.0000 mg | ORAL_TABLET | Freq: Four times a day (QID) | ORAL | Status: DC | PRN

## 2018-02-08 MED ORDER — SUCCINYLCHOLINE CHLORIDE 20 MG/ML IJ SOLN
INTRAMUSCULAR | Status: AC
  Filled 2018-02-08: qty 1

## 2018-02-08 MED ORDER — MUPIROCIN 2 % EX OINT
1.0000 "application " | TOPICAL_OINTMENT | Freq: Two times a day (BID) | CUTANEOUS | Status: AC
  Administered 2018-02-08 – 2018-02-13 (×10): 1 via NASAL
  Filled 2018-02-08: qty 22

## 2018-02-08 MED ORDER — BUPIVACAINE-EPINEPHRINE (PF) 0.25% -1:200000 IJ SOLN
INTRAMUSCULAR | Status: AC
  Filled 2018-02-08: qty 30

## 2018-02-08 MED ORDER — EPHEDRINE SULFATE 50 MG/ML IJ SOLN
INTRAMUSCULAR | Status: AC
  Filled 2018-02-08: qty 1

## 2018-02-08 MED ORDER — BUPIVACAINE-EPINEPHRINE (PF) 0.25% -1:200000 IJ SOLN
INTRAMUSCULAR | Status: DC | PRN
  Administered 2018-02-08: 10 mL via PERINEURAL

## 2018-02-08 MED ORDER — LORAZEPAM 1 MG PO TABS
1.0000 mg | ORAL_TABLET | Freq: Two times a day (BID) | ORAL | Status: DC
  Administered 2018-02-08 – 2018-02-13 (×8): 1 mg via ORAL
  Filled 2018-02-08 (×9): qty 1

## 2018-02-08 MED ORDER — HYDROXYCHLOROQUINE SULFATE 200 MG PO TABS
200.0000 mg | ORAL_TABLET | Freq: Two times a day (BID) | ORAL | Status: DC
  Administered 2018-02-08 – 2018-02-13 (×10): 200 mg via ORAL
  Filled 2018-02-08 (×11): qty 1

## 2018-02-08 MED ORDER — LACTATED RINGERS IV SOLN
INTRAVENOUS | Status: DC
  Administered 2018-02-08: 07:00:00 via INTRAVENOUS

## 2018-02-08 MED ORDER — ASPIRIN EC 81 MG PO TBEC
81.0000 mg | DELAYED_RELEASE_TABLET | Freq: Every day | ORAL | Status: DC
  Administered 2018-02-09 – 2018-02-10 (×2): 81 mg via ORAL
  Filled 2018-02-08 (×2): qty 1

## 2018-02-08 MED ORDER — PROPOFOL 10 MG/ML IV BOLUS
INTRAVENOUS | Status: DC | PRN
  Administered 2018-02-08: 60 mg via INTRAVENOUS

## 2018-02-08 MED ORDER — SORBITOL 70 % SOLN
30.0000 mL | Freq: Every day | Status: DC | PRN
  Filled 2018-02-08: qty 30

## 2018-02-08 MED ORDER — DOCUSATE SODIUM 100 MG PO CAPS
100.0000 mg | ORAL_CAPSULE | Freq: Two times a day (BID) | ORAL | Status: DC
  Administered 2018-02-09 – 2018-02-12 (×4): 100 mg via ORAL
  Filled 2018-02-08 (×9): qty 1

## 2018-02-08 SURGICAL SUPPLY — 49 items
BAG COUNTER SPONGE EZ (MISCELLANEOUS) ×2 IMPLANT
BANDAGE ELASTIC 4 LF NS (GAUZE/BANDAGES/DRESSINGS) ×3 IMPLANT
BLADE SAGITTAL WIDE XTHICK NO (BLADE) ×3 IMPLANT
BLADE SAW GIGLI 510 (BLADE) ×2 IMPLANT
BLADE SAW GIGLI 510MM (BLADE) ×1
BLADE SURG SZ10 CARB STEEL (BLADE) ×3 IMPLANT
BNDG COHESIVE 4X5 TAN STRL (GAUZE/BANDAGES/DRESSINGS) ×3 IMPLANT
BNDG GAUZE 4.5X4.1 6PLY STRL (MISCELLANEOUS) ×3 IMPLANT
BRUSH SCRUB EZ  4% CHG (MISCELLANEOUS) ×2
BRUSH SCRUB EZ 4% CHG (MISCELLANEOUS) ×1 IMPLANT
CANISTER SUCT 1200ML W/VALVE (MISCELLANEOUS) ×3 IMPLANT
CHLORAPREP W/TINT 26ML (MISCELLANEOUS) ×3 IMPLANT
COUNTER SPONGE BAG EZ (MISCELLANEOUS) ×1
DRAPE INCISE IOBAN 66X45 STRL (DRAPES) ×6 IMPLANT
DRESSING SURGICEL FIBRLLR 1X2 (HEMOSTASIS) ×1 IMPLANT
DRSG GAUZE FLUFF 36X18 (GAUZE/BANDAGES/DRESSINGS) ×3 IMPLANT
DRSG SURGICEL FIBRILLAR 1X2 (HEMOSTASIS) ×3
ELECT CAUTERY BLADE 6.4 (BLADE) ×3 IMPLANT
ELECT REM PT RETURN 9FT ADLT (ELECTROSURGICAL) ×3
ELECTRODE REM PT RTRN 9FT ADLT (ELECTROSURGICAL) ×1 IMPLANT
GAUZE PETRO XEROFOAM 1X8 (MISCELLANEOUS) ×3 IMPLANT
GAUZE SPONGE 4X4 12PLY STRL (GAUZE/BANDAGES/DRESSINGS) ×3 IMPLANT
GLOVE BIO SURGEON STRL SZ7 (GLOVE) ×9 IMPLANT
GLOVE INDICATOR 7.5 STRL GRN (GLOVE) ×9 IMPLANT
GLOVE SURG SYN 8.0 (GLOVE) ×3 IMPLANT
GOWN STRL REUS W/ TWL LRG LVL3 (GOWN DISPOSABLE) ×2 IMPLANT
GOWN STRL REUS W/ TWL XL LVL3 (GOWN DISPOSABLE) ×1 IMPLANT
GOWN STRL REUS W/TWL LRG LVL3 (GOWN DISPOSABLE) ×4
GOWN STRL REUS W/TWL XL LVL3 (GOWN DISPOSABLE) ×2
HANDLE YANKAUER SUCT BULB TIP (MISCELLANEOUS) ×3 IMPLANT
KIT TURNOVER KIT A (KITS) ×3 IMPLANT
LABEL OR SOLS (LABEL) ×3 IMPLANT
NS IRRIG 500ML POUR BTL (IV SOLUTION) ×3 IMPLANT
PACK EXTREMITY ARMC (MISCELLANEOUS) ×3 IMPLANT
PAD ABD DERMACEA PRESS 5X9 (GAUZE/BANDAGES/DRESSINGS) ×3 IMPLANT
PAD PREP 24X41 OB/GYN DISP (PERSONAL CARE ITEMS) ×3 IMPLANT
SPONGE LAP 18X18 RF (DISPOSABLE) ×3 IMPLANT
STAPLER SKIN PROX 35W (STAPLE) ×3 IMPLANT
STOCKINETTE M/LG 89821 (MISCELLANEOUS) ×3 IMPLANT
SUT SILK 2 0 (SUTURE) ×2
SUT SILK 2-0 18XBRD TIE 12 (SUTURE) ×1 IMPLANT
SUT SILK 3 0 (SUTURE) ×2
SUT SILK 3-0 18XBRD TIE 12 (SUTURE) ×1 IMPLANT
SUT VIC AB 0 CT1 36 (SUTURE) ×12 IMPLANT
SUT VIC AB 3-0 SH 27 (SUTURE) ×12
SUT VIC AB 3-0 SH 27X BRD (SUTURE) ×6 IMPLANT
SUT VICRYL PLUS ABS 0 54 (SUTURE) ×3 IMPLANT
TAPE UMBIL 1/8X18 RADIOPA (MISCELLANEOUS) ×3 IMPLANT
TOWEL OR 17X26 4PK STRL BLUE (TOWEL DISPOSABLE) ×6 IMPLANT

## 2018-02-08 NOTE — Progress Notes (Signed)
Pt ready to be discharged to the floor. Pt on bed hold. Will continue to care for pt with floor orders. Melissa [name] E 11:17 AM 02/08/2018

## 2018-02-08 NOTE — Anesthesia Preprocedure Evaluation (Signed)
Anesthesia Evaluation  Patient identified by MRN, date of birth, ID band Patient unresponsive    Reviewed: Allergy & Precautions, H&P , NPO status , Patient's Chart, lab work & pertinent test results  History of Anesthesia Complications Negative for: history of anesthetic complications  Airway Mallampati: III  TM Distance: >3 FB Neck ROM: full    Dental  (+) Poor Dentition, Edentulous Upper, Edentulous Lower   Pulmonary neg pulmonary ROS, shortness of breath, COPD,           Cardiovascular Exercise Tolerance: Poor hypertension, + CAD, + Past MI and + Peripheral Vascular Disease       Neuro/Psych PSYCHIATRIC DISORDERS Anxiety Depression negative neurological ROS     GI/Hepatic Neg liver ROS, GERD  Medicated and Controlled,  Endo/Other  negative endocrine ROS  Renal/GU      Musculoskeletal  (+) Arthritis ,   Abdominal   Peds  Hematology negative hematology ROS (+) anemia ,   Anesthesia Other Findings Past Medical History: No date: Anemia No date: Anxiety No date: Arthritis     Comment:  rheumatoid No date: Ataxia No date: COPD (chronic obstructive pulmonary disease) (HCC) No date: Depression No date: Diverticulosis No date: Excessive falling No date: GERD (gastroesophageal reflux disease) No date: Hyperlipidemia No date: Hypertension No date: IBS (irritable bowel syndrome) No date: Osteoarthritis No date: Psoriasis No date: Raynaud's disease without gangrene No date: Raynaud's disease without gangrene No date: Shortness of breath dyspnea     Comment:  with exertion  Past Surgical History: No date: ABDOMINAL HYSTERECTOMY No date: APPENDECTOMY No date: BACK SURGERY     Comment:  Spinal fusion with rods and screws No date: CHOLECYSTECTOMY 01/29/2017: COLONOSCOPY WITH PROPOFOL; N/A     Comment:  Procedure: COLONOSCOPY WITH PROPOFOL;  Surgeon: Manya Silvas, MD;  Location: [facility]  ENDOSCOPY;  Service:               Endoscopy;  Laterality: N/A; 01/29/2017: ESOPHAGOGASTRODUODENOSCOPY (EGD) WITH PROPOFOL; N/A     Comment:  Procedure: ESOPHAGOGASTRODUODENOSCOPY (EGD) WITH               PROPOFOL;  Surgeon: Manya Silvas, MD;  Location:               [facility] ENDOSCOPY;  Service: Endoscopy;  Laterality: N/A; No date: EYE SURGERY; Bilateral     Comment:  Cataract Extraction with IOL No date: KNEE ARTHROSCOPY; Left     Comment:  partial lateral meniscectomy, debridement, excision of               Baker's cyst 04/23/2017: LEFT HEART CATH AND CORONARY ANGIOGRAPHY; N/A     Comment:  Procedure: LEFT HEART CATH AND CORONARY ANGIOGRAPHY;                Surgeon: Teodoro Spray, MD;  Location: [place] CV              LAB;  Service: Cardiovascular;  Laterality: N/A; 09/07/2017: LEFT HEART CATH AND CORONARY ANGIOGRAPHY; Left     Comment:  Procedure: LEFT HEART CATH AND CORONARY ANGIOGRAPHY;                Surgeon: Teodoro Spray, MD;  Location: [place] CV              LAB;  Service: Cardiovascular;  Laterality: Left; 11/27/2017: LOWER EXTREMITY ANGIOGRAPHY; Left     Comment:  Procedure:  LOWER EXTREMITY ANGIOGRAPHY;  Surgeon:               Katha Cabal, MD;  Location: [place] CV LAB;               Service: Cardiovascular;  Laterality: Left; 12/05/2017: LOWER EXTREMITY ANGIOGRAPHY; Left     Comment:  Procedure: LOWER EXTREMITY ANGIOGRAPHY;  Surgeon:               Katha Cabal, MD;  Location: [place] CV LAB;               Service: Cardiovascular;  Laterality: Left; No date: ROTATOR CUFF REPAIR; Right 04/04/2016: TOTAL KNEE ARTHROPLASTY; Left     Comment:  Procedure: TOTAL KNEE ARTHROPLASTY;  Surgeon: Hessie Knows, MD;  Location: [facility] ORS;  Service: Orthopedics;                Laterality: Left;  BMI    Body Mass Index:  20.94 kg/m      Reproductive/Obstetrics negative OB ROS                              Anesthesia Physical  Anesthesia Plan  ASA: III  Anesthesia Plan: General ETT   Post-op Pain Management:    Induction: Intravenous  PONV Risk Score and Plan:   Airway Management Planned: Oral ETT  Additional Equipment:   Intra-op Plan:   Post-operative Plan: Extubation in OR and Possible Post-op intubation/ventilation  Informed Consent: I have reviewed the patients History and Physical, chart, labs and discussed the procedure including the risks, benefits and alternatives for the proposed anesthesia with the patient or authorized representative who has indicated his/her understanding and acceptance.   Dental Advisory Given  Plan Discussed with: Anesthesiologist, CRNA and Surgeon  Anesthesia Plan Comments: (Patient and family informed that patient is higher risk for complications from anesthesia during this procedure due to their medical history and age including but not limited to post operative cognitive dysfunction.  They voiced understanding.  Daughter consented for risks of anesthesia including but not limited to:  - adverse reactions to medications - damage to teeth, lips or other oral mucosa - sore throat or hoarseness - Damage to heart, brain, lungs or loss of life  Daughter voiced understanding.)        Anesthesia Quick Evaluation

## 2018-02-08 NOTE — H&P (Signed)
[facility] History & Physical Update  The patient was interviewed and re-examined.  The patient's previous History and Physical has been reviewed and is unchanged.  There is no change in the plan of care. We plan to proceed with the scheduled procedure.  [name], MD  02/08/2018, 7:26 AM

## 2018-02-08 NOTE — Transfer of Care (Signed)
Immediate Anesthesia Transfer of Care Note  Patient: Melissa [name]  Procedure(s) Performed: AMPUTATION BELOW KNEE ( REVISION ) (Left )  Patient Location: PACU  Anesthesia Type:General  Level of Consciousness: sedated  Airway & Oxygen Therapy: Patient Spontanous Breathing and Patient connected to face mask oxygen  Post-op Assessment: Report given to RN and Post -op Vital signs reviewed and stable  Post vital signs: Reviewed and stable  Last Vitals:  Vitals Value Taken Time  BP 174/84 02/08/2018 10:02 AM  Temp    Pulse 92 02/08/2018 10:02 AM  Resp 23 02/08/2018 10:02 AM  SpO2 100 % 02/08/2018 10:02 AM  Vitals shown include unvalidated device data.  Last Pain:  Vitals:   02/08/18 0624  TempSrc: Oral  PainSc: 5          Complications: No apparent anesthesia complications

## 2018-02-08 NOTE — Op Note (Signed)
   OPERATIVE NOTE   PROCEDURE: Revision left below-the-knee amputation  PRE-OPERATIVE DIAGNOSIS: Breakdown of left below-knee amputation with gangrene  POST-OPERATIVE DIAGNOSIS: same as above  SURGEON: Ella Jubilee, MD  ASSISTANT(S): Ms. Hezzie Bump  ANESTHESIA: general  ESTIMATED BLOOD LOSS: 100 cc  FINDING(S): Viable muscle bellies  SPECIMEN(S):  Left below-the-knee amputation debrided tissue including tibial bone muscle and fascia  INDICATIONS:   Melissa [name] is a 77 y.o. female who presents with left leg gangrene.  The patient is scheduled for a left below-the-knee amputation.  I discussed in depth with the patient the risks, benefits, and alternatives to this procedure.  The patient is aware that the risk of this operation included but are not limited to:  bleeding, infection, myocardial infarction, stroke, death, failure to heal amputation wound, and possible need for more proximal amputation.  The patient is aware of the risks and agrees proceed forward with the procedure.  DESCRIPTION:  After full informed written consent was obtained from the patient, the patient was brought back to the operating room, and placed supine upon the operating table.  Prior to induction, the patient received IV antibiotics.  The patient was then prepped and draped in the standard fashion for a revision below-the-knee amputation.  After obtaining adequate anesthesia, the patient was prepped and draped in the standard fashion for a left below-the-knee amputation.    The C-arm was then brought in the left knee visualized.  The inferior most aspect of the knee prosthesis was then marked.  I began by elevating  the periosteal tissue superiorly so that the tibia was about 3 cm shorter than the anterior skin flap.  Extended the incision laterally medially.  I then transected the tibia with a power saw and then took a wedge off the tibia anteriorly with the power saw.  Then I smoothed out the rough  edges.    The posterior muscle bellies were then dissected and a flap created between the gastroc muscles and the skin posteriorly.  I then transected muscle to allow for tension-free closure.  This was performed with Mayo scissors and Bovie cautery.  At this point, I clamped all visibly bleeding arteries and veins using a combination of suture ligation with Silk suture and electrocautery.  Bleeding continued to be controlled with electrocautery and suture ligature.  The stump was washed off with sterile normal saline and no further active bleeding was noted.    I reapproximated the anterior and posterior fascia  with interrupted stitches of 0 Vicryl.  This was completed along the entire length of anterior and posterior fascia until there were no more loose space in the fascial line. The skin was then  reapproximated with 3-0 nylon vertical mattress sutures.  The stump was washed off and dried.  The incision was dressed with Xeroform and  then fluffs were applied.  Kerlix was wrapped around the leg and then gently an ACE wrap was applied.    COMPLICATIONS: none  CONDITION: stable   [name]  02/08/2018, 10:16 AM    This note was created with Dragon Medical transcription system. Any errors in dictation are purely unintentional.

## 2018-02-08 NOTE — Anesthesia Post-op Follow-up Note (Signed)
Anesthesia QCDR form completed.        

## 2018-02-08 NOTE — Anesthesia Procedure Notes (Signed)
Procedure Name: Intubation Date/Time: 02/08/2018 7:44 AM Performed by: Hedda Slade, CRNA Pre-anesthesia Checklist: Patient identified, Patient being monitored, Timeout performed, Emergency Drugs available and Suction available Patient Re-evaluated:Patient Re-evaluated prior to induction Oxygen Delivery Method: Circle system utilized Preoxygenation: Pre-oxygenation with 100% oxygen Induction Type: IV induction Ventilation: Mask ventilation without difficulty Laryngoscope Size: Mac and 3 Grade View: Grade I Tube type: Oral Tube size: 7.0 mm Number of attempts: 1 Airway Equipment and Method: Stylet Placement Confirmation: ETT inserted through vocal cords under direct vision,  positive ETCO2 and breath sounds checked- equal and bilateral Secured at: 21 cm Tube secured with: Tape Dental Injury: Teeth and Oropharynx as per pre-operative assessment

## 2018-02-08 NOTE — Consult Note (Addendum)
[facility] [place] NAME: Melissa [name]    MR#:  [id number]  DATE OF BIRTH:  May 12, 1941  DATE OF ADMISSION:  02/08/2018  PRIMARY CARE PHYSICIAN: [name], MD   REQUESTING/REFERRING PHYSICIAN: Dr Rica Koyanagi  CHIEF COMPLAINT:  Revision of BKA and medical management  HISTORY OF PRESENT ILLNESS:  Melissa [name]  is a 77 y.o. female was brought in for a BKA revision because she had an ulcer there.  Patient has a lot of pain in her left lower extremity BKA site.  Medical consult was called for medical management of multiple medical issues including peripheral vascular disease, coronary artery disease, hyperlipidemia and rheumatoid arthritis and hypertension.  Patient physically feels okay.  She was living at home with her husband and getting around and mostly a wheelchair.  Patient complains of joint pain all over with her rheumatoid arthritis.  PAST MEDICAL HISTORY:   Past Medical History:  Diagnosis Date  . Anemia 12/2017   blood transfusion after amputation  . Anxiety   . Arthritis    rheumatoid  . Ataxia   . Chronic ulcer of right great toe ([place]) 2019   slowly healing  . COPD (chronic obstructive pulmonary disease) ([place])   . Coronary artery disease 2018   complete blockage in one vessel, 40 % in another, 25% in 3rd vessell  . Depression   . Diverticulosis   . Dysrhythmia    atrial fib  . Excessive falling   . GERD (gastroesophageal reflux disease)    ulcerative colitis  . Hypercholesteremia   . Hyperlipidemia   . Hypertension   . IBS (irritable bowel syndrome)   . MRSA (methicillin resistant staph aureus) culture positive 11/2017   patient unaware of this diagnosis, was never informed or treated  . Myocardial infarction ([place]) 08/2017   had angio but no stents  . Osteoarthritis   . Psoriasis   . Raynaud's disease without gangrene   . Raynaud's disease without gangrene   . Shortness of breath dyspnea    with exertion   . 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 ABDOMINAL HYSTERECTOMY  1980  . AMPUTATION Left 12/12/2017   Procedure: AMPUTATION BELOW KNEE;  Surgeon: Katha Cabal, MD;  Location: [facility] ORS;  Service: Vascular;  Laterality: Left;  . APPENDECTOMY  1956  . APPLICATION OF WOUND VAC Left 01/25/2018   Procedure: APPLICATION OF WOUND VAC;  Surgeon: Katha Cabal, MD;  Location: [facility] ORS;  Service: Vascular;  Laterality: Left;  . BACK SURGERY  2013   Spinal fusion with rods and screws  . CHOLECYSTECTOMY    . COLONOSCOPY WITH PROPOFOL N/A 01/29/2017   Procedure: COLONOSCOPY WITH PROPOFOL;  Surgeon: Manya Silvas, MD;  Location: [facility] ENDOSCOPY;  Service: Endoscopy;  Laterality: N/A;  . ESOPHAGOGASTRODUODENOSCOPY (EGD) WITH PROPOFOL N/A 01/29/2017   Procedure: ESOPHAGOGASTRODUODENOSCOPY (EGD) WITH PROPOFOL;  Surgeon: Manya Silvas, MD;  Location: [facility] ENDOSCOPY;  Service: Endoscopy;  Laterality: N/A;  . EYE SURGERY Bilateral 2012   Cataract Extraction with IOL  . KNEE ARTHROSCOPY Left 2009   partial lateral meniscectomy, debridement, excision of Baker's cyst  . LEFT HEART CATH AND CORONARY ANGIOGRAPHY N/A 04/23/2017   Procedure: LEFT HEART CATH AND CORONARY ANGIOGRAPHY;  Surgeon: Teodoro Spray, MD;  Location: [place] [facility] CV LAB;  Service: Cardiovascular;  Laterality: N/A;  . LEFT  HEART CATH AND CORONARY ANGIOGRAPHY Left 09/07/2017   Procedure: LEFT HEART CATH AND CORONARY ANGIOGRAPHY;  Surgeon: Teodoro Spray, MD;  Location: [place] CV LAB;  Service: Cardiovascular;  Laterality: Left;  . LOWER EXTREMITY ANGIOGRAPHY Left 11/27/2017   Procedure: LOWER EXTREMITY ANGIOGRAPHY;  Surgeon: Katha Cabal, MD;  Location: [place] CV LAB;  Service: Cardiovascular;  Laterality: Left;  . LOWER EXTREMITY ANGIOGRAPHY Left 12/05/2017   Procedure: LOWER EXTREMITY ANGIOGRAPHY;   Surgeon: Katha Cabal, MD;  Location: [place] CV LAB;  Service: Cardiovascular;  Laterality: Left;  . ROTATOR CUFF REPAIR Right 1990  . TOTAL KNEE ARTHROPLASTY Left 04/04/2016   Procedure: TOTAL KNEE ARTHROPLASTY;  Surgeon: Hessie Knows, MD;  Location: [facility] ORS;  Service: Orthopedics;  Laterality: Left;  . WOUND DEBRIDEMENT Left 01/25/2018   Procedure: DEBRIDEMENT WOUND;  Surgeon: Katha Cabal, MD;  Location: [facility] ORS;  Service: Vascular;  Laterality: Left;    SOCIAL HISTORY:   Social History   Tobacco Use  . Smoking status: Never Smoker  . Smokeless tobacco: Never Used  Substance Use Topics  . Alcohol use: No    FAMILY HISTORY:   Family History  Problem Relation Age of Onset  . CAD Mother   . CAD Father   . Alzheimer's disease Sister   . CAD Sister   . CAD Brother   . Breast cancer Neg Hx     DRUG ALLERGIES:   Allergies  Allergen Reactions  . Lodine [Etodolac] Hives and Itching  . Methotrexate Derivatives Other (See Comments)    Flu like symptoms  . Nitrofuran Derivatives Nausea And Vomiting and Other (See Comments)    Made sicker   . Levaquin [Levofloxacin] Itching, Rash and Other (See Comments)    Muscle pain  . Norvasc [Amlodipine] Other (See Comments)    Unknown   . Pentasa [Mesalamine Er] Other (See Comments)    Unknown   . Sulfa Antibiotics Itching and Rash  . Vioxx [Rofecoxib] Other (See Comments)    Unknown     REVIEW OF SYSTEMS:  CONSTITUTIONAL: No fever, fatigue or weakness.  Positive for weight loss EYES: No blurred or double vision.  Wears glasses. EARS, NOSE, AND THROAT: No tinnitus or ear pain.  RESPIRATORY: No cough.  Positive for shortness of breath with exertion.  No wheezing or hemoptysis.  CARDIOVASCULAR: No chest pain, orthopnea, edema.  GASTROINTESTINAL: No nausea, vomiting, diarrhea or abdominal pain.  GENITOURINARY: No dysuria, hematuria.  ENDOCRINE: No polyuria, nocturia,  HEMATOLOGY: No anemia, easy bruising or  bleeding SKIN: No rash or lesion. MUSCULOSKELETAL: Positive for joint pains all over NEUROLOGIC: No tingling, numbness, weakness.  PSYCHIATRY: No anxiety or depression.   MEDICATIONS AT HOME:   Prior to Admission medications   Medication Sig Start Date End Date Taking? Authorizing Provider  acetaminophen (TYLENOL) 500 MG tablet Take 1,000 mg by mouth every 8 (eight) hours as needed for mild pain or headache.   Yes [provider]  apixaban (ELIQUIS) 5 MG TABS tablet Take 1 tablet (5 mg total) by mouth 2 (two) times daily. 04/24/17  Yes Hillary Bow, MD  aspirin EC 81 MG tablet Take 1 tablet (81 mg total) by mouth daily. 04/24/17  Yes Sudini, Alveta Heimlich, MD  budesonide (ENTOCORT EC) 3 MG 24 hr capsule Take 3 capsules (9 mg total) by mouth daily. Taper down to 3 mg 12/19/17  Yes Schnier, Dolores Lory, MD  Cranberry 500 MG CAPS Take 500 mg by mouth daily.   Yes [provider]  diltiazem (CARDIZEM CD) 240 MG 24 hr capsule Take 240 mg by mouth daily.   Yes [provider]  ferrous sulfate 325 (65 FE) MG tablet Take 325 mg by mouth daily with breakfast.   Yes [provider]  gabapentin (NEURONTIN) 300 MG capsule Take 1 capsule (300 mg total) by mouth daily. 11/30/17  Yes Stegmayer, Janalyn Harder, PA-C  gemfibrozil (LOPID) 600 MG tablet Take 600 mg by mouth 2 (two) times daily before a meal.   Yes [provider]  hydroxychloroquine (PLAQUENIL) 200 MG tablet Take 200 mg by mouth 2 (two) times daily.    Yes [provider]  leflunomide (ARAVA) 20 MG tablet Take 20 mg by mouth daily.   Yes [provider]  lisinopril (PRINIVIL,ZESTRIL) 10 MG tablet Take 10 mg by mouth daily.   Yes [provider]  LORazepam (ATIVAN) 1 MG tablet Take 1 tablet (1 mg total) by mouth 2 (two) times daily as needed for anxiety. 04/24/17  Yes Sudini, Alveta Heimlich, MD  Melatonin 3 MG TABS Take by mouth at bedtime.   Yes [provider]  Multiple Vitamin  (MULTIVITAMIN) tablet Take 1 tablet by mouth daily.   Yes [provider]  Multiple Vitamins-Minerals (PRESERVISION AREDS) CAPS Take 1 capsule by mouth 2 (two) times daily.   Yes [provider]  omeprazole (PRILOSEC) 20 MG capsule Take 20 mg by mouth 2 (two) times daily before a meal.    Yes [provider]  oxyCODONE-acetaminophen (PERCOCET) 5-325 MG tablet Take 1 tablet by mouth every 6 (six) hours as needed for moderate pain or severe pain. 01/03/18  Yes Stegmayer, Janalyn Harder, PA-C  pravastatin (PRAVACHOL) 80 MG tablet Take 80 mg by mouth at bedtime.    Yes [provider]  predniSONE (DELTASONE) 5 MG tablet Take 5 mg by mouth daily with breakfast.    Yes [provider]  Calcium Carbonate-Vitamin D (CALCIUM 600+D) 600-400 MG-UNIT tablet Take 1 tablet by mouth 2 (two) times daily.     [provider]  sertraline (ZOLOFT) 50 MG tablet Take 50 mg by mouth at bedtime.     [provider]  tiZANidine (ZANAFLEX) 4 MG tablet Take 4 mg by mouth daily as needed for muscle spasms (neck pain).  06/20/17   [provider]  traMADol (ULTRAM) 50 MG tablet Take 1 tablet (50 mg total) by mouth every 6 (six) hours as needed for moderate pain or severe pain. 01/03/18   Stegmayer, Joelene Millin A, PA-C      VITAL SIGNS:  Blood pressure (!) 120/55, pulse 77, temperature 98.2 F (36.8 C), resp. rate 17, height 5' 4"  (1.626 m), weight 49 kg (108 lb), SpO2 97 %.  PHYSICAL EXAMINATION:  GENERAL:  77 y.o.-year-old patient lying in the bed with no acute distress.  EYES: Pupils equal, round, reactive to light and accommodation. No scleral icterus. Extraocular muscles intact.  HEENT: Head atraumatic, normocephalic. Oropharynx and nasopharynx clear.  NECK:  Supple, no jugular venous distention. No thyroid enlargement, no tenderness.  LUNGS: Normal breath sounds bilaterally, no wheezing, rales,rhonchi or crepitation. No use of accessory muscles of  respiration.  CARDIOVASCULAR: S1, S2 normal. No murmurs, rubs, or gallops.  ABDOMEN: Soft, nontender, nondistended. Bowel sounds present. No organomegaly or mass.  EXTREMITIES: No pedal edema, cyanosis, or clubbing.  Joint deformities on hands NEUROLOGIC: Cranial nerves II through XII are intact. Muscle strength 5/5 in all extremities. Sensation intact. Gait not checked.  PSYCHIATRIC: The patient is  alert and oriented x 3.  SKIN: No obvious rash, lesion, or ulcer.  BKA site covered with pressure dressing  LABORATORY PANEL:   CBC Recent Labs  Lab 02/08/18 0622  WBC 10.4  HGB 10.8*  HCT 31.7*  PLT 360   ------------------------------------------------------------------------------------------------------------------  Chemistries  Recent Labs  Lab 02/08/18 0622  NA 140  K 3.9  CL 105  CO2 26  GLUCOSE 133*  BUN 21  CREATININE 0.53  CALCIUM 9.1   ------------------------------------------------------------------------------------------------------------------    RADIOLOGY:  Dg C-arm 1-60 Min-no Report  Result Date: 02/08/2018 Fluoroscopy was utilized by the requesting physician.  No radiographic interpretation.      IMPRESSION AND PLAN:   1.  Rheumatoid arthritis on chronic prednisone, Plaquenil 2.  Essential hypertension on Cardizem CD and lisinopril 3.  Neuropathy on gabapentin 4.  History of CAD and peripheral vascular disease.  Restart Eliquis once okay by vascular surgery to do so.  Patient on aspirin 5.  History of ulcerative colitis on budesonide 6.  Depression and anxiety on Zoloft and Ativan 7.  Hypertriglyceridemia on gemfibrozil 8.  History of COPD.  Respiratory status stable 9.  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  Pain control as per vascular surgery.  Daughter mentioned that patient may end up going to rehab.  I have personally reviewed the patient's prior laboratory data and radiological findings. Management plans discussed with  the patient, family and they are in agreement.  CODE STATUS: Full code  TOTAL TIME TAKING CARE OF THIS PATIENT: 50 minutes.    Loletha Grayer M.D on 02/08/2018 at 6:08 PM  Between 7am to 6pm - Pager - [phone]  After 6pm go to [email or web address] - password [email or web address]  [facility]  Office  [phone]  CC: Primary care Physician: [name], MD

## 2018-02-09 LAB — BASIC METABOLIC PANEL
Anion gap: 6 (ref 5–15)
BUN: 13 mg/dL (ref 8–23)
CO2: 29 mmol/L (ref 22–32)
CREATININE: 0.53 mg/dL (ref 0.44–1.00)
Calcium: 8.3 mg/dL — ABNORMAL LOW (ref 8.9–10.3)
Chloride: 103 mmol/L (ref 98–111)
GFR calc Af Amer: >60 mL/min (ref >60)
GFR calc non Af Amer: >60 mL/min (ref >60)
Glucose, Bld: 195 mg/dL — ABNORMAL HIGH (ref 70–99)
POTASSIUM: 3.9 mmol/L (ref 3.5–5.1)
SODIUM: 138 mmol/L (ref 135–145)

## 2018-02-09 LAB — CBC
HEMATOCRIT: 26.8 % — AB (ref 35.0–47.0)
Hemoglobin: 8.9 g/dL — ABNORMAL LOW (ref 12.0–16.0)
MCH: 31.4 pg (ref 26.0–34.0)
MCHC: 33.3 g/dL (ref 32.0–36.0)
MCV: 94.3 fL (ref 80.0–100.0)
Platelets: 285 10*3/uL (ref 150–440)
RBC: 2.84 MIL/uL — ABNORMAL LOW (ref 3.80–5.20)
RDW: 16.3 % — ABNORMAL HIGH (ref 11.5–14.5)
WBC: 15.8 10*3/uL — AB (ref 3.6–11.0)

## 2018-02-09 NOTE — Progress Notes (Addendum)
[facility] at [place] NAME: Melissa [name]    MR#:  [id number]  DATE OF BIRTH:  1941-06-06  SUBJECTIVE:   Patient is status post left-sided BKA postop day #1 today.  Still having some significant pain near the surgical site.  Patient's daughter is at bedside.  No other acute events overnight.  REVIEW OF SYSTEMS:    Review of Systems  Constitutional: Negative for chills and fever.  HENT: Negative for congestion and tinnitus.   Eyes: Negative for blurred vision and double vision.  Respiratory: Negative for cough, shortness of breath and wheezing.   Cardiovascular: Negative for chest pain, orthopnea and PND.  Gastrointestinal: Negative for abdominal pain, diarrhea, nausea and vomiting.  Genitourinary: Negative for dysuria and hematuria.  Musculoskeletal: Positive for joint pain (Left leg pain. ).  Neurological: Negative for dizziness, sensory change and focal weakness.  All other systems reviewed and are negative.   Nutrition: Heart healthy Tolerating Diet: Yes Tolerating PT: Await Eval.   DRUG ALLERGIES:   Allergies  Allergen Reactions  . Lodine [Etodolac] Hives and Itching  . Methotrexate Derivatives Other (See Comments)    Flu like symptoms  . Nitrofuran Derivatives Nausea And Vomiting and Other (See Comments)    Made sicker   . Levaquin [Levofloxacin] Itching, Rash and Other (See Comments)    Muscle pain  . Norvasc [Amlodipine] Other (See Comments)    Unknown   . Pentasa [Mesalamine Er] Other (See Comments)    Unknown   . Sulfa Antibiotics Itching and Rash  . Vioxx [Rofecoxib] Other (See Comments)    Unknown     VITALS:  Blood pressure 107/64, pulse 80, temperature 99.4 F (37.4 C), temperature source Oral, resp. rate 19, height 5' 4"  (1.626 m), weight 53.5 kg (118 lb), SpO2 92 %.  PHYSICAL EXAMINATION:   Physical Exam  GENERAL:  77 y.o.-year-old patient lying in bed in no acute distress.  EYES: Pupils equal, round,  reactive to light and accommodation. No scleral icterus. Extraocular muscles intact.  HEENT: Head atraumatic, normocephalic. Oropharynx and nasopharynx clear.  NECK:  Supple, no jugular venous distention. No thyroid enlargement, no tenderness.  LUNGS: Normal breath sounds bilaterally, no wheezing, rales, rhonchi. No use of accessory muscles of respiration.  CARDIOVASCULAR: S1, S2 normal. No murmurs, rubs, or gallops.  ABDOMEN: Soft, nontender, nondistended. Bowel sounds present. No organomegaly or mass.  EXTREMITIES: No cyanosis, clubbing or edema b/l.   Left BKA with dressing in place. No bleeding noted.  NEUROLOGIC: Cranial nerves II through XII are intact. No focal Motor or sensory deficits b/l.   PSYCHIATRIC: The patient is alert and oriented x 3.  SKIN: No obvious rash, lesion, or ulcer.    LABORATORY PANEL:   CBC Recent Labs  Lab 02/09/18 0535  WBC 15.8*  HGB 8.9*  HCT 26.8*  PLT 285   ------------------------------------------------------------------------------------------------------------------  Chemistries  Recent Labs  Lab 02/09/18 0535  NA 138  K 3.9  CL 103  CO2 29  GLUCOSE 195*  BUN 13  CREATININE 0.53  CALCIUM 8.3*   ------------------------------------------------------------------------------------------------------------------  Cardiac Enzymes No results for input(s): TROPONINI in the last 168 hours. ------------------------------------------------------------------------------------------------------------------  RADIOLOGY:  Dg C-arm 1-60 Min-no Report  Result Date: 02/08/2018 Fluoroscopy was utilized by the requesting physician.  No radiographic interpretation.     ASSESSMENT AND PLAN:   77 yo female with past medical history of rheumatoid arthritis, osteoarthritis, psoriasis, GERD, coronary artery disease, COPD, peripheral vascular disease, anxiety who presents to  the hospital due to an ulcer on her previous left stump of her previous BKA and  underwent debridement of the left BKA stump.  1.  Status post debridement of the left BKA stump-postop day #1 today. -Patient having significant pain at the surgical site and continue pain control and further care as per vascular surgery. -Continue oxycodone, PRN Dilaudid for pain control.  2.  History of rheumatoid arthritis-continue Plaquenil, leflunomide. -Continue maintenance prednisone.  3.  Essential hypertension- continue lisinopril, Cardizem.  4.  GERD-continue Protonix.  5.  Hyperlipidemia-continue Pravachol.  6. Neuropathy - cont. Neurontin.   May need SNF/STR upon discharge.   All the records are reviewed and case discussed with Care Management/Social Worker. Management plans discussed with the patient, family and they are in agreement.  CODE STATUS: Full code  DVT Prophylaxis: Will resume Eliquis likely tomorrow after discussion with vascular surgery.   TOTAL TIME TAKING CARE OF THIS PATIENT: 30 minutes.   POSSIBLE D/C IN 2-3 DAYS, DEPENDING ON CLINICAL CONDITION.   Henreitta Leber M.D on 02/09/2018 at 1:47 PM  Between 7am to 6pm - Pager - [phone]  After 6pm go to [email or web address] - Proofreader  [facility]  Office  [phone]  CC: Primary care physician; [name], MD

## 2018-02-09 NOTE — Evaluation (Signed)
Physical Therapy Evaluation Patient Details Name: Melissa [name] MRN: [id number] DOB: 1940-08-10 Today's Date: 02/09/2018   History of Present Illness  patient is a 77 year old female admitted s/p debridement of gangrenous residual limb following L BKA.  PMH includes RA, PVD, CAD, HLD, r great toe ulcer, MI, Raynauds and ataxia.  Clinical Impression  Patient is a 77 year old female who lives in a one story home with her husband.  She has recently discharged from SNF and is independent with ADL's and transfers from bed to [facility] at baseline.  She is in bed upon PT arrival and able to perform bed mobility with min A.  Pt is able to sit at EOB and demonstrate fair strength of R LE as well as report of good sensation.  Pt presents with general weakness of UE's.  She is able to perform a squat pivot transfer from bed to chair with min A for chair setup and for fall prevention.  Pt reports "stinging" pain in L LE and states that she has experienced phantom limb pains.  PT provided education concerning therapy available for phantom limb pain as well as for positioning for L LE.  PT assisted pt in positioning of pillow beneath L LE and pt requested to sit up in recliner at end of session.  Pt will continue to benefit from skilled PT with focus on strength, safe functional mobility and use of AD, transfers and pain management.    Follow Up Recommendations SNF    Equipment Recommendations  None recommended by PT    Recommendations for Other Services       Precautions / Restrictions Precautions Precautions: Fall Restrictions Weight Bearing Restrictions: No      Mobility  Bed Mobility Overal bed mobility: Needs Assistance Bed Mobility: Supine to Sit     Supine to sit: Min assist     General bed mobility comments: Very minimal assistance to bring R LE over EOB.  Pt able to scoot to EOB and sit without physical assist.  Transfers Overall transfer level: Needs assistance   Transfers: Squat Pivot  Transfers     Squat pivot transfers: Min assist     General transfer comment: PT provided chair setup and very close contact guard.  Pt able to WB on R LE to assist in transfer.  Able to execute good hand placement and body mechanics.  Ambulation/Gait                Stairs            Wheelchair Mobility    Modified Rankin (Stroke Patients Only)       Balance Overall balance assessment: Needs assistance Sitting-balance support: Bilateral upper extremity supported Sitting balance-Leahy Scale: Good     Standing balance support: Bilateral upper extremity supported Standing balance-Leahy Scale: Poor                               Pertinent Vitals/Pain Pain Assessment: Faces Faces Pain Scale: Hurts little more Pain Location: No pain at rest.  Pt reports "stinging" pain at end of transfer and states that she has experienced frequent phantom pains. Pain Intervention(s): Monitored during session;Patient requesting pain meds-RN notified    Home Living Family/patient expects to be discharged to:: Private residence Living Arrangements: Spouse/significant other Available Help at Discharge: Family;Available 24 hours/day;Available PRN/intermittently(Husband is not physically able to assist patient, family is at work during the day.) Type of  Home: House Home Access: [place]: One [place]: Grab bars - toilet;Toilet riser      Prior Function Level of Independence: Independent with assistive device(s)         Comments: Transfers from bed to WC independently,      Hand Dominance        Extremity/Trunk Assessment   Upper Extremity Assessment Upper Extremity Assessment: Generalized weakness    Lower Extremity Assessment Lower Extremity Assessment: Generalized weakness(R LE grossly 4-/5, no sensation loss reported.)    Cervical / Trunk Assessment Cervical / Trunk Assessment: Normal  Communication    Communication: No difficulties  Cognition Arousal/Alertness: Lethargic Behavior During Therapy: WFL for tasks assessed/performed Overall Cognitive Status: Within Functional Limits for tasks assessed                                 General Comments: A&O x4.  Follows commands consistently.      General Comments      Exercises Other Exercises Other Exercises: Educated pt regarding rehabilitation timeline and discussed mirror therapy for phantom limb pain. x5 min Other Exercises: Assisted pt in positioning and educated concerning placement of pillow beneath residual limb. x 28mn   Assessment/Plan    PT Assessment Patient needs continued PT services  PT Problem List Decreased strength;Decreased balance;Decreased knowledge of use of DME;Decreased activity tolerance;Decreased mobility       PT Treatment Interventions DME instruction;Therapeutic activities;Gait training;Therapeutic exercise;Patient/family education;Stair training;Balance training;Functional mobility training;Neuromuscular re-education    PT Goals (Current goals can be found in the Care Plan section)  Acute Rehab PT Goals Patient Stated Goal: to participate in therapy and continue to get stronger. PT Goal Formulation: With patient Time For Goal Achievement: 02/23/18 Potential to Achieve Goals: Good    Frequency Min 2X/week   Barriers to discharge        Co-evaluation               AM-PAC PT "6 Clicks" Daily Activity  Outcome Measure Difficulty turning over in bed (including adjusting bedclothes, sheets and blankets)?: A Little Difficulty moving from lying on back to sitting on the side of the bed? : A Little Difficulty sitting down on and standing up from a chair with arms (e.g., wheelchair, bedside commode, etc,.)?: A Little Help needed moving to and from a bed to chair (including a wheelchair)?: A Little Help needed walking in hospital room?: A Lot Help needed climbing 3-5 steps with a  railing? : Total 6 Click Score: 15    End of Session Equipment Utilized During Treatment: Gait belt Activity Tolerance: Patient tolerated treatment well Patient left: in chair;with call bell/phone within reach;with chair alarm set;with family/visitor present Nurse Communication: Mobility status;Patient requests pain meds PT Visit Diagnosis: Unsteadiness on feet (R26.81);Muscle weakness (generalized) (M62.81)    Time: [address]383PT Time Calculation (min) (ACUTE ONLY): 31 min   Charges:   PT Evaluation $PT Eval Moderate Complexity: 1 Mod PT Treatments $Therapeutic Activity: 8-22 mins        SRoxanne Gates PT, DPT  SRoxanne Gates PT, DPT 02/09/2018, 3:08 PM

## 2018-02-09 NOTE — NC FL2 (Signed)
[place] LEVEL OF CARE SCREENING TOOL     IDENTIFICATION  Patient Name: Melissa [name] Birthdate: 1941-04-09 Sex: female Admission Date (Current Location): 02/08/2018  [place] and [place] Number:  Engineering geologist and Address:  [facility], [address] [place], [place] [address]      Provider Number: [id number]  Attending Physician Name and Address:  Katha Cabal, MD  Relative Name and Phone Number:  Vannary Greening- husband [phone]    Current Level of Care: Hospital Recommended Level of Care: [place] Prior Approval Number:    Date Approved/Denied:   PASRR Number:   [id number]  Discharge Plan: SNF    Current Diagnoses: Patient Active Problem List   Diagnosis Date Noted  . Complication of below knee amputation stump ([place]) 02/04/2018  . Pressure ulcer of BKA stump, unstageable ([place]) 01/14/2018  . 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 Atherosclerosis of native arteries of the extremities with ulceration ([place]) 11/18/2017  . Essential hypertension 11/18/2017  . Pure hypercholesterolemia 11/18/2017  . GERD (gastroesophageal reflux disease) 11/18/2017  . Colitis 04/19/2017  . UTI due to extended-spectrum beta lactamase (ESBL) producing Escherichia coli 04/19/2017  . Other secondary osteoarthritis of one knee 04/04/2016    Orientation RESPIRATION BLADDER Height & Weight     Self, Place  Normal Continent Weight: 118 lb (53.5 kg) Height:  5' 4"  (162.6 cm)  BEHAVIORAL SYMPTOMS/MOOD NEUROLOGICAL BOWEL NUTRITION STATUS  (none) (none) Continent Diet(Heart Healthy )  AMBULATORY STATUS COMMUNICATION OF NEEDS Skin   Extensive Assist Verbally Surgical wounds(Left Knee incision )                       Personal Care Assistance Level of Assistance  Bathing, Feeding, Dressing Bathing Assistance: Limited assistance Feeding assistance: Independent Dressing Assistance: Limited assistance     Functional  Limitations Info  Sight, Hearing, Speech Sight Info: Adequate Hearing Info: Adequate Speech Info: Adequate    SPECIAL CARE FACTORS FREQUENCY  OT (By licensed OT), PT (By licensed PT)     PT Frequency: 5 OT Frequency: 5            Contractures Contractures Info: Not present    Additional Factors Info  Code Status, Allergies Code Status Info: Full Code  Allergies Info: Lodine Etodolac, Methotrexate Derivatives, Nitrofuran Derivatives, Levaquin Levofloxacin, Norvasc Amlodipine, Pentasa Mesalamine Er, Sulfa Antibiotics, Vioxx Rofecoxib           Current Medications (02/09/2018):  This is the current hospital active medication list Current Facility-Administered Medications  Medication Dose Route Frequency Provider Last Rate Last Dose  . aspirin EC tablet 81 mg  81 mg Oral Daily Loletha Grayer, MD   81 mg at 02/09/18 0848  . [START ON 02/10/2018] budesonide (ENTOCORT EC) 24 hr capsule 3 mg  3 mg Oral QODAY Wieting, Richard, MD      . budesonide (ENTOCORT EC) 24 hr capsule 6 mg  6 mg Oral Sabino Donovan, MD   6 mg at 02/09/18 0850  . calcium-vitamin D (OSCAL WITH D) 500-200 MG-UNIT per tablet 1 tablet  1 tablet Oral BID Loletha Grayer, MD   1 tablet at 02/09/18 0848  . Chlorhexidine Gluconate Cloth 2 % PADS 6 each  6 each Topical Q0600 Loletha Grayer, MD   6 each at 02/09/18 0615  . dextrose 5 %-0.9 % sodium chloride infusion   Intravenous Continuous Loletha Grayer, MD 75 mL/hr at 02/09/18 1301    . diltiazem (CARDIZEM  CD) 24 hr capsule 240 mg  240 mg Oral Daily Wieting, Richard, MD      . docusate sodium (COLACE) capsule 100 mg  100 mg Oral BID Loletha Grayer, MD   100 mg at 02/09/18 0848  . ferrous sulfate tablet 325 mg  325 mg Oral Q breakfast Loletha Grayer, MD   325 mg at 02/09/18 0731  . gabapentin (NEURONTIN) capsule 300 mg  300 mg Oral QHS Loletha Grayer, MD   300 mg at 02/08/18 2206  . gemfibrozil (LOPID) tablet 600 mg  600 mg Oral BID AC Loletha Grayer, MD   600 mg at 02/09/18 0731  . HYDROmorphone (DILAUDID) injection 0.5 mg  0.5 mg Intravenous Q3H PRN Loletha Grayer, MD   0.5 mg at 02/09/18 0845  . hydroxychloroquine (PLAQUENIL) tablet 200 mg  200 mg Oral BID Loletha Grayer, MD   200 mg at 02/09/18 0850  . leflunomide (ARAVA) tablet 20 mg  20 mg Oral Daily Loletha Grayer, MD   20 mg at 02/09/18 0849  . lisinopril (PRINIVIL,ZESTRIL) tablet 10 mg  10 mg Oral Daily Wieting, Richard, MD      . LORazepam (ATIVAN) tablet 1 mg  1 mg Oral BID Loletha Grayer, MD   1 mg at 02/09/18 0849  . magnesium hydroxide (MILK OF MAGNESIA) suspension 30 mL  30 mL Oral Daily PRN Wieting, Richard, MD      . Melatonin TABS 2.5 mg  2.5 mg Oral QHS Wieting, Richard, MD      . multivitamin with minerals tablet 1 tablet  1 tablet Oral Daily Loletha Grayer, MD   1 tablet at 02/09/18 0849  . multivitamin-lutein (OCUVITE-LUTEIN) capsule   Oral BID Loletha Grayer, MD   1 capsule at 02/09/18 0850  . mupirocin ointment (BACTROBAN) 2 % 1 application  1 application Nasal BID Loletha Grayer, MD   1 application at 83/33/83 [phone]  . ondansetron (ZOFRAN) tablet 4 mg  4 mg Oral Q6H PRN Loletha Grayer, MD       Or  . ondansetron [facility]) injection 4 mg  4 mg Intravenous Q6H PRN Loletha Grayer, MD   4 mg at 02/09/18 0134  . oxyCODONE (Oxy IR/ROXICODONE) immediate release tablet 5 mg  5 mg Oral Q4H PRN Loletha Grayer, MD   5 mg at 02/09/18 1501  . pantoprazole (PROTONIX) EC tablet 40 mg  40 mg Oral Daily Loletha Grayer, MD   40 mg at 02/09/18 0849  . pravastatin (PRAVACHOL) tablet 80 mg  80 mg Oral QHS Loletha Grayer, MD   80 mg at 02/08/18 2203  . predniSONE (DELTASONE) tablet 5 mg  5 mg Oral Q breakfast Loletha Grayer, MD   5 mg at 02/09/18 0731  . sertraline (ZOLOFT) tablet 100 mg  100 mg Oral QHS Loletha Grayer, MD   100 mg at 02/08/18 2204  . sodium phosphate (FLEET) 7-19 GM/118ML enema 1 enema  1 enema Rectal Once PRN Wieting, Richard, MD      .  sorbitol 70 % solution 30 mL  30 mL Oral Daily PRN Loletha Grayer, MD         Discharge Medications: Please see discharge summary for a list of discharge medications.  Relevant Imaging Results:  Relevant Lab Results:   Additional Information SSN [id number]  Annamaria Boots, [place]

## 2018-02-09 NOTE — Clinical Social Work Note (Signed)
Clinical Social Work Assessment  Patient Details  Name: Melissa [name] MRN: [id number] Date of Birth: 1940-07-19  Date of referral:  02/09/18               Reason for consult:  Facility Placement                Permission sought to share information with:  Case Manager, Chartered certified accountant granted to share information::  Yes, Verbal Permission Granted  Name::      SNF  Agency::   [facility]   Relationship::     Contact Information:     Housing/Transportation Living arrangements for the past 2 months:  Single Family Home Source of Information:  Spouse Patient Interpreter Needed:  None Criminal Activity/Legal Involvement Pertinent to Current Situation/Hospitalization:  No - Comment as needed Significant Relationships:  Adult Children, Spouse Lives with:  Spouse Do you feel safe going back to the place where you live?  Yes Need for family participation in patient care:  Yes (Comment)  Care giving concerns:  Patient lives with her husband in [place]     Social Worker assessment / plan: CSW consulted for facility placement. CSW met with patient's husband Dea Bitting at bedside. Patient slept through assessment. CSW introduced self and explained role. Husband states that patient lives with him and he assists her as he can. Per husband patient has been to [facility] in the past. CSW explained that therapy has recommended SNF for rehab. Husband is in agreement and gave permission for bed search. CSW will initiate bed search and give offers once received. CSW will follow for discharge planning.   Employment status:  Retired Nurse, adult PT Recommendations:  [place] / Referral to community resources:  [place]  Patient/Family's Response to care:  Patient's husband thanked CSW for assistance   Patient/Family's Understanding of and Emotional Response to Diagnosis, Current  Treatment, and Prognosis:  Husband is in agreement with discharge plan   Emotional Assessment Appearance:  Appears stated age Attitude/Demeanor/Rapport:  Unresponsive, Lethargic Affect (typically observed):    Orientation:  Oriented to Self Alcohol / Substance use:  Not Applicable Psych involvement (Current and /or in the community):  No (Comment)  Discharge Needs  Concerns to be addressed:  Discharge Planning Concerns Readmission within the last 30 days:  No Current discharge risk:  Dependent with Mobility Barriers to Discharge:  Continued Medical Work up   [employer], [place] 02/09/2018, 4:13 PM

## 2018-02-09 NOTE — Progress Notes (Signed)
Vascular  Doing OK. Occasional pain in stump- well controlled with current regimen. Otherwise without complaint. Discussed the need for Rehab placement- patient agrees.  Vitals:   02/08/18 1940 02/09/18 0525  BP: (!) 130/55 (!) 111/57  Pulse: 92 89  Resp: 20 16  Temp: 99.6 F (37.6 C) 99 F (37.2 C)  SpO2: 96% 92%   WBC: 15  LEFT stump; dressing in place- clean  S/p Left BKA stump revision POD #1 OOB with PT Rehab planning Pain control Encourage PO

## 2018-02-10 ENCOUNTER — Encounter: Payer: Self-pay | Admitting: Vascular Surgery

## 2018-02-10 MED ORDER — APIXABAN 5 MG PO TABS
5.0000 mg | ORAL_TABLET | Freq: Two times a day (BID) | ORAL | Status: DC
  Administered 2018-02-10 – 2018-02-13 (×6): 5 mg via ORAL
  Filled 2018-02-10 (×7): qty 1

## 2018-02-10 MED ORDER — ACETAMINOPHEN 325 MG PO TABS
650.0000 mg | ORAL_TABLET | Freq: Four times a day (QID) | ORAL | Status: DC | PRN
  Administered 2018-02-10: 650 mg via ORAL
  Filled 2018-02-10: qty 2

## 2018-02-10 MED ORDER — ACETAMINOPHEN 325 MG PO TABS: 650.0000 mg | ORAL_TABLET | Freq: Four times a day (QID) | ORAL | Status: DC | PRN

## 2018-02-10 MED ORDER — LORAZEPAM 0.5 MG PO TABS
0.5000 mg | ORAL_TABLET | Freq: Every day | ORAL | Status: DC
  Administered 2018-02-11 – 2018-02-12 (×2): 0.5 mg via ORAL
  Filled 2018-02-10 (×2): qty 1

## 2018-02-10 NOTE — Progress Notes (Addendum)
Pt complaining of pain after RN place knee immobilizer. Pt requested to be taken off.

## 2018-02-10 NOTE — Progress Notes (Signed)
Vascular Surgery  Doing OK. Still has pain- mostly at night. Sleepy on exam.  Vitals:   02/09/18 2005 02/10/18 0430  BP: (!) 114/59 93/61  Pulse: 95 87  Resp: 16 16  Temp: 98.9 F (37.2 C) 99.3 F (37.4 C)  SpO2: 97% 93%    Sleepy but arousable LEFT BKA stump dressing- C/D/I  S/p Left BKA stump revision POD #2 OOB with PT Rehab planning Pain control Encourage PO Will change dressing tomorrow Change ativan to pm dosing only Knee immobilizer

## 2018-02-10 NOTE — Progress Notes (Signed)
[place] [facility] [place] NAME: Melissa [name]    MR#:  [id number]  DATE OF BIRTH:  Jan 07, 1941  SUBJECTIVE:   Patient is status post left-sided BKA postop day #2 today.  Pain has improved. Pt is a bit lethargic probably due to the Ativan she received.  Patient's daughter is at bedside.  REVIEW OF SYSTEMS:    Review of Systems  Constitutional: Negative for chills and fever.  HENT: Negative for congestion and tinnitus.   Eyes: Negative for blurred vision and double vision.  Respiratory: Negative for cough, shortness of breath and wheezing.   Cardiovascular: Negative for chest pain, orthopnea and PND.  Gastrointestinal: Negative for abdominal pain, diarrhea, nausea and vomiting.  Genitourinary: Negative for dysuria and hematuria.  Musculoskeletal: Positive for joint pain (Left leg pain. ).  Neurological: Negative for dizziness, sensory change and focal weakness.  All other systems reviewed and are negative.   Nutrition: Heart healthy Tolerating Diet: Yes Tolerating PT: Await Eval.   DRUG ALLERGIES:   Allergies  Allergen Reactions  . Lodine [Etodolac] Hives and Itching  . Methotrexate Derivatives Other (See Comments)    Flu like symptoms  . Nitrofuran Derivatives Nausea And Vomiting and Other (See Comments)    Made sicker   . Levaquin [Levofloxacin] Itching, Rash and Other (See Comments)    Muscle pain  . Norvasc [Amlodipine] Other (See Comments)    Unknown   . Pentasa [Mesalamine Er] Other (See Comments)    Unknown   . Sulfa Antibiotics Itching and Rash  . Vioxx [Rofecoxib] Other (See Comments)    Unknown     VITALS:  Blood pressure (!) 113/53, pulse 83, temperature 98.9 F (37.2 C), temperature source Oral, resp. rate 17, height 5' 4"  (1.626 m), weight 52 kg (114 lb 10.2 oz), SpO2 96 %.  PHYSICAL EXAMINATION:   Physical Exam  GENERAL:  77 y.o.-year-old patient lying in bed in no acute distress.  EYES: Pupils equal, round,  reactive to light and accommodation. No scleral icterus. Extraocular muscles intact.  HEENT: Head atraumatic, normocephalic. Oropharynx and nasopharynx clear.  NECK:  Supple, no jugular venous distention. No thyroid enlargement, no tenderness.  LUNGS: Normal breath sounds bilaterally, no wheezing, rales, rhonchi. No use of accessory muscles of respiration.  CARDIOVASCULAR: S1, S2 normal. No murmurs, rubs, or gallops.  ABDOMEN: Soft, nontender, nondistended. Bowel sounds present. No organomegaly or mass.  EXTREMITIES: No cyanosis, clubbing or edema b/l.  Left BKA with dressing in place. No bleeding noted.  NEUROLOGIC: Cranial nerves II through XII are intact. No focal Motor or sensory deficits b/l.   PSYCHIATRIC: The patient is alert and oriented x 3.  SKIN: No obvious rash, lesion, or ulcer.    LABORATORY PANEL:   CBC Recent Labs  Lab 02/09/18 0535  WBC 15.8*  HGB 8.9*  HCT 26.8*  PLT 285   ------------------------------------------------------------------------------------------------------------------  Chemistries  Recent Labs  Lab 02/09/18 0535  NA 138  K 3.9  CL 103  CO2 29  GLUCOSE 195*  BUN 13  CREATININE 0.53  CALCIUM 8.3*   ------------------------------------------------------------------------------------------------------------------  Cardiac Enzymes No results for input(s): TROPONINI in the last 168 hours. ------------------------------------------------------------------------------------------------------------------  RADIOLOGY:  No results found.   ASSESSMENT AND PLAN:   77 yo female with past medical history of rheumatoid arthritis, osteoarthritis, psoriasis, GERD, coronary artery disease, COPD, peripheral vascular disease, anxiety who presents to the hospital due to an ulcer on her previous left stump of her previous BKA and underwent  debridement of the left BKA stump.  1.  Status post debridement of the left BKA stump-postop day #2 today. - pain  is better controlled on oral Oxycodone.   2.  History of rheumatoid arthritis-continue Plaquenil, leflunomide. -Continue maintenance prednisone.  3.  Essential hypertension- continue lisinopril, Cardizem.  4.  GERD-continue Protonix.  5.  Hyperlipidemia-continue Pravachol.  6. Neuropathy - cont. Neurontin.   Seen by PT and they recommend SNF/STR and family would prefer [facility].   All the records are reviewed and case discussed with Care Management/Social Worker. Management plans discussed with the patient, family and they are in agreement.  CODE STATUS: Full code  DVT Prophylaxis: Eliquis  TOTAL TIME TAKING CARE OF THIS PATIENT: 30 minutes.   POSSIBLE D/C IN 2-3 DAYS, DEPENDING ON CLINICAL CONDITION.   Henreitta Leber M.D on 02/10/2018 at 1:54 PM  Between 7am to 6pm - Pager - [phone]  After 6pm go to [email or web address] - Proofreader  [facility] [place] [facility]  Office  [phone]  CC: Primary care physician; [name], MD

## 2018-02-11 MED ORDER — ENSURE ENLIVE PO LIQD: 237.0000 mL | Freq: Two times a day (BID) | ORAL | 12 refills | Status: AC

## 2018-02-11 MED ORDER — OXYCODONE HCL 5 MG PO TABS: 5.0000 mg | ORAL_TABLET | Freq: Four times a day (QID) | ORAL | 0 refills | Status: DC | PRN

## 2018-02-11 MED ORDER — TRAMADOL HCL 50 MG PO TABS: 50.0000 mg | ORAL_TABLET | Freq: Four times a day (QID) | ORAL | 2 refills | Status: DC | PRN

## 2018-02-11 MED ORDER — ENSURE ENLIVE PO LIQD
237.0000 mL | Freq: Two times a day (BID) | ORAL | Status: DC
  Administered 2018-02-12 – 2018-02-13 (×2): 237 mL via ORAL

## 2018-02-11 NOTE — Clinical Social Work Note (Signed)
[facility] has stated that after the daughter came and spoke with their administrator and their DON, that they will now be able to take patient. They are beginning authorization. Shela Leff MSW,LCSW [phone]

## 2018-02-11 NOTE — Clinical Social Work Note (Signed)
CSW called [facility] due to knowing that this was patient's/family's preference. [facility] stated that they are not able to take patient as she was there before and the daughter allegedly called the state on the facility. CSW has not passed this on to patient's daughter as to why they cannot take patient. Patient had 2 local bed offers: [facility] and [facility]. Patient's daughter stated she would want to tour the facilities before making a decision. CSW has encouraged her to do this as soon as possible due to patient's insurance being rather slow with providing authorization. Shela Leff MSW,LCSW [phone]

## 2018-02-11 NOTE — Progress Notes (Signed)
[facility]  Daily Progress Note   Subjective: 3 Days Post-Op: Revision left below-the-knee amputation  Patient with some left stump pain.  Family at bedside.  Objective: Vitals:   02/11/18 0359 02/11/18 0922 02/11/18 0923 02/11/18 1100  BP: 140/68 (!) 96/52 (!) 98/50 (!) 100/54  Pulse: 87   82  Resp: 18   17  Temp: 97.6 F (36.4 C)   97.8 F (36.6 C)  TempSrc: Oral   Oral  SpO2: 98%   98%  Weight: 109 lb 9.1 oz (49.7 kg)     Height:       No intake or output data in the 24 hours ending 02/11/18 1847  Physical Exam: A&Ox3, NAD CV: RRR Pulmonary: CTA Bilaterally Abdomen: Soft, Nontender, Nondistended Vascular:  Left lower extremity: Or dressing removed.  Incision: Staples are intact.  Clean and dry.   Stump is healthy.   Laboratory: CBC    Component Value Date/Time   WBC 15.8 (H) 02/09/2018 0535   HGB 8.9 (L) 02/09/2018 0535   HGB 11.6 (L) 06/16/2013 1031   HCT 26.8 (L) 02/09/2018 0535   HCT 34.8 (L) 06/16/2013 1031   PLT 285 02/09/2018 0535   PLT 226 06/16/2013 1031   BMET    Component Value Date/Time   NA 138 02/09/2018 0535   NA 139 06/16/2013 1031   K 3.9 02/09/2018 0535   K 3.8 06/16/2013 1031   CL 103 02/09/2018 0535   CL 105 06/16/2013 1031   CO2 29 02/09/2018 0535   CO2 30 06/16/2013 1031   GLUCOSE 195 (H) 02/09/2018 0535   GLUCOSE 96 06/16/2013 1031   BUN 13 02/09/2018 0535   BUN 23 (H) 06/16/2013 1031   CREATININE 0.53 02/09/2018 0535   CREATININE 0.58 (L) 06/16/2013 1031   CALCIUM 8.3 (L) 02/09/2018 0535   CALCIUM 9.7 06/16/2013 1031   GFRNONAA >60 02/09/2018 0535   GFRNONAA >60 06/16/2013 1031   GFRAA >60 02/09/2018 0535   GFRAA >60 06/16/2013 1031   Assessment/Planning: 77 year old female postop day #3 left below the knee amputation revision - Stable 1) she is doing well postoperatively 2) patient has become very lethargic when given narcotic pain medication  3) OR dressing removed today.  Stump is healing  well.  Incision is clean dry and intact. 4) As per vascular surgery.  The patient can be discharged to SNF when a bed is available.  Marcelle Overlie PA-C 02/11/2018 6:47 PM

## 2018-02-11 NOTE — Progress Notes (Signed)
PT Cancellation Note  Patient Details Name: BRINLEE GAMBRELL MRN: [id number] DOB: 08-28-1940   Cancelled Treatment:     Treatment deferred due to patient sleepiness/lethargy. Will re-attempt to treat on next medically appropriate date.    Hortencia Conradi, SPT 02/11/18,4:12 PM

## 2018-02-11 NOTE — Progress Notes (Signed)
Initial Nutrition Assessment  DOCUMENTATION CODES:   Not applicable  INTERVENTION:   Ensure Enlive po BID, each supplement provides 350 kcal and 20 grams of protein  Magic cup TID with meals, each supplement provides 290 kcal and 9 grams of protein  Ocuvite daily for wound healing (provides zinc, vitamin A, vitamin C, Vitamin E, copper, and selenium)  Recommend liberal diet   NUTRITION DIAGNOSIS:   Increased nutrient needs related to wound healing as evidenced by increased estimated needs.  GOAL:   Patient will meet greater than or equal to 90% of their needs  MONITOR:   PO intake, Supplement acceptance, Labs, Weight trends, Skin, I & O's  REASON FOR ASSESSMENT:   Diagnosis, Other (Comment)(low BMI)    ASSESSMENT:   77 yo female with past medical history of rheumatoid arthritis, osteoarthritis, psoriasis, GERD, coronary artery disease, COPD, peripheral vascular disease, anxiety who presents to the hospital due to an ulcer on her previous left stump of her previous BKA and underwent debridement of the left BKA stump 7/26   Met with pt in room today. Pt reports good appetite and oral intake pta and today. Pt reports eating 50-100% of meals in hospital. Pt does like Ensure but does not drink any supplements regularly at home. Per chart, pt has lost 13lbs(11%) over the past 2 months; it is difficult to determine if pt has had any true weight loss as she had a BKA during that time. RD will order supplements to help pt meet her estimated needs and to encourage wound healing. Pt would like to have orange Magic Cups and chocolate Ensure.   Medications reviewed and include: oscal w/ D, colace, ferrous sulfate, melatonin, MVI, ocuvite, protonix, prednisone, oxycodone   Labs reviewed:   NUTRITION - FOCUSED PHYSICAL EXAM:    Most Recent Value  Orbital Region  No depletion  Upper Arm Region  No depletion  Thoracic and Lumbar Region  No depletion  Buccal Region  No depletion   Temple Region  No depletion  Clavicle Bone Region  Mild depletion  Clavicle and Acromion Bone Region  Mild depletion  Scapular Bone Region  No depletion  Dorsal Hand  Severe depletion  Patellar Region  Severe depletion  Anterior Thigh Region  Severe depletion  Posterior Calf Region  Severe depletion  Edema (RD Assessment)  None  Hair  Reviewed  Eyes  Reviewed  Mouth  Reviewed  Skin  Reviewed  Nails  Reviewed     Diet Order:   Diet Order           Diet Heart Room service appropriate? Yes; Fluid consistency: Thin  Diet effective now         EDUCATION NEEDS:   Education needs have been addressed  Skin:  Skin Assessment: Reviewed RN Assessment(incision L knee s/p BKA)  Last BM:  7/29- type 3  Height:   Ht Readings from Last 1 Encounters:  02/08/18 _0  (1.626 m)    Weight:   Wt Readings from Last 1 Encounters:  02/11/18 109 lb 9.1 oz (49.7 kg)    Ideal Body Weight:  51.3 kg(adjusted for BKA)  BMI:  Body mass index is 18.81 kg/m.  Estimated Nutritional Needs:   Kcal:  1300-1500kcal/day   Protein:  64-74g/day   Fluid:  >1.3L/day   Koleen Distance MS, RD, LDN Pager #- [phone] Office#- [phone] After Hours Pager: [phone]

## 2018-02-11 NOTE — Discharge Instructions (Signed)
Please dress stump with dry kerlix and ace bandage daily. Patient may shower.  Keep incision clean and dry. Please encourage patient left leg as straight as possible to avoid contracture. Please encourage patient to elevate left leg if she experiences any swelling.

## 2018-02-11 NOTE — Discharge Summary (Addendum)
[place]    Discharge Summary  Patient ID:  Melissa [name] MRN: [id number] DOB/AGE: Feb 07, 1941 77 y.o.  Admit date: 02/08/2018 Discharge date: 02/12/2018 Date of Surgery: 02/08/2018 Surgeon: Surgeon(s): Schnier, Dolores Lory, MD  Admission Diagnosis: COMPLICATIONS OF AMPUTATION STUMP  Discharge Diagnoses:  COMPLICATIONS OF AMPUTATION STUMP  Secondary Diagnoses: Past Medical History:  Diagnosis Date  . Anemia 12/2017   blood transfusion after amputation  . Anxiety   . Arthritis    rheumatoid  . Ataxia   . Chronic ulcer of right great toe ([place]) 2019   slowly healing  . COPD (chronic obstructive pulmonary disease) ([place])   . Coronary artery disease 2018   complete blockage in one vessel, 40 % in another, 25% in 3rd vessell  . Depression   . Diverticulosis   . Dysrhythmia    atrial fib  . Excessive falling   . GERD (gastroesophageal reflux disease)    ulcerative colitis  . Hypercholesteremia   . Hyperlipidemia   . Hypertension   . IBS (irritable bowel syndrome)   . MRSA (methicillin resistant staph aureus) culture positive 11/2017   patient unaware of this diagnosis, was never informed or treated  . Myocardial infarction ([place]) 08/2017   had angio but no stents  . Osteoarthritis   . Psoriasis   . Raynaud's disease without gangrene   . Raynaud's disease without gangrene   . Shortness of breath dyspnea    with exertion  . Transfusion history 12/2017   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)   Procedure(s): AMPUTATION BELOW KNEE ( REVISION )  Discharged Condition: stable  HPI:  The patient is a 77 year old female who is post a left below the knee amputation due to severe peripheral artery disease.  The patient had fallen on her stump twice and presented to clinic with the entire cut and of the tibia is exposed.  On February 08, 2018, the patient underwent a revision left below the knee amputation.  The patient tolerated the procedure well was  transferred to the surgical floor without complication.  During her inpatient stay, the patient received physical therapy services which recommend discharge to SNF. The patient's Foley was removed and her diet was advanced without issue.  The patient's pain was controlled with PO narcotic pain medication which can make her lethargic.  The patient's brief inpatient stay was unremarkable and without complication.  Hospital Course:  Melissa [name] is a 77 y.o. female is S/P Left:  Procedure(s): AMPUTATION BELOW KNEE (REVISION)  Extubated: POD # 0  Physical exam:  A&Ox3, NAD CV: RRR Pulmonary: CTA Bilaterally Abdomen: Soft, Nontender, Nondistended Vascular:             Left lower extremity: Or dressing removed.  Incision: Staples are intact.  Clean and dry.           Stump is healthy.  Post-op wounds clean, dry, intact or healing well  Pt. Ambulating, voiding and taking PO diet without difficulty.  Pain controlled with PO pain meds.  Labs as below  Complications:none  Consults:  Treatment Team:  Loletha Grayer, MD  Significant Diagnostic Studies: CBC Lab Results  Component Value Date   WBC 9.4 02/12/2018   HGB 7.6 (L) 02/12/2018   HCT 22.3 (L) 02/12/2018   MCV 93.2 02/12/2018   PLT 232 02/12/2018   BMET    Component Value Date/Time   NA 139 02/12/2018 0438   NA 139 06/16/2013 1031   K 4.3 02/12/2018 0438  K 3.8 06/16/2013 1031   CL 104 02/12/2018 0438   CL 105 06/16/2013 1031   CO2 27 02/12/2018 0438   CO2 30 06/16/2013 1031   GLUCOSE 115 (H) 02/12/2018 0438   GLUCOSE 96 06/16/2013 1031   BUN 17 02/12/2018 0438   BUN 23 (H) 06/16/2013 1031   CREATININE 0.53 02/12/2018 0438   CREATININE 0.58 (L) 06/16/2013 1031   CALCIUM 9.1 02/12/2018 0438   CALCIUM 9.7 06/16/2013 1031   GFRNONAA >60 02/12/2018 0438   GFRNONAA >60 06/16/2013 1031   GFRAA >60 02/12/2018 0438   GFRAA >60 06/16/2013 1031   COAG Lab Results  Component Value Date   INR 1.12 02/08/2018    INR 1.21 01/21/2018   INR 1.36 12/14/2017   Disposition:  Discharge to :Skilled nursing facility  Allergies as of 02/12/2018      Reactions   Lodine [etodolac] Hives, Itching   Methotrexate Derivatives Other (See Comments)   Flu like symptoms   Nitrofuran Derivatives Nausea And Vomiting, Other (See Comments)   Made sicker    Levaquin [levofloxacin] Itching, Rash, Other (See Comments)   Muscle pain   Norvasc [amlodipine] Other (See Comments)   Unknown    Pentasa [mesalamine Er] Other (See Comments)   Unknown    Sulfa Antibiotics Itching, Rash   Vioxx [rofecoxib] Other (See Comments)   Unknown       Medication List    STOP taking these medications   oxyCODONE-acetaminophen 5-325 MG tablet Commonly known as:  PERCOCET     TAKE these medications   acetaminophen 500 MG tablet Commonly known as:  TYLENOL Take 1,000 mg by mouth every 8 (eight) hours as needed for mild pain or headache.   apixaban 5 MG Tabs tablet Commonly known as:  ELIQUIS Take 1 tablet (5 mg total) by mouth 2 (two) times daily.   aspirin EC 81 MG tablet Take 1 tablet (81 mg total) by mouth daily.   budesonide 3 MG 24 hr capsule Commonly known as:  ENTOCORT EC Take 3 capsules (9 mg total) by mouth daily. Taper down to 3 mg   CALCIUM 600+D 600-400 MG-UNIT tablet Generic drug:  Calcium Carbonate-Vitamin D Take 1 tablet by mouth 2 (two) times daily.   Cranberry 500 MG Caps Take 500 mg by mouth daily.   diltiazem 240 MG 24 hr capsule Commonly known as:  CARDIZEM CD Take 240 mg by mouth daily.   feeding supplement (ENSURE ENLIVE) Liqd Take 237 mLs by mouth 2 (two) times daily between meals.   ferrous sulfate 325 (65 FE) MG tablet Take 325 mg by mouth daily with breakfast.   gabapentin 300 MG capsule Commonly known as:  NEURONTIN Take 1 capsule (300 mg total) by mouth daily.   gemfibrozil 600 MG tablet Commonly known as:  LOPID Take 600 mg by mouth 2 (two) times daily before a meal.    hydroxychloroquine 200 MG tablet Commonly known as:  PLAQUENIL Take 200 mg by mouth 2 (two) times daily.   leflunomide 20 MG tablet Commonly known as:  ARAVA Take 20 mg by mouth daily.   lisinopril 10 MG tablet Commonly known as:  PRINIVIL,ZESTRIL Take 10 mg by mouth daily.   LORazepam 1 MG tablet Commonly known as:  ATIVAN Take 1 tablet (1 mg total) by mouth 2 (two) times daily. What changed:    when to take this  reasons to take this   Melatonin 3 MG Tabs Take by mouth at bedtime.   multivitamin tablet  Take 1 tablet by mouth daily.   omeprazole 20 MG capsule Commonly known as:  PRILOSEC Take 20 mg by mouth 2 (two) times daily before a meal.   oxyCODONE 5 MG immediate release tablet Commonly known as:  Oxy IR/ROXICODONE Take 1 tablet (5 mg total) by mouth every 6 (six) hours as needed for severe pain.   pravastatin 80 MG tablet Commonly known as:  PRAVACHOL Take 80 mg by mouth at bedtime.   predniSONE 5 MG tablet Commonly known as:  DELTASONE Take 5 mg by mouth daily with breakfast.   PRESERVISION AREDS Caps Take 1 capsule by mouth 2 (two) times daily.   sertraline 50 MG tablet Commonly known as:  ZOLOFT Take 50 mg by mouth at bedtime.   tiZANidine 4 MG tablet Commonly known as:  ZANAFLEX Take 4 mg by mouth daily as needed for muscle spasms (neck pain).   traMADol 50 MG tablet Commonly known as:  ULTRAM Take 1 tablet (50 mg total) by mouth every 6 (six) hours as needed for moderate pain. What changed:  reasons to take this      Verbal and written Discharge instructions given to the patient. Wound care per Discharge AVS  Contact information for follow-up providers    Schnier, Dolores Lory, MD. Go on 03/04/2018.   Specialties:  Vascular Surgery, Cardiology, Radiology, Vascular Surgery Why:  First staple removal. First Post-op visit. Dr. Delana Meyer, Monday, 8/19 at 10:45 a.m., [phone] Contact information: [place]  [phone]            Contact information for after-discharge care    Destination    [facility] Preferred SNF .   Service:  Skilled Nursing Contact information: [address] [place] [phone]                 Signed: Marcelle Overlie  02/12/2018, 11:47 AM

## 2018-02-11 NOTE — Progress Notes (Signed)
[facility] at [place] NAME: Melissa [name]    MR#:  [id number]  DATE OF BIRTH:  Sep 10, 1940  SUBJECTIVE:   Patient is much more awake today.  Pain is controlled on oral oxycodone.  Patient is status post left-sided BKA postop day #3 today.  REVIEW OF SYSTEMS:    Review of Systems  Constitutional: Negative for chills and fever.  HENT: Negative for congestion and tinnitus.   Eyes: Negative for blurred vision and double vision.  Respiratory: Negative for cough, shortness of breath and wheezing.   Cardiovascular: Negative for chest pain, orthopnea and PND.  Gastrointestinal: Positive for constipation. Negative for abdominal pain, diarrhea, nausea and vomiting.  Genitourinary: Negative for dysuria and hematuria.  Musculoskeletal: Positive for joint pain (Left leg pain. ).  Neurological: Negative for dizziness, sensory change and focal weakness.  All other systems reviewed and are negative.   Nutrition: Heart healthy Tolerating Diet: Yes Tolerating PT: Await Eval.   DRUG ALLERGIES:   Allergies  Allergen Reactions  . Lodine [Etodolac] Hives and Itching  . Methotrexate Derivatives Other (See Comments)    Flu like symptoms  . Nitrofuran Derivatives Nausea And Vomiting and Other (See Comments)    Made sicker   . Levaquin [Levofloxacin] Itching, Rash and Other (See Comments)    Muscle pain  . Norvasc [Amlodipine] Other (See Comments)    Unknown   . Pentasa [Mesalamine Er] Other (See Comments)    Unknown   . Sulfa Antibiotics Itching and Rash  . Vioxx [Rofecoxib] Other (See Comments)    Unknown     VITALS:  Blood pressure (!) 100/54, pulse 82, temperature 97.8 F (36.6 C), temperature source Oral, resp. rate 17, height 5' 4"  (1.626 m), weight 49.7 kg (109 lb 9.1 oz), SpO2 98 %.  PHYSICAL EXAMINATION:   Physical Exam  GENERAL:  77 y.o.-year-old patient lying in bed in no acute distress.  EYES: Pupils equal, round, reactive to light and  accommodation. No scleral icterus. Extraocular muscles intact.  HEENT: Head atraumatic, normocephalic. Oropharynx and nasopharynx clear.  NECK:  Supple, no jugular venous distention. No thyroid enlargement, no tenderness.  LUNGS: Normal breath sounds bilaterally, no wheezing, rales, rhonchi. No use of accessory muscles of respiration.  CARDIOVASCULAR: S1, S2 normal. No murmurs, rubs, or gallops.  ABDOMEN: Soft, nontender, nondistended. Bowel sounds present. No organomegaly or mass.  EXTREMITIES: No cyanosis, clubbing or edema b/l.  Left BKA with dressing in place. No bleeding noted.  NEUROLOGIC: Cranial nerves II through XII are intact. No focal Motor or sensory deficits b/l.   PSYCHIATRIC: The patient is alert and oriented x 3.  SKIN: No obvious rash, lesion, or ulcer.    LABORATORY PANEL:   CBC Recent Labs  Lab 02/09/18 0535  WBC 15.8*  HGB 8.9*  HCT 26.8*  PLT 285   ------------------------------------------------------------------------------------------------------------------  Chemistries  Recent Labs  Lab 02/09/18 0535  NA 138  K 3.9  CL 103  CO2 29  GLUCOSE 195*  BUN 13  CREATININE 0.53  CALCIUM 8.3*   ------------------------------------------------------------------------------------------------------------------  Cardiac Enzymes No results for input(s): TROPONINI in the last 168 hours. ------------------------------------------------------------------------------------------------------------------  RADIOLOGY:  No results found.   ASSESSMENT AND PLAN:   77 yo female with past medical history of rheumatoid arthritis, osteoarthritis, psoriasis, GERD, coronary artery disease, COPD, peripheral vascular disease, anxiety who presents to the hospital due to an ulcer on her previous left stump of her previous BKA and underwent debridement of the left BKA  stump.  1.  Status post debridement of the left BKA stump-postop day #3 today. - pain is better controlled  on oral Oxycodone.  -Plan for dressing change as per vascular surgery today.  2.  History of rheumatoid arthritis-continue Plaquenil, leflunomide. -Continue maintenance prednisone.  3.  Essential hypertension- continue lisinopril, Cardizem.  4.  GERD-continue Protonix.  5.  Hyperlipidemia-continue Pravachol.  6. Neuropathy - cont. Neurontin.   7. Constipation - cont. Colace and given Enema on Friday and will follow response.    Seen by PT and they recommend SNF/STR and social Work Aware.   All the records are reviewed and case discussed with Care Management/Social Worker. Management plans discussed with the patient, family and they are in agreement.  CODE STATUS: Full code  DVT Prophylaxis: Eliquis  TOTAL TIME TAKING CARE OF THIS PATIENT: 25 minutes.   POSSIBLE D/C IN 2-3 DAYS, DEPENDING ON CLINICAL CONDITION.   Henreitta Leber M.D on 02/11/2018 at 1:32 PM  Between 7am to 6pm - Pager - [phone]  After 6pm go to [email or web address] - Proofreader  [facility]  Office  [phone]  CC: Primary care physician; [name], MD

## 2018-02-12 LAB — CBC
HEMATOCRIT: 22.3 % — AB (ref 35.0–47.0)
Hemoglobin: 7.6 g/dL — ABNORMAL LOW (ref 12.0–16.0)
MCH: 31.6 pg (ref 26.0–34.0)
MCHC: 33.9 g/dL (ref 32.0–36.0)
MCV: 93.2 fL (ref 80.0–100.0)
Platelets: 232 10*3/uL (ref 150–440)
RBC: 2.4 MIL/uL — ABNORMAL LOW (ref 3.80–5.20)
RDW: 15.7 % — ABNORMAL HIGH (ref 11.5–14.5)
WBC: 9.4 10*3/uL (ref 3.6–11.0)

## 2018-02-12 LAB — BASIC METABOLIC PANEL
Anion gap: 8 (ref 5–15)
BUN: 17 mg/dL (ref 8–23)
CALCIUM: 9.1 mg/dL (ref 8.9–10.3)
CO2: 27 mmol/L (ref 22–32)
CREATININE: 0.53 mg/dL (ref 0.44–1.00)
Chloride: 104 mmol/L (ref 98–111)
GFR calc non Af Amer: >60 mL/min (ref >60)
Glucose, Bld: 115 mg/dL — ABNORMAL HIGH (ref 70–99)
Potassium: 4.3 mmol/L (ref 3.5–5.1)
Sodium: 139 mmol/L (ref 135–145)

## 2018-02-12 LAB — HEMOGLOBIN AND HEMATOCRIT, BLOOD
HEMATOCRIT: 27 % — AB (ref 35.0–47.0)
Hemoglobin: 9.3 g/dL — ABNORMAL LOW (ref 12.0–16.0)

## 2018-02-12 LAB — SURGICAL PATHOLOGY

## 2018-02-12 LAB — PREPARE RBC (CROSSMATCH)

## 2018-02-12 LAB — MAGNESIUM: Magnesium: 1.8 mg/dL (ref 1.7–2.4)

## 2018-02-12 MED ORDER — LORAZEPAM 1 MG PO TABS: 1.0000 mg | ORAL_TABLET | Freq: Two times a day (BID) | ORAL | 0 refills | Status: DC

## 2018-02-12 MED ORDER — SODIUM CHLORIDE 0.9% IV SOLUTION
Freq: Once | INTRAVENOUS | Status: AC
  Administered 2018-02-12: 14:00:00 via INTRAVENOUS

## 2018-02-12 MED ORDER — MAGNESIUM SULFATE 2 GM/50ML IV SOLN
2.0000 g | Freq: Once | INTRAVENOUS | Status: AC
  Administered 2018-02-12: 2 g via INTRAVENOUS
  Filled 2018-02-12: qty 50

## 2018-02-12 NOTE — Progress Notes (Signed)
[facility] at [place] NAME: Melissa [name]    MR#:  [id number]  DATE OF BIRTH:  [birth year]  SUBJECTIVE:   Much more awake and alert today.  Hemoglobin has trended down since surgery and will transfuse 1 unit packed red blood cells.  Possible discharge to rehab today if patient can get insurance authorization.  REVIEW OF SYSTEMS:    Review of Systems  Constitutional: Negative for chills and fever.  HENT: Negative for congestion and tinnitus.   Eyes: Negative for blurred vision and double vision.  Respiratory: Negative for cough, shortness of breath and wheezing.   Cardiovascular: Negative for chest pain, orthopnea and PND.  Gastrointestinal: Positive for constipation. Negative for abdominal pain, diarrhea, nausea and vomiting.  Genitourinary: Negative for dysuria and hematuria.  Musculoskeletal: Positive for joint pain (Left leg pain. ).  Neurological: Negative for dizziness, sensory change and focal weakness.  All other systems reviewed and are negative.   Nutrition: Heart healthy Tolerating Diet: Yes Tolerating PT: Await Eval.   DRUG ALLERGIES:   Allergies  Allergen Reactions  . Lodine [Etodolac] Hives and Itching  . Methotrexate Derivatives Other (See Comments)    Flu like symptoms  . Nitrofuran Derivatives Nausea And Vomiting and Other (See Comments)    Made sicker   . Levaquin [Levofloxacin] Itching, Rash and Other (See Comments)    Muscle pain  . Norvasc [Amlodipine] Other (See Comments)    Unknown   . Pentasa [Mesalamine Er] Other (See Comments)    Unknown   . Sulfa Antibiotics Itching and Rash  . Vioxx [Rofecoxib] Other (See Comments)    Unknown     VITALS:  Blood pressure (!) 102/56, pulse 80, temperature 98.7 F (37.1 C), temperature source Oral, resp. rate 20, height 5' 4"  (1.626 m), weight 50.1 kg (110 lb 8 oz), SpO2 94 %.  PHYSICAL EXAMINATION:   Physical Exam  GENERAL:  77 y.o.-year-old patient lying in bed in  no acute distress.  EYES: Pupils equal, round, reactive to light and accommodation. No scleral icterus. Extraocular muscles intact.  HEENT: Head atraumatic, normocephalic. Oropharynx and nasopharynx clear.  NECK:  Supple, no jugular venous distention. No thyroid enlargement, no tenderness.  LUNGS: Normal breath sounds bilaterally, no wheezing, rales, rhonchi. No use of accessory muscles of respiration.  CARDIOVASCULAR: S1, S2 normal. No murmurs, rubs, or gallops.  ABDOMEN: Soft, nontender, nondistended. Bowel sounds present. No organomegaly or mass.  EXTREMITIES: No cyanosis, clubbing or edema b/l.  Left BKA with dressing in place. No bleeding noted.  NEUROLOGIC: Cranial nerves II through XII are intact. No focal Motor or sensory deficits b/l.   PSYCHIATRIC: The patient is alert and oriented x 3.  SKIN: No obvious rash, lesion, or ulcer.    LABORATORY PANEL:   CBC Recent Labs  Lab 02/12/18 0438  WBC 9.4  HGB 7.6*  HCT 22.3*  PLT 232   ------------------------------------------------------------------------------------------------------------------  Chemistries  Recent Labs  Lab 02/12/18 0438  NA 139  K 4.3  CL 104  CO2 27  GLUCOSE 115*  BUN 17  CREATININE 0.53  CALCIUM 9.1  MG 1.8   ------------------------------------------------------------------------------------------------------------------  Cardiac Enzymes No results for input(s): TROPONINI in the last 168 hours. ------------------------------------------------------------------------------------------------------------------  RADIOLOGY:  No results found.   ASSESSMENT AND PLAN:   77 yo female with past medical history of rheumatoid arthritis, osteoarthritis, psoriasis, GERD, coronary artery disease, COPD, peripheral vascular disease, anxiety who presents to the hospital due to an ulcer on her previous  left stump of her previous BKA and underwent debridement of the left BKA stump.  1.  Status post  debridement of the left BKA stump-postop day #4 today - Continue pain control and further care as per vascular surgery.  Plan for possible discharge to rehab later today.  2.  Acute blood loss anemia-secondary to the recent BKA. - Patient's hemoglobin has dropped from 10.8-7.6 postoperatively.  Will transfuse 1 unit of packed red blood cells today.  3.  History of rheumatoid arthritis-continue Plaquenil, leflunomide. -Continue maintenance prednisone.  4.  Essential hypertension- continue lisinopril, Cardizem.  5.  GERD-continue Protonix.  6.  Hyperlipidemia-continue Pravachol.  7. Neuropathy - cont. Neurontin.   8. Constipation - resolved w/ Laxatives and Enema.   9. Anxiety - cont. PRN Ativan.   Stable to discharge to rehab after blood transfusion from medial standpoint.    All the records are reviewed and case discussed with Care Management/Social Worker. Management plans discussed with the patient, family and they are in agreement.  CODE STATUS: Full code  DVT Prophylaxis: Eliquis  TOTAL TIME TAKING CARE OF THIS PATIENT: 25 minutes.   POSSIBLE D/C IN 1-2 DAYS, DEPENDING ON CLINICAL CONDITION.   Henreitta Leber M.D on 02/12/2018 at 1:51 PM  Between 7am to 6pm - Pager - [phone]  After 6pm go to [email or web address] - Proofreader  [facility]  Office  [phone]  CC: Primary care physician; [name], MD

## 2018-02-12 NOTE — Anesthesia Postprocedure Evaluation (Signed)
Anesthesia Post Note  Patient: Melissa [name]  Procedure(s) Performed: AMPUTATION BELOW KNEE ( REVISION ) (Left )  Patient location during evaluation: PACU Anesthesia Type: General Level of consciousness: awake and alert and oriented Pain management: pain level controlled Vital Signs Assessment: post-procedure vital signs reviewed and stable Respiratory status: spontaneous breathing Cardiovascular status: blood pressure returned to baseline Anesthetic complications: no     Last Vitals:  Vitals:   02/12/18 1430 02/12/18 1800  BP: 110/73 106/72  Pulse: 83 70  Resp: 13 12  Temp: 37 C 36.7 C  SpO2: 98% 98%    Last Pain:  Vitals:   02/12/18 1800  TempSrc: Axillary  PainSc:                  [name]

## 2018-02-12 NOTE — Care Management Important Message (Signed)
Important Message  Patient Details  Name: Melissa [name] MRN: [id number] Date of Birth: [birth year]   Medicare Important Message Given:  Yes    Beverly Sessions, RN 02/12/2018, 11:01 AM

## 2018-02-12 NOTE — Progress Notes (Signed)
[facility]  Daily Progress Note   Subjective: 4 Days Post-Op: Revision left below-the-knee amputation  Patient resting comfortably.  Family at bedside.  Patient did not work with physical therapy or get out of bed today - states "too tired".  Objective: Vitals:   02/12/18 1048 02/12/18 1358 02/12/18 1430 02/12/18 1800  BP: (!) 102/56 (!) 97/49 110/73 106/72  Pulse: 80 81 83 70  Resp:  14 13 12   Temp:  98.7 F (37.1 C) 98.6 F (37 C) 98.1 F (36.7 C)  TempSrc:  Oral Oral Axillary  SpO2:  94% 98% 98%  Weight:      Height:        Intake/Output Summary (Last 24 hours) at 02/12/2018 1907 Last data filed at 02/12/2018 1755 Gross per 24 hour  Intake 775 ml  Output 700 ml  Net 75 ml   Physical Exam: A&Ox3, NAD CV: RRR Pulmonary: CTA Bilaterally Abdomen: Soft, Nontender, Nondistended Vascular:  Left lower extremity: Dressing is clean dry and intact.  No bleeding noted.  Wound is  healthy.  Incision/staples are clean dry and intact.   Laboratory: CBC    Component Value Date/Time   WBC 9.4 02/12/2018 0438   HGB 7.6 (L) 02/12/2018 0438   HGB 11.6 (L) 06/16/2013 1031   HCT 22.3 (L) 02/12/2018 0438   HCT 34.8 (L) 06/16/2013 1031   PLT 232 02/12/2018 0438   PLT 226 06/16/2013 1031   BMET    Component Value Date/Time   NA 139 02/12/2018 0438   NA 139 06/16/2013 1031   K 4.3 02/12/2018 0438   K 3.8 06/16/2013 1031   CL 104 02/12/2018 0438   CL 105 06/16/2013 1031   CO2 27 02/12/2018 0438   CO2 30 06/16/2013 1031   GLUCOSE 115 (H) 02/12/2018 0438   GLUCOSE 96 06/16/2013 1031   BUN 17 02/12/2018 0438   BUN 23 (H) 06/16/2013 1031   CREATININE 0.53 02/12/2018 0438   CREATININE 0.58 (L) 06/16/2013 1031   CALCIUM 9.1 02/12/2018 0438   CALCIUM 9.7 06/16/2013 1031   GFRNONAA >60 02/12/2018 0438   GFRNONAA >60 06/16/2013 1031   GFRAA >60 02/12/2018 0438   GFRAA >60 06/16/2013 1031   Assessment/Planning: 77 year old female postop day #4 left  below the knee amputation revision - stable 1) she received 1 unit packed red blood cells for hemoglobin of 7.6 today / follow-up posttransfusion H&H 2) AM Labs 3) encourage the patient to work with physical therapy and get out of bed to chair tomorrow. 4) from a vascular surgery standpoint the patient can be discharged to SNF once insurance authorization has been received.  Marcelle Overlie PA-C 02/12/2018 7:07 PM

## 2018-02-13 ENCOUNTER — Telehealth (INDEPENDENT_AMBULATORY_CARE_PROVIDER_SITE_OTHER): Payer: Self-pay | Admitting: Vascular Surgery

## 2018-02-13 DIAGNOSIS — E785 Hyperlipidemia, unspecified: Secondary | ICD-10-CM | POA: Diagnosis not present

## 2018-02-13 DIAGNOSIS — T879 Unspecified complications of amputation stump: Secondary | ICD-10-CM | POA: Diagnosis not present

## 2018-02-13 DIAGNOSIS — J449 Chronic obstructive pulmonary disease, unspecified: Secondary | ICD-10-CM | POA: Diagnosis not present

## 2018-02-13 DIAGNOSIS — Z7401 Bed confinement status: Secondary | ICD-10-CM | POA: Diagnosis not present

## 2018-02-13 DIAGNOSIS — I7300 Raynaud's syndrome without gangrene: Secondary | ICD-10-CM | POA: Diagnosis not present

## 2018-02-13 DIAGNOSIS — N390 Urinary tract infection, site not specified: Secondary | ICD-10-CM | POA: Diagnosis not present

## 2018-02-13 DIAGNOSIS — Z8679 Personal history of other diseases of the circulatory system: Secondary | ICD-10-CM | POA: Diagnosis not present

## 2018-02-13 DIAGNOSIS — I7025 Atherosclerosis of native arteries of other extremities with ulceration: Secondary | ICD-10-CM | POA: Diagnosis not present

## 2018-02-13 DIAGNOSIS — I480 Paroxysmal atrial fibrillation: Secondary | ICD-10-CM | POA: Diagnosis not present

## 2018-02-13 DIAGNOSIS — G8929 Other chronic pain: Secondary | ICD-10-CM | POA: Diagnosis not present

## 2018-02-13 DIAGNOSIS — D62 Acute posthemorrhagic anemia: Secondary | ICD-10-CM | POA: Diagnosis not present

## 2018-02-13 DIAGNOSIS — L89890 Pressure ulcer of other site, unstageable: Secondary | ICD-10-CM | POA: Diagnosis not present

## 2018-02-13 DIAGNOSIS — Z1231 Encounter for screening mammogram for malignant neoplasm of breast: Secondary | ICD-10-CM | POA: Diagnosis not present

## 2018-02-13 DIAGNOSIS — F418 Other specified anxiety disorders: Secondary | ICD-10-CM | POA: Diagnosis not present

## 2018-02-13 DIAGNOSIS — T8789 Other complications of amputation stump: Secondary | ICD-10-CM | POA: Diagnosis not present

## 2018-02-13 DIAGNOSIS — R531 Weakness: Secondary | ICD-10-CM | POA: Diagnosis not present

## 2018-02-13 DIAGNOSIS — F3289 Other specified depressive episodes: Secondary | ICD-10-CM | POA: Diagnosis not present

## 2018-02-13 DIAGNOSIS — I4891 Unspecified atrial fibrillation: Secondary | ICD-10-CM | POA: Diagnosis not present

## 2018-02-13 DIAGNOSIS — E7849 Other hyperlipidemia: Secondary | ICD-10-CM | POA: Diagnosis not present

## 2018-02-13 DIAGNOSIS — I10 Essential (primary) hypertension: Secondary | ICD-10-CM | POA: Diagnosis not present

## 2018-02-13 DIAGNOSIS — E7800 Pure hypercholesterolemia, unspecified: Secondary | ICD-10-CM | POA: Diagnosis not present

## 2018-02-13 DIAGNOSIS — E119 Type 2 diabetes mellitus without complications: Secondary | ICD-10-CM | POA: Diagnosis not present

## 2018-02-13 DIAGNOSIS — J410 Simple chronic bronchitis: Secondary | ICD-10-CM | POA: Diagnosis not present

## 2018-02-13 DIAGNOSIS — M069 Rheumatoid arthritis, unspecified: Secondary | ICD-10-CM | POA: Diagnosis not present

## 2018-02-13 DIAGNOSIS — D508 Other iron deficiency anemias: Secondary | ICD-10-CM | POA: Diagnosis not present

## 2018-02-13 DIAGNOSIS — I2510 Atherosclerotic heart disease of native coronary artery without angina pectoris: Secondary | ICD-10-CM | POA: Diagnosis not present

## 2018-02-13 DIAGNOSIS — G629 Polyneuropathy, unspecified: Secondary | ICD-10-CM | POA: Diagnosis not present

## 2018-02-13 DIAGNOSIS — K529 Noninfective gastroenteritis and colitis, unspecified: Secondary | ICD-10-CM | POA: Diagnosis not present

## 2018-02-13 DIAGNOSIS — K219 Gastro-esophageal reflux disease without esophagitis: Secondary | ICD-10-CM | POA: Diagnosis not present

## 2018-02-13 DIAGNOSIS — M6281 Muscle weakness (generalized): Secondary | ICD-10-CM | POA: Diagnosis not present

## 2018-02-13 DIAGNOSIS — Z89512 Acquired absence of left leg below knee: Secondary | ICD-10-CM | POA: Diagnosis not present

## 2018-02-13 LAB — BPAM RBC
Blood Product Expiration Date: 201907312359
ISSUE DATE / TIME: 201907301406
Unit Type and Rh: 9500

## 2018-02-13 LAB — CBC
HEMATOCRIT: 28.1 % — AB (ref 35.0–47.0)
Hemoglobin: 9.7 g/dL — ABNORMAL LOW (ref 12.0–16.0)
MCH: 31.3 pg (ref 26.0–34.0)
MCHC: 34.3 g/dL (ref 32.0–36.0)
MCV: 91.2 fL (ref 80.0–100.0)
Platelets: 217 10*3/uL (ref 150–440)
RBC: 3.08 MIL/uL — ABNORMAL LOW (ref 3.80–5.20)
RDW: 15.5 % — AB (ref 11.5–14.5)
WBC: 7.8 10*3/uL (ref 3.6–11.0)

## 2018-02-13 LAB — TYPE AND SCREEN
ABO/RH(D): A POS
Antibody Screen: NEGATIVE
Unit division: 0

## 2018-02-13 LAB — BASIC METABOLIC PANEL
Anion gap: 10 (ref 5–15)
BUN: 19 mg/dL (ref 8–23)
CALCIUM: 8.7 mg/dL — AB (ref 8.9–10.3)
CO2: 26 mmol/L (ref 22–32)
CREATININE: 0.46 mg/dL (ref 0.44–1.00)
Chloride: 105 mmol/L (ref 98–111)
GFR calc non Af Amer: >60 mL/min (ref >60)
GLUCOSE: 120 mg/dL — AB (ref 70–99)
Potassium: 4 mmol/L (ref 3.5–5.1)
Sodium: 141 mmol/L (ref 135–145)

## 2018-02-13 LAB — MAGNESIUM: Magnesium: 2.1 mg/dL (ref 1.7–2.4)

## 2018-02-13 NOTE — Clinical Social Work Note (Signed)
CSW informed that [facility] got auth this afternoon. Patient and daughter aware and patient to transport via EMS. Discharge information sent to [facility]. Shela Leff MSW,LCSW [phone]

## 2018-02-13 NOTE — Care Management Important Message (Signed)
Important Message  Patient Details  Name: Melissa [name] MRN: [id number] Date of Birth: 1940-08-05   Medicare Important Message Given:  Yes    Beverly Sessions, RN 02/13/2018, 11:41 AM

## 2018-02-13 NOTE — Telephone Encounter (Signed)
Home Health should already have the patient's insurance information and they will file everything on their end. We don't  do anything with the insurance at that point. If the Home Health agency doesn't take her insurance, we can have a different Home Health sent out at that time.

## 2018-02-13 NOTE — Progress Notes (Signed)
Physical Therapy Treatment Patient Details Name: Melissa [name] MRN: [id number] DOB: May 02, 1941 Today's Date: 02/13/2018    History of Present Illness patient is a 77 year old female admitted s/p debridement of gangrenous residual limb following L BKA.  PMH includes RA, PVD, CAD, HLD, r great toe ulcer, MI, Raynauds and ataxia.    PT Comments    Pt tolerated treatment well. Pt was in a very pleasant and optimistic mood on PT arrival. Pt was able to perform bed mobility mod ind, and verbalized moving around in her bed was easier today than days previous. Pt demonstrated the ability to perform sit to stand w/RW CGA, but did have slight balance loss on first attempt and had to return to sitting to prevent falling. After balance loss, pt reported to PT that this happens at home sometimes and that as a result she has fallen four times in previous 6 months. Pt reported she often transfers surfaces using stand pivot transfer, and demonstrated ability to do so from room recliner to hospital bed. Pt pointed out that at times she has to use hop to gait pattern when pivot or scoot transfers are not an option. Once standing pt is able to ambulate short distances using a RW and a hop to gait pattern w/RLE. Pt is was somewhat unsteady and required min assist for safety. Pt's MMT showed weakness in pt's BUE and RLE (3/5 or 4/5 with all major muscle groups). Pt was educated on some options for mitigated phantom limb pain (tactile feedback) and pt reported she has tried this I past with no success. Pt's strength and balance deficits make it unsafe for her return home at this time. Recommend transition to STR upon discharge from acute hospitalization.  Follow Up Recommendations  SNF     Equipment Recommendations  None recommended by PT    Recommendations for Other Services       Precautions / Restrictions Precautions Precautions: Fall Restrictions Weight Bearing Restrictions: Yes(left BKA)    Mobility  Bed  Mobility Overal bed mobility: Needs Assistance Bed Mobility: Supine to Sit     Supine to sit: Modified independent (Device/Increase time)     General bed mobility comments: Pt able to scoot to EOB and sit without physical assist.  Transfers Overall transfer level: Needs assistance Equipment used: Rolling walker (2 wheeled) Transfers: Sit to/from Stand Sit to Stand: Min guard         General transfer comment: Pt is able to stand using RW without need for therpaist assist, however on first attempt loses balance, and has to sit back down. On all subsequent attmepts pt did not lsoe balance stood with increased tiem and effort.   Ambulation/Gait Ambulation/Gait assistance: Min assist Gait Distance (Feet): 10 Feet Assistive device: Rolling walker (2 wheeled)       General Gait Details: Pt uses RLE and BUE for hop to gait pattern usig RW. Pt can become unsteady with this and on once occassion needed therapist min assist for safety.    Stairs             Wheelchair Mobility    Modified Rankin (Stroke Patients Only)       Balance Overall balance assessment: Needs assistance Sitting-balance support: Bilateral upper extremity supported Sitting balance-Leahy Scale: Good     Standing balance support: Bilateral upper extremity supported Standing balance-Leahy Scale: Poor Standing balance comment: Pt can maintain standing position on RLE, but reports getting fatigued very quickly and occassionally loses balance.  Cognition Arousal/Alertness: Awake/alert Behavior During Therapy: WFL for tasks assessed/performed                                   General Comments: Pt was very optimistic and high energy      Exercises Other Exercises Other Exercises: Transfers: Sit to/from stand x5 CGA w/RW, Ambulation: Singel leg hop to gait w/RW min asssit for trasnfer to chair from bed.  Other Exercises: Educated pt in:   performing BLE exercises for increased/maintained strength; proper technique for sitting and standing for optimal safety.    General Comments        Pertinent Vitals/Pain Pain Assessment: No/denies pain    Home Living                      Prior Function            PT Goals (current goals can now be found in the care plan section) Acute Rehab PT Goals Patient Stated Goal: to participate in therapy and continue to get stronger. PT Goal Formulation: With patient Time For Goal Achievement: 02/23/18 Potential to Achieve Goals: Good Progress towards PT goals: Progressing toward goals    Frequency    Min 2X/week      PT Plan Current plan remains appropriate    Co-evaluation              AM-PAC PT "6 Clicks" Daily Activity  Outcome Measure  Difficulty turning over in bed (including adjusting bedclothes, sheets and blankets)?: A Little Difficulty moving from lying on back to sitting on the side of the bed? : A Little Difficulty sitting down on and standing up from a chair with arms (e.g., wheelchair, bedside commode, etc,.)?: A Lot Help needed moving to and from a bed to chair (including a wheelchair)?: A Little Help needed walking in hospital room?: A Lot Help needed climbing 3-5 steps with a railing? : Total 6 Click Score: 14    End of Session Equipment Utilized During Treatment: Gait belt Activity Tolerance: Patient tolerated treatment well Patient left: in chair;with call bell/phone within reach;with chair alarm set;with family/visitor present   PT Visit Diagnosis: Unsteadiness on feet (R26.81);Muscle weakness (generalized) (M62.81)     Time: 2500-3704 PT Time Calculation (min) (ACUTE ONLY): 26 min  Charges:                        Hortencia Conradi, SPT 02/13/18,12:39 PM

## 2018-02-13 NOTE — Clinical Social Work Placement (Signed)
   CLINICAL SOCIAL WORK PLACEMENT  NOTE  Date:  02/13/2018  Patient Details  Name: Melissa [name] MRN: [id number] Date of Birth: [birth year]  Clinical Social Work is seeking post-discharge placement for this patient at the [facility] level of care (*CSW will initial, date and re-position this form in  chart as items are completed):  Yes   Patient/family provided with [facility] Work Department's list of facilities offering this level of care within the geographic area requested by the patient (or if unable, by the patient's family).  Yes   Patient/family informed of their freedom to choose among providers that offer the needed level of care, that participate in Medicare, Medicaid or managed care program needed by the patient, have an available bed and are willing to accept the patient.  Yes   Patient/family informed of [facility]'s ownership interest in [facility] and [facility], as well as of the fact that they are under no obligation to receive care at these facilities.  PASRR submitted to EDS on       PASRR number received on       Existing PASRR number confirmed on 02/08/18     FL2 transmitted to all facilities in geographic area requested by pt/family on 02/08/18     FL2 transmitted to all facilities within larger geographic area on       Patient informed that his/her managed care company has contracts with or will negotiate with certain facilities, including the following:        Yes   Patient/family informed of bed offers received.  Patient chooses bed at ([facility])     Physician recommends and patient chooses bed at [facility])    Patient to be transferred to ([facility]) on 02/13/18.  Patient to be transferred to facility by       Patient family notified on 02/13/18 of transfer.  Name of family member notified:  (daughter)     PHYSICIAN       Additional Comment:    _______________________________________________ Shela Leff,  LCSW 02/13/2018, 4:27 PM

## 2018-02-13 NOTE — Progress Notes (Signed)
Pt prepared for d/c to SNF. IV d/c'd. Skin intact except as charted in most recent assessments. Vitals are stable. Report called to receiving facility. Pt to be transported by ambulance service.  Areesha Dehaven [employer]

## 2018-02-13 NOTE — Progress Notes (Signed)
[facility] [place] NAME: Melissa [name]    MR#:  [id number]  DATE OF BIRTH:  07/21/1940  SUBJECTIVE:   Patient's hemoglobin has improved posttransfusion.  Pain in the left leg post surgery is also improved.  Patient had some loose stools this afternoon but she received some laxatives today.  REVIEW OF SYSTEMS:    Review of Systems  Constitutional: Negative for chills and fever.  HENT: Negative for congestion and tinnitus.   Eyes: Negative for blurred vision and double vision.  Respiratory: Negative for cough, shortness of breath and wheezing.   Cardiovascular: Negative for chest pain, orthopnea and PND.  Gastrointestinal: Positive for diarrhea. Negative for abdominal pain, constipation, nausea and vomiting.  Genitourinary: Negative for dysuria and hematuria.  Musculoskeletal: Positive for joint pain (Left leg pain. ).  Neurological: Negative for dizziness, sensory change and focal weakness.  All other systems reviewed and are negative.   Nutrition: Heart healthy Tolerating Diet: Yes Tolerating PT: Eval noted.   DRUG ALLERGIES:   Allergies  Allergen Reactions  . Lodine [Etodolac] Hives and Itching  . Methotrexate Derivatives Other (See Comments)    Flu like symptoms  . Nitrofuran Derivatives Nausea And Vomiting and Other (See Comments)    Made sicker   . Levaquin [Levofloxacin] Itching, Rash and Other (See Comments)    Muscle pain  . Norvasc [Amlodipine] Other (See Comments)    Unknown   . Pentasa [Mesalamine Er] Other (See Comments)    Unknown   . Sulfa Antibiotics Itching and Rash  . Vioxx [Rofecoxib] Other (See Comments)    Unknown     VITALS:  Blood pressure 128/63, pulse 84, temperature 98.1 F (36.7 C), temperature source Oral, resp. rate 17, height 5' 4"  (1.626 m), weight 50.1 kg (110 lb 8 oz), SpO2 95 %.  PHYSICAL EXAMINATION:   Physical Exam  GENERAL:  77 y.o.-year-old patient lying in bed in no acute distress.   EYES: Pupils equal, round, reactive to light and accommodation. No scleral icterus. Extraocular muscles intact.  HEENT: Head atraumatic, normocephalic. Oropharynx and nasopharynx clear.  NECK:  Supple, no jugular venous distention. No thyroid enlargement, no tenderness.  LUNGS: Normal breath sounds bilaterally, no wheezing, rales, rhonchi. No use of accessory muscles of respiration.  CARDIOVASCULAR: S1, S2 normal. No murmurs, rubs, or gallops.  ABDOMEN: Soft, nontender, nondistended. Bowel sounds present. No organomegaly or mass.  EXTREMITIES: No cyanosis, clubbing or edema b/l.  Left BKA with dressing in place. No bleeding noted.  NEUROLOGIC: Cranial nerves II through XII are intact. No focal Motor or sensory deficits b/l.   PSYCHIATRIC: The patient is alert and oriented x 3.  SKIN: No obvious rash, lesion, or ulcer.    LABORATORY PANEL:   CBC Recent Labs  Lab 02/13/18 0418  WBC 7.8  HGB 9.7*  HCT 28.1*  PLT 217   ------------------------------------------------------------------------------------------------------------------  Chemistries  Recent Labs  Lab 02/13/18 0418  NA 141  K 4.0  CL 105  CO2 26  GLUCOSE 120*  BUN 19  CREATININE 0.46  CALCIUM 8.7*  MG 2.1   ------------------------------------------------------------------------------------------------------------------  Cardiac Enzymes No results for input(s): TROPONINI in the last 168 hours. ------------------------------------------------------------------------------------------------------------------  RADIOLOGY:  No results found.   ASSESSMENT AND PLAN:   77 yo female with past medical history of rheumatoid arthritis, osteoarthritis, psoriasis, GERD, coronary artery disease, COPD, peripheral vascular disease, anxiety who presents to the hospital due to an ulcer on her previous left stump of her previous  BKA and underwent debridement of the left BKA stump.  1.  Status post debridement of the left BKA  stump-postop day #5 today - Continue pain control and further care as per vascular surgery.  Stable.   2.  Acute blood loss anemia-secondary to the recent BKA. - Patient's hemoglobin had dropped from 10.8-7.6 postoperatively.   - transfused 1 unit yesterday Hg. Improved to 9.7. Today.   3.  History of rheumatoid arthritis-continue Plaquenil, leflunomide. -Continue maintenance prednisone.  4.  Essential hypertension- continue lisinopril, Cardizem.  5.  GERD-continue Protonix.  6.  Hyperlipidemia-continue Pravachol.  7. Neuropathy - cont. Neurontin.   8. Constipation - resolved w/ Laxatives and Enema.   9. Anxiety - cont. PRN Ativan.   Stable to be discharged from medical standpoint.   All the records are reviewed and case discussed with Care Management/Social Worker. Management plans discussed with the patient, family and they are in agreement.  CODE STATUS: Full code  DVT Prophylaxis: Eliquis  TOTAL TIME TAKING CARE OF THIS PATIENT: 25 minutes.   POSSIBLE D/C IN 1-2 DAYS, DEPENDING ON CLINICAL CONDITION.   Henreitta Leber M.D on 02/13/2018 at 1:08 PM  Between 7am to 6pm - Pager - [phone]  After 6pm go to [email or web address] - Proofreader  [facility]  Office  [phone]  CC: Primary care physician; [name], MD

## 2018-02-13 NOTE — Clinical Social Work Note (Signed)
CSW received word from Tammy at [facility] that they called to check with [facility] where they were with having a disposition on authorization and they were told the insurance auth had not been started yet on their end. Tammy said that [facility] also stated they would need another PT note. CSW called the [facility] number for Amy, the case manager assigned to patient's case, [phone] ext: [phone] and had to leave a message.  Shela Leff MSW,LCSW [phone]

## 2018-02-16 DIAGNOSIS — E785 Hyperlipidemia, unspecified: Secondary | ICD-10-CM | POA: Diagnosis not present

## 2018-02-16 DIAGNOSIS — Z89512 Acquired absence of left leg below knee: Secondary | ICD-10-CM | POA: Diagnosis not present

## 2018-02-16 DIAGNOSIS — K529 Noninfective gastroenteritis and colitis, unspecified: Secondary | ICD-10-CM | POA: Diagnosis not present

## 2018-02-16 DIAGNOSIS — F418 Other specified anxiety disorders: Secondary | ICD-10-CM | POA: Diagnosis not present

## 2018-02-16 DIAGNOSIS — I10 Essential (primary) hypertension: Secondary | ICD-10-CM | POA: Diagnosis not present

## 2018-02-16 DIAGNOSIS — K219 Gastro-esophageal reflux disease without esophagitis: Secondary | ICD-10-CM | POA: Diagnosis not present

## 2018-02-16 DIAGNOSIS — M069 Rheumatoid arthritis, unspecified: Secondary | ICD-10-CM | POA: Diagnosis not present

## 2018-02-16 DIAGNOSIS — I2510 Atherosclerotic heart disease of native coronary artery without angina pectoris: Secondary | ICD-10-CM | POA: Diagnosis not present

## 2018-02-16 DIAGNOSIS — T8789 Other complications of amputation stump: Secondary | ICD-10-CM | POA: Diagnosis not present

## 2018-02-21 DIAGNOSIS — I10 Essential (primary) hypertension: Secondary | ICD-10-CM | POA: Diagnosis not present

## 2018-02-21 DIAGNOSIS — I2510 Atherosclerotic heart disease of native coronary artery without angina pectoris: Secondary | ICD-10-CM | POA: Diagnosis not present

## 2018-02-21 DIAGNOSIS — E785 Hyperlipidemia, unspecified: Secondary | ICD-10-CM | POA: Diagnosis not present

## 2018-02-21 DIAGNOSIS — M069 Rheumatoid arthritis, unspecified: Secondary | ICD-10-CM | POA: Diagnosis not present

## 2018-02-21 DIAGNOSIS — K529 Noninfective gastroenteritis and colitis, unspecified: Secondary | ICD-10-CM | POA: Diagnosis not present

## 2018-02-21 DIAGNOSIS — F418 Other specified anxiety disorders: Secondary | ICD-10-CM | POA: Diagnosis not present

## 2018-02-21 DIAGNOSIS — K219 Gastro-esophageal reflux disease without esophagitis: Secondary | ICD-10-CM | POA: Diagnosis not present

## 2018-02-22 ENCOUNTER — Other Ambulatory Visit (INDEPENDENT_AMBULATORY_CARE_PROVIDER_SITE_OTHER): Payer: Self-pay | Admitting: Vascular Surgery

## 2018-02-25 ENCOUNTER — Telehealth (INDEPENDENT_AMBULATORY_CARE_PROVIDER_SITE_OTHER): Payer: Self-pay

## 2018-02-25 NOTE — Telephone Encounter (Signed)
Patient daughter called stating that her mom still having some bleeding and also pain from amputation site.The daughter stated that her mom was going to physical therapy and was thinking that might be causing the pain.I spoke with GS and he advise that patient should move up appointment to this week and she will be coming on 02/27/18

## 2018-02-27 ENCOUNTER — Ambulatory Visit (INDEPENDENT_AMBULATORY_CARE_PROVIDER_SITE_OTHER): Payer: Medicare HMO | Admitting: Nurse Practitioner

## 2018-02-27 ENCOUNTER — Encounter (INDEPENDENT_AMBULATORY_CARE_PROVIDER_SITE_OTHER): Payer: Self-pay

## 2018-02-27 VITALS — BP 120/72 | HR 73 | Resp 16 | Wt 110.0 lb

## 2018-02-27 DIAGNOSIS — K219 Gastro-esophageal reflux disease without esophagitis: Secondary | ICD-10-CM | POA: Diagnosis not present

## 2018-02-27 DIAGNOSIS — E7800 Pure hypercholesterolemia, unspecified: Secondary | ICD-10-CM

## 2018-02-27 DIAGNOSIS — T8789 Other complications of amputation stump: Secondary | ICD-10-CM | POA: Diagnosis not present

## 2018-02-27 DIAGNOSIS — T879 Unspecified complications of amputation stump: Secondary | ICD-10-CM

## 2018-02-27 DIAGNOSIS — F418 Other specified anxiety disorders: Secondary | ICD-10-CM | POA: Diagnosis not present

## 2018-02-27 DIAGNOSIS — M069 Rheumatoid arthritis, unspecified: Secondary | ICD-10-CM | POA: Diagnosis not present

## 2018-02-27 DIAGNOSIS — K529 Noninfective gastroenteritis and colitis, unspecified: Secondary | ICD-10-CM | POA: Diagnosis not present

## 2018-02-27 DIAGNOSIS — I10 Essential (primary) hypertension: Secondary | ICD-10-CM

## 2018-02-27 DIAGNOSIS — I2510 Atherosclerotic heart disease of native coronary artery without angina pectoris: Secondary | ICD-10-CM | POA: Diagnosis not present

## 2018-02-27 DIAGNOSIS — E785 Hyperlipidemia, unspecified: Secondary | ICD-10-CM | POA: Diagnosis not present

## 2018-02-28 ENCOUNTER — Encounter (INDEPENDENT_AMBULATORY_CARE_PROVIDER_SITE_OTHER): Payer: Self-pay

## 2018-02-28 DIAGNOSIS — I480 Paroxysmal atrial fibrillation: Secondary | ICD-10-CM | POA: Diagnosis not present

## 2018-02-28 DIAGNOSIS — L89890 Pressure ulcer of other site, unstageable: Secondary | ICD-10-CM | POA: Diagnosis not present

## 2018-02-28 DIAGNOSIS — J410 Simple chronic bronchitis: Secondary | ICD-10-CM | POA: Diagnosis not present

## 2018-02-28 DIAGNOSIS — E119 Type 2 diabetes mellitus without complications: Secondary | ICD-10-CM | POA: Diagnosis not present

## 2018-02-28 DIAGNOSIS — I7025 Atherosclerosis of native arteries of other extremities with ulceration: Secondary | ICD-10-CM | POA: Diagnosis not present

## 2018-02-28 DIAGNOSIS — I10 Essential (primary) hypertension: Secondary | ICD-10-CM | POA: Diagnosis not present

## 2018-02-28 DIAGNOSIS — Z89512 Acquired absence of left leg below knee: Secondary | ICD-10-CM | POA: Diagnosis not present

## 2018-02-28 DIAGNOSIS — E7800 Pure hypercholesterolemia, unspecified: Secondary | ICD-10-CM | POA: Diagnosis not present

## 2018-02-28 DIAGNOSIS — T8789 Other complications of amputation stump: Secondary | ICD-10-CM | POA: Diagnosis not present

## 2018-02-28 DIAGNOSIS — I7300 Raynaud's syndrome without gangrene: Secondary | ICD-10-CM | POA: Diagnosis not present

## 2018-02-28 NOTE — Progress Notes (Signed)
Subjective:    Patient ID: Melissa [name], female    DOB: 1941-03-05, 77 y.o.   MRN: [id number] Chief Complaint  Patient presents with  . Follow-up    [facility] 3wk     HPI  The patient presents today for follow-up after her BKA revision.  The wound has a very scant amount of drainage from one spot.  The patient has not been having any issues with her wound.  Today we removed every other suture.  Constitutional: [] Weight loss  [] Fever  [] Chills Cardiac: [] Chest pain   [] Chest pressure   [] Palpitations   [] Shortness of breath when laying flat   [] Shortness of breath with exertion. Vascular:  [] Pain in legs with walking   [] Pain in legs with standing  [] History of DVT   [] Phlebitis   [] Swelling in legs   [] Varicose veins   [] Non-healing ulcers Pulmonary:   [] Uses home oxygen   [] Productive cough   [] Hemoptysis   [] Wheeze  [] COPD   [] Asthma Neurologic:  [] Dizziness   [] Seizures   [] History of stroke   [] History of TIA  [] Aphasia   [] Vissual changes   [] Weakness or numbness in arm   [x] Weakness or numbness in leg Musculoskeletal:   [] Joint swelling   [] Joint pain   [] Low back pain Hematologic:  [] Easy bruising  [] Easy bleeding   [] Hypercoagulable state   [] Anemic Gastrointestinal:  [] Diarrhea   [] Vomiting  [] Gastroesophageal reflux/heartburn   [] Difficulty swallowing. Genitourinary:  [] Chronic kidney disease   [] Difficult urination  [] Frequent urination   [] Blood in urine Skin:  [] Rashes   [] Ulcers  Psychological:  [] History of anxiety   []  History of major depression.     Objective:   Physical Exam  BP 120/72 (BP Location: Right Arm)   Pulse 73   Resp 16   Wt 110 lb (49.9 kg)   BMI 18.88 kg/m   Past Medical History:  Diagnosis Date  . Anemia 12/2017   blood transfusion after amputation  . Anxiety   . Arthritis    rheumatoid  . Ataxia   . Chronic ulcer of right great toe ([place]) 2019   slowly healing  . COPD (chronic obstructive pulmonary disease) ([place])   . Coronary artery disease  2018   complete blockage in one vessel, 40 % in another, 25% in 3rd vessell  . Depression   . Diverticulosis   . Dysrhythmia    atrial fib  . Excessive falling   . GERD (gastroesophageal reflux disease)    ulcerative colitis  . Hypercholesteremia   . Hyperlipidemia   . Hypertension   . IBS (irritable bowel syndrome)   . MRSA (methicillin resistant staph aureus) culture positive 11/2017   patient unaware of this diagnosis, was never informed or treated  . Myocardial infarction ([place]) 08/2017   had angio but no stents  . Osteoarthritis   . Psoriasis   . Raynaud's disease without gangrene   . Raynaud's disease without gangrene   . Shortness of breath dyspnea    with exertion  . 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  Ear/Nose/Throat: Hearing grossly intact, nares w/o erythema or drainage,  Eyes: PER, EOMI, sclera nonicteric.  Neck: Supple, no masses.  No bruit or JVD.  Pulmonary:  Good air movement, clear to auscultation bilaterally, no use of accessory muscles.  Cardiac: RRR, normal S1, S2, no Murmurs. Vascular: L BKA, multiple sutures in place,  Vessel  Right Left  Radial palpable palpable  PT palpable   Gastrointestinal: soft, non-distended. No guarding/no peritoneal signs.  Musculoskeletal: M/S 5/5 throughout.  No deformity or atrophy.  Neurologic: CN 2-12 intact. Pain and light touch intact in extremities.  Symmetrical.  Speech is fluent. Motor exam as listed above. Psychiatric: Judgment intact, Mood & affect appropriate for pt's clinical situation. Dermatologic: no venous rashes no ulcers noted.  No changes consistent with cellulitis. Lymph : No Cervical lymphadenopathy, no lichenification or skin changes of chronic lymphedema.   Social History   Socioeconomic History  . Marital status: Married    Spouse name: Not on file  . Number of children: Not on file  . Years of education: Not  on file  . Highest education level: Not on file  Occupational History  . Not on file  Social Needs  . Financial resource strain: Not on file  . Food insecurity:    Worry: Not on file    Inability: Not on file  . Transportation needs:    Medical: Not on file    Non-medical: Not on file  Tobacco Use  . Smoking status: Never Smoker  . Smokeless tobacco: Never Used  Substance and Sexual Activity  . Alcohol use: No  . Drug use: No  . Sexual activity: Not Currently  Lifestyle  . Physical activity:    Days per week: Not on file    Minutes per session: Not on file  . Stress: Not on file  Relationships  . Social connections:    Talks on phone: Not on file    Gets together: Not on file    Attends religious service: Not on file    Active member of club or organization: Not on file    Attends meetings of clubs or organizations: Not on file    Relationship status: Not on file  . Intimate partner violence:    Fear of current or ex partner: Not on file    Emotionally abused: Not on file    Physically abused: Not on file    Forced sexual activity: Not on file  Other Topics Concern  . Not on file  Social History Narrative  . Not on file    Past Surgical History:  Procedure Laterality Date  . ABDOMINAL HYSTERECTOMY  1980  . AMPUTATION Left 12/12/2017   Procedure: AMPUTATION BELOW KNEE;  Surgeon: Katha Cabal, MD;  Location: [facility] ORS;  Service: Vascular;  Laterality: Left;  . AMPUTATION Left 02/08/2018   Procedure: AMPUTATION BELOW KNEE ( REVISION );  Surgeon: Katha Cabal, MD;  Location: [facility] ORS;  Service: Vascular;  Laterality: Left;  . APPENDECTOMY  1956  . APPLICATION OF WOUND VAC Left 01/25/2018   Procedure: APPLICATION OF WOUND VAC;  Surgeon: Katha Cabal, MD;  Location: [facility] ORS;  Service: Vascular;  Laterality: Left;  . BACK SURGERY  2013   Spinal fusion with rods and screws  . CHOLECYSTECTOMY    . COLONOSCOPY WITH PROPOFOL N/A 01/29/2017   Procedure:  COLONOSCOPY WITH PROPOFOL;  Surgeon: Manya Silvas, MD;  Location: [facility] ENDOSCOPY;  Service: Endoscopy;  Laterality: N/A;  . ESOPHAGOGASTRODUODENOSCOPY (EGD) WITH PROPOFOL N/A 01/29/2017   Procedure: ESOPHAGOGASTRODUODENOSCOPY (EGD) WITH PROPOFOL;  Surgeon: Manya Silvas, MD;  Location: [facility] ENDOSCOPY;  Service: Endoscopy;  Laterality: N/A;  . EYE SURGERY Bilateral 2012   Cataract Extraction with IOL  . KNEE ARTHROSCOPY Left 2009   partial lateral meniscectomy, debridement, excision of Baker's cyst  . LEFT HEART  CATH AND CORONARY ANGIOGRAPHY N/A 04/23/2017   Procedure: LEFT HEART CATH AND CORONARY ANGIOGRAPHY;  Surgeon: Teodoro Spray, MD;  Location: [place] CV LAB;  Service: Cardiovascular;  Laterality: N/A;  . LEFT HEART CATH AND CORONARY ANGIOGRAPHY Left 09/07/2017   Procedure: LEFT HEART CATH AND CORONARY ANGIOGRAPHY;  Surgeon: Teodoro Spray, MD;  Location: [place] CV LAB;  Service: Cardiovascular;  Laterality: Left;  . LOWER EXTREMITY ANGIOGRAPHY Left 11/27/2017   Procedure: LOWER EXTREMITY ANGIOGRAPHY;  Surgeon: Katha Cabal, MD;  Location: [place] [facility];  Service: Cardiovascular;  Laterality: Left;  . LOWER EXTREMITY ANGIOGRAPHY Left 12/05/2017   Procedure: LOWER EXTREMITY ANGIOGRAPHY;  Surgeon: Katha Cabal, MD;  Location: [place] CV LAB;  Service: Cardiovascular;  Laterality: Left;  . ROTATOR CUFF REPAIR Right 1990  . TOTAL KNEE ARTHROPLASTY Left 04/04/2016   Procedure: TOTAL KNEE ARTHROPLASTY;  Surgeon: Hessie Knows, MD;  Location: [facility] ORS;  Service: Orthopedics;  Laterality: Left;  . WOUND DEBRIDEMENT Left 01/25/2018   Procedure: DEBRIDEMENT WOUND;  Surgeon: Katha Cabal, MD;  Location: [facility] ORS;  Service: Vascular;  Laterality: Left;    Family History  Problem Relation Age of Onset  . CAD Mother   . CAD Father   . Alzheimer's disease Sister   . CAD Sister   . CAD Brother   . Breast cancer Neg Hx     Allergies  Allergen  Reactions  . Lodine [Etodolac] Hives and Itching  . Methotrexate Derivatives Other (See Comments)    Flu like symptoms  . Nitrofuran Derivatives Nausea And Vomiting and Other (See Comments)    Made sicker   . Levaquin [Levofloxacin] Itching, Rash and Other (See Comments)    Muscle pain  . Norvasc [Amlodipine] Other (See Comments)    Unknown   . Pentasa [Mesalamine Er] Other (See Comments)    Unknown   . Sulfa Antibiotics Itching and Rash  . Vioxx [Rofecoxib] Other (See Comments)    Unknown        Assessment & Plan:   1. 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  There was a scant amount of blood on the medial edge of the wound.  Wound appears to be healing well, every other suture was removed.  She will return in one week for further suture removal.    2. Essential hypertension-Stable  Continue antihypertensive medications as already ordered, these medications have been reviewed and there are no changes at this time.   3. Pure hypercholesterolemia-Stable Continue statin as ordered and reviewed, no changes at this time    Current Outpatient Medications on File Prior to Visit  Medication Sig Dispense Refill  . acetaminophen (TYLENOL) 500 MG tablet Take 1,000 mg by mouth every 8 (eight) hours as needed for mild pain or headache.    Marland Kitchen apixaban (ELIQUIS) 5 MG TABS tablet Take 1 tablet (5 mg total) by mouth 2 (two) times daily. 60 tablet   . aspirin EC 81 MG tablet Take 1 tablet (81 mg total) by mouth daily.    . budesonide (ENTOCORT EC) 3 MG 24 hr capsule Take 3 capsules (9 mg total) by mouth daily. Taper down to 3 mg 30 capsule 0  . Calcium Carbonate-Vitamin D (CALCIUM 600+D) 600-400 MG-UNIT tablet Take 1 tablet by mouth 2 (two) times daily.     . Cranberry 500 MG CAPS Take 500 mg by mouth daily.    Marland Kitchen  diltiazem (CARDIZEM CD) 240 MG 24 hr capsule Take 240 mg by mouth daily.      . feeding supplement, ENSURE ENLIVE, (ENSURE ENLIVE) LIQD Take 237 mLs by mouth 2 (two) times daily between meals. 237 mL 12  . ferrous sulfate 325 (65 FE) MG tablet Take 325 mg by mouth daily with breakfast.    . gabapentin (NEURONTIN) 300 MG capsule Take 1 capsule (300 mg total) by mouth daily. 30 capsule 0  . gemfibrozil (LOPID) 600 MG tablet Take 600 mg by mouth 2 (two) times daily before a meal.    . hydroxychloroquine (PLAQUENIL) 200 MG tablet Take 200 mg by mouth 2 (two) times daily.     Marland Kitchen lisinopril (PRINIVIL,ZESTRIL) 10 MG tablet Take 10 mg by mouth daily.    Marland Kitchen LORazepam (ATIVAN) 1 MG tablet Take 1 tablet (1 mg total) by mouth 2 (two) times daily. 20 tablet 0  . Melatonin 3 MG TABS Take by mouth at bedtime.    . Multiple Vitamin (MULTIVITAMIN) tablet Take 1 tablet by mouth daily.    . Multiple Vitamins-Minerals (PRESERVISION AREDS) CAPS Take 1 capsule by mouth 2 (two) times daily.    Marland Kitchen omeprazole (PRILOSEC) 20 MG capsule Take 20 mg by mouth 2 (two) times daily before a meal.     . oxyCODONE (OXY IR/ROXICODONE) 5 MG immediate release tablet Take 1 tablet (5 mg total) by mouth every 6 (six) hours as needed for severe pain. 28 tablet 0  . pravastatin (PRAVACHOL) 80 MG tablet Take 80 mg by mouth at bedtime.     . sertraline (ZOLOFT) 50 MG tablet Take 50 mg by mouth at bedtime.     Marland Kitchen tiZANidine (ZANAFLEX) 4 MG tablet Take 4 mg by mouth daily as needed for muscle spasms (neck pain).     . traMADol (ULTRAM) 50 MG tablet Take 1 tablet (50 mg total) by mouth every 6 (six) hours as needed for moderate pain. 28 tablet 2  . clopidogrel (PLAVIX) 75 MG tablet TAKE 1 TABLET BY MOUTH EVERY DAY (Patient not taking: Reported on 02/27/2018) 90 tablet 1  . leflunomide (ARAVA) 20 MG tablet Take 20 mg by mouth daily.    . predniSONE (DELTASONE) 5 MG tablet Take 5 mg by mouth daily with breakfast.      No current facility-administered medications on file prior to visit.     There are no Patient  Instructions on file for this visit. Return in about 1 week (around 03/06/2018).   Kris Hartmann, NP

## 2018-03-01 ENCOUNTER — Ambulatory Visit
Admission: RE | Admit: 2018-03-01 | Discharge: 2018-03-01 | Disposition: A | Discharge status: Home / Self Care | Payer: Medicare HMO | Source: Ambulatory Visit | Attending: Internal Medicine | Admitting: Internal Medicine

## 2018-03-01 DIAGNOSIS — K529 Noninfective gastroenteritis and colitis, unspecified: Secondary | ICD-10-CM | POA: Diagnosis not present

## 2018-03-01 DIAGNOSIS — Z1231 Encounter for screening mammogram for malignant neoplasm of breast: Secondary | ICD-10-CM | POA: Diagnosis not present

## 2018-03-03 DIAGNOSIS — I10 Essential (primary) hypertension: Secondary | ICD-10-CM | POA: Diagnosis not present

## 2018-03-03 DIAGNOSIS — M069 Rheumatoid arthritis, unspecified: Secondary | ICD-10-CM | POA: Diagnosis not present

## 2018-03-03 DIAGNOSIS — Z89512 Acquired absence of left leg below knee: Secondary | ICD-10-CM | POA: Diagnosis not present

## 2018-03-03 DIAGNOSIS — I7300 Raynaud's syndrome without gangrene: Secondary | ICD-10-CM | POA: Diagnosis not present

## 2018-03-03 DIAGNOSIS — J449 Chronic obstructive pulmonary disease, unspecified: Secondary | ICD-10-CM | POA: Diagnosis not present

## 2018-03-03 DIAGNOSIS — I7090 Unspecified atherosclerosis: Secondary | ICD-10-CM | POA: Diagnosis not present

## 2018-03-03 DIAGNOSIS — Z4781 Encounter for orthopedic aftercare following surgical amputation: Secondary | ICD-10-CM | POA: Diagnosis not present

## 2018-03-03 DIAGNOSIS — D649 Anemia, unspecified: Secondary | ICD-10-CM | POA: Diagnosis not present

## 2018-03-03 DIAGNOSIS — E785 Hyperlipidemia, unspecified: Secondary | ICD-10-CM | POA: Diagnosis not present

## 2018-03-04 ENCOUNTER — Telehealth (INDEPENDENT_AMBULATORY_CARE_PROVIDER_SITE_OTHER): Payer: Self-pay

## 2018-03-04 ENCOUNTER — Ambulatory Visit (INDEPENDENT_AMBULATORY_CARE_PROVIDER_SITE_OTHER): Payer: Self-pay | Admitting: Vascular Surgery

## 2018-03-04 ENCOUNTER — Other Ambulatory Visit (INDEPENDENT_AMBULATORY_CARE_PROVIDER_SITE_OTHER): Payer: Self-pay | Admitting: Vascular Surgery

## 2018-03-04 ENCOUNTER — Other Ambulatory Visit
Admission: RE | Admit: 2018-03-04 | Discharge: 2018-03-04 | Disposition: A | Discharge status: Home / Self Care | Payer: Medicare HMO | Source: Ambulatory Visit | Attending: Nurse Practitioner | Admitting: Nurse Practitioner

## 2018-03-04 DIAGNOSIS — Z89512 Acquired absence of left leg below knee: Secondary | ICD-10-CM | POA: Diagnosis not present

## 2018-03-04 DIAGNOSIS — I7300 Raynaud's syndrome without gangrene: Secondary | ICD-10-CM | POA: Diagnosis not present

## 2018-03-04 DIAGNOSIS — M069 Rheumatoid arthritis, unspecified: Secondary | ICD-10-CM | POA: Diagnosis not present

## 2018-03-04 DIAGNOSIS — E785 Hyperlipidemia, unspecified: Secondary | ICD-10-CM | POA: Diagnosis not present

## 2018-03-04 DIAGNOSIS — J449 Chronic obstructive pulmonary disease, unspecified: Secondary | ICD-10-CM | POA: Diagnosis not present

## 2018-03-04 DIAGNOSIS — I10 Essential (primary) hypertension: Secondary | ICD-10-CM | POA: Diagnosis not present

## 2018-03-04 DIAGNOSIS — Z4781 Encounter for orthopedic aftercare following surgical amputation: Secondary | ICD-10-CM | POA: Diagnosis not present

## 2018-03-04 DIAGNOSIS — I7090 Unspecified atherosclerosis: Secondary | ICD-10-CM | POA: Diagnosis not present

## 2018-03-04 DIAGNOSIS — K529 Noninfective gastroenteritis and colitis, unspecified: Secondary | ICD-10-CM | POA: Insufficient documentation

## 2018-03-04 DIAGNOSIS — D649 Anemia, unspecified: Secondary | ICD-10-CM | POA: Diagnosis not present

## 2018-03-04 LAB — GASTROINTESTINAL PANEL BY PCR, STOOL (REPLACES STOOL CULTURE)

## 2018-03-04 LAB — C DIFFICILE QUICK SCREEN W PCR REFLEX
C Diff antigen: POSITIVE — AB
C Diff interpretation: DETECTED
C Diff toxin: POSITIVE — AB

## 2018-03-04 NOTE — Telephone Encounter (Signed)
Lauren from [facility] [phone] requesting wound orders for patient since she has been released home.I spoke with Schnier and his orders are fluffed up gauze,kerlix,and ace bandage every other day.

## 2018-03-05 DIAGNOSIS — I10 Essential (primary) hypertension: Secondary | ICD-10-CM | POA: Diagnosis not present

## 2018-03-05 DIAGNOSIS — Z89512 Acquired absence of left leg below knee: Secondary | ICD-10-CM | POA: Diagnosis not present

## 2018-03-05 DIAGNOSIS — D649 Anemia, unspecified: Secondary | ICD-10-CM | POA: Diagnosis not present

## 2018-03-05 DIAGNOSIS — I7300 Raynaud's syndrome without gangrene: Secondary | ICD-10-CM | POA: Diagnosis not present

## 2018-03-05 DIAGNOSIS — J449 Chronic obstructive pulmonary disease, unspecified: Secondary | ICD-10-CM | POA: Diagnosis not present

## 2018-03-05 DIAGNOSIS — T8789 Other complications of amputation stump: Secondary | ICD-10-CM | POA: Diagnosis not present

## 2018-03-05 DIAGNOSIS — I7090 Unspecified atherosclerosis: Secondary | ICD-10-CM | POA: Diagnosis not present

## 2018-03-05 DIAGNOSIS — K5790 Diverticulosis of intestine, part unspecified, without perforation or abscess without bleeding: Secondary | ICD-10-CM | POA: Diagnosis not present

## 2018-03-05 DIAGNOSIS — M069 Rheumatoid arthritis, unspecified: Secondary | ICD-10-CM | POA: Diagnosis not present

## 2018-03-05 LAB — CALPROTECTIN, FECAL: Calprotectin, Fecal: 253 ug/g — ABNORMAL HIGH (ref 0–120)

## 2018-03-06 DIAGNOSIS — I7090 Unspecified atherosclerosis: Secondary | ICD-10-CM | POA: Diagnosis not present

## 2018-03-06 DIAGNOSIS — D649 Anemia, unspecified: Secondary | ICD-10-CM | POA: Diagnosis not present

## 2018-03-06 DIAGNOSIS — I7300 Raynaud's syndrome without gangrene: Secondary | ICD-10-CM | POA: Diagnosis not present

## 2018-03-06 DIAGNOSIS — I10 Essential (primary) hypertension: Secondary | ICD-10-CM | POA: Diagnosis not present

## 2018-03-06 DIAGNOSIS — K5790 Diverticulosis of intestine, part unspecified, without perforation or abscess without bleeding: Secondary | ICD-10-CM | POA: Diagnosis not present

## 2018-03-06 DIAGNOSIS — Z89512 Acquired absence of left leg below knee: Secondary | ICD-10-CM | POA: Diagnosis not present

## 2018-03-06 DIAGNOSIS — M069 Rheumatoid arthritis, unspecified: Secondary | ICD-10-CM | POA: Diagnosis not present

## 2018-03-06 DIAGNOSIS — T8789 Other complications of amputation stump: Secondary | ICD-10-CM | POA: Diagnosis not present

## 2018-03-06 DIAGNOSIS — J449 Chronic obstructive pulmonary disease, unspecified: Secondary | ICD-10-CM | POA: Diagnosis not present

## 2018-03-07 ENCOUNTER — Encounter (INDEPENDENT_AMBULATORY_CARE_PROVIDER_SITE_OTHER): Payer: Self-pay | Admitting: Vascular Surgery

## 2018-03-07 ENCOUNTER — Other Ambulatory Visit (INDEPENDENT_AMBULATORY_CARE_PROVIDER_SITE_OTHER): Payer: Self-pay

## 2018-03-07 ENCOUNTER — Ambulatory Visit (INDEPENDENT_AMBULATORY_CARE_PROVIDER_SITE_OTHER): Payer: Medicare HMO | Admitting: Vascular Surgery

## 2018-03-07 ENCOUNTER — Encounter (INDEPENDENT_AMBULATORY_CARE_PROVIDER_SITE_OTHER): Payer: Self-pay

## 2018-03-07 VITALS — BP 110/66 | HR 82 | Resp 16

## 2018-03-07 DIAGNOSIS — T8789 Other complications of amputation stump: Secondary | ICD-10-CM

## 2018-03-07 DIAGNOSIS — L89890 Pressure ulcer of other site, unstageable: Secondary | ICD-10-CM

## 2018-03-07 MED ORDER — CEFAZOLIN SODIUM-DEXTROSE 1-4 GM/50ML-% IV SOLN
1.0000 g | Freq: Once | INTRAVENOUS | Status: AC
  Administered 2018-03-08: 1 g via INTRAVENOUS

## 2018-03-07 NOTE — Progress Notes (Addendum)
Patient ID: Melissa [name], female   DOB: 04-Jan-1941, 77 y.o.   MRN: [id number]  No chief complaint on file.   HPI Melissa [name] is a 77 y.o. female.    S/P revision of BKA on 02/08/2018  She notes increased drainage and increased odor No fever or chills  Past Medical History:  Diagnosis Date  . Anemia 12/2017   blood transfusion after amputation  . Anxiety   . Arthritis    rheumatoid  . Ataxia   . Chronic ulcer of right great toe ([place]) 2019   slowly healing  . COPD (chronic obstructive pulmonary disease) ([place])   . Coronary artery disease 2018   complete blockage in one vessel, 40 % in another, 25% in 3rd vessell  . Depression   . Diverticulosis   . Dysrhythmia    atrial fib  . Excessive falling   . GERD (gastroesophageal reflux disease)    ulcerative colitis  . Hypercholesteremia   . Hyperlipidemia   . Hypertension   . IBS (irritable bowel syndrome)   . MRSA (methicillin resistant staph aureus) culture positive 11/2017   patient unaware of this diagnosis, was never informed or treated  . Myocardial infarction ([place]) 08/2017   had angio but no stents  . Osteoarthritis   . Psoriasis   . Raynaud's disease without gangrene   . Raynaud's disease without gangrene   . Shortness of breath dyspnea    with exertion  . 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 ABDOMINAL HYSTERECTOMY  1980  . AMPUTATION Left 12/12/2017   Procedure: AMPUTATION BELOW KNEE;  Surgeon: Katha Cabal, MD;  Location: [facility] ORS;  Service: Vascular;  Laterality: Left;  . AMPUTATION Left 02/08/2018   Procedure: AMPUTATION BELOW KNEE ( REVISION );  Surgeon: Katha Cabal, MD;  Location: [facility] ORS;  Service: Vascular;  Laterality: Left;  . APPENDECTOMY  1956  . APPLICATION OF WOUND VAC Left 01/25/2018   Procedure: APPLICATION OF WOUND VAC;  Surgeon: Katha Cabal, MD;  Location: [facility] ORS;  Service: Vascular;   Laterality: Left;  . BACK SURGERY  2013   Spinal fusion with rods and screws  . CHOLECYSTECTOMY    . COLONOSCOPY WITH PROPOFOL N/A 01/29/2017   Procedure: COLONOSCOPY WITH PROPOFOL;  Surgeon: Manya Silvas, MD;  Location: [facility] ENDOSCOPY;  Service: Endoscopy;  Laterality: N/A;  . ESOPHAGOGASTRODUODENOSCOPY (EGD) WITH PROPOFOL N/A 01/29/2017   Procedure: ESOPHAGOGASTRODUODENOSCOPY (EGD) WITH PROPOFOL;  Surgeon: Manya Silvas, MD;  Location: [facility] ENDOSCOPY;  Service: Endoscopy;  Laterality: N/A;  . EYE SURGERY Bilateral 2012   Cataract Extraction with IOL  . KNEE ARTHROSCOPY Left 2009   partial lateral meniscectomy, debridement, excision of Baker's cyst  . LEFT HEART CATH AND CORONARY ANGIOGRAPHY N/A 04/23/2017   Procedure: LEFT HEART CATH AND CORONARY ANGIOGRAPHY;  Surgeon: Teodoro Spray, MD;  Location: [place] CV LAB;  Service: Cardiovascular;  Laterality: N/A;  . LEFT HEART CATH AND CORONARY ANGIOGRAPHY Left 09/07/2017   Procedure: LEFT HEART CATH AND CORONARY ANGIOGRAPHY;  Surgeon: Teodoro Spray, MD;  Location: [place] CV LAB;  Service: Cardiovascular;  Laterality: Left;  . LOWER EXTREMITY ANGIOGRAPHY Left 11/27/2017   Procedure: LOWER EXTREMITY ANGIOGRAPHY;  Surgeon: Katha Cabal, MD;  Location: [place] CV LAB;  Service: Cardiovascular;  Laterality: Left;  . LOWER EXTREMITY ANGIOGRAPHY Left 12/05/2017   Procedure: LOWER EXTREMITY ANGIOGRAPHY;  Surgeon:  Schnier, Dolores Lory, MD;  Location: [place] [facility] LAB;  Service: Cardiovascular;  Laterality: Left;  . ROTATOR CUFF REPAIR Right 1990  . TOTAL KNEE ARTHROPLASTY Left 04/04/2016   Procedure: TOTAL KNEE ARTHROPLASTY;  Surgeon: Hessie Knows, MD;  Location: [facility] ORS;  Service: Orthopedics;  Laterality: Left;  . WOUND DEBRIDEMENT Left 01/25/2018   Procedure: DEBRIDEMENT WOUND;  Surgeon: Katha Cabal, MD;  Location: [facility] ORS;  Service: Vascular;  Laterality: Left;      Allergies  Allergen Reactions  .  Lodine [Etodolac] Hives and Itching  . Methotrexate Derivatives Other (See Comments)    Flu like symptoms  . Nitrofuran Derivatives Nausea And Vomiting and Other (See Comments)    Made sicker   . Levaquin [Levofloxacin] Itching, Rash and Other (See Comments)    Muscle pain  . Norvasc [Amlodipine] Other (See Comments)    Unknown   . Pentasa [Mesalamine Er] Other (See Comments)    Unknown   . Sulfa Antibiotics Itching and Rash  . Vioxx [Rofecoxib] Other (See Comments)    Unknown     Current Outpatient Medications  Medication Sig Dispense Refill  . acetaminophen (TYLENOL) 500 MG tablet Take 1,000 mg by mouth every 8 (eight) hours as needed for mild pain or headache.    Marland Kitchen apixaban (ELIQUIS) 5 MG TABS tablet Take 1 tablet (5 mg total) by mouth 2 (two) times daily. 60 tablet   . aspirin EC 81 MG tablet Take 1 tablet (81 mg total) by mouth daily.    . budesonide (ENTOCORT EC) 3 MG 24 hr capsule Take 3 capsules (9 mg total) by mouth daily. Taper down to 3 mg 30 capsule 0  . Calcium Carbonate-Vitamin D (CALCIUM 600+D) 600-400 MG-UNIT tablet Take 1 tablet by mouth 2 (two) times daily.     . clopidogrel (PLAVIX) 75 MG tablet TAKE 1 TABLET BY MOUTH EVERY DAY (Patient not taking: Reported on 02/27/2018) 90 tablet 1  . Cranberry 500 MG CAPS Take 500 mg by mouth daily.    Marland Kitchen diltiazem (CARDIZEM CD) 240 MG 24 hr capsule Take 240 mg by mouth daily.    . feeding supplement, ENSURE ENLIVE, (ENSURE ENLIVE) LIQD Take 237 mLs by mouth 2 (two) times daily between meals. 237 mL 12  . ferrous sulfate 325 (65 FE) MG tablet Take 325 mg by mouth daily with breakfast.    . gabapentin (NEURONTIN) 300 MG capsule TAKE ONE CAPSULE BY MOUTH EVERY DAY USE THIS MEDICATION FIRST 30 capsule 0  . gemfibrozil (LOPID) 600 MG tablet Take 600 mg by mouth 2 (two) times daily before a meal.    . hydroxychloroquine (PLAQUENIL) 200 MG tablet Take 200 mg by mouth 2 (two) times daily.     Marland Kitchen leflunomide (ARAVA) 20 MG tablet Take 20 mg  by mouth daily.    Marland Kitchen lisinopril (PRINIVIL,ZESTRIL) 10 MG tablet Take 10 mg by mouth daily.    Marland Kitchen LORazepam (ATIVAN) 1 MG tablet Take 1 tablet (1 mg total) by mouth 2 (two) times daily. 20 tablet 0  . Melatonin 3 MG TABS Take by mouth at bedtime.    . Multiple Vitamin (MULTIVITAMIN) tablet Take 1 tablet by mouth daily.    . Multiple Vitamins-Minerals (PRESERVISION AREDS) CAPS Take 1 capsule by mouth 2 (two) times daily.    Marland Kitchen omeprazole (PRILOSEC) 20 MG capsule Take 20 mg by mouth 2 (two) times daily before a meal.     . oxyCODONE (OXY IR/ROXICODONE) 5 MG immediate release tablet Take 1  tablet (5 mg total) by mouth every 6 (six) hours as needed for severe pain. 28 tablet 0  . pravastatin (PRAVACHOL) 80 MG tablet Take 80 mg by mouth at bedtime.     . predniSONE (DELTASONE) 5 MG tablet Take 5 mg by mouth daily with breakfast.     . sertraline (ZOLOFT) 50 MG tablet Take 50 mg by mouth at bedtime.     Marland Kitchen tiZANidine (ZANAFLEX) 4 MG tablet Take 4 mg by mouth daily as needed for muscle spasms (neck pain).     . traMADol (ULTRAM) 50 MG tablet Take 1 tablet (50 mg total) by mouth every 6 (six) hours as needed for moderate pain. 28 tablet 2   No current facility-administered medications for this visit.         Physical Exam There were no vitals taken for this visit. Gen:  WD/WN, NAD Skin: incision necrotic; wound measurements are as follows 8 cm in width by 3 cm in length by 2 cm in depth Lung: clear to auscultation bilaterally CV:  irreg irreg, + murmur ABD:  Soft and nontender ENT: hearing intact, sclera nonicteric, oropharynx clear     Assessment/Plan: 1. Pressure ulcer of BKA stump, unstageable ([place]) Will need debridement of the left below-knee amputation stump in the operating room and VAC placement, possible AKA was discussed but I will try wound care first.  wound measurements are as follows 8 cm in width by 3 cm in length by 2 cm in depth  She is tolerating a regular diet and does  not have nutritional issues at this time  Risk and benefits were reviewed the patient.  Indications for the procedure were reviewed.  All questions were answered, the patient agrees to proceed.       [name] 03/07/2018, 8:57 AM   This note was created with Dragon medical transcription system.  Any errors from dictation are unintentional.

## 2018-03-08 ENCOUNTER — Ambulatory Visit: Payer: Medicare HMO

## 2018-03-08 ENCOUNTER — Ambulatory Visit
Admission: RE | Admit: 2018-03-08 | Discharge: 2018-03-08 | Disposition: A | Discharge status: Home / Self Care | Payer: Medicare HMO | Source: Ambulatory Visit | Attending: Vascular Surgery | Admitting: Vascular Surgery

## 2018-03-08 ENCOUNTER — Encounter
Admission: RE | Disposition: A | Discharge status: Home / Self Care | Payer: Self-pay | Source: Ambulatory Visit | Attending: Vascular Surgery

## 2018-03-08 ENCOUNTER — Other Ambulatory Visit: Payer: Self-pay

## 2018-03-08 DIAGNOSIS — I4891 Unspecified atrial fibrillation: Secondary | ICD-10-CM | POA: Insufficient documentation

## 2018-03-08 DIAGNOSIS — F329 Major depressive disorder, single episode, unspecified: Secondary | ICD-10-CM | POA: Insufficient documentation

## 2018-03-08 DIAGNOSIS — I7300 Raynaud's syndrome without gangrene: Secondary | ICD-10-CM | POA: Diagnosis not present

## 2018-03-08 DIAGNOSIS — E7800 Pure hypercholesterolemia, unspecified: Secondary | ICD-10-CM | POA: Insufficient documentation

## 2018-03-08 DIAGNOSIS — J449 Chronic obstructive pulmonary disease, unspecified: Secondary | ICD-10-CM | POA: Insufficient documentation

## 2018-03-08 DIAGNOSIS — T8781 Dehiscence of amputation stump: Secondary | ICD-10-CM | POA: Insufficient documentation

## 2018-03-08 DIAGNOSIS — I2510 Atherosclerotic heart disease of native coronary artery without angina pectoris: Secondary | ICD-10-CM | POA: Diagnosis not present

## 2018-03-08 DIAGNOSIS — F419 Anxiety disorder, unspecified: Secondary | ICD-10-CM | POA: Diagnosis not present

## 2018-03-08 DIAGNOSIS — I7025 Atherosclerosis of native arteries of other extremities with ulceration: Secondary | ICD-10-CM | POA: Diagnosis not present

## 2018-03-08 DIAGNOSIS — T8789 Other complications of amputation stump: Secondary | ICD-10-CM | POA: Diagnosis not present

## 2018-03-08 DIAGNOSIS — Z7982 Long term (current) use of aspirin: Secondary | ICD-10-CM | POA: Insufficient documentation

## 2018-03-08 DIAGNOSIS — F418 Other specified anxiety disorders: Secondary | ICD-10-CM | POA: Diagnosis not present

## 2018-03-08 DIAGNOSIS — E785 Hyperlipidemia, unspecified: Secondary | ICD-10-CM | POA: Diagnosis not present

## 2018-03-08 DIAGNOSIS — Z79899 Other long term (current) drug therapy: Secondary | ICD-10-CM | POA: Diagnosis not present

## 2018-03-08 DIAGNOSIS — I10 Essential (primary) hypertension: Secondary | ICD-10-CM | POA: Insufficient documentation

## 2018-03-08 DIAGNOSIS — Y838 Other surgical procedures as the cause of abnormal reaction of the patient, or of later complication, without mention of misadventure at the time of the procedure: Secondary | ICD-10-CM | POA: Diagnosis not present

## 2018-03-08 DIAGNOSIS — T8130XA Disruption of wound, unspecified, initial encounter: Secondary | ICD-10-CM

## 2018-03-08 DIAGNOSIS — Z7902 Long term (current) use of antithrombotics/antiplatelets: Secondary | ICD-10-CM | POA: Diagnosis not present

## 2018-03-08 HISTORY — PX: APPLICATION OF WOUND VAC: SHX5189

## 2018-03-08 HISTORY — PX: WOUND DEBRIDEMENT: SHX247

## 2018-03-08 LAB — CBC
HCT: 32.2 % — ABNORMAL LOW (ref 35.0–47.0)
HEMOGLOBIN: 10.9 g/dL — AB (ref 12.0–16.0)
MCH: 31.7 pg (ref 26.0–34.0)
MCHC: 33.9 g/dL (ref 32.0–36.0)
MCV: 93.6 fL (ref 80.0–100.0)
Platelets: 290 10*3/uL (ref 150–440)
RBC: 3.44 MIL/uL — AB (ref 3.80–5.20)
RDW: 15.9 % — ABNORMAL HIGH (ref 11.5–14.5)
WBC: 11 10*3/uL (ref 3.6–11.0)

## 2018-03-08 LAB — COMPREHENSIVE METABOLIC PANEL
ALK PHOS: 58 U/L (ref 38–126)
ALT: 14 U/L (ref 0–44)
AST: 21 U/L (ref 15–41)
Albumin: 3.9 g/dL (ref 3.5–5.0)
Anion gap: 9 (ref 5–15)
BUN: 31 mg/dL — ABNORMAL HIGH (ref 8–23)
CALCIUM: 9.2 mg/dL (ref 8.9–10.3)
CO2: 25 mmol/L (ref 22–32)
CREATININE: 0.5 mg/dL (ref 0.44–1.00)
Chloride: 106 mmol/L (ref 98–111)
Glucose, Bld: 129 mg/dL — ABNORMAL HIGH (ref 70–99)
Potassium: 3.9 mmol/L (ref 3.5–5.1)
Sodium: 140 mmol/L (ref 135–145)
Total Bilirubin: 0.8 mg/dL (ref 0.3–1.2)
Total Protein: 7.2 g/dL (ref 6.5–8.1)

## 2018-03-08 LAB — APTT: aPTT: 31 seconds (ref 24–36)

## 2018-03-08 SURGERY — DEBRIDEMENT, WOUND
Anesthesia: General | Laterality: Left | Wound class: Dirty or Infected

## 2018-03-08 MED ORDER — DEXAMETHASONE SODIUM PHOSPHATE 10 MG/ML IJ SOLN
INTRAMUSCULAR | Status: DC | PRN
  Administered 2018-03-08: 5 mg via INTRAVENOUS

## 2018-03-08 MED ORDER — ONDANSETRON HCL 4 MG/2ML IJ SOLN
INTRAMUSCULAR | Status: AC
  Filled 2018-03-08: qty 2

## 2018-03-08 MED ORDER — FENTANYL CITRATE (PF) 100 MCG/2ML IJ SOLN
INTRAMUSCULAR | Status: AC
  Administered 2018-03-08: 25 ug via INTRAVENOUS
  Filled 2018-03-08: qty 2

## 2018-03-08 MED ORDER — HYDROCODONE-ACETAMINOPHEN 5-325 MG PO TABS: 1.0000 | ORAL_TABLET | Freq: Four times a day (QID) | ORAL | 0 refills | Status: DC | PRN

## 2018-03-08 MED ORDER — DEXAMETHASONE SODIUM PHOSPHATE 10 MG/ML IJ SOLN
INTRAMUSCULAR | Status: AC
  Filled 2018-03-08: qty 1

## 2018-03-08 MED ORDER — KETOROLAC TROMETHAMINE 30 MG/ML IJ SOLN
INTRAMUSCULAR | Status: AC
  Filled 2018-03-08: qty 1

## 2018-03-08 MED ORDER — GLYCOPYRROLATE 0.2 MG/ML IJ SOLN
INTRAMUSCULAR | Status: AC
  Filled 2018-03-08: qty 1

## 2018-03-08 MED ORDER — FENTANYL CITRATE (PF) 100 MCG/2ML IJ SOLN
25.0000 ug | INTRAMUSCULAR | Status: AC | PRN
  Administered 2018-03-08 (×6): 25 ug via INTRAVENOUS

## 2018-03-08 MED ORDER — OXYCODONE HCL 5 MG PO TABS: 5.0000 mg | ORAL_TABLET | Freq: Once | ORAL | Status: DC | PRN

## 2018-03-08 MED ORDER — PROPOFOL 10 MG/ML IV BOLUS
INTRAVENOUS | Status: DC | PRN
  Administered 2018-03-08: 100 mg via INTRAVENOUS
  Administered 2018-03-08: 30 mg via INTRAVENOUS

## 2018-03-08 MED ORDER — FENTANYL CITRATE (PF) 100 MCG/2ML IJ SOLN
INTRAMUSCULAR | Status: DC | PRN
  Administered 2018-03-08 (×2): 25 ug via INTRAVENOUS

## 2018-03-08 MED ORDER — LACTATED RINGERS IV SOLN
INTRAVENOUS | Status: DC | PRN
  Administered 2018-03-08: 11:00:00 via INTRAVENOUS

## 2018-03-08 MED ORDER — SUGAMMADEX SODIUM 200 MG/2ML IV SOLN
INTRAVENOUS | Status: AC
  Filled 2018-03-08: qty 2

## 2018-03-08 MED ORDER — CEFAZOLIN SODIUM-DEXTROSE 1-4 GM/50ML-% IV SOLN
INTRAVENOUS | Status: AC
  Filled 2018-03-08: qty 50

## 2018-03-08 MED ORDER — ONDANSETRON HCL 4 MG/2ML IJ SOLN
INTRAMUSCULAR | Status: DC | PRN
  Administered 2018-03-08: 4 mg via INTRAVENOUS

## 2018-03-08 MED ORDER — BUPIVACAINE-EPINEPHRINE (PF) 0.5% -1:200000 IJ SOLN
INTRAMUSCULAR | Status: AC
  Filled 2018-03-08: qty 30

## 2018-03-08 MED ORDER — BUPIVACAINE HCL 0.5 % IJ SOLN
INTRAMUSCULAR | Status: DC | PRN
  Administered 2018-03-08: 8 mL

## 2018-03-08 MED ORDER — OXYCODONE HCL 5 MG/5ML PO SOLN: 5.0000 mg | Freq: Once | ORAL | Status: DC | PRN

## 2018-03-08 MED ORDER — ROCURONIUM BROMIDE 50 MG/5ML IV SOLN
INTRAVENOUS | Status: AC
  Filled 2018-03-08: qty 1

## 2018-03-08 MED ORDER — DIPHENHYDRAMINE HCL 50 MG/ML IJ SOLN
INTRAMUSCULAR | Status: AC
  Filled 2018-03-08: qty 1

## 2018-03-08 MED ORDER — FENTANYL CITRATE (PF) 100 MCG/2ML IJ SOLN
INTRAMUSCULAR | Status: AC
  Filled 2018-03-08: qty 2

## 2018-03-08 MED ORDER — SODIUM CHLORIDE 0.9 % IV SOLN: INTRAVENOUS | Status: DC

## 2018-03-08 SURGICAL SUPPLY — 38 items
BRUSH SCRUB EZ  4% CHG (MISCELLANEOUS) ×2
BRUSH SCRUB EZ 4% CHG (MISCELLANEOUS) ×1 IMPLANT
CANISTER SUCT 1200ML W/VALVE (MISCELLANEOUS) ×3 IMPLANT
CHLORAPREP W/TINT 26ML (MISCELLANEOUS) ×3 IMPLANT
DRAPE INCISE IOBAN 66X45 STRL (DRAPES) ×3 IMPLANT
DRAPE LAPAROTOMY 100X77 ABD (DRAPES) ×3 IMPLANT
DRSG MEPITEL 4X7.2 (GAUZE/BANDAGES/DRESSINGS) ×3 IMPLANT
DRSG VAC ATS MED SENSATRAC (GAUZE/BANDAGES/DRESSINGS) ×3 IMPLANT
ELECT CAUTERY BLADE 6.4 (BLADE) ×3 IMPLANT
ELECT REM PT RETURN 9FT ADLT (ELECTROSURGICAL) ×3
ELECTRODE REM PT RTRN 9FT ADLT (ELECTROSURGICAL) ×1 IMPLANT
GLOVE BIO SURGEON STRL SZ7 (GLOVE) ×3 IMPLANT
GOWN STRL REUS W/ TWL LRG LVL3 (GOWN DISPOSABLE) ×1 IMPLANT
GOWN STRL REUS W/ TWL XL LVL3 (GOWN DISPOSABLE) ×1 IMPLANT
GOWN STRL REUS W/TWL LRG LVL3 (GOWN DISPOSABLE) ×2
GOWN STRL REUS W/TWL XL LVL3 (GOWN DISPOSABLE) ×2
IV NS 1000ML (IV SOLUTION) ×2
IV NS 1000ML BAXH (IV SOLUTION) ×1 IMPLANT
KIT TURNOVER KIT A (KITS) ×3 IMPLANT
LABEL OR SOLS (LABEL) ×3 IMPLANT
NS IRRIG 1000ML POUR BTL (IV SOLUTION) ×3 IMPLANT
NS IRRIG 500ML POUR BTL (IV SOLUTION) ×3 IMPLANT
PACK BASIN MINOR ARMC (MISCELLANEOUS) ×3 IMPLANT
PACK EXTREMITY ARMC (MISCELLANEOUS) ×3 IMPLANT
PAD PREP 24X41 OB/GYN DISP (PERSONAL CARE ITEMS) ×3 IMPLANT
PULSAVAC PLUS IRRIG FAN TIP (DISPOSABLE) ×3
SOL PREP PVP 2OZ (MISCELLANEOUS) ×3
SOLUTION PREP PVP 2OZ (MISCELLANEOUS) ×1 IMPLANT
SPONGE LAP 18X18 RF (DISPOSABLE) ×3 IMPLANT
SUT ETHILON 4-0 (SUTURE) ×2
SUT ETHILON 4-0 FS2 18XMFL BLK (SUTURE) ×1
SUT VIC AB 3-0 SH 27 (SUTURE) ×4
SUT VIC AB 3-0 SH 27X BRD (SUTURE) ×2 IMPLANT
SUTURE ETHLN 4-0 FS2 18XMF BLK (SUTURE) ×1 IMPLANT
SWAB CULTURE AMIES ANAERIB BLU (MISCELLANEOUS) ×3 IMPLANT
SYR BULB 3OZ (MISCELLANEOUS) ×3 IMPLANT
TIP FAN IRRIG PULSAVAC PLUS (DISPOSABLE) ×1 IMPLANT
WND VAC CANISTER 500ML (MISCELLANEOUS) ×3 IMPLANT

## 2018-03-08 NOTE — Op Note (Signed)
    OPERATIVE NOTE   PROCEDURE: Excisional debridement left below-knee amputation stump  PRE-OPERATIVE DIAGNOSIS: Wound dehiscence left below-knee amputation stump   POST-OPERATIVE DIAGNOSIS: Same  SURGEON: [name]  ASSISTANT(S): None  ANESTHESIA: general  ESTIMATED BLOOD LOSS: Less than 5 cc  FINDING(S): Necrosis of skin subcutaneous tissue some muscle and tendon  SPECIMEN(S): Debrided tissue was not submitted for permanent section deep wound culture was obtained and sent to microbiology  INDICATIONS:   Melissa [name] is a 77 y.o. female who presents with dehiscence with necrosis of her below-knee amputation stump on the left.  Debridement in the operating room is recommended and VAC placement will be performed.  Risks and benefits were reviewed all questions answered patient agrees to proceed.  DESCRIPTION: After full informed written consent was obtained from the patient, the patient was brought back to the operating room and placed supine upon the operating table.  Prior to induction, the patient received IV antibiotics.   After obtaining adequate anesthesia, the patient was then prepped and draped in the standard fashion for a excisional debridement of left below-knee amputation stump.  Initially the remaining nylon sutures were removed.  Subsequently 10 cc of quarter percent Marcaine without epinephrine was infiltrated into the soft tissues.  Beginning with a 15 blade scalpel and later using Metzenbaum scissors necrotic skin subcutaneous tissues tendon and muscle was debrided.  Deep wound culture was then obtained and passed off the field and sent to microbiology.  Hemostasis was then obtained with Bovie cautery.  A liter was then used to irrigate the wound with a pulse lavage.  Wound was reinspected for hemostasis and then a VAC dressing was placed.  Inspection now reveals that the tibia remains covered the area of deepest tissue loss is located medially but there is  still some muscle covering the bone.  Anteriorly there appears to be adequate tissue covering the bone as well.  The wound itself measures 10-1/2 cm in length by 2 cm in width by 1/2 cm in depth.   The patient tolerated this procedure well.   COMPLICATIONS: None  CONDITION: Melissa [name] [facility]  Office: [phone]   03/08/2018, 12:36 PM

## 2018-03-08 NOTE — Anesthesia Postprocedure Evaluation (Signed)
Anesthesia Post Note  Patient: Melissa [name]  Procedure(s) Performed: DEBRIDEMENT WOUND (Left ) APPLICATION OF WOUND VAC (Left )  Patient location during evaluation: PACU Anesthesia Type: General Level of consciousness: awake and alert Pain management: pain level controlled Vital Signs Assessment: post-procedure vital signs reviewed and stable Respiratory status: spontaneous breathing, nonlabored ventilation, respiratory function stable and patient connected to nasal cannula oxygen Cardiovascular status: blood pressure returned to baseline and stable Postop Assessment: no apparent nausea or vomiting Anesthetic complications: no     Last Vitals:  Vitals:   03/08/18 1328 03/08/18 1342  BP: 138/81 135/62  Pulse: 73 86  Resp: 16 16  Temp: 36.6 C   SpO2: 96% 97%    Last Pain:  Vitals:   03/08/18 1342  TempSrc:   PainSc: [place]

## 2018-03-08 NOTE — Anesthesia Procedure Notes (Signed)
Procedure Name: LMA Insertion Date/Time: 03/08/2018 11:40 AM Performed by: Durenda Hurt, MD Pre-anesthesia Checklist: Patient identified, Emergency Drugs available, Suction available, Patient being monitored and Timeout performed Patient Re-evaluated:Patient Re-evaluated prior to induction Oxygen Delivery Method: Circle system utilized Preoxygenation: Pre-oxygenation with 100% oxygen Induction Type: IV induction Ventilation: Mask ventilation without difficulty and Oral airway inserted - appropriate to patient size LMA: LMA inserted LMA Size: 3.0 Number of attempts: 1 Dental Injury: Teeth and Oropharynx as per pre-operative assessment

## 2018-03-08 NOTE — OR Nursing (Signed)
Dr Ola Spurr asked to review patient chart and hx, no new orders.  Patient reports no diarrhea in two days.

## 2018-03-08 NOTE — Discharge Instructions (Addendum)

## 2018-03-08 NOTE — H&P (Signed)
[facility] History & Physical Update  The patient was interviewed and re-examined.  The patient's previous History and Physical has been reviewed and is unchanged.  There is no change in the plan of care. We plan to proceed with the scheduled procedure.  [name], MD  03/08/2018, 10:59 AM

## 2018-03-08 NOTE — Transfer of Care (Signed)
Immediate Anesthesia Transfer of Care Note  Patient: Melissa [name]  Procedure(s) Performed: DEBRIDEMENT WOUND (Left ) APPLICATION OF WOUND VAC (Left )  Patient Location: PACU  Anesthesia Type:General  Level of Consciousness: drowsy and patient cooperative  Airway & Oxygen Therapy: Patient Spontanous Breathing and Patient connected to face mask oxygen  Post-op Assessment: Report given to RN and Post -op Vital signs reviewed and stable  Post vital signs: Reviewed and stable  Last Vitals:  Vitals Value Taken Time  BP 163/79 03/08/2018 12:35 PM  Temp    Pulse    Resp 11 03/08/2018 12:36 PM  SpO2    Vitals shown include unvalidated device data.  Last Pain:  Vitals:   03/08/18 0916  TempSrc: Temporal  PainSc: 0-No pain         Complications: No apparent anesthesia complications

## 2018-03-08 NOTE — Anesthesia Post-op Follow-up Note (Signed)
Anesthesia QCDR form completed.        

## 2018-03-08 NOTE — Anesthesia Preprocedure Evaluation (Addendum)
Anesthesia Evaluation  Patient identified by MRN, date of birth, ID band Patient awake    Reviewed: Allergy & Precautions, H&P , NPO status , Patient's Chart, lab work & pertinent test results  Airway Mallampati: III  TM Distance: >3 FB Neck ROM: full    Dental   Pulmonary neg pulmonary ROS, shortness of breath, COPD,           Cardiovascular hypertension, + CAD and + Peripheral Vascular Disease  negative cardio ROS  + dysrhythmias   H/o demand ischemia in the setting of Afib with RVR; cath showed occluded RCA without significant left system disease.   Neuro/Psych PSYCHIATRIC DISORDERS Anxiety Depression negative neurological ROS  negative psych ROS   GI/Hepatic negative GI ROS, Neg liver ROS, GERD  ,  Endo/Other  negative endocrine ROS  Renal/GU negative Renal ROS  negative genitourinary   Musculoskeletal  (+) Arthritis ,   Abdominal   Peds  Hematology negative hematology ROS (+) anemia ,   Anesthesia Other Findings Past Medical History: 12/2017: Anemia     Comment:  blood transfusion after amputation No date: Anxiety No date: Arthritis     Comment:  rheumatoid No date: Ataxia 2019: Chronic ulcer of right great toe (HCC)     Comment:  slowly healing No date: COPD (chronic obstructive pulmonary disease) ([place]) 2018: Coronary artery disease     Comment:  complete blockage in one vessel, 40 % in another, 25% in              3rd vessell No date: Depression No date: Diverticulosis No date: Dysrhythmia     Comment:  atrial fib No date: Excessive falling No date: GERD (gastroesophageal reflux disease)     Comment:  ulcerative colitis No date: Hypercholesteremia No date: Hyperlipidemia No date: Hypertension No date: IBS (irritable bowel syndrome) 11/2017: MRSA (methicillin resistant staph aureus) culture positive     Comment:  patient unaware of this diagnosis, was never informed or               treated 08/2017: Myocardial infarction [facility])     Comment:  had angio but no stents No date: Osteoarthritis No date: Psoriasis No date: Raynaud's disease without gangrene No date: Raynaud's disease without gangrene No date: Shortness of breath dyspnea     Comment:  with exertion 12/2017: Transfusion history     Comment:  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)  Past Surgical History: 1980: ABDOMINAL HYSTERECTOMY 12/12/2017: AMPUTATION; Left     Comment:  Procedure: AMPUTATION BELOW KNEE;  Surgeon: Katha Cabal, MD;  Location: [facility] ORS;  Service: Vascular;                Laterality: Left; 02/08/2018: AMPUTATION; Left     Comment:  Procedure: AMPUTATION BELOW KNEE ( REVISION );  Surgeon:              Katha Cabal, MD;  Location: [facility] ORS;  Service:               Vascular;  Laterality: Left; 1956: APPENDECTOMY 3/64/6803: APPLICATION OF WOUND VAC; Left     Comment:  Procedure: APPLICATION OF WOUND VAC;  Surgeon: Katha Cabal, MD;  Location: [facility] ORS;  Service: Vascular;                Laterality: Left; 2013:  BACK SURGERY     Comment:  Spinal fusion with rods and screws No date: CHOLECYSTECTOMY 01/29/2017: COLONOSCOPY WITH PROPOFOL; N/A     Comment:  Procedure: COLONOSCOPY WITH PROPOFOL;  Surgeon: Manya Silvas, MD;  Location: [facility]COPY;  Service:               Endoscopy;  Laterality: N/A; 01/29/2017: ESOPHAGOGASTRODUODENOSCOPY (EGD) WITH PROPOFOL; N/A     Comment:  Procedure: ESOPHAGOGASTRODUODENOSCOPY (EGD) WITH               PROPOFOL;  Surgeon: Manya Silvas, MD;  Location:               [facility] ENDOSCOPY;  Service: Endoscopy;  Laterality: N/A; 2012: EYE SURGERY; Bilateral     Comment:  Cataract Extraction with IOL 2009: KNEE ARTHROSCOPY; Left     Comment:  partial lateral meniscectomy, debridement, excision of               Baker's cyst 04/23/2017: LEFT HEART CATH AND CORONARY ANGIOGRAPHY; N/A     Comment:   Procedure: LEFT HEART CATH AND CORONARY ANGIOGRAPHY;                Surgeon: Teodoro Spray, MD;  Location: [place] CV              LAB;  Service: Cardiovascular;  Laterality: N/A; 09/07/2017: LEFT HEART CATH AND CORONARY ANGIOGRAPHY; Left     Comment:  Procedure: LEFT HEART CATH AND CORONARY ANGIOGRAPHY;                Surgeon: Teodoro Spray, MD;  Location: [place] CV              LAB;  Service: Cardiovascular;  Laterality: Left; 11/27/2017: LOWER EXTREMITY ANGIOGRAPHY; Left     Comment:  Procedure: LOWER EXTREMITY ANGIOGRAPHY;  Surgeon:               Katha Cabal, MD;  Location: [place] CV LAB;               Service: Cardiovascular;  Laterality: Left; 12/05/2017: LOWER EXTREMITY ANGIOGRAPHY; Left     Comment:  Procedure: LOWER EXTREMITY ANGIOGRAPHY;  Surgeon:               Katha Cabal, MD;  Location: [place] CV LAB;               Service: Cardiovascular;  Laterality: Left; 1990: ROTATOR CUFF REPAIR; Right 04/04/2016: TOTAL KNEE ARTHROPLASTY; Left     Comment:  Procedure: TOTAL KNEE ARTHROPLASTY;  Surgeon: Hessie Knows, MD;  Location: [facility] ORS;  Service: Orthopedics;                Laterality: Left; 01/25/2018: WOUND DEBRIDEMENT; Left     Comment:  Procedure: DEBRIDEMENT WOUND;  Surgeon: Katha Cabal, MD;  Location: [facility] ORS;  Service: Vascular;                Laterality: Left;  BMI    Body Mass Index:  18.88 kg/m      Reproductive/Obstetrics negative OB ROS  Anesthesia Physical Anesthesia Plan  ASA: III  Anesthesia Plan: General and General LMA   Post-op Pain Management:    Induction:   PONV Risk Score and Plan:   Airway Management Planned:   Additional Equipment:   Intra-op Plan:   Post-operative Plan:   Informed Consent: I have reviewed the patients History and Physical, chart, labs and discussed the procedure including the risks, benefits and  alternatives for the proposed anesthesia with the patient or authorized representative who has indicated his/her understanding and acceptance.   Dental Advisory Given  Plan Discussed with: Anesthesiologist, CRNA and Surgeon  Anesthesia Plan Comments:         Anesthesia Quick Evaluation

## 2018-03-09 ENCOUNTER — Encounter: Payer: Self-pay | Admitting: Vascular Surgery

## 2018-03-11 DIAGNOSIS — D649 Anemia, unspecified: Secondary | ICD-10-CM | POA: Diagnosis not present

## 2018-03-11 DIAGNOSIS — I7090 Unspecified atherosclerosis: Secondary | ICD-10-CM | POA: Diagnosis not present

## 2018-03-11 DIAGNOSIS — J449 Chronic obstructive pulmonary disease, unspecified: Secondary | ICD-10-CM | POA: Diagnosis not present

## 2018-03-11 DIAGNOSIS — K5790 Diverticulosis of intestine, part unspecified, without perforation or abscess without bleeding: Secondary | ICD-10-CM | POA: Diagnosis not present

## 2018-03-11 DIAGNOSIS — M069 Rheumatoid arthritis, unspecified: Secondary | ICD-10-CM | POA: Diagnosis not present

## 2018-03-11 DIAGNOSIS — I10 Essential (primary) hypertension: Secondary | ICD-10-CM | POA: Diagnosis not present

## 2018-03-11 DIAGNOSIS — T8789 Other complications of amputation stump: Secondary | ICD-10-CM | POA: Diagnosis not present

## 2018-03-11 DIAGNOSIS — Z89512 Acquired absence of left leg below knee: Secondary | ICD-10-CM | POA: Diagnosis not present

## 2018-03-11 DIAGNOSIS — I7300 Raynaud's syndrome without gangrene: Secondary | ICD-10-CM | POA: Diagnosis not present

## 2018-03-12 DIAGNOSIS — I7300 Raynaud's syndrome without gangrene: Secondary | ICD-10-CM | POA: Diagnosis not present

## 2018-03-12 DIAGNOSIS — M359 Systemic involvement of connective tissue, unspecified: Secondary | ICD-10-CM | POA: Diagnosis not present

## 2018-03-12 DIAGNOSIS — T8789 Other complications of amputation stump: Secondary | ICD-10-CM | POA: Diagnosis not present

## 2018-03-12 DIAGNOSIS — Z79899 Other long term (current) drug therapy: Secondary | ICD-10-CM | POA: Diagnosis not present

## 2018-03-12 DIAGNOSIS — I7025 Atherosclerosis of native arteries of other extremities with ulceration: Secondary | ICD-10-CM | POA: Diagnosis not present

## 2018-03-13 DIAGNOSIS — D649 Anemia, unspecified: Secondary | ICD-10-CM | POA: Diagnosis not present

## 2018-03-13 DIAGNOSIS — I10 Essential (primary) hypertension: Secondary | ICD-10-CM | POA: Diagnosis not present

## 2018-03-13 DIAGNOSIS — I7090 Unspecified atherosclerosis: Secondary | ICD-10-CM | POA: Diagnosis not present

## 2018-03-13 DIAGNOSIS — K5790 Diverticulosis of intestine, part unspecified, without perforation or abscess without bleeding: Secondary | ICD-10-CM | POA: Diagnosis not present

## 2018-03-13 DIAGNOSIS — Z89512 Acquired absence of left leg below knee: Secondary | ICD-10-CM | POA: Diagnosis not present

## 2018-03-13 DIAGNOSIS — T8789 Other complications of amputation stump: Secondary | ICD-10-CM | POA: Diagnosis not present

## 2018-03-13 DIAGNOSIS — J449 Chronic obstructive pulmonary disease, unspecified: Secondary | ICD-10-CM | POA: Diagnosis not present

## 2018-03-13 DIAGNOSIS — M069 Rheumatoid arthritis, unspecified: Secondary | ICD-10-CM | POA: Diagnosis not present

## 2018-03-13 DIAGNOSIS — I7300 Raynaud's syndrome without gangrene: Secondary | ICD-10-CM | POA: Diagnosis not present

## 2018-03-13 LAB — AEROBIC/ANAEROBIC CULTURE W GRAM STAIN (SURGICAL/DEEP WOUND)

## 2018-03-13 LAB — AEROBIC/ANAEROBIC CULTURE (SURGICAL/DEEP WOUND): GRAM STAIN: NONE SEEN

## 2018-03-14 DIAGNOSIS — R197 Diarrhea, unspecified: Secondary | ICD-10-CM | POA: Diagnosis not present

## 2018-03-14 DIAGNOSIS — M810 Age-related osteoporosis without current pathological fracture: Secondary | ICD-10-CM | POA: Diagnosis not present

## 2018-03-14 DIAGNOSIS — K529 Noninfective gastroenteritis and colitis, unspecified: Secondary | ICD-10-CM | POA: Diagnosis not present

## 2018-03-14 DIAGNOSIS — D649 Anemia, unspecified: Secondary | ICD-10-CM | POA: Diagnosis not present

## 2018-03-15 DIAGNOSIS — I7300 Raynaud's syndrome without gangrene: Secondary | ICD-10-CM | POA: Diagnosis not present

## 2018-03-15 DIAGNOSIS — Z89512 Acquired absence of left leg below knee: Secondary | ICD-10-CM | POA: Diagnosis not present

## 2018-03-15 DIAGNOSIS — J449 Chronic obstructive pulmonary disease, unspecified: Secondary | ICD-10-CM | POA: Diagnosis not present

## 2018-03-15 DIAGNOSIS — D649 Anemia, unspecified: Secondary | ICD-10-CM | POA: Diagnosis not present

## 2018-03-15 DIAGNOSIS — M069 Rheumatoid arthritis, unspecified: Secondary | ICD-10-CM | POA: Diagnosis not present

## 2018-03-15 DIAGNOSIS — T8789 Other complications of amputation stump: Secondary | ICD-10-CM | POA: Diagnosis not present

## 2018-03-15 DIAGNOSIS — K5790 Diverticulosis of intestine, part unspecified, without perforation or abscess without bleeding: Secondary | ICD-10-CM | POA: Diagnosis not present

## 2018-03-15 DIAGNOSIS — I10 Essential (primary) hypertension: Secondary | ICD-10-CM | POA: Diagnosis not present

## 2018-03-15 DIAGNOSIS — I7090 Unspecified atherosclerosis: Secondary | ICD-10-CM | POA: Diagnosis not present

## 2018-03-16 DIAGNOSIS — T8789 Other complications of amputation stump: Secondary | ICD-10-CM | POA: Diagnosis not present

## 2018-03-16 DIAGNOSIS — D649 Anemia, unspecified: Secondary | ICD-10-CM | POA: Diagnosis not present

## 2018-03-16 DIAGNOSIS — I7090 Unspecified atherosclerosis: Secondary | ICD-10-CM | POA: Diagnosis not present

## 2018-03-16 DIAGNOSIS — M069 Rheumatoid arthritis, unspecified: Secondary | ICD-10-CM | POA: Diagnosis not present

## 2018-03-16 DIAGNOSIS — I7300 Raynaud's syndrome without gangrene: Secondary | ICD-10-CM | POA: Diagnosis not present

## 2018-03-16 DIAGNOSIS — J449 Chronic obstructive pulmonary disease, unspecified: Secondary | ICD-10-CM | POA: Diagnosis not present

## 2018-03-16 DIAGNOSIS — I10 Essential (primary) hypertension: Secondary | ICD-10-CM | POA: Diagnosis not present

## 2018-03-16 DIAGNOSIS — Z89512 Acquired absence of left leg below knee: Secondary | ICD-10-CM | POA: Diagnosis not present

## 2018-03-16 DIAGNOSIS — K5790 Diverticulosis of intestine, part unspecified, without perforation or abscess without bleeding: Secondary | ICD-10-CM | POA: Diagnosis not present

## 2018-03-18 DIAGNOSIS — J449 Chronic obstructive pulmonary disease, unspecified: Secondary | ICD-10-CM | POA: Diagnosis not present

## 2018-03-18 DIAGNOSIS — D649 Anemia, unspecified: Secondary | ICD-10-CM | POA: Diagnosis not present

## 2018-03-18 DIAGNOSIS — I10 Essential (primary) hypertension: Secondary | ICD-10-CM | POA: Diagnosis not present

## 2018-03-18 DIAGNOSIS — K5790 Diverticulosis of intestine, part unspecified, without perforation or abscess without bleeding: Secondary | ICD-10-CM | POA: Diagnosis not present

## 2018-03-18 DIAGNOSIS — Z89512 Acquired absence of left leg below knee: Secondary | ICD-10-CM | POA: Diagnosis not present

## 2018-03-18 DIAGNOSIS — I7300 Raynaud's syndrome without gangrene: Secondary | ICD-10-CM | POA: Diagnosis not present

## 2018-03-18 DIAGNOSIS — M069 Rheumatoid arthritis, unspecified: Secondary | ICD-10-CM | POA: Diagnosis not present

## 2018-03-18 DIAGNOSIS — I7090 Unspecified atherosclerosis: Secondary | ICD-10-CM | POA: Diagnosis not present

## 2018-03-18 DIAGNOSIS — T8789 Other complications of amputation stump: Secondary | ICD-10-CM | POA: Diagnosis not present

## 2018-03-19 ENCOUNTER — Telehealth (INDEPENDENT_AMBULATORY_CARE_PROVIDER_SITE_OTHER): Payer: Self-pay

## 2018-03-19 NOTE — Telephone Encounter (Signed)
Anderson Malta from [place] called to see if we had received wound VAC order renewal paperwork on this patient. I did not see this paperwork on Dr. Nino Parsley desk so I called Anderson Malta back to have her to resend the paperwork so that I could have him sign it.

## 2018-03-20 DIAGNOSIS — E7800 Pure hypercholesterolemia, unspecified: Secondary | ICD-10-CM | POA: Diagnosis not present

## 2018-03-20 DIAGNOSIS — Z79899 Other long term (current) drug therapy: Secondary | ICD-10-CM | POA: Diagnosis not present

## 2018-03-20 DIAGNOSIS — I10 Essential (primary) hypertension: Secondary | ICD-10-CM | POA: Diagnosis not present

## 2018-03-20 DIAGNOSIS — E119 Type 2 diabetes mellitus without complications: Secondary | ICD-10-CM | POA: Diagnosis not present

## 2018-03-20 DIAGNOSIS — J410 Simple chronic bronchitis: Secondary | ICD-10-CM | POA: Diagnosis not present

## 2018-03-20 DIAGNOSIS — D649 Anemia, unspecified: Secondary | ICD-10-CM | POA: Diagnosis not present

## 2018-03-20 NOTE — Telephone Encounter (Signed)
I agree with you I don't have it

## 2018-03-21 ENCOUNTER — Ambulatory Visit (INDEPENDENT_AMBULATORY_CARE_PROVIDER_SITE_OTHER): Payer: Medicare HMO | Admitting: Vascular Surgery

## 2018-03-21 ENCOUNTER — Telehealth (INDEPENDENT_AMBULATORY_CARE_PROVIDER_SITE_OTHER): Payer: Self-pay

## 2018-03-21 ENCOUNTER — Encounter (INDEPENDENT_AMBULATORY_CARE_PROVIDER_SITE_OTHER): Payer: Self-pay | Admitting: Vascular Surgery

## 2018-03-21 VITALS — BP 121/74 | HR 77 | Resp 16

## 2018-03-21 DIAGNOSIS — I7025 Atherosclerosis of native arteries of other extremities with ulceration: Secondary | ICD-10-CM

## 2018-03-21 DIAGNOSIS — T879 Unspecified complications of amputation stump: Secondary | ICD-10-CM

## 2018-03-21 DIAGNOSIS — M810 Age-related osteoporosis without current pathological fracture: Secondary | ICD-10-CM | POA: Diagnosis not present

## 2018-03-21 MED ORDER — HYDROCODONE-ACETAMINOPHEN 5-325 MG PO TABS: 1.0000 | ORAL_TABLET | Freq: Four times a day (QID) | ORAL | 0 refills | Status: DC | PRN

## 2018-03-21 NOTE — Telephone Encounter (Signed)
I contacted Ruthe Mannan at [facility] [phone] and left a message regarding the patient. Per Dr.Schnier the patient's wound vac was dismissed and she is to have a dressing change 3 times weekly of Aqua cell, gauze and a light ace wrap and the family can be taught to do this as well.

## 2018-03-22 ENCOUNTER — Encounter (INDEPENDENT_AMBULATORY_CARE_PROVIDER_SITE_OTHER): Payer: Self-pay | Admitting: Vascular Surgery

## 2018-03-22 ENCOUNTER — Telehealth (INDEPENDENT_AMBULATORY_CARE_PROVIDER_SITE_OTHER): Payer: Self-pay

## 2018-03-22 DIAGNOSIS — I7300 Raynaud's syndrome without gangrene: Secondary | ICD-10-CM | POA: Diagnosis not present

## 2018-03-22 DIAGNOSIS — K5790 Diverticulosis of intestine, part unspecified, without perforation or abscess without bleeding: Secondary | ICD-10-CM | POA: Diagnosis not present

## 2018-03-22 DIAGNOSIS — T8789 Other complications of amputation stump: Secondary | ICD-10-CM | POA: Diagnosis not present

## 2018-03-22 DIAGNOSIS — I10 Essential (primary) hypertension: Secondary | ICD-10-CM | POA: Diagnosis not present

## 2018-03-22 DIAGNOSIS — M069 Rheumatoid arthritis, unspecified: Secondary | ICD-10-CM | POA: Diagnosis not present

## 2018-03-22 DIAGNOSIS — I7090 Unspecified atherosclerosis: Secondary | ICD-10-CM | POA: Diagnosis not present

## 2018-03-22 DIAGNOSIS — Z89512 Acquired absence of left leg below knee: Secondary | ICD-10-CM | POA: Diagnosis not present

## 2018-03-22 DIAGNOSIS — D649 Anemia, unspecified: Secondary | ICD-10-CM | POA: Diagnosis not present

## 2018-03-22 DIAGNOSIS — J449 Chronic obstructive pulmonary disease, unspecified: Secondary | ICD-10-CM | POA: Diagnosis not present

## 2018-03-22 NOTE — Progress Notes (Signed)
Patient ID: Melissa [name], female   DOB: 02-11-1941, 76 y.o.   MRN: [id number]  Chief Complaint  Patient presents with  . Follow-up    2 week [facility] follow up    HPI Melissa [name] is a 77 y.o. female.    The amputation site has been hurting more and the patient is complaining of odor associated with the VAC.   Past Medical History:  Diagnosis Date  . Anemia 12/2017   blood transfusion after amputation  . Anxiety   . Arthritis    rheumatoid  . Ataxia   . Chronic ulcer of right great toe ([place]) 2019   slowly healing  . COPD (chronic obstructive pulmonary disease) ([place])   . Coronary artery disease 2018   complete blockage in one vessel, 40 % in another, 25% in 3rd vessell  . Depression   . Diverticulosis   . Dysrhythmia    atrial fib  . Excessive falling   . GERD (gastroesophageal reflux disease)    ulcerative colitis  . Hypercholesteremia   . Hyperlipidemia   . Hypertension   . IBS (irritable bowel syndrome)   . MRSA (methicillin resistant staph aureus) culture positive 11/2017   patient unaware of this diagnosis, was never informed or treated  . Myocardial infarction ([place]) 08/2017   had angio but no stents  . Osteoarthritis   . Psoriasis   . Raynaud's disease without gangrene   . Raynaud's disease without gangrene   . Shortness of breath dyspnea    with exertion  . 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 ABDOMINAL HYSTERECTOMY  1980  . AMPUTATION Left 12/12/2017   Procedure: AMPUTATION BELOW KNEE;  Surgeon: Katha Cabal, MD;  Location: [facility] ORS;  Service: Vascular;  Laterality: Left;  . AMPUTATION Left 02/08/2018   Procedure: AMPUTATION BELOW KNEE ( REVISION );  Surgeon: Katha Cabal, MD;  Location: [facility] ORS;  Service: Vascular;  Laterality: Left;  . APPENDECTOMY  1956  . APPLICATION OF WOUND VAC Left 01/25/2018   Procedure: APPLICATION OF WOUND VAC;  Surgeon: Katha Cabal, MD;  Location: [facility] ORS;  Service: Vascular;  Laterality: Left;  . APPLICATION OF WOUND VAC Left 03/08/2018   Procedure: APPLICATION OF WOUND VAC;  Surgeon: Katha Cabal, MD;  Location: [facility] ORS;  Service: Vascular;  Laterality: Left;  . BACK SURGERY  2013   Spinal fusion with rods and screws  . CHOLECYSTECTOMY    . COLONOSCOPY WITH PROPOFOL N/A 01/29/2017   Procedure: COLONOSCOPY WITH PROPOFOL;  Surgeon: Manya Silvas, MD;  Location[facility] END[facility]COPY;  Service: Endoscopy;  Laterality: N/A;  . ESOPHAGOGASTRODUODENOSCOPY (EGD) WITH PROPOFOL N/A 01/29/2017   Procedure: ESOPHAGOGASTRODUODENOSCOPY (EGD) WITH PROPOFOL;  Surgeon: Manya Silvas, MD;  Location: [facility] ENDOSCOPY;  Service: Endoscopy;  Laterality: N/A;  . EYE SURGERY Bilateral 2012   Cataract Extraction with IOL  . KNEE ARTHROSCOPY Left 2009   partial lateral meniscectomy, debridement, excision of Baker's cyst  . LEFT HEART CATH AND CORONARY ANGIOGRAPHY N/A 04/23/2017   Procedure: LEFT HEART CATH AND CORONARY ANGIOGRAPHY;  Surgeon: Teodoro Spray, MD;  Location: [place] CV LAB;  Service: Cardiovascular;  Laterality: N/A;  . LEFT HEART CATH AND CORONARY ANGIOGRAPHY Left 09/07/2017   Procedure: LEFT HEART CATH AND CORONARY ANGIOGRAPHY;  Surgeon: Teodoro Spray, MD;  Location: [place] CV LAB;  Service: Cardiovascular;  Laterality: Left;  .  LOWER EXTREMITY ANGIOGRAPHY Left 11/27/2017   Procedure: LOWER EXTREMITY ANGIOGRAPHY;  Surgeon: Katha Cabal, MD;  Location: [place] CV LAB;  Service: Cardiovascular;  Laterality: Left;  . LOWER EXTREMITY ANGIOGRAPHY Left 12/05/2017   Procedure: LOWER EXTREMITY ANGIOGRAPHY;  Surgeon: Katha Cabal, MD;  Location: [place] CV LAB;  Service: Cardiovascular;  Laterality: Left;  . ROTATOR CUFF REPAIR Right 1990  . TOTAL KNEE ARTHROPLASTY Left 04/04/2016   Procedure: TOTAL KNEE ARTHROPLASTY;  Surgeon: Hessie Knows, MD;  Location: [facility] ORS;  Service:  Orthopedics;  Laterality: Left;  . WOUND DEBRIDEMENT Left 01/25/2018   Procedure: DEBRIDEMENT WOUND;  Surgeon: Katha Cabal, MD;  Location: [facility] ORS;  Service: Vascular;  Laterality: Left;  . WOUND DEBRIDEMENT Left 03/08/2018   Procedure: DEBRIDEMENT WOUND;  Surgeon: Katha Cabal, MD;  Location: [facility] ORS;  Service: Vascular;  Laterality: Left;      Allergies  Allergen Reactions  . Lodine [Etodolac] Hives and Itching  . Methotrexate Derivatives Other (See Comments)    Flu like symptoms  . Nitrofuran Derivatives Nausea And Vomiting and Other (See Comments)    Made sicker   . Levaquin [Levofloxacin] Itching, Rash and Other (See Comments)    Muscle pain  . Norvasc [Amlodipine] Other (See Comments)    Unknown   . Pentasa [Mesalamine Er] Other (See Comments)    Unknown   . Sulfa Antibiotics Itching and Rash  . Vioxx [Rofecoxib] Other (See Comments)    Unknown     Current Outpatient Medications  Medication Sig Dispense Refill  . acetaminophen (TYLENOL) 500 MG tablet Take 1,000 mg by mouth every 8 (eight) hours as needed for mild pain or headache.    Marland Kitchen apixaban (ELIQUIS) 5 MG TABS tablet Take 1 tablet (5 mg total) by mouth 2 (two) times daily. 60 tablet   . aspirin EC 81 MG tablet Take 1 tablet (81 mg total) by mouth daily.    . budesonide (ENTOCORT EC) 3 MG 24 hr capsule Take 3 capsules (9 mg total) by mouth daily. Taper down to 3 mg 30 capsule 0  . Calcium Carbonate-Vitamin D (CALCIUM 600+D) 600-400 MG-UNIT tablet Take 1 tablet by mouth 2 (two) times daily.     . clopidogrel (PLAVIX) 75 MG tablet TAKE 1 TABLET BY MOUTH EVERY DAY 90 tablet 1  . Cranberry 500 MG CAPS Take 500 mg by mouth daily.    Marland Kitchen diltiazem (CARDIZEM CD) 240 MG 24 hr capsule Take 240 mg by mouth daily.    . feeding supplement, ENSURE ENLIVE, (ENSURE ENLIVE) LIQD Take 237 mLs by mouth 2 (two) times daily between meals. 237 mL 12  . ferrous sulfate 325 (65 FE) MG tablet Take 325 mg by mouth daily with  breakfast.    . gabapentin (NEURONTIN) 300 MG capsule TAKE ONE CAPSULE BY MOUTH EVERY DAY USE THIS MEDICATION FIRST 30 capsule 0  . gemfibrozil (LOPID) 600 MG tablet Take 600 mg by mouth 2 (two) times daily before a meal.    . HYDROcodone-acetaminophen (NORCO) 5-325 MG tablet Take 1-2 tablets by mouth every 6 (six) hours as needed for moderate pain or severe pain. 40 tablet 0  . hydroxychloroquine (PLAQUENIL) 200 MG tablet Take 200 mg by mouth 2 (two) times daily.     Marland Kitchen leflunomide (ARAVA) 20 MG tablet Take 20 mg by mouth daily.    Marland Kitchen lisinopril (PRINIVIL,ZESTRIL) 10 MG tablet Take 10 mg by mouth daily.    Marland Kitchen LORazepam (ATIVAN) 1 MG tablet Take 1  tablet (1 mg total) by mouth 2 (two) times daily. 20 tablet 0  . Melatonin 3 MG TABS Take by mouth at bedtime.    . Multiple Vitamin (MULTIVITAMIN) tablet Take 1 tablet by mouth daily.    . Multiple Vitamins-Minerals (PRESERVISION AREDS) CAPS Take 1 capsule by mouth 2 (two) times daily.    Marland Kitchen omeprazole (PRILOSEC) 20 MG capsule Take 20 mg by mouth 2 (two) times daily before a meal.     . oxyCODONE (OXY IR/ROXICODONE) 5 MG immediate release tablet Take by mouth.    . pravastatin (PRAVACHOL) 80 MG tablet Take 80 mg by mouth at bedtime.     . predniSONE (DELTASONE) 5 MG tablet Take 5 mg by mouth daily with breakfast.     . sertraline (ZOLOFT) 50 MG tablet Take 50 mg by mouth at bedtime.     Marland Kitchen tiZANidine (ZANAFLEX) 4 MG tablet Take 4 mg by mouth daily as needed for muscle spasms (neck pain).     . traMADol (ULTRAM) 50 MG tablet Take 1 tablet (50 mg total) by mouth every 6 (six) hours as needed for moderate pain. 28 tablet 2  . vancomycin (VANCOCIN) 250 MG capsule      No current facility-administered medications for this visit.         Physical Exam BP 121/74 (BP Location: Right Arm)   Pulse 77   Resp 16  Gen:  WD/WN, NAD Skin: incision is granulating well with just a few small areas of slough.     Assessment/Plan:  1. Complication of below  knee amputation stump ([place]) I we will discontinue the VAC and begin a daily Aquacel Ag.  Overall I think her wound appears to be granulating well and I am still hopeful that she will heal her below-knee amputation.  2. Atherosclerosis of native arteries of the extremities with ulceration [facility]) See #1      [name] 03/22/2018, 9:52 AM   This note was created with Dragon medical transcription system.  Any errors from dictation are unintentional.

## 2018-03-22 NOTE — Telephone Encounter (Signed)
I called [facility] today and spoke with Pankti regarding the patient. I gave her the recommendations from Dr. Delana Meyer, patient is to have Aquacell, gauze and a light ace wrap changed 3 times a week. Pankti was understanding of the recommendation. I then proceeded to call Lorne Skeens the patient's daughter and let her know about the recommendation and she was concerned that the [place] would not be left with them and she stated that Dr. Delana Meyer told her to change the dressing everyday. I advised that if she had other questions or concerns to call our office.

## 2018-03-22 NOTE — Telephone Encounter (Signed)
Patient got new orders during her visit to the office on 03/21/2018.

## 2018-03-24 NOTE — Telephone Encounter (Signed)
Spoke alginate is an acceptable substitute for Aquacel Ag until the agency can obtain some.  Will speak with Dr. Delana Meyer directly on Monday in the office.

## 2018-03-25 DIAGNOSIS — M069 Rheumatoid arthritis, unspecified: Secondary | ICD-10-CM | POA: Diagnosis not present

## 2018-03-25 DIAGNOSIS — D649 Anemia, unspecified: Secondary | ICD-10-CM | POA: Diagnosis not present

## 2018-03-25 DIAGNOSIS — T8789 Other complications of amputation stump: Secondary | ICD-10-CM | POA: Diagnosis not present

## 2018-03-25 DIAGNOSIS — I7090 Unspecified atherosclerosis: Secondary | ICD-10-CM | POA: Diagnosis not present

## 2018-03-25 DIAGNOSIS — I10 Essential (primary) hypertension: Secondary | ICD-10-CM | POA: Diagnosis not present

## 2018-03-25 DIAGNOSIS — I7300 Raynaud's syndrome without gangrene: Secondary | ICD-10-CM | POA: Diagnosis not present

## 2018-03-25 DIAGNOSIS — K5790 Diverticulosis of intestine, part unspecified, without perforation or abscess without bleeding: Secondary | ICD-10-CM | POA: Diagnosis not present

## 2018-03-25 DIAGNOSIS — J449 Chronic obstructive pulmonary disease, unspecified: Secondary | ICD-10-CM | POA: Diagnosis not present

## 2018-03-25 DIAGNOSIS — Z89512 Acquired absence of left leg below knee: Secondary | ICD-10-CM | POA: Diagnosis not present

## 2018-03-27 ENCOUNTER — Telehealth (INDEPENDENT_AMBULATORY_CARE_PROVIDER_SITE_OTHER): Payer: Self-pay

## 2018-03-27 DIAGNOSIS — I7090 Unspecified atherosclerosis: Secondary | ICD-10-CM | POA: Diagnosis not present

## 2018-03-27 DIAGNOSIS — I7300 Raynaud's syndrome without gangrene: Secondary | ICD-10-CM | POA: Diagnosis not present

## 2018-03-27 DIAGNOSIS — T8789 Other complications of amputation stump: Secondary | ICD-10-CM | POA: Diagnosis not present

## 2018-03-27 DIAGNOSIS — J449 Chronic obstructive pulmonary disease, unspecified: Secondary | ICD-10-CM | POA: Diagnosis not present

## 2018-03-27 DIAGNOSIS — M069 Rheumatoid arthritis, unspecified: Secondary | ICD-10-CM | POA: Diagnosis not present

## 2018-03-27 DIAGNOSIS — K5790 Diverticulosis of intestine, part unspecified, without perforation or abscess without bleeding: Secondary | ICD-10-CM | POA: Diagnosis not present

## 2018-03-27 DIAGNOSIS — D649 Anemia, unspecified: Secondary | ICD-10-CM | POA: Diagnosis not present

## 2018-03-27 DIAGNOSIS — Z89512 Acquired absence of left leg below knee: Secondary | ICD-10-CM | POA: Diagnosis not present

## 2018-03-27 DIAGNOSIS — I10 Essential (primary) hypertension: Secondary | ICD-10-CM | POA: Diagnosis not present

## 2018-03-27 NOTE — Telephone Encounter (Signed)
Lauren called from [facility] to get the instructions as to the frequency and what should be used on the patient each time that her dressing is to be changed.  I read to her the three different messages that has been clarified, that Dr. Delana Meyer would like for the dressing to be changed three time a week by [facility], and if the family wants to learn to change the bandages then they can be shown this and they can change the bandages on the off days. It is not required for them to change the bandages, only if they would like to change them as the family discussed with Dr. Delana Meyer at the last office visit.

## 2018-03-28 ENCOUNTER — Encounter (INDEPENDENT_AMBULATORY_CARE_PROVIDER_SITE_OTHER): Payer: Self-pay | Admitting: Vascular Surgery

## 2018-03-28 ENCOUNTER — Ambulatory Visit (INDEPENDENT_AMBULATORY_CARE_PROVIDER_SITE_OTHER): Payer: Medicare HMO | Admitting: Vascular Surgery

## 2018-03-28 VITALS — BP 111/59 | HR 77 | Resp 16

## 2018-03-28 DIAGNOSIS — I7025 Atherosclerosis of native arteries of other extremities with ulceration: Secondary | ICD-10-CM

## 2018-03-28 DIAGNOSIS — T879 Unspecified complications of amputation stump: Secondary | ICD-10-CM

## 2018-03-29 DIAGNOSIS — J449 Chronic obstructive pulmonary disease, unspecified: Secondary | ICD-10-CM | POA: Diagnosis not present

## 2018-03-29 DIAGNOSIS — Z89512 Acquired absence of left leg below knee: Secondary | ICD-10-CM | POA: Diagnosis not present

## 2018-03-29 DIAGNOSIS — T8789 Other complications of amputation stump: Secondary | ICD-10-CM | POA: Diagnosis not present

## 2018-03-29 DIAGNOSIS — K5790 Diverticulosis of intestine, part unspecified, without perforation or abscess without bleeding: Secondary | ICD-10-CM | POA: Diagnosis not present

## 2018-03-29 DIAGNOSIS — I7090 Unspecified atherosclerosis: Secondary | ICD-10-CM | POA: Diagnosis not present

## 2018-03-29 DIAGNOSIS — M069 Rheumatoid arthritis, unspecified: Secondary | ICD-10-CM | POA: Diagnosis not present

## 2018-03-29 DIAGNOSIS — D649 Anemia, unspecified: Secondary | ICD-10-CM | POA: Diagnosis not present

## 2018-03-29 DIAGNOSIS — I7300 Raynaud's syndrome without gangrene: Secondary | ICD-10-CM | POA: Diagnosis not present

## 2018-03-29 DIAGNOSIS — I10 Essential (primary) hypertension: Secondary | ICD-10-CM | POA: Diagnosis not present

## 2018-03-30 DIAGNOSIS — Z89512 Acquired absence of left leg below knee: Secondary | ICD-10-CM | POA: Diagnosis not present

## 2018-03-30 DIAGNOSIS — G8929 Other chronic pain: Secondary | ICD-10-CM | POA: Diagnosis not present

## 2018-03-30 DIAGNOSIS — M6281 Muscle weakness (generalized): Secondary | ICD-10-CM | POA: Diagnosis not present

## 2018-03-31 ENCOUNTER — Other Ambulatory Visit (INDEPENDENT_AMBULATORY_CARE_PROVIDER_SITE_OTHER): Payer: Self-pay | Admitting: Vascular Surgery

## 2018-04-01 DIAGNOSIS — T8789 Other complications of amputation stump: Secondary | ICD-10-CM | POA: Diagnosis not present

## 2018-04-01 DIAGNOSIS — D649 Anemia, unspecified: Secondary | ICD-10-CM | POA: Diagnosis not present

## 2018-04-01 DIAGNOSIS — I7300 Raynaud's syndrome without gangrene: Secondary | ICD-10-CM | POA: Diagnosis not present

## 2018-04-01 DIAGNOSIS — I10 Essential (primary) hypertension: Secondary | ICD-10-CM | POA: Diagnosis not present

## 2018-04-01 DIAGNOSIS — Z89512 Acquired absence of left leg below knee: Secondary | ICD-10-CM | POA: Diagnosis not present

## 2018-04-01 DIAGNOSIS — K5790 Diverticulosis of intestine, part unspecified, without perforation or abscess without bleeding: Secondary | ICD-10-CM | POA: Diagnosis not present

## 2018-04-01 DIAGNOSIS — J449 Chronic obstructive pulmonary disease, unspecified: Secondary | ICD-10-CM | POA: Diagnosis not present

## 2018-04-01 DIAGNOSIS — I7090 Unspecified atherosclerosis: Secondary | ICD-10-CM | POA: Diagnosis not present

## 2018-04-01 DIAGNOSIS — M069 Rheumatoid arthritis, unspecified: Secondary | ICD-10-CM | POA: Diagnosis not present

## 2018-04-02 ENCOUNTER — Other Ambulatory Visit (INDEPENDENT_AMBULATORY_CARE_PROVIDER_SITE_OTHER): Payer: Self-pay | Admitting: Vascular Surgery

## 2018-04-03 DIAGNOSIS — I7090 Unspecified atherosclerosis: Secondary | ICD-10-CM | POA: Diagnosis not present

## 2018-04-03 DIAGNOSIS — I10 Essential (primary) hypertension: Secondary | ICD-10-CM | POA: Diagnosis not present

## 2018-04-03 DIAGNOSIS — K5790 Diverticulosis of intestine, part unspecified, without perforation or abscess without bleeding: Secondary | ICD-10-CM | POA: Diagnosis not present

## 2018-04-03 DIAGNOSIS — T8789 Other complications of amputation stump: Secondary | ICD-10-CM | POA: Diagnosis not present

## 2018-04-03 DIAGNOSIS — Z89512 Acquired absence of left leg below knee: Secondary | ICD-10-CM | POA: Diagnosis not present

## 2018-04-03 DIAGNOSIS — M069 Rheumatoid arthritis, unspecified: Secondary | ICD-10-CM | POA: Diagnosis not present

## 2018-04-03 DIAGNOSIS — D649 Anemia, unspecified: Secondary | ICD-10-CM | POA: Diagnosis not present

## 2018-04-03 DIAGNOSIS — J449 Chronic obstructive pulmonary disease, unspecified: Secondary | ICD-10-CM | POA: Diagnosis not present

## 2018-04-03 DIAGNOSIS — I7300 Raynaud's syndrome without gangrene: Secondary | ICD-10-CM | POA: Diagnosis not present

## 2018-04-05 DIAGNOSIS — I10 Essential (primary) hypertension: Secondary | ICD-10-CM | POA: Diagnosis not present

## 2018-04-05 DIAGNOSIS — I7090 Unspecified atherosclerosis: Secondary | ICD-10-CM | POA: Diagnosis not present

## 2018-04-05 DIAGNOSIS — T8789 Other complications of amputation stump: Secondary | ICD-10-CM | POA: Diagnosis not present

## 2018-04-05 DIAGNOSIS — K5790 Diverticulosis of intestine, part unspecified, without perforation or abscess without bleeding: Secondary | ICD-10-CM | POA: Diagnosis not present

## 2018-04-05 DIAGNOSIS — Z89512 Acquired absence of left leg below knee: Secondary | ICD-10-CM | POA: Diagnosis not present

## 2018-04-05 DIAGNOSIS — I7300 Raynaud's syndrome without gangrene: Secondary | ICD-10-CM | POA: Diagnosis not present

## 2018-04-05 DIAGNOSIS — M069 Rheumatoid arthritis, unspecified: Secondary | ICD-10-CM | POA: Diagnosis not present

## 2018-04-05 DIAGNOSIS — D649 Anemia, unspecified: Secondary | ICD-10-CM | POA: Diagnosis not present

## 2018-04-05 DIAGNOSIS — J449 Chronic obstructive pulmonary disease, unspecified: Secondary | ICD-10-CM | POA: Diagnosis not present

## 2018-04-07 ENCOUNTER — Encounter (INDEPENDENT_AMBULATORY_CARE_PROVIDER_SITE_OTHER): Payer: Self-pay | Admitting: Vascular Surgery

## 2018-04-07 NOTE — Progress Notes (Signed)
Patient ID: Melissa [name], female   DOB: 1940/12/14, 77 y.o.   MRN: [id number]  Chief Complaint  Patient presents with  . Follow-up    1week follow up    HPI Melissa [name] is a 77 y.o. female.    Less pain  No odor no fever chills  Past Medical History:  Diagnosis Date  . Anemia 12/2017   blood transfusion after amputation  . Anxiety   . Arthritis    rheumatoid  . Ataxia   . Chronic ulcer of right great toe ([place]) 2019   slowly healing  . COPD (chronic obstructive pulmonary disease) ([place])   . Coronary artery disease 2018   complete blockage in one vessel, 40 % in another, 25% in 3rd vessell  . Depression   . Diverticulosis   . Dysrhythmia    atrial fib  . Excessive falling   . GERD (gastroesophageal reflux disease)    ulcerative colitis  . Hypercholesteremia   . Hyperlipidemia   . Hypertension   . IBS (irritable bowel syndrome)   . MRSA (methicillin resistant staph aureus) culture positive 11/2017   patient unaware of this diagnosis, was never informed or treated  . Myocardial infarction ([place]) 08/2017   had angio but no stents  . Osteoarthritis   . Psoriasis   . Raynaud's disease without gangrene   . Raynaud's disease without gangrene   . Shortness of breath dyspnea    with exertion  . 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 ABDOMINAL HYSTERECTOMY  1980  . AMPUTATION Left 12/12/2017   Procedure: AMPUTATION BELOW KNEE;  Surgeon: Katha Cabal, MD;  Location: [facility] ORS;  Service: Vascular;  Laterality: Left;  . AMPUTATION Left 02/08/2018   Procedure: AMPUTATION BELOW KNEE ( REVISION );  Surgeon: Katha Cabal, MD;  Location: [facility];  Service: Vascular;  Laterality: Left;  . APPENDECTOMY  1956  . APPLICATION OF WOUND VAC Left 01/25/2018   Procedure: APPLICATION OF WOUND VAC;  Surgeon: Katha Cabal, MD;  Location: [facility];  Service: Vascular;  Laterality: Left;  .  APPLICATION OF WOUND VAC Left 03/08/2018   Procedure: APPLICATION OF WOUND VAC;  Surgeon: Katha Cabal, MD;  Location: [facility];  Service: Vascular;  Laterality: Left;  . BACK SURGERY  2013   Spinal fusion with rods and screws  . CHOLECYSTECTOMY    . COLONOSCOPY WITH PROPOFOL N/A 01/29/2017   Procedure: COLONOSCOPY WITH PROPOFOL;  Surgeon: Manya Silvas, MD;  Location[facility] END[facility]COPY;  Service: Endoscopy;  Laterality: N/A;  . ESOPHAGOGASTRODUODENOSCOPY (EGD) WITH PROPOFOL N/A 01/29/2017   Procedure: ESOPHAGOGASTRODUODENOSCOPY (EGD) WITH PROPOFOL;  Surgeon: Manya Silvas, MD;  Location: [facility] ENDOSCOPY;  Service: Endoscopy;  Laterality: N/A;  . EYE SURGERY Bilateral 2012   Cataract Extraction with IOL  . KNEE ARTHROSCOPY Left 2009   partial lateral meniscectomy, debridement, excision of Baker's cyst  . LEFT HEART CATH AND CORONARY ANGIOGRAPHY N/A 04/23/2017   Procedure: LEFT HEART CATH AND CORONARY ANGIOGRAPHY;  Surgeon: Teodoro Spray, MD;  Location: [place] CV LAB;  Service: Cardiovascular;  Laterality: N/A;  . LEFT HEART CATH AND CORONARY ANGIOGRAPHY Left 09/07/2017   Procedure: LEFT HEART CATH AND CORONARY ANGIOGRAPHY;  Surgeon: Teodoro Spray, MD;  Location: [place] CV LAB;  Service: Cardiovascular;  Laterality: Left;  . LOWER EXTREMITY ANGIOGRAPHY Left 11/27/2017   Procedure: LOWER EXTREMITY ANGIOGRAPHY;  Surgeon: Katha Cabal, MD;  Location: [place] CV LAB;  Service: Cardiovascular;  Laterality: Left;  . LOWER EXTREMITY ANGIOGRAPHY Left 12/05/2017   Procedure: LOWER EXTREMITY ANGIOGRAPHY;  Surgeon: Katha Cabal, MD;  Location: [place] CV LAB;  Service: Cardiovascular;  Laterality: Left;  . ROTATOR CUFF REPAIR Right 1990  . TOTAL KNEE ARTHROPLASTY Left 04/04/2016   Procedure: TOTAL KNEE ARTHROPLASTY;  Surgeon: Hessie Knows, MD;  Location: [facility] ORS;  Service: Orthopedics;  Laterality: Left;  . WOUND DEBRIDEMENT Left 01/25/2018   Procedure:  DEBRIDEMENT WOUND;  Surgeon: Katha Cabal, MD;  Location: [facility];  Service: Vascular;  Laterality: Left;  . WOUND DEBRIDEMENT Left 03/08/2018   Procedure: DEBRIDEMENT WOUND;  Surgeon: Katha Cabal, MD;  Location: [facility];  Service: Vascular;  Laterality: Left;      Allergies  Allergen Reactions  . Lodine [Etodolac] Hives and Itching  . Methotrexate Derivatives Other (See Comments)    Flu like symptoms  . Nitrofuran Derivatives Nausea And Vomiting and Other (See Comments)    Made sicker   . Levaquin [Levofloxacin] Itching, Rash and Other (See Comments)    Muscle pain  . Norvasc [Amlodipine] Other (See Comments)    Unknown   . Pentasa [Mesalamine Er] Other (See Comments)    Unknown   . Sulfa Antibiotics Itching and Rash  . Vioxx [Rofecoxib] Other (See Comments)    Unknown     Current Outpatient Medications  Medication Sig Dispense Refill  . acetaminophen (TYLENOL) 500 MG tablet Take 1,000 mg by mouth every 8 (eight) hours as needed for mild pain or headache.    Marland Kitchen apixaban (ELIQUIS) 5 MG TABS tablet Take 1 tablet (5 mg total) by mouth 2 (two) times daily. 60 tablet   . aspirin EC 81 MG tablet Take 1 tablet (81 mg total) by mouth daily.    . budesonide (ENTOCORT EC) 3 MG 24 hr capsule Take 3 capsules (9 mg total) by mouth daily. Taper down to 3 mg 30 capsule 0  . Calcium Carbonate-Vitamin D (CALCIUM 600+D) 600-400 MG-UNIT tablet Take 1 tablet by mouth 2 (two) times daily.     . clopidogrel (PLAVIX) 75 MG tablet TAKE 1 TABLET BY MOUTH EVERY DAY 90 tablet 1  . Cranberry 500 MG CAPS Take 500 mg by mouth daily.    Marland Kitchen diltiazem (CARDIZEM CD) 240 MG 24 hr capsule Take 240 mg by mouth daily.    . feeding supplement, ENSURE ENLIVE, (ENSURE ENLIVE) LIQD Take 237 mLs by mouth 2 (two) times daily between meals. 237 mL 12  . ferrous sulfate 325 (65 FE) MG tablet Take 325 mg by mouth daily with breakfast.    . gemfibrozil (LOPID) 600 MG tablet Take 600 mg by mouth 2 (two) times daily  before a meal.    . HYDROcodone-acetaminophen (NORCO) 5-325 MG tablet Take 1-2 tablets by mouth every 6 (six) hours as needed for moderate pain or severe pain. 40 tablet 0  . hydroxychloroquine (PLAQUENIL) 200 MG tablet Take 200 mg by mouth 2 (two) times daily.     Marland Kitchen leflunomide (ARAVA) 20 MG tablet Take 20 mg by mouth daily.    Marland Kitchen lisinopril (PRINIVIL,ZESTRIL) 10 MG tablet Take 10 mg by mouth daily.    Marland Kitchen LORazepam (ATIVAN) 1 MG tablet Take 1 tablet (1 mg total) by mouth 2 (two) times daily. 20 tablet 0  . Melatonin 3 MG TABS Take by mouth at bedtime.    . Multiple Vitamin (MULTIVITAMIN) tablet Take  1 tablet by mouth daily.    . Multiple Vitamins-Minerals (PRESERVISION AREDS) CAPS Take 1 capsule by mouth 2 (two) times daily.    Marland Kitchen omeprazole (PRILOSEC) 20 MG capsule Take 20 mg by mouth 2 (two) times daily before a meal.     . pravastatin (PRAVACHOL) 80 MG tablet Take 80 mg by mouth at bedtime.     . predniSONE (DELTASONE) 5 MG tablet Take 5 mg by mouth daily with breakfast.     . sertraline (ZOLOFT) 50 MG tablet Take 50 mg by mouth at bedtime.     Marland Kitchen tiZANidine (ZANAFLEX) 4 MG tablet Take 4 mg by mouth daily as needed for muscle spasms (neck pain).     . traMADol (ULTRAM) 50 MG tablet Take 1 tablet (50 mg total) by mouth every 6 (six) hours as needed for moderate pain. 28 tablet 2  . vancomycin (VANCOCIN) 250 MG capsule     . gabapentin (NEURONTIN) 300 MG capsule TAKE ONE CAPSULE BY MOUTH EVERY DAY. USE THIS MEDICATION FIRST 30 capsule 5   No current facility-administered medications for this visit.         Physical Exam BP (!) 111/59 (BP Location: Right Arm)   Pulse 77   Resp 16  Gen:  WD/WN, NAD Skin: incision no well granulated no bone exposed     Assessment/Plan:  1. Complication of below knee amputation stump ([place]) Wound much better continue dressing changes as ordered  2. Atherosclerosis of native arteries of the extremities with ulceration ([place]) Continue to  monitor      [name] 04/07/2018, 4:55 PM   This note was created with Dragon medical transcription system.  Any errors from dictation are unintentional.

## 2018-04-08 DIAGNOSIS — K5790 Diverticulosis of intestine, part unspecified, without perforation or abscess without bleeding: Secondary | ICD-10-CM | POA: Diagnosis not present

## 2018-04-08 DIAGNOSIS — M069 Rheumatoid arthritis, unspecified: Secondary | ICD-10-CM | POA: Diagnosis not present

## 2018-04-08 DIAGNOSIS — I7300 Raynaud's syndrome without gangrene: Secondary | ICD-10-CM | POA: Diagnosis not present

## 2018-04-08 DIAGNOSIS — I10 Essential (primary) hypertension: Secondary | ICD-10-CM | POA: Diagnosis not present

## 2018-04-08 DIAGNOSIS — T8789 Other complications of amputation stump: Secondary | ICD-10-CM | POA: Diagnosis not present

## 2018-04-08 DIAGNOSIS — I7090 Unspecified atherosclerosis: Secondary | ICD-10-CM | POA: Diagnosis not present

## 2018-04-08 DIAGNOSIS — D649 Anemia, unspecified: Secondary | ICD-10-CM | POA: Diagnosis not present

## 2018-04-08 DIAGNOSIS — J449 Chronic obstructive pulmonary disease, unspecified: Secondary | ICD-10-CM | POA: Diagnosis not present

## 2018-04-08 DIAGNOSIS — Z89512 Acquired absence of left leg below knee: Secondary | ICD-10-CM | POA: Diagnosis not present

## 2018-04-09 DIAGNOSIS — H40013 Open angle with borderline findings, low risk, bilateral: Secondary | ICD-10-CM | POA: Diagnosis not present

## 2018-04-09 DIAGNOSIS — H52223 Regular astigmatism, bilateral: Secondary | ICD-10-CM | POA: Diagnosis not present

## 2018-04-10 DIAGNOSIS — Z89512 Acquired absence of left leg below knee: Secondary | ICD-10-CM | POA: Diagnosis not present

## 2018-04-10 DIAGNOSIS — I7090 Unspecified atherosclerosis: Secondary | ICD-10-CM | POA: Diagnosis not present

## 2018-04-10 DIAGNOSIS — J449 Chronic obstructive pulmonary disease, unspecified: Secondary | ICD-10-CM | POA: Diagnosis not present

## 2018-04-10 DIAGNOSIS — I10 Essential (primary) hypertension: Secondary | ICD-10-CM | POA: Diagnosis not present

## 2018-04-10 DIAGNOSIS — K5790 Diverticulosis of intestine, part unspecified, without perforation or abscess without bleeding: Secondary | ICD-10-CM | POA: Diagnosis not present

## 2018-04-10 DIAGNOSIS — M069 Rheumatoid arthritis, unspecified: Secondary | ICD-10-CM | POA: Diagnosis not present

## 2018-04-10 DIAGNOSIS — T8789 Other complications of amputation stump: Secondary | ICD-10-CM | POA: Diagnosis not present

## 2018-04-10 DIAGNOSIS — D649 Anemia, unspecified: Secondary | ICD-10-CM | POA: Diagnosis not present

## 2018-04-10 DIAGNOSIS — I7300 Raynaud's syndrome without gangrene: Secondary | ICD-10-CM | POA: Diagnosis not present

## 2018-04-12 DIAGNOSIS — K5790 Diverticulosis of intestine, part unspecified, without perforation or abscess without bleeding: Secondary | ICD-10-CM | POA: Diagnosis not present

## 2018-04-12 DIAGNOSIS — Z89512 Acquired absence of left leg below knee: Secondary | ICD-10-CM | POA: Diagnosis not present

## 2018-04-12 DIAGNOSIS — J449 Chronic obstructive pulmonary disease, unspecified: Secondary | ICD-10-CM | POA: Diagnosis not present

## 2018-04-12 DIAGNOSIS — I7300 Raynaud's syndrome without gangrene: Secondary | ICD-10-CM | POA: Diagnosis not present

## 2018-04-12 DIAGNOSIS — D649 Anemia, unspecified: Secondary | ICD-10-CM | POA: Diagnosis not present

## 2018-04-12 DIAGNOSIS — I7090 Unspecified atherosclerosis: Secondary | ICD-10-CM | POA: Diagnosis not present

## 2018-04-12 DIAGNOSIS — M069 Rheumatoid arthritis, unspecified: Secondary | ICD-10-CM | POA: Diagnosis not present

## 2018-04-12 DIAGNOSIS — I10 Essential (primary) hypertension: Secondary | ICD-10-CM | POA: Diagnosis not present

## 2018-04-12 DIAGNOSIS — T8789 Other complications of amputation stump: Secondary | ICD-10-CM | POA: Diagnosis not present

## 2018-04-15 DIAGNOSIS — I7090 Unspecified atherosclerosis: Secondary | ICD-10-CM | POA: Diagnosis not present

## 2018-04-15 DIAGNOSIS — I10 Essential (primary) hypertension: Secondary | ICD-10-CM | POA: Diagnosis not present

## 2018-04-15 DIAGNOSIS — J449 Chronic obstructive pulmonary disease, unspecified: Secondary | ICD-10-CM | POA: Diagnosis not present

## 2018-04-15 DIAGNOSIS — M069 Rheumatoid arthritis, unspecified: Secondary | ICD-10-CM | POA: Diagnosis not present

## 2018-04-15 DIAGNOSIS — D649 Anemia, unspecified: Secondary | ICD-10-CM | POA: Diagnosis not present

## 2018-04-15 DIAGNOSIS — K5790 Diverticulosis of intestine, part unspecified, without perforation or abscess without bleeding: Secondary | ICD-10-CM | POA: Diagnosis not present

## 2018-04-15 DIAGNOSIS — T8789 Other complications of amputation stump: Secondary | ICD-10-CM | POA: Diagnosis not present

## 2018-04-15 DIAGNOSIS — I7300 Raynaud's syndrome without gangrene: Secondary | ICD-10-CM | POA: Diagnosis not present

## 2018-04-15 DIAGNOSIS — Z89512 Acquired absence of left leg below knee: Secondary | ICD-10-CM | POA: Diagnosis not present

## 2018-04-17 DIAGNOSIS — J449 Chronic obstructive pulmonary disease, unspecified: Secondary | ICD-10-CM | POA: Diagnosis not present

## 2018-04-17 DIAGNOSIS — T8789 Other complications of amputation stump: Secondary | ICD-10-CM | POA: Diagnosis not present

## 2018-04-17 DIAGNOSIS — D649 Anemia, unspecified: Secondary | ICD-10-CM | POA: Diagnosis not present

## 2018-04-17 DIAGNOSIS — Z89512 Acquired absence of left leg below knee: Secondary | ICD-10-CM | POA: Diagnosis not present

## 2018-04-17 DIAGNOSIS — K5790 Diverticulosis of intestine, part unspecified, without perforation or abscess without bleeding: Secondary | ICD-10-CM | POA: Diagnosis not present

## 2018-04-17 DIAGNOSIS — M069 Rheumatoid arthritis, unspecified: Secondary | ICD-10-CM | POA: Diagnosis not present

## 2018-04-17 DIAGNOSIS — I7090 Unspecified atherosclerosis: Secondary | ICD-10-CM | POA: Diagnosis not present

## 2018-04-17 DIAGNOSIS — I10 Essential (primary) hypertension: Secondary | ICD-10-CM | POA: Diagnosis not present

## 2018-04-17 DIAGNOSIS — I7300 Raynaud's syndrome without gangrene: Secondary | ICD-10-CM | POA: Diagnosis not present

## 2018-04-19 DIAGNOSIS — I7090 Unspecified atherosclerosis: Secondary | ICD-10-CM | POA: Diagnosis not present

## 2018-04-19 DIAGNOSIS — Z89512 Acquired absence of left leg below knee: Secondary | ICD-10-CM | POA: Diagnosis not present

## 2018-04-19 DIAGNOSIS — K5790 Diverticulosis of intestine, part unspecified, without perforation or abscess without bleeding: Secondary | ICD-10-CM | POA: Diagnosis not present

## 2018-04-19 DIAGNOSIS — J449 Chronic obstructive pulmonary disease, unspecified: Secondary | ICD-10-CM | POA: Diagnosis not present

## 2018-04-19 DIAGNOSIS — I7300 Raynaud's syndrome without gangrene: Secondary | ICD-10-CM | POA: Diagnosis not present

## 2018-04-19 DIAGNOSIS — D649 Anemia, unspecified: Secondary | ICD-10-CM | POA: Diagnosis not present

## 2018-04-19 DIAGNOSIS — M069 Rheumatoid arthritis, unspecified: Secondary | ICD-10-CM | POA: Diagnosis not present

## 2018-04-19 DIAGNOSIS — T8789 Other complications of amputation stump: Secondary | ICD-10-CM | POA: Diagnosis not present

## 2018-04-19 DIAGNOSIS — I10 Essential (primary) hypertension: Secondary | ICD-10-CM | POA: Diagnosis not present

## 2018-04-22 DIAGNOSIS — D649 Anemia, unspecified: Secondary | ICD-10-CM | POA: Diagnosis not present

## 2018-04-22 DIAGNOSIS — K5790 Diverticulosis of intestine, part unspecified, without perforation or abscess without bleeding: Secondary | ICD-10-CM | POA: Diagnosis not present

## 2018-04-22 DIAGNOSIS — T8789 Other complications of amputation stump: Secondary | ICD-10-CM | POA: Diagnosis not present

## 2018-04-22 DIAGNOSIS — I7300 Raynaud's syndrome without gangrene: Secondary | ICD-10-CM | POA: Diagnosis not present

## 2018-04-22 DIAGNOSIS — I10 Essential (primary) hypertension: Secondary | ICD-10-CM | POA: Diagnosis not present

## 2018-04-22 DIAGNOSIS — Z89512 Acquired absence of left leg below knee: Secondary | ICD-10-CM | POA: Diagnosis not present

## 2018-04-22 DIAGNOSIS — J449 Chronic obstructive pulmonary disease, unspecified: Secondary | ICD-10-CM | POA: Diagnosis not present

## 2018-04-22 DIAGNOSIS — M069 Rheumatoid arthritis, unspecified: Secondary | ICD-10-CM | POA: Diagnosis not present

## 2018-04-22 DIAGNOSIS — I7090 Unspecified atherosclerosis: Secondary | ICD-10-CM | POA: Diagnosis not present

## 2018-04-24 DIAGNOSIS — K5790 Diverticulosis of intestine, part unspecified, without perforation or abscess without bleeding: Secondary | ICD-10-CM | POA: Diagnosis not present

## 2018-04-24 DIAGNOSIS — J449 Chronic obstructive pulmonary disease, unspecified: Secondary | ICD-10-CM | POA: Diagnosis not present

## 2018-04-24 DIAGNOSIS — I10 Essential (primary) hypertension: Secondary | ICD-10-CM | POA: Diagnosis not present

## 2018-04-24 DIAGNOSIS — Z89512 Acquired absence of left leg below knee: Secondary | ICD-10-CM | POA: Diagnosis not present

## 2018-04-24 DIAGNOSIS — T8789 Other complications of amputation stump: Secondary | ICD-10-CM | POA: Diagnosis not present

## 2018-04-24 DIAGNOSIS — D649 Anemia, unspecified: Secondary | ICD-10-CM | POA: Diagnosis not present

## 2018-04-24 DIAGNOSIS — I7090 Unspecified atherosclerosis: Secondary | ICD-10-CM | POA: Diagnosis not present

## 2018-04-24 DIAGNOSIS — I7300 Raynaud's syndrome without gangrene: Secondary | ICD-10-CM | POA: Diagnosis not present

## 2018-04-24 DIAGNOSIS — M069 Rheumatoid arthritis, unspecified: Secondary | ICD-10-CM | POA: Diagnosis not present

## 2018-04-26 DIAGNOSIS — T8789 Other complications of amputation stump: Secondary | ICD-10-CM | POA: Diagnosis not present

## 2018-04-26 DIAGNOSIS — J449 Chronic obstructive pulmonary disease, unspecified: Secondary | ICD-10-CM | POA: Diagnosis not present

## 2018-04-26 DIAGNOSIS — I10 Essential (primary) hypertension: Secondary | ICD-10-CM | POA: Diagnosis not present

## 2018-04-26 DIAGNOSIS — M069 Rheumatoid arthritis, unspecified: Secondary | ICD-10-CM | POA: Diagnosis not present

## 2018-04-26 DIAGNOSIS — D649 Anemia, unspecified: Secondary | ICD-10-CM | POA: Diagnosis not present

## 2018-04-26 DIAGNOSIS — I7090 Unspecified atherosclerosis: Secondary | ICD-10-CM | POA: Diagnosis not present

## 2018-04-26 DIAGNOSIS — Z89512 Acquired absence of left leg below knee: Secondary | ICD-10-CM | POA: Diagnosis not present

## 2018-04-26 DIAGNOSIS — I7300 Raynaud's syndrome without gangrene: Secondary | ICD-10-CM | POA: Diagnosis not present

## 2018-04-26 DIAGNOSIS — K5790 Diverticulosis of intestine, part unspecified, without perforation or abscess without bleeding: Secondary | ICD-10-CM | POA: Diagnosis not present

## 2018-04-29 DIAGNOSIS — I7090 Unspecified atherosclerosis: Secondary | ICD-10-CM | POA: Diagnosis not present

## 2018-04-29 DIAGNOSIS — D649 Anemia, unspecified: Secondary | ICD-10-CM | POA: Diagnosis not present

## 2018-04-29 DIAGNOSIS — Z89512 Acquired absence of left leg below knee: Secondary | ICD-10-CM | POA: Diagnosis not present

## 2018-04-29 DIAGNOSIS — G8929 Other chronic pain: Secondary | ICD-10-CM | POA: Diagnosis not present

## 2018-04-29 DIAGNOSIS — I10 Essential (primary) hypertension: Secondary | ICD-10-CM | POA: Diagnosis not present

## 2018-04-29 DIAGNOSIS — M6281 Muscle weakness (generalized): Secondary | ICD-10-CM | POA: Diagnosis not present

## 2018-04-29 DIAGNOSIS — J449 Chronic obstructive pulmonary disease, unspecified: Secondary | ICD-10-CM | POA: Diagnosis not present

## 2018-04-29 DIAGNOSIS — M069 Rheumatoid arthritis, unspecified: Secondary | ICD-10-CM | POA: Diagnosis not present

## 2018-04-29 DIAGNOSIS — T8789 Other complications of amputation stump: Secondary | ICD-10-CM | POA: Diagnosis not present

## 2018-04-29 DIAGNOSIS — K5790 Diverticulosis of intestine, part unspecified, without perforation or abscess without bleeding: Secondary | ICD-10-CM | POA: Diagnosis not present

## 2018-04-29 DIAGNOSIS — I7300 Raynaud's syndrome without gangrene: Secondary | ICD-10-CM | POA: Diagnosis not present

## 2018-05-01 DIAGNOSIS — K5790 Diverticulosis of intestine, part unspecified, without perforation or abscess without bleeding: Secondary | ICD-10-CM | POA: Diagnosis not present

## 2018-05-01 DIAGNOSIS — I10 Essential (primary) hypertension: Secondary | ICD-10-CM | POA: Diagnosis not present

## 2018-05-01 DIAGNOSIS — I7300 Raynaud's syndrome without gangrene: Secondary | ICD-10-CM | POA: Diagnosis not present

## 2018-05-01 DIAGNOSIS — D649 Anemia, unspecified: Secondary | ICD-10-CM | POA: Diagnosis not present

## 2018-05-01 DIAGNOSIS — I7090 Unspecified atherosclerosis: Secondary | ICD-10-CM | POA: Diagnosis not present

## 2018-05-01 DIAGNOSIS — Z89512 Acquired absence of left leg below knee: Secondary | ICD-10-CM | POA: Diagnosis not present

## 2018-05-01 DIAGNOSIS — T8789 Other complications of amputation stump: Secondary | ICD-10-CM | POA: Diagnosis not present

## 2018-05-01 DIAGNOSIS — J449 Chronic obstructive pulmonary disease, unspecified: Secondary | ICD-10-CM | POA: Diagnosis not present

## 2018-05-01 DIAGNOSIS — M069 Rheumatoid arthritis, unspecified: Secondary | ICD-10-CM | POA: Diagnosis not present

## 2018-05-03 DIAGNOSIS — T8789 Other complications of amputation stump: Secondary | ICD-10-CM | POA: Diagnosis not present

## 2018-05-03 DIAGNOSIS — I7300 Raynaud's syndrome without gangrene: Secondary | ICD-10-CM | POA: Diagnosis not present

## 2018-05-03 DIAGNOSIS — J449 Chronic obstructive pulmonary disease, unspecified: Secondary | ICD-10-CM | POA: Diagnosis not present

## 2018-05-03 DIAGNOSIS — M069 Rheumatoid arthritis, unspecified: Secondary | ICD-10-CM | POA: Diagnosis not present

## 2018-05-03 DIAGNOSIS — Z89512 Acquired absence of left leg below knee: Secondary | ICD-10-CM | POA: Diagnosis not present

## 2018-05-03 DIAGNOSIS — K5790 Diverticulosis of intestine, part unspecified, without perforation or abscess without bleeding: Secondary | ICD-10-CM | POA: Diagnosis not present

## 2018-05-03 DIAGNOSIS — I10 Essential (primary) hypertension: Secondary | ICD-10-CM | POA: Diagnosis not present

## 2018-05-03 DIAGNOSIS — D649 Anemia, unspecified: Secondary | ICD-10-CM | POA: Diagnosis not present

## 2018-05-03 DIAGNOSIS — I7090 Unspecified atherosclerosis: Secondary | ICD-10-CM | POA: Diagnosis not present

## 2018-05-06 DIAGNOSIS — J449 Chronic obstructive pulmonary disease, unspecified: Secondary | ICD-10-CM | POA: Diagnosis not present

## 2018-05-06 DIAGNOSIS — M069 Rheumatoid arthritis, unspecified: Secondary | ICD-10-CM | POA: Diagnosis not present

## 2018-05-06 DIAGNOSIS — I7300 Raynaud's syndrome without gangrene: Secondary | ICD-10-CM | POA: Diagnosis not present

## 2018-05-06 DIAGNOSIS — Z89512 Acquired absence of left leg below knee: Secondary | ICD-10-CM | POA: Diagnosis not present

## 2018-05-06 DIAGNOSIS — D649 Anemia, unspecified: Secondary | ICD-10-CM | POA: Diagnosis not present

## 2018-05-06 DIAGNOSIS — I10 Essential (primary) hypertension: Secondary | ICD-10-CM | POA: Diagnosis not present

## 2018-05-06 DIAGNOSIS — K5790 Diverticulosis of intestine, part unspecified, without perforation or abscess without bleeding: Secondary | ICD-10-CM | POA: Diagnosis not present

## 2018-05-06 DIAGNOSIS — T8789 Other complications of amputation stump: Secondary | ICD-10-CM | POA: Diagnosis not present

## 2018-05-06 DIAGNOSIS — I7090 Unspecified atherosclerosis: Secondary | ICD-10-CM | POA: Diagnosis not present

## 2018-05-08 DIAGNOSIS — I10 Essential (primary) hypertension: Secondary | ICD-10-CM | POA: Diagnosis not present

## 2018-05-08 DIAGNOSIS — M069 Rheumatoid arthritis, unspecified: Secondary | ICD-10-CM | POA: Diagnosis not present

## 2018-05-08 DIAGNOSIS — I7090 Unspecified atherosclerosis: Secondary | ICD-10-CM | POA: Diagnosis not present

## 2018-05-08 DIAGNOSIS — Z89512 Acquired absence of left leg below knee: Secondary | ICD-10-CM | POA: Diagnosis not present

## 2018-05-08 DIAGNOSIS — T8789 Other complications of amputation stump: Secondary | ICD-10-CM | POA: Diagnosis not present

## 2018-05-08 DIAGNOSIS — D649 Anemia, unspecified: Secondary | ICD-10-CM | POA: Diagnosis not present

## 2018-05-08 DIAGNOSIS — J449 Chronic obstructive pulmonary disease, unspecified: Secondary | ICD-10-CM | POA: Diagnosis not present

## 2018-05-08 DIAGNOSIS — K5790 Diverticulosis of intestine, part unspecified, without perforation or abscess without bleeding: Secondary | ICD-10-CM | POA: Diagnosis not present

## 2018-05-08 DIAGNOSIS — I7300 Raynaud's syndrome without gangrene: Secondary | ICD-10-CM | POA: Diagnosis not present

## 2018-05-09 ENCOUNTER — Encounter (INDEPENDENT_AMBULATORY_CARE_PROVIDER_SITE_OTHER): Payer: Self-pay | Admitting: Vascular Surgery

## 2018-05-09 ENCOUNTER — Ambulatory Visit (INDEPENDENT_AMBULATORY_CARE_PROVIDER_SITE_OTHER): Payer: Medicare HMO | Admitting: Vascular Surgery

## 2018-05-09 VITALS — BP 115/72 | HR 73 | Resp 18 | Ht 66.0 in | Wt 115.0 lb

## 2018-05-09 DIAGNOSIS — T8789 Other complications of amputation stump: Secondary | ICD-10-CM

## 2018-05-09 DIAGNOSIS — I70213 Atherosclerosis of native arteries of extremities with intermittent claudication, bilateral legs: Secondary | ICD-10-CM

## 2018-05-09 DIAGNOSIS — L89890 Pressure ulcer of other site, unstageable: Secondary | ICD-10-CM

## 2018-05-09 DIAGNOSIS — K219 Gastro-esophageal reflux disease without esophagitis: Secondary | ICD-10-CM

## 2018-05-09 DIAGNOSIS — E7800 Pure hypercholesterolemia, unspecified: Secondary | ICD-10-CM

## 2018-05-12 ENCOUNTER — Encounter (INDEPENDENT_AMBULATORY_CARE_PROVIDER_SITE_OTHER): Payer: Self-pay | Admitting: Vascular Surgery

## 2018-05-12 NOTE — Progress Notes (Signed)
Patient ID: Melissa [name], female   DOB: 1941-04-23, 77 y.o.   MRN: [id number]  Chief Complaint  Patient presents with  . Follow-up    6 week follow up    HPI Melissa [name] is a 77 y.o. female.    No left leg pain very happy with the improvement   Past Medical History:  Diagnosis Date  . Anemia 12/2017   blood transfusion after amputation  . Anxiety   . Arthritis    rheumatoid  . Ataxia   . Chronic ulcer of right great toe ([place]) 2019   slowly healing  . COPD (chronic obstructive pulmonary disease) ([place])   . Coronary artery disease 2018   complete blockage in one vessel, 40 % in another, 25% in 3rd vessell  . Depression   . Diverticulosis   . Dysrhythmia    atrial fib  . Excessive falling   . GERD (gastroesophageal reflux disease)    ulcerative colitis  . Hypercholesteremia   . Hyperlipidemia   . Hypertension   . IBS (irritable bowel syndrome)   . MRSA (methicillin resistant staph aureus) culture positive 11/2017   patient unaware of this diagnosis, was never informed or treated  . Myocardial infarction ([place]) 08/2017   had angio but no stents  . Osteoarthritis   . Psoriasis   . Raynaud's disease without gangrene   . Raynaud's disease without gangrene   . Shortness of breath dyspnea    with exertion  . 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 ABDOMINAL HYSTERECTOMY  1980  . AMPUTATION Left 12/12/2017   Procedure: AMPUTATION BELOW KNEE;  Surgeon: Katha Cabal, MD;  Location: [facility];  Service: Vascular;  Laterality: Left;  . AMPUTATION Left 02/08/2018   Procedure: AMPUTATION BELOW KNEE ( REVISION );  Surgeon: Katha Cabal, MD;  Location: [facility];  Service: Vascular;  Laterality: Left;  . APPENDECTOMY  1956  . APPLICATION OF WOUND VAC Left 01/25/2018   Procedure: APPLICATION OF WOUND VAC;  Surgeon: Katha Cabal, MD;  Location: [facility];  Service: Vascular;   Laterality: Left;  . APPLICATION OF WOUND VAC Left 03/08/2018   Procedure: APPLICATION OF WOUND VAC;  Surgeon: Katha Cabal, MD;  Location: [facility];  Service: Vascular;  Laterality: Left;  . BACK SURGERY  2013   Spinal fusion with rods and screws  . CHOLECYSTECTOMY    . COLONOSCOPY WITH PROPOFOL N/A 01/29/2017   Procedure: COLONOSCOPY WITH PROPOFOL;  Surgeon: Manya Silvas, MD;  Location: [facility] ENDOSCOPY;  Service: Endoscopy;  Laterality: N/A;  . ESOPHAGOGASTRODUODENOSCOPY (EGD) WITH PROPOFOL N/A 01/29/2017   Procedure: ESOPHAGOGASTRODUODENOSCOPY (EGD) WITH PROPOFOL;  Surgeon: Manya Silvas, MD;  Location: [facility] ENDOSCOPY;  Service: Endoscopy;  Laterality: N/A;  . EYE SURGERY Bilateral 2012   Cataract Extraction with IOL  . KNEE ARTHROSCOPY Left 2009   partial lateral meniscectomy, debridement, excision of Baker's cyst  . LEFT HEART CATH AND CORONARY ANGIOGRAPHY N/A 04/23/2017   Procedure: LEFT HEART CATH AND CORONARY ANGIOGRAPHY;  Surgeon: Teodoro Spray, MD;  Location: [place] CV LAB;  Service: Cardiovascular;  Laterality: N/A;  . LEFT HEART CATH AND CORONARY ANGIOGRAPHY Left 09/07/2017   Procedure: LEFT HEART CATH AND CORONARY ANGIOGRAPHY;  Surgeon: Teodoro Spray, MD;  Location: [place] CV LAB;  Service: Cardiovascular;  Laterality: Left;  . LOWER EXTREMITY ANGIOGRAPHY Left 11/27/2017   Procedure: LOWER  EXTREMITY ANGIOGRAPHY;  Surgeon: Katha Cabal, MD;  Location: [place] CV LAB;  Service: Cardiovascular;  Laterality: Left;  . LOWER EXTREMITY ANGIOGRAPHY Left 12/05/2017   Procedure: LOWER EXTREMITY ANGIOGRAPHY;  Surgeon: Katha Cabal, MD;  Location: [place] CV LAB;  Service: Cardiovascular;  Laterality: Left;  . ROTATOR CUFF REPAIR Right 1990  . TOTAL KNEE ARTHROPLASTY Left 04/04/2016   Procedure: TOTAL KNEE ARTHROPLASTY;  Surgeon: Hessie Knows, MD;  Location: [facility] ORS;  Service: Orthopedics;  Laterality: Left;  . WOUND DEBRIDEMENT Left  01/25/2018   Procedure: DEBRIDEMENT WOUND;  Surgeon: Katha Cabal, MD;  Location: [facility];  Service: Vascular;  Laterality: Left;  . WOUND DEBRIDEMENT Left 03/08/2018   Procedure: DEBRIDEMENT WOUND;  Surgeon: Katha Cabal, MD;  Location: [facility];  Service: Vascular;  Laterality: Left;      Allergies  Allergen Reactions  . Lodine [Etodolac] Hives and Itching  . Methotrexate Derivatives Other (See Comments)    Flu like symptoms  . Nitrofuran Derivatives Nausea And Vomiting and Other (See Comments)    Made sicker   . Levaquin [Levofloxacin] Itching, Rash and Other (See Comments)    Muscle pain  . Norvasc [Amlodipine] Other (See Comments)    Unknown   . Pentasa [Mesalamine Er] Other (See Comments)    Unknown   . Sulfa Antibiotics Itching and Rash  . Vioxx [Rofecoxib] Other (See Comments)    Unknown     Current Outpatient Medications  Medication Sig Dispense Refill  . acetaminophen (TYLENOL) 500 MG tablet Take 1,000 mg by mouth every 8 (eight) hours as needed for mild pain or headache.    Marland Kitchen apixaban (ELIQUIS) 5 MG TABS tablet Take 1 tablet (5 mg total) by mouth 2 (two) times daily. 60 tablet   . aspirin EC 81 MG tablet Take 1 tablet (81 mg total) by mouth daily.    . budesonide (ENTOCORT EC) 3 MG 24 hr capsule Take 3 capsules (9 mg total) by mouth daily. Taper down to 3 mg 30 capsule 0  . Calcium Carbonate-Vitamin D (CALCIUM 600+D) 600-400 MG-UNIT tablet Take 1 tablet by mouth 2 (two) times daily.     . clopidogrel (PLAVIX) 75 MG tablet TAKE 1 TABLET BY MOUTH EVERY DAY 90 tablet 1  . Cranberry 500 MG CAPS Take 500 mg by mouth daily.    Marland Kitchen diltiazem (CARDIZEM CD) 240 MG 24 hr capsule Take 240 mg by mouth daily.    . feeding supplement, ENSURE ENLIVE, (ENSURE ENLIVE) LIQD Take 237 mLs by mouth 2 (two) times daily between meals. 237 mL 12  . ferrous sulfate 325 (65 FE) MG tablet Take 325 mg by mouth daily with breakfast.    . gabapentin (NEURONTIN) 300 MG capsule TAKE ONE  CAPSULE BY MOUTH EVERY DAY. USE THIS MEDICATION FIRST 30 capsule 5  . gemfibrozil (LOPID) 600 MG tablet Take 600 mg by mouth 2 (two) times daily before a meal.    . HYDROcodone-acetaminophen (NORCO) 5-325 MG tablet Take 1-2 tablets by mouth every 6 (six) hours as needed for moderate pain or severe pain. 40 tablet 0  . hydroxychloroquine (PLAQUENIL) 200 MG tablet Take 200 mg by mouth 2 (two) times daily.     Marland Kitchen leflunomide (ARAVA) 20 MG tablet Take 20 mg by mouth daily.    Marland Kitchen lisinopril (PRINIVIL,ZESTRIL) 10 MG tablet Take 10 mg by mouth daily.    Marland Kitchen LORazepam (ATIVAN) 1 MG tablet Take 1 tablet (1 mg total) by mouth 2 (two) times  daily. 20 tablet 0  . Melatonin 3 MG TABS Take by mouth at bedtime.    . Multiple Vitamin (MULTIVITAMIN) tablet Take 1 tablet by mouth daily.    . Multiple Vitamins-Minerals (PRESERVISION AREDS) CAPS Take 1 capsule by mouth 2 (two) times daily.    Marland Kitchen omeprazole (PRILOSEC) 20 MG capsule Take 20 mg by mouth 2 (two) times daily before a meal.     . pravastatin (PRAVACHOL) 80 MG tablet Take 80 mg by mouth at bedtime.     . predniSONE (DELTASONE) 5 MG tablet Take 5 mg by mouth daily with breakfast.     . sertraline (ZOLOFT) 50 MG tablet Take 50 mg by mouth at bedtime.     Marland Kitchen tiZANidine (ZANAFLEX) 4 MG tablet Take 4 mg by mouth daily as needed for muscle spasms (neck pain).     . traMADol (ULTRAM) 50 MG tablet Take 1 tablet (50 mg total) by mouth every 6 (six) hours as needed for moderate pain. 28 tablet 2  . vancomycin (VANCOCIN) 250 MG capsule      No current facility-administered medications for this visit.         Physical Exam BP 115/72 (BP Location: Right Arm, Patient Position: Sitting)   Pulse 73   Resp 18   Ht 5' 6"  (1.676 m)   Wt 115 lb (52.2 kg)   BMI 18.56 kg/m  Gen:  WD/WN, NAD Skin: incision no 90+% healed suture line looks great    Assessment/Plan:  1. Pressure ulcer of BKA stump, unstageable ([place]) OK to move forward with prosthesis She has given  me permission to contact Melissa [name]  Follow up in 1 month  2. Atherosclerosis of native artery of both lower extremities with intermittent claudication (H[facility])  Recommend:  The patient has evidence of atherosclerosis of the lower extremities with claudication.  The patient does not voice lifestyle limiting changes at this point in time.  Noninvasive studies do not suggest clinically significant change.  No invasive studies, angiography or surgery at this time The patient should continue walking and begin a more formal exercise program.  The patient should continue antiplatelet therapy and aggressive treatment of the lipid abnormalities  No changes in the patient's medications at this time  The patient should continue wearing graduated compression socks 10-15 mmHg strength to control the mild edema.    3. Pure hypercholesterolemia Continue statin as ordered and reviewed, no changes at this time   4. Gastroesophageal reflux disease without esophagitis Continue PPI as already ordered, this medication has been reviewed and there are no changes at this time.  Avoidence of caffeine and alcohol  Moderate elevation of the head of the bed       [name] 05/12/2018, 10:52 AM   This note was created with Dragon medical transcription system.  Any errors from dictation are unintentional.

## 2018-05-15 DIAGNOSIS — I7300 Raynaud's syndrome without gangrene: Secondary | ICD-10-CM | POA: Diagnosis not present

## 2018-05-15 DIAGNOSIS — D649 Anemia, unspecified: Secondary | ICD-10-CM | POA: Diagnosis not present

## 2018-05-15 DIAGNOSIS — T8789 Other complications of amputation stump: Secondary | ICD-10-CM | POA: Diagnosis not present

## 2018-05-15 DIAGNOSIS — J449 Chronic obstructive pulmonary disease, unspecified: Secondary | ICD-10-CM | POA: Diagnosis not present

## 2018-05-15 DIAGNOSIS — Z89512 Acquired absence of left leg below knee: Secondary | ICD-10-CM | POA: Diagnosis not present

## 2018-05-15 DIAGNOSIS — M069 Rheumatoid arthritis, unspecified: Secondary | ICD-10-CM | POA: Diagnosis not present

## 2018-05-15 DIAGNOSIS — I7090 Unspecified atherosclerosis: Secondary | ICD-10-CM | POA: Diagnosis not present

## 2018-05-15 DIAGNOSIS — I10 Essential (primary) hypertension: Secondary | ICD-10-CM | POA: Diagnosis not present

## 2018-05-15 DIAGNOSIS — K5790 Diverticulosis of intestine, part unspecified, without perforation or abscess without bleeding: Secondary | ICD-10-CM | POA: Diagnosis not present

## 2018-05-20 ENCOUNTER — Telehealth (INDEPENDENT_AMBULATORY_CARE_PROVIDER_SITE_OTHER): Payer: Self-pay

## 2018-05-20 NOTE — Telephone Encounter (Signed)
I left a prescription on your desk for PT evaluation for [name]

## 2018-05-20 NOTE — Telephone Encounter (Signed)
I agree

## 2018-05-20 NOTE — Telephone Encounter (Signed)
Patient called to state that her family doesn't feel safe with her at home using the walker on her own. The patient wants to know if she can get an order written out on a script for her to have PT come out to her home to help her with walking with the walker, maybe for the next month or so?  Can I get this written on a script paper? I will fax it over to [facility] as this is the patient's preference .

## 2018-05-20 NOTE — Telephone Encounter (Signed)
Sent the prescription in to [facility] for the patient to get started with her PT.

## 2018-05-22 DIAGNOSIS — J449 Chronic obstructive pulmonary disease, unspecified: Secondary | ICD-10-CM | POA: Diagnosis not present

## 2018-05-22 DIAGNOSIS — I7090 Unspecified atherosclerosis: Secondary | ICD-10-CM | POA: Diagnosis not present

## 2018-05-22 DIAGNOSIS — K529 Noninfective gastroenteritis and colitis, unspecified: Secondary | ICD-10-CM | POA: Diagnosis not present

## 2018-05-22 DIAGNOSIS — Z89512 Acquired absence of left leg below knee: Secondary | ICD-10-CM | POA: Diagnosis not present

## 2018-05-22 DIAGNOSIS — M359 Systemic involvement of connective tissue, unspecified: Secondary | ICD-10-CM | POA: Diagnosis not present

## 2018-05-22 DIAGNOSIS — M069 Rheumatoid arthritis, unspecified: Secondary | ICD-10-CM | POA: Diagnosis not present

## 2018-05-22 DIAGNOSIS — M810 Age-related osteoporosis without current pathological fracture: Secondary | ICD-10-CM | POA: Diagnosis not present

## 2018-05-22 DIAGNOSIS — D649 Anemia, unspecified: Secondary | ICD-10-CM | POA: Diagnosis not present

## 2018-05-22 DIAGNOSIS — I10 Essential (primary) hypertension: Secondary | ICD-10-CM | POA: Diagnosis not present

## 2018-05-22 DIAGNOSIS — Z79899 Other long term (current) drug therapy: Secondary | ICD-10-CM | POA: Diagnosis not present

## 2018-05-22 DIAGNOSIS — K5790 Diverticulosis of intestine, part unspecified, without perforation or abscess without bleeding: Secondary | ICD-10-CM | POA: Diagnosis not present

## 2018-05-22 DIAGNOSIS — M1990 Unspecified osteoarthritis, unspecified site: Secondary | ICD-10-CM | POA: Diagnosis not present

## 2018-05-22 DIAGNOSIS — T8789 Other complications of amputation stump: Secondary | ICD-10-CM | POA: Diagnosis not present

## 2018-05-22 DIAGNOSIS — Z23 Encounter for immunization: Secondary | ICD-10-CM | POA: Diagnosis not present

## 2018-05-22 DIAGNOSIS — I7300 Raynaud's syndrome without gangrene: Secondary | ICD-10-CM | POA: Diagnosis not present

## 2018-05-27 DIAGNOSIS — I10 Essential (primary) hypertension: Secondary | ICD-10-CM | POA: Diagnosis not present

## 2018-05-27 DIAGNOSIS — K5790 Diverticulosis of intestine, part unspecified, without perforation or abscess without bleeding: Secondary | ICD-10-CM | POA: Diagnosis not present

## 2018-05-27 DIAGNOSIS — I7090 Unspecified atherosclerosis: Secondary | ICD-10-CM | POA: Diagnosis not present

## 2018-05-27 DIAGNOSIS — I7300 Raynaud's syndrome without gangrene: Secondary | ICD-10-CM | POA: Diagnosis not present

## 2018-05-27 DIAGNOSIS — D649 Anemia, unspecified: Secondary | ICD-10-CM | POA: Diagnosis not present

## 2018-05-27 DIAGNOSIS — J449 Chronic obstructive pulmonary disease, unspecified: Secondary | ICD-10-CM | POA: Diagnosis not present

## 2018-05-27 DIAGNOSIS — T8789 Other complications of amputation stump: Secondary | ICD-10-CM | POA: Diagnosis not present

## 2018-05-27 DIAGNOSIS — Z89512 Acquired absence of left leg below knee: Secondary | ICD-10-CM | POA: Diagnosis not present

## 2018-05-27 DIAGNOSIS — M069 Rheumatoid arthritis, unspecified: Secondary | ICD-10-CM | POA: Diagnosis not present

## 2018-05-28 ENCOUNTER — Telehealth (INDEPENDENT_AMBULATORY_CARE_PROVIDER_SITE_OTHER): Payer: Self-pay

## 2018-05-28 DIAGNOSIS — M069 Rheumatoid arthritis, unspecified: Secondary | ICD-10-CM | POA: Diagnosis not present

## 2018-05-28 DIAGNOSIS — K5790 Diverticulosis of intestine, part unspecified, without perforation or abscess without bleeding: Secondary | ICD-10-CM | POA: Diagnosis not present

## 2018-05-28 DIAGNOSIS — Z89512 Acquired absence of left leg below knee: Secondary | ICD-10-CM | POA: Diagnosis not present

## 2018-05-28 DIAGNOSIS — T8789 Other complications of amputation stump: Secondary | ICD-10-CM | POA: Diagnosis not present

## 2018-05-28 DIAGNOSIS — I7090 Unspecified atherosclerosis: Secondary | ICD-10-CM | POA: Diagnosis not present

## 2018-05-28 DIAGNOSIS — I10 Essential (primary) hypertension: Secondary | ICD-10-CM | POA: Diagnosis not present

## 2018-05-28 DIAGNOSIS — I7300 Raynaud's syndrome without gangrene: Secondary | ICD-10-CM | POA: Diagnosis not present

## 2018-05-28 DIAGNOSIS — D649 Anemia, unspecified: Secondary | ICD-10-CM | POA: Diagnosis not present

## 2018-05-28 DIAGNOSIS — J449 Chronic obstructive pulmonary disease, unspecified: Secondary | ICD-10-CM | POA: Diagnosis not present

## 2018-05-28 NOTE — Telephone Encounter (Signed)
Sure that is fine. Thanks.

## 2018-05-28 NOTE — Telephone Encounter (Signed)
PT guy called from [facility] to see if he can have a verbal order to give this patient PT twice a week for 5 weeks in her home?  If so, I will call him back to give him the verbal.

## 2018-05-28 NOTE — Telephone Encounter (Signed)
Called Melissa [name] back to let him know that the order for PT for twice a week for 5 weeks was okay and approved.

## 2018-05-28 NOTE — Telephone Encounter (Signed)
Agree with PT

## 2018-05-29 ENCOUNTER — Ambulatory Visit: Payer: Medicare HMO | Admitting: Pharmacy Technician

## 2018-05-29 DIAGNOSIS — Z89512 Acquired absence of left leg below knee: Secondary | ICD-10-CM | POA: Diagnosis not present

## 2018-05-29 DIAGNOSIS — T8789 Other complications of amputation stump: Secondary | ICD-10-CM | POA: Diagnosis not present

## 2018-05-29 DIAGNOSIS — I7300 Raynaud's syndrome without gangrene: Secondary | ICD-10-CM | POA: Diagnosis not present

## 2018-05-29 DIAGNOSIS — D649 Anemia, unspecified: Secondary | ICD-10-CM | POA: Diagnosis not present

## 2018-05-29 DIAGNOSIS — I10 Essential (primary) hypertension: Secondary | ICD-10-CM | POA: Diagnosis not present

## 2018-05-29 DIAGNOSIS — K5790 Diverticulosis of intestine, part unspecified, without perforation or abscess without bleeding: Secondary | ICD-10-CM | POA: Diagnosis not present

## 2018-05-29 DIAGNOSIS — J449 Chronic obstructive pulmonary disease, unspecified: Secondary | ICD-10-CM | POA: Diagnosis not present

## 2018-05-29 DIAGNOSIS — M069 Rheumatoid arthritis, unspecified: Secondary | ICD-10-CM | POA: Diagnosis not present

## 2018-05-29 DIAGNOSIS — I7090 Unspecified atherosclerosis: Secondary | ICD-10-CM | POA: Diagnosis not present

## 2018-05-30 ENCOUNTER — Telehealth (INDEPENDENT_AMBULATORY_CARE_PROVIDER_SITE_OTHER): Payer: Self-pay

## 2018-05-30 DIAGNOSIS — Z89512 Acquired absence of left leg below knee: Secondary | ICD-10-CM | POA: Diagnosis not present

## 2018-05-30 DIAGNOSIS — G8929 Other chronic pain: Secondary | ICD-10-CM | POA: Diagnosis not present

## 2018-05-30 DIAGNOSIS — M6281 Muscle weakness (generalized): Secondary | ICD-10-CM | POA: Diagnosis not present

## 2018-05-30 NOTE — Telephone Encounter (Signed)
Tye Maryland called stating that she needs a prescription and a demographics page on this patient to get her prosthesis ordered.  I am going to get Dr. Delana Meyer to write the prescription today.

## 2018-05-31 DIAGNOSIS — I10 Essential (primary) hypertension: Secondary | ICD-10-CM | POA: Diagnosis not present

## 2018-05-31 DIAGNOSIS — I7090 Unspecified atherosclerosis: Secondary | ICD-10-CM | POA: Diagnosis not present

## 2018-05-31 DIAGNOSIS — T8789 Other complications of amputation stump: Secondary | ICD-10-CM | POA: Diagnosis not present

## 2018-05-31 DIAGNOSIS — Z89512 Acquired absence of left leg below knee: Secondary | ICD-10-CM | POA: Diagnosis not present

## 2018-05-31 DIAGNOSIS — I7300 Raynaud's syndrome without gangrene: Secondary | ICD-10-CM | POA: Diagnosis not present

## 2018-05-31 DIAGNOSIS — M069 Rheumatoid arthritis, unspecified: Secondary | ICD-10-CM | POA: Diagnosis not present

## 2018-05-31 DIAGNOSIS — K5790 Diverticulosis of intestine, part unspecified, without perforation or abscess without bleeding: Secondary | ICD-10-CM | POA: Diagnosis not present

## 2018-05-31 DIAGNOSIS — J449 Chronic obstructive pulmonary disease, unspecified: Secondary | ICD-10-CM | POA: Diagnosis not present

## 2018-05-31 DIAGNOSIS — D649 Anemia, unspecified: Secondary | ICD-10-CM | POA: Diagnosis not present

## 2018-06-04 DIAGNOSIS — J449 Chronic obstructive pulmonary disease, unspecified: Secondary | ICD-10-CM | POA: Diagnosis not present

## 2018-06-04 DIAGNOSIS — T8789 Other complications of amputation stump: Secondary | ICD-10-CM | POA: Diagnosis not present

## 2018-06-04 DIAGNOSIS — K5790 Diverticulosis of intestine, part unspecified, without perforation or abscess without bleeding: Secondary | ICD-10-CM | POA: Diagnosis not present

## 2018-06-04 DIAGNOSIS — M069 Rheumatoid arthritis, unspecified: Secondary | ICD-10-CM | POA: Diagnosis not present

## 2018-06-04 DIAGNOSIS — I7300 Raynaud's syndrome without gangrene: Secondary | ICD-10-CM | POA: Diagnosis not present

## 2018-06-04 DIAGNOSIS — I7090 Unspecified atherosclerosis: Secondary | ICD-10-CM | POA: Diagnosis not present

## 2018-06-04 DIAGNOSIS — D649 Anemia, unspecified: Secondary | ICD-10-CM | POA: Diagnosis not present

## 2018-06-04 DIAGNOSIS — I10 Essential (primary) hypertension: Secondary | ICD-10-CM | POA: Diagnosis not present

## 2018-06-04 DIAGNOSIS — Z89512 Acquired absence of left leg below knee: Secondary | ICD-10-CM | POA: Diagnosis not present

## 2018-06-05 DIAGNOSIS — K5790 Diverticulosis of intestine, part unspecified, without perforation or abscess without bleeding: Secondary | ICD-10-CM | POA: Diagnosis not present

## 2018-06-05 DIAGNOSIS — M069 Rheumatoid arthritis, unspecified: Secondary | ICD-10-CM | POA: Diagnosis not present

## 2018-06-05 DIAGNOSIS — I7300 Raynaud's syndrome without gangrene: Secondary | ICD-10-CM | POA: Diagnosis not present

## 2018-06-05 DIAGNOSIS — D649 Anemia, unspecified: Secondary | ICD-10-CM | POA: Diagnosis not present

## 2018-06-05 DIAGNOSIS — J449 Chronic obstructive pulmonary disease, unspecified: Secondary | ICD-10-CM | POA: Diagnosis not present

## 2018-06-05 DIAGNOSIS — I7090 Unspecified atherosclerosis: Secondary | ICD-10-CM | POA: Diagnosis not present

## 2018-06-05 DIAGNOSIS — I10 Essential (primary) hypertension: Secondary | ICD-10-CM | POA: Diagnosis not present

## 2018-06-05 DIAGNOSIS — T8789 Other complications of amputation stump: Secondary | ICD-10-CM | POA: Diagnosis not present

## 2018-06-05 DIAGNOSIS — Z89512 Acquired absence of left leg below knee: Secondary | ICD-10-CM | POA: Diagnosis not present

## 2018-06-07 DIAGNOSIS — K5790 Diverticulosis of intestine, part unspecified, without perforation or abscess without bleeding: Secondary | ICD-10-CM | POA: Diagnosis not present

## 2018-06-07 DIAGNOSIS — I7090 Unspecified atherosclerosis: Secondary | ICD-10-CM | POA: Diagnosis not present

## 2018-06-07 DIAGNOSIS — Z89512 Acquired absence of left leg below knee: Secondary | ICD-10-CM | POA: Diagnosis not present

## 2018-06-07 DIAGNOSIS — D649 Anemia, unspecified: Secondary | ICD-10-CM | POA: Diagnosis not present

## 2018-06-07 DIAGNOSIS — J449 Chronic obstructive pulmonary disease, unspecified: Secondary | ICD-10-CM | POA: Diagnosis not present

## 2018-06-07 DIAGNOSIS — M069 Rheumatoid arthritis, unspecified: Secondary | ICD-10-CM | POA: Diagnosis not present

## 2018-06-07 DIAGNOSIS — T8789 Other complications of amputation stump: Secondary | ICD-10-CM | POA: Diagnosis not present

## 2018-06-07 DIAGNOSIS — I7300 Raynaud's syndrome without gangrene: Secondary | ICD-10-CM | POA: Diagnosis not present

## 2018-06-07 DIAGNOSIS — I10 Essential (primary) hypertension: Secondary | ICD-10-CM | POA: Diagnosis not present

## 2018-06-10 ENCOUNTER — Other Ambulatory Visit (INDEPENDENT_AMBULATORY_CARE_PROVIDER_SITE_OTHER): Payer: Self-pay | Admitting: Vascular Surgery

## 2018-06-10 ENCOUNTER — Telehealth (INDEPENDENT_AMBULATORY_CARE_PROVIDER_SITE_OTHER): Payer: Self-pay

## 2018-06-10 DIAGNOSIS — K5790 Diverticulosis of intestine, part unspecified, without perforation or abscess without bleeding: Secondary | ICD-10-CM | POA: Diagnosis not present

## 2018-06-10 DIAGNOSIS — M79674 Pain in right toe(s): Secondary | ICD-10-CM

## 2018-06-10 DIAGNOSIS — M069 Rheumatoid arthritis, unspecified: Secondary | ICD-10-CM | POA: Diagnosis not present

## 2018-06-10 DIAGNOSIS — L819 Disorder of pigmentation, unspecified: Secondary | ICD-10-CM

## 2018-06-10 DIAGNOSIS — I7300 Raynaud's syndrome without gangrene: Secondary | ICD-10-CM | POA: Diagnosis not present

## 2018-06-10 DIAGNOSIS — D649 Anemia, unspecified: Secondary | ICD-10-CM | POA: Diagnosis not present

## 2018-06-10 DIAGNOSIS — J449 Chronic obstructive pulmonary disease, unspecified: Secondary | ICD-10-CM | POA: Diagnosis not present

## 2018-06-10 DIAGNOSIS — T8789 Other complications of amputation stump: Secondary | ICD-10-CM | POA: Diagnosis not present

## 2018-06-10 DIAGNOSIS — I10 Essential (primary) hypertension: Secondary | ICD-10-CM | POA: Diagnosis not present

## 2018-06-10 DIAGNOSIS — I7090 Unspecified atherosclerosis: Secondary | ICD-10-CM | POA: Diagnosis not present

## 2018-06-10 DIAGNOSIS — Z89512 Acquired absence of left leg below knee: Secondary | ICD-10-CM | POA: Diagnosis not present

## 2018-06-10 NOTE — Telephone Encounter (Signed)
Patient called stating for the week her big toe has been red with soreness,painful,some toe coolness,and little yellow drainage.She stated that she has been using a Band-Aid with neosporin.The patient wanted to know if she come in the office or go to podiatry.I spoke with Schnier and he advise for the patient to call podiatry and come in our office for abi's this week and if she need to be seen she should come in Monday 06/17/18 and see Schnier.Patient has been advise with all information

## 2018-06-11 ENCOUNTER — Ambulatory Visit (INDEPENDENT_AMBULATORY_CARE_PROVIDER_SITE_OTHER): Payer: Medicare HMO

## 2018-06-11 DIAGNOSIS — L819 Disorder of pigmentation, unspecified: Secondary | ICD-10-CM

## 2018-06-11 DIAGNOSIS — M79674 Pain in right toe(s): Secondary | ICD-10-CM

## 2018-06-12 DIAGNOSIS — E7800 Pure hypercholesterolemia, unspecified: Secondary | ICD-10-CM | POA: Diagnosis not present

## 2018-06-12 DIAGNOSIS — Z79899 Other long term (current) drug therapy: Secondary | ICD-10-CM | POA: Diagnosis not present

## 2018-06-12 DIAGNOSIS — E119 Type 2 diabetes mellitus without complications: Secondary | ICD-10-CM | POA: Diagnosis not present

## 2018-06-12 DIAGNOSIS — I10 Essential (primary) hypertension: Secondary | ICD-10-CM | POA: Diagnosis not present

## 2018-06-13 DIAGNOSIS — I7090 Unspecified atherosclerosis: Secondary | ICD-10-CM | POA: Diagnosis not present

## 2018-06-13 DIAGNOSIS — I7300 Raynaud's syndrome without gangrene: Secondary | ICD-10-CM | POA: Diagnosis not present

## 2018-06-13 DIAGNOSIS — M069 Rheumatoid arthritis, unspecified: Secondary | ICD-10-CM | POA: Diagnosis not present

## 2018-06-13 DIAGNOSIS — Z89512 Acquired absence of left leg below knee: Secondary | ICD-10-CM | POA: Diagnosis not present

## 2018-06-13 DIAGNOSIS — J449 Chronic obstructive pulmonary disease, unspecified: Secondary | ICD-10-CM | POA: Diagnosis not present

## 2018-06-13 DIAGNOSIS — K5790 Diverticulosis of intestine, part unspecified, without perforation or abscess without bleeding: Secondary | ICD-10-CM | POA: Diagnosis not present

## 2018-06-13 DIAGNOSIS — D649 Anemia, unspecified: Secondary | ICD-10-CM | POA: Diagnosis not present

## 2018-06-13 DIAGNOSIS — T8789 Other complications of amputation stump: Secondary | ICD-10-CM | POA: Diagnosis not present

## 2018-06-13 DIAGNOSIS — I10 Essential (primary) hypertension: Secondary | ICD-10-CM | POA: Diagnosis not present

## 2018-06-17 ENCOUNTER — Encounter (INDEPENDENT_AMBULATORY_CARE_PROVIDER_SITE_OTHER): Payer: Self-pay | Admitting: Vascular Surgery

## 2018-06-17 ENCOUNTER — Ambulatory Visit (INDEPENDENT_AMBULATORY_CARE_PROVIDER_SITE_OTHER): Payer: Medicare HMO | Admitting: Vascular Surgery

## 2018-06-17 VITALS — BP 124/51 | HR 76 | Resp 16

## 2018-06-17 DIAGNOSIS — K219 Gastro-esophageal reflux disease without esophagitis: Secondary | ICD-10-CM | POA: Diagnosis not present

## 2018-06-17 DIAGNOSIS — T8789 Other complications of amputation stump: Secondary | ICD-10-CM | POA: Diagnosis not present

## 2018-06-17 DIAGNOSIS — K5790 Diverticulosis of intestine, part unspecified, without perforation or abscess without bleeding: Secondary | ICD-10-CM | POA: Diagnosis not present

## 2018-06-17 DIAGNOSIS — I998 Other disorder of circulatory system: Secondary | ICD-10-CM

## 2018-06-17 DIAGNOSIS — D649 Anemia, unspecified: Secondary | ICD-10-CM | POA: Diagnosis not present

## 2018-06-17 DIAGNOSIS — M069 Rheumatoid arthritis, unspecified: Secondary | ICD-10-CM | POA: Diagnosis not present

## 2018-06-17 DIAGNOSIS — I70221 Atherosclerosis of native arteries of extremities with rest pain, right leg: Secondary | ICD-10-CM

## 2018-06-17 DIAGNOSIS — Z89512 Acquired absence of left leg below knee: Secondary | ICD-10-CM | POA: Diagnosis not present

## 2018-06-17 DIAGNOSIS — J449 Chronic obstructive pulmonary disease, unspecified: Secondary | ICD-10-CM | POA: Diagnosis not present

## 2018-06-17 DIAGNOSIS — I10 Essential (primary) hypertension: Secondary | ICD-10-CM

## 2018-06-17 DIAGNOSIS — E7800 Pure hypercholesterolemia, unspecified: Secondary | ICD-10-CM

## 2018-06-17 DIAGNOSIS — I7090 Unspecified atherosclerosis: Secondary | ICD-10-CM | POA: Diagnosis not present

## 2018-06-17 DIAGNOSIS — I7300 Raynaud's syndrome without gangrene: Secondary | ICD-10-CM | POA: Diagnosis not present

## 2018-06-17 NOTE — Progress Notes (Signed)
MRN : [id number]  Melissa [name] is a 77 y.o. (05-20-1941) female who presents with chief complaint of  Chief Complaint  Patient presents with  . Follow-up    ultasound follow up  .  History of Present Illness: The patient returns to the office for followup and review of the noninvasive studies. There has been a significant deterioration in the right lower extremity symptoms.  The patient notes interval shortening of their claudication distance and development of severe rest pain symptoms. No new ulcers or wounds have occurred since the last visit but the great toe is now dusky.  There have been no significant changes to the patient's overall health care. 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  The patient denies amaurosis fugax or recent TIA symptoms. There are no recent neurological changes noted. The patient denies history of DVT, PE or superficial thrombophlebitis. The patient denies recent episodes of angina or shortness of breath.   ABI's Rt=0.22 and Lt=BKA   Current Meds  Medication Sig  . acetaminophen (TYLENOL) 500 MG tablet Take 1,000 mg by mouth every 8 (eight) hours as needed for mild pain or headache.  Marland Kitchen apixaban (ELIQUIS) 5 MG TABS tablet Take 1 tablet (5 mg total) by mouth 2 (two) times daily.  Marland Kitchen aspirin EC 81 MG tablet Take 1 tablet (81 mg total) by mouth daily.  . budesonide (ENTOCORT EC) 3 MG 24 hr capsule Take 3 capsules (9 mg total) by mouth daily. Taper down to 3 mg  . Calcium Carbonate-Vitamin D (CALCIUM 600+D) 600-400 MG-UNIT tablet Take 1 tablet by mouth 2 (two) times daily.   . clopidogrel (PLAVIX) 75 MG tablet TAKE 1 TABLET BY MOUTH EVERY DAY  . Cranberry 500 MG CAPS Take 500 mg by mouth daily.  Marland Kitchen diltiazem (CARDIZEM CD) 240 MG 24 hr capsule Take 240 mg by mouth daily.  . feeding supplement, ENSURE ENLIVE, (ENSURE ENLIVE) LIQD Take 237 mLs by mouth 2 (two) times daily between meals.  . ferrous sulfate 325 (65 FE) MG tablet Take 325 mg by mouth daily with breakfast.  .  gabapentin (NEURONTIN) 300 MG capsule TAKE ONE CAPSULE BY MOUTH EVERY DAY. USE THIS MEDICATION FIRST  . gemfibrozil (LOPID) 600 MG tablet Take 600 mg by mouth 2 (two) times daily before a meal.  . HYDROcodone-acetaminophen (NORCO) 5-325 MG tablet Take 1-2 tablets by mouth every 6 (six) hours as needed for moderate pain or severe pain.  . hydroxychloroquine (PLAQUENIL) 200 MG tablet Take 200 mg by mouth 2 (two) times daily.   Marland Kitchen leflunomide (ARAVA) 20 MG tablet Take 20 mg by mouth daily.  Marland Kitchen lisinopril (PRINIVIL,ZESTRIL) 10 MG tablet Take 10 mg by mouth daily.  Marland Kitchen LORazepam (ATIVAN) 1 MG tablet Take 1 tablet (1 mg total) by mouth 2 (two) times daily.  . Melatonin 3 MG TABS Take by mouth at bedtime.  . Multiple Vitamin (MULTIVITAMIN) tablet Take 1 tablet by mouth daily.  . Multiple Vitamins-Minerals (PRESERVISION AREDS) CAPS Take 1 capsule by mouth 2 (two) times daily.  Marland Kitchen omeprazole (PRILOSEC) 20 MG capsule Take 20 mg by mouth 2 (two) times daily before a meal.   . pravastatin (PRAVACHOL) 80 MG tablet Take 80 mg by mouth at bedtime.   . predniSONE (DELTASONE) 5 MG tablet Take 5 mg by mouth daily with breakfast.   . sertraline (ZOLOFT) 50 MG tablet Take 50 mg by mouth at bedtime.   Marland Kitchen tiZANidine (ZANAFLEX) 4 MG tablet Take 4 mg by mouth daily as needed  for muscle spasms (neck pain).   . traMADol (ULTRAM) 50 MG tablet Take 1 tablet (50 mg total) by mouth every 6 (six) hours as needed for moderate pain.  . vancomycin (VANCOCIN) 250 MG capsule     Past Medical History:  Diagnosis Date  . Anemia 12/2017   blood transfusion after amputation  . Anxiety   . Arthritis    rheumatoid  . Ataxia   . Chronic ulcer of right great toe ([place]) 2019   slowly healing  . COPD (chronic obstructive pulmonary disease) ([place])   . Coronary artery disease 2018   complete blockage in one vessel, 40 % in another, 25% in 3rd vessell  . Depression   . Diverticulosis   . Dysrhythmia    atrial fib  . Excessive falling     . GERD (gastroesophageal reflux disease)    ulcerative colitis  . Hypercholesteremia   . Hyperlipidemia   . Hypertension   . IBS (irritable bowel syndrome)   . MRSA (methicillin resistant staph aureus) culture positive 11/2017   patient unaware of this diagnosis, was never informed or treated  . Myocardial infarction ([place]) 08/2017   had angio but no stents  . Osteoarthritis   . Psoriasis   . Raynaud's disease without gangrene   . Raynaud's disease without gangrene   . Shortness of breath dyspnea    with exertion  . 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 ABDOMINAL HYSTERECTOMY  1980  . AMPUTATION Left 12/12/2017   Procedure: AMPUTATION BELOW KNEE;  Surgeon: Katha Cabal, MD;  Location: [facility] ORS;  Service: Vascular;  Laterality: Left;  . AMPUTATION Left 02/08/2018   Procedure: AMPUTATION BELOW KNEE ( REVISION );  Surgeon: Katha Cabal, MD;  Location: [facility];  Service: Vascular;  Laterality: Left;  . APPENDECTOMY  1956  . APPLICATION OF WOUND VAC Left 01/25/2018   Procedure: APPLICATION OF WOUND VAC;  Surgeon: Katha Cabal, MD;  Location: [facility];  Service: Vascular;  Laterality: Left;  . APPLICATION OF WOUND VAC Left 03/08/2018   Procedure: APPLICATION OF WOUND VAC;  Surgeon: Katha Cabal, MD;  Location: [facility];  Service: Vascular;  Laterality: Left;  . BACK SURGERY  2013   Spinal fusion with rods and screws  . CHOLECYSTECTOMY    . COLONOSCOPY WITH PROPOFOL N/A 01/29/2017   Procedure: COLONOSCOPY WITH PROPOFOL;  Surgeon: Manya Silvas, MD;  Location[facility] END[facility];  Service: Endoscopy;  Laterality: N/A;  . ESOPHAGOGASTRODUODENOSCOPY (EGD) WITH PROPOFOL N/A 01/29/2017   Procedure: ESOPHAGOGASTRODUODENOSCOPY (EGD) WITH PROPOFOL;  Surgeon: Manya Silvas, MD;  Location: [facility] ENDOSCOPY;  Service: Endoscopy;  Laterality: N/A;  . EYE SURGERY Bilateral 2012   Cataract  Extraction with IOL  . KNEE ARTHROSCOPY Left 2009   partial lateral meniscectomy, debridement, excision of Baker's cyst  . LEFT HEART CATH AND CORONARY ANGIOGRAPHY N/A 04/23/2017   Procedure: LEFT HEART CATH AND CORONARY ANGIOGRAPHY;  Surgeon: Teodoro Spray, MD;  Location: [place] CV LAB;  Service: Cardiovascular;  Laterality: N/A;  . LEFT HEART CATH AND CORONARY ANGIOGRAPHY Left 09/07/2017   Procedure: LEFT HEART CATH AND CORONARY ANGIOGRAPHY;  Surgeon: Teodoro Spray, MD;  Location: [place] [facility];  Service: Cardiovascular;  Laterality: Left;  . LOWER EXTREMITY ANGIOGRAPHY Left 11/27/2017   Procedure: LOWER EXTREMITY ANGIOGRAPHY;  Surgeon: Katha Cabal, MD;  Location: [place] [facility];  Service: Cardiovascular;  Laterality: Left;  . LOWER EXTREMITY ANGIOGRAPHY Left 12/05/2017   Procedure: LOWER EXTREMITY ANGIOGRAPHY;  Surgeon: Katha Cabal, MD;  Location: [place] [facility];  Service: Cardiovascular;  Laterality: Left;  . ROTATOR CUFF REPAIR Right 1990  . TOTAL KNEE ARTHROPLASTY Left 04/04/2016   Procedure: TOTAL KNEE ARTHROPLASTY;  Surgeon: Hessie Knows, MD;  Location: [facility] OR[facility];  Service: Orthopedics;  Laterality: Left;  . WOUND DEBRIDEMENT Left 01/25/2018   Procedure: DEBRIDEMENT WOUND;  Surgeon: Katha Cabal, MD;  Location: [facility] ORS;  Service: Vascular;  Laterality: Left;  . WOUND DEBRIDEMENT Left 03/08/2018   Procedure: DEBRIDEMENT WOUND;  Surgeon: Katha Cabal, MD;  Location: [facility] ORS;  Service: Vascular;  Laterality: Left;    Social History Social History   Tobacco Use  . Smoking status: Never Smoker  . Smokeless tobacco: Never Used  Substance Use Topics  . Alcohol use: No  . Drug use: No    Family History Family History  Problem Relation Age of Onset  . CAD Mother   . CAD Father   . Alzheimer's disease Sister   . CAD Sister   . CAD Brother   . Breast cancer Neg Hx     Allergies  Allergen Reactions  . Lodine [Etodolac] Hives  and Itching  . Methotrexate Derivatives Other (See Comments)    Flu like symptoms  . Nitrofuran Derivatives Nausea And Vomiting and Other (See Comments)    Made sicker   . Levaquin [Levofloxacin] Itching, Rash and Other (See Comments)    Muscle pain  . Norvasc [Amlodipine] Other (See Comments)    Unknown   . Pentasa [Mesalamine Er] Other (See Comments)    Unknown   . Sulfa Antibiotics Itching and Rash  . Vioxx [Rofecoxib] Other (See Comments)    Unknown      REVIEW OF SYSTEMS (Negative unless checked)  Constitutional: [] Weight loss  [] Fever  [] Chills Cardiac: [] Chest pain   [] Chest pressure   [] Palpitations   [] Shortness of breath when laying flat   [] Shortness of breath with exertion. Vascular:  [x] Pain in legs with walking   [x] Pain in legs at rest  [] History of DVT   [] Phlebitis   [] Swelling in legs   [] Varicose veins   [] Non-healing ulcers Pulmonary:   [] Uses home oxygen   [] Productive cough   [] Hemoptysis   [] Wheeze  [] COPD   [] Asthma Neurologic:  [] Dizziness   [] Seizures   [] History of stroke   [] History of TIA  [] Aphasia   [] Vissual changes   [] Weakness or numbness in arm   [] Weakness or numbness in leg Musculoskeletal:   [] Joint swelling   [] Joint pain   [] Low back pain Hematologic:  [] Easy bruising  [] Easy bleeding   [] Hypercoagulable state   [] Anemic Gastrointestinal:  [] Diarrhea   [] Vomiting  [] Gastroesophageal reflux/heartburn   [] Difficulty swallowing. Genitourinary:  [] Chronic kidney disease   [] Difficult urination  [] Frequent urination   [] Blood in urine Skin:  [] Rashes   [] Ulcers  Psychological:  [] History of anxiety   []  History of major depression.  Physical Examination  Vitals:   06/17/18 1607  BP: (!) 124/51  Pulse: 76  Resp: 16   There is no height or weight on file to calculate BMI. Gen: WD/WN, NAD Head: [place]/AT, No temporalis wasting.  Ear/Nose/Throat: Hearing grossly intact, nares w/o erythema or drainage Eyes: PER, EOMI, sclera nonicteric.  Neck:  Supple, no large masses.   Pulmonary:  Good air movement, no audible wheezing bilaterally, no use of accessory muscles.  Cardiac:  RRR, no JVD Vascular:  preulcerative lesion noted right great toe Vessel Right Left  Radial Palpable Palpable  PT Not Palpable BKA  DP Not Palpable BKA  Gastrointestinal: Non-distended. No guarding/no peritoneal signs.  Musculoskeletal: M/S 5/5 throughout.  No deformity of the right leg but marked calf atrophy.; left s/p BKA  Neurologic: CN 2-12 intact. Symmetrical.  Speech is fluent. Motor exam as listed above. Psychiatric: Judgment intact, Mood & affect appropriate for pt's clinical situation. Dermatologic: No rashes or ulcers noted.  No changes consistent with cellulitis. Lymph : No lichenification or skin changes of chronic lymphedema.  CBC Lab Results  Component Value Date   WBC 11.0 03/08/2018   HGB 10.9 (L) 03/08/2018   HCT 32.2 (L) 03/08/2018   MCV 93.6 03/08/2018   PLT 290 03/08/2018    BMET    Component Value Date/Time   NA 140 03/08/2018 0930   NA 139 06/16/2013 1031   K 3.9 03/08/2018 0930   K 3.8 06/16/2013 1031   CL 106 03/08/2018 0930   CL 105 06/16/2013 1031   CO2 25 03/08/2018 0930   CO2 30 06/16/2013 1031   GLUCOSE 129 (H) 03/08/2018 0930   GLUCOSE 96 06/16/2013 1031   BUN 31 (H) 03/08/2018 0930   BUN 23 (H) 06/16/2013 1031   CREATININE 0.50 03/08/2018 0930   CREATININE 0.58 (L) 06/16/2013 1031   CALCIUM 9.2 03/08/2018 0930   CALCIUM 9.7 06/16/2013 1031   GFRNONAA >60 03/08/2018 0930   GFRNONAA >60 06/16/2013 1031   GFRAA >60 03/08/2018 0930   GFRAA >60 06/16/2013 1031   CrCl cannot be calculated (Patient's most recent lab result is older than the maximum 21 days allowed.).  COAG Lab Results  Component Value Date   INR 1.12 02/08/2018   INR 1.21 01/21/2018   INR 1.36 12/14/2017    Radiology No results found.    Assessment/Plan 1. Ischemic foot Recommend:  The patient has evidence of severe  atherosclerotic changes of the right lower extremities with rest pain that is associated with preulcerative changes and impending tissue loss of the right foot.  This represents a limb threatening ischemia and places the patient at the risk for limb loss.  Patient should undergo angiography of the right lower extremities with the hope for intervention for limb salvage.  The risks and benefits as well as the alternative therapies was discussed in detail with the patient.  All questions were answered.  Patient agrees to proceed with angiography right leg.  The patient will follow up with me in the office after the procedure.    2. Atherosclerosis of native artery of right lower extremity with rest pain ([place]) See #1  3. Essential hypertension Continue antihypertensive medications as already ordered, these medications have been reviewed and there are no changes at this time.   4. Gastroesophageal reflux disease without esophagitis Continue PPI as already ordered, this medication has been reviewed and there are no changes at this time.  Avoidence of caffeine and alcohol  Moderate elevation of the head of the bed   5. Pure hypercholesterolemia Continue statin as ordered and reviewed, no changes at this time    [name], MD  06/17/2018 4:49 PM

## 2018-06-19 ENCOUNTER — Encounter (INDEPENDENT_AMBULATORY_CARE_PROVIDER_SITE_OTHER): Payer: Self-pay | Admitting: Vascular Surgery

## 2018-06-19 DIAGNOSIS — D649 Anemia, unspecified: Secondary | ICD-10-CM | POA: Diagnosis not present

## 2018-06-19 DIAGNOSIS — K5790 Diverticulosis of intestine, part unspecified, without perforation or abscess without bleeding: Secondary | ICD-10-CM | POA: Diagnosis not present

## 2018-06-19 DIAGNOSIS — M069 Rheumatoid arthritis, unspecified: Secondary | ICD-10-CM | POA: Diagnosis not present

## 2018-06-19 DIAGNOSIS — I7300 Raynaud's syndrome without gangrene: Secondary | ICD-10-CM | POA: Diagnosis not present

## 2018-06-19 DIAGNOSIS — J449 Chronic obstructive pulmonary disease, unspecified: Secondary | ICD-10-CM | POA: Diagnosis not present

## 2018-06-19 DIAGNOSIS — Z89512 Acquired absence of left leg below knee: Secondary | ICD-10-CM | POA: Diagnosis not present

## 2018-06-19 DIAGNOSIS — T8789 Other complications of amputation stump: Secondary | ICD-10-CM | POA: Diagnosis not present

## 2018-06-19 DIAGNOSIS — I10 Essential (primary) hypertension: Secondary | ICD-10-CM | POA: Diagnosis not present

## 2018-06-19 DIAGNOSIS — I7090 Unspecified atherosclerosis: Secondary | ICD-10-CM | POA: Diagnosis not present

## 2018-06-20 ENCOUNTER — Other Ambulatory Visit (INDEPENDENT_AMBULATORY_CARE_PROVIDER_SITE_OTHER): Payer: Self-pay | Admitting: Nurse Practitioner

## 2018-06-20 DIAGNOSIS — I7300 Raynaud's syndrome without gangrene: Secondary | ICD-10-CM | POA: Diagnosis not present

## 2018-06-20 DIAGNOSIS — E7800 Pure hypercholesterolemia, unspecified: Secondary | ICD-10-CM | POA: Diagnosis not present

## 2018-06-20 DIAGNOSIS — J410 Simple chronic bronchitis: Secondary | ICD-10-CM | POA: Diagnosis not present

## 2018-06-20 DIAGNOSIS — T8789 Other complications of amputation stump: Secondary | ICD-10-CM | POA: Diagnosis not present

## 2018-06-20 DIAGNOSIS — I7090 Unspecified atherosclerosis: Secondary | ICD-10-CM | POA: Diagnosis not present

## 2018-06-20 DIAGNOSIS — I480 Paroxysmal atrial fibrillation: Secondary | ICD-10-CM | POA: Diagnosis not present

## 2018-06-20 DIAGNOSIS — I10 Essential (primary) hypertension: Secondary | ICD-10-CM | POA: Diagnosis not present

## 2018-06-20 DIAGNOSIS — K5790 Diverticulosis of intestine, part unspecified, without perforation or abscess without bleeding: Secondary | ICD-10-CM | POA: Diagnosis not present

## 2018-06-20 DIAGNOSIS — J449 Chronic obstructive pulmonary disease, unspecified: Secondary | ICD-10-CM | POA: Diagnosis not present

## 2018-06-20 DIAGNOSIS — D649 Anemia, unspecified: Secondary | ICD-10-CM | POA: Diagnosis not present

## 2018-06-20 DIAGNOSIS — E119 Type 2 diabetes mellitus without complications: Secondary | ICD-10-CM | POA: Diagnosis not present

## 2018-06-20 DIAGNOSIS — Z89512 Acquired absence of left leg below knee: Secondary | ICD-10-CM | POA: Diagnosis not present

## 2018-06-20 DIAGNOSIS — M069 Rheumatoid arthritis, unspecified: Secondary | ICD-10-CM | POA: Diagnosis not present

## 2018-06-20 MED ORDER — DEXTROSE 5 % IV SOLN
2.0000 g | Freq: Once | INTRAVENOUS | Status: DC
  Filled 2018-06-20: qty 20

## 2018-06-21 ENCOUNTER — Other Ambulatory Visit: Payer: Self-pay

## 2018-06-21 ENCOUNTER — Encounter
Admission: RE | Disposition: A | Discharge status: Home / Self Care | Payer: Self-pay | Source: Ambulatory Visit | Attending: Vascular Surgery

## 2018-06-21 ENCOUNTER — Observation Stay
Admission: RE | Admit: 2018-06-21 | Discharge: 2018-06-22 | Disposition: A | Discharge status: Home / Self Care | Payer: Medicare HMO | Source: Ambulatory Visit | Attending: Vascular Surgery | Admitting: Vascular Surgery

## 2018-06-21 ENCOUNTER — Encounter: Payer: Self-pay | Admitting: *Deleted

## 2018-06-21 DIAGNOSIS — K219 Gastro-esophageal reflux disease without esophagitis: Secondary | ICD-10-CM | POA: Diagnosis not present

## 2018-06-21 DIAGNOSIS — M069 Rheumatoid arthritis, unspecified: Secondary | ICD-10-CM | POA: Insufficient documentation

## 2018-06-21 DIAGNOSIS — I998 Other disorder of circulatory system: Secondary | ICD-10-CM | POA: Diagnosis not present

## 2018-06-21 DIAGNOSIS — Z9049 Acquired absence of other specified parts of digestive tract: Secondary | ICD-10-CM | POA: Insufficient documentation

## 2018-06-21 DIAGNOSIS — E7800 Pure hypercholesterolemia, unspecified: Secondary | ICD-10-CM | POA: Diagnosis not present

## 2018-06-21 DIAGNOSIS — Z888 Allergy status to other drugs, medicaments and biological substances status: Secondary | ICD-10-CM | POA: Diagnosis not present

## 2018-06-21 DIAGNOSIS — M1990 Unspecified osteoarthritis, unspecified site: Secondary | ICD-10-CM | POA: Diagnosis not present

## 2018-06-21 DIAGNOSIS — Z955 Presence of coronary angioplasty implant and graft: Secondary | ICD-10-CM | POA: Insufficient documentation

## 2018-06-21 DIAGNOSIS — I252 Old myocardial infarction: Secondary | ICD-10-CM | POA: Insufficient documentation

## 2018-06-21 DIAGNOSIS — I10 Essential (primary) hypertension: Secondary | ICD-10-CM | POA: Insufficient documentation

## 2018-06-21 DIAGNOSIS — I70229 Atherosclerosis of native arteries of extremities with rest pain, unspecified extremity: Secondary | ICD-10-CM | POA: Diagnosis present

## 2018-06-21 DIAGNOSIS — Z90710 Acquired absence of both cervix and uterus: Secondary | ICD-10-CM | POA: Diagnosis not present

## 2018-06-21 DIAGNOSIS — I70221 Atherosclerosis of native arteries of extremities with rest pain, right leg: Principal | ICD-10-CM | POA: Insufficient documentation

## 2018-06-21 DIAGNOSIS — J449 Chronic obstructive pulmonary disease, unspecified: Secondary | ICD-10-CM | POA: Diagnosis not present

## 2018-06-21 DIAGNOSIS — Z8249 Family history of ischemic heart disease and other diseases of the circulatory system: Secondary | ICD-10-CM | POA: Insufficient documentation

## 2018-06-21 DIAGNOSIS — Z89512 Acquired absence of left leg below knee: Secondary | ICD-10-CM | POA: Diagnosis not present

## 2018-06-21 DIAGNOSIS — K589 Irritable bowel syndrome without diarrhea: Secondary | ICD-10-CM | POA: Diagnosis not present

## 2018-06-21 DIAGNOSIS — Z881 Allergy status to other antibiotic agents status: Secondary | ICD-10-CM | POA: Insufficient documentation

## 2018-06-21 DIAGNOSIS — Z882 Allergy status to sulfonamides status: Secondary | ICD-10-CM | POA: Insufficient documentation

## 2018-06-21 DIAGNOSIS — I70219 Atherosclerosis of native arteries of extremities with intermittent claudication, unspecified extremity: Secondary | ICD-10-CM

## 2018-06-21 HISTORY — PX: LOWER EXTREMITY ANGIOGRAPHY: CATH118251

## 2018-06-21 LAB — BUN: BUN: 22 mg/dL (ref 8–23)

## 2018-06-21 LAB — CREATININE, SERUM
Creatinine, Ser: 0.58 mg/dL (ref 0.44–1.00)
GFR calc Af Amer: >60 mL/min (ref >60)
GFR calc non Af Amer: >60 mL/min (ref >60)

## 2018-06-21 SURGERY — LOWER EXTREMITY ANGIOGRAPHY
Anesthesia: Moderate Sedation | Laterality: Right

## 2018-06-21 MED ORDER — CLOPIDOGREL BISULFATE 75 MG PO TABS
75.0000 mg | ORAL_TABLET | Freq: Every day | ORAL | Status: DC
Start: 2018-06-22 — End: 2018-06-22
  Administered 2018-06-22: 75 mg via ORAL
  Filled 2018-06-21: qty 1

## 2018-06-21 MED ORDER — SODIUM CHLORIDE 0.9 % IV SOLN
INTRAVENOUS | Status: DC
  Administered 2018-06-21: 12:00:00 via INTRAVENOUS

## 2018-06-21 MED ORDER — ACETAMINOPHEN 325 MG PO TABS: 650.0000 mg | ORAL_TABLET | ORAL | Status: DC | PRN

## 2018-06-21 MED ORDER — HEPARIN SODIUM (PORCINE) 1000 UNIT/ML IJ SOLN
INTRAMUSCULAR | Status: AC
  Filled 2018-06-21: qty 1

## 2018-06-21 MED ORDER — PREDNISONE 5 MG PO TABS
5.0000 mg | ORAL_TABLET | Freq: Every day | ORAL | Status: DC
  Administered 2018-06-22: 5 mg via ORAL
  Filled 2018-06-21: qty 1

## 2018-06-21 MED ORDER — ATORVASTATIN CALCIUM 10 MG PO TABS: 10.0000 mg | ORAL_TABLET | Freq: Every day | ORAL | Status: DC

## 2018-06-21 MED ORDER — TIROFIBAN (AGGRASTAT) BOLUS VIA INFUSION
25.0000 ug/kg | Freq: Once | INTRAVENOUS | Status: AC
  Administered 2018-06-21: 1247.5 ug via INTRAVENOUS
  Filled 2018-06-21: qty 25

## 2018-06-21 MED ORDER — MIDAZOLAM HCL 2 MG/2ML IJ SOLN
INTRAMUSCULAR | Status: DC | PRN
  Administered 2018-06-21 (×2): 1 mg via INTRAVENOUS
  Administered 2018-06-21: 2 mg via INTRAVENOUS
  Administered 2018-06-21 (×2): 1 mg via INTRAVENOUS

## 2018-06-21 MED ORDER — SODIUM CHLORIDE 0.9 % IV SOLN: INTRAVENOUS | Status: DC

## 2018-06-21 MED ORDER — FENTANYL CITRATE (PF) 100 MCG/2ML IJ SOLN
INTRAMUSCULAR | Status: AC
  Filled 2018-06-21: qty 2

## 2018-06-21 MED ORDER — CALCIUM CARBONATE-VITAMIN D 500-200 MG-UNIT PO TABS
1.0000 | ORAL_TABLET | Freq: Two times a day (BID) | ORAL | Status: DC
  Administered 2018-06-21 – 2018-06-22 (×2): 1 via ORAL
  Filled 2018-06-21 (×2): qty 1

## 2018-06-21 MED ORDER — ASPIRIN EC 81 MG PO TBEC
81.0000 mg | DELAYED_RELEASE_TABLET | Freq: Every day | ORAL | Status: DC
  Administered 2018-06-22: 81 mg via ORAL
  Filled 2018-06-21: qty 1

## 2018-06-21 MED ORDER — ONDANSETRON HCL 4 MG/2ML IJ SOLN: 4.0000 mg | Freq: Four times a day (QID) | INTRAMUSCULAR | Status: DC | PRN

## 2018-06-21 MED ORDER — NITROGLYCERIN 1 MG/10 ML FOR IR/CATH LAB
INTRA_ARTERIAL | Status: DC | PRN
  Administered 2018-06-21 (×3): 500 ug via INTRA_ARTERIAL

## 2018-06-21 MED ORDER — SERTRALINE HCL 100 MG PO TABS
100.0000 mg | ORAL_TABLET | Freq: Every day | ORAL | Status: DC
  Administered 2018-06-21 – 2018-06-22 (×2): 100 mg via ORAL
  Filled 2018-06-21 (×2): qty 1
  Filled 2018-06-21: qty 2

## 2018-06-21 MED ORDER — HYDROMORPHONE HCL 1 MG/ML IJ SOLN
INTRAMUSCULAR | Status: AC
  Filled 2018-06-21: qty 0.5

## 2018-06-21 MED ORDER — PANTOPRAZOLE SODIUM 40 MG PO TBEC
40.0000 mg | DELAYED_RELEASE_TABLET | Freq: Every day | ORAL | Status: DC
  Administered 2018-06-22: 40 mg via ORAL
  Filled 2018-06-21: qty 1

## 2018-06-21 MED ORDER — NITROGLYCERIN 5 MG/ML IV SOLN
INTRAVENOUS | Status: AC
  Filled 2018-06-21: qty 10

## 2018-06-21 MED ORDER — GEMFIBROZIL 600 MG PO TABS
600.0000 mg | ORAL_TABLET | Freq: Two times a day (BID) | ORAL | Status: DC
  Administered 2018-06-22: 600 mg via ORAL
  Filled 2018-06-21 (×3): qty 1

## 2018-06-21 MED ORDER — HYDROXYCHLOROQUINE SULFATE 200 MG PO TABS
200.0000 mg | ORAL_TABLET | Freq: Two times a day (BID) | ORAL | Status: DC
  Administered 2018-06-21 – 2018-06-22 (×2): 200 mg via ORAL
  Filled 2018-06-21 (×3): qty 1

## 2018-06-21 MED ORDER — TIROFIBAN HCL IV 12.5 MG/250 ML
INTRAVENOUS | Status: AC
  Filled 2018-06-21: qty 250

## 2018-06-21 MED ORDER — HYDROMORPHONE HCL 1 MG/ML IJ SOLN
1.0000 mg | Freq: Once | INTRAMUSCULAR | Status: DC | PRN
  Administered 2018-06-21: 0.5 mg via INTRAVENOUS

## 2018-06-21 MED ORDER — SODIUM CHLORIDE 0.9% FLUSH
3.0000 mL | Freq: Two times a day (BID) | INTRAVENOUS | Status: DC
  Administered 2018-06-22: 3 mL via INTRAVENOUS

## 2018-06-21 MED ORDER — HEPARIN (PORCINE) IN NACL 1000-0.9 UT/500ML-% IV SOLN
INTRAVENOUS | Status: AC
  Filled 2018-06-21: qty 1000

## 2018-06-21 MED ORDER — IOPAMIDOL (ISOVUE-300) INJECTION 61%
INTRAVENOUS | Status: DC | PRN
  Administered 2018-06-21: 100 mL via INTRAVENOUS

## 2018-06-21 MED ORDER — ADULT MULTIVITAMIN W/MINERALS CH
1.0000 | ORAL_TABLET | Freq: Every day | ORAL | Status: DC
  Administered 2018-06-22: 1 via ORAL
  Filled 2018-06-21: qty 1

## 2018-06-21 MED ORDER — PNEUMOCOCCAL VAC POLYVALENT 25 MCG/0.5ML IJ INJ
0.5000 mL | INJECTION | INTRAMUSCULAR | Status: DC
  Filled 2018-06-21: qty 0.5

## 2018-06-21 MED ORDER — SODIUM CHLORIDE 0.9 % IV SOLN: 250.0000 mL | INTRAVENOUS | Status: DC | PRN

## 2018-06-21 MED ORDER — SODIUM CHLORIDE 0.9% FLUSH: 3.0000 mL | INTRAVENOUS | Status: DC | PRN

## 2018-06-21 MED ORDER — TIROFIBAN HCL IN NACL 5-0.9 MG/100ML-% IV SOLN: 0.1500 ug/kg/min | INTRAVENOUS | Status: DC

## 2018-06-21 MED ORDER — SODIUM CHLORIDE (PF) 0.9 % IJ SOLN
INTRAMUSCULAR | Status: AC
  Filled 2018-06-21: qty 50

## 2018-06-21 MED ORDER — FERROUS SULFATE 325 (65 FE) MG PO TABS
325.0000 mg | ORAL_TABLET | Freq: Every day | ORAL | Status: DC
  Administered 2018-06-22: 325 mg via ORAL
  Filled 2018-06-21: qty 1

## 2018-06-21 MED ORDER — MIDAZOLAM HCL 2 MG/2ML IJ SOLN
INTRAMUSCULAR | Status: AC
  Filled 2018-06-21: qty 2

## 2018-06-21 MED ORDER — FENTANYL CITRATE (PF) 100 MCG/2ML IJ SOLN
INTRAMUSCULAR | Status: DC | PRN
  Administered 2018-06-21 (×5): 50 ug via INTRAVENOUS

## 2018-06-21 MED ORDER — MORPHINE SULFATE (PF) 4 MG/ML IV SOLN: 2.0000 mg | INTRAVENOUS | Status: DC | PRN

## 2018-06-21 MED ORDER — TIROFIBAN HCL IN NACL 5-0.9 MG/100ML-% IV SOLN
0.1500 ug/kg/min | INTRAVENOUS | Status: DC
  Filled 2018-06-21 (×3): qty 100

## 2018-06-21 MED ORDER — CEFAZOLIN SODIUM-DEXTROSE 2-4 GM/100ML-% IV SOLN
INTRAVENOUS | Status: AC
  Administered 2018-06-21: 13:00:00
  Filled 2018-06-21: qty 100

## 2018-06-21 MED ORDER — DILTIAZEM HCL ER COATED BEADS 240 MG PO CP24
240.0000 mg | ORAL_CAPSULE | Freq: Every day | ORAL | Status: DC
  Administered 2018-06-22: 240 mg via ORAL
  Filled 2018-06-21: qty 1
  Filled 2018-06-21: qty 2

## 2018-06-21 MED ORDER — LISINOPRIL 10 MG PO TABS
10.0000 mg | ORAL_TABLET | Freq: Every day | ORAL | Status: DC
  Filled 2018-06-21: qty 1

## 2018-06-21 MED ORDER — HEPARIN SODIUM (PORCINE) 1000 UNIT/ML IJ SOLN
INTRAMUSCULAR | Status: DC | PRN
  Administered 2018-06-21: 1000 [IU] via INTRAVENOUS
  Administered 2018-06-21: 2000 [IU] via INTRAVENOUS
  Administered 2018-06-21: 4000 [IU] via INTRAVENOUS

## 2018-06-21 MED ORDER — OCUVITE-LUTEIN PO CAPS
1.0000 | ORAL_CAPSULE | Freq: Two times a day (BID) | ORAL | Status: DC
  Administered 2018-06-21 – 2018-06-22 (×2): 1 via ORAL
  Filled 2018-06-21 (×3): qty 1

## 2018-06-21 MED ORDER — MIDAZOLAM HCL 5 MG/5ML IJ SOLN
INTRAMUSCULAR | Status: AC
  Filled 2018-06-21: qty 5

## 2018-06-21 MED ORDER — LIDOCAINE HCL (PF) 1 % IJ SOLN
INTRAMUSCULAR | Status: AC
  Filled 2018-06-21: qty 30

## 2018-06-21 MED ORDER — OXYCODONE HCL 5 MG PO TABS
5.0000 mg | ORAL_TABLET | ORAL | Status: DC | PRN
  Administered 2018-06-22: 10 mg via ORAL
  Filled 2018-06-21: qty 2

## 2018-06-21 MED ORDER — LEFLUNOMIDE 20 MG PO TABS
20.0000 mg | ORAL_TABLET | Freq: Every day | ORAL | Status: DC
  Administered 2018-06-22: 20 mg via ORAL
  Filled 2018-06-21: qty 1

## 2018-06-21 MED ORDER — TRAMADOL HCL 50 MG PO TABS: 50.0000 mg | ORAL_TABLET | Freq: Four times a day (QID) | ORAL | Status: DC | PRN

## 2018-06-21 MED ORDER — PRAVASTATIN SODIUM 40 MG PO TABS
80.0000 mg | ORAL_TABLET | Freq: Every day | ORAL | Status: DC
  Administered 2018-06-21: 80 mg via ORAL
  Filled 2018-06-21: qty 2

## 2018-06-21 SURGICAL SUPPLY — 52 items
BALLN LUTONIX 018 4X220X130 (BALLOONS) ×6
BALLN MINITREK RX 1.5X15 (BALLOONS) ×3
BALLN MINITREK RX 2.0X20 (BALLOONS) ×3
BALLN ULTRASCORE .014 2X40X150 (BALLOONS) ×3
BALLN ULTRASCORE 014 4X200X150 (BALLOONS) ×3
BALLN ULTRVRSE 2.5X40X150 (BALLOONS) ×6
BALLN ULTRVRSE 2X100X150 (BALLOONS) ×3
BALLN ULTRVRSE 2X300X150 (BALLOONS) ×3
BALLN ULTRVRSE 2X40X150 (BALLOONS) ×3
BALLN ULTRVRSE 3X100X130C (BALLOONS) ×3
BALLOON LUTONIX 018 4X220X130 (BALLOONS) ×2 IMPLANT
BALLOON MINITREK RX 1.5X15 (BALLOONS) ×1 IMPLANT
BALLOON MINITREK RX 2.0X20 (BALLOONS) ×1 IMPLANT
BALLOON ULTRSCRE .014 2X40X150 (BALLOONS) ×1 IMPLANT
BALLOON ULTRSCRE 014 4X200X150 (BALLOONS) ×1 IMPLANT
BALLOON ULTRVRSE 150X40X2.5 (BALLOONS) ×1 IMPLANT
BALLOON ULTRVRSE 2.5X40X150 (BALLOONS) ×1 IMPLANT
BALLOON ULTRVRSE 2X100X150 (BALLOONS) ×1 IMPLANT
BALLOON ULTRVRSE 2X300X150 (BALLOONS) ×1 IMPLANT
BALLOON ULTRVRSE 2X40X150 (BALLOONS) ×1 IMPLANT
BALLOON ULTRVRSE 3X100X130C (BALLOONS) ×1 IMPLANT
CANISTER PENUMBRA ENGINE (MISCELLANEOUS) ×3 IMPLANT
CATH CXI SUPP 2.6F 150 ST (CATHETERS) ×3 IMPLANT
CATH INDIGO CAT6 KIT (CATHETERS) ×3 IMPLANT
CATH PIG 70CM (CATHETERS) ×3 IMPLANT
CATH SEEKER .018X150 (CATHETERS) ×3 IMPLANT
CATH SEEKER 014X150 (CATHETERS) ×3 IMPLANT
CATH VERT 5FR 125CM (CATHETERS) ×3 IMPLANT
COVER PROBE U/S 5X48 (MISCELLANEOUS) ×3 IMPLANT
DEVICE PRESTO INFLATION (MISCELLANEOUS) ×3 IMPLANT
DEVICE SAFEGUARD 24CM (GAUZE/BANDAGES/DRESSINGS) ×3 IMPLANT
DEVICE STARCLOSE SE CLOSURE (Vascular Products) ×3 IMPLANT
DEVICE TORQUE .014-.018 (MISCELLANEOUS) ×1 IMPLANT
DEVICE TORQUE .025-.038 (MISCELLANEOUS) ×3 IMPLANT
GLIDEWIRE ADV .014X300CM (WIRE) ×3 IMPLANT
GLIDEWIRE ADV .035X260CM (WIRE) IMPLANT
GUIDEWIRE PFTE-COATED .018X300 (WIRE) ×3 IMPLANT
NEEDLE ENTRY 21GA 7CM ECHOTIP (NEEDLE) ×3 IMPLANT
PACK ANGIOGRAPHY (CUSTOM PROCEDURE TRAY) ×3 IMPLANT
SET INTRO CAPELLA COAXIAL (SET/KITS/TRAYS/PACK) ×3 IMPLANT
SHEATH BRITE TIP 5FRX11 (SHEATH) ×3 IMPLANT
SHEATH PINNACLE ST 6F 45CM (SHEATH) ×3 IMPLANT
SHEATH RAABE 6FR (SHEATH) ×3 IMPLANT
SHIELD RADPAD SCOOP 12X17 (MISCELLANEOUS) ×3 IMPLANT
TORQUE DEVICE .014-.018 (MISCELLANEOUS) ×3
TUBING CONTRAST HIGH PRESS 72 (TUBING) ×3 IMPLANT
WIRE AQUATRACK .035X260CM (WIRE) ×3 IMPLANT
WIRE ASAHI FIELDER XT 300CM (WIRE) ×3 IMPLANT
WIRE G V18X300CM (WIRE) ×3 IMPLANT
WIRE J 3MM .035X145CM (WIRE) ×3 IMPLANT
WIRE MAGIC TORQUE 315CM (WIRE) ×3 IMPLANT
WIRE SPARTACORE .014X300CM (WIRE) ×3 IMPLANT

## 2018-06-21 NOTE — H&P (Signed)
[facility] History & Physical Update  The patient was interviewed and re-examined.  The patient's previous History and Physical has been reviewed and is unchanged.  There is no change in the plan of care. We plan to proceed with the scheduled procedure.  [name], MD  06/21/2018, 2:00 PM

## 2018-06-21 NOTE — Op Note (Signed)
[facility] Percutaneous Study/Intervention Procedural Note   Date of Surgery: 06/21/2018  Surgeon: [name]  Pre-operative Diagnosis: Atherosclerotic occlusive disease bilateral lower extremities with ischemic changes to the right lower extremity.  Post-operative diagnosis: Same  Procedure(s) Performed: 1. Introduction catheter into right lower extremity 3rd order catheter placement with additional third order 2. Contrast injection right lower extremity for distal runoff  3. Percutaneous transluminal angioplasty right superficial femoral and popliteal arteries to 4 mm 4. Percutaneous transluminal angioplasty right anterior tibial artery at multiple locations extending into the dorsalis pedis             5.  Percutaneous transluminal angioplasty tibioperoneal trunk and proximal peroneal  6. Mechanical thrombectomy with the penumbra cat 6 distal popliteal and proximal tibioperoneal trunk             7.  Star close closure left common femoral arteriotomy  Anesthesia: Conscious sedation was administered under my direct supervision by the interventional radiology RN. IV Versed plus fentanyl were utilized. Continuous ECG, pulse oximetry and blood pressure was monitored throughout the entire procedure.  Conscious sedation was for a total of 1 hour 17 minutes.  Sheath: 6 [place] Raby later exchanged to a 6 [place] destination to allow for the mechanical thrombectomy  Contrast: 100 cc  Fluoroscopy Time: 35.6 minutes  Indications: Melissa [name] presents with increasing pain of the right lower extremity.  Right great toe demonstrates ischemic changes with cyanosis of the skin.  There is no skin breakdown at this time.  However, this suggests the patient is having limb threatening ischemia. The risks and benefits are reviewed all questions answered patient agrees to proceed.  Procedure:Melissa [name]  is a 77 y.o. y.o. female who was identified and appropriate procedural time out was performed. The patient was then placed supine on the table and prepped and draped in the usual sterile fashion.   Ultrasound was placed in the sterile sleeve and the left groin was evaluated the left common femoral artery was echolucent and pulsatile indicating patency. Image was recorded for the permanent record and under real-time visualization a microneedle was inserted into the common femoral artery followed by the microwire and then the micro-sheath. A J-wire was then advanced through the micro-sheath and a 5 [place] sheath was then inserted over a J-wire. J-wire was then advanced and a 5 French pigtail catheter was positioned at the level of T12.  AP projection of the aorta was then obtained. Pigtail catheter was repositioned to above the bifurcation and a LAO view of the pelvis was obtained. Subsequently a pigtail catheter with the stiff angle Glidewire was used to cross the aortic bifurcation the catheter wire were advanced down into the right distal external iliac artery. Oblique view of the femoral bifurcation was then obtained and subsequently the wire was reintroduced and the pigtail catheter negotiated into the SFA representing third order catheter placement. Distal runoff was then performed.  Interpretation: The abdominal aorta appears to be widely patent without evidence of hemodynamically significant disease.  The bilateral common and external iliac arteries are also patent without evidence of hemodynamically significant disease.  There is moderate to severe disease of the internal iliac arteries bilaterally.  The right common femoral is patent.  The profunda femoris is patent but severely diseased diffusely more so distally than proximally.  There are multiple greater than 70% stenoses throughout the course of the SFA which worsen as you course more distally.  In the mid popliteal there is  an 80% stenosis  right at the level of the tibial plateau.  Trifurcation is severely diseased with greater than 70% stenosis at the origin of the anterior tibial as well as at the origin the peroneal, the posterior tibial is diffusely diseased and appears to occlude near the ankle.  The anterior tibial is the dominant runoff to the foot but demonstrates multiple lesions throughout its length that are greater than 70% there is a subtotal occlusion at the level of the ankle and just beyond that there is a second subtotal occlusion.  There is very severe distal small vessel disease intrinsic to the foot.  Pedal arch is not intact and severely diseased.  Based on these findings it is elected to proceed with intervention for attempts at limb salvage.  5000 units of heparin was then given and allowed to circulate and a 6 [place] Raby sheath was advanced up and over the bifurcation and positioned in the femoral artery, later in the case in order to accommodate the penumbra catheter the sheath was exchanged for a 6 [place] destination.  Straight catheter and stiff angle Glidewire were then negotiated down into the distal popliteal. Catheter was then advanced. Hand injection contrast demonstrated the tibial anatomy in detail.  The wire is negotiated into the anterior tibial.    A 4 mm x 150 mm ultra score balloon was then advanced down to the mid popliteal and 2 separate inflations were performed pulling the balloon more proximally each inflation was to 12 atm for 1 minute.  Follow-up imaging demonstrated less than 10% residual stenosis.  A 4 mm x 22 cm Lutonix balloon was then used to treat the distal half of the SFA popliteal and a 4 mm x 22 cm Lutonix balloon was used to treat the proximal half.  Both inflations were again to 12 atm but this time for 2 minutes.  Follow-up imaging demonstrated preservation of flow there is less than 10% residual runoff and I then readjusted to the detector to address the anterior tibial.  A series  of balloons were subsequently utilized to treat the anterior tibial including a long 3 mm balloon throughout the proximal two thirds.  Distally crossing the subtotal occlusions with the wire was not particularly difficult but trying to find a catheter and/or balloon that would also cross was exceptionally difficult.  Ultimately I was able to get to the mid dorsalis pedis with a 1.5 x 15 mm coronary balloon multiple inflations were performed to 8 to 10 atm.  Subsequently a 2 mm x 40 mm ultra versed balloon was utilized.  Throughout this procedure multiple intra-arterial aliquots of nitroglycerin were also given.  At the conclusion the anterior tibial was patent with less than 15% residual stenosis however there was extremely sluggish flow likely due to the very poor distal outflow in the foot.  Hand-injection of contrast from the sheath demonstrated what appeared to be thrombus at the origin of the anterior tibial and tibioperoneal trunk.  I treated this with the penumbra CAT 6 device.  Several passes were performed with follow-up imaging demonstrated resolution of this lesion.  The wire was then negotiated into the peroneal.  As noted above the ostia of the peroneal demonstrated a 70 to 80% stenosis and there was a subtotal occlusion several centimeters distally.  Initially I was able to pass a 3 mm balloon across the ostial lesion this was inflated to 14 atm for 1 minute.  3 mm balloon would not cross the subtotal occlusion and  therefore a 2 mm x 40 mm ultra versus balloon was advanced across this lesion and inflated to 14 atm for 1 minute.  Follow-up imaging demonstrated less than 5% residual stenosis of both peroneal lesions.  There is now flow noted into the anterior tibial tibioperoneal trunk posterior tibial proximally and the peroneal  Given the sluggishness of the flow and the very poor distal outflow intrinsic to the foot I elected to initiate an Aggrastat drip and will maintain her on an Aggrastat  drip overnight.  After review of these images the sheath is pulled into the left external iliac oblique of the common femoral is obtained and a Star close device deployed. There no immediate Complications.  Findings:  The abdominal aorta appears to be widely patent without evidence of hemodynamically significant disease.  The bilateral common and external iliac arteries are also patent without evidence of hemodynamically significant disease.  There is moderate to severe disease of the internal iliac arteries bilaterally.  The right common femoral is patent.  The profunda femoris is patent but severely diseased diffusely more so distally than proximally.  There are multiple greater than 70% stenoses throughout the course of the SFA which worsen as you course more distally.  In the mid popliteal there is an 80% stenosis right at the level of the tibial plateau.  Trifurcation is severely diseased with greater than 70% stenosis at the origin of the anterior tibial as well as at the origin the peroneal, the posterior tibial is diffusely diseased and appears to occlude near the ankle.  The anterior tibial is the dominant runoff to the foot but demonstrates multiple lesions throughout its length that are greater than 70% there is a subtotal occlusion at the level of the ankle and just beyond that there is a second subtotal occlusion.  There is very severe distal small vessel disease intrinsic to the foot.  Pedal arch is not intact and severely diseased.  Following angioplasty SFA and popliteal now have in-line flow and looks quite nice with less than 5% residual stenosis. Angioplasty of the anterior tibial demonstrates patency with compromised distal outflow less than 15% residual stenosis.  Following thrombectomy there is resolution of this thrombotic material.  Following angioplasty of the peroneal there is less than 5% residual stenosis.   Summary: Successful recanalization right lower extremity for limb  salvage   Disposition: Patient was taken to the recovery room in stable condition having tolerated the procedure well.  [name] 06/21/2018,5:19 PM

## 2018-06-22 DIAGNOSIS — K589 Irritable bowel syndrome without diarrhea: Secondary | ICD-10-CM | POA: Diagnosis not present

## 2018-06-22 DIAGNOSIS — I10 Essential (primary) hypertension: Secondary | ICD-10-CM | POA: Diagnosis not present

## 2018-06-22 DIAGNOSIS — J449 Chronic obstructive pulmonary disease, unspecified: Secondary | ICD-10-CM | POA: Diagnosis not present

## 2018-06-22 DIAGNOSIS — I998 Other disorder of circulatory system: Secondary | ICD-10-CM | POA: Diagnosis not present

## 2018-06-22 DIAGNOSIS — E7800 Pure hypercholesterolemia, unspecified: Secondary | ICD-10-CM | POA: Diagnosis not present

## 2018-06-22 DIAGNOSIS — I70221 Atherosclerosis of native arteries of extremities with rest pain, right leg: Secondary | ICD-10-CM | POA: Diagnosis not present

## 2018-06-22 DIAGNOSIS — M069 Rheumatoid arthritis, unspecified: Secondary | ICD-10-CM | POA: Diagnosis not present

## 2018-06-22 DIAGNOSIS — K219 Gastro-esophageal reflux disease without esophagitis: Secondary | ICD-10-CM | POA: Diagnosis not present

## 2018-06-22 DIAGNOSIS — I252 Old myocardial infarction: Secondary | ICD-10-CM | POA: Diagnosis not present

## 2018-06-22 MED ORDER — MUPIROCIN 2 % EX OINT: TOPICAL_OINTMENT | CUTANEOUS | 0 refills | Status: AC

## 2018-06-22 NOTE — Progress Notes (Signed)
Subjective: Interval History: has complaints mild pain in the right first toe.. No discomfort at the access site.  Mild calf pain.  Wants to go home  Objective: Vital signs in last 24 hours: Temp:  [97.7 F (36.5 C)-98.3 F (36.8 C)] 98.3 F (36.8 C) (12/07 0344) Pulse Rate:  [47-91] 57 (12/07 0345) Resp:  [9-24] 14 (12/06 1826) BP: (98-126)/(40-65) 122/51 (12/07 0345) SpO2:  [89 %-100 %] 97 % (12/07 0344) Weight:  [49.9 kg-52.3 kg] 52.3 kg (12/06 1848)  Intake/Output from previous day: 12/06 0701 - 12/07 0700 In: -  Out: 1500 [Urine:1500] Intake/Output this shift: Total I/O In: -  Out: 300 [Urine:300]  General appearance: alert, cooperative and no distress Extremities: no edema, redness or tenderness in the calves or thighs and Toe ulcer without significant change Pulses: Right Pulses: FEM: present 1+, POP: present 1+, DP: Not palpable, PT: Not palpable Left Pulses: FEM: present 1+ and External balloon in place for focal pressure Incision/Wound: Toe wrapped without significant drainage on wound dressing  Lab Results: No results for input(s): WBC, HGB, HCT, PLT in the last 72 hours. 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 New Right great toe soreness.  Performing Technologist: Concha [place] RVT  Examination Guidelines: A complete evaluation includes at minimum, Doppler waveform signals and systolic blood pressure reading at the level of bilateral brachial, anterior tibial, and posterior tibial arteries, when vessel segments are accessible. Bilateral testing is considered an integral part of a complete examination. Photoelectric Plethysmograph (PPG) waveforms and toe systolic pressure readings are included as required and additional duplex testing as needed.  Limited examinations for reoccurring indications may be performed as noted.  ABI Findings: +---------+------------------+-----+----------+--------+ Right    Rt Pressure (mmHg)IndexWaveform  Comment  +---------+------------------+-----+----------+--------+ Brachial 147                                       +---------+------------------+-----+----------+--------+ CFA                             triphasic          +---------+------------------+-----+----------+--------+ Popliteal                       triphasic          +---------+------------------+-----+----------+--------+ ATA                             monophasicNC       +---------+------------------+-----+----------+--------+ PTA                             monophasicNC       +---------+------------------+-----+----------+--------+ Great Toe32                0.22 Abnormal           +---------+------------------+-----+----------+--------+ +-------+-----------+-----------+------------+------------+ ABI/TBIToday's ABIToday's TBIPrevious ABIPrevious TBI +-------+-----------+-----------+------------+------------+ Right  NonComp    .32                                 +-------+-----------+-----------+------------+------------+  TOES Findings: +----------+---------------+--------+-------+ Right ToesPressure (mmHg)WaveformComment +----------+---------------+--------+-------+ 1st Digit                Abnormal        +----------+---------------+--------+-------+ 2nd Digit                Abnormal        +----------+---------------+--------+-------+ 3rd Digit                Abnormal        +----------+---------------+--------+-------+ 4th Digit                Abnormal        +----------+---------------+--------+-------+ 5th Digit                Abnormal        +----------+---------------+--------+-------+  Left BKA.  Summary: Right: Resting right ankle-brachial index indicates noncompressible  right lower extremity arteries.The right toe-brachial index is abnormal. Added duplex imaging to further evaluate flow. Moderate atherosclerosis throughout. Patent vessels with no significant stenosis throughout.  *See table(s) above for measurements and observations.  Electronically signed by [name] MD on 06/17/2018 at 4:51:54 PM.    Final    Anti-infectives: Anti-infectives (From admission, onward)   Start     Dose/Rate Route Frequency Ordered Stop   06/21/18 2200  hydroxychloroquine (PLAQUENIL) tablet 200 mg     200 mg Oral 2 times daily 06/21/18 1718     06/21/18 1227  ceFAZolin (ANCEF) 2-4 GM/100ML-% IVPB    Note to Pharmacy:  Despina Arias  : cabinet override      06/21/18 1227 06/21/18 1300   06/20/18 2330  ceFAZolin (ANCEF) 2 g in dextrose 5 % 50 mL IVPB  Status:  Discontinued     2 g 100 mL/hr over 30 Minutes Intravenous  Once 06/20/18 2327 06/21/18 1718      Assessment/Plan: s/p Procedure(s): LOWER EXTREMITY ANGIOGRAPHY (Right) Continue ABX therapy due to ongoing open wound.  Aggrastat discontinued at 7 AM this morning.  Resume home regimen of medication including Eliquis, Plavix, aspirin.  She is to follow-up Thursday with Dr. Delana Meyer.  All questions were answered.  She says she has pain medication already at home.  Family is available at home for assistance with mobilization.   LOS: 0 days   Calvin Jablonowski A Yong Wahlquist 06/22/2018, 9:44 AM

## 2018-06-22 NOTE — Discharge Instructions (Signed)
Femoral Site Care Refer to this sheet in the next few weeks. These instructions provide you with information about caring for yourself after your procedure. Your health care provider may also give you more specific instructions. Your treatment has been planned according to current medical practices, but problems sometimes occur. Call your health care provider if you have any problems or questions after your procedure. What can I expect after the procedure? After your procedure, it is typical to have the following:  Bruising at the site that usually fades within 1-2 weeks.  Blood collecting in the tissue (hematoma) that may be painful to the touch. It should usually decrease in size and tenderness within 1-2 weeks.  Follow these instructions at home:   Take medicines only as directed by your health care provider.  If you take Metformin, hold for 48hours after your procedure.  The x-ray dye causes you to pass a considerate amount of urine.  For this reason, you will be asked to drink plenty of liquids after the procedure to prevent dehydration.  You may resume you regular diet.  Avoid caffeine products.    You may shower 24 hours after procedure. Leave the bandage on your access site for.  You may wash around your dressing but do not rub the site, this may cause bleeding.  Pat the area dry with a clean towel. After 48hours remove bandage and leave open to air.  Do not take baths, swim, or use a hot tub for 7 days.  Check your insertion site every day for redness, swelling, or drainage.  If you lose feeling or develop tingling or pain in your leg or foot after the procedure, please walk around first.  If the discomfort does not improve , contact your physician and proceed to the nearest emergency room.  Loss of feeling in your leg might mean that a blockage has formed in the artery and this can be appropriately treated.  Limit your activity for the next two days after your procedure.  Avoid  stooping, bending, heavy lifting or exertion as this may put pressure on the insertion site.  Resume normal activities in 48 hours.   check the insertion site occasionally.  If any oozing occurs or there is apparent swelling, firm pressure over the site will prevent a bruise from forming.  You can not hurt anything by pressing directly on the site.  The pressure stops the bleeding by allowing a small clot to form.  If the bleeding continues after the pressure has been applied for more than 15 minutes, call 911 or go to the nearest emergency room.     Apply pressure to access site if you have to laugh, cough, or sneeze.  Do not apply powder or lotion to the site.  Limit use of stairs to twice a day for the first 2-3 days or as directed by your health care provider.  Do not squat for the first 2-3 days or as directed by your health care provider.  Do not lift over 10 lb (4.5 kg) for 5 days after your procedure or as directed by your health care provider.  Ask your health care provider when it is okay to: ? Return to work or school. ? Resume usual physical activities or sports. ? Resume sexual activity.  Do not drive home if you are discharged the same day as the procedure. Have someone else drive you.  You may drive 48 hours after the procedure unless otherwise instructed by your health care  provider.  Do not operate machinery or power tools for 24 hours after the procedure or as directed by your health care provider.  If your procedure was done as an outpatient procedure, which means that you went home the same day as your procedure, a responsible adult should be with you for the first 24 hours after you arrive home.  Keep all follow-up visits as directed by your health care provider. This is important. Contact a health care provider if:  You have a fever.  You have chills.  You have increased bleeding from the site. Hold pressure on the site. Get help right away if:  You have  unusual pain at the site.  You have redness, warmth, or swelling at the site.  You have drainage (other than a small amount of blood on the dressing) from the site.  The site is bleeding, and the bleeding does not stop after 30 minutes of holding steady pressure on the site.  Your leg or foot becomes pale, cool, tingly, or numb. This information is not intended to replace advice given to you by your health care provider. Make sure you discuss any questions you have with your health care provider. Document Released: 03/06/2014 Document Revised: 12/09/2015 Document Reviewed: 01/20/2014 Elsevier Interactive Patient Education  [employer].

## 2018-06-22 NOTE — Discharge Summary (Signed)
Physician Discharge Summary  Patient ID: Melissa [name] MRN: [id number] DOB/AGE: 1941-03-06 77 y.o.  Admit date: 06/21/2018 Discharge date: 06/22/2018  Admission Diagnoses:  Discharge Diagnoses:  Active Problems:   Atherosclerosis of native arteries of extremity with rest pain [facility])   Discharged Condition: good  Hospital Course: This is a lady was brought to the hospital as an outpatient and underwent angiogram and intervention on 21 June 2018.  She has a very complicated history.  She has had intervention performed previously.  She has a contralateral below-knee amputation.  In summary, she had angiogram of the right lower extremity performed via left femoral access with 4 mm balloon angioplasty of the right superficial femoral and popliteal segments, 3 mm balloon angioplasty of the right anterior tibial in multiple locations in the tibial peroneal trunk and proximal peroneal artery as well.  Penumbra mechanical thrombectomy was required.  This was in the distal popliteal and proximal tibial peroneal trunk.  There was sluggish flow at the conclusion of the case but markedly improved patency.  Aggrastat was initiated in addition to her usual medications at home including Eliquis, Plavix, aspirin.*Closure device was utilized as well as external compression balloon.  Aggrastat was stopped at 7 AM, she has mild pain in the toe at this point, no complication from her access site.  The plan will be to continue her outpatient Eliquis, Plavix, aspirin, and other meds as before.  Prescription for Bactroban has been given.  She will follow-up with Dr. 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  Post-operative diagnosis: Same  Procedure(s) Performed: 1. Introduction catheter into right lower extremity 3rd order catheter placement with additional third order 2. Contrast injection right lower extremity for distal runoff  3. Percutaneous transluminal angioplasty right superficial femoral and popliteal arteries to 4 mm 4. Percutaneous transluminal angioplasty right anterior tibial artery at multiple locations extending into the dorsalis pedis             5.  Percutaneous transluminal angioplasty tibioperoneal trunk and proximal peroneal  6. Mechanical thrombectomy with the penumbra cat 6 distal popliteal and proximal tibioperoneal trunk             7.  Star close closure left common femoral arteriotomy  Anesthesia: Conscious sedation was administered under my direct supervision by the interventional radiology RN. IV Versed plus fentanyl were utilized. Continuous ECG, pulse oximetry and blood pressure was monitored throughout the entire procedure.  Conscious sedation was for a total of 1 hour 17 minutes.  Sheath: 6 [place] Raby later exchanged to a 6 [place] destination to allow for the mechanical thrombectomy  Contrast: 100 cc  Fluoroscopy Time: 35.6 minutes  Indications: REKIA KUJALA presents with increasing pain of the right lower extremity.  Right great toe demonstrates ischemic changes with cyanosis of the skin.  There is no skin breakdown at this time.  However, this suggests the patient is having limb threatening ischemia. The risks and benefits are reviewed all questions answered patient agrees to proceed.  Procedure:[name] is a 77 y.o. y.o. female who was identified and appropriate procedural time out was performed. The patient was then placed supine on the table and prepped and draped in the usual sterile fashion.  Ultrasound was placed in the sterile sleeve and the left groin was evaluated the left common femoral artery was echolucent and pulsatile indicating patency. Image was recorded for the permanent record and under real-time visualization a microneedle was inserted into the common femoral artery followed by the microwire and then the micro-sheath. A J-wire was then advanced through the micro-sheath and a 5 [place] sheath was then inserted over a J-wire. J-wire was then advanced and a 5 French pigtail catheter was positioned at the level of T12.  AP projection of the aorta was then obtained. Pigtail catheter was repositioned to above the bifurcation and a LAO view of the pelvis was obtained. Subsequently a pigtail catheter with the stiff angle Glidewire was used to cross the aortic bifurcation the catheter wire were advanced down into the right distal external iliac artery. Oblique view of the femoral bifurcation was then obtained and subsequently the wire was reintroduced and the pigtail catheter negotiated into the SFA representing third order catheter placement. Distal runoff was then performed.  Interpretation: The abdominal aorta appears to be widely patent without evidence of hemodynamically significant disease.  The bilateral common and external iliac arteries are also patent without evidence of hemodynamically significant disease.  There is moderate to severe disease of the internal iliac arteries bilaterally.  The right common femoral is patent.  The profunda femoris is patent but severely diseased diffusely more so distally than proximally.  There are multiple greater than 70% stenoses throughout the course of the SFA which worsen as you course more distally.  In the mid popliteal there is an 80% stenosis right at the level of the tibial plateau.  Trifurcation is severely diseased with greater than 70% stenosis at the origin of the anterior tibial as well as at the origin the peroneal, the  posterior tibial is diffusely diseased and appears to occlude near the ankle.  The anterior tibial is the dominant runoff to the foot but demonstrates multiple lesions throughout its length that are greater than 70% there is a subtotal occlusion at the level of the ankle and just beyond that there is a second subtotal occlusion.  There is very severe distal small vessel disease intrinsic to the foot.  Pedal arch is not intact and severely diseased.  Based on these findings it is elected to proceed with intervention for attempts at limb salvage.  5000 units of heparin was then given and allowed to circulate and a 6 [place] Raby sheath was advanced up and over the bifurcation and positioned in the femoral artery, later in the case in order to accommodate the penumbra catheter the sheath was exchanged for a 6 [place] destination.  Straight catheter and stiff angle Glidewire were then negotiated down into the distal popliteal. Catheter was then advanced. Hand injection contrast demonstrated the tibial anatomy in detail.  The wire is negotiated into the anterior tibial.    A 4 mm x 150 mm ultra score balloon was then advanced down to the mid popliteal and 2 separate inflations were performed pulling the balloon more proximally each inflation was to 12 atm for 1 minute.  Follow-up imaging demonstrated less than 10% residual stenosis.  A 4 mm x 22 cm Lutonix balloon was then used to treat the distal half of the SFA popliteal and a 4 mm x 22 cm Lutonix balloon was used to treat the proximal half.  Both inflations were again to 12 atm but this time for 2 minutes.  Follow-up imaging demonstrated preservation of flow  there is less than 10% residual runoff and I then readjusted to the detector to address the anterior tibial.  A series of balloons were subsequently utilized to treat the anterior tibial including a long 3 mm balloon throughout the proximal two thirds.  Distally crossing the subtotal occlusions with  the wire was not particularly difficult but trying to find a catheter and/or balloon that would also cross was exceptionally difficult.  Ultimately I was able to get to the mid dorsalis pedis with a 1.5 x 15 mm coronary balloon multiple inflations were performed to 8 to 10 atm.  Subsequently a 2 mm x 40 mm ultra versed balloon was utilized.  Throughout this procedure multiple intra-arterial aliquots of nitroglycerin were also given.  At the conclusion the anterior tibial was patent with less than 15% residual stenosis however there was extremely sluggish flow likely due to the very poor distal outflow in the foot.  Hand-injection of contrast from the sheath demonstrated what appeared to be thrombus at the origin of the anterior tibial and tibioperoneal trunk.  I treated this with the penumbra CAT 6 device.  Several passes were performed with follow-up imaging demonstrated resolution of this lesion.  The wire was then negotiated into the peroneal.  As noted above the ostia of the peroneal demonstrated a 70 to 80% stenosis and there was a subtotal occlusion several centimeters distally.  Initially I was able to pass a 3 mm balloon across the ostial lesion this was inflated to 14 atm for 1 minute.  3 mm balloon would not cross the subtotal occlusion and therefore a 2 mm x 40 mm ultra versus balloon was advanced across this lesion and inflated to 14 atm for 1 minute.  Follow-up imaging demonstrated less than 5% residual stenosis of both peroneal lesions.  There is now flow noted into the anterior tibial tibioperoneal trunk posterior tibial proximally and the peroneal  Given the sluggishness of the flow and the very poor distal outflow intrinsic to the foot I elected to initiate an Aggrastat drip and will maintain her on an Aggrastat drip overnight.  After review of these images the sheath is pulled into the left external iliac oblique of the common femoral is obtained and a Star close device deployed. There  no immediate Complications.  Findings:  The abdominal aorta appears to be widely patent without evidence of hemodynamically significant disease.  The bilateral common and external iliac arteries are also patent without evidence of hemodynamically significant disease.  There is moderate to severe disease of the internal iliac arteries bilaterally.  The right common femoral is patent.  The profunda femoris is patent but severely diseased diffusely more so distally than proximally.  There are multiple greater than 70% stenoses throughout the course of the SFA which worsen as you course more distally.  In the mid popliteal there is an 80% stenosis right at the level of the tibial plateau.  Trifurcation is severely diseased with greater than 70% stenosis at the origin of the anterior tibial as well as at the origin the peroneal, the posterior tibial is diffusely diseased and appears to occlude near the ankle.  The anterior tibial is the dominant runoff to the foot but demonstrates multiple lesions throughout its length that are greater than 70% there is a subtotal occlusion at the level of the ankle and just beyond that there is a second subtotal occlusion.  There is very severe distal small vessel disease intrinsic to the foot.  Pedal arch is not  intact and severely diseased.  Following angioplasty SFA and popliteal now have in-line flow and looks quite nice with less than 5% residual stenosis. Angioplasty of the anterior tibial demonstrates patency with compromised distal outflow less than 15% residual stenosis.  Following thrombectomy there is resolution of this thrombotic material.  Following angioplasty of the peroneal there is less than 5% residual stenosis.   Summary: Successful recanalization right lower extremity for limb salvage   Disposition: Patient was taken to the recovery room in stable condition having tolerated the procedure well.  [name] 06/21/2018,5:19 PM        Electronically signed by Katha Cabal, MD at 06/21/2018 5:40 PM  Treatments: IV hydration, anticoagulation: ASA, Plavix and Aggrastat, Eliquis and procedures: Angiogram, see above  Discharge Exam: Blood pressure 113/67, pulse 68, temperature 98.3 F (36.8 C), temperature source Oral, resp. rate 19, height _0  (1.626 m), weight 52.3 kg, SpO2 96 %. General appearance: no distress.  No insertion site complications.  Right foot without significant worse pain.  Ulcer dressing present without change.  Disposition: Discharge disposition: 01-Home or Self Care       Discharge Instructions    Call MD for:  redness, tenderness, or signs of infection (pain, swelling, bleeding, redness, odor or green/yellow discharge around incision site)   Complete by:  As directed    Call MD for:  severe or increased pain, loss or decreased feeling  in affected limb(s)   Complete by:  As directed    Call MD for:  temperature >100.5   Complete by:  As directed    Discharge instructions   Complete by:  As directed    Change dressing daily to toe.  Resume medications as before.  Follow-up appointment with Dr. Delana Meyer on Thursday, December 12.  Call sooner with any concerns or questions. Use Bactroban twice daily on wound, covering with gauze.  May wash with soap and water when cleaning   No dressing needed   Complete by:  As directed    Replace only if drainage present   Resume previous diet   Complete by:  As directed      Allergies as of 06/22/2018      Reactions   Lodine [etodolac] Hives, Itching   Methotrexate Derivatives Other (See Comments)   Flu like symptoms   Nitrofuran Derivatives Nausea And Vomiting, Other (See Comments)   Made sicker    Levaquin [levofloxacin] Itching, Rash, Other (See Comments)   Muscle pain   Norvasc [amlodipine] Other (See Comments)   Unknown    Pentasa [mesalamine Er] Other (See Comments)   Unknown    Sulfa Antibiotics  Itching, Rash   Vioxx [rofecoxib] Other (See Comments)   Unknown       Medication List    TAKE these medications   acetaminophen 500 MG tablet Commonly known as:  TYLENOL Take 1,000 mg by mouth every 8 (eight) hours as needed for mild pain or headache.   apixaban 5 MG Tabs tablet Commonly known as:  ELIQUIS Take 1 tablet (5 mg total) by mouth 2 (two) times daily.   aspirin EC 81 MG tablet Take 1 tablet (81 mg total) by mouth daily.   budesonide 3 MG 24 hr capsule Commonly known as:  ENTOCORT EC Take 3 capsules (9 mg total) by mouth daily. Taper down to 3 mg What changed:    how much to take  additional instructions   CALCIUM 600+D 600-400 MG-UNIT tablet Generic drug:  Calcium Carbonate-Vitamin D  Take 1 tablet by mouth 2 (two) times daily.   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   diltiazem 240 MG 24 hr capsule Commonly known as:  CARDIZEM CD Take 240 mg by mouth daily.   NUTRITIONAL SHAKE HIGH PROTEIN Liqd Take 237 mLs by mouth daily. Slim Fast protein   feeding supplement (ENSURE ENLIVE) Liqd Take 237 mLs by mouth 2 (two) times daily between meals.   ferrous sulfate 325 (65 FE) MG tablet Take 325 mg by mouth daily with breakfast.   gabapentin 300 MG capsule Commonly known as:  NEURONTIN TAKE ONE CAPSULE BY MOUTH EVERY DAY. USE THIS MEDICATION FIRST   gemfibrozil 600 MG tablet Commonly known as:  LOPID Take 600 mg by mouth 2 (two) times daily before a meal.   HYDROcodone-acetaminophen 5-325 MG tablet Commonly known as:  NORCO/VICODIN Take 1-2 tablets by mouth every 6 (six) hours as needed for moderate pain or severe pain.   hydroxychloroquine 200 MG tablet Commonly known as:  PLAQUENIL Take 200 mg by mouth 2 (two) times daily.   leflunomide 20 MG tablet Commonly known  as:  ARAVA Take 20 mg by mouth daily.   lisinopril 10 MG tablet Commonly known as:  PRINIVIL,ZESTRIL Take 10 mg by mouth daily.   LORazepam 1 MG tablet Commonly known as:  ATIVAN Take 1 tablet (1 mg total) by mouth 2 (two) times daily. What changed:    when to take this  reasons to take this   multivitamin with minerals Tabs tablet Take 1 tablet by mouth daily.   mupirocin ointment 2 % Commonly known as:  BACTROBAN Apply to affected area 2 times daily   omeprazole 20 MG capsule Commonly known as:  PRILOSEC Take 20 mg by mouth 2 (two) times daily before a meal.   pravastatin 80 MG tablet Commonly known as:  PRAVACHOL Take 80 mg by mouth at bedtime.   predniSONE 5 MG tablet Commonly known as:  DELTASONE Take 5 mg by mouth daily with breakfast.   PRESERVISION AREDS Caps Take 1 capsule by mouth 2 (two) times daily.   sertraline 100 MG tablet Commonly known as:  ZOLOFT Take 100 mg by mouth daily.   traMADol 50 MG tablet Commonly known as:  ULTRAM Take 1 tablet (50 mg total) by mouth every 6 (six) hours as needed for moderate pain.        SignedShelda Altes 06/22/2018, 9:58 AM

## 2018-06-24 ENCOUNTER — Encounter: Payer: Self-pay | Admitting: Vascular Surgery

## 2018-06-24 ENCOUNTER — Ambulatory Visit (INDEPENDENT_AMBULATORY_CARE_PROVIDER_SITE_OTHER): Payer: Medicare HMO | Admitting: Vascular Surgery

## 2018-06-24 ENCOUNTER — Other Ambulatory Visit (INDEPENDENT_AMBULATORY_CARE_PROVIDER_SITE_OTHER): Payer: Self-pay | Admitting: Vascular Surgery

## 2018-06-24 ENCOUNTER — Telehealth (INDEPENDENT_AMBULATORY_CARE_PROVIDER_SITE_OTHER): Payer: Self-pay

## 2018-06-24 ENCOUNTER — Encounter (INDEPENDENT_AMBULATORY_CARE_PROVIDER_SITE_OTHER): Payer: Self-pay

## 2018-06-24 ENCOUNTER — Other Ambulatory Visit: Payer: Self-pay

## 2018-06-24 VITALS — BP 105/62 | HR 62 | Resp 16 | Ht 64.0 in | Wt 125.0 lb

## 2018-06-24 DIAGNOSIS — I998 Other disorder of circulatory system: Secondary | ICD-10-CM

## 2018-06-24 DIAGNOSIS — I70221 Atherosclerosis of native arteries of extremities with rest pain, right leg: Secondary | ICD-10-CM | POA: Diagnosis not present

## 2018-06-24 DIAGNOSIS — I10 Essential (primary) hypertension: Secondary | ICD-10-CM

## 2018-06-24 DIAGNOSIS — K219 Gastro-esophageal reflux disease without esophagitis: Secondary | ICD-10-CM | POA: Diagnosis not present

## 2018-06-24 DIAGNOSIS — E7800 Pure hypercholesterolemia, unspecified: Secondary | ICD-10-CM | POA: Diagnosis not present

## 2018-06-24 NOTE — Progress Notes (Signed)
MRN : [id number]  Melissa [name] is a 77 y.o. (29-Jun-1941) female who presents with chief complaint of  Chief Complaint  Patient presents with  . Follow-up    right leg cold  .  History of Present Illness:   The patient returns to the office sooner than expected because of increased pain in the right toes. There has been a significant deterioration in the lower extremity symptoms.  The patient notes interval shortening of their claudication distance and development of severe rest pain symptoms. No new ulcers or wounds have occurred since the last visit.  There have been no significant changes to the patient's overall health care.  The patient denies amaurosis fugax or recent TIA symptoms. There are no recent neurological changes noted. The patient denies history of DVT, PE or superficial thrombophlebitis. The patient denies recent episodes of angina or shortness of breath.     Current Meds  Medication Sig  . acetaminophen (TYLENOL) 500 MG tablet Take 1,000 mg by mouth every 8 (eight) hours as needed for mild pain or headache.  Marland Kitchen apixaban (ELIQUIS) 5 MG TABS tablet Take 1 tablet (5 mg total) by mouth 2 (two) times daily.  Marland Kitchen aspirin EC 81 MG tablet Take 1 tablet (81 mg total) by mouth daily.  . budesonide (ENTOCORT EC) 3 MG 24 hr capsule Take 3 capsules (9 mg total) by mouth daily. Taper down to 3 mg (Patient taking differently: Take 3 mg by mouth daily. )  . Calcium Carbonate-Vitamin D (CALCIUM 600+D) 600-400 MG-UNIT tablet Take 1 tablet by mouth 2 (two) times daily.   . clopidogrel (PLAVIX) 75 MG tablet TAKE 1 TABLET BY MOUTH EVERY DAY  . diclofenac sodium (VOLTAREN) 1 % GEL Apply 2 g topically 2 (two) times daily as needed (for arthritis in hands).   . diltiazem (CARDIZEM CD) 240 MG 24 hr capsule Take 240 mg by mouth daily.  . feeding supplement, ENSURE ENLIVE, (ENSURE ENLIVE) LIQD Take 237 mLs by mouth 2 (two) times daily between meals.  . ferrous sulfate 325 (65 FE) MG tablet  Take 325 mg by mouth daily with breakfast.  . gemfibrozil (LOPID) 600 MG tablet Take 600 mg by mouth 2 (two) times daily before a meal.  . HYDROcodone-acetaminophen (NORCO) 5-325 MG tablet Take 1-2 tablets by mouth every 6 (six) hours as needed for moderate pain or severe pain.  . hydroxychloroquine (PLAQUENIL) 200 MG tablet Take 200 mg by mouth 2 (two) times daily.   Marland Kitchen leflunomide (ARAVA) 20 MG tablet Take 20 mg by mouth daily.  Marland Kitchen lisinopril (PRINIVIL,ZESTRIL) 10 MG tablet Take 10 mg by mouth daily.  Marland Kitchen LORazepam (ATIVAN) 1 MG tablet Take 1 tablet (1 mg total) by mouth 2 (two) times daily. (Patient taking differently: Take 1 mg by mouth every 8 (eight) hours as needed for anxiety. )  . Multiple Vitamin (MULTIVITAMIN WITH MINERALS) TABS tablet Take 1 tablet by mouth daily.  . Multiple Vitamins-Minerals (PRESERVISION AREDS) CAPS Take 1 capsule by mouth 2 (two) times daily.  . mupirocin ointment (BACTROBAN) 2 % Apply to affected area 2 times daily  . Nutritional Supplements (NUTRITIONAL SHAKE HIGH PROTEIN) LIQD Take 237 mLs by mouth daily. Slim Fast protein  . omeprazole (PRILOSEC) 20 MG capsule Take 20 mg by mouth 2 (two) times daily before a meal.   . pravastatin (PRAVACHOL) 80 MG tablet Take 80 mg by mouth at bedtime.   . predniSONE (DELTASONE) 5 MG tablet Take 5 mg by mouth daily with  breakfast.   . sertraline (ZOLOFT) 100 MG tablet Take 100 mg by mouth daily.  . traMADol (ULTRAM) 50 MG tablet Take 1 tablet (50 mg total) by mouth every 6 (six) hours as needed for moderate pain.    Past Medical History:  Diagnosis Date  . Anemia 12/2017   blood transfusion after amputation  . Anxiety   . Arthritis    rheumatoid  . Ataxia   . Chronic ulcer of right great toe ([place]) 2019   slowly healing  . COPD (chronic obstructive pulmonary disease) ([place])   . Coronary artery disease 2018   complete blockage in one vessel, 40 % in another, 25% in 3rd vessell  . Depression   . Diverticulosis   .  Dysrhythmia    atrial fib  . Excessive falling   . GERD (gastroesophageal reflux disease)    ulcerative colitis  . Hypercholesteremia   . Hyperlipidemia   . Hypertension   . IBS (irritable bowel syndrome)   . MRSA (methicillin resistant staph aureus) culture positive 11/2017   patient unaware of this diagnosis, was never informed or treated  . Myocardial infarction ([place]) 08/2017   had angio but no stents  . Osteoarthritis   . Psoriasis   . Raynaud's disease without gangrene   . Raynaud's disease without gangrene   . Shortness of breath dyspnea    with exertion  . 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 ABDOMINAL HYSTERECTOMY  1980  . AMPUTATION Left 12/12/2017   Procedure: AMPUTATION BELOW KNEE;  Surgeon: Katha Cabal, MD;  Location: [facility];  Service: Vascular;  Laterality: Left;  . AMPUTATION Left 02/08/2018   Procedure: AMPUTATION BELOW KNEE ( REVISION );  Surgeon: Katha Cabal, MD;  Location: [facility];  Service: Vascular;  Laterality: Left;  . APPENDECTOMY  1956  . APPLICATION OF WOUND VAC Left 01/25/2018   Procedure: APPLICATION OF WOUND VAC;  Surgeon: Katha Cabal, MD;  Location: [facility];  Service: Vascular;  Laterality: Left;  . APPLICATION OF WOUND VAC Left 03/08/2018   Procedure: APPLICATION OF WOUND VAC;  Surgeon: Katha Cabal, MD;  Location: [facility];  Service: Vascular;  Laterality: Left;  . BACK SURGERY  2013   Spinal fusion with rods and screws  . CHOLECYSTECTOMY    . COLONOSCOPY WITH PROPOFOL N/A 01/29/2017   Procedure: COLONOSCOPY WITH PROPOFOL;  Surgeon: Manya Silvas, MD;  Location: [facility];  Service: Endoscopy;  Laterality: N/A;  . ESOPHAGOGASTRODUODENOSCOPY (EGD) WITH PROPOFOL N/A 01/29/2017   Procedure: ESOPHAGOGASTRODUODENOSCOPY (EGD) WITH PROPOFOL;  Surgeon: Manya Silvas, MD;  Location: [facility] ENDOSCOPY;  Service: Endoscopy;  Laterality:  N/A;  . EYE SURGERY Bilateral 2012   Cataract Extraction with IOL  . KNEE ARTHROSCOPY Left 2009   partial lateral meniscectomy, debridement, excision of Baker's cyst  . LEFT HEART CATH AND CORONARY ANGIOGRAPHY N/A 04/23/2017   Procedure: LEFT HEART CATH AND CORONARY ANGIOGRAPHY;  Surgeon: Teodoro Spray, MD;  Location: [place] CV LAB;  Service: Cardiovascular;  Laterality: N/A;  . LEFT HEART CATH AND CORONARY ANGIOGRAPHY Left 09/07/2017   Procedure: LEFT HEART CATH AND CORONARY ANGIOGRAPHY;  Surgeon: Teodoro Spray, MD;  Location: [place] [facility];  Service: Cardiovascular;  Laterality: Left;  . LOWER EXTREMITY ANGIOGRAPHY Left 11/27/2017   Procedure: LOWER EXTREMITY ANGIOGRAPHY;  Surgeon: Katha Cabal, MD;  Location: [place] [facility];  Service: Cardiovascular;  Laterality: Left;  . LOWER EXTREMITY ANGIOGRAPHY Left 12/05/2017   Procedure: LOWER EXTREMITY ANGIOGRAPHY;  Surgeon: Katha Cabal, MD;  Location: [place] [facility];  Service: Cardiovascular;  Laterality: Left;  . LOWER EXTREMITY ANGIOGRAPHY Right 06/21/2018   Procedure: LOWER EXTREMITY ANGIOGRAPHY;  Surgeon: Katha Cabal, MD;  Location: [place] [facility];  Service: Cardiovascular;  Laterality: Right;  . ROTATOR CUFF REPAIR Right 1990  . TOTAL KNEE ARTHROPLASTY Left 04/04/2016   Procedure: TOTAL KNEE ARTHROPLASTY;  Surgeon: Hessie Knows, MD;  Location: [facility];  Service: Orthopedics;  Laterality: Left;  . WOUND DEBRIDEMENT Left 01/25/2018   Procedure: DEBRIDEMENT WOUND;  Surgeon: Katha Cabal, MD;  Location: [facility];  Service: Vascular;  Laterality: Left;  . WOUND DEBRIDEMENT Left 03/08/2018   Procedure: DEBRIDEMENT WOUND;  Surgeon: Katha Cabal, MD;  Location: [facility];  Service: Vascular;  Laterality: Left;    Social History Social History   Tobacco Use  . Smoking status: Never Smoker  . Smokeless tobacco: Never Used  Substance Use Topics  . Alcohol use: No  . Drug use: No     Family History Family History  Problem Relation Age of Onset  . CAD Mother   . CAD Father   . Alzheimer's disease Sister   . CAD Sister   . CAD Brother   . Breast cancer Neg Hx     Allergies  Allergen Reactions  . Lodine [Etodolac] Hives and Itching  . Methotrexate Derivatives Other (See Comments)    Flu like symptoms  . Nitrofuran Derivatives Nausea And Vomiting and Other (See Comments)    Made sicker   . Levaquin [Levofloxacin] Itching, Rash and Other (See Comments)    Muscle pain  . Norvasc [Amlodipine] Other (See Comments)    Unknown   . Pentasa [Mesalamine Er] Other (See Comments)    Unknown   . Sulfa Antibiotics Itching and Rash  . Vioxx [Rofecoxib] Other (See Comments)    Unknown      REVIEW OF SYSTEMS (Negative unless checked)  Constitutional: [] Weight loss  [] Fever  [] Chills Cardiac: [] Chest pain   [] Chest pressure   [] Palpitations   [] Shortness of breath when laying flat   [] Shortness of breath with exertion. Vascular:  [x] Pain in legs with walking   [x] Pain in legs at rest  [] History of DVT   [] Phlebitis   [] Swelling in legs   [] Varicose veins   [x] Non-healing ulcers Pulmonary:   [] Uses home oxygen   [] Productive cough   [] Hemoptysis   [] Wheeze  [] COPD   [] Asthma Neurologic:  [] Dizziness   [] Seizures   [] History of stroke   [] History of TIA  [] Aphasia   [] Vissual changes   [] Weakness or numbness in arm   [] Weakness or numbness in leg Musculoskeletal:   [] Joint swelling   [] Joint pain   [] Low back pain Hematologic:  [] Easy bruising  [] Easy bleeding   [] Hypercoagulable state   [] Anemic Gastrointestinal:  [] Diarrhea   [] Vomiting  [] Gastroesophageal reflux/heartburn   [] Difficulty swallowing. Genitourinary:  [] Chronic kidney disease   [] Difficult urination  [] Frequent urination   [] Blood in urine Skin:  [] Rashes   [x] Ulcers  Psychological:  [] History of anxiety   []  History of major depression.  Physical Examination  Vitals:   06/24/18 1346  BP: 105/62   Pulse: 62  Resp: 16  Weight: 125 lb (56.7 kg)  Height: 5' 4"  (1.626 m)   Body mass index is 21.46 kg/m. Gen: WD/WN, NAD Head: [place]/AT, No temporalis wasting.  Ear/Nose/Throat:  Hearing grossly intact, nares w/o erythema or drainage Eyes: PER, EOMI, sclera nonicteric.  Neck: Supple, no large masses.   Pulmonary:  Good air movement, no audible wheezing bilaterally, no use of accessory muscles.  Cardiac: RRR, no JVD Vascular: right forefoot is cold to touch with poor cap refill. Vessel Right Left  Radial Palpable Palpable  Popliteal Palpable Not Palpable  PT Not Palpable BKA  DP Not Palpable BKA  Gastrointestinal: Non-distended. No guarding/no peritoneal signs.  Musculoskeletal: M/S 5/5 throughout.  No deformity or atrophy.  Neurologic: CN 2-12 intact. Symmetrical.  Speech is fluent. Motor exam as listed above. Psychiatric: Judgment intact, Mood & affect appropriate for pt's clinical situation. Dermatologic: No rashes + ulcers noted.  No changes consistent with cellulitis. Lymph : No lichenification or skin changes of chronic lymphedema.  CBC Lab Results  Component Value Date   WBC 11.0 03/08/2018   HGB 10.9 (L) 03/08/2018   HCT 32.2 (L) 03/08/2018   MCV 93.6 03/08/2018   PLT 290 03/08/2018    BMET    Component Value Date/Time   NA 140 03/08/2018 0930   NA 139 06/16/2013 1031   K 3.9 03/08/2018 0930   K 3.8 06/16/2013 1031   CL 106 03/08/2018 0930   CL 105 06/16/2013 1031   CO2 25 03/08/2018 0930   CO2 30 06/16/2013 1031   GLUCOSE 129 (H) 03/08/2018 0930   GLUCOSE 96 06/16/2013 1031   BUN 22 06/21/2018 1227   BUN 23 (H) 06/16/2013 1031   CREATININE 0.58 06/21/2018 1227   CREATININE 0.58 (L) 06/16/2013 1031   CALCIUM 9.2 03/08/2018 0930   CALCIUM 9.7 06/16/2013 1031   GFRNONAA >60 06/21/2018 1227   GFRNONAA >60 06/16/2013 1031   GFRAA >60 06/21/2018 1227   GFRAA >60 06/16/2013 1031   Estimated Creatinine Clearance: 50.9 mL/min (by C-G formula based on SCr of  0.58 mg/dL).  COAG Lab Results  Component Value Date   INR 1.12 02/08/2018   INR 1.21 01/21/2018   INR 1.36 12/14/2017    Radiology Vas [place] Burnard Bunting With/wo Tbi  Result Date: 06/17/2018 LOWER EXTREMITY DOPPLER STUDY Indications: Peripheral artery disease, and Left BKA 12/12/2017              No inervention on right. New Right great toe soreness.  Performing Technologist: Concha Norway RVT  Examination Guidelines: A complete evaluation includes at minimum, Doppler waveform signals and systolic blood pressure reading at the level of bilateral brachial, anterior tibial, and posterior tibial arteries, when vessel segments are accessible. Bilateral testing is considered an integral part of a complete examination. Photoelectric Plethysmograph (PPG) waveforms and toe systolic pressure readings are included as required and additional duplex testing as needed. Limited examinations for reoccurring indications may be performed as noted.  ABI Findings: +---------+------------------+-----+----------+--------+ Right    Rt Pressure (mmHg)IndexWaveform  Comment  +---------+------------------+-----+----------+--------+ Brachial 147                                       +---------+------------------+-----+----------+--------+ CFA                             triphasic          +---------+------------------+-----+----------+--------+ Popliteal                       triphasic          +---------+------------------+-----+----------+--------+  ATA                             monophasicNC       +---------+------------------+-----+----------+--------+ PTA                             monophasicNC       +---------+------------------+-----+----------+--------+ Great Toe32                0.22 Abnormal           +---------+------------------+-----+----------+--------+ +-------+-----------+-----------+------------+------------+ ABI/TBIToday's ABIToday's TBIPrevious ABIPrevious TBI  +-------+-----------+-----------+------------+------------+ Right  NonComp    .32                                 +-------+-----------+-----------+------------+------------+ TOES Findings: +----------+---------------+--------+-------+ Right ToesPressure (mmHg)WaveformComment +----------+---------------+--------+-------+ 1st Digit                Abnormal        +----------+---------------+--------+-------+ 2nd Digit                Abnormal        +----------+---------------+--------+-------+ 3rd Digit                Abnormal        +----------+---------------+--------+-------+ 4th Digit                Abnormal        +----------+---------------+--------+-------+ 5th Digit                Abnormal        +----------+---------------+--------+-------+  Left BKA.  Summary: Right: Resting right ankle-brachial index indicates noncompressible right lower extremity arteries.The right toe-brachial index is abnormal. Added duplex imaging to further evaluate flow. Moderate atherosclerosis throughout. Patent vessels with no significant stenosis throughout.  *See table(s) above for measurements and observations.  Electronically signed by [name] MD on 06/17/2018 at 4:51:54 PM.    Final      Assessment/Plan 1. Ischemic foot Recommend:  The patient has evidence of severe atherosclerotic changes of both lower extremities with rest pain that is associated with ulcerative changes.  This represents a limb threatening ischemia and places the patient at the risk for limb loss.  Patient should undergo angiography of the right lower extremities with the hope for intervention for limb salvage.  The risks and benefits as well as the alternative therapies was discussed in detail with the patient.  All questions were answered.  Patient agrees to proceed with right leg angiography.  The patient will follow up with me in the office after the procedure.    2. Atherosclerosis of native artery  of right lower extremity with rest pain (HCC) Recommend:  The patient has evidence of severe atherosclerotic changes of both lower extremities with rest pain that is associated with ulcerative changes.  This represents a limb threatening ischemia and places the patient at the risk for limb loss.  Patient should undergo angiography of the right lower extremities with the hope for intervention for limb salvage.  The risks and benefits as well as the alternative therapies was discussed in detail with the patient.  All questions were answered.  Patient agrees to proceed with right leg angiography.  The patient will follow up with me in the office after the procedure.   3. Essential hypertension Continue antihypertensive medications as already ordered, these medications have been  reviewed and there are no changes at this time.   4. Gastroesophageal reflux disease without esophagitis Continue PPI as already ordered, this medication has been reviewed and there are no changes at this time.  Avoidence of caffeine and alcohol  Moderate elevation of the head of the bed   5. Pure hypercholesterolemia Continue statin as ordered and reviewed, no changes at this time   [name], MD  06/24/2018 3:21 PM

## 2018-06-24 NOTE — Telephone Encounter (Signed)
I spoke with the patient and gave instructions and her paperwork for her procedure on 06/26/18 with Dr. Delana Meyer.

## 2018-06-25 DIAGNOSIS — J449 Chronic obstructive pulmonary disease, unspecified: Secondary | ICD-10-CM | POA: Diagnosis not present

## 2018-06-25 DIAGNOSIS — I7300 Raynaud's syndrome without gangrene: Secondary | ICD-10-CM | POA: Diagnosis not present

## 2018-06-25 DIAGNOSIS — K5790 Diverticulosis of intestine, part unspecified, without perforation or abscess without bleeding: Secondary | ICD-10-CM | POA: Diagnosis not present

## 2018-06-25 DIAGNOSIS — I7090 Unspecified atherosclerosis: Secondary | ICD-10-CM | POA: Diagnosis not present

## 2018-06-25 DIAGNOSIS — D649 Anemia, unspecified: Secondary | ICD-10-CM | POA: Diagnosis not present

## 2018-06-25 DIAGNOSIS — T8789 Other complications of amputation stump: Secondary | ICD-10-CM | POA: Diagnosis not present

## 2018-06-25 DIAGNOSIS — I10 Essential (primary) hypertension: Secondary | ICD-10-CM | POA: Diagnosis not present

## 2018-06-25 DIAGNOSIS — M069 Rheumatoid arthritis, unspecified: Secondary | ICD-10-CM | POA: Diagnosis not present

## 2018-06-25 DIAGNOSIS — Z89512 Acquired absence of left leg below knee: Secondary | ICD-10-CM | POA: Diagnosis not present

## 2018-06-26 ENCOUNTER — Encounter
Admission: AD | Disposition: A | Discharge status: Skilled Nursing Facility | Payer: Self-pay | Source: Home / Self Care | Attending: Vascular Surgery

## 2018-06-26 ENCOUNTER — Inpatient Hospital Stay
Admission: AD | Admit: 2018-06-26 | Discharge: 2018-07-11 | DRG: 239 | Disposition: A | Discharge status: Skilled Nursing Facility | Payer: Medicare HMO | Attending: Family Medicine | Admitting: Family Medicine

## 2018-06-26 ENCOUNTER — Other Ambulatory Visit: Payer: Self-pay

## 2018-06-26 DIAGNOSIS — J449 Chronic obstructive pulmonary disease, unspecified: Secondary | ICD-10-CM | POA: Diagnosis present

## 2018-06-26 DIAGNOSIS — K5190 Ulcerative colitis, unspecified, without complications: Secondary | ICD-10-CM | POA: Diagnosis present

## 2018-06-26 DIAGNOSIS — Z981 Arthrodesis status: Secondary | ICD-10-CM

## 2018-06-26 DIAGNOSIS — R079 Chest pain, unspecified: Secondary | ICD-10-CM | POA: Diagnosis not present

## 2018-06-26 DIAGNOSIS — E782 Mixed hyperlipidemia: Secondary | ICD-10-CM | POA: Diagnosis present

## 2018-06-26 DIAGNOSIS — K219 Gastro-esophageal reflux disease without esophagitis: Secondary | ICD-10-CM | POA: Diagnosis present

## 2018-06-26 DIAGNOSIS — Z8614 Personal history of Methicillin resistant Staphylococcus aureus infection: Secondary | ICD-10-CM

## 2018-06-26 DIAGNOSIS — E1152 Type 2 diabetes mellitus with diabetic peripheral angiopathy with gangrene: Principal | ICD-10-CM | POA: Diagnosis present

## 2018-06-26 DIAGNOSIS — I472 Ventricular tachycardia: Secondary | ICD-10-CM | POA: Diagnosis not present

## 2018-06-26 DIAGNOSIS — F329 Major depressive disorder, single episode, unspecified: Secondary | ICD-10-CM | POA: Diagnosis present

## 2018-06-26 DIAGNOSIS — I70223 Atherosclerosis of native arteries of extremities with rest pain, bilateral legs: Secondary | ICD-10-CM | POA: Diagnosis present

## 2018-06-26 DIAGNOSIS — Z89512 Acquired absence of left leg below knee: Secondary | ICD-10-CM

## 2018-06-26 DIAGNOSIS — Z881 Allergy status to other antibiotic agents status: Secondary | ICD-10-CM

## 2018-06-26 DIAGNOSIS — I70229 Atherosclerosis of native arteries of extremities with rest pain, unspecified extremity: Secondary | ICD-10-CM

## 2018-06-26 DIAGNOSIS — I7301 Raynaud's syndrome with gangrene: Secondary | ICD-10-CM | POA: Diagnosis present

## 2018-06-26 DIAGNOSIS — M79606 Pain in leg, unspecified: Secondary | ICD-10-CM | POA: Diagnosis present

## 2018-06-26 DIAGNOSIS — F411 Generalized anxiety disorder: Secondary | ICD-10-CM | POA: Diagnosis present

## 2018-06-26 DIAGNOSIS — Z7902 Long term (current) use of antithrombotics/antiplatelets: Secondary | ICD-10-CM

## 2018-06-26 DIAGNOSIS — Z90710 Acquired absence of both cervix and uterus: Secondary | ICD-10-CM

## 2018-06-26 DIAGNOSIS — I70221 Atherosclerosis of native arteries of extremities with rest pain, right leg: Secondary | ICD-10-CM | POA: Diagnosis not present

## 2018-06-26 DIAGNOSIS — Z7901 Long term (current) use of anticoagulants: Secondary | ICD-10-CM

## 2018-06-26 DIAGNOSIS — I998 Other disorder of circulatory system: Secondary | ICD-10-CM | POA: Diagnosis present

## 2018-06-26 DIAGNOSIS — L97919 Non-pressure chronic ulcer of unspecified part of right lower leg with unspecified severity: Secondary | ICD-10-CM | POA: Diagnosis not present

## 2018-06-26 DIAGNOSIS — I10 Essential (primary) hypertension: Secondary | ICD-10-CM | POA: Diagnosis not present

## 2018-06-26 DIAGNOSIS — M25531 Pain in right wrist: Secondary | ICD-10-CM | POA: Diagnosis not present

## 2018-06-26 DIAGNOSIS — I739 Peripheral vascular disease, unspecified: Secondary | ICD-10-CM | POA: Diagnosis not present

## 2018-06-26 DIAGNOSIS — Z96652 Presence of left artificial knee joint: Secondary | ICD-10-CM | POA: Diagnosis present

## 2018-06-26 DIAGNOSIS — I959 Hypotension, unspecified: Secondary | ICD-10-CM | POA: Diagnosis not present

## 2018-06-26 DIAGNOSIS — F05 Delirium due to known physiological condition: Secondary | ICD-10-CM | POA: Diagnosis not present

## 2018-06-26 DIAGNOSIS — I480 Paroxysmal atrial fibrillation: Secondary | ICD-10-CM | POA: Diagnosis not present

## 2018-06-26 DIAGNOSIS — Z7952 Long term (current) use of systemic steroids: Secondary | ICD-10-CM

## 2018-06-26 DIAGNOSIS — I2510 Atherosclerotic heart disease of native coronary artery without angina pectoris: Secondary | ICD-10-CM | POA: Diagnosis present

## 2018-06-26 DIAGNOSIS — D508 Other iron deficiency anemias: Secondary | ICD-10-CM | POA: Diagnosis not present

## 2018-06-26 DIAGNOSIS — M069 Rheumatoid arthritis, unspecified: Secondary | ICD-10-CM | POA: Diagnosis present

## 2018-06-26 DIAGNOSIS — M6281 Muscle weakness (generalized): Secondary | ICD-10-CM | POA: Diagnosis not present

## 2018-06-26 DIAGNOSIS — I252 Old myocardial infarction: Secondary | ICD-10-CM

## 2018-06-26 DIAGNOSIS — E43 Unspecified severe protein-calorie malnutrition: Secondary | ICD-10-CM

## 2018-06-26 DIAGNOSIS — D638 Anemia in other chronic diseases classified elsewhere: Secondary | ICD-10-CM | POA: Diagnosis not present

## 2018-06-26 DIAGNOSIS — F3289 Other specified depressive episodes: Secondary | ICD-10-CM | POA: Diagnosis not present

## 2018-06-26 DIAGNOSIS — F418 Other specified anxiety disorders: Secondary | ICD-10-CM | POA: Diagnosis not present

## 2018-06-26 DIAGNOSIS — M2550 Pain in unspecified joint: Secondary | ICD-10-CM | POA: Diagnosis not present

## 2018-06-26 DIAGNOSIS — Z882 Allergy status to sulfonamides status: Secondary | ICD-10-CM

## 2018-06-26 DIAGNOSIS — Z888 Allergy status to other drugs, medicaments and biological substances status: Secondary | ICD-10-CM

## 2018-06-26 DIAGNOSIS — E7800 Pure hypercholesterolemia, unspecified: Secondary | ICD-10-CM | POA: Diagnosis not present

## 2018-06-26 DIAGNOSIS — Z8249 Family history of ischemic heart disease and other diseases of the circulatory system: Secondary | ICD-10-CM

## 2018-06-26 DIAGNOSIS — S0990XA Unspecified injury of head, initial encounter: Secondary | ICD-10-CM | POA: Diagnosis not present

## 2018-06-26 DIAGNOSIS — E7849 Other hyperlipidemia: Secondary | ICD-10-CM | POA: Diagnosis not present

## 2018-06-26 DIAGNOSIS — A419 Sepsis, unspecified organism: Secondary | ICD-10-CM | POA: Diagnosis not present

## 2018-06-26 DIAGNOSIS — E785 Hyperlipidemia, unspecified: Secondary | ICD-10-CM | POA: Diagnosis not present

## 2018-06-26 DIAGNOSIS — E039 Hypothyroidism, unspecified: Secondary | ICD-10-CM | POA: Diagnosis not present

## 2018-06-26 DIAGNOSIS — Y92230 Patient room in hospital as the place of occurrence of the external cause: Secondary | ICD-10-CM | POA: Diagnosis not present

## 2018-06-26 DIAGNOSIS — Z7401 Bed confinement status: Secondary | ICD-10-CM | POA: Diagnosis not present

## 2018-06-26 DIAGNOSIS — W06XXXA Fall from bed, initial encounter: Secondary | ICD-10-CM | POA: Diagnosis not present

## 2018-06-26 DIAGNOSIS — G8929 Other chronic pain: Secondary | ICD-10-CM | POA: Diagnosis not present

## 2018-06-26 DIAGNOSIS — Z79899 Other long term (current) drug therapy: Secondary | ICD-10-CM

## 2018-06-26 DIAGNOSIS — L409 Psoriasis, unspecified: Secondary | ICD-10-CM | POA: Diagnosis present

## 2018-06-26 DIAGNOSIS — Z681 Body mass index (BMI) 19 or less, adult: Secondary | ICD-10-CM

## 2018-06-26 DIAGNOSIS — I70239 Atherosclerosis of native arteries of right leg with ulceration of unspecified site: Secondary | ICD-10-CM | POA: Diagnosis not present

## 2018-06-26 DIAGNOSIS — I4891 Unspecified atrial fibrillation: Secondary | ICD-10-CM | POA: Diagnosis not present

## 2018-06-26 DIAGNOSIS — Z886 Allergy status to analgesic agent status: Secondary | ICD-10-CM

## 2018-06-26 DIAGNOSIS — Z7982 Long term (current) use of aspirin: Secondary | ICD-10-CM

## 2018-06-26 DIAGNOSIS — I70261 Atherosclerosis of native arteries of extremities with gangrene, right leg: Secondary | ICD-10-CM | POA: Diagnosis not present

## 2018-06-26 DIAGNOSIS — S6991XA Unspecified injury of right wrist, hand and finger(s), initial encounter: Secondary | ICD-10-CM | POA: Diagnosis not present

## 2018-06-26 DIAGNOSIS — Z9889 Other specified postprocedural states: Secondary | ICD-10-CM | POA: Diagnosis not present

## 2018-06-26 HISTORY — PX: LOWER EXTREMITY ANGIOGRAPHY: CATH118251

## 2018-06-26 LAB — MRSA PCR SCREENING: MRSA BY PCR: NEGATIVE

## 2018-06-26 LAB — CREATININE, SERUM
Creatinine, Ser: 0.57 mg/dL (ref 0.44–1.00)
GFR calc Af Amer: >60 mL/min
GFR calc non Af Amer: >60 mL/min

## 2018-06-26 LAB — GLUCOSE, CAPILLARY: Glucose-Capillary: 137 mg/dL — ABNORMAL HIGH (ref 70–99)

## 2018-06-26 LAB — BUN: BUN: 15 mg/dL (ref 8–23)

## 2018-06-26 SURGERY — LOWER EXTREMITY ANGIOGRAPHY
Anesthesia: Moderate Sedation | Laterality: Right

## 2018-06-26 MED ORDER — PRAVASTATIN SODIUM 40 MG PO TABS
80.0000 mg | ORAL_TABLET | Freq: Every day | ORAL | Status: DC
  Administered 2018-06-26 – 2018-07-10 (×15): 80 mg via ORAL
  Filled 2018-06-26 (×4): qty 2
  Filled 2018-06-26: qty 4
  Filled 2018-06-26 (×12): qty 2

## 2018-06-26 MED ORDER — HEPARIN SODIUM (PORCINE) 1000 UNIT/ML IJ SOLN
INTRAMUSCULAR | Status: DC | PRN
  Administered 2018-06-26: 5000 [IU] via INTRAVENOUS

## 2018-06-26 MED ORDER — HYDROMORPHONE HCL 1 MG/ML IJ SOLN: 1.0000 mg | INTRAMUSCULAR | Status: DC | PRN

## 2018-06-26 MED ORDER — CALCIUM CARBONATE-VITAMIN D 500-200 MG-UNIT PO TABS
1.0000 | ORAL_TABLET | Freq: Two times a day (BID) | ORAL | Status: DC
  Administered 2018-06-26 – 2018-07-11 (×29): 1 via ORAL
  Filled 2018-06-26 (×30): qty 1

## 2018-06-26 MED ORDER — MIDAZOLAM HCL 2 MG/ML PO SYRP
8.0000 mg | ORAL_SOLUTION | Freq: Once | ORAL | Status: AC
  Administered 2018-06-26: 8 mg via ORAL

## 2018-06-26 MED ORDER — HEPARIN (PORCINE) IN NACL 1000-0.9 UT/500ML-% IV SOLN
INTRAVENOUS | Status: AC
  Filled 2018-06-26: qty 1000

## 2018-06-26 MED ORDER — HYDROMORPHONE HCL 1 MG/ML IJ SOLN
INTRAMUSCULAR | Status: AC
  Filled 2018-06-26: qty 0.5

## 2018-06-26 MED ORDER — GABAPENTIN 300 MG PO CAPS
300.0000 mg | ORAL_CAPSULE | Freq: Three times a day (TID) | ORAL | Status: DC
  Administered 2018-06-26 – 2018-07-04 (×22): 300 mg via ORAL
  Filled 2018-06-26 (×22): qty 1

## 2018-06-26 MED ORDER — SODIUM CHLORIDE (PF) 0.9 % IJ SOLN
INTRAMUSCULAR | Status: AC
  Filled 2018-06-26: qty 50

## 2018-06-26 MED ORDER — HYDROMORPHONE HCL 1 MG/ML IJ SOLN
0.5000 mg | Freq: Once | INTRAMUSCULAR | Status: AC
  Administered 2018-06-26: 0.5 mg via INTRAVENOUS

## 2018-06-26 MED ORDER — FENTANYL CITRATE (PF) 100 MCG/2ML IJ SOLN
INTRAMUSCULAR | Status: DC | PRN
  Administered 2018-06-26: 25 ug via INTRAVENOUS
  Administered 2018-06-26 (×2): 50 ug via INTRAVENOUS
  Administered 2018-06-26 (×4): 25 ug via INTRAVENOUS

## 2018-06-26 MED ORDER — ASPIRIN EC 81 MG PO TBEC
81.0000 mg | DELAYED_RELEASE_TABLET | Freq: Every day | ORAL | Status: DC
  Administered 2018-06-27 – 2018-07-11 (×14): 81 mg via ORAL
  Filled 2018-06-26 (×14): qty 1

## 2018-06-26 MED ORDER — MIDAZOLAM HCL 5 MG/5ML IJ SOLN
INTRAMUSCULAR | Status: AC
  Filled 2018-06-26: qty 5

## 2018-06-26 MED ORDER — VERAPAMIL HCL 2.5 MG/ML IV SOLN
INTRAVENOUS | Status: DC | PRN
  Administered 2018-06-26 (×2): 1.25 mg via INTRA_ARTERIAL

## 2018-06-26 MED ORDER — TIROFIBAN HCL IN NACL 5-0.9 MG/100ML-% IV SOLN
0.1500 ug/kg/min | INTRAVENOUS | Status: DC
  Administered 2018-06-26: 0.15 ug/kg/min via INTRAVENOUS
  Filled 2018-06-26 (×2): qty 100

## 2018-06-26 MED ORDER — SODIUM CHLORIDE 0.9 % IV SOLN: INTRAVENOUS | Status: AC

## 2018-06-26 MED ORDER — DILTIAZEM HCL ER COATED BEADS 120 MG PO CP24
240.0000 mg | ORAL_CAPSULE | Freq: Every day | ORAL | Status: DC
  Administered 2018-06-27 – 2018-07-11 (×14): 240 mg via ORAL
  Filled 2018-06-26 (×7): qty 2
  Filled 2018-06-26: qty 1
  Filled 2018-06-26 (×7): qty 2

## 2018-06-26 MED ORDER — SODIUM CHLORIDE 0.9% FLUSH: 3.0000 mL | INTRAVENOUS | Status: DC | PRN

## 2018-06-26 MED ORDER — MIDAZOLAM HCL 2 MG/2ML IJ SOLN
INTRAMUSCULAR | Status: DC | PRN
  Administered 2018-06-26 (×5): 1 mg via INTRAVENOUS

## 2018-06-26 MED ORDER — FENTANYL CITRATE (PF) 100 MCG/2ML IJ SOLN
INTRAMUSCULAR | Status: AC
  Filled 2018-06-26: qty 2

## 2018-06-26 MED ORDER — NITROGLYCERIN 1 MG/10 ML FOR IR/CATH LAB
INTRA_ARTERIAL | Status: DC | PRN
  Administered 2018-06-26: 250 ug via INTRA_ARTERIAL
  Administered 2018-06-26 (×2): 500 ug via INTRA_ARTERIAL

## 2018-06-26 MED ORDER — BUDESONIDE 3 MG PO CPEP
9.0000 mg | ORAL_CAPSULE | Freq: Every day | ORAL | Status: DC
  Administered 2018-06-27: 3 mg via ORAL
  Administered 2018-06-29 – 2018-07-05 (×7): 9 mg via ORAL
  Filled 2018-06-26 (×9): qty 3

## 2018-06-26 MED ORDER — LABETALOL HCL 5 MG/ML IV SOLN: 10.0000 mg | INTRAVENOUS | Status: DC | PRN

## 2018-06-26 MED ORDER — SODIUM CHLORIDE 0.9 % IV SOLN
INTRAVENOUS | Status: DC
  Administered 2018-06-26: 11:00:00 via INTRAVENOUS

## 2018-06-26 MED ORDER — PANTOPRAZOLE SODIUM 40 MG PO TBEC
40.0000 mg | DELAYED_RELEASE_TABLET | Freq: Every day | ORAL | Status: DC
  Administered 2018-06-27 – 2018-07-11 (×15): 40 mg via ORAL
  Filled 2018-06-26 (×15): qty 1

## 2018-06-26 MED ORDER — CEFAZOLIN SODIUM-DEXTROSE 2-4 GM/100ML-% IV SOLN
2.0000 g | Freq: Once | INTRAVENOUS | Status: AC
  Administered 2018-06-26: 2 g via INTRAVENOUS

## 2018-06-26 MED ORDER — VERAPAMIL HCL 2.5 MG/ML IV SOLN
INTRAVENOUS | Status: AC
  Filled 2018-06-26: qty 2

## 2018-06-26 MED ORDER — HYDROXYCHLOROQUINE SULFATE 200 MG PO TABS
200.0000 mg | ORAL_TABLET | Freq: Two times a day (BID) | ORAL | Status: DC
  Administered 2018-06-26 – 2018-07-11 (×30): 200 mg via ORAL
  Filled 2018-06-26 (×31): qty 1

## 2018-06-26 MED ORDER — ORAL CARE MOUTH RINSE
15.0000 mL | Freq: Two times a day (BID) | OROMUCOSAL | Status: DC
  Administered 2018-06-27 – 2018-07-10 (×15): 15 mL via OROMUCOSAL

## 2018-06-26 MED ORDER — LORAZEPAM 1 MG PO TABS
1.0000 mg | ORAL_TABLET | Freq: Three times a day (TID) | ORAL | Status: DC | PRN
  Administered 2018-06-27 – 2018-07-04 (×14): 1 mg via ORAL
  Filled 2018-06-26 (×15): qty 1

## 2018-06-26 MED ORDER — GEMFIBROZIL 600 MG PO TABS
600.0000 mg | ORAL_TABLET | Freq: Two times a day (BID) | ORAL | Status: DC
  Administered 2018-06-27 – 2018-07-11 (×26): 600 mg via ORAL
  Filled 2018-06-26 (×30): qty 1

## 2018-06-26 MED ORDER — HYDRALAZINE HCL 20 MG/ML IJ SOLN: 5.0000 mg | INTRAMUSCULAR | Status: DC | PRN

## 2018-06-26 MED ORDER — HYDROMORPHONE HCL 1 MG/ML IJ SOLN
0.5000 mg | INTRAMUSCULAR | Status: DC | PRN
  Administered 2018-06-26 – 2018-07-01 (×9): 1 mg via INTRAVENOUS
  Administered 2018-07-02: 0.5 mg via INTRAVENOUS
  Administered 2018-07-02: 1 mg via INTRAVENOUS
  Filled 2018-06-26 (×11): qty 1

## 2018-06-26 MED ORDER — FERROUS SULFATE 325 (65 FE) MG PO TABS
325.0000 mg | ORAL_TABLET | Freq: Every day | ORAL | Status: DC
  Administered 2018-06-27 – 2018-07-11 (×13): 325 mg via ORAL
  Filled 2018-06-26 (×16): qty 1

## 2018-06-26 MED ORDER — ACETAMINOPHEN 500 MG PO TABS
1000.0000 mg | ORAL_TABLET | Freq: Three times a day (TID) | ORAL | Status: DC | PRN
  Administered 2018-06-29 – 2018-07-08 (×12): 1000 mg via ORAL
  Filled 2018-06-26 (×12): qty 2

## 2018-06-26 MED ORDER — IOPAMIDOL (ISOVUE-300) INJECTION 61%
INTRAVENOUS | Status: DC | PRN
  Administered 2018-06-26: 50 mL via INTRAVENOUS

## 2018-06-26 MED ORDER — HYDROMORPHONE HCL 1 MG/ML IJ SOLN: 0.5000 mg | Freq: Once | INTRAMUSCULAR | Status: DC

## 2018-06-26 MED ORDER — FAMOTIDINE 20 MG PO TABS: 40.0000 mg | ORAL_TABLET | ORAL | Status: DC | PRN

## 2018-06-26 MED ORDER — TIROFIBAN (AGGRASTAT) BOLUS VIA INFUSION
25.0000 ug/kg | Freq: Once | INTRAVENOUS | Status: AC
  Administered 2018-06-26: 1417.5 ug via INTRAVENOUS
  Filled 2018-06-26: qty 29

## 2018-06-26 MED ORDER — PREDNISONE 10 MG PO TABS
5.0000 mg | ORAL_TABLET | Freq: Every day | ORAL | Status: DC
  Administered 2018-06-27 – 2018-07-11 (×15): 5 mg via ORAL
  Filled 2018-06-26 (×15): qty 1

## 2018-06-26 MED ORDER — MUPIROCIN 2 % EX OINT
TOPICAL_OINTMENT | Freq: Every day | CUTANEOUS | Status: DC
  Administered 2018-06-27 – 2018-07-01 (×3): via TOPICAL
  Filled 2018-06-26 (×2): qty 22

## 2018-06-26 MED ORDER — LIDOCAINE HCL (PF) 1 % IJ SOLN
INTRAMUSCULAR | Status: AC
  Filled 2018-06-26: qty 30

## 2018-06-26 MED ORDER — SERTRALINE HCL 50 MG PO TABS
100.0000 mg | ORAL_TABLET | Freq: Every day | ORAL | Status: DC
  Administered 2018-06-27 – 2018-07-11 (×14): 100 mg via ORAL
  Filled 2018-06-26 (×10): qty 2
  Filled 2018-06-26: qty 1
  Filled 2018-06-26 (×4): qty 2

## 2018-06-26 MED ORDER — NUTRITIONAL SHAKE HIGH PROTEIN PO LIQD: 237.0000 mL | Freq: Every day | ORAL | Status: DC

## 2018-06-26 MED ORDER — LEFLUNOMIDE 20 MG PO TABS
20.0000 mg | ORAL_TABLET | Freq: Every day | ORAL | Status: DC
  Administered 2018-06-27 – 2018-07-11 (×14): 20 mg via ORAL
  Filled 2018-06-26 (×15): qty 1

## 2018-06-26 MED ORDER — ENSURE ENLIVE PO LIQD
237.0000 mL | Freq: Two times a day (BID) | ORAL | Status: DC
  Administered 2018-06-27 – 2018-07-10 (×10): 237 mL via ORAL

## 2018-06-26 MED ORDER — PRESERVISION AREDS PO CAPS: 1.0000 | ORAL_CAPSULE | Freq: Two times a day (BID) | ORAL | Status: DC

## 2018-06-26 MED ORDER — NITROGLYCERIN 5 MG/ML IV SOLN
INTRAVENOUS | Status: AC
  Filled 2018-06-26: qty 10

## 2018-06-26 MED ORDER — SODIUM CHLORIDE 0.9 % IV SOLN
250.0000 mL | INTRAVENOUS | Status: DC | PRN
  Administered 2018-07-01: 14:00:00 via INTRAVENOUS
  Administered 2018-07-06: 250 mL via INTRAVENOUS

## 2018-06-26 MED ORDER — MIDAZOLAM HCL 2 MG/ML PO SYRP
ORAL_SOLUTION | ORAL | Status: AC
  Filled 2018-06-26: qty 4

## 2018-06-26 MED ORDER — SODIUM CHLORIDE 0.9% FLUSH
3.0000 mL | Freq: Two times a day (BID) | INTRAVENOUS | Status: DC
  Administered 2018-06-26 – 2018-07-10 (×27): 3 mL via INTRAVENOUS

## 2018-06-26 MED ORDER — HEPARIN SODIUM (PORCINE) 1000 UNIT/ML IJ SOLN
INTRAMUSCULAR | Status: AC
  Filled 2018-06-26: qty 1

## 2018-06-26 MED ORDER — METHYLPREDNISOLONE SODIUM SUCC 125 MG IJ SOLR: 125.0000 mg | INTRAMUSCULAR | Status: DC | PRN

## 2018-06-26 MED ORDER — ADULT MULTIVITAMIN W/MINERALS CH
1.0000 | ORAL_TABLET | Freq: Every day | ORAL | Status: DC
  Administered 2018-06-27 – 2018-07-11 (×12): 1 via ORAL
  Filled 2018-06-26 (×14): qty 1

## 2018-06-26 MED ORDER — OXYCODONE HCL 5 MG PO TABS
5.0000 mg | ORAL_TABLET | ORAL | Status: DC | PRN
  Administered 2018-06-26 – 2018-06-28 (×7): 5 mg via ORAL
  Administered 2018-06-29: 10 mg via ORAL
  Administered 2018-06-29: 5 mg via ORAL
  Administered 2018-06-30: 10 mg via ORAL
  Administered 2018-06-30: 5 mg via ORAL
  Administered 2018-06-30 – 2018-07-02 (×5): 10 mg via ORAL
  Administered 2018-07-02: 5 mg via ORAL
  Administered 2018-07-03: 10 mg via ORAL
  Administered 2018-07-03: 5 mg via ORAL
  Filled 2018-06-26 (×3): qty 2
  Filled 2018-06-26 (×2): qty 1
  Filled 2018-06-26 (×2): qty 2
  Filled 2018-06-26: qty 1
  Filled 2018-06-26: qty 2
  Filled 2018-06-26 (×2): qty 1
  Filled 2018-06-26 (×4): qty 2
  Filled 2018-06-26 (×4): qty 1

## 2018-06-26 MED ORDER — LISINOPRIL 10 MG PO TABS
10.0000 mg | ORAL_TABLET | Freq: Every day | ORAL | Status: DC
  Administered 2018-06-27: 5 mg via ORAL
  Administered 2018-06-28 – 2018-07-05 (×6): 10 mg via ORAL
  Filled 2018-06-26 (×2): qty 1
  Filled 2018-06-26: qty 2
  Filled 2018-06-26 (×7): qty 1

## 2018-06-26 MED ORDER — ACETAMINOPHEN 325 MG PO TABS: 650.0000 mg | ORAL_TABLET | ORAL | Status: DC | PRN

## 2018-06-26 MED ORDER — ONDANSETRON HCL 4 MG/2ML IJ SOLN
4.0000 mg | Freq: Four times a day (QID) | INTRAMUSCULAR | Status: DC | PRN
  Administered 2018-06-28 – 2018-07-01 (×2): 4 mg via INTRAVENOUS

## 2018-06-26 SURGICAL SUPPLY — 31 items
BALLN ULTRVRSE 2X150X150 (BALLOONS) ×3
BALLOON ULTRVRSE 2X150X150 (BALLOONS) ×1 IMPLANT
CANISTER PENUMBRA ENGINE (MISCELLANEOUS) ×3 IMPLANT
CATH BEACON 5 .035 65 RIM TIP (CATHETERS) ×3 IMPLANT
CATH CROSSER S6 154CM (CATHETERS) ×3 IMPLANT
CATH INDIGO CAT RX KIT (CATHETERS) ×3 IMPLANT
CATH SEEKER .018X150 (CATHETERS) ×3 IMPLANT
CATH SEEKER .035X150CM (CATHETERS) ×3 IMPLANT
CATH SEEKER 014X150 (CATHETERS) ×3 IMPLANT
CATH USHER TPER 130CM (CATHETERS) ×3 IMPLANT
CATH VERT 5FR 125CM (CATHETERS) ×3 IMPLANT
COVER DRAPE FLUORO 36X44 (DRAPES) ×3 IMPLANT
CROWN STEALTH MICRO-30 1.25MM (CATHETERS) ×3 IMPLANT
DEVICE PRESTO INFLATION (MISCELLANEOUS) ×3 IMPLANT
DEVICE SAFEGUARD 24CM (GAUZE/BANDAGES/DRESSINGS) ×3 IMPLANT
DEVICE STARCLOSE SE CLOSURE (Vascular Products) ×3 IMPLANT
DEVICE TORQUE .025-.038 (MISCELLANEOUS) ×3 IMPLANT
GUIDEWIRE PFTE-COATED .018X300 (WIRE) ×3 IMPLANT
GUIDEWIRE STR TIP .014X300X8 (WIRE) ×3 IMPLANT
NEEDLE ENTRY 21GA 7CM ECHOTIP (NEEDLE) ×3 IMPLANT
PACK ANGIOGRAPHY (CUSTOM PROCEDURE TRAY) ×3 IMPLANT
SET INTRO CAPELLA COAXIAL (SET/KITS/TRAYS/PACK) ×3 IMPLANT
SHEATH BRITE TIP 6FRX11 (SHEATH) ×3 IMPLANT
SHEATH RAABE 6FR (SHEATH) ×3 IMPLANT
SHIELD RADPAD SCOOP 12X17 (MISCELLANEOUS) ×6 IMPLANT
VALVE HEMO TOUHY BORST Y (ADAPTER) ×3 IMPLANT
WIRE AQUATRACK .035X260CM (WIRE) ×3 IMPLANT
WIRE G V18X300CM (WIRE) ×3 IMPLANT
WIRE J 3MM .035X145CM (WIRE) ×3 IMPLANT
WIRE SPARTACORE .014X300CM (WIRE) ×3 IMPLANT
WIRE VIPER WIRECTO 0.014 (WIRE) ×3 IMPLANT

## 2018-06-26 NOTE — Progress Notes (Signed)
Pt. Crying, stating she is still in extreme pain '9"/10 (even after dilaudid IV) . Called Dr. Ferrel Logan orders received. Pt. Med. With Versed 8 mg p.o. Liquid now. Pt. states "it just hursts so bad" (foot).

## 2018-06-26 NOTE — H&P (Signed)
[facility] History & Physical Update  The patient was interviewed and re-examined.  The patient's previous History and Physical has been reviewed and is unchanged.  There is no change in the plan of care. We plan to proceed with the scheduled procedure.  [name], MD  06/26/2018, 11:54 AM

## 2018-06-26 NOTE — Progress Notes (Signed)
Pt. Desaturated to mid 80's; attempted venti mask , then put pt. On NRB mask with immed. Sat. To 98%. Pt. Responds to verbal stimuli now. Pt. Daughter at bedside. No c/o foot pain at present.

## 2018-06-26 NOTE — Op Note (Signed)
[facility] Percutaneous Study/Intervention Procedural Note   Date of Surgery: 06/26/2018  Surgeon: [name]  Pre-operative Diagnosis: Atherosclerotic occlusive disease bilateral lower extremities with ulceration of the right lower extremity  Post-operative diagnosis: Same  Procedure(s) Performed: 1. Introduction catheter into right lower extremity 3rd order catheter placement with additional third order 2. Contrast injection right lower extremity for distal runoff with additional 3rd order  3. CSI atherectomy with percutaneous transluminal angioplasty right anterior tibial artery              4. Percutaneous transluminal angioplasty right posterior tibial artery  5. Star close closure left common femoral arteriotomy  Anesthesia: Conscious sedation was administered under my direct supervision by the interventional radiology RN. IV Versed plus fentanyl were utilized. Continuous ECG, pulse oximetry and blood pressure was monitored throughout the entire procedure.  Conscious sedation was for a total of 2 hour 45 minutes.  Sheath: 6 [place] Raby left common femoral retrograde  Contrast: 80 cc  Fluoroscopy Time: 19.1 minutes  Indications: [name] presents with increasing pain of the right lower extremity.  She has developed ulceration and ischemic rest pain.  This suggests the patient is having limb threatening ischemia. The risks and benefits are reviewed all questions answered patient agrees to proceed.  Procedure:Yanelli S Bourget is a 77 y.o. y.o. female who was identified and appropriate procedural time out was performed. The patient was then placed supine on the table and prepped and draped in the usual sterile fashion.   Ultrasound was placed in the sterile sleeve and the left groin was evaluated the left common femoral artery was echolucent and pulsatile indicating patency. Image  was recorded for the permanent record and under real-time visualization a microneedle was inserted into the common femoral artery followed by the microwire and then the micro-sheath. A J-wire was then advanced through the micro-sheath and a6 [place] sheath was then inserted over a J-wire.  Subsequently a rim catheter with the stiff angle Glidewire was used to cross the aortic bifurcation the catheter wire were advanced down into the right distal external iliac artery. Oblique view of the femoral bifurcation was then obtained and subsequently the wire was reintroduced and the pigtail catheter negotiated into the SFA representing third order catheter placement. Distal runoff was then performed.  Interpretation: The previous SFA and popliteal intervention is widely patent.  The proximal portion of the anterior tibial is patent.  Distally it occludes.  This is a similar finding to the previous angiogram.  The tibioperoneal trunk is widely patent and the peroneal artery is patent in its proximal half distally it occludes.  The posterior tibial artery is patent in its proximal two thirds however there are multiple stenoses of greater than 80% noted.  Distally at the ankle there is poor visualization probable occlusion with poor filling of the medial and lateral plantar vessels.  Based on these findings I elected to proceed with intervention.  5000 units of heparin was then given and allowed to circulate and a 6 [place] Raby sheath was advanced up and over the bifurcation and positioned in the femoral artery  Angled catheter and stiff angle Glidewire were then negotiated down into the posterior tibial. Catheter was then advanced. Hand injection contrast demonstrated the posterior tibial tibial anatomy in detail.  2 mm x 15 cm Ultraverse balloon was used to angioplasty the proximal anterior tibial.  The inflation was for 1 minute at 12 atm. Follow-up imaging demonstrated  patency with less than  20% residual  stenosis in the area that was treated distal runoff remains heavily diseased and attempts at crossing into the lateral and medial plantar vessels is not successful.  The detector was then repositioned and the distal popliteal was re-imaged.   Wire and catheter negotiated down the anterior tibial hand-injection contrast was utilized to demonstrate the distal anatomy which is heavily diseased.  A Viper wire is then used in combination with a seeker catheter to negotiate the wire all the way out to the plantar arch.  The CSI diamondback catheter is then used to treat the distal one third of the anterior tibial passes at all 3 speeds are made extending out onto the dorsalis pedis.  Follow-up balloon angioplasty with a 2 mm x 15 cm ultra versed balloon is then performed.  At the completion of this procedure with contrast injection into the anterior tibial there is now patency of the anterior tibial filling all the way out to the plantar arch.  There is less than 10% residual stenosis within the anterior tibial artery.  After review of these images the sheath is pulled into the left external iliac oblique of the common femoral is obtained and a Star close device deployed. There no immediate Complications.  Findings:  The right common femoral is  patent as is the profunda femoris.  The SFA and popliteal remain patent following previous intervention.  Following angioplasty the posterior tibial now is now patent in its proximal portion with less than 20% residual stenosis. Angioplasty with diamondback atherectomy of the distal one third of the anterior tibial and dorsalis pedis also yields an good result with less than 10% residual stenosis.   Disposition: Patient was taken to the recovery room in stable condition having tolerated the procedure well.  Belenda Cruise Jere Bostrom 06/26/2018,5:59 PM

## 2018-06-27 ENCOUNTER — Other Ambulatory Visit (INDEPENDENT_AMBULATORY_CARE_PROVIDER_SITE_OTHER): Payer: Self-pay | Admitting: Vascular Surgery

## 2018-06-27 ENCOUNTER — Ambulatory Visit (INDEPENDENT_AMBULATORY_CARE_PROVIDER_SITE_OTHER): Payer: Medicare HMO | Admitting: Vascular Surgery

## 2018-06-27 ENCOUNTER — Encounter: Payer: Self-pay | Admitting: Vascular Surgery

## 2018-06-27 DIAGNOSIS — I70229 Atherosclerosis of native arteries of extremities with rest pain, unspecified extremity: Secondary | ICD-10-CM

## 2018-06-27 DIAGNOSIS — Z9889 Other specified postprocedural states: Secondary | ICD-10-CM

## 2018-06-27 LAB — CBC
HCT: 27.1 % — ABNORMAL LOW (ref 36.0–46.0)
Hemoglobin: 8.3 g/dL — ABNORMAL LOW (ref 12.0–15.0)
MCH: 31.9 pg (ref 26.0–34.0)
MCHC: 30.6 g/dL (ref 30.0–36.0)
MCV: 104.2 fL — ABNORMAL HIGH (ref 80.0–100.0)
PLATELETS: 267 10*3/uL (ref 150–400)
RBC: 2.6 MIL/uL — ABNORMAL LOW (ref 3.87–5.11)
RDW: 13.7 % (ref 11.5–15.5)
WBC: 13.8 10*3/uL — ABNORMAL HIGH (ref 4.0–10.5)
nRBC: 0 % (ref 0.0–0.2)

## 2018-06-27 LAB — TYPE AND SCREEN
ABO/RH(D): A POS
Antibody Screen: NEGATIVE

## 2018-06-27 LAB — APTT: aPTT: 35 seconds (ref 24–36)

## 2018-06-27 LAB — PROTIME-INR
INR: 1.24
Prothrombin Time: 15.5 seconds — ABNORMAL HIGH (ref 11.4–15.2)

## 2018-06-27 LAB — HEPARIN LEVEL (UNFRACTIONATED): Heparin Unfractionated: 0.28 IU/mL — ABNORMAL LOW (ref 0.30–0.70)

## 2018-06-27 MED ORDER — HEPARIN BOLUS VIA INFUSION
800.0000 [IU] | Freq: Once | INTRAVENOUS | Status: AC
  Administered 2018-06-27: 800 [IU] via INTRAVENOUS
  Filled 2018-06-27: qty 800

## 2018-06-27 MED ORDER — HEPARIN (PORCINE) 25000 UT/250ML-% IV SOLN
850.0000 [IU]/h | INTRAVENOUS | Status: DC
  Administered 2018-06-27 (×2): 750 [IU]/h via INTRAVENOUS
  Filled 2018-06-27: qty 250

## 2018-06-27 MED ORDER — HEPARIN BOLUS VIA INFUSION
2800.0000 [IU] | Freq: Once | INTRAVENOUS | Status: AC
  Administered 2018-06-27: 2800 [IU] via INTRAVENOUS
  Filled 2018-06-27: qty 2800

## 2018-06-27 MED ORDER — CEFAZOLIN SODIUM-DEXTROSE 2-4 GM/100ML-% IV SOLN
2.0000 g | Freq: Once | INTRAVENOUS | Status: AC
  Administered 2018-06-28: 2 g via INTRAVENOUS
  Filled 2018-06-27 (×2): qty 100

## 2018-06-27 MED ORDER — SODIUM CHLORIDE 0.9 % IV SOLN
INTRAVENOUS | Status: DC
  Administered 2018-06-28 (×2): via INTRAVENOUS

## 2018-06-27 NOTE — Progress Notes (Signed)
Patient move to room 255.  Alert with no distress noted, daughter at bedside and new nurse Colletta Maryland in room.

## 2018-06-27 NOTE — Progress Notes (Signed)
Noted pink urine with purewick.  Assessed pt and changed bed.  Noted to be oozing from left groin site.  Urine yellow and clear.

## 2018-06-27 NOTE — Progress Notes (Signed)
Discussed with Kimberly,PA that left groin site is oozing under PAD and loose without seal.  PA assessed at bedside and removed PAD and instructed RN to monitor site and place guaze dressing over site.

## 2018-06-27 NOTE — Consult Note (Addendum)
ANTICOAGULATION CONSULT NOTE - Initial Consult  Pharmacy Consult for Heparin Dosing  Indication: Ischemic Leg.  Allergies  Allergen Reactions  . Lodine [Etodolac] Hives and Itching  . Methotrexate Derivatives Other (See Comments)    Flu like symptoms  . Nitrofuran Derivatives Nausea And Vomiting and Other (See Comments)    Made sicker   . Levaquin [Levofloxacin] Itching, Rash and Other (See Comments)    Muscle pain  . Norvasc [Amlodipine] Other (See Comments)    Unknown   . Pentasa [Mesalamine Er] Other (See Comments)    Unknown   . Sulfa Antibiotics Itching and Rash  . Vioxx [Rofecoxib] Other (See Comments)    Unknown    Patient Measurements: Height: 5' 4"  (162.6 cm) Weight: 128 lb 4.9 oz (58.2 kg) IBW/kg (Calculated) : 54.7 Vital Signs: Temp: 98.8 F (37.1 C) (12/12 0800) Temp Source: Axillary (12/12 0800) BP: 128/51 (12/12 1100) Pulse Rate: 108 (12/12 1100)  Labs: Recent Labs    06/26/18 1122 06/27/18 1104  HGB  --  8.3*  HCT  --  27.1*  PLT  --  267  CREATININE 0.57  --     Estimated Creatinine Clearance: 50.9 mL/min (by C-G formula based on SCr of 0.57 mg/dL).   Medical History: Past Medical History:  Diagnosis Date  . Anemia 12/2017   blood transfusion after amputation  . Anxiety   . Arthritis    rheumatoid  . Ataxia   . Chronic ulcer of right great toe ([place]) 2019   slowly healing  . COPD (chronic obstructive pulmonary disease) ([place])   . Coronary artery disease 2018   complete blockage in one vessel, 40 % in another, 25% in 3rd vessell  . Depression   . Diverticulosis   . Dysrhythmia    atrial fib  . Excessive falling   . GERD (gastroesophageal reflux disease)    ulcerative colitis  . Hypercholesteremia   . Hyperlipidemia   . Hypertension   . IBS (irritable bowel syndrome)   . MRSA (methicillin resistant staph aureus) culture positive 11/2017   patient unaware of this diagnosis, was never informed or treated  . Myocardial infarction  ([place]) 08/2017   had angio but no stents  . Osteoarthritis   . Psoriasis   . Raynaud's disease without gangrene   . Raynaud's disease without gangrene   . Shortness of breath dyspnea    with exertion  . 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 Patient S/P angioplasty right anterior tibial artery on 12/11.   12/12: Hgb: 8.3  Goal of Therapy:  Heparin level 0.3-0.7 units/ml Monitor platelets by anticoagulation protocol: Yes   Plan:  Give 2800 units bolus x 1 Start heparin infusion at 750 units/hr Check anti-Xa level in 8 hours and daily while on heparin Continue to monitor H&H and platelets  Pernell Dupre, PharmD, BCPS Clinical Pharmacist 06/27/2018 11:43 AM

## 2018-06-27 NOTE — Progress Notes (Signed)
Patient transferred to 2A 255 from CCU via bed.  Report called and given to this RN by [place] RN.  Patient oriented to floor and surroundings.  Call bell in reach. Telemetry placed and called in.  Family at bedside

## 2018-06-27 NOTE — Progress Notes (Signed)
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  Per report, patient had angiogram with L femoral access site.  Dressing D/I, area soft, non-tender.  Patient ankle to the toes are cold to touch, above ankle is warm.  Unable to find pedal pulse (per [place] in CCU, MD aware). Plan is for patient to have another angiogram tomorrow.  Noted necrotic area on R big toe.

## 2018-06-27 NOTE — Consult Note (Signed)
ANTICOAGULATION CONSULT NOTE - Initial Consult  Pharmacy Consult for Heparin Dosing  Indication: Ischemic Leg.  Allergies  Allergen Reactions  . Lodine [Etodolac] Hives and Itching  . Methotrexate Derivatives Other (See Comments)    Flu like symptoms  . Nitrofuran Derivatives Nausea And Vomiting and Other (See Comments)    Made sicker   . Levaquin [Levofloxacin] Itching, Rash and Other (See Comments)    Muscle pain  . Norvasc [Amlodipine] Other (See Comments)    Unknown   . Pentasa [Mesalamine Er] Other (See Comments)    Unknown   . Sulfa Antibiotics Itching and Rash  . Vioxx [Rofecoxib] Other (See Comments)    Unknown    Patient Measurements: Height: 5' 4"  (162.6 cm) Weight: 121 lb 14.6 oz (55.3 kg) IBW/kg (Calculated) : 54.7 Vital Signs: Temp: 98.8 F (37.1 C) (12/12 1945) Temp Source: Oral (12/12 1945) BP: 120/45 (12/12 1945) Pulse Rate: 78 (12/12 1945)  Labs: Recent Labs    06/26/18 1122 06/27/18 1104 06/27/18 2004  HGB  --  8.3*  --   HCT  --  27.1*  --   PLT  --  267  --   APTT  --  35  --   LABPROT  --  15.5*  --   INR  --  1.24  --   HEPARINUNFRC  --   --  0.28*  CREATININE 0.57  --   --     Estimated Creatinine Clearance: 50.9 mL/min (by C-G formula based on SCr of 0.57 mg/dL).   Medical History: Past Medical History:  Diagnosis Date  . Anemia 12/2017   blood transfusion after amputation  . Anxiety   . Arthritis    rheumatoid  . Ataxia   . Chronic ulcer of right great toe ([place]) 2019   slowly healing  . COPD (chronic obstructive pulmonary disease) ([place])   . Coronary artery disease 2018   complete blockage in one vessel, 40 % in another, 25% in 3rd vessell  . Depression   . Diverticulosis   . Dysrhythmia    atrial fib  . Excessive falling   . GERD (gastroesophageal reflux disease)    ulcerative colitis  . Hypercholesteremia   . Hyperlipidemia   . Hypertension   . IBS (irritable bowel syndrome)   . MRSA (methicillin resistant staph  aureus) culture positive 11/2017   patient unaware of this diagnosis, was never informed or treated  . Myocardial infarction ([place]) 08/2017   had angio but no stents  . Osteoarthritis   . Psoriasis   . Raynaud's disease without gangrene   . Raynaud's disease without gangrene   . Shortness of breath dyspnea    with exertion  . 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 Patient S/P angioplasty right anterior tibial artery on 12/11.   12/12: Hgb: 8.3 12/12 20:00 Heparin level resulted at 0.28, Heparin infusing at 750 units/hr.  Goal of Therapy:  Heparin level 0.3-0.7 units/ml Monitor platelets by anticoagulation protocol: Yes   Plan:  Will order Heparin 800 units IV bolus and increase drip to 850 units/hr. Recheck Heparin level in 8 hours. Daily CBC.  Paulina Fusi, PharmD, BCPS 06/27/2018 9:49 PM

## 2018-06-27 NOTE — Progress Notes (Signed)
[facility] Daily Progress Note   Subjective: 1 Day Post-Op: Introduction catheter into right lower extremity 3rd order catheter placement with additional third order, Contrast injection right lower extremity for distal runoff with additional 3rd order, CSI atherectomy with percutaneous transluminal angioplasty right anterior tibial artery, Percutaneous transluminal angioplasty right posterior tibial artery with Star close closure left common femoral arteriotomy  Patient with continued right foot pain. Aggrestat overnight not transitioning to heparin gtt.  Objective: Vitals:   06/27/18 0500 06/27/18 0600 06/27/18 0800 06/27/18 0900  BP: 120/72 (!) 165/70 115/60 131/69  Pulse: 93 93 90 75  Resp: 17 17 16  (!) 22  Temp:   98.8 F (37.1 C)   TempSrc:   Axillary   SpO2: 99% 90% 98% 100%  Weight:      Height:        Intake/Output Summary (Last 24 hours) at 06/27/2018 1043 Last data filed at 06/27/2018 0900 Gross per 24 hour  Intake 1420.29 ml  Output 350 ml  Net 1070.29 ml   Physical Exam: A&Ox3, NAD CV: RRR Pulmonary: CTA Bilaterally Abdomen: Soft, Nontender, Nondistended Vascular:  Right Lower Extremity: warm until approximately mid calf then leg becomes cooler. Hard to palpate pedal pulses.   Left groin: PAD removed.Large clot noted. No active bleeding. Access site redressed.    Laboratory: CBC    Component Value Date/Time   WBC 11.0 03/08/2018 0930   HGB 10.9 (L) 03/08/2018 0930   HGB 11.6 (L) 06/16/2013 1031   HCT 32.2 (L) 03/08/2018 0930   HCT 34.8 (L) 06/16/2013 1031   PLT 290 03/08/2018 0930   PLT 226 06/16/2013 1031   BMET    Component Value Date/Time   NA 140 03/08/2018 0930   NA 139 06/16/2013 1031   K 3.9 03/08/2018 0930   K 3.8 06/16/2013 1031   CL 106 03/08/2018 0930   CL 105 06/16/2013 1031   CO2 25 03/08/2018 0930   CO2 30 06/16/2013 1031   GLUCOSE 129 (H) 03/08/2018 0930   GLUCOSE 96 06/16/2013 1031   BUN 15 06/26/2018  1122   BUN 23 (H) 06/16/2013 1031   CREATININE 0.57 06/26/2018 1122   CREATININE 0.58 (L) 06/16/2013 1031   CALCIUM 9.2 03/08/2018 0930   CALCIUM 9.7 06/16/2013 1031   GFRNONAA >60 06/26/2018 1122   GFRNONAA >60 06/16/2013 1031   GFRAA >60 06/26/2018 1122   GFRAA >60 06/16/2013 1031   Assessment/Planning: The patient is a 77 year old female with severe PAD s/p RLE angiogram POD#1 1) Patient with continued pain to right foot. Unable to palpate pedal pulses. Cooler through the distal extremity. 2) Will plan on repeat RLE angiogram with possible intervention tomorrow with Dr. Delana Meyer 3) Aggrestat to stop. Heparin to start. 4) Pain control 5) Will pre-op for tomorrow  Discussed with Dr. Eber Hong Zerenity Bowron PA-C 06/27/2018 10:43 AM

## 2018-06-27 NOTE — Progress Notes (Signed)
Report called to Colletta [place], RN on 2A.

## 2018-06-27 NOTE — Anesthesia Preprocedure Evaluation (Addendum)
Anesthesia Evaluation  Patient identified by MRN, date of birth, ID band Patient awake    Reviewed: Allergy & Precautions, NPO status , Patient's Chart, lab work & pertinent test results  History of Anesthesia Complications Negative for: history of anesthetic complications  Airway Mallampati: III     Mouth opening: Limited Mouth Opening  Dental   Pulmonary neg sleep apnea, COPD,           Cardiovascular hypertension, Pt. on medications + Past MI  (-) Valvular Problems/Murmurs     Neuro/Psych neg Seizures Anxiety Depression    GI/Hepatic Neg liver ROS, GERD  Medicated and Controlled,  Endo/Other    Renal/GU negative Renal ROS     Musculoskeletal   Abdominal   Peds  Hematology  (+) anemia ,   Anesthesia Other Findings Past Medical History: 12/2017: Anemia     Comment:  blood transfusion after amputation No date: Anxiety No date: Arthritis     Comment:  rheumatoid No date: Ataxia 2019: Chronic ulcer of right great toe ([facility])     Comment:  slowly healing No date: COPD (chronic obstructive pulmonary disease) ([place]) 2018: Coronary artery disease     Comment:  complete blockage in one vessel, 40 % in another, 25% in              3rd vessell No date: Depression No date: Diverticulosis No date: Dysrhythmia     Comment:  atrial fib No date: Excessive falling No date: GERD (gastroesophageal reflux disease)     Comment:  ulcerative colitis No date: Hypercholesteremia No date: Hyperlipidemia No date: Hypertension No date: IBS (irritable bowel syndrome) 11/2017: MRSA (methicillin resistant staph aureus) culture positive     Comment:  patient unaware of this diagnosis, was never informed or              treated 08/2017: Myocardial infarction [facility])     Comment:  had angio but no stents No date: Osteoarthritis No date: Psoriasis No date: Raynaud's disease without gangrene No date: Raynaud's disease without  gangrene No date: Shortness of breath dyspnea     Comment:  with exertion 12/2017: Transfusion history     Comment:  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)   Reproductive/Obstetrics                            Anesthesia Physical Anesthesia Plan  ASA: III  Anesthesia Plan: General   Post-op Pain Management:    Induction: Intravenous  PONV Risk Score and Plan: 3 and Dexamethasone, Ondansetron and Midazolam  Airway Management Planned: Nasal Cannula  Additional Equipment:   Intra-op Plan:   Post-operative Plan:   Informed Consent: I have reviewed the patients History and Physical, chart, labs and discussed the procedure including the risks, benefits and alternatives for the proposed anesthesia with the patient or authorized representative who has indicated his/her understanding and acceptance.     Plan Discussed with:   Anesthesia Plan Comments:         Anesthesia Quick Evaluation

## 2018-06-27 NOTE — Progress Notes (Signed)
   06/27/18 0139  First Vascular Site Assessment  #1 - Location of Site Assessment Left femoral  #1 - Vascular Site Assessment Scale Level 1  #1 - Air in PAD 30  #1 -  Deflate/Inflate Deflate  RLE Neurovascular Assessment  R Femoral Pulse +1  R Posterior Tibial Pulse Absent  R Dorsalis Pedis Pulse Absent  R Popliteal Pulse Doppler   Pt continues to have small amount of oozing since post-procedure from left femoral site.  Unable to deflate PAD to the order 64m.  If deflated/inflated to 371m oozing is at a minimum.  Notified Dr ScRonalee Belts Stated to continue to inflate to 3023m  He also stated for the Aggrastat gtt to continue as ordered.

## 2018-06-28 ENCOUNTER — Encounter
Admission: AD | Disposition: A | Discharge status: Skilled Nursing Facility | Payer: Self-pay | Source: Home / Self Care | Attending: Vascular Surgery

## 2018-06-28 ENCOUNTER — Inpatient Hospital Stay: Payer: Medicare HMO | Admitting: Anesthesiology

## 2018-06-28 DIAGNOSIS — I70221 Atherosclerosis of native arteries of extremities with rest pain, right leg: Secondary | ICD-10-CM

## 2018-06-28 HISTORY — PX: LOWER EXTREMITY ANGIOGRAPHY: CATH118251

## 2018-06-28 LAB — BASIC METABOLIC PANEL
Anion gap: 8 (ref 5–15)
BUN: 10 mg/dL (ref 8–23)
CALCIUM: 8.9 mg/dL (ref 8.9–10.3)
CO2: 26 mmol/L (ref 22–32)
Chloride: 104 mmol/L (ref 98–111)
Creatinine, Ser: 0.48 mg/dL (ref 0.44–1.00)
GFR calc Af Amer: >60 mL/min (ref >60)
GFR calc non Af Amer: >60 mL/min (ref >60)
Glucose, Bld: 151 mg/dL — ABNORMAL HIGH (ref 70–99)
Potassium: 3.4 mmol/L — ABNORMAL LOW (ref 3.5–5.1)
Sodium: 138 mmol/L (ref 135–145)

## 2018-06-28 LAB — CBC
HEMATOCRIT: 25.7 % — AB (ref 36.0–46.0)
Hemoglobin: 7.8 g/dL — ABNORMAL LOW (ref 12.0–15.0)
MCH: 31.3 pg (ref 26.0–34.0)
MCHC: 30.4 g/dL (ref 30.0–36.0)
MCV: 103.2 fL — ABNORMAL HIGH (ref 80.0–100.0)
Platelets: 226 10*3/uL (ref 150–400)
RBC: 2.49 MIL/uL — ABNORMAL LOW (ref 3.87–5.11)
RDW: 13.6 % (ref 11.5–15.5)
WBC: 15.1 10*3/uL — ABNORMAL HIGH (ref 4.0–10.5)
nRBC: 0 % (ref 0.0–0.2)

## 2018-06-28 LAB — GLUCOSE, CAPILLARY: Glucose-Capillary: 114 mg/dL — ABNORMAL HIGH (ref 70–99)

## 2018-06-28 LAB — HEPARIN LEVEL (UNFRACTIONATED)
HEPARIN UNFRACTIONATED: 0.3 [IU]/mL (ref 0.30–0.70)
Heparin Unfractionated: 0.38 IU/mL (ref 0.30–0.70)

## 2018-06-28 LAB — MAGNESIUM: Magnesium: 1.7 mg/dL (ref 1.7–2.4)

## 2018-06-28 SURGERY — LOWER EXTREMITY ANGIOGRAPHY
Anesthesia: General | Laterality: Right

## 2018-06-28 MED ORDER — HEPARIN SODIUM (PORCINE) 1000 UNIT/ML IJ SOLN
INTRAMUSCULAR | Status: AC
  Filled 2018-06-28: qty 1

## 2018-06-28 MED ORDER — PHENYLEPHRINE HCL 10 MG/ML IJ SOLN
INTRAMUSCULAR | Status: DC | PRN
  Administered 2018-06-28 (×2): 50 ug via INTRAVENOUS

## 2018-06-28 MED ORDER — HEPARIN (PORCINE) IN NACL 1000-0.9 UT/500ML-% IV SOLN
INTRAVENOUS | Status: AC
  Filled 2018-06-28: qty 1500

## 2018-06-28 MED ORDER — MAGNESIUM SULFATE 2 GM/50ML IV SOLN
2.0000 g | Freq: Once | INTRAVENOUS | Status: AC
  Administered 2018-06-28: 2 g via INTRAVENOUS
  Filled 2018-06-28 (×2): qty 50

## 2018-06-28 MED ORDER — SODIUM CHLORIDE 0.9 % IV SOLN
INTRAVENOUS | Status: DC
  Administered 2018-07-01 – 2018-07-02 (×3): via INTRAVENOUS

## 2018-06-28 MED ORDER — DEXAMETHASONE SODIUM PHOSPHATE 10 MG/ML IJ SOLN
INTRAMUSCULAR | Status: DC | PRN
  Administered 2018-06-28: 5 mg via INTRAVENOUS

## 2018-06-28 MED ORDER — SODIUM CHLORIDE 0.9 % IV SOLN
INTRAVENOUS | Status: AC
  Administered 2018-06-28: 16:00:00 via INTRAVENOUS

## 2018-06-28 MED ORDER — SODIUM CHLORIDE 0.9 % IV SOLN: 250.0000 mL | INTRAVENOUS | Status: DC | PRN

## 2018-06-28 MED ORDER — SODIUM CHLORIDE 0.9% FLUSH: 3.0000 mL | Freq: Two times a day (BID) | INTRAVENOUS | Status: DC

## 2018-06-28 MED ORDER — LIDOCAINE HCL (CARDIAC) PF 100 MG/5ML IV SOSY
PREFILLED_SYRINGE | INTRAVENOUS | Status: DC | PRN
  Administered 2018-06-28: 40 mg via INTRATRACHEAL

## 2018-06-28 MED ORDER — CEFAZOLIN SODIUM-DEXTROSE 2-4 GM/100ML-% IV SOLN
2.0000 g | INTRAVENOUS | Status: AC
  Administered 2018-07-01: 2 g via INTRAVENOUS
  Filled 2018-06-28: qty 100

## 2018-06-28 MED ORDER — IOPAMIDOL (ISOVUE-300) INJECTION 61%
INTRAVENOUS | Status: DC | PRN
  Administered 2018-06-28: 60 mL via INTRAVENOUS

## 2018-06-28 MED ORDER — SODIUM CHLORIDE 0.9% FLUSH: 3.0000 mL | INTRAVENOUS | Status: DC | PRN

## 2018-06-28 MED ORDER — FENTANYL CITRATE (PF) 100 MCG/2ML IJ SOLN
INTRAMUSCULAR | Status: AC
  Administered 2018-06-28: 25 ug via INTRAVENOUS
  Filled 2018-06-28: qty 2

## 2018-06-28 MED ORDER — FENTANYL CITRATE (PF) 100 MCG/2ML IJ SOLN
25.0000 ug | INTRAMUSCULAR | Status: DC | PRN
  Administered 2018-06-28 (×2): 25 ug via INTRAVENOUS

## 2018-06-28 MED ORDER — POTASSIUM CHLORIDE CRYS ER 20 MEQ PO TBCR
20.0000 meq | EXTENDED_RELEASE_TABLET | Freq: Two times a day (BID) | ORAL | Status: AC
  Administered 2018-06-28 (×2): 20 meq via ORAL
  Filled 2018-06-28 (×2): qty 1

## 2018-06-28 MED ORDER — PROPOFOL 500 MG/50ML IV EMUL
INTRAVENOUS | Status: DC | PRN
  Administered 2018-06-28: 50 ug/kg/min via INTRAVENOUS

## 2018-06-28 MED ORDER — PROPOFOL 500 MG/50ML IV EMUL
INTRAVENOUS | Status: AC
  Filled 2018-06-28: qty 50

## 2018-06-28 MED ORDER — ONDANSETRON HCL 4 MG/2ML IJ SOLN
INTRAMUSCULAR | Status: AC
  Filled 2018-06-28: qty 2

## 2018-06-28 MED ORDER — PROPOFOL 10 MG/ML IV BOLUS
INTRAVENOUS | Status: AC
  Filled 2018-06-28: qty 40

## 2018-06-28 MED ORDER — HEPARIN (PORCINE) 25000 UT/250ML-% IV SOLN
950.0000 [IU]/h | INTRAVENOUS | Status: DC
  Administered 2018-06-28: 850 [IU]/h via INTRAVENOUS
  Administered 2018-06-29 – 2018-07-01 (×2): 950 [IU]/h via INTRAVENOUS
  Filled 2018-06-28 (×2): qty 250

## 2018-06-28 MED ORDER — ONDANSETRON HCL 4 MG/2ML IJ SOLN: 4.0000 mg | Freq: Once | INTRAMUSCULAR | Status: DC | PRN

## 2018-06-28 MED ORDER — LIDOCAINE HCL (PF) 1 % IJ SOLN
INTRAMUSCULAR | Status: AC
  Filled 2018-06-28: qty 30

## 2018-06-28 MED ORDER — MIDAZOLAM HCL 2 MG/2ML IJ SOLN
INTRAMUSCULAR | Status: AC
  Filled 2018-06-28: qty 2

## 2018-06-28 MED ORDER — HEPARIN SODIUM (PORCINE) 1000 UNIT/ML IJ SOLN
INTRAMUSCULAR | Status: DC | PRN
  Administered 2018-06-28: 1000 [IU] via INTRAVENOUS
  Administered 2018-06-28: 4000 [IU] via INTRAVENOUS

## 2018-06-28 MED ORDER — DEXAMETHASONE SODIUM PHOSPHATE 10 MG/ML IJ SOLN
INTRAMUSCULAR | Status: AC
  Filled 2018-06-28: qty 1

## 2018-06-28 MED ORDER — MIDAZOLAM HCL 2 MG/2ML IJ SOLN
INTRAMUSCULAR | Status: DC | PRN
  Administered 2018-06-28: 1 mg via INTRAVENOUS
  Administered 2018-06-28 (×2): 0.5 mg via INTRAVENOUS

## 2018-06-28 SURGICAL SUPPLY — 31 items
BALLN COYOTE ES OTW 2X40X144 (BALLOONS) ×3
BALLN MINITREK RX 2.0X20 (BALLOONS) ×3
BALLN STERLING OTW 2X40X150 (BALLOONS) ×3
BALLN ULTRSCOR 014 2.5X200X150 (BALLOONS) ×3
BALLN ULTRVRSE 2X40X150 (BALLOONS) ×3
CATH BEACON 5 .035 65 KMP TIP (CATHETERS) ×3
CATH CROSS S6 106CM (CATHETERS) ×3
CATH CROSSER 14S OTW 106CM (CATHETERS) ×3
CATH SEEKER .018X150 (CATHETERS) ×3
CATH SEEKER 014X150 (CATHETERS) ×3
CATH USHER TPER 83CM (CATHETERS) ×3
COVER PROBE U/S 5X48 (MISCELLANEOUS) ×3
DEVICE PRESTO INFLATION (MISCELLANEOUS) ×3
DEVICE SAFEGUARD 24CM (GAUZE/BANDAGES/DRESSINGS) ×3
DEVICE STARCLOSE SE CLOSURE (Vascular Products) ×3 IMPLANT
GLIDECATH ANGL TAPER 5F (CATHETERS) ×3
GLIDESHEATH SLEND A-KIT 6F 22G (SHEATH) ×3
GUIDEWIRE ADV .018X180CM (WIRE) ×3
GUIDEWIRE PFTE-COATED .018X300 (WIRE) ×3
GUIDEWIRE STR TIP .014X300X8 (WIRE) ×6
GUIDEWIRE SUPER STIFF .035X180 (WIRE) ×3
KIT FLOWMATE PROCEDURAL (KITS) ×3
MICROGUIDE FINECROSS 130 CM (MICROCATHETER) ×3
NEEDLE ENTRY 21GA 7CM ECHOTIP (NEEDLE) ×3
PACK ANGIOGRAPHY (CUSTOM PROCEDURE TRAY) ×3
SET INTRO CAPELLA COAXIAL (SET/KITS/TRAYS/PACK) ×3
SHEATH BRITE TIP 6FR X 23 (SHEATH) ×3
SHIELD RADPAD SCOOP 12X17 (MISCELLANEOUS) ×3
TORQUE DEVICE .014-.018 (MISCELLANEOUS) ×3
TOWEL OR 17X26 4PK STRL BLUE (TOWEL DISPOSABLE) ×3
WIRE SPARTACORE .014X300CM (WIRE) ×3

## 2018-06-28 NOTE — Transfer of Care (Signed)
Immediate Anesthesia Transfer of Care Note  Patient: Melissa [name]  Procedure(s) Performed: Lower Extremity Angiography (Right )  Patient Location: PACU  Anesthesia Type:General  Level of Consciousness: awake  Airway & Oxygen Therapy: Patient Spontanous Breathing and Patient connected to face mask oxygen  Post-op Assessment: Report given to RN and Post -op Vital signs reviewed and stable  Post vital signs: stable  Last Vitals:  Vitals Value Taken Time  BP 132/61 06/28/2018  1:49 PM  Temp    Pulse 93 06/28/2018  1:56 PM  Resp 16 06/28/2018  1:56 PM  SpO2 100 % 06/28/2018  1:56 PM  Vitals shown include unvalidated device data.  Last Pain:  Vitals:   06/28/18 0937  TempSrc: Oral  PainSc: 6       Patients Stated Pain Goal: 3 (01/26/51 4799)  Complications: No apparent anesthesia complications

## 2018-06-28 NOTE — Consult Note (Signed)
ANTICOAGULATION CONSULT NOTE -  Pharmacy Consult for Heparin Dosing  Indication: Ischemic Leg.  Allergies  Allergen Reactions  . Lodine [Etodolac] Hives and Itching  . Methotrexate Derivatives Other (See Comments)    Flu like symptoms  . Nitrofuran Derivatives Nausea And Vomiting and Other (See Comments)    Made sicker   . Levaquin [Levofloxacin] Itching, Rash and Other (See Comments)    Muscle pain  . Norvasc [Amlodipine] Other (See Comments)    Unknown   . Pentasa [Mesalamine Er] Other (See Comments)    Unknown   . Sulfa Antibiotics Itching and Rash  . Vioxx [Rofecoxib] Other (See Comments)    Unknown    Patient Measurements: Height: 5' 4"  (162.6 cm) Weight: 121 lb 14.6 oz (55.3 kg) IBW/kg (Calculated) : 54.7 Vital Signs: Temp: 98.6 F (37 C) (12/13 1941) Temp Source: Oral (12/13 1941) BP: 135/75 (12/13 1941) Pulse Rate: 89 (12/13 1941)  Labs: Recent Labs    06/26/18 1122 06/27/18 1104 06/27/18 2004 06/28/18 0550 06/28/18 2231  HGB  --  8.3*  --  7.8*  --   HCT  --  27.1*  --  25.7*  --   PLT  --  267  --  226  --   APTT  --  35  --   --   --   LABPROT  --  15.5*  --   --   --   INR  --  1.24  --   --   --   HEPARINUNFRC  --   --  0.28* 0.38 0.30  CREATININE 0.57  --   --  0.48  --     Estimated Creatinine Clearance: 50.9 mL/min (by C-G formula based on SCr of 0.48 mg/dL).   Medical History: Past Medical History:  Diagnosis Date  . Anemia 12/2017   blood transfusion after amputation  . Anxiety   . Arthritis    rheumatoid  . Ataxia   . Chronic ulcer of right great toe ([place]) 2019   slowly healing  . COPD (chronic obstructive pulmonary disease) ([place])   . Coronary artery disease 2018   complete blockage in one vessel, 40 % in another, 25% in 3rd vessell  . Depression   . Diverticulosis   . Dysrhythmia    atrial fib  . Excessive falling   . GERD (gastroesophageal reflux disease)    ulcerative colitis  . Hypercholesteremia   . Hyperlipidemia    . Hypertension   . IBS (irritable bowel syndrome)   . MRSA (methicillin resistant staph aureus) culture positive 11/2017   patient unaware of this diagnosis, was never informed or treated  . Myocardial infarction ([place]) 08/2017   had angio but no stents  . Osteoarthritis   . Psoriasis   . Raynaud's disease without gangrene   . Raynaud's disease without gangrene   . Shortness of breath dyspnea    with exertion  . 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 Patient S/P angioplasty right anterior tibial artery on 12/11.   12/12: Hgb: 8.3  Goal of Therapy:  Heparin level 0.3-0.7 units/ml Monitor platelets by anticoagulation protocol: Yes   Plan:  Give 2800 units bolus x 1 Start heparin infusion at 750 units/hr Check anti-Xa level in 8 hours and daily while on heparin Continue to monitor H&H and platelets  12/13:  Patient went to angiogram. Heparin  to be restarted in 1 hour with no bolus per consult. Will restart 1445 at 850 units/hr as previous order. Check HL in 8 hours.  12/13 PM heparin level 0.30. Continue current regimen. Recheck heparin level and CBC with tomorrow AM labs.  Eloise Harman, PharmD, BCPS Clinical Pharmacist 06/28/2018 11:14 PM

## 2018-06-28 NOTE — Progress Notes (Signed)
Report given to specialty group, terry RN.  Familiar with patient.  Patient transferred via bed with family at bedside.  Patient R foot cold to touch, no pedal pulse noted.  L femoral site continues to slightly ooze (reported off to [place]).  Patient noted to have R great toe with necrotic area as noted yesterday.

## 2018-06-28 NOTE — Progress Notes (Signed)
[facility] Daily Progress Note   Vascular Surgery Communication Note  Plan: Right Below The Knee Amputation on Monday with Dr. Delana Meyer Will pre-op.  Discussed with Dr. Eber Hong Shanie Mauzy PA-C 06/28/2018 2:22 PM

## 2018-06-28 NOTE — Progress Notes (Signed)
Patient returned from PACU s/p vascular procedure.  Pressure device on R femoral site in groin.  No new bleeding of site noted.  Patient to lay flat until 1600 and then able to lift head of bed.  Patient placed on 2L [place] at this time for comfort.  Daughter at bedside.

## 2018-06-28 NOTE — Anesthesia Postprocedure Evaluation (Signed)
Anesthesia Post Note  Patient: Melissa [name]  Procedure(s) Performed: Lower Extremity Angiography (Right )  Patient location during evaluation: PACU Anesthesia Type: General Level of consciousness: awake and alert Pain management: pain level controlled Vital Signs Assessment: post-procedure vital signs reviewed and stable Respiratory status: spontaneous breathing and respiratory function stable Cardiovascular status: stable Anesthetic complications: no     Last Vitals:  Vitals:   06/28/18 1515 06/28/18 1520  BP: 129/67   Pulse: 94   Resp:    Temp:    SpO2: (!) 87% 93%    Last Pain:  Vitals:   06/28/18 1449  TempSrc:   PainSc: Asleep                 Jos Cygan K

## 2018-06-28 NOTE — Op Note (Signed)
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 Introduction catheter into right lower extremity 3rd order catheter placement with additional third order 2. Contrast injection right lower extremity for distal runoff with additional 3rd order  3. Percutaneous transluminal angioplasty right anterior tibial              4. Percutaneous transluminal angioplasty associated with crosser atherectomy right posterior tibial unsuccessful  5. Star close closure right common femoral arteriotomy  Anesthesia: MAC anesthesia Sheath: 6 French 23 cm Pinnacle antegrade right common femoral  Contrast: 80 cc  Fluoroscopy Time: 28.3 minutes  Indications: Melissa [name] presents with increasing pain of the right lower extremity.  Her intervention from Wednesday appears to have occluded on the patient continues to have limb threatening ischemia. The risks and benefits are reviewed all questions answered patient agrees to proceed.  Procedure:Melissa [name] is a 77 y.o. y.o. female who was identified and appropriate procedural time out was performed. The patient was then placed supine on the table and prepped and draped in the usual sterile fashion.   Ultrasound was placed in the sterile sleeve and the right groin was evaluated the right common femoral artery was echolucent and pulsatile indicating patency. Image was recorded for the permanent record and under real-time visualization a microneedle was inserted into the common femoral artery followed by the microwire and then the micro-sheath. A J-wire was then advanced through the micro-sheath and a 6 French  sheath was then inserted over a J-wire.   4000 units of heparin was then given and allowed to circulate and a 23 cm Pinnacle sheath was advanced into the superficial femoral artery  Kumpe catheter and advantage Glidewire were then negotiated down into the posterior tibial. Hand injection contrast demonstrated the tibial anatomy in detail.  Occlusion at multiple levels of the posterior tibial was noted.  The crosser S6 device and later the crosser 14 S device were both utilized but could not achieve successful reentry.  Therefore I turned my attention to the anterior tibial.  The detector was then repositioned and the distal popliteal was re-imaged.    The advantage wire and Kumpe catheter were negotiated into the anterior tibial artery.  Hand-injection contrast once again showed occlusion from the midportion down to the foot.  Glidewire and catheter were then negotiated without much difficulty all the way down to the dorsalis pedis and then out to the level of the pedal arch.  Hand-injection contrast demonstrated patency of the pedal arch and several digital arteries.  As the catheter was pulled back there is patency of the dorsalis pedis and distal anterior tibial.  However with the catheter then pulled back into the proximal one third there was again an abrupt cut off.  Given this finding wire was reintroduced and beginning with a 2 mm coronary balloon the dorsalis pedis was angioplastied.  I then used a 2.5 mm x 15 cm ultra score balloon to angioplasty the distal and mid one third segments.  With the catheter directly injecting into the anterior tibial there was patency however with the catheter at the level of the popliteal there was no distal flow.  These were the exact same findings noted at the end of Wednesdays angiogram.  I believe that the distal small vessel disease is so extensive  that there is minimal forward flow the anterior tibial and dorsalis pedis are patent without evidence for  hemodynamically significant stenosis.  I therefore elected to terminate the procedure.    After review of these images the sheath is pulled into the right common femoral and and oblique of the common femoral is obtained and a Star close device deployed. There no immediate Complications.  Findings:   Initial views once again demonstrate patency of the common femoral SFA and popliteal.  Trifurcation is heavily diseased but patent.  The posterior tibial occludes toward the ankle and there is occlusion of the anterior tibial at the level of the ankle peroneal is diffusely diseased and occludes distally as well.  Attempts to cross her atherectomy of the posterior tibial are not successful.  Angioplasty of the anterior tibial is successful however in spite of successful angioplasty there is minimal forward flow and I do not believe that this reconstruction will stay patent long-term given the similar findings from Wednesdays angiogram.  Summary: Unsuccessful recanalization right lower extremity for limb salvage   Disposition: Patient was taken to the recovery room in stable condition having tolerated the procedure well.  Melissa [name] 06/28/2018,1:48 PM

## 2018-06-28 NOTE — Consult Note (Signed)
ANTICOAGULATION CONSULT NOTE - Initial Consult  Pharmacy Consult for Heparin Dosing  Indication: Ischemic Leg.  Allergies  Allergen Reactions  . Lodine [Etodolac] Hives and Itching  . Methotrexate Derivatives Other (See Comments)    Flu like symptoms  . Nitrofuran Derivatives Nausea And Vomiting and Other (See Comments)    Made sicker   . Levaquin [Levofloxacin] Itching, Rash and Other (See Comments)    Muscle pain  . Norvasc [Amlodipine] Other (See Comments)    Unknown   . Pentasa [Mesalamine Er] Other (See Comments)    Unknown   . Sulfa Antibiotics Itching and Rash  . Vioxx [Rofecoxib] Other (See Comments)    Unknown    Patient Measurements: Height: 5' 4"  (162.6 cm) Weight: 121 lb 14.6 oz (55.3 kg) IBW/kg (Calculated) : 54.7 Vital Signs: Temp: 98.6 F (37 C) (12/13 0506) Temp Source: Oral (12/13 0506) BP: 118/68 (12/13 0506) Pulse Rate: 100 (12/13 0506)  Labs: Recent Labs    06/26/18 1122 06/27/18 1104 06/27/18 2004 06/28/18 0550  HGB  --  8.3*  --  7.8*  HCT  --  27.1*  --  25.7*  PLT  --  267  --  226  APTT  --  35  --   --   LABPROT  --  15.5*  --   --   INR  --  1.24  --   --   HEPARINUNFRC  --   --  0.28* 0.38  CREATININE 0.57  --   --  0.48    Estimated Creatinine Clearance: 50.9 mL/min (by C-G formula based on SCr of 0.48 mg/dL).   Medical History: Past Medical History:  Diagnosis Date  . Anemia 12/2017   blood transfusion after amputation  . Anxiety   . Arthritis    rheumatoid  . Ataxia   . Chronic ulcer of right great toe ([place]) 2019   slowly healing  . COPD (chronic obstructive pulmonary disease) ([place])   . Coronary artery disease 2018   complete blockage in one vessel, 40 % in another, 25% in 3rd vessell  . Depression   . Diverticulosis   . Dysrhythmia    atrial fib  . Excessive falling   . GERD (gastroesophageal reflux disease)    ulcerative colitis  . Hypercholesteremia   . Hyperlipidemia   . Hypertension   . IBS (irritable  bowel syndrome)   . MRSA (methicillin resistant staph aureus) culture positive 11/2017   patient unaware of this diagnosis, was never informed or treated  . Myocardial infarction ([place]) 08/2017   had angio but no stents  . Osteoarthritis   . Psoriasis   . Raynaud's disease without gangrene   . Raynaud's disease without gangrene   . Shortness of breath dyspnea    with exertion  . 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 Patient S/P angioplasty right anterior tibial artery on 12/11.   12/12: Hgb: 8.3 12/12 20:00 Heparin level resulted at 0.28, Heparin infusing at 750 units/hr.  Goal of Therapy:  Heparin level 0.3-0.7 units/ml Monitor platelets by anticoagulation protocol: Yes   Plan:  Will order Heparin 800 units IV bolus and increase drip to 850 units/hr. Recheck Heparin level in 8 hours. Daily CBC.  12/13 AM heparin level 0.38. Continue current regimen. Recheck heparin level in 8 hours to confirm.  Sim Boast, PharmD, BCPS  06/28/18 7:04 AM

## 2018-06-28 NOTE — Anesthesia Post-op Follow-up Note (Signed)
Anesthesia QCDR form completed.        

## 2018-06-28 NOTE — H&P (Signed)
[facility] History & Physical Update  The patient was interviewed and re-examined.  The patient's previous History and Physical has been reviewed and is unchanged.  There is no change in the plan of care. We plan to proceed with the scheduled procedure.  [name], MD  06/28/2018, 10:21 AM

## 2018-06-28 NOTE — Consult Note (Signed)
ANTICOAGULATION CONSULT NOTE -  Pharmacy Consult for Heparin Dosing  Indication: Ischemic Leg.  Allergies  Allergen Reactions  . Lodine [Etodolac] Hives and Itching  . Methotrexate Derivatives Other (See Comments)    Flu like symptoms  . Nitrofuran Derivatives Nausea And Vomiting and Other (See Comments)    Made sicker   . Levaquin [Levofloxacin] Itching, Rash and Other (See Comments)    Muscle pain  . Norvasc [Amlodipine] Other (See Comments)    Unknown   . Pentasa [Mesalamine Er] Other (See Comments)    Unknown   . Sulfa Antibiotics Itching and Rash  . Vioxx [Rofecoxib] Other (See Comments)    Unknown    Patient Measurements: Height: 5' 4"  (162.6 cm) Weight: 121 lb 14.6 oz (55.3 kg) IBW/kg (Calculated) : 54.7 Vital Signs: Temp: 97.9 F (36.6 C) (12/13 1349) Temp Source: Oral (12/13 0937) BP: 132/61 (12/13 1349) Pulse Rate: 87 (12/13 1349)  Labs: Recent Labs    06/26/18 1122 06/27/18 1104 06/27/18 2004 06/28/18 0550  HGB  --  8.3*  --  7.8*  HCT  --  27.1*  --  25.7*  PLT  --  267  --  226  APTT  --  35  --   --   LABPROT  --  15.5*  --   --   INR  --  1.24  --   --   HEPARINUNFRC  --   --  0.28* 0.38  CREATININE 0.57  --   --  0.48    Estimated Creatinine Clearance: 50.9 mL/min (by C-G formula based on SCr of 0.48 mg/dL).   Medical History: Past Medical History:  Diagnosis Date  . Anemia 12/2017   blood transfusion after amputation  . Anxiety   . Arthritis    rheumatoid  . Ataxia   . Chronic ulcer of right great toe ([place]) 2019   slowly healing  . COPD (chronic obstructive pulmonary disease) ([place])   . Coronary artery disease 2018   complete blockage in one vessel, 40 % in another, 25% in 3rd vessell  . Depression   . Diverticulosis   . Dysrhythmia    atrial fib  . Excessive falling   . GERD (gastroesophageal reflux disease)    ulcerative colitis  . Hypercholesteremia   . Hyperlipidemia   . Hypertension   . IBS (irritable bowel syndrome)    . MRSA (methicillin resistant staph aureus) culture positive 11/2017   patient unaware of this diagnosis, was never informed or treated  . Myocardial infarction ([place]) 08/2017   had angio but no stents  . Osteoarthritis   . Psoriasis   . Raynaud's disease without gangrene   . Raynaud's disease without gangrene   . Shortness of breath dyspnea    with exertion  . 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 Patient S/P angioplasty right anterior tibial artery on 12/11.   12/12: Hgb: 8.3  Goal of Therapy:  Heparin level 0.3-0.7 units/ml Monitor platelets by anticoagulation protocol: Yes   Plan:  Give 2800 units bolus x 1 Start heparin infusion at 750 units/hr Check anti-Xa level in 8 hours and daily while on heparin Continue to monitor H&H and platelets  12/13:  Patient went to angiogram. Heparin to be restarted in 1 hour with no bolus per consult. Will restart 1445 at 850 units/hr as previous order. Check HL in 8  hours.  Noralee Space, PharmD, BCPS Clinical Pharmacist 06/28/2018 2:13 PM

## 2018-06-29 LAB — BASIC METABOLIC PANEL
ANION GAP: 8 (ref 5–15)
BUN: 10 mg/dL (ref 8–23)
CO2: 24 mmol/L (ref 22–32)
Calcium: 8.8 mg/dL — ABNORMAL LOW (ref 8.9–10.3)
Chloride: 104 mmol/L (ref 98–111)
Creatinine, Ser: 0.45 mg/dL (ref 0.44–1.00)
GFR calc non Af Amer: >60 mL/min (ref >60)
Glucose, Bld: 117 mg/dL — ABNORMAL HIGH (ref 70–99)
Potassium: 4.6 mmol/L (ref 3.5–5.1)
SODIUM: 136 mmol/L (ref 135–145)

## 2018-06-29 LAB — GLUCOSE, CAPILLARY: Glucose-Capillary: 98 mg/dL (ref 70–99)

## 2018-06-29 LAB — CBC
HCT: 23.8 % — ABNORMAL LOW (ref 36.0–46.0)
Hemoglobin: 7.5 g/dL — ABNORMAL LOW (ref 12.0–15.0)
MCH: 32.1 pg (ref 26.0–34.0)
MCHC: 31.5 g/dL (ref 30.0–36.0)
MCV: 101.7 fL — ABNORMAL HIGH (ref 80.0–100.0)
Platelets: 260 10*3/uL (ref 150–400)
RBC: 2.34 MIL/uL — ABNORMAL LOW (ref 3.87–5.11)
RDW: 13.6 % (ref 11.5–15.5)
WBC: 18.1 10*3/uL — ABNORMAL HIGH (ref 4.0–10.5)
nRBC: 0 % (ref 0.0–0.2)

## 2018-06-29 LAB — HEPARIN LEVEL (UNFRACTIONATED)
HEPARIN UNFRACTIONATED: 0.42 [IU]/mL (ref 0.30–0.70)
Heparin Unfractionated: 0.27 IU/mL — ABNORMAL LOW (ref 0.30–0.70)
Heparin Unfractionated: 0.51 IU/mL (ref 0.30–0.70)

## 2018-06-29 LAB — MAGNESIUM: Magnesium: 2.1 mg/dL (ref 1.7–2.4)

## 2018-06-29 MED ORDER — HEPARIN BOLUS VIA INFUSION
800.0000 [IU] | Freq: Once | INTRAVENOUS | Status: AC
  Administered 2018-06-29: 800 [IU] via INTRAVENOUS
  Filled 2018-06-29: qty 800

## 2018-06-29 NOTE — Consult Note (Signed)
ANTICOAGULATION CONSULT NOTE -  Pharmacy Consult for Heparin Dosing  Indication: Ischemic Leg.  Allergies  Allergen Reactions  . Lodine [Etodolac] Hives and Itching  . Methotrexate Derivatives Other (See Comments)    Flu like symptoms  . Nitrofuran Derivatives Nausea And Vomiting and Other (See Comments)    Made sicker   . Levaquin [Levofloxacin] Itching, Rash and Other (See Comments)    Muscle pain  . Norvasc [Amlodipine] Other (See Comments)    Unknown   . Pentasa [Mesalamine Er] Other (See Comments)    Unknown   . Sulfa Antibiotics Itching and Rash  . Vioxx [Rofecoxib] Other (See Comments)    Unknown    Patient Measurements: Height: 5' 4"  (162.6 cm) Weight: 121 lb 14.6 oz (55.3 kg) IBW/kg (Calculated) : 54.7 Vital Signs: Temp: 98.7 F (37.1 C) (12/14 1928) Temp Source: Oral (12/14 1928) BP: 121/58 (12/14 1928) Pulse Rate: 67 (12/14 1928)  Labs: Recent Labs    06/27/18 1104  06/28/18 0550  06/29/18 0418 06/29/18 1402 06/29/18 2140  HGB 8.3*  --  7.8*  --  7.5*  --   --   HCT 27.1*  --  25.7*  --  23.8*  --   --   PLT 267  --  226  --  260  --   --   APTT 35  --   --   --   --   --   --   LABPROT 15.5*  --   --   --   --   --   --   INR 1.24  --   --   --   --   --   --   HEPARINUNFRC  --    < > 0.38   < > 0.27* 0.42 0.51  CREATININE  --   --  0.48  --  0.45  --   --    < > = values in this interval not displayed.    Estimated Creatinine Clearance: 50.9 mL/min (by C-G formula based on SCr of 0.45 mg/dL).   Medical History: Past Medical History:  Diagnosis Date  . Anemia 12/2017   blood transfusion after amputation  . Anxiety   . Arthritis    rheumatoid  . Ataxia   . Chronic ulcer of right great toe ([place]) 2019   slowly healing  . COPD (chronic obstructive pulmonary disease) ([place])   . Coronary artery disease 2018   complete blockage in one vessel, 40 % in another, 25% in 3rd vessell  . Depression   . Diverticulosis   . Dysrhythmia    atrial fib   . Excessive falling   . GERD (gastroesophageal reflux disease)    ulcerative colitis  . Hypercholesteremia   . Hyperlipidemia   . Hypertension   . IBS (irritable bowel syndrome)   . MRSA (methicillin resistant staph aureus) culture positive 11/2017   patient unaware of this diagnosis, was never informed or treated  . Myocardial infarction ([place]) 08/2017   had angio but no stents  . Osteoarthritis   . Psoriasis   . Raynaud's disease without gangrene   . Raynaud's disease without gangrene   . Shortness of breath dyspnea    with exertion  . 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 Patient S/P angioplasty right anterior tibial artery on 12/11.   12/12: Hgb: 8.3  Goal of Therapy:  Heparin level 0.3-0.7 units/ml Monitor platelets by anticoagulation protocol: Yes   Plan:  Give 2800 units bolus x 1 Start heparin infusion at 750 units/hr Check anti-Xa level in 8 hours and daily while on heparin Continue to monitor H&H and platelets  12/13:  Patient went to angiogram. Heparin to be restarted in 1 hour with no bolus per consult. Will restart 1445 at 850 units/hr as previous order. Check HL in 8 hours.  12/14 0400 heparin level 0.27. 800 unit bolus and increase rate to 950 units/hr. Recheck in 8 hours.  12/14 14:00  HL = 0.42.  Continue current drip rate. Recheck tonight at 22:00.  12/14 PM heparin level 0.51. Continue current regimen. Recheck heparin level and CBC with tomorrow AM labs.  Eloise Harman [facility] Clinical Pharmacist 06/29/2018 10:19 PM

## 2018-06-29 NOTE — Consult Note (Signed)
ANTICOAGULATION CONSULT NOTE -  Pharmacy Consult for Heparin Dosing  Indication: Ischemic Leg.  Allergies  Allergen Reactions  . Lodine [Etodolac] Hives and Itching  . Methotrexate Derivatives Other (See Comments)    Flu like symptoms  . Nitrofuran Derivatives Nausea And Vomiting and Other (See Comments)    Made sicker   . Levaquin [Levofloxacin] Itching, Rash and Other (See Comments)    Muscle pain  . Norvasc [Amlodipine] Other (See Comments)    Unknown   . Pentasa [Mesalamine Er] Other (See Comments)    Unknown   . Sulfa Antibiotics Itching and Rash  . Vioxx [Rofecoxib] Other (See Comments)    Unknown    Patient Measurements: Height: 5' 4"  (162.6 cm) Weight: 121 lb 14.6 oz (55.3 kg) IBW/kg (Calculated) : 54.7 Vital Signs: Temp: 98.9 F (37.2 C) (12/14 0742) Temp Source: Oral (12/14 0742) BP: 131/79 (12/14 0742) Pulse Rate: 105 (12/14 0742)  Labs: Recent Labs    06/27/18 1104  06/28/18 0550 06/28/18 2231 06/29/18 0418 06/29/18 1402  HGB 8.3*  --  7.8*  --  7.5*  --   HCT 27.1*  --  25.7*  --  23.8*  --   PLT 267  --  226  --  260  --   APTT 35  --   --   --   --   --   LABPROT 15.5*  --   --   --   --   --   INR 1.24  --   --   --   --   --   HEPARINUNFRC  --    < > 0.38 0.30 0.27* 0.42  CREATININE  --   --  0.48  --  0.45  --    < > = values in this interval not displayed.    Estimated Creatinine Clearance: 50.9 mL/min (by C-G formula based on SCr of 0.45 mg/dL).   Medical History: Past Medical History:  Diagnosis Date  . Anemia 12/2017   blood transfusion after amputation  . Anxiety   . Arthritis    rheumatoid  . Ataxia   . Chronic ulcer of right great toe ([place]) 2019   slowly healing  . COPD (chronic obstructive pulmonary disease) ([place])   . Coronary artery disease 2018   complete blockage in one vessel, 40 % in another, 25% in 3rd vessell  . Depression   . Diverticulosis   . Dysrhythmia    atrial fib  . Excessive falling   . GERD  (gastroesophageal reflux disease)    ulcerative colitis  . Hypercholesteremia   . Hyperlipidemia   . Hypertension   . IBS (irritable bowel syndrome)   . MRSA (methicillin resistant staph aureus) culture positive 11/2017   patient unaware of this diagnosis, was never informed or treated  . Myocardial infarction ([place]) 08/2017   had angio but no stents  . Osteoarthritis   . Psoriasis   . Raynaud's disease without gangrene   . Raynaud's disease without gangrene   . Shortness of breath dyspnea    with exertion  . 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 Patient S/P angioplasty right anterior tibial artery on 12/11.   12/12: Hgb: 8.3  Goal of Therapy:  Heparin level 0.3-0.7 units/ml Monitor platelets by anticoagulation protocol: Yes   Plan:  Give 2800 units bolus x 1 Start heparin  infusion at 750 units/hr Check anti-Xa level in 8 hours and daily while on heparin Continue to monitor H&H and platelets  12/13:  Patient went to angiogram. Heparin to be restarted in 1 hour with no bolus per consult. Will restart 1445 at 850 units/hr as previous order. Check HL in 8 hours.  12/14 0400 heparin level 0.27. 800 unit bolus and increase rate to 950 units/hr. Recheck in 8 hours.  12/14 14:00  HL = 0.42.  Continue current drip rate. Recheck tonight at 22:00.  Olivia Canter [facility] Clinical Pharmacist 06/29/2018 3:16 PM

## 2018-06-29 NOTE — Consult Note (Signed)
ANTICOAGULATION CONSULT NOTE -  Pharmacy Consult for Heparin Dosing  Indication: Ischemic Leg.  Allergies  Allergen Reactions  . Lodine [Etodolac] Hives and Itching  . Methotrexate Derivatives Other (See Comments)    Flu like symptoms  . Nitrofuran Derivatives Nausea And Vomiting and Other (See Comments)    Made sicker   . Levaquin [Levofloxacin] Itching, Rash and Other (See Comments)    Muscle pain  . Norvasc [Amlodipine] Other (See Comments)    Unknown   . Pentasa [Mesalamine Er] Other (See Comments)    Unknown   . Sulfa Antibiotics Itching and Rash  . Vioxx [Rofecoxib] Other (See Comments)    Unknown    Patient Measurements: Height: 5' 4"  (162.6 cm) Weight: 121 lb 14.6 oz (55.3 kg) IBW/kg (Calculated) : 54.7 Vital Signs: Temp: 98.9 F (37.2 C) (12/14 0335) Temp Source: Oral (12/14 0335) BP: 135/58 (12/14 0335) Pulse Rate: 105 (12/14 0335)  Labs: Recent Labs    06/26/18 1122  06/27/18 1104  06/28/18 0550 06/28/18 2231 06/29/18 0418  HGB  --    < > 8.3*  --  7.8*  --  7.5*  HCT  --   --  27.1*  --  25.7*  --  23.8*  PLT  --   --  267  --  226  --  260  APTT  --   --  35  --   --   --   --   LABPROT  --   --  15.5*  --   --   --   --   INR  --   --  1.24  --   --   --   --   HEPARINUNFRC  --   --   --    < > 0.38 0.30 0.27*  CREATININE 0.57  --   --   --  0.48  --  0.45   < > = values in this interval not displayed.    Estimated Creatinine Clearance: 50.9 mL/min (by C-G formula based on SCr of 0.45 mg/dL).   Medical History: Past Medical History:  Diagnosis Date  . Anemia 12/2017   blood transfusion after amputation  . Anxiety   . Arthritis    rheumatoid  . Ataxia   . Chronic ulcer of right great toe ([place]) 2019   slowly healing  . COPD (chronic obstructive pulmonary disease) ([place])   . Coronary artery disease 2018   complete blockage in one vessel, 40 % in another, 25% in 3rd vessell  . Depression   . Diverticulosis   . Dysrhythmia    atrial fib   . Excessive falling   . GERD (gastroesophageal reflux disease)    ulcerative colitis  . Hypercholesteremia   . Hyperlipidemia   . Hypertension   . IBS (irritable bowel syndrome)   . MRSA (methicillin resistant staph aureus) culture positive 11/2017   patient unaware of this diagnosis, was never informed or treated  . Myocardial infarction ([place]) 08/2017   had angio but no stents  . Osteoarthritis   . Psoriasis   . Raynaud's disease without gangrene   . Raynaud's disease without gangrene   . Shortness of breath dyspnea    with exertion  . 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 Patient S/P angioplasty right anterior tibial artery on 12/11.   12/12: Hgb: 8.3  Goal of Therapy:  Heparin level 0.3-0.7 units/ml Monitor platelets by anticoagulation protocol: Yes   Plan:  Give 2800 units bolus x 1 Start heparin infusion at 750 units/hr Check anti-Xa level in 8 hours and daily while on heparin Continue to monitor H&H and platelets  12/13:  Patient went to angiogram. Heparin to be restarted in 1 hour with no bolus per consult. Will restart 1445 at 850 units/hr as previous order. Check HL in 8 hours.  12/14 0400 heparin level 0.27. 800 unit bolus and increase rate to 950 units/hr. Recheck in 8 hours.  Eloise Harman, PharmD, BCPS Clinical Pharmacist 06/29/2018 5:27 AM

## 2018-06-29 NOTE — Progress Notes (Signed)
[facility]  Daily Progress Note   Subjective  - 1 Day Post-Op  Patient with some pain in her right foot.  Got some oxycodone earlier which has helped but not resolved the pain.  No fever or major events overnight.  On a heparin drip  Objective Vitals:   06/28/18 1520 06/28/18 1941 06/29/18 0335 06/29/18 0742  BP:  135/75 (!) 135/58 131/79  Pulse:  89 (!) 105 (!) 105  Resp:  17 17   Temp:  98.6 F (37 C) 98.9 F (37.2 C) 98.9 F (37.2 C)  TempSrc:  Oral Oral Oral  SpO2: 93% 98% 98% 100%  Weight:      Height:        Intake/Output Summary (Last 24 hours) at 06/29/2018 1111 Last data filed at 06/29/2018 0918 Gross per 24 hour  Intake 884.77 ml  Output 1835 ml  Net -950.23 ml    PULM  CTAB CV  tachycardic VASC  warm in the right lower leg and ankle area with irreversible ischemic changes to the right forefoot  Laboratory CBC    Component Value Date/Time   WBC 18.1 (H) 06/29/2018 0418   HGB 7.5 (L) 06/29/2018 0418   HGB 11.6 (L) 06/16/2013 1031   HCT 23.8 (L) 06/29/2018 0418   HCT 34.8 (L) 06/16/2013 1031   PLT 260 06/29/2018 0418   PLT 226 06/16/2013 1031    BMET    Component Value Date/Time   NA 136 06/29/2018 0418   NA 139 06/16/2013 1031   K 4.6 06/29/2018 0418   K 3.8 06/16/2013 1031   CL 104 06/29/2018 0418   CL 105 06/16/2013 1031   CO2 24 06/29/2018 0418   CO2 30 06/16/2013 1031   GLUCOSE 117 (H) 06/29/2018 0418   GLUCOSE 96 06/16/2013 1031   BUN 10 06/29/2018 0418   BUN 23 (H) 06/16/2013 1031   CREATININE 0.45 06/29/2018 0418   CREATININE 0.58 (L) 06/16/2013 1031   CALCIUM 8.8 (L) 06/29/2018 0418   CALCIUM 9.7 06/16/2013 1031   GFRNONAA >60 06/29/2018 0418   GFRNONAA >60 06/16/2013 1031   GFRAA >60 06/29/2018 0418   GFRAA >60 06/16/2013 1031    Assessment/Planning: POD #1/3 s/p right lower extremity angiography and revascularization   At this point, has not unreconstructable disease distally and her foot is not  salvageable.  With continued pain, really the only option going forward is going to be a right below-knee amputation.  Her perfusion appears adequate for wound healing at that level  Heparin drip until Monday morning.  Surgery planned for Monday midday  Patient and family voiced their understanding although they are emotional.    Leotis Pain  06/29/2018, 11:11 AM

## 2018-06-30 LAB — BASIC METABOLIC PANEL
Anion gap: 7 (ref 5–15)
BUN: 14 mg/dL (ref 8–23)
CO2: 29 mmol/L (ref 22–32)
Calcium: 9.2 mg/dL (ref 8.9–10.3)
Chloride: 102 mmol/L (ref 98–111)
Creatinine, Ser: 0.51 mg/dL (ref 0.44–1.00)
GFR calc Af Amer: >60 mL/min (ref >60)
GFR calc non Af Amer: >60 mL/min (ref >60)
Glucose, Bld: 100 mg/dL — ABNORMAL HIGH (ref 70–99)
POTASSIUM: 4.3 mmol/L (ref 3.5–5.1)
Sodium: 138 mmol/L (ref 135–145)

## 2018-06-30 LAB — CBC
HCT: 25.6 % — ABNORMAL LOW (ref 36.0–46.0)
Hemoglobin: 7.9 g/dL — ABNORMAL LOW (ref 12.0–15.0)
MCH: 31.2 pg (ref 26.0–34.0)
MCHC: 30.9 g/dL (ref 30.0–36.0)
MCV: 101.2 fL — ABNORMAL HIGH (ref 80.0–100.0)
Platelets: 298 10*3/uL (ref 150–400)
RBC: 2.53 MIL/uL — AB (ref 3.87–5.11)
RDW: 13.8 % (ref 11.5–15.5)
WBC: 15 10*3/uL — ABNORMAL HIGH (ref 4.0–10.5)
nRBC: 0 % (ref 0.0–0.2)

## 2018-06-30 LAB — TYPE AND SCREEN
ABO/RH(D): A POS
Antibody Screen: NEGATIVE

## 2018-06-30 LAB — MAGNESIUM: Magnesium: 1.9 mg/dL (ref 1.7–2.4)

## 2018-06-30 LAB — HEPARIN LEVEL (UNFRACTIONATED): Heparin Unfractionated: 0.54 IU/mL (ref 0.30–0.70)

## 2018-06-30 LAB — GLUCOSE, CAPILLARY: Glucose-Capillary: 143 mg/dL — ABNORMAL HIGH (ref 70–99)

## 2018-06-30 NOTE — Progress Notes (Signed)
[facility]  Daily Progress Note   Subjective  - 2 Days Post-Op  Some [name] in the foot that comes and goes.  Right foot is cool and discolored.  No major events overnight.  Objective Vitals:   06/29/18 0742 06/29/18 1928 06/30/18 0418 06/30/18 0726  BP: 131/79 (!) 121/58 127/69 107/62  Pulse: (!) 105 67 68 99  Resp:  16 17   Temp: 98.9 F (37.2 C) 98.7 F (37.1 C) 98.4 F (36.9 C) 97.6 F (36.4 C)  TempSrc: Oral Oral Oral Oral  SpO2: 100% 98% 100% 94%  Weight:   52.4 kg   Height:        Intake/Output Summary (Last 24 hours) at 06/30/2018 0946 Last data filed at 06/30/2018 0926 Gross per 24 hour  Intake 10 ml  Output 0 ml  Net 10 ml    PULM  CTAB CV  irregular VASC  right foot and ankle are demarcating.  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  Laboratory CBC    Component Value Date/Time   WBC 15.0 (H) 06/30/2018 0556   HGB 7.9 (L) 06/30/2018 0556   HGB 11.6 (L) 06/16/2013 1031   HCT 25.6 (L) 06/30/2018 0556   HCT 34.8 (L) 06/16/2013 1031   PLT 298 06/30/2018 0556   PLT 226 06/16/2013 1031    BMET    Component Value Date/Time   NA 138 06/30/2018 0556   NA 139 06/16/2013 1031   K 4.3 06/30/2018 0556   K 3.8 06/16/2013 1031   CL 102 06/30/2018 0556   CL 105 06/16/2013 1031   CO2 29 06/30/2018 0556   CO2 30 06/16/2013 1031   GLUCOSE 100 (H) 06/30/2018 0556   GLUCOSE 96 06/16/2013 1031   BUN 14 06/30/2018 0556   BUN 23 (H) 06/16/2013 1031   CREATININE 0.51 06/30/2018 0556   CREATININE 0.58 (L) 06/16/2013 1031   CALCIUM 9.2 06/30/2018 0556   CALCIUM 9.7 06/16/2013 1031   GFRNONAA >60 06/30/2018 0556   GFRNONAA >60 06/16/2013 1031   GFRAA >60 06/30/2018 0556   GFRAA >60 06/16/2013 1031    Assessment/Planning: POD #2/4 s/p right lower extremity angiography and revascularization   Non-unreconstructable ischemia of the right foot  Will now require right below-knee amputation  This is scheduled for tomorrow  Risks  and benefits were discussed with the patient and she seems understanding and a to proceed with right below-knee amputation    Melissa [name]  06/30/2018, 9:46 AM

## 2018-06-30 NOTE — Consult Note (Signed)
ANTICOAGULATION CONSULT NOTE -  Pharmacy Consult for Heparin Dosing  Indication: Ischemic Leg.  Allergies  Allergen Reactions  . Lodine [Etodolac] Hives and Itching  . Methotrexate Derivatives Other (See Comments)    Flu like symptoms  . Nitrofuran Derivatives Nausea And Vomiting and Other (See Comments)    Made sicker   . Levaquin [Levofloxacin] Itching, Rash and Other (See Comments)    Muscle pain  . Norvasc [Amlodipine] Other (See Comments)    Unknown   . Pentasa [Mesalamine Er] Other (See Comments)    Unknown   . Sulfa Antibiotics Itching and Rash  . Vioxx [Rofecoxib] Other (See Comments)    Unknown    Patient Measurements: Height: 5' 4"  (162.6 cm) Weight: 115 lb 8 oz (52.4 kg) IBW/kg (Calculated) : 54.7 Vital Signs: Temp: 98.4 F (36.9 C) (12/15 0418) Temp Source: Oral (12/15 0418) BP: 127/69 (12/15 0418) Pulse Rate: 68 (12/15 0418)  Labs: Recent Labs    06/27/18 1104  06/28/18 0550  06/29/18 0418 06/29/18 1402 06/29/18 2140 06/30/18 0556  HGB 8.3*  --  7.8*  --  7.5*  --   --  7.9*  HCT 27.1*  --  25.7*  --  23.8*  --   --  25.6*  PLT 267  --  226  --  260  --   --  298  APTT 35  --   --   --   --   --   --   --   LABPROT 15.5*  --   --   --   --   --   --   --   INR 1.24  --   --   --   --   --   --   --   HEPARINUNFRC  --    < > 0.38   < > 0.27* 0.42 0.51 0.54  CREATININE  --   --  0.48  --  0.45  --   --  0.51   < > = values in this interval not displayed.    Estimated Creatinine Clearance: 48.7 mL/min (by C-G formula based on SCr of 0.51 mg/dL).   Medical History: Past Medical History:  Diagnosis Date  . Anemia 12/2017   blood transfusion after amputation  . Anxiety   . Arthritis    rheumatoid  . Ataxia   . Chronic ulcer of right great toe ([place]) 2019   slowly healing  . COPD (chronic obstructive pulmonary disease) ([place])   . Coronary artery disease 2018   complete blockage in one vessel, 40 % in another, 25% in 3rd vessell  . Depression    . Diverticulosis   . Dysrhythmia    atrial fib  . Excessive falling   . GERD (gastroesophageal reflux disease)    ulcerative colitis  . Hypercholesteremia   . Hyperlipidemia   . Hypertension   . IBS (irritable bowel syndrome)   . MRSA (methicillin resistant staph aureus) culture positive 11/2017   patient unaware of this diagnosis, was never informed or treated  . Myocardial infarction ([place]) 08/2017   had angio but no stents  . Osteoarthritis   . Psoriasis   . Raynaud's disease without gangrene   . Raynaud's disease without gangrene   . Shortness of breath dyspnea    with exertion  . 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 Patient S/P angioplasty right anterior tibial artery on 12/11.   12/12: Hgb: 8.3  Goal of Therapy:  Heparin level 0.3-0.7 units/ml Monitor platelets by anticoagulation protocol: Yes   Plan:  Give 2800 units bolus x 1 Start heparin infusion at 750 units/hr Check anti-Xa level in 8 hours and daily while on heparin Continue to monitor H&H and platelets  12/13:  Patient went to angiogram. Heparin to be restarted in 1 hour with no bolus per consult. Will restart 1445 at 850 units/hr as previous order. Check HL in 8 hours.  12/14 0400 heparin level 0.27. 800 unit bolus and increase rate to 950 units/hr. Recheck in 8 hours.  12/14 14:00  HL = 0.42.  Continue current drip rate. Recheck tonight at 22:00.  12/14 PM heparin level 0.51. Continue current regimen. Recheck heparin level and CBC with tomorrow AM labs.  12/15 AM heparin level 0.54. Continue current regimen. Recheck heparin level and CBC with tomorrow AM labs.  Eloise Harman [facility] Clinical Pharmacist 06/30/2018 6:25 AM

## 2018-07-01 ENCOUNTER — Inpatient Hospital Stay: Payer: Medicare HMO | Admitting: Anesthesiology

## 2018-07-01 ENCOUNTER — Ambulatory Visit (INDEPENDENT_AMBULATORY_CARE_PROVIDER_SITE_OTHER): Payer: Self-pay | Admitting: Vascular Surgery

## 2018-07-01 ENCOUNTER — Encounter
Admission: AD | Disposition: A | Discharge status: Skilled Nursing Facility | Payer: Self-pay | Source: Home / Self Care | Attending: Vascular Surgery

## 2018-07-01 ENCOUNTER — Encounter: Payer: Self-pay | Admitting: Vascular Surgery

## 2018-07-01 DIAGNOSIS — I70261 Atherosclerosis of native arteries of extremities with gangrene, right leg: Secondary | ICD-10-CM

## 2018-07-01 HISTORY — PX: AMPUTATION: SHX166

## 2018-07-01 LAB — BASIC METABOLIC PANEL
ANION GAP: 9 (ref 5–15)
BUN: 17 mg/dL (ref 8–23)
CO2: 28 mmol/L (ref 22–32)
Calcium: 9.3 mg/dL (ref 8.9–10.3)
Chloride: 101 mmol/L (ref 98–111)
Creatinine, Ser: 0.53 mg/dL (ref 0.44–1.00)
GFR calc Af Amer: >60 mL/min (ref >60)
GFR calc non Af Amer: >60 mL/min (ref >60)
Glucose, Bld: 95 mg/dL (ref 70–99)
Potassium: 3.9 mmol/L (ref 3.5–5.1)
Sodium: 138 mmol/L (ref 135–145)

## 2018-07-01 LAB — CBC
HCT: 26.3 % — ABNORMAL LOW (ref 36.0–46.0)
Hemoglobin: 8 g/dL — ABNORMAL LOW (ref 12.0–15.0)
MCH: 31 pg (ref 26.0–34.0)
MCHC: 30.4 g/dL (ref 30.0–36.0)
MCV: 101.9 fL — ABNORMAL HIGH (ref 80.0–100.0)
NRBC: 0 % (ref 0.0–0.2)
Platelets: 323 10*3/uL (ref 150–400)
RBC: 2.58 MIL/uL — ABNORMAL LOW (ref 3.87–5.11)
RDW: 13.9 % (ref 11.5–15.5)
WBC: 15.7 10*3/uL — AB (ref 4.0–10.5)

## 2018-07-01 LAB — HEPARIN LEVEL (UNFRACTIONATED): Heparin Unfractionated: 0.36 IU/mL (ref 0.30–0.70)

## 2018-07-01 LAB — PROTIME-INR
INR: 1.08
Prothrombin Time: 13.9 seconds (ref 11.4–15.2)

## 2018-07-01 LAB — APTT: APTT: 69 s — AB (ref 24–36)

## 2018-07-01 LAB — MAGNESIUM: Magnesium: 1.7 mg/dL (ref 1.7–2.4)

## 2018-07-01 SURGERY — AMPUTATION BELOW KNEE
Anesthesia: General | Site: Leg Lower | Laterality: Right

## 2018-07-01 MED ORDER — CEFAZOLIN SODIUM-DEXTROSE 1-4 GM/50ML-% IV SOLN
1.0000 g | Freq: Three times a day (TID) | INTRAVENOUS | Status: AC
  Administered 2018-07-01 – 2018-07-02 (×3): 1 g via INTRAVENOUS
  Filled 2018-07-01 (×3): qty 50

## 2018-07-01 MED ORDER — FENTANYL CITRATE (PF) 100 MCG/2ML IJ SOLN: 25.0000 ug | INTRAMUSCULAR | Status: DC | PRN

## 2018-07-01 MED ORDER — ONDANSETRON HCL 4 MG/2ML IJ SOLN: 4.0000 mg | Freq: Once | INTRAMUSCULAR | Status: DC | PRN

## 2018-07-01 MED ORDER — HYDROMORPHONE HCL 1 MG/ML IJ SOLN
INTRAMUSCULAR | Status: AC
  Filled 2018-07-01: qty 1

## 2018-07-01 MED ORDER — PROPOFOL 10 MG/ML IV BOLUS
INTRAVENOUS | Status: AC
  Filled 2018-07-01: qty 20

## 2018-07-01 MED ORDER — SUGAMMADEX SODIUM 200 MG/2ML IV SOLN
INTRAVENOUS | Status: DC | PRN
  Administered 2018-07-01: 104.6 mg via INTRAVENOUS

## 2018-07-01 MED ORDER — PROPOFOL 10 MG/ML IV BOLUS
INTRAVENOUS | Status: DC | PRN
  Administered 2018-07-01: 50 mg via INTRAVENOUS

## 2018-07-01 MED ORDER — PROPOFOL 500 MG/50ML IV EMUL
INTRAVENOUS | Status: AC
  Filled 2018-07-01: qty 50

## 2018-07-01 MED ORDER — ROCURONIUM BROMIDE 100 MG/10ML IV SOLN
INTRAVENOUS | Status: DC | PRN
  Administered 2018-07-01: 30 mg via INTRAVENOUS

## 2018-07-01 MED ORDER — FENTANYL CITRATE (PF) 100 MCG/2ML IJ SOLN
INTRAMUSCULAR | Status: AC
  Filled 2018-07-01: qty 2

## 2018-07-01 MED ORDER — FENTANYL CITRATE (PF) 250 MCG/5ML IJ SOLN
INTRAMUSCULAR | Status: AC
  Filled 2018-07-01: qty 5

## 2018-07-01 MED ORDER — FENTANYL CITRATE (PF) 100 MCG/2ML IJ SOLN
INTRAMUSCULAR | Status: DC | PRN
  Administered 2018-07-01 (×3): 50 ug via INTRAVENOUS

## 2018-07-01 MED ORDER — MIDAZOLAM HCL 2 MG/2ML IJ SOLN
INTRAMUSCULAR | Status: AC
  Filled 2018-07-01: qty 2

## 2018-07-01 SURGICAL SUPPLY — 56 items
BAG COUNTER SPONGE EZ (MISCELLANEOUS) ×2 IMPLANT
BANDAGE ELASTIC 4 LF NS (GAUZE/BANDAGES/DRESSINGS) ×3 IMPLANT
BLADE SAGITTAL WIDE XTHICK NO (BLADE) ×3 IMPLANT
BLADE SAW GIGLI 510 (BLADE) ×2 IMPLANT
BLADE SAW GIGLI 510MM (BLADE) ×1
BLADE SAW SAG 25X90X1.19 (BLADE) ×2 IMPLANT
BLADE SURG SZ10 CARB STEEL (BLADE) ×3 IMPLANT
BNDG COHESIVE 4X5 TAN STRL (GAUZE/BANDAGES/DRESSINGS) ×3 IMPLANT
BNDG GAUZE 4.5X4.1 6PLY STRL (MISCELLANEOUS) ×3 IMPLANT
BRUSH SCRUB EZ  4% CHG (MISCELLANEOUS) ×2
BRUSH SCRUB EZ 4% CHG (MISCELLANEOUS) ×1 IMPLANT
CANISTER SUCT 1200ML W/VALVE (MISCELLANEOUS) ×3 IMPLANT
CHLORAPREP W/TINT 26ML (MISCELLANEOUS) ×3 IMPLANT
COUNTER SPONGE BAG EZ (MISCELLANEOUS) ×1
COVER LIGHT HANDLE STERIS (MISCELLANEOUS) ×2 IMPLANT
COVER WAND RF STERILE (DRAPES) ×3 IMPLANT
DERMABOND ADVANCED (GAUZE/BANDAGES/DRESSINGS) ×4
DERMABOND ADVANCED .7 DNX12 (GAUZE/BANDAGES/DRESSINGS) IMPLANT
DRAPE INCISE IOBAN 66X45 STRL (DRAPES) ×3 IMPLANT
DRSG GAUZE FLUFF 36X18 (GAUZE/BANDAGES/DRESSINGS) ×3 IMPLANT
DRSG TEGADERM 4X10 (GAUZE/BANDAGES/DRESSINGS) ×2 IMPLANT
ELECT CAUTERY BLADE 6.4 (BLADE) ×3 IMPLANT
ELECT REM PT RETURN 9FT ADLT (ELECTROSURGICAL) ×3
ELECTRODE REM PT RTRN 9FT ADLT (ELECTROSURGICAL) ×1 IMPLANT
GAUZE PETRO XEROFOAM 1X8 (MISCELLANEOUS) ×3 IMPLANT
GLOVE BIO SURGEON STRL SZ7 (GLOVE) ×3 IMPLANT
GLOVE INDICATOR 7.5 STRL GRN (GLOVE) ×3 IMPLANT
GLOVE SURG SYN 8.0 (GLOVE) ×3 IMPLANT
GLOVE SURG SYN 8.0 PF PI (GLOVE) ×1 IMPLANT
GOWN STRL REUS W/ TWL LRG LVL3 (GOWN DISPOSABLE) ×2 IMPLANT
GOWN STRL REUS W/ TWL XL LVL3 (GOWN DISPOSABLE) ×1 IMPLANT
GOWN STRL REUS W/TWL LRG LVL3 (GOWN DISPOSABLE) ×4
GOWN STRL REUS W/TWL XL LVL3 (GOWN DISPOSABLE) ×2
HANDLE YANKAUER SUCT BULB TIP (MISCELLANEOUS) ×3 IMPLANT
KIT TURNOVER KIT A (KITS) ×3 IMPLANT
LABEL OR SOLS (LABEL) ×3 IMPLANT
NS IRRIG 500ML POUR BTL (IV SOLUTION) ×3 IMPLANT
PACK EXTREMITY ARMC (MISCELLANEOUS) ×3 IMPLANT
PAD ABD DERMACEA PRESS 5X9 (GAUZE/BANDAGES/DRESSINGS) ×3 IMPLANT
PAD PREP 24X41 OB/GYN DISP (PERSONAL CARE ITEMS) ×3 IMPLANT
SPONGE LAP 18X18 RF (DISPOSABLE) ×8 IMPLANT
STOCKINETTE M/LG 89821 (MISCELLANEOUS) ×1 IMPLANT
SUT MNCRL 4-0 (SUTURE) ×2
SUT MNCRL 4-0 27XMFL (SUTURE) ×1
SUT SILK 2 0 (SUTURE) ×2
SUT SILK 2-0 18XBRD TIE 12 (SUTURE) ×1 IMPLANT
SUT SILK 3 0 (SUTURE) ×2
SUT SILK 3-0 18XBRD TIE 12 (SUTURE) ×1 IMPLANT
SUT VIC AB 0 CT1 36 (SUTURE) ×8 IMPLANT
SUT VIC AB 3-0 SH 27 (SUTURE) ×2
SUT VIC AB 3-0 SH 27X BRD (SUTURE) ×2 IMPLANT
SUT VICRYL PLUS ABS 0 54 (SUTURE) ×3 IMPLANT
SUTURE MNCRL 4-0 27XMF (SUTURE) ×1 IMPLANT
SYR 20CC LL (SYRINGE) ×6 IMPLANT
TAPE UMBIL 1/8X18 RADIOPA (MISCELLANEOUS) ×3 IMPLANT
TOWEL OR 17X26 4PK STRL BLUE (TOWEL DISPOSABLE) ×6 IMPLANT

## 2018-07-01 NOTE — Op Note (Signed)
   OPERATIVE NOTE   PROCEDURE: Right below-the-knee amputation  PRE-OPERATIVE DIAGNOSIS: Right foot gangrene  POST-OPERATIVE DIAGNOSIS: same as above  SURGEON: Katha Cabal, MD  ASSISTANT(S): Ms. Hezzie Bump  ANESTHESIA: general  ESTIMATED BLOOD LOSS: 50 cc  FINDING(S): Viable muscle bellies  SPECIMEN(S):  Right below-the-knee amputation  INDICATIONS:   Melissa [name] is a 77 y.o. female who presents with right leg gangrene.  The patient is scheduled for a right below-the-knee amputation.  I discussed in depth with the patient the risks, benefits, and alternatives to this procedure.  The patient is aware that the risk of this operation included but are not limited to:  bleeding, infection, myocardial infarction, stroke, death, failure to heal amputation wound, and possible need for more proximal amputation.  The patient is aware of the risks and agrees proceed forward with the procedure.  DESCRIPTION:  After full informed written consent was obtained from the patient, the patient was brought back to the operating room, and placed supine upon the operating table.  Prior to induction, the patient received IV antibiotics.  The patient was then prepped and draped in the standard fashion for a below-the-knee amputation.  After obtaining adequate anesthesia, the patient was prepped and draped in the standard fashion for a right below-the-knee amputation.  I marked out the anterior incision two finger breadths below the tibial tuberosity and then the marked out a posterior flap that was one third of the circumference of the calf in length.   I made the incisions for these flaps, and then dissected through the subcutaneous tissue, fascia, and muscle anteriorly.  I elevated  the periosteal tissue superiorly so that the tibia was about 3-4 cm shorter than the anterior skin flap.  I then transected the tibia with a power saw and then took a wedge off the tibia anteriorly with the power saw.   Then I smoothed out the rough edges.  In a similar fashion, I cut back the fibula about two centimeters higher than the level of the tibia with a bone cutter.  I put a bone hook into the distal tibia and then used a large amputation knife to sharply develop a tissue plane through the muscle along the fibula.  In such fashion, the posterior flap was developed.  At this point, the specimen was passed off the field as the below-the-knee amputation.  At this point, I clamped all visibly bleeding arteries and veins using a combination of suture ligation with Silk suture and electrocautery.  Bleeding continued to be controlled with electrocautery and suture ligature.  The stump was washed off with sterile normal saline and no further active bleeding was noted.  I reapproximated the anterior and posterior fascia  with interrupted stitches of 0 Vicryl.  This was completed along the entire length of anterior and posterior fascia until there were no more loose space in the fascial line. I then placed a layer of 2-0 Vicryl sutures in the subcutaneous tissue. The skin was then  reapproximated with 4-0 Monocryl subcuticular.  The stump was washed off and dried.  The incision was dressed with Xeroform and  then fluffs were applied.  Kerlix was wrapped around the leg and then gently an ACE wrap was applied.    COMPLICATIONS: none  CONDITION: stable   Melissa [name]  07/01/2018, 3:32 PM    This note was created with Dragon Medical transcription system. Any errors in dictation are purely unintentional.

## 2018-07-01 NOTE — Care Management Important Message (Signed)
Copy of signed IM left with patient in room.  

## 2018-07-01 NOTE — Anesthesia Post-op Follow-up Note (Signed)
Anesthesia QCDR form completed.        

## 2018-07-01 NOTE — H&P (Signed)
[facility] History & Physical Update  The patient was interviewed and re-examined.  The patient's previous History and Physical has been reviewed and is unchanged.  There is no change in the plan of care. We plan to proceed with the scheduled procedure.  [name], MD  07/01/2018, 12:23 PM

## 2018-07-01 NOTE — Progress Notes (Signed)
P.A.D. removed at 1645. Site is clean and dry, no hematoma noted at site. Arlene Genova E 5:13 PM 07/01/2018

## 2018-07-01 NOTE — Anesthesia Procedure Notes (Signed)
Procedure Name: LMA Insertion Date/Time: 07/01/2018 2:17 PM Performed by: Nelda Marseille, CRNA Pre-anesthesia Checklist: Patient identified, Patient being monitored, Timeout performed, Emergency Drugs available and Suction available Patient Re-evaluated:Patient Re-evaluated prior to induction Oxygen Delivery Method: Circle system utilized Preoxygenation: Pre-oxygenation with 100% oxygen Induction Type: IV induction Ventilation: Mask ventilation without difficulty LMA: LMA inserted LMA Size: 3.5 Tube type: Oral Number of attempts: 1 Placement Confirmation: positive ETCO2 and breath sounds checked- equal and bilateral Tube secured with: Tape Dental Injury: Teeth and Oropharynx as per pre-operative assessment

## 2018-07-01 NOTE — Transfer of Care (Signed)
Immediate Anesthesia Transfer of Care Note  Patient: Melissa [name]  Procedure(s) Performed: AMPUTATION BELOW KNEE (Right Leg Lower)  Patient Location: PACU  Anesthesia Type:General  Level of Consciousness: sedated  Airway & Oxygen Therapy: Patient Spontanous Breathing and Patient connected to face mask oxygen  Post-op Assessment: Report given to RN and Post -op Vital signs reviewed and stable  Post vital signs: Reviewed and stable  Last Vitals:  Vitals Value Taken Time  BP    Temp    Pulse    Resp    SpO2      Last Pain:  Vitals:   07/01/18 1208  TempSrc: Temporal  PainSc:       Patients Stated Pain Goal: 2 (00/97/94 9971)  Complications: No apparent anesthesia complications

## 2018-07-01 NOTE — Progress Notes (Signed)
Per. Dr. Delana Meyer okay to stop heparin gtt for procedure, pt is schedule to have a R BKA at noon today. Will continue to monitor.

## 2018-07-01 NOTE — Consult Note (Signed)
ANTICOAGULATION CONSULT NOTE -  Pharmacy Consult for Heparin Dosing  Indication: Ischemic Leg.  Allergies  Allergen Reactions  . Lodine [Etodolac] Hives and Itching  . Methotrexate Derivatives Other (See Comments)    Flu like symptoms  . Nitrofuran Derivatives Nausea And Vomiting and Other (See Comments)    Made sicker   . Levaquin [Levofloxacin] Itching, Rash and Other (See Comments)    Muscle pain  . Norvasc [Amlodipine] Other (See Comments)    Unknown   . Pentasa [Mesalamine Er] Other (See Comments)    Unknown   . Sulfa Antibiotics Itching and Rash  . Vioxx [Rofecoxib] Other (See Comments)    Unknown    Patient Measurements: Height: 5' 4"  (162.6 cm) Weight: 115 lb 4.8 oz (52.3 kg) IBW/kg (Calculated) : 54.7 Vital Signs: Temp: 98.4 F (36.9 C) (12/16 0345) Temp Source: Oral (12/16 0345) BP: 108/47 (12/16 0345) Pulse Rate: 85 (12/16 0345)  Labs: Recent Labs    06/29/18 0418  06/29/18 2140 06/30/18 0556 07/01/18 0544  HGB 7.5*  --   --  7.9* 8.0*  HCT 23.8*  --   --  25.6* 26.3*  PLT 260  --   --  298 323  APTT  --   --   --   --  69*  LABPROT  --   --   --   --  13.9  INR  --   --   --   --  1.08  HEPARINUNFRC 0.27*   < > 0.51 0.54 0.36  CREATININE 0.45  --   --  0.51 0.53   < > = values in this interval not displayed.    Estimated Creatinine Clearance: 48.6 mL/min (by C-G formula based on SCr of 0.53 mg/dL).   Medical History: Past Medical History:  Diagnosis Date  . Anemia 12/2017   blood transfusion after amputation  . Anxiety   . Arthritis    rheumatoid  . Ataxia   . Chronic ulcer of right great toe ([place]) 2019   slowly healing  . COPD (chronic obstructive pulmonary disease) ([place])   . Coronary artery disease 2018   complete blockage in one vessel, 40 % in another, 25% in 3rd vessell  . Depression   . Diverticulosis   . Dysrhythmia    atrial fib  . Excessive falling   . GERD (gastroesophageal reflux disease)    ulcerative colitis  .  Hypercholesteremia   . Hyperlipidemia   . Hypertension   . IBS (irritable bowel syndrome)   . MRSA (methicillin resistant staph aureus) culture positive 11/2017   patient unaware of this diagnosis, was never informed or treated  . Myocardial infarction ([place]) 08/2017   had angio but no stents  . Osteoarthritis   . Psoriasis   . Raynaud's disease without gangrene   . Raynaud's disease without gangrene   . Shortness of breath dyspnea    with exertion  . 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 Patient S/P angioplasty right anterior tibial artery on 12/11.   12/12: Hgb: 8.3  Goal of Therapy:  Heparin level 0.3-0.7 units/ml Monitor platelets by anticoagulation protocol: Yes   Plan:  Give 2800 units bolus x 1 Start heparin infusion at 750 units/hr Check anti-Xa level in 8 hours and daily while on heparin Continue to monitor H&H and platelets  12/13:  Patient went to angiogram.  Heparin to be restarted in 1 hour with no bolus per consult. Will restart 1445 at 850 units/hr as previous order. Check HL in 8 hours.  12/14 0400 heparin level 0.27. 800 unit bolus and increase rate to 950 units/hr. Recheck in 8 hours.  12/14 14:00  HL = 0.42.  Continue current drip rate. Recheck tonight at 22:00.  12/14 PM heparin level 0.51. Continue current regimen. Recheck heparin level and CBC with tomorrow AM labs.  12/15 AM heparin level 0.54. Continue current regimen. Recheck heparin level and CBC with tomorrow AM labs.  12/16 AM heparin level 0.36. Continue current regimen. Recheck heparin level and CBC with tomorrow AM labs  Melissa [name] [facility] Clinical Pharmacist 07/01/2018 6:56 AM

## 2018-07-01 NOTE — Anesthesia Procedure Notes (Addendum)
Procedure Name: Intubation Date/Time: 07/01/2018 2:41 PM Performed by: Nelda Marseille, CRNA Pre-anesthesia Checklist: Patient identified, Patient being monitored, Timeout performed, Emergency Drugs available and Suction available Patient Re-evaluated:Patient Re-evaluated prior to induction Oxygen Delivery Method: Circle system utilized Preoxygenation: Pre-oxygenation with 100% oxygen Induction Type: IV induction Ventilation: Mask ventilation without difficulty Laryngoscope Size: Mac, 3 and Glidescope Grade View: Grade II Tube type: Oral Tube size: 7.0 mm Number of attempts: 1 Airway Equipment and Method: Stylet and Video-laryngoscopy Placement Confirmation: ETT inserted through vocal cords under direct vision,  positive ETCO2 and breath sounds checked- equal and bilateral Secured at: 21 cm Tube secured with: Tape Dental Injury: Teeth and Oropharynx as per pre-operative assessment  Difficulty Due To: Difficulty was unanticipated Comments: ETT placed secondary to LMA failing to seat.

## 2018-07-02 ENCOUNTER — Encounter: Payer: Self-pay | Admitting: Vascular Surgery

## 2018-07-02 LAB — CBC
HEMATOCRIT: 24.7 % — AB (ref 36.0–46.0)
Hemoglobin: 7.4 g/dL — ABNORMAL LOW (ref 12.0–15.0)
MCH: 30.8 pg (ref 26.0–34.0)
MCHC: 30 g/dL (ref 30.0–36.0)
MCV: 102.9 fL — AB (ref 80.0–100.0)
Platelets: 283 10*3/uL (ref 150–400)
RBC: 2.4 MIL/uL — ABNORMAL LOW (ref 3.87–5.11)
RDW: 13.7 % (ref 11.5–15.5)
WBC: 13.7 10*3/uL — ABNORMAL HIGH (ref 4.0–10.5)
nRBC: 0 % (ref 0.0–0.2)

## 2018-07-02 MED ORDER — SODIUM CHLORIDE 0.9 % IV BOLUS
500.0000 mL | Freq: Once | INTRAVENOUS | Status: AC
  Administered 2018-07-02: 500 mL via INTRAVENOUS

## 2018-07-02 MED ORDER — APIXABAN 5 MG PO TABS
5.0000 mg | ORAL_TABLET | Freq: Two times a day (BID) | ORAL | Status: DC
  Administered 2018-07-02 – 2018-07-11 (×18): 5 mg via ORAL
  Filled 2018-07-02 (×18): qty 1

## 2018-07-02 MED ORDER — POLYETHYLENE GLYCOL 3350 17 G PO PACK: 17.0000 g | PACK | Freq: Every day | ORAL | Status: DC | PRN

## 2018-07-02 MED ORDER — DILTIAZEM HCL ER COATED BEADS 120 MG PO CP24
120.0000 mg | ORAL_CAPSULE | Freq: Once | ORAL | Status: AC
  Administered 2018-07-02: 120 mg via ORAL

## 2018-07-02 MED ORDER — MORPHINE SULFATE (PF) 2 MG/ML IV SOLN
2.0000 mg | INTRAVENOUS | Status: DC | PRN
  Administered 2018-07-02: 2 mg via INTRAVENOUS
  Filled 2018-07-02: qty 1

## 2018-07-02 MED ORDER — DOCUSATE SODIUM 100 MG PO CAPS
100.0000 mg | ORAL_CAPSULE | Freq: Two times a day (BID) | ORAL | Status: DC
  Administered 2018-07-02 – 2018-07-08 (×8): 100 mg via ORAL
  Filled 2018-07-02 (×17): qty 1

## 2018-07-02 NOTE — Consult Note (Signed)
[facility] [place] NAME: Melissa [name]    MR#:  [id number]  DATE OF BIRTH:  [birth year]  DATE OF ADMISSION:  06/26/2018  PRIMARY CARE PHYSICIAN: [name], MD   REQUESTING/REFERRING PHYSICIAN: Dr Delana Meyer  CHIEF COMPLAINT:  management of medical problems  HISTORY OF PRESENT ILLNESS:  Melissa [name]  is a 77 y.o. female with a known history of severe peripheral vascular/peripheral arterial disease will was admitted about six days ago for right lower extremity gangrene. Patient underwent two revascularization procedures. She was placed on IV heparin drip. Her campaign continued without any improvement in the right foot and hands underwent right below knee amputation on number 16 2019 by Dr. Delana Meyer. She is postop day one. Internal medicine was consulted for management of medical problems  Patient denies any complaints. She appears tired and weak. She denies any chest pain. She has pain on the amputated leg. Daughter in the room.  PAST MEDICAL HISTORY:   Past Medical History:  Diagnosis Date  . Anemia 12/2017   blood transfusion after amputation  . Anxiety   . Arthritis    rheumatoid  . Ataxia   . Chronic ulcer of right great toe ([place]) 2019   slowly healing  . COPD (chronic obstructive pulmonary disease) ([place])   . Coronary artery disease 2018   complete blockage in one vessel, 40 % in another, 25% in 3rd vessell  . Depression   . Diverticulosis   . Dysrhythmia    atrial fib  . Excessive falling   . GERD (gastroesophageal reflux disease)    ulcerative colitis  . Hypercholesteremia   . Hyperlipidemia   . Hypertension   . IBS (irritable bowel syndrome)   . MRSA (methicillin resistant staph aureus) culture positive 11/2017   patient unaware of this diagnosis, was never informed or treated  . Myocardial infarction ([place]) 08/2017   had angio but no stents  . Osteoarthritis   . Psoriasis   . Raynaud's disease without  gangrene   . Raynaud's disease without gangrene   . Shortness of breath dyspnea    with exertion  . 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 ABDOMINAL HYSTERECTOMY  1980  . AMPUTATION Left 12/12/2017   Procedure: AMPUTATION BELOW KNEE;  Surgeon: Katha Cabal, MD;  Location: [facility] ORS;  Service: Vascular;  Laterality: Left;  . AMPUTATION Left 02/08/2018   Procedure: AMPUTATION BELOW KNEE ( REVISION );  Surgeon: Katha Cabal, MD;  Location: [facility]S;  Service: Vascular;  Laterality: Left;  . AMPUTATION Right 07/01/2018   Procedure: AMPUTATION BELOW KNEE;  Surgeon: Katha Cabal, MD;  Location: [facility] ORS;  Service: Vascular;  Laterality: Right;  . APPENDECTOMY  1956  . APPLICATION OF WOUND VAC Left 01/25/2018   Procedure: APPLICATION OF WOUND VAC;  Surgeon: Katha Cabal, MD;  Location: [facility] ORS;  Service: Vascular;  Laterality: Left;  . APPLICATION OF WOUND VAC Left 03/08/2018   Procedure: APPLICATION OF WOUND VAC;  Surgeon: Katha Cabal, MD;  Location: [facility]S;  Service: Vascular;  Laterality: Left;  . BACK SURGERY  2013   Spinal fusion with rods and screws  . CHOLECYSTECTOMY    . COLONOSCOPY WITH PROPOFOL N/A 01/29/2017   Procedure: COLONOSCOPY WITH PROPOFOL;  Surgeon: Manya Silvas, MD;  Location: [facility] ENDOSCOPY;  Service: Endoscopy;  Laterality: N/A;  .  ESOPHAGOGASTRODUODENOSCOPY (EGD) WITH PROPOFOL N/A 01/29/2017   Procedure: ESOPHAGOGASTRODUODENOSCOPY (EGD) WITH PROPOFOL;  Surgeon: Manya Silvas, MD;  Location: [facility] ENDOSCOPY;  Service: Endoscopy;  Laterality: N/A;  . EYE SURGERY Bilateral 2012   Cataract Extraction with IOL  . KNEE ARTHROSCOPY Left 2009   partial lateral meniscectomy, debridement, excision of Baker's cyst  . LEFT HEART CATH AND CORONARY ANGIOGRAPHY N/A 04/23/2017   Procedure: LEFT HEART CATH AND CORONARY ANGIOGRAPHY;  Surgeon:  Teodoro Spray, MD;  Location: [place] CV LAB;  Service: Cardiovascular;  Laterality: N/A;  . LEFT HEART CATH AND CORONARY ANGIOGRAPHY Left 09/07/2017   Procedure: LEFT HEART CATH AND CORONARY ANGIOGRAPHY;  Surgeon: Teodoro Spray, MD;  Location: [place] CV LAB;  Service: Cardiovascular;  Laterality: Left;  . LOWER EXTREMITY ANGIOGRAPHY Left 11/27/2017   Procedure: LOWER EXTREMITY ANGIOGRAPHY;  Surgeon: Katha Cabal, MD;  Location: [place] CV LAB;  Service: Cardiovascular;  Laterality: Left;  . LOWER EXTREMITY ANGIOGRAPHY Left 12/05/2017   Procedure: LOWER EXTREMITY ANGIOGRAPHY;  Surgeon: Katha Cabal, MD;  Location: [place] CV LAB;  Service: Cardiovascular;  Laterality: Left;  . LOWER EXTREMITY ANGIOGRAPHY Right 06/21/2018   Procedure: LOWER EXTREMITY ANGIOGRAPHY;  Surgeon: Katha Cabal, MD;  Location: [place] CV LAB;  Service: Cardiovascular;  Laterality: Right;  . LOWER EXTREMITY ANGIOGRAPHY Right 06/26/2018   Procedure: LOWER EXTREMITY ANGIOGRAPHY;  Surgeon: Katha Cabal, MD;  Location: [place] CV LAB;  Service: Cardiovascular;  Laterality: Right;  . LOWER EXTREMITY ANGIOGRAPHY Right 06/28/2018   Procedure: Lower Extremity Angiography;  Surgeon: Katha Cabal, MD;  Location: [place] CV LAB;  Service: Cardiovascular;  Laterality: Right;  . ROTATOR CUFF REPAIR Right 1990  . TOTAL KNEE ARTHROPLASTY Left 04/04/2016   Procedure: TOTAL KNEE ARTHROPLASTY;  Surgeon: Hessie Knows, MD;  Location: [facility] ORS;  Service: Orthopedics;  Laterality: Left;  . WOUND DEBRIDEMENT Left 01/25/2018   Procedure: DEBRIDEMENT WOUND;  Surgeon: Katha Cabal, MD;  Location: [facility] ORS;  Service: Vascular;  Laterality: Left;  . WOUND DEBRIDEMENT Left 03/08/2018   Procedure: DEBRIDEMENT WOUND;  Surgeon: Katha Cabal, MD;  Location: [facility] ORS;  Service: Vascular;  Laterality: Left;    SOCIAL HISTORY:   Social History   Tobacco Use  . Smoking  status: Never Smoker  . Smokeless tobacco: Never Used  Substance Use Topics  . Alcohol use: No    FAMILY HISTORY:   Family History  Problem Relation Age of Onset  . CAD Mother   . CAD Father   . Alzheimer's disease Sister   . CAD Sister   . CAD Brother   . Breast cancer Neg Hx     DRUG ALLERGIES:   Allergies  Allergen Reactions  . Lodine [Etodolac] Hives and Itching  . Methotrexate Derivatives Other (See Comments)    Flu like symptoms  . Nitrofuran Derivatives Nausea And Vomiting and Other (See Comments)    Made sicker   . Levaquin [Levofloxacin] Itching, Rash and Other (See Comments)    Muscle pain  . Norvasc [Amlodipine] Other (See Comments)    Unknown   . Pentasa [Mesalamine Er] Other (See Comments)    Unknown   . Sulfa Antibiotics Itching and Rash  . Vioxx [Rofecoxib] Other (See Comments)    Unknown     REVIEW OF SYSTEMS:   Review of Systems  Constitutional: Negative for chills, fever and weight loss.  HENT: Negative for ear discharge, ear pain and nosebleeds.   Eyes: Negative  for blurred vision, pain and discharge.  Respiratory: Negative for sputum production, shortness of breath, wheezing and stridor.   Cardiovascular: Negative for chest pain, palpitations, orthopnea and PND.  Gastrointestinal: Negative for abdominal pain, diarrhea, nausea and vomiting.  Genitourinary: Negative for frequency and urgency.  Musculoskeletal: Positive for joint pain. Negative for back pain.  Neurological: Positive for weakness. Negative for sensory change, speech change and focal weakness.  Psychiatric/Behavioral: Negative for depression and hallucinations. The patient is not nervous/anxious.     MEDICATIONS AT HOME:   Prior to Admission medications   Medication Sig Start Date End Date Taking? Authorizing Provider  acetaminophen (TYLENOL) 500 MG tablet Take 1,000 mg by mouth every 8 (eight) hours as needed for mild pain or headache.   Yes [provider]  aspirin  EC 81 MG tablet Take 1 tablet (81 mg total) by mouth daily. 04/24/17  Yes Sudini, Alveta Heimlich, MD  budesonide (ENTOCORT EC) 3 MG 24 hr capsule Take 3 capsules (9 mg total) by mouth daily. Taper down to 3 mg Patient taking differently: Take 3 mg by mouth daily.  12/19/17  Yes Schnier, Dolores Lory, MD  Calcium Carbonate-Vitamin D (CALCIUM 600+D) 600-400 MG-UNIT tablet Take 1 tablet by mouth 2 (two) times daily.    Yes [provider]  clopidogrel (PLAVIX) 75 MG tablet TAKE 1 TABLET BY MOUTH EVERY DAY 02/22/18  Yes Schnier, Dolores Lory, MD  diclofenac sodium (VOLTAREN) 1 % GEL Apply 2 g topically 2 (two) times daily as needed (for arthritis in hands).  05/22/18  Yes [provider]  diltiazem (CARDIZEM CD) 240 MG 24 hr capsule Take 240 mg by mouth daily.   Yes [provider]  feeding supplement, ENSURE ENLIVE, (ENSURE ENLIVE) LIQD Take 237 mLs by mouth 2 (two) times daily between meals. 02/12/18  Yes Stegmayer, Joelene Millin A, PA-C  ferrous sulfate 325 (65 FE) MG tablet Take 325 mg by mouth daily with breakfast.   Yes [provider]  gemfibrozil (LOPID) 600 MG tablet Take 600 mg by mouth 2 (two) times daily before a meal.   Yes [provider]  HYDROcodone-acetaminophen (NORCO) 5-325 MG tablet Take 1-2 tablets by mouth every 6 (six) hours as needed for moderate pain or severe pain. 03/21/18  Yes Schnier, Dolores Lory, MD  hydroxychloroquine (PLAQUENIL) 200 MG tablet Take 200 mg by mouth 2 (two) times daily.    Yes [provider]  leflunomide (ARAVA) 20 MG tablet Take 20 mg by mouth daily.   Yes [provider]  lisinopril (PRINIVIL,ZESTRIL) 5 MG tablet Take 5 mg by mouth daily.   Yes [provider]  LORazepam (ATIVAN) 1 MG tablet Take 1 tablet (1 mg total) by mouth 2 (two) times daily. Patient taking differently: Take 1 mg by mouth every 8 (eight) hours as needed for anxiety.  02/12/18  Yes Henreitta Leber, MD  Multiple Vitamin (MULTIVITAMIN WITH  MINERALS) TABS tablet Take 1 tablet by mouth daily.   Yes [provider]  Multiple Vitamins-Minerals (PRESERVISION AREDS) CAPS Take 1 capsule by mouth 2 (two) times daily.   Yes [provider]  mupirocin ointment (BACTROBAN) 2 % Apply to affected area 2 times daily 06/22/18 06/22/19 Yes Pharr, Altamease Oiler, MD  Nutritional Supplements (NUTRITIONAL SHAKE HIGH PROTEIN) LIQD Take 237 mLs by mouth daily. Slim Fast protein   Yes [provider]  omeprazole (PRILOSEC) 20 MG capsule Take 20 mg by mouth 2 (two) times daily before a meal.    Yes [provider]  pravastatin (PRAVACHOL) 80 MG tablet Take 80 mg by mouth at bedtime.    Yes [provider]  predniSONE (DELTASONE) 5 MG tablet Take 5 mg by mouth daily with breakfast.    Yes [provider]  sertraline (ZOLOFT) 100 MG tablet Take 100 mg by mouth daily.   Yes [provider]  traMADol (ULTRAM) 50 MG tablet Take 1 tablet (50 mg total) by mouth every 6 (six) hours as needed for moderate pain. 02/11/18  Yes Stegmayer, Janalyn Harder, PA-C  apixaban (ELIQUIS) 5 MG TABS tablet Take 1 tablet (5 mg total) by mouth 2 (two) times daily. 04/24/17   Hillary Bow, MD  gabapentin (NEURONTIN) 300 MG capsule TAKE ONE CAPSULE BY MOUTH EVERY DAY. USE THIS MEDICATION FIRST Patient not taking: Reported on 06/24/2018 04/02/18   Stegmayer, Janalyn Harder, PA-C      VITAL SIGNS:  Blood pressure 128/69, pulse 93, temperature 98.9 F (37.2 C), temperature source Oral, resp. rate 14, height 5' 4"  (1.626 m), weight 52.3 kg, SpO2 99 %.  PHYSICAL EXAMINATION:  GENERAL:  77 y.o.-year-old patient lying in the bed with no acute distress.  EYES: Pupils equal, round, reactive to light and accommodation. No scleral icterus. Extraocular muscles intact.  HEENT: Head atraumatic, normocephalic. Oropharynx and nasopharynx clear.  NECK:  Supple, no jugular venous distention. No thyroid enlargement, no tenderness.  LUNGS: Normal  breath sounds bilaterally, no wheezing, rales,rhonchi or crepitation. No use of accessory muscles of respiration.  CARDIOVASCULAR: S1, S2 normal. No murmurs, rubs, or gallops.  ABDOMEN: Soft, nontender, nondistended. Bowel sounds present. No organomegaly or mass.  EXTREMITIES: below knee amputation stump appears normal. Right below knee amputation dressing present.  NEUROLOGIC: Cranial nerves II through XII are intact. Muscle strength 5/5 in all extremities. Sensation intact. Gait not checked.  PSYCHIATRIC: The patient is alert and oriented x 3.  SKIN: No obvious rash, lesion, or ulcer.   LABORATORY PANEL:   CBC Recent Labs  Lab 07/02/18 0533  WBC 13.7*  HGB 7.4*  HCT 24.7*  PLT 283   ------------------------------------------------------------------------------------------------------------------  Chemistries  Recent Labs  Lab 07/01/18 0544  NA 138  K 3.9  CL 101  CO2 28  GLUCOSE 95  BUN 17  CREATININE 0.53  CALCIUM 9.3  MG 1.7   ------------------------------------------------------------------------------------------------------------------  Cardiac Enzymes No results for input(s): TROPONINI in the last 168 hours. ------------------------------------------------------------------------------------------------------------------  RADIOLOGY:  No results found.  EKG:   Orders placed or performed during the hospital encounter of 12/05/17  . EKG 12-Lead  . EKG 12-Lead  . EKG 12-Lead  . EKG 12-Lead    IMPRESSION AND PLAN:   Melissa [name]  is a 77 y.o. female with a known history of severe peripheral vascular/peripheral arterial disease will was admitted about six days ago for right lower extremity gangrene. Patient underwent two revascularization procedures. She was placed on IV heparin drip. Her campaign continued without any improvement in the right foot and hands underwent right below knee amputation on number 16 2019 by Dr. Delana Meyer.  1. Paroxysmal atrial  fibrillation -will restart eliquis. Discussed with vascular PA -you Cardizem  2. Relative hypotension. Patient is getting a little bit of IV fluid bolus. -Consider blood transfusion of blood pressure stays on the lower side -patient has acute on chronic anemia  3. Right below knee amputation postop day #1 Per vascular  4 hyperlipidemia - on gemfibrozil, pravastatin  5.chronic anxiety zoloft  6. Anemia of chronic disease transfuse as needed hemoglobin today 7.4,  per vascular PA surgical site not bleeding  7. DVT prophylaxis on eliquis  D/w with daughter    All the records are reviewed and case discussed with Consulting provider. Management plans discussed with the patient, family and they are in agreement.  CODE STATUS: FULL  TOTAL TIME TAKING CARE OF THIS PATIENT: 50 minutes.    Fritzi Mandes M.D on 07/02/2018 at 4:45 PM  Between 7am to 6pm - Pager - [phone]  After 6pm go to [email or web address] - password [email or web address] [facility]  Office  [phone]  CC: Primary care Physician: [name], MD

## 2018-07-02 NOTE — Progress Notes (Signed)
[facility] Daily Progress Note   Subjective: 1 Day Post-Op: Right below-the-knee amputation  4 Day Post-Op: Introduction catheter into right lower extremity 3rd order catheter placement with additional third order, Contrast injection right lower extremity for distal runoff with additional 3rd order, Percutaneous transluminal angioplasty right anterior tibial, Percutaneous transluminal angioplasty associated with crosser atherectomy right posterior tibial unsuccessful with Star close closure right common femoral arteriotomy  6 Day Post-Op: Introduction catheter intorightlower extremity 3rd order catheter placementwith additional third order, Contrast injection rightlower extremity for distal runoff with additional 3rd order, CSI atherectomy with percutaneous transluminal angioplastyright anterior tibial artery, Percutaneous transluminal angioplastyright posterior tibial artery with Star close closure leftcommon femoral arteriotomy  Patient seen with daughter at bedside. Patient is very drowsy but arousable. No issues overnight.  Objective: Vitals:   07/02/18 0436 07/02/18 0900 07/02/18 0921 07/02/18 1043  BP: 100/61  105/71 106/61  Pulse: 75  (!) 104 (!) 103  Resp:   16 14  Temp: 99.2 F (37.3 C)  99.5 F (37.5 C)   TempSrc: Oral  Oral   SpO2: 100% (!) 89% 97% 96%  Weight:      Height:        Intake/Output Summary (Last 24 hours) at 07/02/2018 1513 Last data filed at 07/02/2018 1010 Gross per 24 hour  Intake 911.4 ml  Output 25 ml  Net 886.4 ml   Physical Exam: Drowsy, NAD CV: RRR Pulmonary: CTA Bilaterally Abdomen: Soft, Nontender, Nondistended Vascular:  Right Lower Extremity: Thigh soft. Knee flexible. OR dressing in place, clean and dry.  Left Lower Extremity: Thigh soft, Knee flexible at the joint. Shrinker sock in place.    Laboratory: CBC    Component Value Date/Time   WBC 13.7 (H) 07/02/2018 0533   HGB 7.4 (L) 07/02/2018 0533   HGB  11.6 (L) 06/16/2013 1031   HCT 24.7 (L) 07/02/2018 0533   HCT 34.8 (L) 06/16/2013 1031   PLT 283 07/02/2018 0533   PLT 226 06/16/2013 1031   BMET    Component Value Date/Time   NA 138 07/01/2018 0544   NA 139 06/16/2013 1031   K 3.9 07/01/2018 0544   K 3.8 06/16/2013 1031   CL 101 07/01/2018 0544   CL 105 06/16/2013 1031   CO2 28 07/01/2018 0544   CO2 30 06/16/2013 1031   GLUCOSE 95 07/01/2018 0544   GLUCOSE 96 06/16/2013 1031   BUN 17 07/01/2018 0544   BUN 23 (H) 06/16/2013 1031   CREATININE 0.53 07/01/2018 0544   CREATININE 0.58 (L) 06/16/2013 1031   CALCIUM 9.3 07/01/2018 0544   CALCIUM 9.7 06/16/2013 1031   GFRNONAA >60 07/01/2018 0544   GFRNONAA >60 06/16/2013 1031   GFRAA >60 07/01/2018 0544   GFRAA >60 06/16/2013 1031   Assessment/Planning: The patient is a 77 year old female status post 2 endovascular interventions to the right lower extremity, postop day 1 right below the knee amputation - Stable 1) Hbg 7.4 this AM. AM labs. Will monitor. 2) Patient very lethargic (which has happened in the past) with oxycodone / dilaudid, will change from dilaudid to morphine, keep oxycodone and add one to two days of toradol in an attempt to decrease the amount of breakthrough narcotics used 3) OT / PT 4) Consult medicine for assistance with Afib / Hypotension issues 5) Will bolus 500cc NS for hypotension  Discussed with Dr. Eber Hong Hellon Vaccarella PA-C 07/02/2018 3:13 PM

## 2018-07-02 NOTE — Plan of Care (Signed)
  Problem: Clinical Measurements: Goal: Ability to maintain clinical measurements within normal limits will improve Outcome: Progressing Goal: Respiratory complications will improve Outcome: Progressing   Problem: Pain Managment: Goal: General experience of comfort will improve Outcome: Progressing

## 2018-07-03 DIAGNOSIS — E43 Unspecified severe protein-calorie malnutrition: Secondary | ICD-10-CM

## 2018-07-03 LAB — CBC
HCT: 24.6 % — ABNORMAL LOW (ref 36.0–46.0)
Hemoglobin: 7.5 g/dL — ABNORMAL LOW (ref 12.0–15.0)
MCH: 30.6 pg (ref 26.0–34.0)
MCHC: 30.5 g/dL (ref 30.0–36.0)
MCV: 100.4 fL — ABNORMAL HIGH (ref 80.0–100.0)
Platelets: 275 10*3/uL (ref 150–400)
RBC: 2.45 MIL/uL — ABNORMAL LOW (ref 3.87–5.11)
RDW: 13.8 % (ref 11.5–15.5)
WBC: 15.5 10*3/uL — ABNORMAL HIGH (ref 4.0–10.5)
nRBC: 0 % (ref 0.0–0.2)

## 2018-07-03 LAB — BASIC METABOLIC PANEL
Anion gap: 9 (ref 5–15)
BUN: 11 mg/dL (ref 8–23)
CO2: 25 mmol/L (ref 22–32)
Calcium: 8.8 mg/dL — ABNORMAL LOW (ref 8.9–10.3)
Chloride: 99 mmol/L (ref 98–111)
Creatinine, Ser: 0.51 mg/dL (ref 0.44–1.00)
GFR calc Af Amer: >60 mL/min (ref >60)
GFR calc non Af Amer: >60 mL/min (ref >60)
Glucose, Bld: 107 mg/dL — ABNORMAL HIGH (ref 70–99)
Potassium: 3.6 mmol/L (ref 3.5–5.1)
Sodium: 133 mmol/L — ABNORMAL LOW (ref 135–145)

## 2018-07-03 LAB — MAGNESIUM: Magnesium: 1.6 mg/dL — ABNORMAL LOW (ref 1.7–2.4)

## 2018-07-03 LAB — SURGICAL PATHOLOGY

## 2018-07-03 MED ORDER — SODIUM CHLORIDE 1 G PO TABS
2.0000 g | ORAL_TABLET | Freq: Two times a day (BID) | ORAL | Status: AC
  Administered 2018-07-03 – 2018-07-04 (×2): 2 g via ORAL
  Filled 2018-07-03 (×2): qty 2

## 2018-07-03 MED ORDER — MAGNESIUM SULFATE 2 GM/50ML IV SOLN
2.0000 g | Freq: Once | INTRAVENOUS | Status: AC
  Administered 2018-07-03: 2 g via INTRAVENOUS
  Filled 2018-07-03: qty 50

## 2018-07-03 MED ORDER — VITAMIN C 500 MG PO TABS
250.0000 mg | ORAL_TABLET | Freq: Two times a day (BID) | ORAL | Status: DC
  Administered 2018-07-03 – 2018-07-11 (×17): 250 mg via ORAL
  Filled 2018-07-03 (×17): qty 1

## 2018-07-03 MED ORDER — POTASSIUM CHLORIDE CRYS ER 20 MEQ PO TBCR
20.0000 meq | EXTENDED_RELEASE_TABLET | Freq: Every day | ORAL | Status: AC
  Administered 2018-07-03: 20 meq via ORAL
  Filled 2018-07-03: qty 1

## 2018-07-03 MED ORDER — OXYCODONE HCL 5 MG PO TABS
5.0000 mg | ORAL_TABLET | Freq: Four times a day (QID) | ORAL | Status: DC | PRN
  Filled 2018-07-03: qty 2

## 2018-07-03 MED ORDER — OXYCODONE HCL 5 MG PO TABS
5.0000 mg | ORAL_TABLET | ORAL | Status: DC | PRN
  Administered 2018-07-03 – 2018-07-11 (×19): 5 mg via ORAL
  Filled 2018-07-03 (×18): qty 1

## 2018-07-03 NOTE — Progress Notes (Signed)
[facility]  Daily Progress Note   Subjective: 2 Day Post-Op: Right below-the-knee amputation  5 Day Post-Op: Introduction catheter intorightlower extremity 3rd order catheter placementwith additional third order, Contrast injection rightlower extremity for distal runoff with additional 3rd order, Percutaneous transluminal angioplastyright anterior tibial, Percutaneous transluminal angioplastyassociated with crosser atherectomy right posterior tibial unsuccessful with Star close closure rightcommon femoral arteriotomy  7 Day Post-Op:Introduction catheter intorightlower extremity 3rd order catheter placementwith additional third order,Contrast injection rightlower extremity for distal runoff with additional 3rd order,CSI atherectomy with percutaneous transluminal angioplastyright anterior tibial artery,Percutaneous transluminal angioplastyright posterior tibial arterywithStar close closure leftcommon femoral arteriotomy   Seen with daughter. Had conversation about why pain meds are being decreased (overnarcatized, unable to participate in therapy). States pain better today. Much more awake this evening.   Objective: Vitals:   07/03/18 0352 07/03/18 0745 07/03/18 1425 07/03/18 1700  BP: 114/63 (!) 115/51  117/60  Pulse: 97 (!) 105  96  Resp:      Temp: 98.2 F (36.8 C) 98.5 F (36.9 C)  98.3 F (36.8 C)  TempSrc: Oral Oral  Oral  SpO2: 96% 100%  99%  Weight:   50.6 kg   Height:        Intake/Output Summary (Last 24 hours) at 07/03/2018 1755 Last data filed at 07/03/2018 1355 Gross per 24 hour  Intake 880.68 ml  Output 1500 ml  Net -619.32 ml   Physical Exam: A&Ox3, NAD CV: RRR Pulmonary: CTA Bilaterally Abdomen: Soft, Nontender, Nondistended Vascular:  Right Lower Extremity: Thigh soft, knee joint flexible, OR dressing clean, dry and intact   Laboratory: CBC    Component Value Date/Time   WBC 15.5 (H) 07/03/2018 0333   HGB 7.5  (L) 07/03/2018 0333   HGB 11.6 (L) 06/16/2013 1031   HCT 24.6 (L) 07/03/2018 0333   HCT 34.8 (L) 06/16/2013 1031   PLT 275 07/03/2018 0333   PLT 226 06/16/2013 1031   BMET    Component Value Date/Time   NA 133 (L) 07/03/2018 0333   NA 139 06/16/2013 1031   K 3.6 07/03/2018 0333   K 3.8 06/16/2013 1031   CL 99 07/03/2018 0333   CL 105 06/16/2013 1031   CO2 25 07/03/2018 0333   CO2 30 06/16/2013 1031   GLUCOSE 107 (H) 07/03/2018 0333   GLUCOSE 96 06/16/2013 1031   BUN 11 07/03/2018 0333   BUN 23 (H) 06/16/2013 1031   CREATININE 0.51 07/03/2018 0333   CREATININE 0.58 (L) 06/16/2013 1031   CALCIUM 8.8 (L) 07/03/2018 0333   CALCIUM 9.7 06/16/2013 1031   GFRNONAA >60 07/03/2018 0333   GFRNONAA >60 06/16/2013 1031   GFRAA >60 07/03/2018 0333   GFRAA >60 06/16/2013 1031   Assessment/Planning: The patient is a 77 year old female status post 2 endovascular interventions to the right lower extremity, postop day 2 right below the knee amputation - Stable 1) H&H stable 2) Decreased pain meds for (overnarcatized, unable to participate in therapy). Patient expresses her understanding. 3) Encouraged to work with PT / OT tomorrow  Discussed with Dr. Eber Hong Marquetta Weiskopf PA-C 07/03/2018 5:55 PM

## 2018-07-03 NOTE — Progress Notes (Signed)
Initial Nutrition Assessment  DOCUMENTATION CODES:   Severe malnutrition in context of chronic illness  INTERVENTION:   Ensure Enlive po BID, each supplement provides 350 kcal and 20 grams of protein  MVI daily  Vitamin C 25m po BID  Magic cup TID with meals, each supplement provides 290 kcal and 9 grams of protein  Liberalize diet   NUTRITION DIAGNOSIS:   Severe Malnutrition related to chronic illness(COPD ) as evidenced by severe fat depletion, severe muscle depletion.  GOAL:   Patient will meet greater than or equal to 90% of their needs  MONITOR:   PO intake, Supplement acceptance, Labs, Weight trends, Skin, I & O's  REASON FOR ASSESSMENT:   Consult Assessment of nutrition requirement/status  ASSESSMENT:   77y.o. female with a known history of L BKA, severe peripheral vascular/peripheral arterial disease admitted for right lower extremity gangrene now s/p R BKA 12/16   Met with pt in room today. Pt reports poor appetite and oral intake pta. Pt documented to be eating only bites of meals. Pt is drinking some Ensure. Per chart, pt appears fairly weight stable pta. RD will add vitamins and supplements to help pt meet her estimated needs and support wound healing. RD will also liberalize pt's diet as a heart healthy diet restricts protein and pt not eating enough to exceed nutrient limits. Suspect pt at moderate refeeding risk; recommend monitor K, Mg and P labs once oral intake improves.    Medications reviewed and include: aspirin, oscal w/ D, colace, ferrous sulfate, MVI, protonix, KCl, prednisone, NaCl tabs  Labs reviewed: Na 133(L), Mg 1.6(L) Wbc- 15.5(H), Hgb 7.5(L), Hct 24.6(L)  NUTRITION - FOCUSED PHYSICAL EXAM:    Most Recent Value  Orbital Region  Moderate depletion  Upper Arm Region  Severe depletion  Thoracic and Lumbar Region  Severe depletion  Buccal Region  Moderate depletion  Temple Region  Moderate depletion  Clavicle Bone Region  Moderate  depletion  Clavicle and Acromion Bone Region  Moderate depletion  Scapular Bone Region  Moderate depletion  Dorsal Hand  Severe depletion  Patellar Region  Severe depletion  Anterior Thigh Region  Severe depletion  Posterior Calf Region  Unable to assess  Edema (RD Assessment)  Mild  Hair  Reviewed  Eyes  Reviewed  Mouth  Reviewed  Skin  Reviewed  Nails  Reviewed     Diet Order:   Diet Order            Diet Heart Room service appropriate? Yes; Fluid consistency: Thin  Diet effective now             EDUCATION NEEDS:   Education needs have been addressed  Skin:  Skin Assessment: Reviewed RN Assessment(ecchymosis, incision R leg )  Last BM:  pta  Height:   Ht Readings from Last 1 Encounters:  06/27/18 5' 4"  (1.626 m)    Weight:   Wt Readings from Last 1 Encounters:  07/03/18 50.6 kg    Ideal Body Weight:  (S) 48 kg(adjusted for bilateral BKA)  BMI:  Body mass index is 19.15 kg/m.  Estimated Nutritional Needs:   Kcal:  1200-1400kcal/day   Protein:  76-86g/day   Fluid:  1.2L/day   CKoleen DistanceMS, RD, LDN Pager #- [phone]#- [phone] Hours Pager: [phone]

## 2018-07-03 NOTE — Anesthesia Preprocedure Evaluation (Signed)
Anesthesia Evaluation  Patient identified by MRN, date of birth, ID band Patient awake    Reviewed: Allergy & Precautions, H&P , NPO status , Patient's Chart, lab work & pertinent test results, reviewed documented beta blocker date and time   Airway Mallampati: II  TM Distance: >3 FB Neck ROM: full    Dental  (+) Teeth Intact   Pulmonary neg pulmonary ROS, shortness of breath, COPD,    Pulmonary exam normal        Cardiovascular Exercise Tolerance: Poor hypertension, Pt. on medications + CAD and + Past MI  negative cardio ROS Normal cardiovascular exam+ dysrhythmias  Rate:Normal     Neuro/Psych PSYCHIATRIC DISORDERS Anxiety Depression negative neurological ROS  negative psych ROS   GI/Hepatic negative GI ROS, Neg liver ROS, GERD  ,  Endo/Other  negative endocrine ROS  Renal/GU negative Renal ROS  negative genitourinary   Musculoskeletal  (+) Arthritis ,   Abdominal   Peds  Hematology negative hematology ROS (+)   Anesthesia Other Findings   Reproductive/Obstetrics negative OB ROS                             Anesthesia Physical Anesthesia Plan  ASA: II  Anesthesia Plan: General LMA   Post-op Pain Management:    Induction:   PONV Risk Score and Plan:   Airway Management Planned:   Additional Equipment:   Intra-op Plan:   Post-operative Plan:   Informed Consent: I have reviewed the patients History and Physical, chart, labs and discussed the procedure including the risks, benefits and alternatives for the proposed anesthesia with the patient or authorized representative who has indicated his/her understanding and acceptance.     Plan Discussed with: CRNA  Anesthesia Plan Comments:         Anesthesia Quick Evaluation

## 2018-07-03 NOTE — Anesthesia Postprocedure Evaluation (Signed)
Anesthesia Post Note  Patient: Melissa [name]  Procedure(s) Performed: AMPUTATION BELOW KNEE (Right Leg Lower)  Patient location during evaluation: PACU Anesthesia Type: General Level of consciousness: awake and alert Pain management: pain level controlled Vital Signs Assessment: post-procedure vital signs reviewed and stable Respiratory status: spontaneous breathing, nonlabored ventilation, respiratory function stable and patient connected to nasal cannula oxygen Cardiovascular status: blood pressure returned to baseline and stable Postop Assessment: no apparent nausea or vomiting Anesthetic complications: no     Last Vitals:  Vitals:   07/03/18 0745 07/03/18 1700  BP: (!) 115/51 117/60  Pulse: (!) 105 96  Resp:    Temp: 36.9 C 36.8 C  SpO2: 100% 99%    Last Pain:  Vitals:   07/03/18 1700  TempSrc: Oral  PainSc:                  Molli Barrows

## 2018-07-03 NOTE — Progress Notes (Signed)
PT Cancellation Note  Patient Details Name: Melissa [name] MRN: [id number] DOB: 1941/07/14   Cancelled Treatment:    Reason Eval/Treat Not Completed: Other (comment).  PT consult received.  Chart reviewed.  Upon PT entering pt's room, pt noted to be sound asleep with mouth open.  Therapist attempted to wake pt up a few times and pt able to open eyes each time but then would fall quickly back asleep.  Nurse notified.  Will re-attempt PT evaluation at a later date/time.  Leitha Bleak, PT 07/03/18, 9:45 AM [phone]

## 2018-07-03 NOTE — Progress Notes (Signed)
[facility] [place] NAME: Melissa [name]    MR#:  [id number]  DATE OF BIRTH:  06-Sep-1940  SUBJECTIVE:  CHIEF COMPLAINT:  No chief complaint on file.  Patient is drowsy and sleeping after pain medication. REVIEW OF SYSTEMS:  Review of Systems  Unable to perform ROS: Medical condition    DRUG ALLERGIES:   Allergies  Allergen Reactions  . Lodine [Etodolac] Hives and Itching  . Methotrexate Derivatives Other (See Comments)    Flu like symptoms  . Nitrofuran Derivatives Nausea And Vomiting and Other (See Comments)    Made sicker   . Levaquin [Levofloxacin] Itching, Rash and Other (See Comments)    Muscle pain  . Norvasc [Amlodipine] Other (See Comments)    Unknown   . Pentasa [Mesalamine Er] Other (See Comments)    Unknown   . Sulfa Antibiotics Itching and Rash  . Vioxx [Rofecoxib] Other (See Comments)    Unknown    VITALS:  Blood pressure 117/60, pulse 96, temperature 98.3 F (36.8 C), temperature source Oral, resp. rate 14, height 5' 4"  (1.626 m), weight 50.6 kg, SpO2 99 %. PHYSICAL EXAMINATION:  Physical Exam Constitutional:      General: She is not in acute distress. HENT:     Head: Normocephalic.  Eyes:     General: No scleral icterus.    Conjunctiva/sclera: Conjunctivae normal.  Neck:     Musculoskeletal: Normal range of motion and neck supple.     Vascular: No JVD.     Trachea: No tracheal deviation.  Cardiovascular:     Rate and Rhythm: Normal rate and regular rhythm.     Heart sounds: Normal heart sounds. No murmur. No gallop.   Pulmonary:     Effort: Pulmonary effort is normal. No respiratory distress.     Breath sounds: Normal breath sounds. No wheezing or rales.  Abdominal:     General: Bowel sounds are normal. There is no distension.     Palpations: Abdomen is soft.     Tenderness: There is no abdominal tenderness. There is no rebound.  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  Skin:  Findings: No erythema or rash.  Neurological:     Mental Status: She is alert.     Comments: Drowsy and sleeping, unable to exam.    LABORATORY PANEL:  Female CBC Recent Labs  Lab 07/03/18 0333  WBC 15.5*  HGB 7.5*  HCT 24.6*  PLT 275   ------------------------------------------------------------------------------------------------------------------ Chemistries  Recent Labs  Lab 07/03/18 0333  NA 133*  K 3.6  CL 99  CO2 25  GLUCOSE 107*  BUN 11  CREATININE 0.51  CALCIUM 8.8*  MG 1.6*   RADIOLOGY:  No results found. ASSESSMENT AND PLAN:   Melissa [name]  is a 77 y.o. female with a known history of severe peripheral vascular/peripheral arterial disease will was admitted about six days ago for right lower extremity gangrene. Patient underwent two revascularization procedures. She was placed on IV heparin drip. Her campaign continued without any improvement in the right foot and hands underwent right below knee amputation on number [id number] 2019 by Dr. Delana Meyer.  1. Paroxysmal atrial fibrillation Restarted eliquis and continue Cardizem  2. Relative hypotension.  Improved with IV fluid support, blood pressure is soft.  3. Right below knee amputation postop day #2 Per vascular  4 hyperlipidemia - on gemfibrozil, pravastatin  5.chronic anxiety zoloft  6. Anemia of chronic disease transfuse as needed hemoglobin  today 7.5, per vascular PA surgical site not bleeding  All the records are reviewed and case discussed with Care Management/Social Worker. Management plans discussed with the patient's daughter and they are in agreement.  CODE STATUS: Full Code  TOTAL TIME TAKING CARE OF THIS PATIENT: 25 minutes.   More than 50% of the time was spent in counseling/coordination of care: YES  POSSIBLE D/C IN ? DAYS, DEPENDING ON CLINICAL CONDITION.   Demetrios Loll M.D on 07/03/2018 at 6:04 PM  Between 7am to 6pm - Pager - [phone]  After 6pm go to [email or web address] - [facility]

## 2018-07-03 NOTE — Progress Notes (Signed)
OT Cancellation Note  Patient Details Name: Melissa [name] MRN: [id number] DOB: 06/26/1941   Cancelled Treatment:    Reason Eval/Treat Not Completed: Fatigue/lethargy limiting ability to participate. Order received, chart reviewed. Spoke with PT who recently attempted to evaluate pt. Per PT, pt sleeping soundly, difficult to remain awake enough to participate. Will hold OT evaluation and re-attempt at later date/time as pt is better able to actively participate and is medically appropriate.   Jeni Salles, MPH, MS, OTR/L ascom [phone] 07/03/18, 10:54 AM

## 2018-07-03 NOTE — Progress Notes (Signed)
[facility] Daily Progress Note  Vascular Surgery Communication Note  Due to the patient being extremely lethargic from being administered narcotics and unable to participate in physical therapy and occupational therapy after discussion with Dr. Delana Meyer will discontinue her morphine and order oxycodone every 6 hours (see OT / PT notes from today).  The patient is at high risk for developing postoperative complications in such a lethargic state.  Discussed with Dr. Eber Hong Morse Brueggemann PA-C 07/03/2018 2:59 PM

## 2018-07-04 ENCOUNTER — Inpatient Hospital Stay: Payer: Medicare HMO

## 2018-07-04 DIAGNOSIS — Z9862 Peripheral vascular angioplasty status: Secondary | ICD-10-CM

## 2018-07-04 DIAGNOSIS — Z89511 Acquired absence of right leg below knee: Secondary | ICD-10-CM

## 2018-07-04 LAB — BASIC METABOLIC PANEL
Anion gap: 9 (ref 5–15)
BUN: 21 mg/dL (ref 8–23)
CO2: 25 mmol/L (ref 22–32)
Calcium: 9 mg/dL (ref 8.9–10.3)
Chloride: 103 mmol/L (ref 98–111)
Creatinine, Ser: 0.46 mg/dL (ref 0.44–1.00)
GFR calc Af Amer: >60 mL/min (ref >60)
GFR calc non Af Amer: >60 mL/min (ref >60)
Glucose, Bld: 208 mg/dL — ABNORMAL HIGH (ref 70–99)
Potassium: 4.2 mmol/L (ref 3.5–5.1)
Sodium: 137 mmol/L (ref 135–145)

## 2018-07-04 LAB — CBC
HCT: 26 % — ABNORMAL LOW (ref 36.0–46.0)
Hemoglobin: 7.9 g/dL — ABNORMAL LOW (ref 12.0–15.0)
MCH: 30.7 pg (ref 26.0–34.0)
MCHC: 30.4 g/dL (ref 30.0–36.0)
MCV: 101.2 fL — ABNORMAL HIGH (ref 80.0–100.0)
Platelets: 321 10*3/uL (ref 150–400)
RBC: 2.57 MIL/uL — ABNORMAL LOW (ref 3.87–5.11)
RDW: 13.9 % (ref 11.5–15.5)
WBC: 17 10*3/uL — AB (ref 4.0–10.5)
nRBC: 0 % (ref 0.0–0.2)

## 2018-07-04 LAB — TROPONIN I
Troponin I: 0.04 ng/mL (ref <0.03)
Troponin I: 0.03 ng/mL (ref <0.03)
Troponin I: 0.04 ng/mL (ref <0.03)

## 2018-07-04 LAB — URINALYSIS, COMPLETE (UACMP) WITH MICROSCOPIC
Bacteria, UA: NONE SEEN
Bilirubin Urine: NEGATIVE
GLUCOSE, UA: NEGATIVE mg/dL
HGB URINE DIPSTICK: NEGATIVE
Ketones, ur: NEGATIVE mg/dL
LEUKOCYTES UA: NEGATIVE
Nitrite: NEGATIVE
Protein, ur: NEGATIVE mg/dL
Specific Gravity, Urine: 1.023 (ref 1.005–1.030)
pH: 6 (ref 5.0–8.0)

## 2018-07-04 LAB — MAGNESIUM: Magnesium: 2.1 mg/dL (ref 1.7–2.4)

## 2018-07-04 MED ORDER — GABAPENTIN 300 MG PO CAPS
600.0000 mg | ORAL_CAPSULE | Freq: Every day | ORAL | Status: DC
  Administered 2018-07-04 – 2018-07-10 (×7): 600 mg via ORAL
  Filled 2018-07-04 (×7): qty 2

## 2018-07-04 MED ORDER — LORAZEPAM 1 MG PO TABS
1.0000 mg | ORAL_TABLET | Freq: Four times a day (QID) | ORAL | Status: DC | PRN
  Administered 2018-07-05: 1 mg via ORAL
  Filled 2018-07-04: qty 1

## 2018-07-04 MED ORDER — GABAPENTIN 300 MG PO CAPS
300.0000 mg | ORAL_CAPSULE | Freq: Three times a day (TID) | ORAL | Status: DC
  Administered 2018-07-05 – 2018-07-11 (×17): 300 mg via ORAL
  Filled 2018-07-04 (×19): qty 1

## 2018-07-04 NOTE — Progress Notes (Signed)
OT Cancellation Note  Patient Details Name: Melissa [name] MRN: [id number] DOB: Jan 30, 1941   Cancelled Treatment:    Reason Eval/Treat Not Completed: Medical issues which prohibited therapy Order received for OT evaluation and chart reviewed.  Patient noted with mild elevation in troponin after rapid response this AM; pending follow up draw q6.  Will hold initiation of exertional activity until lab work cleared and patient appropriate for activity. Will attempt again tomorrow.   Chrys Racer, OTR/L ascom [phone] 07/04/18, 3:06 PM

## 2018-07-04 NOTE — Progress Notes (Signed)
Family Meeting Note  Advance Directive:no  Today a meeting took place with the Patient.and daughter   The following clinical team members were present during this meeting:MD  The following were discussed:Patient's diagnosis: PAF B/l AKA PAD severe , Patient's progosis: Unable to determine and Goals for treatment: Full Code  Additional follow-up to be provided: FULL CODE chaplain for AD  Time spent during discussion:16 minutes  Melissa Paradis, MD

## 2018-07-04 NOTE — Care Management Important Message (Signed)
Copy of signed IM left with patient in room.  

## 2018-07-04 NOTE — Progress Notes (Signed)
Chaplain responded to an OR for an AD. Pt was preparing to go to the bathroom. Daughter was at the bedside and requested booklet be left with her for their review. Chaplain told to call with questions and concerns   07/04/18 1300  Clinical Encounter Type  Visited With Patient not available  Visit Type Initial  Referral From Physician  Spiritual Encounters  Spiritual Needs Brochure

## 2018-07-04 NOTE — Consult Note (Signed)
[facility] Consultation Note  Patient ID: Melissa [name], MRN: [id number], DOB/AGE: [birth year] 77 y.o. Admit date: 06/26/2018   Date of Consult: 07/04/2018 Primary Physician: [name], MD Primary Cardiologist: Fath  Chief Complaint: No chief complaint on file.  Reason for Consult: Atrial fibrillation  HPI: 77 y.o. female with known diabetes essential hypertension mixed hyperlipidemia with significant peripheral vascular disease status post peripheral vascular disease surgery and amputation.  In the perioperative.  The patient did have some atrial fibrillation consistent with her previous paroxysmal nonvalvular atrial fibrillation.  The patient previously was on appropriate medication management for this including heart rate control with diltiazem.  She has not had any significant new symptoms of congestive heart failure or angina and heart rate has been reasonably controlled.  Since then the patient now has spontaneous conversion to normal sinus rhythm with some pre-atrial contractions.  There is been no other significant rhythm disturbances heart block at this time.  The patient has had no significant major bleeding complications from surgery.  Past Medical History:  Diagnosis Date  . Anemia 12/2017   blood transfusion after amputation  . Anxiety   . Arthritis    rheumatoid  . Ataxia   . Chronic ulcer of right great toe ([place]) 2019   slowly healing  . COPD (chronic obstructive pulmonary disease) ([place])   . Coronary artery disease 2018   complete blockage in one vessel, 40 % in another, 25% in 3rd vessell  . Depression   . Diverticulosis   . Dysrhythmia    atrial fib  . Excessive falling   . GERD (gastroesophageal reflux disease)    ulcerative colitis  . Hypercholesteremia   . Hyperlipidemia   . Hypertension   . IBS (irritable bowel syndrome)   . MRSA (methicillin resistant staph aureus) culture positive 11/2017   patient unaware of this diagnosis, was  never informed or treated  . Myocardial infarction ([place]) 08/2017   had angio but no stents  . Osteoarthritis   . Psoriasis   . Raynaud's disease without gangrene   . Raynaud's disease without gangrene   . Shortness of breath dyspnea    with exertion  . 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 ABDOMINAL HYSTERECTOMY  1980  . AMPUTATION Left 12/12/2017   Procedure: AMPUTATION BELOW KNEE;  Surgeon: Katha Cabal, MD;  Location: [facility];  Service: Vascular;  Laterality: Left;  . AMPUTATION Left 02/08/2018   Procedure: AMPUTATION BELOW KNEE ( REVISION );  Surgeon: Katha Cabal, MD;  Location: [facility];  Service: Vascular;  Laterality: Left;  . AMPUTATION Right 07/01/2018   Procedure: AMPUTATION BELOW KNEE;  Surgeon: Katha Cabal, MD;  Location: [facility];  Service: Vascular;  Laterality: Right;  . APPENDECTOMY  1956  . APPLICATION OF WOUND VAC Left 01/25/2018   Procedure: APPLICATION OF WOUND VAC;  Surgeon: Katha Cabal, MD;  Location: [facility];  Service: Vascular;  Laterality: Left;  . APPLICATION OF WOUND VAC Left 03/08/2018   Procedure: APPLICATION OF WOUND VAC;  Surgeon: Katha Cabal, MD;  Location: [facility];  Service: Vascular;  Laterality: Left;  . BACK SURGERY  2013   Spinal fusion with rods and screws  . CHOLECYSTECTOMY    . COLONOSCOPY WITH PROPOFOL N/A 01/29/2017   Procedure: COLONOSCOPY WITH PROPOFOL;  Surgeon: Manya Silvas, MD;  Location: [facility];  Service: Endoscopy;  Laterality: N/A;  . ESOPHAGOGASTRODUODENOSCOPY (EGD) WITH PROPOFOL N/A 01/29/2017   Procedure: ESOPHAGOGASTRODUODENOSCOPY (EGD) WITH PROPOFOL;  Surgeon: Manya Silvas, MD;  Location[facility];  Service: Endoscopy;  Laterality: N/A;  . EYE SURGERY Bilateral 2012   Cataract Extraction with IOL  . KNEE ARTHROSCOPY Left 2009   partial lateral meniscectomy,  debridement, excision of Baker's cyst  . LEFT HEART CATH AND CORONARY ANGIOGRAPHY N/A 04/23/2017   Procedure: LEFT HEART CATH AND CORONARY ANGIOGRAPHY;  Surgeon: Teodoro Spray, MD;  Location: [place] [facility];  Service: Cardiovascular;  Laterality: N/A;  . LEFT HEART CATH AND CORONARY ANGIOGRAPHY Left 09/07/2017   Procedure: LEFT HEART CATH AND CORONARY ANGIOGRAPHY;  Surgeon: Teodoro Spray, MD;  Location: [place] [facility];  Service: Cardiovascular;  Laterality: Left;  . LOWER EXTREMITY ANGIOGRAPHY Left 11/27/2017   Procedure: LOWER EXTREMITY ANGIOGRAPHY;  Surgeon: Katha Cabal, MD;  Location: [place] [facility];  Service: Cardiovascular;  Laterality: Left;  . LOWER EXTREMITY ANGIOGRAPHY Left 12/05/2017   Procedure: LOWER EXTREMITY ANGIOGRAPHY;  Surgeon: Katha Cabal, MD;  Location: [place] [facility];  Service: Cardiovascular;  Laterality: Left;  . LOWER EXTREMITY ANGIOGRAPHY Right 06/21/2018   Procedure: LOWER EXTREMITY ANGIOGRAPHY;  Surgeon: Katha Cabal, MD;  Location: [place] [facility];  Service: Cardiovascular;  Laterality: Right;  . LOWER EXTREMITY ANGIOGRAPHY Right 06/26/2018   Procedure: LOWER EXTREMITY ANGIOGRAPHY;  Surgeon: Katha Cabal, MD;  Location: [place] [facility];  Service: Cardiovascular;  Laterality: Right;  . LOWER EXTREMITY ANGIOGRAPHY Right 06/28/2018   Procedure: Lower Extremity Angiography;  Surgeon: Katha Cabal, MD;  Location: [place] [facility];  Service: Cardiovascular;  Laterality: Right;  . ROTATOR CUFF REPAIR Right 1990  . TOTAL KNEE ARTHROPLASTY Left 04/04/2016   Procedure: TOTAL KNEE ARTHROPLASTY;  Surgeon: Hessie Knows, MD;  Location: [facility];  Service: Orthopedics;  Laterality: Left;  . WOUND DEBRIDEMENT Left 01/25/2018   Procedure: DEBRIDEMENT WOUND;  Surgeon: Katha Cabal, MD;  Location: [facility];  Service: Vascular;  Laterality: Left;  . WOUND DEBRIDEMENT Left 03/08/2018   Procedure: DEBRIDEMENT WOUND;   Surgeon: Katha Cabal, MD;  Location: [facility];  Service: Vascular;  Laterality: Left;     Home Meds: Prior to Admission medications   Medication Sig Start Date End Date Taking? Authorizing Provider  acetaminophen (TYLENOL) 500 MG tablet Take 1,000 mg by mouth every 8 (eight) hours as needed for mild pain or headache.   Yes [provider]  aspirin EC 81 MG tablet Take 1 tablet (81 mg total) by mouth daily. 04/24/17  Yes Sudini, Alveta Heimlich, MD  budesonide (ENTOCORT EC) 3 MG 24 hr capsule Take 3 capsules (9 mg total) by mouth daily. Taper down to 3 mg Patient taking differently: Take 3 mg by mouth daily.  12/19/17  Yes Schnier, Dolores Lory, MD  Calcium Carbonate-Vitamin D (CALCIUM 600+D) 600-400 MG-UNIT tablet Take 1 tablet by mouth 2 (two) times daily.    Yes [provider]  clopidogrel (PLAVIX) 75 MG tablet TAKE 1 TABLET BY MOUTH EVERY DAY 02/22/18  Yes Schnier, Dolores Lory, MD  diclofenac sodium (VOLTAREN) 1 % GEL Apply 2 g topically 2 (two) times daily as needed (for arthritis in hands).  05/22/18  Yes [provider]  diltiazem (CARDIZEM CD) 240 MG 24 hr capsule Take 240 mg by mouth daily.   Yes [provider]  feeding supplement, ENSURE ENLIVE, (ENSURE ENLIVE) LIQD Take 237 mLs by  mouth 2 (two) times daily between meals. 02/12/18  Yes Stegmayer, Joelene Millin A, PA-C  ferrous sulfate 325 (65 FE) MG tablet Take 325 mg by mouth daily with breakfast.   Yes [provider]  gemfibrozil (LOPID) 600 MG tablet Take 600 mg by mouth 2 (two) times daily before a meal.   Yes [provider]  HYDROcodone-acetaminophen (NORCO) 5-325 MG tablet Take 1-2 tablets by mouth every 6 (six) hours as needed for moderate pain or severe pain. 03/21/18  Yes Schnier, Dolores Lory, MD  hydroxychloroquine (PLAQUENIL) 200 MG tablet Take 200 mg by mouth 2 (two) times daily.    Yes [provider]  leflunomide (ARAVA) 20 MG tablet Take 20 mg by mouth daily.   Yes [provider]  lisinopril (PRINIVIL,ZESTRIL) 5 MG tablet Take 5 mg by mouth daily.   Yes [provider]  LORazepam (ATIVAN) 1 MG tablet Take 1 tablet (1 mg total) by mouth 2 (two) times daily. Patient taking differently: Take 1 mg by mouth every 8 (eight) hours as needed for anxiety.  02/12/18  Yes Henreitta Leber, MD  Multiple Vitamin (MULTIVITAMIN WITH MINERALS) TABS tablet Take 1 tablet by mouth daily.   Yes [provider]  Multiple Vitamins-Minerals (PRESERVISION AREDS) CAPS Take 1 capsule by mouth 2 (two) times daily.   Yes [provider]  mupirocin ointment (BACTROBAN) 2 % Apply to affected area 2 times daily 06/22/18 06/22/19 Yes Pharr, Altamease Oiler, MD  Nutritional Supplements (NUTRITIONAL SHAKE HIGH PROTEIN) LIQD Take 237 mLs by mouth daily. Slim Fast protein   Yes [provider]  omeprazole (PRILOSEC) 20 MG capsule Take 20 mg by mouth 2 (two) times daily before a meal.    Yes [provider]  pravastatin (PRAVACHOL) 80 MG tablet Take 80 mg by mouth at bedtime.    Yes [provider]  predniSONE (DELTASONE) 5 MG tablet Take 5 mg by mouth daily with breakfast.    Yes [provider]  sertraline (ZOLOFT) 100 MG tablet Take 100 mg by mouth daily.   Yes [provider]  traMADol (ULTRAM) 50 MG tablet Take 1 tablet (50 mg total) by mouth every 6 (six) hours as needed for moderate pain. 02/11/18  Yes Stegmayer, Janalyn Harder, PA-C  apixaban (ELIQUIS) 5 MG TABS tablet Take 1 tablet (5 mg total) by mouth 2 (two) times daily. 04/24/17   Hillary Bow, MD  gabapentin (NEURONTIN) 300 MG capsule TAKE ONE CAPSULE BY MOUTH EVERY DAY. USE THIS MEDICATION FIRST Patient not taking: Reported on 06/24/2018 04/02/18   Stegmayer, Janalyn Harder, PA-C    Inpatient Medications:  . apixaban  5 mg Oral BID  . aspirin EC  81 mg Oral Daily  . budesonide  9 mg Oral Daily  . calcium-vitamin D  1 tablet Oral BID  . diltiazem  240 mg Oral Daily  .  docusate sodium  100 mg Oral BID  . feeding supplement (ENSURE ENLIVE)  237 mL Oral BID BM  . ferrous sulfate  325 mg Oral Q breakfast  . gabapentin  300 mg Oral TID  . gemfibrozil  600 mg Oral BID AC  . hydroxychloroquine  200 mg Oral BID  . leflunomide  20 mg Oral Daily  . lisinopril  10 mg Oral Daily  . mouth rinse  15 mL Mouth Rinse BID  . multivitamin with minerals  1 tablet Oral Daily  . mupirocin ointment   Topical Daily  . pantoprazole  40 mg Oral Daily  .  pravastatin  80 mg Oral QHS  . predniSONE  5 mg Oral Q breakfast  . sertraline  100 mg Oral Daily  . sodium chloride flush  3 mL Intravenous Q12H  . vitamin C  250 mg Oral BID   . sodium chloride      Allergies:  Allergies  Allergen Reactions  . Lodine [Etodolac] Hives and Itching  . Methotrexate Derivatives Other (See Comments)    Flu like symptoms  . Nitrofuran Derivatives Nausea And Vomiting and Other (See Comments)    Made sicker   . Levaquin [Levofloxacin] Itching, Rash and Other (See Comments)    Muscle pain  . Norvasc [Amlodipine] Other (See Comments)    Unknown   . Pentasa [Mesalamine Er] Other (See Comments)    Unknown   . Sulfa Antibiotics Itching and Rash  . Vioxx [Rofecoxib] Other (See Comments)    Unknown     Social History   Socioeconomic History  . Marital status: Married    Spouse name: Juanda Crumble  . Number of children: 3  . Years of education: Not on file  . Highest education level: 9th grade  Occupational History  . Not on file  Social Needs  . Financial resource strain: Not very hard  . Food insecurity:    Worry: Never true    Inability: Never true  . Transportation needs:    Medical: No    Non-medical: Not on file  Tobacco Use  . Smoking status: Never Smoker  . Smokeless tobacco: Never Used  Substance and Sexual Activity  . Alcohol use: No  . Drug use: No  . Sexual activity: Not Currently  Lifestyle  . Physical activity:    Days per week: 0 days    Minutes per session: Not  on file  . Stress: Only a little  Relationships  . Social connections:    Talks on phone: More than three times a week    Gets together: Once a week    Attends religious service: More than 4 times per year    Active member of club or organization: No    Attends meetings of clubs or organizations: Never    Relationship status: Married  . Intimate partner violence:    Fear of current or ex partner: No    Emotionally abused: No    Physically abused: No    Forced sexual activity: No  Other Topics Concern  . Not on file  Social History Narrative  . Not on file     Family History  Problem Relation Age of Onset  . CAD Mother   . CAD Father   . Alzheimer's disease Sister   . CAD Sister   . CAD Brother   . Breast cancer Neg Hx      Review of Systems Positive for leg pain shortness of breath Negative for: General:  chills, fever, night sweats or weight changes.  Cardiovascular: PND orthopnea syncope dizziness  Dermatological skin lesions rashes Respiratory: Cough congestion Urologic: Frequent urination urination at night and hematuria Abdominal: negative for nausea, vomiting, diarrhea, bright red blood per rectum, melena, or hematemesis Neurologic: negative for visual changes, and/or hearing changes  All other systems reviewed and are otherwise negative except as noted above.  Labs: Recent Labs    07/04/18 1107  TROPONINI 0.04*   Lab Results  Component Value Date   WBC 17.0 (H) 07/04/2018   HGB 7.9 (L) 07/04/2018   HCT 26.0 (L) 07/04/2018   MCV 101.2 (H) 07/04/2018  PLT 321 07/04/2018    Recent Labs  Lab 07/04/18 1107  NA 137  K 4.2  CL 103  CO2 25  BUN 21  CREATININE 0.46  CALCIUM 9.0  GLUCOSE 208*   Lab Results  Component Value Date   CHOL 156 04/19/2017   HDL 24 (L) 04/19/2017   LDLCALC 84 04/19/2017   TRIG 239 (H) 04/19/2017   No results found for: DDIMER  Radiology/Studies:  Dg Chest Port 1 View  Result Date: 07/04/2018 CLINICAL DATA:   Chest pain EXAM: PORTABLE CHEST 1 VIEW COMPARISON:  12/14/2017 FINDINGS: Heart is normal size. Chronic increased markings throughout the lungs, likely scarring. No effusions or confluent opacities. No acute bony abnormality. IMPRESSION: Chronic changes.  No active disease. Electronically Signed   By: Rolm Baptise M.D.   On: 07/04/2018 11:07   Vas [place] Burnard Bunting With/wo Tbi  Result Date: 06/17/2018 LOWER EXTREMITY DOPPLER STUDY Indications: Peripheral artery disease, and Left BKA 12/12/2017              No inervention on right. New Right great toe soreness.  Performing Technologist: Concha Norway RVT  Examination Guidelines: A complete evaluation includes at minimum, Doppler waveform signals and systolic blood pressure reading at the level of bilateral brachial, anterior tibial, and posterior tibial arteries, when vessel segments are accessible. Bilateral testing is considered an integral part of a complete examination. Photoelectric Plethysmograph (PPG) waveforms and toe systolic pressure readings are included as required and additional duplex testing as needed. Limited examinations for reoccurring indications may be performed as noted.  ABI Findings: +---------+------------------+-----+----------+--------+ Right    Rt Pressure (mmHg)IndexWaveform  Comment  +---------+------------------+-----+----------+--------+ Brachial 147                                       +---------+------------------+-----+----------+--------+ CFA                             triphasic          +---------+------------------+-----+----------+--------+ Popliteal                       triphasic          +---------+------------------+-----+----------+--------+ ATA                             monophasicNC       +---------+------------------+-----+----------+--------+ PTA                             monophasicNC       +---------+------------------+-----+----------+--------+ Great Toe32                0.22 Abnormal            +---------+------------------+-----+----------+--------+ +-------+-----------+-----------+------------+------------+ ABI/TBIToday's ABIToday's TBIPrevious ABIPrevious TBI +-------+-----------+-----------+------------+------------+ Right  NonComp    .32                                 +-------+-----------+-----------+------------+------------+ TOES Findings: +----------+---------------+--------+-------+ Right ToesPressure (mmHg)WaveformComment +----------+---------------+--------+-------+ 1st Digit                Abnormal        +----------+---------------+--------+-------+ 2nd Digit                Abnormal        +----------+---------------+--------+-------+  3rd Digit                Abnormal        +----------+---------------+--------+-------+ 4th Digit                Abnormal        +----------+---------------+--------+-------+ 5th Digit                Abnormal        +----------+---------------+--------+-------+  Left BKA.  Summary: Right: Resting right ankle-brachial index indicates noncompressible right lower extremity arteries.The right toe-brachial index is abnormal. Added duplex imaging to further evaluate flow. Moderate atherosclerosis throughout. Patent vessels with no significant stenosis throughout.  *See table(s) above for measurements and observations.  Electronically signed by [name] MD on 06/17/2018 at 4:51:54 PM.    Final     EKG: Normal sinus rhythm with nonspecific ST changes  Weights: Filed Weights   06/30/18 0418 07/01/18 0345 07/03/18 1425  Weight: 52.4 kg 52.3 kg 50.6 kg     Physical Exam: Blood pressure 92/73, pulse (!) 119, temperature 98.2 F (36.8 C), temperature source Oral, resp. rate 17, height 5' 4"  (1.626 m), weight 50.6 kg, SpO2 99 %. Body mass index is 19.15 kg/m. General: Well developed, well nourished, in no acute distress. Head eyes ears nose throat: Normocephalic, atraumatic, sclera non-icteric, no  xanthomas, nares are without discharge. No apparent thyromegaly and/or mass  Lungs: Normal respiratory effort.  no wheezes, no rales, few rhonchi.  Heart: Irregular with normal S1 S2. no murmur gallop, no rub, PMI is normal size and placement, carotid upstroke normal without bruit, jugular venous pressure is normal Abdomen: Soft, non-tender, non-distended with normoactive bowel sounds. No hepatomegaly. No rebound/guarding. No obvious abdominal masses. Abdominal aorta is normal size without bruit Extremities: No edema. no cyanosis, no clubbing, no ulcers  Peripheral : 2+ bilateral upper extremity pulses, 2+ bilateral femoral pulses, with lower extremity amputation  neuro: Alert and oriented. No facial asymmetry. No focal deficit. Moves all extremities spontaneously. Musculoskeletal: Normal muscle tone without kyphosis Psych:  Responds to questions appropriately with a normal affect.    Assessment: 77 year old female with diabetes with major complications with peripheral vascular disease hyperlipidemia hypertension status post amputation and surgery with paroxysmal nonvalvular atrial fibrillation with controlled ventricular rate on appropriate medication management without evidence of heart failure or myocardial infarction  Plan: 1.  Continue supportive care for postsurgical concerns after peripheral vascular intervention 2.  No restrictions to rehabilitation 3.  Continue heart rate control and maintenance of normal sinus rhythm with diltiazem without change 4.  No further cardiac diagnostics necessary at this time due to no heart failure or infarction 5.  Reinstate Eliquis 5 mg twice per day for further risk reduction in stroke with atrial fibrillation as soon as possible after surgery without increasing risks of bleeding complications  Signed, Corey Skains M.D. [facility] 07/04/2018, 12:54 PM

## 2018-07-04 NOTE — Progress Notes (Signed)
PT Cancellation Note  Patient Details Name: Melissa [name] MRN: [id number] DOB: 1941-05-27   Cancelled Treatment:    Reason Eval/Treat Not Completed: Medical issues which prohibited therapy(Chart reviewed for re-attempt at patient evaluation.  Patient noted with mild elevation in troponin after rapid response this AM; pending follow up draw q6.  Will hold initiation of exertional activity until lab work cleared and patient appropriate for activity.)   Dorothy Landgrebe H. Owens Shark, PT, DPT, NCS 07/04/18, 1:50 PM [phone]

## 2018-07-04 NOTE — Progress Notes (Signed)
[facility] NAME: Melissa [name]    MR#:  [id number]   DATE OF BIRTH:  [birth year]  SUBJECTIVE:  Rapid Response called earlier this morning due to extended V. Tach  Family at bedside.  Patient is anxious.  Still complaining of pain from surgery.  REVIEW OF SYSTEMS:    Review of Systems  Constitutional: Negative for fever, chills weight loss HENT: Negative for ear pain, nosebleeds, congestion, facial swelling, rhinorrhea, neck pain, neck stiffness and ear discharge.   Respiratory: Negative for cough, shortness of breath, wheezing  Cardiovascular: Negative for chest pain, palpitations and leg swelling.  Gastrointestinal: Negative for heartburn, abdominal pain, vomiting, diarrhea or consitpation Genitourinary: Negative for dysuria, urgency, frequency, hematuria Musculoskeletal: Negative for back pain or joint pain + stump pain Neurological: Negative for dizziness, seizures, syncope, focal weakness,  numbness and headaches.  Hematological: Does not bruise/bleed easily.  Psychiatric/Behavioral: Negative for hallucinations, confusion, dysphoric mood+ anxious    Tolerating Diet: yes      DRUG ALLERGIES:   Allergies  Allergen Reactions  . Lodine [Etodolac] Hives and Itching  . Methotrexate Derivatives Other (See Comments)    Flu like symptoms  . Nitrofuran Derivatives Nausea And Vomiting and Other (See Comments)    Made sicker   . Levaquin [Levofloxacin] Itching, Rash and Other (See Comments)    Muscle pain  . Norvasc [Amlodipine] Other (See Comments)    Unknown   . Pentasa [Mesalamine Er] Other (See Comments)    Unknown   . Sulfa Antibiotics Itching and Rash  . Vioxx [Rofecoxib] Other (See Comments)    Unknown     VITALS:  Blood pressure 92/73, pulse (!) 119, temperature 98.2 F (36.8 C), temperature source Oral, resp. rate 17, height 5' 4"  (1.626 m), weight 50.6 kg, SpO2 99 %.  PHYSICAL EXAMINATION:  Constitutional:  Appears well-developed and well-nourished. No distress. HENT: Normocephalic. Marland Kitchen Oropharynx is clear and moist.  Eyes: Conjunctivae and EOM are normal. PERRLA, no scleral icterus.  Neck: Normal ROM. Neck supple. No JVD. No tracheal deviation. CVS:irr, irr  no murmurs, no gallops, no carotid bruit.  Pulmonary: Effort and breath sounds normal, no stridor, rhonchi, wheezes, rales.  Abdominal: Soft. BS +,  no distension, tenderness, rebound or guarding.  Musculoskeletal: b/l BKA dressing placed Neuro: Alert. CN 2-12 grossly intact. No focal deficits. Skin: Skin is warm and dry. No rash noted. Psychiatric: Normal mood and affect.      LABORATORY PANEL:   CBC Recent Labs  Lab 07/04/18 0407  WBC 17.0*  HGB 7.9*  HCT 26.0*  PLT 321   ------------------------------------------------------------------------------------------------------------------  Chemistries  Recent Labs  Lab 07/04/18 0407 07/04/18 1107  NA  --  137  K  --  4.2  CL  --  103  CO2  --  25  GLUCOSE  --  208*  BUN  --  21  CREATININE  --  0.46  CALCIUM  --  9.0  MG 2.1  --    ------------------------------------------------------------------------------------------------------------------  Cardiac Enzymes Recent Labs  Lab 07/04/18 1107  TROPONINI 0.04*   ------------------------------------------------------------------------------------------------------------------  RADIOLOGY:  Dg Chest Port 1 View  Result Date: 07/04/2018 CLINICAL DATA:  Chest pain EXAM: PORTABLE CHEST 1 VIEW COMPARISON:  12/14/2017 FINDINGS: Heart is normal size. Chronic increased markings throughout the lungs, likely scarring. No effusions or confluent opacities. No acute bony abnormality. IMPRESSION: Chronic changes.  No active disease. Electronically Signed   By: Rolm Baptise M.D.   On:  07/04/2018 11:07     ASSESSMENT AND PLAN:   77 year old female with history of PAD and PAF.  1.  PAF: Heart rate not controlled well due to  pain [facility] cardiology consult placed and discussed with Dr. Jose Persia Continue diltiazem, aspirin and Eliquis Further recommendations after cardiology evaluation Control pain  2.  Acute on chronic anemia: Transfuse if hemoglobin less than 7  3.  PAD status post bilateral BKA Management as per vascular surgery  4.  Hyperlipidemia: Continue Lopid/pravastatin  5.  Moderate malnutrition: Continue feeding supplement  6.  anxiety: Continue Ativan and Zoloft  7.  Elevated white blood cell count vascular surgery ordered UA and chest x-ray   Management plans discussed with the patient and family and they are is in agreement.  CODE STATUS: Full  TOTAL TIME TAKING CARE OF THIS PATIENT: 26 minutes.     POSSIBLE D/C 3 to 4 days, DEPENDING ON CLINICAL CONDITION.   Melissa [name] M.D on 07/04/2018 at 12:19 PM  Between 7am to 6pm - Pager - [phone] After 6pm go to [email or web address] - password [email or web address] [facility]  Office  [phone]  CC: Primary care physician; [name], MD  Note: This dictation was prepared with Dragon dictation along with smaller phrase technology. Any transcriptional errors that result from this process are unintentional.

## 2018-07-04 NOTE — Progress Notes (Signed)
[facility]  Daily Progress Note   Subjective: 2 Day Post-Op:Right below-the-knee amputation  5 Day Post-Op:Introduction catheter intorightlower extremity 3rd order catheter placementwith additional third order,Contrast injection rightlower extremity for distal runoff with additional 3rd order,Percutaneous transluminal angioplastyright anterior tibial,Percutaneous transluminal angioplastyassociated with crosser atherectomy right posterior tibial unsuccessfulwithStar close closure rightcommon femoral arteriotomy  7Day Post-Op:Introduction catheter intorightlower extremity 3rd order catheter placementwith additional third order,Contrast injection rightlower extremity for distal runoff with additional 3rd order,CSI atherectomy with percutaneous transluminal angioplastyright anterior tibial artery,Percutaneous transluminal angioplastyright posterior tibial arterywithStar close closure leftcommon femoral arteriotomy   Was walking to patient room when code called for possible run of extended V-Tach. Heat rate normalized quickly and code cancelled. EKG at time seemed to be sinus rhythm. Patient not complaining of any chest pain or SOB. Continues to have right stump pain.    Objective: Vitals:   07/03/18 1700 07/03/18 2022 07/04/18 0542 07/04/18 0756  BP: 117/60 (!) 111/58 (!) 109/58 92/73  Pulse: 96 95 (!) 102 (!) 119  Resp:  17 17   Temp: 98.3 F (36.8 C) 98.4 F (36.9 C) 98.2 F (36.8 C) 98.2 F (36.8 C)  TempSrc: Oral Oral Oral Oral  SpO2: 99% 100% 96% 99%  Weight:      Height:        Intake/Output Summary (Last 24 hours) at 07/04/2018 1018 Last data filed at 07/04/2018 0600 Gross per 24 hour  Intake 880.68 ml  Output 600 ml  Net 280.68 ml   Physical Exam: A&Ox3, NAD CV: RRR, Sinus Pulmonary: CTA Bilaterally Abdomen: Soft, Nontender, Nondistended Vascular:  Right Lower Extremity: Thigh soft, calf soft. Knee flexible at the  joint. OR dressing clean,  dry and intact   Laboratory: CBC    Component Value Date/Time   WBC 17.0 (H) 07/04/2018 0407   HGB 7.9 (L) 07/04/2018 0407   HGB 11.6 (L) 06/16/2013 1031   HCT 26.0 (L) 07/04/2018 0407   HCT 34.8 (L) 06/16/2013 1031   PLT 321 07/04/2018 0407   PLT 226 06/16/2013 1031   BMET    Component Value Date/Time   NA 133 (L) 07/03/2018 0333   NA 139 06/16/2013 1031   K 3.6 07/03/2018 0333   K 3.8 06/16/2013 1031   CL 99 07/03/2018 0333   CL 105 06/16/2013 1031   CO2 25 07/03/2018 0333   CO2 30 06/16/2013 1031   GLUCOSE 107 (H) 07/03/2018 0333   GLUCOSE 96 06/16/2013 1031   BUN 11 07/03/2018 0333   BUN 23 (H) 06/16/2013 1031   CREATININE 0.51 07/03/2018 0333   CREATININE 0.58 (L) 06/16/2013 1031   CALCIUM 8.8 (L) 07/03/2018 0333   CALCIUM 9.7 06/16/2013 1031   GFRNONAA >60 07/03/2018 0333   GFRNONAA >60 06/16/2013 1031   GFRAA >60 07/03/2018 0333   GFRAA >60 06/16/2013 1031   Assessment/Planning: The patient is a 77 year old female status post 2 endovascular interventions to the right lower extremity, postop day 3 right below the knee amputation- Stable 1) Increasing WBC now 17.0 - Will order CXR, Urinalysis / culture. Will follow.  2) Hbg: stable 3) BMP not drawn this AM? Will order STAT to review electrolytes  4) Will order troponin 5) Encouraged incentive spirometry  6) Will consult cardiology for run of V-tach this AM. Seems patient does not have a cardiologist and is managed by PCP 7) As per Dr. Delana Meyer will keep pain regimen the same and not increase 8) PT and OT  Discussed with Dr. Delana Meyer  Marcelle Overlie PA-C 07/04/2018 10:18 AM

## 2018-07-04 NOTE — Progress Notes (Signed)
PT Cancellation Note  Patient Details Name: Melissa [name] MRN: [id number] DOB: 09/06/1940   Cancelled Treatment:    Reason Eval/Treat Not Completed: Other (comment).  Pt with rapid response this morning for extended run of v-tach (per notes HR normalized quickly and code cancelled).  Labs ordered (troponin, BMP).  Will hold PT at this time until results of labs are known and pt is determined to be appropriate for physical therapy (discussed with pt's nurse).  Leitha Bleak, PT 07/04/18, 11:53 AM [phone]

## 2018-07-05 ENCOUNTER — Inpatient Hospital Stay: Payer: Medicare HMO

## 2018-07-05 LAB — BASIC METABOLIC PANEL
Anion gap: 14 (ref 5–15)
BUN: 20 mg/dL (ref 8–23)
CHLORIDE: 101 mmol/L (ref 98–111)
CO2: 22 mmol/L (ref 22–32)
Calcium: 9.2 mg/dL (ref 8.9–10.3)
Creatinine, Ser: 0.52 mg/dL (ref 0.44–1.00)
GFR calc Af Amer: >60 mL/min (ref >60)
GFR calc non Af Amer: >60 mL/min (ref >60)
Glucose, Bld: 105 mg/dL — ABNORMAL HIGH (ref 70–99)
Potassium: 4.5 mmol/L (ref 3.5–5.1)
Sodium: 137 mmol/L (ref 135–145)

## 2018-07-05 LAB — CBC
HCT: 26.2 % — ABNORMAL LOW (ref 36.0–46.0)
Hemoglobin: 7.8 g/dL — ABNORMAL LOW (ref 12.0–15.0)
MCH: 30.2 pg (ref 26.0–34.0)
MCHC: 29.8 g/dL — ABNORMAL LOW (ref 30.0–36.0)
MCV: 101.6 fL — ABNORMAL HIGH (ref 80.0–100.0)
Platelets: 377 10*3/uL (ref 150–400)
RBC: 2.58 MIL/uL — ABNORMAL LOW (ref 3.87–5.11)
RDW: 13.9 % (ref 11.5–15.5)
WBC: 15.2 10*3/uL — ABNORMAL HIGH (ref 4.0–10.5)
nRBC: 0 % (ref 0.0–0.2)

## 2018-07-05 LAB — GLUCOSE, CAPILLARY: GLUCOSE-CAPILLARY: 99 mg/dL (ref 70–99)

## 2018-07-05 LAB — MAGNESIUM: Magnesium: 1.8 mg/dL (ref 1.7–2.4)

## 2018-07-05 MED ORDER — LORAZEPAM 0.5 MG PO TABS
0.5000 mg | ORAL_TABLET | Freq: Four times a day (QID) | ORAL | Status: DC | PRN
  Administered 2018-07-05 – 2018-07-08 (×6): 0.5 mg via ORAL
  Filled 2018-07-05 (×6): qty 1

## 2018-07-05 MED ORDER — BUDESONIDE 3 MG PO CPEP
3.0000 mg | ORAL_CAPSULE | Freq: Every day | ORAL | Status: DC
  Administered 2018-07-06 – 2018-07-10 (×5): 3 mg via ORAL
  Filled 2018-07-05 (×6): qty 1

## 2018-07-05 MED ORDER — RISPERIDONE 1 MG PO TABS
1.0000 mg | ORAL_TABLET | Freq: Every day | ORAL | Status: DC
  Administered 2018-07-05 – 2018-07-10 (×6): 1 mg via ORAL
  Filled 2018-07-05 (×8): qty 1

## 2018-07-05 MED ORDER — LISINOPRIL 5 MG PO TABS
5.0000 mg | ORAL_TABLET | Freq: Every day | ORAL | Status: DC
  Administered 2018-07-06 – 2018-07-11 (×6): 5 mg via ORAL
  Filled 2018-07-05 (×6): qty 1

## 2018-07-05 NOTE — Clinical Social Work Note (Signed)
Clinical Social Work Assessment  Patient Details  Name: Melissa [name] MRN: [id number] Date of Birth: 1941/03/15  Date of referral:  07/03/18               Reason for consult:  Facility Placement                Permission sought to share information with:  Facility Sport and exercise psychologist Permission granted to share information::  Yes, Verbal Permission Granted  Name::     Richardean Chimera Daughter [phone] or Sime,Charles Spouse   [phone] or Kilker,Dawn Daughter   [phone]   Agency::  SNF admissions  Relationship::     Contact Information:     Housing/Transportation Living arrangements for the past 2 months:  Single Family Home Source of Information:  Patient, Adult Children Patient Interpreter Needed:  None Criminal Activity/Legal Involvement Pertinent to Current Situation/Hospitalization:  No - Comment as needed Significant Relationships:  Adult Children, Spouse Lives with:  Spouse, Adult Children Do you feel safe going back to the place where you live?  No Need for family participation in patient care:  Yes (Comment)  Care giving concerns:  Patient and family feel she needs some short term rehab before she goes home, and she may need to transition to long term care.   Social Worker assessment / plan:  Patient is a 77 year old female who is alert and oriented x2.  Patient lives with her husband and daughter.  Patient and family were explained role of CSW and process for looking for placement for patient.  Patient has been to [facility] in the past and they would like to have patient return if possible.  CSW explained how insurance will pay for stay at SNF and what to expect at SNF.  Patient and family expressed they are familiar with process, and know they will have to get insurance authorization before patient is able to discharge to SNF.  Patient and family did not have any other questions or concerns and gave CSW permission to begin bed search in [place].   Employment status:  Retired Nurse, adult PT Recommendations:  [place] / Referral to community resources:  [place]  Patient/Family's Response to care:  Patient and family are in agreement to having patient go to SNF.  Patient/Family's Understanding of and Emotional Response to Diagnosis, Current Treatment, and Prognosis:  Patient and family are hopeful that she will make progress to return home, but are considering long term care at [facility] after short term rehab.  Emotional Assessment Appearance:  Appears older than stated age Attitude/Demeanor/Rapport:    Affect (typically observed):  Appropriate, Stable, Pleasant Orientation:  Oriented to Self, Oriented to Place Alcohol / Substance use:  Not Applicable Psych involvement (Current and /or in the community):  No (Comment)  Discharge Needs  Concerns to be addressed:  Lack of Support, Care Coordination Readmission within the last 30 days:  No Current discharge risk:  Cognitively Impaired, Lack of support system Barriers to Discharge:  Continued Medical Work up, [employer]   Anell Barr 07/05/2018, 5:16 PM

## 2018-07-05 NOTE — Progress Notes (Signed)
OT Cancellation Note  Patient Details Name: Melissa [name] MRN: [id number] DOB: May 20, 1941   Cancelled Treatment:    Reason Eval/Treat Not Completed: Patient at procedure or test/ unavailable(Pt. had a fall this a.m., and has been out of the room for a CT scan this a.m. Pt. Was having breakfast breakfast, and is now with the nurse. Will continue to monitor, and eval at a later time/date )  Harrel Carina, MS,  OTR/L 07/05/2018, 10:07 AM

## 2018-07-05 NOTE — Progress Notes (Signed)
[facility]  Daily Progress Note     Subjective: 3Day Post-Op:Right below-the-knee amputation  6Day Post-Op:Introduction catheter intorightlower extremity 3rd order catheter placementwith additional third order,Contrast injection rightlower extremity for distal runoff with additional 3rd order,Percutaneous transluminal angioplastyright anterior tibial,Percutaneous transluminal angioplastyassociated with crosser atherectomy right posterior tibial unsuccessfulwithStar close closure rightcommon femoral arteriotomy  8Day Post-Op:Introduction catheter intorightlower extremity 3rd order catheter placementwith additional third order,Contrast injection rightlower extremity for distal runoff with additional 3rd order,CSI atherectomy with percutaneous transluminal angioplastyright anterior tibial artery,Percutaneous transluminal angioplastyright posterior tibial arterywithStar close closure leftcommon femoral arteriotomy  Patient fell out of bed this AM. Had CT of head - no acute finding. Seen by ortho for wrist pain - no acute fracture. WBC count decreased. Report from AM nurse stating patient is in pain and asking for pain medication to be increased. As explained yesterday, the patient becomes dangerously lethargic after given narcotics / benzos. Fall precautions have been ordered. Each time I have examined the patient, she is not agitated or in pain. I believe the patient is sun downing and this is the cause of her increased pain and agitation overnight. Will add Risperdal 50m at bedtime. Will also ativan dose from 153mto 0.33m66m6 hours. Will plan discharge to SNF early next week.   Objective: Vitals:   07/05/18 0830 07/05/18 0928 07/05/18 0946 07/05/18 1249  BP: 122/73 105/69 113/71 104/85  Pulse: (!) 105 (!) 103 (!) 113 86  Resp:      Temp:      TempSrc:      SpO2: 98% 100% 100% 100%  Weight:      Height:        Intake/Output Summary (Last  24 hours) at 07/05/2018 1550 Last data filed at 07/05/2018 1321 Gross per 24 hour  Intake -  Output 250 ml  Net -250 ml   Physical Exam: Confused, NAD CV: Irregularity Irregular Pulmonary: CTA Bilaterally Abdomen: Soft, Nontender, Nondistended Vascular:  Right Lower Extremity: Thigh soft. Flexible at knee joint. OR dressing removed. Blister with clear fluid noted on bottom of stump. Incision clean dry and intact.    Laboratory: CBC    Component Value Date/Time   WBC 15.2 (H) 07/05/2018 0345   HGB 7.8 (L) 07/05/2018 0345   HGB 11.6 (L) 06/16/2013 1031   HCT 26.2 (L) 07/05/2018 0345   HCT 34.8 (L) 06/16/2013 1031   PLT 377 07/05/2018 0345   PLT 226 06/16/2013 1031   BMET    Component Value Date/Time   NA 137 07/05/2018 0345   NA 139 06/16/2013 1031   K 4.5 07/05/2018 0345   K 3.8 06/16/2013 1031   CL 101 07/05/2018 0345   CL 105 06/16/2013 1031   CO2 22 07/05/2018 0345   CO2 30 06/16/2013 1031   GLUCOSE 105 (H) 07/05/2018 0345   GLUCOSE 96 06/16/2013 1031   BUN 20 07/05/2018 0345   BUN 23 (H) 06/16/2013 1031   CREATININE 0.52 07/05/2018 0345   CREATININE 0.58 (L) 06/16/2013 1031   CALCIUM 9.2 07/05/2018 0345   CALCIUM 9.7 06/16/2013 1031   GFRNONAA >60 07/05/2018 0345   GFRNONAA >60 06/16/2013 1031   GFRAA >60 07/05/2018 0345   GFRAA >60 06/16/2013 1031   Assessment/Planning: The patient is a 77 63ar old female status post 2 endovascular interventions to the right lower extremity, postop day3right below the knee amputation- Stable  1) As above  Discussed with Dr. SchEber Hongegmayer PA-C 07/05/2018 3:50 PM

## 2018-07-05 NOTE — Consult Note (Signed)
Right wrist is minimally tender, has significant preexisting DJD Recommend activity as tolerated, no brace or cast needed for RUe.

## 2018-07-05 NOTE — Progress Notes (Signed)
Rehab Admissions Coordinator Note:  Per PT recommendation, Pt patient was screened by Jhonnie Garner for appropriateness for an Inpatient Acute Rehab Consult.  At this time, [facility] notes pt preference is for SNF over CIR. AC will sign off. Please place another screen or contact me if pt would like to be considered for CIR.    Jhonnie Garner 07/05/2018, 4:53 PM  I can be reached at [phone].

## 2018-07-05 NOTE — Progress Notes (Signed)
Patient with fall this morning approximately 0810. 2 RNs ran to room as soon as bed alarm sounded, and patient was found on the floor near the foot of the bed (side rail was up), lying on her right side. Patient was alert, stating she was sorry and she thought she was getting into her wheelchair. Patient remains pleasantly confused as had been reported overnight. Staff members returned patient to bed after inspecting for injuries. No skin tears noted. Patient reports her right wrist being sore, no visible deformities or bruising were noted at that time. Patient denies head pain, but thinks she may have hit her head. A visibile "goose egg" is noted on right temporal area with some mild bruising beginning. This was not noticed during previous morning assessment by RN or NT. MD was notified and patient is now downstairs for a head CT. Only other complaint is her surgical site (R BKA) pain that is unchanged since the fall. Daughter, [name], did not answer the phone, left voicemail asking her to call. Second daughter listed, Arrie Aran, was notified and stated she will update the necessary family members. Vitals have been stable thus far, will resume when she returns from scan. Patient and belongings moved to room 259 to be closer to nurses station, floor mats in place, low bed ordered. Will continue to monitor.

## 2018-07-05 NOTE — Progress Notes (Signed)
Per family, patient should only be getting 40m of lisinopril and 362mof budesonide. Notified Dr. MoBenjie Karvoneninstructed to change order.

## 2018-07-05 NOTE — NC FL2 (Signed)
[place] LEVEL OF CARE SCREENING TOOL     IDENTIFICATION  Patient Name: Melissa [name] Birthdate: [birth year] Sex: female Admission Date (Current Location): 06/26/2018  [name] and [place] Number:  Engineering geologist and Address:  [facility], [address] [place], [place] [address]      Provider Number: [id number]  Attending Physician Name and Address:  Katha Cabal, MD  Relative Name and Phone Number:  Richardean Chimera Daughter [phone]      Current Level of Care:  Hospital Recommended Level of Care: [place] Prior Approval Number:    Date Approved/Denied:   PASRR Number: [id number]  Discharge Plan: SNF    Current Diagnoses: Patient Active Problem List   Diagnosis Date Noted  . Protein-calorie malnutrition, severe 07/03/2018  . Ischemic leg 06/26/2018  . Complication of below knee amputation stump ([place]) 02/04/2018  . Pressure ulcer of BKA stump, unstageable ([place]) 01/14/2018  . 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 Atherosclerosis of native arteries of extremity with rest pain ([place]) 11/18/2017  . Essential hypertension 11/18/2017  . Pure hypercholesterolemia 11/18/2017  . GERD (gastroesophageal reflux disease) 11/18/2017  . Colitis 04/19/2017  . UTI due to extended-spectrum beta lactamase (ESBL) producing Escherichia coli 04/19/2017  . Other secondary osteoarthritis of one knee 04/04/2016    Orientation RESPIRATION BLADDER Height & Weight     Self, Place  O2(2L) Continent Weight: 104 lb (47.2 kg) Height:  5' 4"  (162.6 cm)  BEHAVIORAL SYMPTOMS/MOOD NEUROLOGICAL BOWEL NUTRITION STATUS      Continent Diet(Regular diet)  AMBULATORY STATUS COMMUNICATION OF NEEDS Skin   Limited Assist   Surgical wounds                       Personal Care Assistance Level of Assistance  Bathing, Feeding, Dressing Bathing Assistance: Limited assistance Feeding assistance: Independent Dressing Assistance: Limited  assistance     Functional Limitations Info  Sight, Speech, Hearing Sight Info: Adequate Hearing Info: Adequate Speech Info: Adequate    SPECIAL CARE FACTORS FREQUENCY  PT (By licensed PT), OT (By licensed OT)     PT Frequency: 5x a week OT Frequency: 5x a week            Contractures      Additional Factors Info  Code Status, Allergies, Psychotropic Code Status Info: Full Code Allergies Info: LODINE ETODOLAC, METHOTREXATE DERIVATIVES, NITROFURAN DERIVATIVES, LEVAQUIN LEVOFLOXACIN, NORVASC AMLODIPINE, PENTASA MESALAMINE ER, SULFA ANTIBIOTICS, VIOXX ROFECOXIB  Psychotropic Info: sertraline (ZOLOFT) tablet 100 mg          Current Medications (07/05/2018):  This is the current hospital active medication list Current Facility-Administered Medications  Medication Dose Route Frequency Provider Last Rate Last Dose  . 0.9 %  sodium chloride infusion  250 mL Intravenous PRN Schnier, Dolores Lory, MD      . acetaminophen (TYLENOL) tablet 1,000 mg  1,000 mg Oral Q8H PRN Schnier, Dolores Lory, MD   1,000 mg at 07/04/18 1418  . apixaban (ELIQUIS) tablet 5 mg  5 mg Oral BID Fritzi Mandes, MD   5 mg at 07/05/18 9937  . aspirin EC tablet 81 mg  81 mg Oral Daily Schnier, Dolores Lory, MD   81 mg at 07/05/18 0955  . [START ON 07/06/2018] budesonide (ENTOCORT EC) 24 hr capsule 3 mg  3 mg Oral Daily Mody, Sital, MD      . calcium-vitamin D (OSCAL WITH D) 500-200 MG-UNIT per tablet 1 tablet  1 tablet Oral BID  Schnier, Dolores Lory, MD   1 tablet at 07/05/18 [phone]  . diltiazem (CARDIZEM CD) 24 hr capsule 240 mg  240 mg Oral Daily Schnier, Dolores Lory, MD   240 mg at 07/05/18 0953  . docusate sodium (COLACE) capsule 100 mg  100 mg Oral BID Stegmayer, Kimberly A, PA-C   100 mg at 07/04/18 2032  . feeding supplement (ENSURE ENLIVE) (ENSURE ENLIVE) liquid 237 mL  237 mL Oral BID BM Schnier, Dolores Lory, MD   237 mL at 07/05/18 1047  . ferrous sulfate tablet 325 mg  325 mg Oral Q breakfast Schnier, Dolores Lory, MD   325  mg at 07/05/18 0955  . gabapentin (NEURONTIN) capsule 300 mg  300 mg Oral TID Katha Cabal, MD   300 mg at 07/05/18 1250  . gabapentin (NEURONTIN) capsule 600 mg  600 mg Oral QHS Schnier, Dolores Lory, MD   600 mg at 07/04/18 2032  . gemfibrozil (LOPID) tablet 600 mg  600 mg Oral BID AC Schnier, Dolores Lory, MD   600 mg at 07/05/18 7371  . hydroxychloroquine (PLAQUENIL) tablet 200 mg  200 mg Oral BID Delana Meyer Dolores Lory, MD   200 mg at 07/05/18 0957  . labetalol (NORMODYNE,TRANDATE) injection 10 mg  10 mg Intravenous Q10 min PRN Schnier, Dolores Lory, MD      . leflunomide (ARAVA) tablet 20 mg  20 mg Oral Daily Schnier, Dolores Lory, MD   20 mg at 07/05/18 0956  . [START ON 07/06/2018] lisinopril (PRINIVIL,ZESTRIL) tablet 5 mg  5 mg Oral Daily Mody, Sital, MD      . LORazepam (ATIVAN) tablet 1 mg  1 mg Oral Q6H PRN Schnier, Dolores Lory, MD   1 mg at 07/05/18 1030  . MEDLINE mouth rinse  15 mL Mouth Rinse BID Schnier, Dolores Lory, MD   15 mL at 07/02/18 2117  . multivitamin with minerals tablet 1 tablet  1 tablet Oral Daily Schnier, Dolores Lory, MD   1 tablet at 07/05/18 [phone]  . mupirocin ointment (BACTROBAN) 2 %   Topical Daily Schnier, Dolores Lory, MD      . ondansetron [facility]) injection 4 mg  4 mg Intravenous Q6H PRN Schnier, Dolores Lory, MD   4 mg at 07/01/18 1435  . oxyCODONE (Oxy IR/ROXICODONE) immediate release tablet 5 mg  5 mg Oral Q4H PRN Stegmayer, Kimberly A, PA-C   5 mg at 07/05/18 1152  . pantoprazole (PROTONIX) EC tablet 40 mg  40 mg Oral Daily Schnier, Dolores Lory, MD   40 mg at 07/05/18 0955  . polyethylene glycol (MIRALAX / GLYCOLAX) packet 17 g  17 g Oral Daily PRN Stegmayer, Kimberly A, PA-C      . pravastatin (PRAVACHOL) tablet 80 mg  80 mg Oral QHS Schnier, Dolores Lory, MD   80 mg at 07/04/18 2033  . predniSONE (DELTASONE) tablet 5 mg  5 mg Oral Q breakfast Schnier, Dolores Lory, MD   5 mg at 07/05/18 0954  . sertraline (ZOLOFT) tablet 100 mg  100 mg Oral Daily Schnier, Dolores Lory, MD   100 mg at  07/05/18 0953  . sodium chloride flush (NS) 0.9 % injection 3 mL  3 mL Intravenous Q12H Schnier, Dolores Lory, MD   3 mL at 07/05/18 1047  . sodium chloride flush (NS) 0.9 % injection 3 mL  3 mL Intravenous PRN Schnier, Dolores Lory, MD      . vitamin C (ASCORBIC ACID) tablet 250 mg  250 mg Oral BID Stegmayer, Joelene Millin  A, PA-C   250 mg at 07/05/18 9106     Discharge Medications: Please see discharge summary for a list of discharge medications.  Relevant Imaging Results:  Relevant Lab Results:   Additional Information SSN [id number]  Ross Ludwig, [place]

## 2018-07-05 NOTE — Clinical Social Work Note (Signed)
CSW spoke with patient and her family she does not want to go to CIR per PT recommendation.  Patient and family prefer SNF placement.  Patient has been faxed out awaiting bed offers, patient and family prefer [facility].  Patient's family asked about resources and support groups for amputee patient, CSW printed off information for  [facility] support groups and provided family with resources.  CSW to continue to follow patient's progress throughout discharge planning.  Jones Broom. Flasher, MSW, [place]  07/05/2018 5:10 PM

## 2018-07-05 NOTE — Evaluation (Signed)
Physical Therapy Evaluation Patient Details Name: Melissa [name] MRN: [id number] DOB: Mar 03, 1941 Today's Date: 07/05/2018   History of Present Illness  77 yo female with new R BK amputation 07/03/18  after previous L BK amputation was admitted and note pain is severe but unmedicated when PT arrived.  Note low troponins, low endurance, fell prior to PT arrival.  PMHx:  COPD, RA, foot ulcers, ataxia, a-fib, diverticulosis, PVD, MRSA, transfusions, excessive falls  Clinical Impression  Pt was seen for initial efforts at transfers, using cued support of arms in sitting with posterior scoot to BSC.  Pt was then assisted back out with max assist of PT using bed pad for some protection of RLE pain issues.  Pt is motivated but exhausted by the effort to be up in [facility].  Will continue on and work toward chair from bed with focus on endurance and increased strength within tolerance for LE's and core.    Follow Up Recommendations CIR    Equipment Recommendations  None recommended by PT    Recommendations for Other Services       Precautions / Restrictions Precautions Precautions: Fall(telemetry) Restrictions Weight Bearing Restrictions: No      Mobility  Bed Mobility Overal bed mobility: Needs Assistance Bed Mobility: Sit to Supine;Supine to Sit     Supine to sit: Mod assist Sit to supine: Max assist   General bed mobility comments: painful to transition to [facility] or roll  Transfers Overall transfer level: Needs assistance Equipment used: 1 person hand held assist Transfers: Comptroller transfers: Mod assist;Max assist   General transfer comment: mod to chair and max out of chair  Ambulation/Gait             General Gait Details: B BK with no Prosthesis  Stairs            Wheelchair Mobility    Modified Rankin (Stroke Patients Only)       Balance Overall balance assessment: Needs assistance Sitting-balance support:  Bilateral upper extremity supported Sitting balance-Leahy Scale: Fair                                       Pertinent Vitals/Pain Pain Assessment: 0-10 Pain Score: 10-Worst pain ever Pain Location: R BK amputation with any movement Pain Descriptors / Indicators: Operative site guarding Pain Intervention(s): Limited activity within patient's tolerance;Monitored during session;Premedicated before session;Repositioned    Home Living Family/patient expects to be discharged to:: Private residence Living Arrangements: Spouse/significant other Available Help at Discharge: Family;Available 24 hours/day;Available PRN/intermittently(family works and husband cannot lift her) Type of Home: House Home Access: [place]: One level Home Equipment: Grab bars - toilet;Toilet riser      Prior Function Level of Independence: Needs assistance   Gait / Transfers Assistance Needed: previously was working on transfers and needed assist to walk  ADL's / Homemaking Assistance Needed: home wiht family to help her do errands and housework        Hand Dominance   Dominant Hand: Right    Extremity/Trunk Assessment   Upper Extremity Assessment Upper Extremity Assessment: Generalized weakness    Lower Extremity Assessment Lower Extremity Assessment: Generalized weakness;RLE deficits/detail RLE Deficits / Details: new BK surgery with severe pain RLE: Unable to fully assess due to pain RLE Coordination: decreased gross motor    Cervical / Trunk  Assessment Cervical / Trunk Assessment: Kyphotic  Communication   Communication: No difficulties  Cognition Arousal/Alertness: Awake/alert Behavior During Therapy: WFL for tasks assessed/performed Overall Cognitive Status: Impaired/Different from baseline Area of Impairment: Safety/judgement;Awareness;Problem solving;Following commands                       Following Commands: Follows one step commands  with increased time Safety/Judgement: Decreased awareness of deficits;Decreased awareness of safety Awareness: Intellectual Problem Solving: Slow processing;Difficulty sequencing;Requires verbal cues;Requires tactile cues General Comments: cued all hand placements      General Comments General comments (skin integrity, edema, etc.): sitting on BSC was practical due to sitting control and supported RLE with limited pain    Exercises     Assessment/Plan    PT Assessment Patient needs continued PT services  PT Problem List Decreased strength;Decreased range of motion;Decreased activity tolerance;Decreased balance;Decreased mobility;Decreased coordination;Decreased safety awareness;Pain;Decreased skin integrity       PT Treatment Interventions      PT Goals (Current goals can be found in the Care Plan section)  Acute Rehab PT Goals Patient Stated Goal: to get pain managed PT Goal Formulation: With patient/family Time For Goal Achievement: 07/19/18 Potential to Achieve Goals: Good    Frequency Min 2X/week   Barriers to discharge Decreased caregiver support(home with husband who cannot lift her) will need SNF to manage her safety and strengthen her    Co-evaluation               AM-PAC PT "6 Clicks" Mobility  Outcome Measure Help needed turning from your back to your side while in a flat bed without using bedrails?: A Lot Help needed moving from lying on your back to sitting on the side of a flat bed without using bedrails?: Total Help needed moving to and from a bed to a chair (including a wheelchair)?: Total Help needed standing up from a chair using your arms (e.g., wheelchair or bedside chair)?: Total Help needed to walk in hospital room?: Total Help needed climbing 3-5 steps with a railing? : Total 6 Click Score: 7    End of Session   Activity Tolerance: Patient limited by pain Patient left: in bed;with call bell/phone within reach;with bed alarm set;with  family/visitor present;with nursing/sitter in room Nurse Communication: Mobility status;Patient requests pain meds PT Visit Diagnosis: Muscle weakness (generalized) (M62.81);Other abnormalities of gait and mobility (R26.89);Pain Pain - Right/Left: Right Pain - part of body: Leg    Time: 1834-3735 PT Time Calculation (min) (ACUTE ONLY): 44 min   Charges:   PT Evaluation $PT Eval Moderate Complexity: 1 Mod PT Treatments $Therapeutic Activity: 23-37 mins       Ramond Dial 07/05/2018, 2:29 PM   Mee Hives, PT MS Acute Rehab Dept. Number: [place] and [place]

## 2018-07-05 NOTE — Care Management Note (Signed)
Case Management Note  Patient Details  Name: Melissa [name] MRN: [id number] Date of Birth: 07-22-1940  Subjective/Objective:     Spoke with patient and family about their preference as far as CIR and SNF placement.  The family and patient would like to discharge to SNF.  She has been to [facility] before and was very happy with her rehab experience.   Randall Hiss, CSW aware of patient preference.              Action/Plan:   Expected Discharge Date:                  Expected Discharge Plan:  [place]  In-House Referral:     Discharge planning Services  CM Consult  Post Acute Care Choice:    Choice offered to:     DME Arranged:    DME Agency:     HH Arranged:    [place] Agency:     Status of Service:  In process, will continue to follow  If discussed at Long Length of Stay Meetings, dates discussed:    Additional Comments:  Elza Rafter, RN 07/05/2018, 4:31 PM

## 2018-07-05 NOTE — Progress Notes (Signed)
[place] at [place] NAME: Melissa [name]    MR#:  [id number]   DATE OF BIRTH:  [birth year]  SUBJECTIVE:  Patient with fall this am Family at bedside Heart rate better controlled Pain better controlled this am  REVIEW OF SYSTEMS:    Review of Systems  Constitutional: Negative for fever, chills weight loss HENT: Negative for ear pain, nosebleeds, congestion, facial swelling, rhinorrhea, neck pain, neck stiffness and ear discharge.   Respiratory: Negative for cough, shortness of breath, wheezing  Cardiovascular: Negative for chest pain, palpitations and leg swelling.  Gastrointestinal: Negative for heartburn, abdominal pain, vomiting, diarrhea or consitpation Genitourinary: Negative for dysuria, urgency, frequency, hematuria Musculoskeletal: Negative for back pain or joint pain ++Wrist pain Neurological: Negative for dizziness, seizures, syncope, focal weakness,  numbness and headaches.  Hematological: Does not bruise/bleed easily.  Psychiatric/Behavioral: Negative for hallucinations, confusion, dysphoric mood    Tolerating Diet: yes      DRUG ALLERGIES:   Allergies  Allergen Reactions  . Lodine [Etodolac] Hives and Itching  . Methotrexate Derivatives Other (See Comments)    Flu like symptoms  . Nitrofuran Derivatives Nausea And Vomiting and Other (See Comments)    Made sicker   . Levaquin [Levofloxacin] Itching, Rash and Other (See Comments)    Muscle pain  . Norvasc [Amlodipine] Other (See Comments)    Unknown   . Pentasa [Mesalamine Er] Other (See Comments)    Unknown   . Sulfa Antibiotics Itching and Rash  . Vioxx [Rofecoxib] Other (See Comments)    Unknown     VITALS:  Blood pressure 113/71, pulse (!) 113, temperature 98.7 F (37.1 C), temperature source Oral, resp. rate 16, height 5' 4"  (1.626 m), weight 47.2 kg, SpO2 100 %.  PHYSICAL EXAMINATION:  Constitutional: Appears well-developed and well-nourished. No  distress. HENT: Normocephalic. Marland Kitchen Oropharynx is clear and moist.  Eyes: Conjunctivae and EOM are normal. PERRLA, no scleral icterus.  Neck: Normal ROM. Neck supple. No JVD. No tracheal deviation. EXN:TZGYF   no murmurs, no gallops, no carotid bruit.  Pulmonary: Effort and breath sounds normal, no stridor, rhonchi, wheezes, rales.  Abdominal: Soft. BS +,  no distension, tenderness, rebound or guarding.  Musculoskeletal: b/l BKA dressing placed Neuro: Alert. CN 2-12 grossly intact. No focal deficits. Skin: Skin is warm and dry. No rash noted. Psychiatric: Normal mood and affect.      LABORATORY PANEL:   CBC Recent Labs  Lab 07/05/18 0345  WBC 15.2*  HGB 7.8*  HCT 26.2*  PLT 377   ------------------------------------------------------------------------------------------------------------------  Chemistries  Recent Labs  Lab 07/05/18 0345  NA 137  K 4.5  CL 101  CO2 22  GLUCOSE 105*  BUN 20  CREATININE 0.52  CALCIUM 9.2  MG 1.8   ------------------------------------------------------------------------------------------------------------------  Cardiac Enzymes Recent Labs  Lab 07/04/18 1107 07/04/18 1614 07/04/18 2237  TROPONINI 0.04* 0.03* 0.04*   ------------------------------------------------------------------------------------------------------------------  RADIOLOGY:  Dg Wrist 2 Views Right  Result Date: 07/05/2018 CLINICAL DATA:  Fall, landed on right wrist.  Pain EXAM: RIGHT WRIST - 2 VIEW COMPARISON:  None. 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 Diffuse vascular calcifications. No fracture. Slight widening of the scapholunate distance. IMPRESSION: Degenerative changes in the right wrist.  No fracture. Slight widening of the scapholunate distance could reflect scapholunate dissociation. Electronically Signed   By: Rolm Baptise M.D.   On: 07/05/2018 10:35   Ct Head Wo Contrast  Result Date:  07/05/2018 CLINICAL  DATA:  Fall EXAM: CT HEAD WITHOUT CONTRAST TECHNIQUE: Contiguous axial images were obtained from the base of the skull through the vertex without intravenous contrast. COMPARISON:  06/08/2017 FINDINGS: Brain: Mild global atrophy appropriate to age. Minimal chronic ischemic changes in the periventricular white matter. There is no mass effect, midline shift, or acute intracranial hemorrhage. Vascular: No hyperdense vessel or unexpected calcification. Skull: The cranium is intact. Sinuses/Orbits: Visualized mastoid air cells and paranasal sinuses are clear. Orbits are grossly within normal limits. Other: Soft tissue hematoma over the right temporal bone is noted. IMPRESSION: No acute intracranial pathology. Electronically Signed   By: Marybelle Killings M.D.   On: 07/05/2018 09:03   Dg Chest Port 1 View  Result Date: 07/04/2018 CLINICAL DATA:  Chest pain EXAM: PORTABLE CHEST 1 VIEW COMPARISON:  12/14/2017 FINDINGS: Heart is normal size. Chronic increased markings throughout the lungs, likely scarring. No effusions or confluent opacities. No acute bony abnormality. IMPRESSION: Chronic changes.  No active disease. Electronically Signed   By: Rolm Baptise M.D.   On: 07/04/2018 11:07     ASSESSMENT AND PLAN:   77 year old female with history of PAD and PAF.  1.  PAF: Heart rate better controlled this am Continue diltiazem, aspirin and Eliquis Restart Eliquis when ok with surgery.    2.  Acute on chronic anemia: Transfuse if hemoglobin less than 7 3.  PAD status post bilateral BKA Management as per vascular surgery  4.  Hyperlipidemia: Continue Lopid/pravastatin  5.  Moderate malnutrition: Continue feeding supplement  6.  anxiety: Continue Ativan and Zoloft  7. Fall with possible Slight widening of the scapholunate distance could reflect scapholunate dissociation Ortho consulted via epic Dr. Rudene Christians  Management plans discussed with the patient and family and they are is in  agreement.  CODE STATUS: Full  TOTAL TIME TAKING CARE OF THIS PATIENT: 26 minutes.    PT for d/c planning   POSSIBLE D/C 2 days, DEPENDING ON CLINICAL CONDITION.   Sheridan Hew M.D on 07/05/2018 at 12:38 PM  Between 7am to 6pm - Pager - [phone] After 6pm go to [email or web address] - password [email or web address] [place] [facility]  Office  [phone]  CC: Primary care physician; [name], MD  Note: This dictation was prepared with Dragon dictation along with smaller phrase technology. Any transcriptional errors that result from this process are unintentional.

## 2018-07-06 LAB — URINE CULTURE

## 2018-07-06 LAB — BASIC METABOLIC PANEL
Anion gap: 10 (ref 5–15)
BUN: 24 mg/dL — ABNORMAL HIGH (ref 8–23)
CALCIUM: 9.1 mg/dL (ref 8.9–10.3)
CO2: 25 mmol/L (ref 22–32)
Chloride: 100 mmol/L (ref 98–111)
Creatinine, Ser: 0.51 mg/dL (ref 0.44–1.00)
GFR calc Af Amer: >60 mL/min (ref >60)
GFR calc non Af Amer: >60 mL/min (ref >60)
Glucose, Bld: 134 mg/dL — ABNORMAL HIGH (ref 70–99)
Potassium: 4.5 mmol/L (ref 3.5–5.1)
Sodium: 135 mmol/L (ref 135–145)

## 2018-07-06 LAB — PREPARE RBC (CROSSMATCH)

## 2018-07-06 LAB — CBC
HCT: 23.8 % — ABNORMAL LOW (ref 36.0–46.0)
Hemoglobin: 7.2 g/dL — ABNORMAL LOW (ref 12.0–15.0)
MCH: 29.8 pg (ref 26.0–34.0)
MCHC: 30.3 g/dL (ref 30.0–36.0)
MCV: 98.3 fL (ref 80.0–100.0)
Platelets: 323 10*3/uL (ref 150–400)
RBC: 2.42 MIL/uL — ABNORMAL LOW (ref 3.87–5.11)
RDW: 13.9 % (ref 11.5–15.5)
WBC: 11.8 10*3/uL — ABNORMAL HIGH (ref 4.0–10.5)
nRBC: 0 % (ref 0.0–0.2)

## 2018-07-06 LAB — MAGNESIUM: Magnesium: 2.1 mg/dL (ref 1.7–2.4)

## 2018-07-06 MED ORDER — SODIUM CHLORIDE 0.9% IV SOLUTION: Freq: Once | INTRAVENOUS | Status: AC

## 2018-07-06 NOTE — Progress Notes (Signed)
Physical Therapy Treatment Patient Details Name: Melissa [name] MRN: [id number] DOB: Mar 09, 1941 Today's Date: 07/06/2018    History of Present Illness 77 yo female with new R BK amputation 07/03/18  after previous L BK amputation was admitted and note pain is severe but unmedicated when PT arrived.  Note low troponins, low endurance, fell prior to PT arrival.  PMHx:  COPD, RA, foot ulcers, ataxia, a-fib, diverticulosis, PVD, MRSA, transfusions, excessive falls    PT Comments    Pt in bed, awake.  Reports no pain from fall yesterday.  Chart reviewed HgB noted to be 7.2 but pt with no complaints.  BP taken by nurse tech during session 87/52.  Session was limited by writer due to BP and HgB but pt did want to participate in supine exercises.  Session focused on R knee ROM and education.    Follow Up Recommendations  CIR     Equipment Recommendations  None recommended by PT    Recommendations for Other Services       Precautions / Restrictions Precautions Precautions: Fall Precaution Comments: watch HgB Restrictions Weight Bearing Restrictions: No    Mobility  Bed Mobility               General bed mobility comments: deferred dueto HgB and BP  Transfers                    Ambulation/Gait                 Stairs             Wheelchair Mobility    Modified Rankin (Stroke Patients Only)       Balance                                            Cognition Arousal/Alertness: Awake/alert Behavior During Therapy: WFL for tasks assessed/performed Overall Cognitive Status: Impaired/Different from baseline                                        Exercises Other Exercises Other Exercises: Low level bed exercises for BLE for SLR, Knee flexion, ab/add x 10    General Comments        Pertinent Vitals/Pain Pain Assessment: No/denies pain    Home Living                      Prior Function           PT Goals (current goals can now be found in the care plan section) Progress towards PT goals: Progressing toward goals    Frequency    Min 2X/week      PT Plan Current plan remains appropriate    Co-evaluation              AM-PAC PT "6 Clicks" Mobility   Outcome Measure  Help needed turning from your back to your side while in a flat bed without using bedrails?: A Lot Help needed moving from lying on your back to sitting on the side of a flat bed without using bedrails?: Total Help needed moving to and from a bed to a chair (including a wheelchair)?: Total Help needed standing up from a chair using your arms (e.g., wheelchair or bedside chair)?:  Total Help needed to walk in hospital room?: Total Help needed climbing 3-5 steps with a railing? : Total 6 Click Score: 7    End of Session   Activity Tolerance: Treatment limited secondary to medical complications (Comment) Patient left: in bed;with bed alarm set;with call bell/phone within reach   Pain - Right/Left: Right Pain - part of body: Leg     Time: 5800-6349 PT Time Calculation (min) (ACUTE ONLY): 11 min  Charges:  $Therapeutic Exercise: 8-22 mins                    Chesley Noon, PTA 07/06/18, 8:47 AM

## 2018-07-06 NOTE — Evaluation (Addendum)
Occupational Therapy Evaluation Patient Details Name: SAHIAN KERNEY MRN: [id number] DOB: 1940-10-07 Today's Date: 07/06/2018    History of Present Illness Pt. 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 PMHx:  COPD, RA, foot ulcers, ataxia, a-fib, diverticulosis, PVD, MRSA, transfusions, excessive falls   Clinical Impression   Pt. Presents with 5/10 RLE pain, weakness, limited activity tolerance, and limited functional mobility which limits her ability to complete basic ADL and IADL functioning. Imaging revealed slight widening of the right scapholunate distance following a fall out of bed yesterday. Head CT scan revealed soft tissue hematoma, No intracranial abnormalities.  Pt. resides at home with her husband. Pt. was independent with ADLs, and IADL functioning prior to her initial LBKA surgery. Since the surgery she required assist with ADL, IADLs, and transfers. Pt. spent most of her time between her w/c and her chair. Pt. Requires assist with basic ADLs. Pt. edcuation was provide about basic reacher use. Pt. Could benefit from OT services for ADL training, A/E training, and pt. Education about home modification, and DME. Pt. would benefit from SNF level of care, and follow-up OT services at discharge.    Follow Up Recommendations  SNF    Equipment Recommendations  3 in 1 bedside commode    Recommendations for Other Services       Precautions / Restrictions Precautions Precautions: Fall Precaution Comments: watch HgB Restrictions Weight Bearing Restrictions: No      Mobility Bed Mobility               Transfers        Pt. Seen for bedside eval              Balance                                           ADL either performed or assessed with clinical judgement   ADL Overall ADL's : Needs assistance/impaired Eating/Feeding: Set up;Independent   Grooming: Set up;Minimal  assistance   Upper Body Bathing: Set up;Maximal assistance   Lower Body Bathing: Set up;Moderate assistance   Upper Body Dressing : Set up;Minimal assistance   Lower Body Dressing: Set up;Maximal assistance   Toilet Transfer: Set up;Maximal assistance                   Vision  Intact. No change from baseline       Perception     Praxis      Pertinent Vitals/Pain Pain Assessment: 0-10 Pain Score: 5  Pain Location: Right LE Pain Descriptors / Indicators: Operative site guarding Pain Intervention(s): Limited activity within patient's tolerance;Monitored during session;Repositioned     Hand Dominance Right   Extremity/Trunk Assessment Upper Extremity Assessment Upper Extremity Assessment: Generalized weakness           Communication Communication Communication: No difficulties   Cognition Arousal/Alertness: Awake/alert Behavior During Therapy: WFL for tasks assessed/performed Overall Cognitive Status: Impaired/Different from baseline Area of Impairment: Safety/judgement;Awareness;Problem solving;Following commands                           Awareness: Intellectual Problem Solving: Requires verbal cues     General Comments       Exercises   Shoulder Instructions      Home Living  Family/patient expects to be discharged to:: Private residence Living Arrangements: Spouse/significant other Available Help at Discharge: Family Type of Home: House Home Access: [place]: One level     Bathroom Shower/Tub: Teacher, early years/pre: Handicapped height Bathroom Accessibility: Yes   Home Equipment: Grab bars - toilet;Toilet riser   Additional Comments: Pt lives with husband who cannot provide physical assist but will be able to assist with providing meals and bringing pt anything she needs.  Two children live very close and will be able to do the grocery shopping and running errands.       Prior  Functioning/Environment Level of Independence: Needs assistance    ADL's / Homemaking Assistance Needed: Pt. reports independence prior to the initial LBKA.  Required assist with ADLs, and IADL since MAy 2019.            OT Problem List: Decreased strength;Decreased activity tolerance;Decreased knowledge of use of DME or AE;Pain      OT Treatment/Interventions: Therapeutic exercise;Patient/family education;Therapeutic activities;Self-care/ADL training;DME and/or AE instruction    OT Goals(Current goals can be found in the care plan section) Acute Rehab OT Goals Patient Stated Goal: To be able to do the tings she was able to do before OT Goal Formulation: With patient Potential to Achieve Goals: Good ADL Goals Pt Will Perform Grooming: Independently Pt Will Perform Lower Body Dressing: with min assist Pt Will Transfer to Toilet: with min assist  OT Frequency: Min 1X/week   Barriers to D/C:            Co-evaluation              AM-PAC OT "6 Clicks" Daily Activity     Outcome Measure Help from another person eating meals?: None Help from another person taking care of personal grooming?: None Help from another person toileting, which includes using toliet, bedpan, or urinal?: A Lot Help from another person bathing (including washing, rinsing, drying)?: A Lot Help from another person to put on and taking off regular upper body clothing?: None Help from another person to put on and taking off regular lower body clothing?: A Lot 6 Click Score: 18   End of Session Equipment Utilized During Treatment: Gait belt  Activity Tolerance: Patient tolerated treatment well Patient left: in bed  OT Visit Diagnosis: Unsteadiness on feet (R26.81);Muscle weakness (generalized) (M62.81)                Time: 4383-8184 OT Time Calculation (min): 28 min Charges:  OT General Charges $OT Visit: 1 Visit OT Evaluation $OT Eval Moderate Complexity: 1 Mod Harrel Carina, MS,  OTR/L  Harrel Carina 07/06/2018, 12:39 PM

## 2018-07-06 NOTE — Progress Notes (Signed)
Went in to give patient her 7 am Gapapentin.  Patient was to sleepy to take it. Will continue to monitor.

## 2018-07-06 NOTE — Progress Notes (Signed)
Assumed care of patient at 23:00. Patient has slept the entire night without waking up. I rounded on patient every hour and no distress noted, will continue to monitor.

## 2018-07-06 NOTE — Progress Notes (Signed)
5 Days Post-Op   Subjective/Chief Complaint: Doing OK. Notes mild discomfort in RIGHT BKA stump site. Continued evaluation/treatment for sundowning and fall.   Objective: Vital signs in last 24 hours: Temp:  [98.6 F (37 C)-100.2 F (37.9 C)] 98.6 F (37 C) (12/21 0837) Pulse Rate:  [85-113] 102 (12/21 0837) BP: (87-130)/(52-85) 87/52 (12/21 0837) SpO2:  [91 %-100 %] 96 % (12/21 0837) Last BM Date: 07/05/18  Intake/Output from previous day: 12/20 0701 - 12/21 0700 In: -  Out: 400 [Urine:400] Intake/Output this shift: No intake/output data recorded.  General appearance: alert and no distress Cardio: irregularly irregular rhythm Extremities: Right Lower Extremity: Thigh soft. Flexible at knee joint. OR dressing removed. Blister with clear fluid noted on bottom of stump. Incision clean dry and intact.   Lab Results:  Recent Labs    07/05/18 0345 07/06/18 0404  WBC 15.2* 11.8*  HGB 7.8* 7.2*  HCT 26.2* 23.8*  PLT 377 323   BMET Recent Labs    07/05/18 0345 07/06/18 0404  NA 137 135  K 4.5 4.5  CL 101 100  CO2 22 25  GLUCOSE 105* 134*  BUN 20 24*  CREATININE 0.52 0.51  CALCIUM 9.2 9.1   PT/INR No results for input(s): LABPROT, INR in the last 72 hours. ABG No results for input(s): PHART, HCO3 in the last 72 hours.  Invalid input(s): PCO2, PO2  Studies/Results: Dg Wrist 2 Views Right  Result Date: 07/05/2018 CLINICAL DATA:  Fall, landed on right wrist.  Pain EXAM: RIGHT WRIST - 2 VIEW COMPARISON:  None. 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 Diffuse vascular calcifications. No fracture. Slight widening of the scapholunate distance. IMPRESSION: Degenerative changes in the right wrist.  No fracture. Slight widening of the scapholunate distance could reflect scapholunate dissociation. Electronically Signed   By: Rolm Baptise M.D.   On: 07/05/2018 10:35   Ct Head Wo Contrast  Result Date:  07/05/2018 CLINICAL DATA:  Fall EXAM: CT HEAD WITHOUT CONTRAST TECHNIQUE: Contiguous axial images were obtained from the base of the skull through the vertex without intravenous contrast. COMPARISON:  06/08/2017 FINDINGS: Brain: Mild global atrophy appropriate to age. Minimal chronic ischemic changes in the periventricular white matter. There is no mass effect, midline shift, or acute intracranial hemorrhage. Vascular: No hyperdense vessel or unexpected calcification. Skull: The cranium is intact. Sinuses/Orbits: Visualized mastoid air cells and paranasal sinuses are clear. Orbits are grossly within normal limits. Other: Soft tissue hematoma over the right temporal bone is noted. IMPRESSION: No acute intracranial pathology. Electronically Signed   By: Marybelle Killings M.D.   On: 07/05/2018 09:03   Dg Chest Port 1 View  Result Date: 07/04/2018 CLINICAL DATA:  Chest pain EXAM: PORTABLE CHEST 1 VIEW COMPARISON:  12/14/2017 FINDINGS: Heart is normal size. Chronic increased markings throughout the lungs, likely scarring. No effusions or confluent opacities. No acute bony abnormality. IMPRESSION: Chronic changes.  No active disease. Electronically Signed   By: Rolm Baptise M.D.   On: 07/04/2018 11:07    Anti-infectives: Anti-infectives (From admission, onward)   Start     Dose/Rate Route Frequency Ordered Stop   07/01/18 2300  ceFAZolin (ANCEF) IVPB 1 g/50 mL premix    Note to Pharmacy:  Send with pt to OR   1 g 100 mL/hr over 30 Minutes Intravenous Every 8 hours 07/01/18 1731 07/02/18 1536   07/01/18 0500  ceFAZolin (ANCEF) IVPB 2g/100 mL premix    Note to Pharmacy:  Send to  OR with patient please.   2 g 200 mL/hr over 30 Minutes Intravenous On call 06/28/18 1458 07/01/18 1454   06/28/18 0500  ceFAZolin (ANCEF) IVPB 2g/100 mL premix    Note to Pharmacy:  Please send with patient to specials   2 g 200 mL/hr over 30 Minutes Intravenous  Once 06/27/18 1024 06/28/18 0508   06/26/18 2200   hydroxychloroquine (PLAQUENIL) tablet 200 mg     200 mg Oral 2 times daily 06/26/18 1752     06/26/18 1115  ceFAZolin (ANCEF) IVPB 2g/100 mL premix     2 g 200 mL/hr over 30 Minutes Intravenous  Once 06/26/18 1113 06/26/18 1526      Assessment/Plan: s/p Procedure(s): AMPUTATION BELOW KNEE (Right) Continue daily dressing changes to RIGHT BKA site.  Sundowning- limit narcotics. Further treatment per Medicine.  LOS: 10 days    Evaristo Bury 07/06/2018

## 2018-07-06 NOTE — Progress Notes (Signed)
[place] at [place] NAME: Melissa [name]    MR#:  [id number]   DATE OF BIRTH:  [birth year]  SUBJECTIVE:  Complains of lower extremity pain only, no events overnight, hemoglobin noted-for transfusion  REVIEW OF SYSTEMS:    Review of Systems  Constitutional: Negative for fever, chills weight loss HENT: Negative for ear pain, nosebleeds, congestion, facial swelling, rhinorrhea, neck pain, neck stiffness and ear discharge.   Respiratory: Negative for cough, shortness of breath, wheezing  Cardiovascular: Negative for chest pain, palpitations and leg swelling.  Gastrointestinal: Negative for heartburn, abdominal pain, vomiting, diarrhea or consitpation Genitourinary: Negative for dysuria, urgency, frequency, hematuria Musculoskeletal: Negative for back pain or joint pain ++Wrist pain Neurological: Negative for dizziness, seizures, syncope, focal weakness,  numbness and headaches.  Hematological: Does not bruise/bleed easily.  Psychiatric/Behavioral: Negative for hallucinations, confusion, dysphoric mood    Tolerating Diet: yes      DRUG ALLERGIES:   Allergies  Allergen Reactions  . Lodine [Etodolac] Hives and Itching  . Methotrexate Derivatives Other (See Comments)    Flu like symptoms  . Nitrofuran Derivatives Nausea And Vomiting and Other (See Comments)    Made sicker   . Levaquin [Levofloxacin] Itching, Rash and Other (See Comments)    Muscle pain  . Norvasc [Amlodipine] Other (See Comments)    Unknown   . Pentasa [Mesalamine Er] Other (See Comments)    Unknown   . Sulfa Antibiotics Itching and Rash  . Vioxx [Rofecoxib] Other (See Comments)    Unknown     VITALS:  Blood pressure (!) 87/52, pulse (!) 102, temperature 98.6 F (37 C), temperature source Oral, resp. rate 16, height 5' 4"  (1.626 m), weight 47.2 kg, SpO2 96 %.  PHYSICAL EXAMINATION:  Constitutional: Appears well-developed and well-nourished. No distress. HENT:  Normocephalic. Marland Kitchen Oropharynx is clear and moist.  Eyes: Conjunctivae and EOM are normal. PERRLA, no scleral icterus.  Neck: Normal ROM. Neck supple. No JVD. No tracheal deviation. DSK:AJGOT   no murmurs, no gallops, no carotid bruit.  Pulmonary: Effort and breath sounds normal, no stridor, rhonchi, wheezes, rales.  Abdominal: Soft. BS +,  no distension, tenderness, rebound or guarding.  Musculoskeletal: b/l BKA dressing placed Neuro: Alert. CN 2-12 grossly intact. No focal deficits. Skin: Skin is warm and dry. No rash noted. Psychiatric: Normal mood and affect.      LABORATORY PANEL:   CBC Recent Labs  Lab 07/06/18 0404  WBC 11.8*  HGB 7.2*  HCT 23.8*  PLT 323   ------------------------------------------------------------------------------------------------------------------  Chemistries  Recent Labs  Lab 07/06/18 0404  NA 135  K 4.5  CL 100  CO2 25  GLUCOSE 134*  BUN 24*  CREATININE 0.51  CALCIUM 9.1  MG 2.1   ------------------------------------------------------------------------------------------------------------------  Cardiac Enzymes Recent Labs  Lab 07/04/18 1107 07/04/18 1614 07/04/18 2237  TROPONINI 0.04* 0.03* 0.04*   ------------------------------------------------------------------------------------------------------------------  RADIOLOGY:  Dg Wrist 2 Views Right  Result Date: 07/05/2018 CLINICAL DATA:  Fall, landed on right wrist.  Pain EXAM: RIGHT WRIST - 2 VIEW COMPARISON:  None. 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 Diffuse vascular calcifications. No fracture. Slight widening of the scapholunate distance. IMPRESSION: Degenerative changes in the right wrist.  No fracture. Slight widening of the scapholunate distance could reflect scapholunate dissociation. Electronically Signed   By: Rolm Baptise M.D.   On: 07/05/2018 10:35   Ct Head Wo Contrast  Result Date: 07/05/2018 CLINICAL  DATA:  Fall EXAM:  CT HEAD WITHOUT CONTRAST TECHNIQUE: Contiguous axial images were obtained from the base of the skull through the vertex without intravenous contrast. COMPARISON:  06/08/2017 FINDINGS: Brain: Mild global atrophy appropriate to age. Minimal chronic ischemic changes in the periventricular white matter. There is no mass effect, midline shift, or acute intracranial hemorrhage. Vascular: No hyperdense vessel or unexpected calcification. Skull: The cranium is intact. Sinuses/Orbits: Visualized mastoid air cells and paranasal sinuses are clear. Orbits are grossly within normal limits. Other: Soft tissue hematoma over the right temporal bone is noted. IMPRESSION: No acute intracranial pathology. Electronically Signed   By: Marybelle Killings M.D.   On: 07/05/2018 09:03     ASSESSMENT AND PLAN:  77 year old female with history of PAD and PAF.  *chronic P afib Stable Continue diltiazem, aspirin and Eliquis  * Acute on chronic anemia Transfuse 1 unit packed red blood cells  *PAD s/p b/l BKA Vascular surgery input appreciated-continue dressing changes For skilled nursing placement on Monday  *Hyperlipidemia Stable continue Lopid/pravastatin  *Chronic moderate malnutrition Dietary consulted  continue feeding supplementation   *Chronic  GAD Stable  Continue Ativan and Zoloft  *Fall with wrist pain  Noted x-ray findings for slight widening of the scapholunate distance could reflect scapholunate dissociation Seen by orthopedic surgery-no intervention recommended, patient using her wrist without difficulty/pain No further work-up indicated for x-ray findings  Management plans discussed with the patient and family and they are is in agreement.  CODE STATUS: Full  TOTAL TIME TAKING CARE OF THIS PATIENT: 26 minutes.    PT for d/c planning   POSSIBLE D/C 2 days, DEPENDING ON CLINICAL CONDITION.   [name] Manuelita Moxon M.D on 07/06/2018 at 1:32 PM  Between 7am to 6pm - Pager -  [phone] After 6pm go to [email or web address] - password [email or web address] [facility]  Office  [phone]  CC: Primary care physician; [name], MD  Note: This dictation was prepared with Dragon dictation along with smaller phrase technology. Any transcriptional errors that result from this process are unintentional.

## 2018-07-07 LAB — BPAM RBC
Blood Product Expiration Date: 202001182359
ISSUE DATE / TIME: 201912211722
Unit Type and Rh: 6200

## 2018-07-07 LAB — CBC
HCT: 31.1 % — ABNORMAL LOW (ref 36.0–46.0)
Hemoglobin: 9.9 g/dL — ABNORMAL LOW (ref 12.0–15.0)
MCH: 29.8 pg (ref 26.0–34.0)
MCHC: 31.8 g/dL (ref 30.0–36.0)
MCV: 93.7 fL (ref 80.0–100.0)
Platelets: 342 10*3/uL (ref 150–400)
RBC: 3.32 MIL/uL — ABNORMAL LOW (ref 3.87–5.11)
RDW: 15.6 % — ABNORMAL HIGH (ref 11.5–15.5)
WBC: 12.6 10*3/uL — ABNORMAL HIGH (ref 4.0–10.5)
nRBC: 0 % (ref 0.0–0.2)

## 2018-07-07 LAB — BASIC METABOLIC PANEL
Anion gap: 13 (ref 5–15)
BUN: 26 mg/dL — ABNORMAL HIGH (ref 8–23)
CO2: 25 mmol/L (ref 22–32)
Calcium: 9.2 mg/dL (ref 8.9–10.3)
Chloride: 96 mmol/L — ABNORMAL LOW (ref 98–111)
Creatinine, Ser: 0.59 mg/dL (ref 0.44–1.00)
GFR calc Af Amer: >60 mL/min (ref >60)
GFR calc non Af Amer: >60 mL/min (ref >60)
Glucose, Bld: 131 mg/dL — ABNORMAL HIGH (ref 70–99)
POTASSIUM: 4.2 mmol/L (ref 3.5–5.1)
Sodium: 134 mmol/L — ABNORMAL LOW (ref 135–145)

## 2018-07-07 LAB — TYPE AND SCREEN
ABO/RH(D): A POS
Antibody Screen: NEGATIVE
Unit division: 0

## 2018-07-07 LAB — MAGNESIUM: Magnesium: 2.1 mg/dL (ref 1.7–2.4)

## 2018-07-07 NOTE — Progress Notes (Signed)
[facility] at [place] NAME: Melissa [name]    MR#:  [id number]   DATE OF BIRTH:  [birth year]  SUBJECTIVE:  Intermittent confusion overnight which is typical during hospitalizations per daughter, nurse in the room as well, vascular surgery input appreciated, awaiting [facility] facility placement [facility] when bed is available  REVIEW OF SYSTEMS:    Review of Systems  Constitutional: Negative for fever, chills weight loss HENT: Negative for ear pain, nosebleeds, congestion, facial swelling, rhinorrhea, neck pain, neck stiffness and ear discharge.   Respiratory: Negative for cough, shortness of breath, wheezing  Cardiovascular: Negative for chest pain, palpitations and leg swelling.  Gastrointestinal: Negative for heartburn, abdominal pain, vomiting, diarrhea or consitpation Genitourinary: Negative for dysuria, urgency, frequency, hematuria Musculoskeletal: Negative for back pain or joint pain ++Wrist pain Neurological: Negative for dizziness, seizures, syncope, focal weakness,  numbness and headaches.  Hematological: Does not bruise/bleed easily.  Psychiatric/Behavioral: Negative for hallucinations, confusion, dysphoric mood Tolerating Diet: yes DRUG ALLERGIES:   Allergies  Allergen Reactions  . Lodine [Etodolac] Hives and Itching  . Methotrexate Derivatives Other (See Comments)    Flu like symptoms  . Nitrofuran Derivatives Nausea And Vomiting and Other (See Comments)    Made sicker   . Levaquin [Levofloxacin] Itching, Rash and Other (See Comments)    Muscle pain  . Norvasc [Amlodipine] Other (See Comments)    Unknown   . Pentasa [Mesalamine Er] Other (See Comments)    Unknown   . Sulfa Antibiotics Itching and Rash  . Vioxx [Rofecoxib] Other (See Comments)    Unknown     VITALS:  Blood pressure (!) 106/53, pulse 96, temperature 98.3 F (36.8 C), temperature source Oral, resp. rate 18, height 5' 4"  (1.626 m), weight 47.2 kg, SpO2 95  %.  PHYSICAL EXAMINATION:  Constitutional: Appears well-developed and well-nourished. No distress. HENT: Normocephalic. Marland Kitchen Oropharynx is clear and moist.  Eyes: Conjunctivae and EOM are normal. PERRLA, no scleral icterus.  Neck: Normal ROM. Neck supple. No JVD. No tracheal deviation. OMV:EHMCN   no murmurs, no gallops, no carotid bruit.  Pulmonary: Effort and breath sounds normal, no stridor, rhonchi, wheezes, rales.  Abdominal: Soft. BS +,  no distension, tenderness, rebound or guarding.  Musculoskeletal: b/l BKA dressing placed Neuro: Alert. CN 2-12 grossly intact. No focal deficits. Skin: Skin is warm and dry. No rash noted. Psychiatric: Normal mood and affect.  LABORATORY PANEL:   CBC Recent Labs  Lab 07/07/18 0429  WBC 12.6*  HGB 9.9*  HCT 31.1*  PLT 342   ------------------------------------------------------------------------------------------------------------------  Chemistries  Recent Labs  Lab 07/07/18 0429  NA 134*  K 4.2  CL 96*  CO2 25  GLUCOSE 131*  BUN 26*  CREATININE 0.59  CALCIUM 9.2  MG 2.1   ------------------------------------------------------------------------------------------------------------------  Cardiac Enzymes Recent Labs  Lab 07/04/18 1107 07/04/18 1614 07/04/18 2237  TROPONINI 0.04* 0.03* 0.04*   ------------------------------------------------------------------------------------------------------------------  RADIOLOGY:  No results found. ASSESSMENT AND PLAN:  77 year old female with history of PAD and PAF.  *chronic P afib Stable Continue diltiazem, aspirin and Eliquis  * Acute on chronic anemia Proved status post packed red blood cell transfusion, hemoglobin 9.9   *PAD s/p b/l BKA Vascular surgery input appreciated-continue dressing changes For skilled nursing placement on Monday if bed is available  *Hyperlipidemia Stable continue Lopid/pravastatin  *Chronic moderate malnutrition Dietary consulted  continue  feeding supplementation   *Chronic  GAD Stable  Continue Ativan and Zoloft  *Fall with wrist pain  Noted x-ray findings for slight widening of the scapholunate distance could reflect scapholunate dissociation Seen by orthopedic surgery-no intervention recommended, patient using her wrist without difficulty/pain No further work-up indicated for x-ray findings  Management plans discussed with the patient and family and they are is in agreement.  CODE STATUS: Full  TOTAL TIME TAKING CARE OF THIS PATIENT: 26 minutes.    PT for d/c planning   POSSIBLE D/C 1-2 days, DEPENDING ON CLINICAL CONDITION.   [name] Netta Fodge M.D on 07/07/2018 at 1:32 PM  Between 7am to 6pm - Pager - [phone] After 6pm go to [email or web address] - password [email or web address] [facility]  Office  [phone]  CC: Primary care physician; [name], MD  Note: This dictation was prepared with Dragon dictation along with smaller phrase technology. Any transcriptional errors that result from this process are unintentional.

## 2018-07-07 NOTE — Clinical Social Work Note (Signed)
CSW presented bed offers to the patient's daughter, [name]. [name] chose [facility]. CSW has updated Otila Kluver with [facility] to begin [employer] and has selected the choice in the [place]. CSW will follow for discharge facilitation.  Santiago Bumpers, MSW, Latanya Presser [phone]

## 2018-07-07 NOTE — Progress Notes (Signed)
6 Days Post-Op   Subjective/Chief Complaint: Notes RIGHT BKA stump discomfort-improved. Otherwise without complaint.   Objective: Vital signs in last 24 hours: Temp:  [97.8 F (36.6 C)-98.8 F (37.1 C)] 98.3 F (36.8 C) (12/22 0838) Pulse Rate:  [76-97] 96 (12/22 0838) Resp:  [18] 18 (12/22 0322) BP: (91-113)/(40-60) 106/53 (12/22 0838) SpO2:  [94 %-100 %] 95 % (12/22 0838) Last BM Date: 07/05/18  Intake/Output from previous day: 12/21 0701 - 12/22 0700 In: 628 [P.O.:240; Blood:388] Out: 900 [Urine:900] Intake/Output this shift: No intake/output data recorded.  General appearance: alert and no distress Cardio: irregularly irregular rhythm Extremities: RIGHT stump- ecchymosis, blister dry/resolved- mid incision- dry eschar, soft, incision- C/D/I  Lab Results:  Recent Labs    07/06/18 0404 07/07/18 0429  WBC 11.8* 12.6*  HGB 7.2* 9.9*  HCT 23.8* 31.1*  PLT 323 342   BMET Recent Labs    07/06/18 0404 07/07/18 0429  NA 135 134*  K 4.5 4.2  CL 100 96*  CO2 25 25  GLUCOSE 134* 131*  BUN 24* 26*  CREATININE 0.51 0.59  CALCIUM 9.1 9.2   PT/INR No results for input(s): LABPROT, INR in the last 72 hours. ABG No results for input(s): PHART, HCO3 in the last 72 hours.  Invalid input(s): PCO2, PO2  Studies/Results: Dg Wrist 2 Views Right  Result Date: 07/05/2018 CLINICAL DATA:  Fall, landed on right wrist.  Pain EXAM: RIGHT WRIST - 2 VIEW COMPARISON:  None. 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 Diffuse vascular calcifications. No fracture. Slight widening of the scapholunate distance. IMPRESSION: Degenerative changes in the right wrist.  No fracture. Slight widening of the scapholunate distance could reflect scapholunate dissociation. Electronically Signed   By: Rolm Baptise M.D.   On: 07/05/2018 10:35    Anti-infectives: Anti-infectives (From admission, onward)   Start     Dose/Rate Route Frequency  Ordered Stop   07/01/18 2300  ceFAZolin (ANCEF) IVPB 1 g/50 mL premix    Note to Pharmacy:  Send with pt to OR   1 g 100 mL/hr over 30 Minutes Intravenous Every 8 hours 07/01/18 1731 07/02/18 1536   07/01/18 0500  ceFAZolin (ANCEF) IVPB 2g/100 mL premix    Note to Pharmacy:  Send to OR with patient please.   2 g 200 mL/hr over 30 Minutes Intravenous On call 06/28/18 1458 07/01/18 1454   06/28/18 0500  ceFAZolin (ANCEF) IVPB 2g/100 mL premix    Note to Pharmacy:  Please send with patient to specials   2 g 200 mL/hr over 30 Minutes Intravenous  Once 06/27/18 1024 06/28/18 0508   06/26/18 2200  hydroxychloroquine (PLAQUENIL) tablet 200 mg     200 mg Oral 2 times daily 06/26/18 1752     06/26/18 1115  ceFAZolin (ANCEF) IVPB 2g/100 mL premix     2 g 200 mL/hr over 30 Minutes Intravenous  Once 06/26/18 1113 06/26/18 1526      Assessment/Plan: s/p Procedure(s): AMPUTATION BELOW KNEE (Right) Continue daily dressing changes. OK from Vascular standpoint for discharge when medically stable. Follow up 3-4 weeks.  LOS: 11 days    [name] A 07/07/2018

## 2018-07-08 DIAGNOSIS — Z89511 Acquired absence of right leg below knee: Secondary | ICD-10-CM

## 2018-07-08 MED ORDER — LORAZEPAM 0.5 MG PO TABS
0.5000 mg | ORAL_TABLET | Freq: Three times a day (TID) | ORAL | Status: DC | PRN
  Administered 2018-07-10 – 2018-07-11 (×2): 0.5 mg via ORAL
  Filled 2018-07-08 (×2): qty 1

## 2018-07-08 MED ORDER — LORAZEPAM 1 MG PO TABS
1.0000 mg | ORAL_TABLET | Freq: Every day | ORAL | Status: DC
  Administered 2018-07-08 – 2018-07-10 (×3): 1 mg via ORAL
  Filled 2018-07-08 (×3): qty 1

## 2018-07-08 NOTE — Progress Notes (Signed)
[facility]  Daily Progress Note   Subjective  - 7 Days Post-Op  Patient's primary c/o is that she is not sleeping at all Pain well controlled today  Objective Vitals:   07/07/18 2019 07/07/18 2020 07/08/18 0648 07/08/18 0826  BP: 91/61 (!) 108/58 116/74 114/69  Pulse: 89 83 68 99  Resp: 20  18   Temp: 99.1 F (37.3 C)  98.7 F (37.1 C) 98.4 F (36.9 C)  TempSrc: Oral     SpO2: 94% 97% 91% 95%  Weight:      Height:       No intake or output data in the 24 hours ending 07/08/18 1646  PULM  Normal effort , no use of accessory muscles CV  No JVD, RRR Abd      No distended, nontender VASC   Stump dressing removed blistered area of the posterior flap is much improved over Friday there is a small <1 cm area of epidermallysis that dry  Laboratory CBC    Component Value Date/Time   WBC 12.6 (H) 07/07/2018 0429   HGB 9.9 (L) 07/07/2018 0429   HGB 11.6 (L) 06/16/2013 1031   HCT 31.1 (L) 07/07/2018 0429   HCT 34.8 (L) 06/16/2013 1031   PLT 342 07/07/2018 0429   PLT 226 06/16/2013 1031    BMET    Component Value Date/Time   NA 134 (L) 07/07/2018 0429   NA 139 06/16/2013 1031   K 4.2 07/07/2018 0429   K 3.8 06/16/2013 1031   CL 96 (L) 07/07/2018 0429   CL 105 06/16/2013 1031   CO2 25 07/07/2018 0429   CO2 30 06/16/2013 1031   GLUCOSE 131 (H) 07/07/2018 0429   GLUCOSE 96 06/16/2013 1031   BUN 26 (H) 07/07/2018 0429   BUN 23 (H) 06/16/2013 1031   CREATININE 0.59 07/07/2018 0429   CREATININE 0.58 (L) 06/16/2013 1031   CALCIUM 9.2 07/07/2018 0429   CALCIUM 9.7 06/16/2013 1031   GFRNONAA >60 07/07/2018 0429   GFRNONAA >60 06/16/2013 1031   GFRAA >60 07/07/2018 0429   GFRAA >60 06/16/2013 1031    Assessment/Planning: POD #7 s/p right BKA   The patient has requested 1 mg of Ativan at night to help with sleep.  This is her usual home dose so I will order that  Continue daily dressing changes  Hopefully to [facility] tomorrow    [name]  07/08/2018, 4:46 PM

## 2018-07-08 NOTE — Progress Notes (Signed)
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  Remains appropriate   GOAL:   Patient will meet greater than or equal to 90% of their needs  progressing  MONITOR:   PO intake, Supplement acceptance, Labs, Weight trends, Skin, I & O's  REASON FOR ASSESSMENT:   (Follow up) Assessment of nutrition requirement/status  ASSESSMENT:   77y.o. female with a known history of L BKA, severe peripheral vascular/peripheral arterial disease admitted for right lower extremity gangrene now s/p R BKA 12/16  Patient awake with daughter in room at visit. Patient continues to report poor PO and appetite. Daughter stated that she attempts to eat at meal times and after a few bites she no longer wants anything. Patient is drinking 1 Ensure/day and daughter stated today her mother had agreed to drinking 2. Patient reports liking MC and interested in other flavors.   RD suggested adding nourishment snacks in between meals and patient liked the idea of having a fruit and cheese tray.   Medications: Oscal with D, Colace, Ferrous sulfate, Gabapentin, MVI, vitC, Ativan  Labs: Na 134(L) - replacing  NUTRITION - FOCUSED PHYSICAL EXAM:    Most Recent Value  Orbital Region  Moderate depletion  Upper Arm Region  Severe depletion  Thoracic and Lumbar Region  Severe depletion  Buccal Region  Moderate depletion  Temple Region  Moderate depletion  Clavicle Bone Region  Moderate depletion  Clavicle and Acromion  Bone Region  Moderate depletion  Scapular Bone Region  Moderate depletion  Dorsal Hand  Severe depletion  Patellar Region  Severe depletion  Anterior Thigh Region  Severe depletion  Posterior Calf Region  Unable to assess  Edema (RD Assessment)  Mild  Hair  Reviewed  Eyes  Reviewed  Mouth  Reviewed  Skin  Reviewed  Nails  Reviewed       Diet Order:  25% x 3 recorded meals since initial assessment Diet Order            Diet regular Room service appropriate? Yes; Fluid consistency: Thin  Diet effective now              EDUCATION NEEDS:   Education needs have been addressed  Skin:  Skin Assessment: Reviewed RN Assessment(incision; closed rt leg; ecchymosis)  Last BM:  07/07/2018 Type 6; [facility]  Height:   Ht Readings from Last 1 Encounters:  06/27/18 5' 4"  (1.626 m)    Weight: 103.8 lb  Wt Readings from Last 1 Encounters:  07/05/18 47.2 kg   Wt Readings from Last 3 Encounters:  07/05/18 47.2 kg  06/24/18 56.7 kg  06/21/18 52.3 kg    Ideal Body Weight:  (S) 48 kg(adjusted for BL BKA) 105.6lb  BMI:  Body mass index is 17.85 kg/m.  Estimated Nutritional Needs:   Kcal:  1200-1400kcal/day   Protein:  76-86g/day   Fluid:  1.2L/day    SLajuan Lines RD, LDN  After Hours/Weekend Pager: [phone]

## 2018-07-08 NOTE — Progress Notes (Signed)
Per Otila Kluver [facility] liaison she started [facility] authorization today.   [employer], LCSW [phone]

## 2018-07-08 NOTE — Progress Notes (Signed)
Melissa [name]    MR#:  [id number]   DATE OF BIRTH:  1940-11-14  SUBJECTIVE:  Patient without complaint, awaiting placement  REVIEW OF SYSTEMS:    Review of Systems  Constitutional: Negative for fever, chills weight loss HENT: Negative for ear pain, nosebleeds, congestion, facial swelling, rhinorrhea, neck pain, neck stiffness and ear discharge.   Respiratory: Negative for cough, shortness of breath, wheezing  Cardiovascular: Negative for chest pain, palpitations and leg swelling.  Gastrointestinal: Negative for heartburn, abdominal pain, vomiting, diarrhea or consitpation Genitourinary: Negative for dysuria, urgency, frequency, hematuria Musculoskeletal: Negative for back pain or joint pain ++Wrist pain Neurological: Negative for dizziness, seizures, syncope, focal weakness,  numbness and headaches.  Hematological: Does not bruise/bleed easily.  Psychiatric/Behavioral: Negative for hallucinations, confusion, dysphoric mood Tolerating Diet: yes DRUG ALLERGIES:   Allergies  Allergen Reactions  . Lodine [Etodolac] Hives and Itching  . Methotrexate Derivatives Other (See Comments)    Flu like symptoms  . Nitrofuran Derivatives Nausea And Vomiting and Other (See Comments)    Made sicker   . Levaquin [Levofloxacin] Itching, Rash and Other (See Comments)    Muscle pain  . Norvasc [Amlodipine] Other (See Comments)    Unknown   . Pentasa [Mesalamine Er] Other (See Comments)    Unknown   . Sulfa Antibiotics Itching and Rash  . Vioxx [Rofecoxib] Other (See Comments)    Unknown     VITALS:  Blood pressure 114/69, pulse 99, temperature 98.4 F (36.9 C), resp. rate 18, height 5' 4"  (1.626 m), weight 47.2 kg, SpO2 95 %.  PHYSICAL EXAMINATION:  Constitutional: Appears well-developed and well-nourished. No distress. HENT: Normocephalic. Marland Kitchen Oropharynx is clear and moist.  Eyes: Conjunctivae and EOM are normal. PERRLA,  no scleral icterus.  Neck: Normal ROM. Neck supple. No JVD. No tracheal deviation. JAS:NKNLZ   no murmurs, no gallops, no carotid bruit.  Pulmonary: Effort and breath sounds normal, no stridor, rhonchi, wheezes, rales.  Abdominal: Soft. BS +,  no distension, tenderness, rebound or guarding.  Musculoskeletal: b/l BKA dressing placed Neuro: Alert. CN 2-12 grossly intact. No focal deficits. Skin: Skin is warm and dry. No rash noted. Psychiatric: Normal mood and affect.  LABORATORY PANEL:   CBC Recent Labs  Lab 07/07/18 0429  WBC 12.6*  HGB 9.9*  HCT 31.1*  PLT 342   ------------------------------------------------------------------------------------------------------------------  Chemistries  Recent Labs  Lab 07/07/18 0429  NA 134*  K 4.2  CL 96*  CO2 25  GLUCOSE 131*  BUN 26*  CREATININE 0.59  CALCIUM 9.2  MG 2.1   ------------------------------------------------------------------------------------------------------------------  Cardiac Enzymes Recent Labs  Lab 07/04/18 1107 07/04/18 1614 07/04/18 2237  TROPONINI 0.04* 0.03* 0.04*   ------------------------------------------------------------------------------------------------------------------  RADIOLOGY:  No results found. ASSESSMENT AND PLAN:  77 year old female with history of PAD and PAF.  *chronic P afib Stable Continue diltiazem, aspirin and Eliquis  * Acute on chronic anemia Proved status post packed red blood cell transfusion, hemoglobin 9.9   *PAD s/p b/l BKA Vascular surgery input appreciated-continue dressing changes For skilled nursing when bed is available   *Hyperlipidemia Stable continue Lopid/pravastatin  *Chronic moderate malnutrition Dietary consulted  continue feeding supplementation   *Chronic  GAD Stable  Continue Ativan and Zoloft  *Fall with wrist pain  Noted x-ray findings for slight widening of the scapholunate distance could reflect scapholunate dissociation Seen  by orthopedic surgery-no intervention recommended, patient using her wrist without difficulty/pain No further work-up indicated for  x-ray findings  Management plans discussed with the patient and family and they are is in agreement.  CODE STATUS: Full  TOTAL TIME TAKING CARE OF THIS PATIENT: 30 minutes.    PT for d/c planning   POSSIBLE D/C 1-2 days, DEPENDING ON CLINICAL CONDITION.   Melissa [name] M.D on 07/08/2018 at 1:28 PM  Between 7am to 6pm - Pager - [phone] After 6pm go to [email or web address] - password [email or web address] [facility]  Office  [phone]  CC: Primary care physician; [name], MD  Note: This dictation was prepared with Dragon dictation along with smaller phrase technology. Any transcriptional errors that result from this process are unintentional.

## 2018-07-08 NOTE — Plan of Care (Signed)
  Problem: Clinical Measurements: Goal: Diagnostic test results will improve Outcome: Progressing Goal: Respiratory complications will improve Outcome: Progressing Note:  On room air   Problem: Activity: Goal: Risk for activity intolerance will decrease Outcome: Progressing   Problem: Nutrition: Goal: Adequate nutrition will be maintained Outcome: Progressing   Problem: Pain Managment: Goal: General experience of comfort will improve Outcome: Progressing Note:  Still complained of some leg pain from amputation, treated once with oxycodone which gave relief.   Problem: Safety: Goal: Ability to remain free from injury will improve Outcome: Progressing Note:  Low bed in place with mats   Problem: Clinical Measurements: Goal: Postoperative complications will be avoided or minimized Outcome: Progressing   Problem: Pain Management: Goal: Pain level will decrease with appropriate interventions Outcome: Progressing

## 2018-07-08 NOTE — Care Management Important Message (Signed)
Copy of signed Medicare IM left with patient in room. 

## 2018-07-09 MED ORDER — LORAZEPAM 0.5 MG PO TABS: 1.0000 mg | ORAL_TABLET | Freq: Two times a day (BID) | ORAL | 0 refills | Status: DC

## 2018-07-09 MED ORDER — GABAPENTIN 300 MG PO CAPS: 600.0000 mg | ORAL_CAPSULE | Freq: Every day | ORAL | 0 refills | Status: DC

## 2018-07-09 MED ORDER — HYDROCODONE-ACETAMINOPHEN 5-325 MG PO TABS: 1.0000 | ORAL_TABLET | Freq: Four times a day (QID) | ORAL | 0 refills | Status: AC | PRN

## 2018-07-09 MED ORDER — MORPHINE SULFATE (PF) 2 MG/ML IV SOLN
2.0000 mg | Freq: Once | INTRAVENOUS | Status: AC | PRN
  Administered 2018-07-09: 2 mg via INTRAVENOUS
  Filled 2018-07-09: qty 1

## 2018-07-09 MED ORDER — LORAZEPAM 1 MG PO TABS: 1.0000 mg | ORAL_TABLET | Freq: Every day | ORAL | 0 refills | Status: AC

## 2018-07-09 MED ORDER — TRAMADOL HCL 50 MG PO TABS: 50.0000 mg | ORAL_TABLET | Freq: Four times a day (QID) | ORAL | 0 refills | Status: AC | PRN

## 2018-07-09 MED ORDER — GABAPENTIN 300 MG PO CAPS: 300.0000 mg | ORAL_CAPSULE | Freq: Three times a day (TID) | ORAL | 0 refills | Status: AC

## 2018-07-09 MED ORDER — RISPERIDONE 1 MG PO TABS: 1.0000 mg | ORAL_TABLET | Freq: Every day | ORAL | 0 refills | Status: AC

## 2018-07-09 NOTE — Clinical Social Work Note (Signed)
CSW is still waiting on insurance authorization.  CSW to continue to follow patient's progress throughout discharge planning.  Jones Broom. Shakeena Kafer, MSW, [place]  07/09/2018 11:51 AM

## 2018-07-09 NOTE — Discharge Summary (Addendum)
[place] [facility] [place] NAME: Melissa [name]    MR#:  [id number]  DATE OF BIRTH:  12-14-1940  DATE OF ADMISSION:  06/26/2018 ADMITTING PHYSICIAN: Katha Cabal, MD  DATE OF DISCHARGE: No discharge date for patient encounter.  PRIMARY CARE PHYSICIAN: [name], MD    ADMISSION DIAGNOSIS:  Ischemic leg [I99.8]  DISCHARGE DIAGNOSIS:  Active Problems:   Ischemic leg   Protein-calorie malnutrition, severe   SECONDARY DIAGNOSIS:   Past Medical History:  Diagnosis Date  . Anemia 12/2017   blood transfusion after amputation  . Anxiety   . Arthritis    rheumatoid  . Ataxia   . Chronic ulcer of right great toe ([place]) 2019   slowly healing  . COPD (chronic obstructive pulmonary disease) ([place])   . Coronary artery disease 2018   complete blockage in one vessel, 40 % in another, 25% in 3rd vessell  . Depression   . Diverticulosis   . Dysrhythmia    atrial fib  . Excessive falling   . GERD (gastroesophageal reflux disease)    ulcerative colitis  . Hypercholesteremia   . Hyperlipidemia   . Hypertension   . IBS (irritable bowel syndrome)   . MRSA (methicillin resistant staph aureus) culture positive 11/2017   patient unaware of this diagnosis, was never informed or treated  . Myocardial infarction ([place]) 08/2017   had angio but no stents  . Osteoarthritis   . Psoriasis   . Raynaud's disease without gangrene   . Raynaud's disease without gangrene   . Shortness of breath dyspnea    with exertion  . 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  *chronic P afib Stable Continue diltiazem, aspirin and Eliquis  * Acute on chronic anemia Improved S/P packed red blood cell transfusion, hemoglobin 9.9   *PAD s/p b/l BKA By Vascular surgery  For skilled nursing facilty   *Hyperlipidemia Stable continue  Lopid/pravastatin  *Chronic moderate malnutrition Dietary consulted  continue feeding supplementation   *Chronic  GAD Stable  Continue Ativan and Zoloft  *Fall with wrist pain  Noted x-ray findings for slight widening of the scapholunate distance could reflect scapholunate dissociation Seen by orthopedic surgery-no intervention recommended, patient using her wrist without difficulty/pain No further work-up indicated for x-ray findings   DISCHARGE CONDITIONS:  stable  CONSULTS OBTAINED:  Treatment Team:  Fritzi Mandes, MD Corey Skains, MD Hessie Knows, MD  DRUG ALLERGIES:   Allergies  Allergen Reactions  . Lodine [Etodolac] Hives and Itching  . Methotrexate Derivatives Other (See Comments)    Flu like symptoms  . Nitrofuran Derivatives Nausea And Vomiting and Other (See Comments)    Made sicker   . Levaquin [Levofloxacin] Itching, Rash and Other (See Comments)    Muscle pain  . Norvasc [Amlodipine] Other (See Comments)    Unknown   . Pentasa [Mesalamine Er] Other (See Comments)    Unknown   . Sulfa Antibiotics Itching and Rash  . Vioxx [Rofecoxib] Other (See Comments)    Unknown     DISCHARGE MEDICATIONS:   Allergies as of 07/11/2018      Reactions   Lodine [etodolac] Hives, Itching   Methotrexate Derivatives Other (See Comments)   Flu like symptoms   Nitrofuran Derivatives Nausea And Vomiting, Other (See Comments)   Made sicker    Levaquin [levofloxacin] Itching, Rash, Other (See Comments)  Muscle pain   Norvasc [amlodipine] Other (See Comments)   Unknown    Pentasa [mesalamine Er] Other (See Comments)   Unknown    Sulfa Antibiotics Itching, Rash   Vioxx [rofecoxib] Other (See Comments)   Unknown       Medication List    TAKE these medications   acetaminophen 500 MG tablet Commonly known as:  TYLENOL Take 1,000 mg by mouth every 8 (eight) hours as needed for mild pain or headache.   apixaban 5 MG Tabs tablet Commonly known as:   ELIQUIS Take 1 tablet (5 mg total) by mouth 2 (two) times daily.   aspirin EC 81 MG tablet Take 1 tablet (81 mg total) by mouth daily.   budesonide 3 MG 24 hr capsule Commonly known as:  ENTOCORT EC Take 3 capsules (9 mg total) by mouth daily. Taper down to 3 mg What changed:    how much to take  additional instructions   CALCIUM 600+D 600-400 MG-UNIT tablet Generic drug:  Calcium Carbonate-Vitamin D Take 1 tablet by mouth 2 (two) times daily.   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   diltiazem 240 MG 24 hr capsule Commonly known as:  CARDIZEM CD Take 240 mg by mouth daily.   NUTRITIONAL SHAKE HIGH PROTEIN Liqd Take 237 mLs by mouth daily. Slim Fast protein   feeding supplement (ENSURE ENLIVE) Liqd Take 237 mLs by mouth 2 (two) times daily between meals.   ferrous sulfate 325 (65 FE) MG tablet Take 325 mg by mouth daily with breakfast.   gabapentin 300 MG capsule Commonly known as:  NEURONTIN Take 1 capsule (300 mg total) by mouth 3 (three) times daily. What changed:  See the new instructions.   gabapentin 300 MG capsule Commonly known as:  NEURONTIN Take 1 capsule (300 mg total) by mouth at bedtime. What changed:  You were already taking a medication with the same name, and this prescription was added. Make sure you understand how and when to take each.   gemfibrozil 600 MG tablet Commonly known as:  LOPID Take 600 mg by mouth 2 (two) times daily before a meal.   HYDROcodone-acetaminophen 5-325 MG tablet Commonly known as:  NORCO Take 1-2 tablets by mouth every 6 (six) hours as needed for moderate pain or severe pain.   hydroxychloroquine 200 MG tablet Commonly known as:  PLAQUENIL Take 200 mg by mouth 2 (two) times daily.   leflunomide 20 MG tablet Commonly known as:   ARAVA Take 20 mg by mouth daily.   lisinopril 5 MG tablet Commonly known as:  PRINIVIL,ZESTRIL Take 5 mg by mouth daily.   LORazepam 0.5 MG tablet Commonly known as:  ATIVAN Take 2 tablets (1 mg total) by mouth 2 (two) times daily. What changed:  medication strength   LORazepam 1 MG tablet Commonly known as:  ATIVAN Take 1 tablet (1 mg total) by mouth at bedtime. What changed:  You were already taking a medication with the same name, and this prescription was added. Make sure you understand how and when to take each.   multivitamin with minerals Tabs tablet Take 1 tablet by mouth daily.   mupirocin ointment 2 % Commonly known as:  BACTROBAN Apply to affected area 2 times daily   omeprazole 20 MG capsule Commonly  known as:  PRILOSEC Take 20 mg by mouth 2 (two) times daily before a meal.   pravastatin 80 MG tablet Commonly known as:  PRAVACHOL Take 80 mg by mouth at bedtime.   predniSONE 5 MG tablet Commonly known as:  DELTASONE Take 5 mg by mouth daily with breakfast.   PRESERVISION AREDS Caps Take 1 capsule by mouth 2 (two) times daily.   risperiDONE 1 MG tablet Commonly known as:  RISPERDAL Take 1 tablet (1 mg total) by mouth at bedtime.   sertraline 100 MG tablet Commonly known as:  ZOLOFT Take 100 mg by mouth daily.   traMADol 50 MG tablet Commonly known as:  ULTRAM Take 1 tablet (50 mg total) by mouth every 6 (six) hours as needed for moderate pain.        DISCHARGE INSTRUCTIONS:      If you experience worsening of your admission symptoms, develop shortness of breath, life threatening emergency, suicidal or homicidal thoughts you must seek medical attention immediately by calling 911 or calling your MD immediately  if symptoms less severe.  You Must read complete instructions/literature along with all the possible adverse reactions/side effects for all the Medicines you take and that have been prescribed to you. Take any new Medicines after you have  completely understood and accept all the possible adverse reactions/side effects.   Please note  You were cared for by a hospitalist during your hospital stay. If you have any questions about your discharge medications or the care you received while you were in the hospital after you are discharged, you can call the unit and asked to speak with the hospitalist on call if the hospitalist that took care of you is not available. Once you are discharged, your primary care physician will handle any further medical issues. Please note that NO REFILLS for any discharge medications will be authorized once you are discharged, as it is imperative that you return to your primary care physician (or establish a relationship with a primary care physician if you do not have one) for your aftercare needs so that they can reassess your need for medications and monitor your lab values.    Today   CHIEF COMPLAINT:  No chief complaint on file.   HISTORY OF PRESENT ILLNESS:   77 y.o. female with known diabetes essential hypertension mixed hyperlipidemia with significant peripheral vascular disease status post peripheral vascular disease surgery and amputation.  In the perioperative.  The patient did have some atrial fibrillation consistent with her previous paroxysmal nonvalvular atrial fibrillation.  The patient previously was on appropriate medication management for this including heart rate control with diltiazem.  She has not had any significant new symptoms of congestive heart failure or angina and heart rate has been reasonably controlled.  Since then the patient now has spontaneous conversion to normal sinus rhythm with some pre-atrial contractions.  There is been no other significant rhythm disturbances heart block at this time.  The patient has had no significant major bleeding complications from surgery.   VITAL SIGNS:  Blood pressure 124/75, pulse 85, temperature 99.6 F (37.6 C), temperature source  Oral, resp. rate 18, height 5' 4"  (1.626 m), weight 49 kg, SpO2 99 %.  I/O:    Intake/Output Summary (Last 24 hours) at 07/11/2018 1537 Last data filed at 07/11/2018 1448 Gross per 24 hour  Intake 1361.38 ml  Output 1175 ml  Net 186.38 ml    PHYSICAL EXAMINATION:  GENERAL:  77 y.o.-year-old patient lying in the bed  with no acute distress.  EYES: Pupils equal, round, reactive to light and accommodation. No scleral icterus. Extraocular muscles intact.  HEENT: Head atraumatic, normocephalic. Oropharynx and nasopharynx clear.  NECK:  Supple, no jugular venous distention. No thyroid enlargement, no tenderness.  LUNGS: Normal breath sounds bilaterally, no wheezing, rales,rhonchi or crepitation. No use of accessory muscles of respiration.  CARDIOVASCULAR: S1, S2 normal. No murmurs, rubs, or gallops.  ABDOMEN: Soft, non-tender, non-distended. Bowel sounds present. No organomegaly or mass.  EXTREMITIES: No pedal edema, cyanosis, or clubbing.  NEUROLOGIC: Cranial nerves II through XII are intact. Muscle strength 5/5 in all extremities. Sensation intact. Gait not checked.  PSYCHIATRIC: The patient is alert and oriented x 3.  SKIN: No obvious rash, lesion, or ulcer.   DATA REVIEW:   CBC Recent Labs  Lab 07/10/18 0845  WBC 17.1*  HGB 10.6*  HCT 34.2*  PLT 409*    Chemistries  Recent Labs  Lab 07/07/18 0429 07/10/18 0845  NA 134* 133*  K 4.2 4.4  CL 96* 98  CO2 25 23  GLUCOSE 131* 143*  BUN 26* 30*  CREATININE 0.59 0.75  CALCIUM 9.2 9.4  MG 2.1  --     Cardiac Enzymes Recent Labs  Lab 07/04/18 2237  TROPONINI 0.04*    Microbiology Results  Results for orders placed or performed during the hospital encounter of 06/26/18  MRSA PCR Screening     Status: None   Collection Time: 06/26/18  6:38 PM  Result Value Ref Range Status   MRSA by PCR NEGATIVE NEGATIVE Final    Comment:        The GeneXpert MRSA Assay (FDA approved for NASAL specimens only), is one component  of a comprehensive MRSA colonization surveillance program. It is not intended to diagnose MRSA infection nor to guide or monitor treatment for MRSA infections. Performed at [facility], [address], [place], [place] [address]   Urine Culture     Status: None   Collection Time: 07/04/18 10:33 AM  Result Value Ref Range Status   Specimen Description   Final    URINE, RANDOM Performed at [facility], [address] [place], [place] [address]    Special Requests   Final    NONE Performed at [facility], [address] [place], [place] [address]    Culture   Final    Multiple bacterial morphotypes present, none predominant. Suggest appropriate recollection if clinically indicated.   Report Status 07/06/2018 FINAL  Final    RADIOLOGY:  No results found.  EKG:   Orders placed or performed during the hospital encounter of 12/05/17  . EKG 12-Lead  . EKG 12-Lead  . EKG 12-Lead  . EKG 12-Lead      Management plans discussed with the patient, family and they are in agreement.  CODE STATUS:     Code Status Orders  (From admission, onward)         Start     Ordered   06/26/18 1758  Full code  Continuous     06/26/18 1757        Code Status History    Date Active Date Inactive Code Status Order ID Comments User Context   06/21/2018 1728 06/22/2018 1737 Full Code [id number]  Katha Cabal, MD Inpatient   02/08/2018 1016 02/13/2018 2200 Full Code [id number]  Katha Cabal, MD Inpatient   12/05/2017 1908 12/19/2017 1407 Full Code [id number]  Delana Meyer, Dolores Lory, MD Inpatient   11/27/2017  1217 11/27/2017 1717 Full Code [id number]  Katha Cabal, MD Inpatient   09/07/2017 0835 09/07/2017 1333 Full Code [id number]  Teodoro Spray, MD Inpatient   04/19/2017 1750 04/25/2017 1753 Full Code [id number]  Vaughan Basta, MD Inpatient   04/04/2016 1027 04/07/2016 1451 Full Code [id number]  Hessie Knows, MD Inpatient      TOTAL TIME TAKING  CARE OF THIS PATIENT: 40 minutes.    [name] Salary M.D on 07/11/2018 at 3:37 PM  Between 7am to 6pm - Pager - [phone]  After 6pm go to [email or web address] - password [email or web address] [facility]  Office  [phone]  CC: Primary care physician; [name], MD   Note: This dictation was prepared with Dragon dictation along with smaller phrase technology. Any transcriptional errors that result from this process are unintentional.

## 2018-07-09 NOTE — Progress Notes (Signed)
8 Days Post-Op   Subjective/Chief Complaint: Doing OK. Pain controlled with current regimen. Tolerating PO   Objective: Vital signs in last 24 hours: Temp:  [98 F (36.7 C)-98.8 F (37.1 C)] 98.2 F (36.8 C) (12/24 0804) Pulse Rate:  [94-99] 98 (12/24 0804) Resp:  [18-20] 18 (12/24 0804) BP: (95-117)/(60-69) 101/66 (12/24 0804) SpO2:  [93 %-97 %] 93 % (12/24 0804) Last BM Date: 07/07/18  Intake/Output from previous day: 12/23 0701 - 12/24 0700 In: 3 [I.V.:3] Out: -  Intake/Output this shift: Total I/O In: -  Out: 300 [Urine:300]  Alert, NAD Lungs: CTA CV:RR Extremities:Stump  blistered area of the posterior flap continued improvement; there is a small <1 cm area of epidermallysis that dry    Lab Results:  Recent Labs    07/07/18 0429  WBC 12.6*  HGB 9.9*  HCT 31.1*  PLT 342   BMET Recent Labs    07/07/18 0429  NA 134*  K 4.2  CL 96*  CO2 25  GLUCOSE 131*  BUN 26*  CREATININE 0.59  CALCIUM 9.2   PT/INR No results for input(s): LABPROT, INR in the last 72 hours. ABG No results for input(s): PHART, HCO3 in the last 72 hours.  Invalid input(s): PCO2, PO2  Studies/Results: No results found.  Anti-infectives: Anti-infectives (From admission, onward)   Start     Dose/Rate Route Frequency Ordered Stop   07/01/18 2300  ceFAZolin (ANCEF) IVPB 1 g/50 mL premix    Note to Pharmacy:  Send with pt to OR   1 g 100 mL/hr over 30 Minutes Intravenous Every 8 hours 07/01/18 1731 07/02/18 1536   07/01/18 0500  ceFAZolin (ANCEF) IVPB 2g/100 mL premix    Note to Pharmacy:  Send to OR with patient please.   2 g 200 mL/hr over 30 Minutes Intravenous On call 06/28/18 1458 07/01/18 1454   06/28/18 0500  ceFAZolin (ANCEF) IVPB 2g/100 mL premix    Note to Pharmacy:  Please send with patient to specials   2 g 200 mL/hr over 30 Minutes Intravenous  Once 06/27/18 1024 06/28/18 0508   06/26/18 2200  hydroxychloroquine (PLAQUENIL) tablet 200 mg     200 mg Oral 2  times daily 06/26/18 1752     06/26/18 1115  ceFAZolin (ANCEF) IVPB 2g/100 mL premix     2 g 200 mL/hr over 30 Minutes Intravenous  Once 06/26/18 1113 06/26/18 1526      Assessment/Plan: s/p Procedure(s): AMPUTATION BELOW KNEE (Right) OK to discharge today to [facility] Daily dressings.  LOS: 13 days    [name] A 07/09/2018

## 2018-07-09 NOTE — Progress Notes (Signed)
Occupational Therapy Treatment Patient Details Name: Melissa [name] MRN: [id number] DOB: [birth year] Today's Date: 07/09/2018    History of present illness Pt. 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 PMHx:  COPD, RA, foot ulcers, ataxia, a-fib, diverticulosis, PVD, MRSA, transfusions, excessive falls   OT comments  Pt seen for OT tx this date. Pt agreeable, reporting 5/10 R residual limb pain. Pt eager to trial donning undergarments to improve independence. Pt performed bed mobility with CGA-Min A and able to sit EOB without LOB. Pt/dtr educated in modified techniques for LB dressing leaning side to side to don/doff. Pt able to return demo learned technique with CGA and cues for technique. Pt/dtr educated in desensitization strategies for residual limb care and cognitive behavioral pain coping strategies to support improved pain mgt. Pt verbalized understanding. Pt requesting bath and linens change. NT notified. Clean linens and gown left in room for after bath. Pt progressing towards goals. Current OT POC and D/C remain appropriate. Will continue to progress.    Follow Up Recommendations  SNF    Equipment Recommendations  3 in 1 bedside commode(drop arm BSC)    Recommendations for Other Services      Precautions / Restrictions Precautions Precautions: Fall Precaution Comments: watch HgB Restrictions Weight Bearing Restrictions: Yes RLE Weight Bearing: Non weight bearing LLE Weight Bearing: Non weight bearing Other Position/Activity Restrictions: bilat BKA       Mobility Bed Mobility Overal bed mobility: Needs Assistance Bed Mobility: Supine to Sit;Sit to Supine     Supine to sit: Min assist Sit to supine: Min guard   General bed mobility comments: Min A for RLE mgt over side of bed, cues for technique   Transfers                      Balance Overall balance assessment: Needs  assistance Sitting-balance support: Feet unsupported;No upper extremity supported;Bilateral upper extremity supported;Single extremity supported Sitting balance-Leahy Scale: Fair Sitting balance - Comments: lateral leans during LB dressing tasks, CGA but no significant LOB                                   ADL either performed or assessed with clinical judgement   ADL Overall ADL's : Needs assistance/impaired                     Lower Body Dressing: Sitting/lateral leans;Min guard Lower Body Dressing Details (indicate cue type and reason): pt instructed in techniques, pt able to return demo technique with CGA while performing lateral leans to don/doff undergarments; +time/effort to perform                     Vision Patient Visual Report: No change from baseline     Perception     Praxis      Cognition Arousal/Alertness: Awake/alert Behavior During Therapy: WFL for tasks assessed/performed Overall Cognitive Status: Within Functional Limits for tasks assessed                                          Exercises Other Exercises Other Exercises: pt/dtr educated in cognitive behavioral pain coping strategies to support self mgt of pain Other Exercises: pt/dtr educated  in desensitization strategies for stump mgt   Shoulder Instructions       General Comments      Pertinent Vitals/ Pain       Pain Assessment: 0-10 Pain Score: 5  Pain Location: R stump Pain Intervention(s): Limited activity within patient's tolerance;Monitored during session;Repositioned;Premedicated before session;Utilized relaxation techniques  Home Living                                          Prior Functioning/Environment              Frequency  Min 1X/week        Progress Toward Goals  OT Goals(current goals can now be found in the care plan section)  Progress towards OT goals: Progressing toward goals  Acute Rehab OT  Goals Patient Stated Goal: To be able to do the things she was able to do before OT Goal Formulation: With patient Potential to Achieve Goals: Good  Plan Discharge plan remains appropriate;Frequency remains appropriate    Co-evaluation                 AM-PAC OT "6 Clicks" Daily Activity     Outcome Measure   Help from another person eating meals?: None Help from another person taking care of personal grooming?: None Help from another person toileting, which includes using toliet, bedpan, or urinal?: A Lot Help from another person bathing (including washing, rinsing, drying)?: A Lot Help from another person to put on and taking off regular upper body clothing?: None Help from another person to put on and taking off regular lower body clothing?: A Little 6 Click Score: 19    End of Session    OT Visit Diagnosis: Other abnormalities of gait and mobility (R26.89);Muscle weakness (generalized) (M62.81);Pain Pain - Right/Left: Right Pain - part of body: Leg   Activity Tolerance Patient tolerated treatment well   Patient Left in bed;with call bell/phone within reach;with bed alarm set   Nurse Communication Other (comment)(pt needs/requests bath and linen change, nurse tech notified)        Time: 5038-8828 OT Time Calculation (min): 28 min  Charges: OT General Charges $OT Visit: 1 Visit OT Treatments $Self Care/Home Management : 23-37 mins  Jeni Salles, MPH, MS, OTR/L ascom [phone] 07/09/18, 12:49 PM

## 2018-07-09 NOTE — Progress Notes (Signed)
Physical Therapy Treatment Patient Details Name: Melissa [name] MRN: [id number] DOB: 10/07/1940 Today's Date: 07/09/2018    History of Present Illness Pt. 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 PMHx:  COPD, RA, foot ulcers, ataxia, a-fib, diverticulosis, PVD, MRSA, transfusions, excessive falls    PT Comments    Initial attempt pt eating lunch; second attempt pt agreeable to PT. Pt declines out of bed, but agreeable to supine exercises. Pt demonstrates knowledge and performance of exercises; educated on positioning of RLE to maintain R knee extension. Pt notes that she needs to use the bed pan and calls nursing. No further PT interventions at this time. Continue to progress functional mobility.   Follow Up Recommendations  CIR     Equipment Recommendations  None recommended by PT    Recommendations for Other Services       Precautions / Restrictions Precautions Precautions: Fall Precaution Comments: watch HgB Restrictions Weight Bearing Restrictions: Yes RLE Weight Bearing: Non weight bearing LLE Weight Bearing: Non weight bearing Other Position/Activity Restrictions: bilat BKA    Mobility  Bed Mobility Overal bed mobility: Needs Assistance Bed Mobility: Supine to Sit;Sit to Supine     Supine to sit: Min assist Sit to supine: Min guard   General bed mobility comments: Min A for RLE mgt over side of bed, cues for technique   Transfers                    Ambulation/Gait                 Stairs             Wheelchair Mobility    Modified Rankin (Stroke Patients Only)       Balance Overall balance assessment: Needs assistance Sitting-balance support: Feet unsupported;No upper extremity supported;Bilateral upper extremity supported;Single extremity supported Sitting balance-Leahy Scale: Fair Sitting balance - Comments: lateral leans during LB dressing tasks, CGA but no  significant LOB                                    Cognition Arousal/Alertness: Awake/alert Behavior During Therapy: WFL for tasks assessed/performed Overall Cognitive Status: Within Functional Limits for tasks assessed                                 General Comments: Declines out of bed      Exercises General Exercises - Lower Extremity Quad Sets: Strengthening;Both;15 reps Short Arc Quad: AROM;Both;15 reps Hip ABduction/ADduction: AROM;Both;15 reps Straight Leg Raises: AROM;Both;15 reps Other Exercises Other Exercises: reviewed positioning for knee extension. Other Exercises: pt/dtr educated in cognitive behavioral pain coping strategies to support self mgt of pain Other Exercises: pt/dtr educated in desensitization strategies for stump mgt    General Comments        Pertinent Vitals/Pain Pain Assessment: No/denies pain Pain Score: 5  Pain Location: R stump Pain Intervention(s): Limited activity within patient's tolerance;Monitored during session;Repositioned;Premedicated before session;Utilized relaxation techniques    Home Living                      Prior Function            PT Goals (current goals can now be found in the care plan section) Acute  Rehab PT Goals Patient Stated Goal: To be able to do the things she was able to do before Progress towards PT goals: Progressing toward goals(slowly)    Frequency    Min 2X/week      PT Plan Current plan remains appropriate    Co-evaluation              AM-PAC PT "6 Clicks" Mobility   Outcome Measure  Help needed turning from your back to your side while in a flat bed without using bedrails?: A Little Help needed moving from lying on your back to sitting on the side of a flat bed without using bedrails?: Total Help needed moving to and from a bed to a chair (including a wheelchair)?: Total Help needed standing up from a chair using your arms (e.g., wheelchair or  bedside chair)?: Total Help needed to walk in hospital room?: Total Help needed climbing 3-5 steps with a railing? : Total 6 Click Score: 8    End of Session   Activity Tolerance: Patient tolerated treatment well Patient left: in bed;with call bell/phone within reach;with bed alarm set Nurse Communication: Other (comment)(pt called for bed pan) PT Visit Diagnosis: Muscle weakness (generalized) (M62.81);Other abnormalities of gait and mobility (R26.89);Pain Pain - Right/Left: Right Pain - part of body: Leg     Time: 1215-1228 PT Time Calculation (min) (ACUTE ONLY): 13 min  Charges:  $Therapeutic Exercise: 8-22 mins                      Larae Grooms, PTA 07/09/2018, 1:33 PM

## 2018-07-09 NOTE — Progress Notes (Signed)
Talked to Dr. Brett Albino about patient's right leg pain still 9/10 after oxycodone is given at 1910, order given for one time dose of Morphine IV. RN will continue to monitor.

## 2018-07-09 NOTE — Clinical Social Work Note (Signed)
CSW spoke with [facility], they are still awaiting insurance approval.  CSW updated patient and her family, CSW to continue to follow patient's progress throughout discharge planning.  Jones Broom. [place], MSW, [place]  07/09/2018 5:26 PM

## 2018-07-10 LAB — CBC
HEMATOCRIT: 34.2 % — AB (ref 36.0–46.0)
HEMOGLOBIN: 10.6 g/dL — AB (ref 12.0–15.0)
MCH: 30.5 pg (ref 26.0–34.0)
MCHC: 31 g/dL (ref 30.0–36.0)
MCV: 98.3 fL (ref 80.0–100.0)
Platelets: 409 10*3/uL — ABNORMAL HIGH (ref 150–400)
RBC: 3.48 MIL/uL — ABNORMAL LOW (ref 3.87–5.11)
RDW: 14.4 % (ref 11.5–15.5)
WBC: 17.1 10*3/uL — ABNORMAL HIGH (ref 4.0–10.5)
nRBC: 0 % (ref 0.0–0.2)

## 2018-07-10 LAB — BASIC METABOLIC PANEL
Anion gap: 12 (ref 5–15)
BUN: 30 mg/dL — AB (ref 8–23)
CO2: 23 mmol/L (ref 22–32)
Calcium: 9.4 mg/dL (ref 8.9–10.3)
Chloride: 98 mmol/L (ref 98–111)
Creatinine, Ser: 0.75 mg/dL (ref 0.44–1.00)
GFR calc Af Amer: >60 mL/min (ref >60)
GFR calc non Af Amer: >60 mL/min (ref >60)
Glucose, Bld: 143 mg/dL — ABNORMAL HIGH (ref 70–99)
POTASSIUM: 4.4 mmol/L (ref 3.5–5.1)
Sodium: 133 mmol/L — ABNORMAL LOW (ref 135–145)

## 2018-07-10 NOTE — Progress Notes (Signed)
[facility] [place] NAME: Melissa [name]    MR#:  [id number]  DATE OF BIRTH:  1941/05/29  SUBJECTIVE:  Patient without complaint, no events overnight  REVIEW OF SYSTEMS:  CONSTITUTIONAL: No fever, fatigue or weakness.  EYES: No blurred or double vision.  EARS, NOSE, AND THROAT: No tinnitus or ear pain.  RESPIRATORY: No cough, shortness of breath, wheezing or hemoptysis.  CARDIOVASCULAR: No chest pain, orthopnea, edema.  GASTROINTESTINAL: No nausea, vomiting, diarrhea or abdominal pain.  GENITOURINARY: No dysuria, hematuria.  ENDOCRINE: No polyuria, nocturia,  HEMATOLOGY: No anemia, easy bruising or bleeding SKIN: No rash or lesion. MUSCULOSKELETAL: No joint pain or arthritis.   NEUROLOGIC: No tingling, numbness, weakness.  PSYCHIATRY: No anxiety or depression.   ROS  DRUG ALLERGIES:   Allergies  Allergen Reactions  . Lodine [Etodolac] Hives and Itching  . Methotrexate Derivatives Other (See Comments)    Flu like symptoms  . Nitrofuran Derivatives Nausea And Vomiting and Other (See Comments)    Made sicker   . Levaquin [Levofloxacin] Itching, Rash and Other (See Comments)    Muscle pain  . Norvasc [Amlodipine] Other (See Comments)    Unknown   . Pentasa [Mesalamine Er] Other (See Comments)    Unknown   . Sulfa Antibiotics Itching and Rash  . Vioxx [Rofecoxib] Other (See Comments)    Unknown     VITALS:  Blood pressure 111/65, pulse (!) 102, temperature 98.5 F (36.9 C), temperature source Oral, resp. rate 18, height 5' 4"  (1.626 m), weight 49 kg, SpO2 93 %.  PHYSICAL EXAMINATION:  GENERAL:  77 y.o.-year-old patient lying in the bed with no acute distress.  EYES: Pupils equal, round, reactive to light and accommodation. No scleral icterus. Extraocular muscles intact.  HEENT: Head atraumatic, normocephalic. Oropharynx and nasopharynx clear.  NECK:  Supple, no jugular venous distention. No thyroid enlargement, no tenderness.   LUNGS: Normal breath sounds bilaterally, no wheezing, rales,rhonchi or crepitation. No use of accessory muscles of respiration.  CARDIOVASCULAR: S1, S2 normal. No murmurs, rubs, or gallops.  ABDOMEN: Soft, nontender, nondistended. Bowel sounds present. No organomegaly or mass.  EXTREMITIES: No pedal edema, cyanosis, or clubbing.  NEUROLOGIC: Cranial nerves II through XII are intact. Muscle strength 5/5 in all extremities. Sensation intact. Gait not checked.  PSYCHIATRIC: The patient is alert and oriented x 3.  SKIN: No obvious rash, lesion, or ulcer.   Physical Exam LABORATORY PANEL:   CBC Recent Labs  Lab 07/10/18 0845  WBC 17.1*  HGB 10.6*  HCT 34.2*  PLT 409*   ------------------------------------------------------------------------------------------------------------------  Chemistries  Recent Labs  Lab 07/07/18 0429 07/10/18 0845  NA 134* 133*  K 4.2 4.4  CL 96* 98  CO2 25 23  GLUCOSE 131* 143*  BUN 26* 30*  CREATININE 0.59 0.75  CALCIUM 9.2 9.4  MG 2.1  --    ------------------------------------------------------------------------------------------------------------------  Cardiac Enzymes Recent Labs  Lab 07/04/18 1614 07/04/18 2237  TROPONINI 0.03* 0.04*   ------------------------------------------------------------------------------------------------------------------  RADIOLOGY:  No results found.  ASSESSMENT AND PLAN:  77 year old female with history of PAD and PAF.  *chronic P afib Stable Continue diltiazem, aspirin and Eliquis  * Acute on chronic anemia Improved S/P packed red blood cell transfusion, hemoglobin 9.9   *PAD s/p b/l BKA By Vascular surgery  For skilled nursingfacilty  *Hyperlipidemia Stable continue Lopid/pravastatin  *Chronic moderate malnutrition Dietary consulted  continue feeding supplementation   *Chronic GAD Stable  Continue Ativan and Zoloft  *Fall with wrist  pain  Noted x-ray findings for  slight widening of the scapholunate distance could reflect scapholunate dissociation Seen by orthopedic surgery-no intervention recommended, patient using her wrist without difficulty/pain No further work-up indicated for x-ray findings  Skilled nursing facility when bed is available  All the records are reviewed and case discussed with Care Management/Social Workerr. Management plans discussed with the patient, family and they are in agreement.  CODE STATUS: full  TOTAL TIME TAKING CARE OF THIS PATIENT: 35 minutes.     POSSIBLE D/C IN 1-3 DAYS, DEPENDING ON CLINICAL CONDITION.   [name] Patrich Heinze M.D on 07/10/2018   Between 7am to 6pm - Pager - [phone]  After 6pm go to [email or web address] - password [email or web address] [facility]  Office  [phone]  CC: Primary care physician; [name], MD  Note: This dictation was prepared with Dragon dictation along with smaller phrase technology. Any transcriptional errors that result from this process are unintentional.

## 2018-07-10 NOTE — Plan of Care (Signed)

## 2018-07-10 NOTE — Progress Notes (Signed)
9 Days Post-Op   Subjective/Chief Complaint: Doing OK. States pain in the right stump is improved. Controlled with current regimen.   Objective: Vital signs in last 24 hours: Temp:  [98.2 F (36.8 C)-98.8 F (37.1 C)] 98.5 F (36.9 C) (12/25 0802) Pulse Rate:  [92-105] 101 (12/25 0802) Resp:  [18-22] 22 (12/25 0802) BP: (102-112)/(61-64) 102/61 (12/25 0802) SpO2:  [94 %-96 %] 94 % (12/25 0802) Weight:  [49 kg] 49 kg (12/25 0348) Last BM Date: 07/09/18  Intake/Output from previous day: 12/24 0701 - 12/25 0700 In: 0  Out: 900 [Urine:900] Intake/Output this shift: No intake/output data recorded.  General appearance: alert and no distress Resp: clear to auscultation bilaterally Cardio: regular rate and rhythm Extremities: Blistered area improved, posterior flap warm.  Lab Results:  No results for input(s): WBC, HGB, HCT, PLT in the last 72 hours. BMET No results for input(s): NA, K, CL, CO2, GLUCOSE, BUN, CREATININE, CALCIUM in the last 72 hours. PT/INR No results for input(s): LABPROT, INR in the last 72 hours. ABG No results for input(s): PHART, HCO3 in the last 72 hours.  Invalid input(s): PCO2, PO2  Studies/Results: No results found.  Anti-infectives: Anti-infectives (From admission, onward)   Start     Dose/Rate Route Frequency Ordered Stop   07/01/18 2300  ceFAZolin (ANCEF) IVPB 1 g/50 mL premix    Note to Pharmacy:  Send with pt to OR   1 g 100 mL/hr over 30 Minutes Intravenous Every 8 hours 07/01/18 1731 07/02/18 1536   07/01/18 0500  ceFAZolin (ANCEF) IVPB 2g/100 mL premix    Note to Pharmacy:  Send to OR with patient please.   2 g 200 mL/hr over 30 Minutes Intravenous On call 06/28/18 1458 07/01/18 1454   06/28/18 0500  ceFAZolin (ANCEF) IVPB 2g/100 mL premix    Note to Pharmacy:  Please send with patient to specials   2 g 200 mL/hr over 30 Minutes Intravenous  Once 06/27/18 1024 06/28/18 0508   06/26/18 2200  hydroxychloroquine (PLAQUENIL) tablet 200  mg     200 mg Oral 2 times daily 06/26/18 1752     06/26/18 1115  ceFAZolin (ANCEF) IVPB 2g/100 mL premix     2 g 200 mL/hr over 30 Minutes Intravenous  Once 06/26/18 1113 06/26/18 1526      Assessment/Plan: s/p Procedure(s): AMPUTATION BELOW KNEE (Right) Stump cleaned and betadine painted over blister. dry dressing placed.   Continue daily dressing changes. OK to discharge when bed becomes available; medically stable.  LOS: 14 days    Melissa [name] A 07/10/2018

## 2018-07-10 NOTE — Progress Notes (Signed)
   07/10/18 1600  Clinical Encounter Type  Visited With Patient  Visit Type Initial  Referral From Patient  Recommendations Follow-up as needed.  Spiritual Encounters  Spiritual Needs Emotional  Stress Factors  Patient Stress Factors Other (Comment) (Physical care.)   While discussing the case of the patient in #242, Chaplain was called by Ms. Sliney. Upon entering the room, the patient was crying about the absence of her husband, the need to go to the bathroom, and inability to reach her water. The latter was not the case. Chaplain notified nursing of the patient's needs and assured the patient that assistance was forthcoming. The patient became less emotive.

## 2018-07-11 DIAGNOSIS — I2510 Atherosclerotic heart disease of native coronary artery without angina pectoris: Secondary | ICD-10-CM | POA: Diagnosis not present

## 2018-07-11 DIAGNOSIS — M069 Rheumatoid arthritis, unspecified: Secondary | ICD-10-CM | POA: Diagnosis not present

## 2018-07-11 DIAGNOSIS — I10 Essential (primary) hypertension: Secondary | ICD-10-CM | POA: Diagnosis not present

## 2018-07-11 DIAGNOSIS — E785 Hyperlipidemia, unspecified: Secondary | ICD-10-CM | POA: Diagnosis not present

## 2018-07-11 DIAGNOSIS — F418 Other specified anxiety disorders: Secondary | ICD-10-CM | POA: Diagnosis not present

## 2018-07-11 DIAGNOSIS — D638 Anemia in other chronic diseases classified elsewhere: Secondary | ICD-10-CM | POA: Diagnosis not present

## 2018-07-11 DIAGNOSIS — I480 Paroxysmal atrial fibrillation: Secondary | ICD-10-CM | POA: Diagnosis not present

## 2018-07-11 DIAGNOSIS — M6281 Muscle weakness (generalized): Secondary | ICD-10-CM | POA: Diagnosis not present

## 2018-07-11 DIAGNOSIS — I739 Peripheral vascular disease, unspecified: Secondary | ICD-10-CM | POA: Diagnosis not present

## 2018-07-11 DIAGNOSIS — K5190 Ulcerative colitis, unspecified, without complications: Secondary | ICD-10-CM | POA: Diagnosis not present

## 2018-07-11 DIAGNOSIS — K219 Gastro-esophageal reflux disease without esophagitis: Secondary | ICD-10-CM | POA: Diagnosis not present

## 2018-07-11 DIAGNOSIS — I4891 Unspecified atrial fibrillation: Secondary | ICD-10-CM | POA: Diagnosis not present

## 2018-07-11 DIAGNOSIS — J449 Chronic obstructive pulmonary disease, unspecified: Secondary | ICD-10-CM | POA: Diagnosis not present

## 2018-07-11 DIAGNOSIS — Z89512 Acquired absence of left leg below knee: Secondary | ICD-10-CM | POA: Diagnosis not present

## 2018-07-11 DIAGNOSIS — D508 Other iron deficiency anemias: Secondary | ICD-10-CM | POA: Diagnosis not present

## 2018-07-11 DIAGNOSIS — F3289 Other specified depressive episodes: Secondary | ICD-10-CM | POA: Diagnosis not present

## 2018-07-11 DIAGNOSIS — I959 Hypotension, unspecified: Secondary | ICD-10-CM | POA: Diagnosis not present

## 2018-07-11 DIAGNOSIS — Z89511 Acquired absence of right leg below knee: Secondary | ICD-10-CM | POA: Diagnosis not present

## 2018-07-11 DIAGNOSIS — E7849 Other hyperlipidemia: Secondary | ICD-10-CM | POA: Diagnosis not present

## 2018-07-11 DIAGNOSIS — I998 Other disorder of circulatory system: Secondary | ICD-10-CM | POA: Diagnosis not present

## 2018-07-11 DIAGNOSIS — M2550 Pain in unspecified joint: Secondary | ICD-10-CM | POA: Diagnosis not present

## 2018-07-11 DIAGNOSIS — Z7401 Bed confinement status: Secondary | ICD-10-CM | POA: Diagnosis not present

## 2018-07-11 MED ORDER — SODIUM CHLORIDE 0.9 % IV BOLUS
500.0000 mL | Freq: Once | INTRAVENOUS | Status: AC
  Administered 2018-07-11: 500 mL via INTRAVENOUS

## 2018-07-11 MED ORDER — GABAPENTIN 300 MG PO CAPS: 300.0000 mg | ORAL_CAPSULE | Freq: Every day | ORAL | 0 refills | Status: AC

## 2018-07-11 MED ORDER — MEGESTROL ACETATE 400 MG/10ML PO SUSP
400.0000 mg | Freq: Two times a day (BID) | ORAL | Status: DC
  Administered 2018-07-11: 400 mg via ORAL
  Filled 2018-07-11 (×2): qty 10

## 2018-07-11 NOTE — Progress Notes (Signed)
Physical Therapy Treatment Patient Details Name: Melissa [name] MRN: [id number] DOB: 27-Nov-1940 Today's Date: 07/11/2018    History of Present Illness Pt. 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 PMHx:  COPD, RA, foot ulcers, ataxia, a-fib, diverticulosis, PVD, MRSA, transfusions, excessive falls    PT Comments    Pt agreeable to PT; reports 8/10 pain in R residual limb and pt distressed upon entering. Pt participates in mobility to edge of bed, LE and UE/upper trunk exercises. Use of focus on LLE with RLE exercise with improved pain levels to minimum without visible distress. Pt demonstrates movement well in BLE and improving understanding of upper body strengthening. Fatigued post exercises performed. Pt give initial written HEP.  Continue PT to progress strength, endurance and HEP to allow for improved functional mobility.   Follow Up Recommendations        Equipment Recommendations       Recommendations for Other Services       Precautions / Restrictions Precautions Precautions: Fall Precaution Comments: watch HgB Restrictions Weight Bearing Restrictions: Yes RLE Weight Bearing: Non weight bearing LLE Weight Bearing: Non weight bearing Other Position/Activity Restrictions: bilat BKA    Mobility  Bed Mobility Overal bed mobility: Needs Assistance Bed Mobility: Supine to Sit;Sit to Supine     Supine to sit: Min assist Sit to supine: Supervision;Modified independent (Device/Increase time)   General bed mobility comments: Does require some cues for hand placement to mobilize forward and back. Assist with chuck to mobilize forward to edge of bed  Transfers                    Ambulation/Gait                 Stairs             Wheelchair Mobility    Modified Rankin (Stroke Patients Only)       Balance Overall balance assessment: Needs assistance Sitting-balance support:  Feet unsupported;No upper extremity supported;Bilateral upper extremity supported;Single extremity supported Sitting balance-Leahy Scale: Fair Sitting balance - Comments: Able to sit upright in bed and manipulate long handle sponge/reacher on this date. Supervision provided, no significant LOB.                                    Cognition Arousal/Alertness: Awake/alert Behavior During Therapy: WFL for tasks assessed/performed Overall Cognitive Status: Within Functional Limits for tasks assessed                                 General Comments: Declines out of bed      Exercises General Exercises - Upper Extremity Chair Push Up: Strengthening;Both;10 reps;Other (comment)(2 sets; long sit in bed) General Exercises - Lower Extremity Quad Sets: Strengthening;Both;20 reps Long Arc Quad: AROM;Both;20 reps;Seated Hip ABduction/ADduction: AROM;Strengthening;Both;20 reps;Supine Hip Flexion/Marching: AROM;Both;20 reps;Seated Other Exercises Other Exercises: pillow squeezes 20x 5 sec hold Other Exercises: Palm/chest press 20x 5 sec hold; scapular squeeze 20x 5 sec hold    General Comments        Pertinent Vitals/Pain Pain Assessment: 0-10 Pain Score: 8  Pain Location: R residual limb Pain Descriptors / Indicators: Constant;Aching(Phantom) Pain Intervention(s): Monitored during session;Repositioned;Other (comment)(focus on LLE)    Home Living  Prior Function            PT Goals (current goals can now be found in the care plan section) Acute Rehab PT Goals Patient Stated Goal: To be able to do the things she was able to do before Progress towards PT goals: Progressing toward goals    Frequency    Min 2X/week      PT Plan Current plan remains appropriate    Co-evaluation              AM-PAC PT "6 Clicks" Mobility   Outcome Measure  Help needed turning from your back to your side while in a flat bed  without using bedrails?: A Little Help needed moving from lying on your back to sitting on the side of a flat bed without using bedrails?: A Little Help needed moving to and from a bed to a chair (including a wheelchair)?: Total Help needed standing up from a chair using your arms (e.g., wheelchair or bedside chair)?: Total Help needed to walk in hospital room?: Total Help needed climbing 3-5 steps with a railing? : Total 6 Click Score: 10    End of Session   Activity Tolerance: Patient tolerated treatment well;Patient limited by fatigue Patient left: in bed;with call bell/phone within reach;with bed alarm set;with family/visitor present   PT Visit Diagnosis: Muscle weakness (generalized) (M62.81);Other abnormalities of gait and mobility (R26.89);Pain Pain - Right/Left: Right Pain - part of body: Leg     Time: 4734-0370 PT Time Calculation (min) (ACUTE ONLY): 28 min  Charges:  $Therapeutic Exercise: 8-22 mins $Therapeutic Activity: 8-22 mins                     Larae Grooms, PTA 07/11/2018, 1:49 PM

## 2018-07-11 NOTE — Progress Notes (Signed)
Patient being transported via EMS to [facility]. IV taken out and tele monitor off.

## 2018-07-11 NOTE — Progress Notes (Signed)
Nutrition Follow Up Note   DOCUMENTATION CODES:   Severe malnutrition in context of chronic illness  INTERVENTION:   Ensure Enlive po BID, each supplement provides 350 kcal and 20 grams of protein  Add Vital Cuisine BID, each supplement provides 520kcal and 22g of protein.   MVI daily  Vitamin C 268m po BID  Magic cup TID with meals, each supplement provides 290 kcal and 9 grams of protein  Snacks   Pt at high refeeding risk; recommend monitor K, Mg and P labs when oral intake improves   Consider appetite stimulant to help increase pt's oral intake  NUTRITION DIAGNOSIS:   Severe Malnutrition related to chronic illness(COPD ) as evidenced by severe fat depletion, severe muscle depletion.  GOAL:   Patient will meet greater than or equal to 90% of their needs  -not met   MONITOR:   PO intake, Supplement acceptance, Labs, Weight trends, Skin, I & O's  ASSESSMENT:   77y.o. female with a known history of L BKA, severe peripheral vascular/peripheral arterial disease admitted for right lower extremity gangrene now s/p R BKA 12/16   Pt continues to have poor appetite and oral intake; eating <25% of meals. Pt is refusing most of the Ensure supplements but is eating some of the Magic Cups. Snacks added between meals. Pt on liberal diet. RD will add vital cuisine milkshakes to lunch and dinner trays. Consider appetite stimulant to help increase pt's oral intake. Pt at high refeeding risk; recommend monitor K, Mg and P labs when oral intake improves  Per chart, pt down ~3lbs since her BKA; RD will continue to monitor.   Medications reviewed and include: aspirin, oscal w/ D, colace, ferrous sulfate, MVI, protonix, prednisone, vitamin C  Labs reviewed: Na 133(L), BUN 30(H)- 12/25 Mg 2.1 wnl- 12/22 Wbc- 17.1(H), Hgb 10.6(L), Hct 34.2(L)- 12/25  Diet Order:   Diet Order            Diet - low sodium heart healthy        Diet regular Room service appropriate? Yes; Fluid  consistency: Thin  Diet effective now             EDUCATION NEEDS:   Education needs have been addressed  Skin:  Skin Assessment: Reviewed RN Assessment(incision; closed rt leg; ecchymosis)  Last BM:  12/25- type 6  Height:   Ht Readings from Last 1 Encounters:  06/27/18 5' 4"  (1.626 m)    Weight:   Wt Readings from Last 1 Encounters:  07/10/18 49 kg    Ideal Body Weight:  (S) 48 kg(adjusted for BL BKA)  BMI:  Body mass index is 18.54 kg/m.  Estimated Nutritional Needs:   Kcal:  1200-1400kcal/day   Protein:  76-86g/day   Fluid:  1.2L/day   CKoleen DistanceMS, RD, LDN Pager #- [phone]#- [phone] Hours Pager: [phone]

## 2018-07-11 NOTE — Progress Notes (Signed)
Occupational Therapy Treatment Patient Details Name: Melissa [name] MRN: [id number] DOB: [birth year] Today's Date: 07/11/2018    History of present illness Pt. 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 PMHx:  COPD, RA, foot ulcers, ataxia, a-fib, diverticulosis, PVD, MRSA, transfusions, excessive falls   OT comments  Pt seen for OT tx on this date. Pt agreeable, reporting 4-5/10 R residual limb pain. Pt eager to trial use of reacher and long handle sponge to improve independence. Pt performed supine to sit and sit to supine bed mobility however declined to come EOB to trail AE. Provided supervision during bed mobility with VC for safety and technique. Pt progressing towards goals. Current OT POC and D/C remain appropriate. Will continue to monitor progress.  Follow Up Recommendations  SNF    Equipment Recommendations  3 in 1 bedside commode(Drop arm BSC)    Recommendations for Other Services      Precautions / Restrictions Precautions Precautions: Fall Precaution Comments: watch HgB Restrictions Weight Bearing Restrictions: Yes RLE Weight Bearing: Non weight bearing LLE Weight Bearing: Non weight bearing Other Position/Activity Restrictions: bilat BKA       Mobility Bed Mobility Overal bed mobility: Needs Assistance Bed Mobility: Supine to Sit;Sit to Supine     Supine to sit: Supervision Sit to supine: Supervision   General bed mobility comments: Supervision for supine to sit in bed. Pt declined coming EOB to work with AE.   Transfers                      Balance Overall balance assessment: Needs assistance Sitting-balance support: Feet unsupported;No upper extremity supported;Bilateral upper extremity supported;Single extremity supported Sitting balance-Leahy Scale: Fair Sitting balance - Comments: Able to sit upright in bed and manipulate long handle sponge/reacher on this date. Supervision  provided, no significant LOB.                                   ADL either performed or assessed with clinical judgement   ADL Overall ADL's : Needs assistance/impaired             Lower Body Bathing: Set up;Moderate assistance Lower Body Bathing Details (indicate cue type and reason): Pt instructed on use of long handle sponge for LB bathing. Pt returned demonstrated use of sponge to reach legs and back with supervision and verbal cues for safety while sitting up in bed.                              Vision Patient Visual Report: No change from baseline     Perception     Praxis      Cognition Arousal/Alertness: Awake/alert Behavior During Therapy: WFL for tasks assessed/performed Overall Cognitive Status: Within Functional Limits for tasks assessed                                 General Comments: Declines out of bed        Exercises Other Exercises Other Exercises: Pt/dtr educated in use of long handle sponge and reacher to prmote safety and balance with LB bathing and dressing. Other Exercises: pt/dtr educated in cognitive behavioral pain coping strategies to support self mgt of pain   Shoulder  Instructions       General Comments      Pertinent Vitals/ Pain       Pain Score: 5  Pain Location: R stump Pain Descriptors / Indicators: Operative site guarding;Aching;Shooting;Pins and needles Pain Intervention(s): Limited activity within patient's tolerance;Monitored during session;Premedicated before session  Home Living                                          Prior Functioning/Environment              Frequency  Min 1X/week        Progress Toward Goals  OT Goals(current goals can now be found in the care plan section)  Progress towards OT goals: Progressing toward goals  Acute Rehab OT Goals Patient Stated Goal: To be able to do the things she was able to do before OT Goal Formulation:  With patient Potential to Achieve Goals: Good  Plan Discharge plan remains appropriate;Frequency remains appropriate    Co-evaluation                 AM-PAC OT "6 Clicks" Daily Activity     Outcome Measure   Help from another person eating meals?: None Help from another person taking care of personal grooming?: None Help from another person toileting, which includes using toliet, bedpan, or urinal?: A Lot Help from another person bathing (including washing, rinsing, drying)?: A Lot Help from another person to put on and taking off regular upper body clothing?: None Help from another person to put on and taking off regular lower body clothing?: A Little 6 Click Score: 19    End of Session    OT Visit Diagnosis: Other abnormalities of gait and mobility (R26.89);Muscle weakness (generalized) (M62.81);Pain Pain - Right/Left: Right Pain - part of body: Leg   Activity Tolerance Patient tolerated treatment well   Patient Left in bed;with call bell/phone within reach;with bed alarm set   Nurse Communication          Time: 6387-5643 OT Time Calculation (min): 19 min  Charges: OT General Charges $OT Visit: 1 Visit OT Treatments $Self Care/Home Management : 8-22 mins  Roshunda Keir, OTR/L 07/11/18, 12:33 PM

## 2018-07-11 NOTE — Clinical Social Work Placement (Signed)
   CLINICAL SOCIAL WORK PLACEMENT  NOTE  Date:  07/11/2018  Patient Details  Name: Melissa [name] MRN: [id number] Date of Birth: 07-26-1940  Clinical Social Work is seeking post-discharge placement for this patient at the [place] level of care (*CSW will initial, date and re-position this form in  chart as items are completed):  Yes   Patient/family provided with [facility]'s list of facilities offering this level of care within the geographic area requested by the patient (or if unable, by the patient's family).  Yes   Patient/family informed of their freedom to choose among providers that offer the needed level of care, that participate in Medicare, Medicaid or managed care program needed by the patient, have an available bed and are willing to accept the patient.  Yes   Patient/family informed of 's ownership interest in [facility] and [facility], as well as of the fact that they are under no obligation to receive care at these facilities.  PASRR submitted to EDS on 07/06/18     PASRR number received on 07/06/18     Existing PASRR number confirmed on       FL2 transmitted to all facilities in geographic area requested by pt/family on 07/06/18     FL2 transmitted to all facilities within larger geographic area on       Patient informed that his/her managed care company has contracts with or will negotiate with certain facilities, including the following:        Yes   Patient/family informed of bed offers received.  Patient [facility] bed [facility]     Physician recommends and patient chooses bed at      Patient to be transferred to [facility] on 07/11/18.  Patient to be transferred to facility by [facility]     Patient family notified on 07/11/18 of transfer.  Name of family member notified:  Richardean Chimera patient's daughter     PHYSICIAN Please sign FL2, Please  prepare prescriptions     Additional Comment:    _______________________________________________ Ross Ludwig, LCSWA 07/11/2018, 4:16 PM

## 2018-07-11 NOTE — Plan of Care (Signed)
  Problem: Education: Goal: Knowledge of General Education information will improve Description Including pain rating scale, medication(s)/side effects and non-pharmacologic comfort measures Outcome: Progressing   Problem: Health Behavior/Discharge Planning: Goal: Ability to manage health-related needs will improve Outcome: Progressing   Problem: Coping: Goal: Level of anxiety will decrease Outcome: Progressing   Problem: Pain Managment: Goal: General experience of comfort will improve Outcome: Progressing   Problem: Pain Management: Goal: Pain level will decrease with appropriate interventions Outcome: Progressing

## 2018-07-11 NOTE — Clinical Social Work Note (Signed)
CSW received phone call that [facility] has received insurance authorization.  CSW updated patient's family and physician.    Patient to be d/c'ed today to [facility] room 504.  Patient and family agreeable to plans will transport via ems RN to call report [phone].  Patient's daughter is at bedside and is aware that patient will be discharging today.  Evette Cristal, MSW, [place]

## 2018-07-11 NOTE — Progress Notes (Signed)
10 Days Post-Op   Subjective/Chief Complaint: Notes pain in Right stump. Unchanged. Mostly at night.   Objective: Vital signs in last 24 hours: Temp:  [97.8 F (36.6 C)-99.6 F (37.6 C)] 99.6 F (37.6 C) (12/26 0859) Pulse Rate:  [102-105] 104 (12/26 0859) Resp:  [18] 18 (12/25 1627) BP: (83-115)/(57-66) 83/57 (12/26 0859) SpO2:  [93 %-100 %] 99 % (12/26 0859) Last BM Date: 07/10/18  Intake/Output from previous day: 12/25 0701 - 12/26 0700 In: 237 [P.O.:237] Out: 302 [Urine:301; Stool:1] Intake/Output this shift: No intake/output data recorded.  General appearance: alert and no distress Cardio: regular rate and rhythm Extremities: RIGHT stump: serous drainage, blister resolved, +ecchymosis, tender to touch, incision C/D/I, lateral margin with eschar.  Lab Results:  Recent Labs    07/10/18 0845  WBC 17.1*  HGB 10.6*  HCT 34.2*  PLT 409*   BMET Recent Labs    07/10/18 0845  NA 133*  K 4.4  CL 98  CO2 23  GLUCOSE 143*  BUN 30*  CREATININE 0.75  CALCIUM 9.4   PT/INR No results for input(s): LABPROT, INR in the last 72 hours. ABG No results for input(s): PHART, HCO3 in the last 72 hours.  Invalid input(s): PCO2, PO2  Studies/Results: No results found.  Anti-infectives: Anti-infectives (From admission, onward)   Start     Dose/Rate Route Frequency Ordered Stop   07/01/18 2300  ceFAZolin (ANCEF) IVPB 1 g/50 mL premix    Note to Pharmacy:  Send with pt to OR   1 g 100 mL/hr over 30 Minutes Intravenous Every 8 hours 07/01/18 1731 07/02/18 1536   07/01/18 0500  ceFAZolin (ANCEF) IVPB 2g/100 mL premix    Note to Pharmacy:  Send to OR with patient please.   2 g 200 mL/hr over 30 Minutes Intravenous On call 06/28/18 1458 07/01/18 1454   06/28/18 0500  ceFAZolin (ANCEF) IVPB 2g/100 mL premix    Note to Pharmacy:  Please send with patient to specials   2 g 200 mL/hr over 30 Minutes Intravenous  Once 06/27/18 1024 06/28/18 0508   06/26/18 2200   hydroxychloroquine (PLAQUENIL) tablet 200 mg     200 mg Oral 2 times daily 06/26/18 1752     06/26/18 1115  ceFAZolin (ANCEF) IVPB 2g/100 mL premix     2 g 200 mL/hr over 30 Minutes Intravenous  Once 06/26/18 1113 06/26/18 1526      Assessment/Plan: s/p Procedure(s): AMPUTATION BELOW KNEE (Right) Continue dressing changes daily.  Monitor stump and pain control.   LOS: 15 days    Evaristo Bury 07/11/2018

## 2018-07-11 NOTE — Progress Notes (Signed)
[place] [facility] [place] NAME: Melissa [name]    MR#:  [id number]  DATE OF BIRTH:  [birth year]  SUBJECTIVE:  Patient without complaint, no events overnight, case discussed with vascular surgery-okay for discharge to skilled nursing facility when bed is available  REVIEW OF SYSTEMS:  CONSTITUTIONAL: No fever, fatigue or weakness.  EYES: No blurred or double vision.  EARS, NOSE, AND THROAT: No tinnitus or ear pain.  RESPIRATORY: No cough, shortness of breath, wheezing or hemoptysis.  CARDIOVASCULAR: No chest pain, orthopnea, edema.  GASTROINTESTINAL: No nausea, vomiting, diarrhea or abdominal pain.  GENITOURINARY: No dysuria, hematuria.  ENDOCRINE: No polyuria, nocturia,  HEMATOLOGY: No anemia, easy bruising or bleeding SKIN: No rash or lesion. MUSCULOSKELETAL: No joint pain or arthritis.   NEUROLOGIC: No tingling, numbness, weakness.  PSYCHIATRY: No anxiety or depression.   ROS  DRUG ALLERGIES:   Allergies  Allergen Reactions  . Lodine [Etodolac] Hives and Itching  . Methotrexate Derivatives Other (See Comments)    Flu like symptoms  . Nitrofuran Derivatives Nausea And Vomiting and Other (See Comments)    Made sicker   . Levaquin [Levofloxacin] Itching, Rash and Other (See Comments)    Muscle pain  . Norvasc [Amlodipine] Other (See Comments)    Unknown   . Pentasa [Mesalamine Er] Other (See Comments)    Unknown   . Sulfa Antibiotics Itching and Rash  . Vioxx [Rofecoxib] Other (See Comments)    Unknown     VITALS:  Blood pressure 124/75, pulse (!) 102, temperature 99.6 F (37.6 C), temperature source Oral, resp. rate 18, height 5' 4"  (1.626 m), weight 49 kg, SpO2 99 %.  PHYSICAL EXAMINATION:  GENERAL:  77 y.o.-year-old patient lying in the bed with no acute distress.  EYES: Pupils equal, round, reactive to light and accommodation. No scleral icterus. Extraocular muscles intact.  HEENT: Head atraumatic, normocephalic. Oropharynx and  nasopharynx clear.  NECK:  Supple, no jugular venous distention. No thyroid enlargement, no tenderness.  LUNGS: Normal breath sounds bilaterally, no wheezing, rales,rhonchi or crepitation. No use of accessory muscles of respiration.  CARDIOVASCULAR: S1, S2 normal. No murmurs, rubs, or gallops.  ABDOMEN: Soft, nontender, nondistended. Bowel sounds present. No organomegaly or mass.  EXTREMITIES: No pedal edema, cyanosis, or clubbing.  NEUROLOGIC: Cranial nerves II through XII are intact. Muscle strength 5/5 in all extremities. Sensation intact. Gait not checked.  PSYCHIATRIC: The patient is alert and oriented x 3.  SKIN: No obvious rash, lesion, or ulcer.   Physical Exam LABORATORY PANEL:   CBC Recent Labs  Lab 07/10/18 0845  WBC 17.1*  HGB 10.6*  HCT 34.2*  PLT 409*   ------------------------------------------------------------------------------------------------------------------  Chemistries  Recent Labs  Lab 07/07/18 0429 07/10/18 0845  NA 134* 133*  K 4.2 4.4  CL 96* 98  CO2 25 23  GLUCOSE 131* 143*  BUN 26* 30*  CREATININE 0.59 0.75  CALCIUM 9.2 9.4  MG 2.1  --    ------------------------------------------------------------------------------------------------------------------  Cardiac Enzymes Recent Labs  Lab 07/04/18 1614 07/04/18 2237  TROPONINI 0.03* 0.04*   ------------------------------------------------------------------------------------------------------------------  RADIOLOGY:  No results found.  ASSESSMENT AND PLAN:  77 year old female with history of PAD and PAF.  *chronic P afib Stable Continue diltiazem, aspirin and Eliquis  * Acute on chronic anemia Improved S/P packed red blood cell transfusion, hemoglobin 9.9   *PAD s/p b/l BKA By Vascular surgery  For skilled nursingfacilty  *Hyperlipidemia Stable continue Lopid/pravastatin  *Chronic moderate malnutrition Dietary consulted  continue feeding  supplementation    *Chronic GAD Stable  Continue Ativan and Zoloft  *Fall with wrist pain  Noted x-ray findings for slight widening of the scapholunate distance could reflect scapholunate dissociation Seen by orthopedic surgery-no intervention recommended, patient using her wrist without difficulty/pain No further work-up indicated for x-ray findings  Skilled nursing facility when bed is available  All the records are reviewed and case discussed with Care Management/Social Workerr. Management plans discussed with the patient, family and they are in agreement.  CODE STATUS: full  TOTAL TIME TAKING CARE OF THIS PATIENT: 35 minutes.     POSSIBLE D/C IN 1-3 DAYS, DEPENDING ON CLINICAL CONDITION.   [name] Farhiya Rosten M.D on 07/11/2018   Between 7am to 6pm - Pager - [phone]  After 6pm go to [email or web address] - password [email or web address] [place] [facility]  Office  [phone]  CC: Primary care physician; [name], MD  Note: This dictation was prepared with Dragon dictation along with smaller phrase technology. Any transcriptional errors that result from this process are unintentional.

## 2018-07-11 NOTE — Care Management Important Message (Signed)
Important Message  Patient Details  Name: Melissa [name] MRN: [id number] Date of Birth: [birth year]   Medicare Important Message Given:  Yes    Juliann Pulse A Curly Mackowski 07/11/2018, 12:58 PM

## 2018-07-11 NOTE — Plan of Care (Signed)
  Problem: Education: Goal: Knowledge of General Education information will improve Description Including pain rating scale, medication(s)/side effects and non-pharmacologic comfort measures Outcome: Adequate for Discharge   Problem: Health Behavior/Discharge Planning: Goal: Ability to manage health-related needs will improve Outcome: Adequate for Discharge   Problem: Clinical Measurements: Goal: Ability to maintain clinical measurements within normal limits will improve Outcome: Adequate for Discharge Goal: Will remain free from infection Outcome: Adequate for Discharge Goal: Diagnostic test results will improve Outcome: Adequate for Discharge Goal: Respiratory complications will improve Outcome: Adequate for Discharge Goal: Cardiovascular complication will be avoided Outcome: Adequate for Discharge   Problem: Activity: Goal: Risk for activity intolerance will decrease Outcome: Adequate for Discharge   Problem: Nutrition: Goal: Adequate nutrition will be maintained Outcome: Adequate for Discharge   Problem: Coping: Goal: Level of anxiety will decrease Outcome: Adequate for Discharge   Problem: Elimination: Goal: Will not experience complications related to bowel motility Outcome: Adequate for Discharge Goal: Will not experience complications related to urinary retention Outcome: Adequate for Discharge   Problem: Pain Managment: Goal: General experience of comfort will improve Outcome: Adequate for Discharge   Problem: Safety: Goal: Ability to remain free from injury will improve Outcome: Adequate for Discharge   Problem: Skin Integrity: Goal: Risk for impaired skin integrity will decrease Outcome: Adequate for Discharge   Problem: Education: Goal: Knowledge of the prescribed therapeutic regimen will improve Outcome: Adequate for Discharge Goal: Ability to verbalize activity precautions or restrictions will improve Outcome: Adequate for Discharge Goal:  Understanding of discharge needs will improve Outcome: Adequate for Discharge   Problem: Activity: Goal: Ability to perform//tolerate increased activity and mobilize with assistive devices will improve Outcome: Adequate for Discharge   Problem: Clinical Measurements: Goal: Postoperative complications will be avoided or minimized Outcome: Adequate for Discharge   Problem: Self-Care: Goal: Ability to meet self-care needs will improve Outcome: Adequate for Discharge   Problem: Self-Concept: Goal: Ability to maintain and perform role responsibilities to the fullest extent possible will improve Outcome: Adequate for Discharge   Problem: Pain Management: Goal: Pain level will decrease with appropriate interventions Outcome: Adequate for Discharge   Problem: Skin Integrity: Goal: Demonstration of wound healing without infection will improve Outcome: Adequate for Discharge

## 2018-07-11 NOTE — Progress Notes (Signed)
Report given to [place], Therapist, sports at [employer]. EMS to transport. EMS called.

## 2018-07-13 DIAGNOSIS — I2510 Atherosclerotic heart disease of native coronary artery without angina pectoris: Secondary | ICD-10-CM | POA: Diagnosis not present

## 2018-07-13 DIAGNOSIS — Z89511 Acquired absence of right leg below knee: Secondary | ICD-10-CM | POA: Diagnosis not present

## 2018-07-13 DIAGNOSIS — K5190 Ulcerative colitis, unspecified, without complications: Secondary | ICD-10-CM | POA: Diagnosis not present

## 2018-07-13 DIAGNOSIS — F418 Other specified anxiety disorders: Secondary | ICD-10-CM | POA: Diagnosis not present

## 2018-07-13 DIAGNOSIS — M069 Rheumatoid arthritis, unspecified: Secondary | ICD-10-CM | POA: Diagnosis not present

## 2018-07-13 DIAGNOSIS — E785 Hyperlipidemia, unspecified: Secondary | ICD-10-CM | POA: Diagnosis not present

## 2018-07-13 DIAGNOSIS — I10 Essential (primary) hypertension: Secondary | ICD-10-CM | POA: Diagnosis not present

## 2018-07-13 DIAGNOSIS — K219 Gastro-esophageal reflux disease without esophagitis: Secondary | ICD-10-CM | POA: Diagnosis not present

## 2018-07-15 ENCOUNTER — Emergency Department: Payer: Medicare HMO

## 2018-07-15 ENCOUNTER — Inpatient Hospital Stay: Payer: Medicare HMO

## 2018-07-15 ENCOUNTER — Encounter: Payer: Self-pay | Admitting: *Deleted

## 2018-07-15 ENCOUNTER — Other Ambulatory Visit: Payer: Self-pay

## 2018-07-15 ENCOUNTER — Inpatient Hospital Stay
Admission: EM | Admit: 2018-07-15 | Discharge: 2018-08-17 | DRG: 474 | Disposition: E | Payer: Medicare HMO | Attending: Internal Medicine | Admitting: Internal Medicine

## 2018-07-15 ENCOUNTER — Ambulatory Visit (INDEPENDENT_AMBULATORY_CARE_PROVIDER_SITE_OTHER): Payer: Self-pay | Admitting: Vascular Surgery

## 2018-07-15 DIAGNOSIS — R4182 Altered mental status, unspecified: Secondary | ICD-10-CM

## 2018-07-15 DIAGNOSIS — T8743 Infection of amputation stump, right lower extremity: Principal | ICD-10-CM | POA: Diagnosis present

## 2018-07-15 DIAGNOSIS — Z9911 Dependence on respirator [ventilator] status: Secondary | ICD-10-CM

## 2018-07-15 DIAGNOSIS — E876 Hypokalemia: Secondary | ICD-10-CM | POA: Diagnosis present

## 2018-07-15 DIAGNOSIS — R739 Hyperglycemia, unspecified: Secondary | ICD-10-CM | POA: Diagnosis not present

## 2018-07-15 DIAGNOSIS — Z7952 Long term (current) use of systemic steroids: Secondary | ICD-10-CM

## 2018-07-15 DIAGNOSIS — G9340 Encephalopathy, unspecified: Secondary | ICD-10-CM | POA: Diagnosis present

## 2018-07-15 DIAGNOSIS — J9601 Acute respiratory failure with hypoxia: Secondary | ICD-10-CM | POA: Diagnosis present

## 2018-07-15 DIAGNOSIS — I2510 Atherosclerotic heart disease of native coronary artery without angina pectoris: Secondary | ICD-10-CM | POA: Diagnosis present

## 2018-07-15 DIAGNOSIS — Z8249 Family history of ischemic heart disease and other diseases of the circulatory system: Secondary | ICD-10-CM | POA: Diagnosis not present

## 2018-07-15 DIAGNOSIS — J449 Chronic obstructive pulmonary disease, unspecified: Secondary | ICD-10-CM | POA: Diagnosis present

## 2018-07-15 DIAGNOSIS — I248 Other forms of acute ischemic heart disease: Secondary | ICD-10-CM | POA: Diagnosis present

## 2018-07-15 DIAGNOSIS — Z515 Encounter for palliative care: Secondary | ICD-10-CM

## 2018-07-15 DIAGNOSIS — E785 Hyperlipidemia, unspecified: Secondary | ICD-10-CM | POA: Diagnosis present

## 2018-07-15 DIAGNOSIS — A4159 Other Gram-negative sepsis: Secondary | ICD-10-CM | POA: Diagnosis not present

## 2018-07-15 DIAGNOSIS — R0902 Hypoxemia: Secondary | ICD-10-CM | POA: Diagnosis not present

## 2018-07-15 DIAGNOSIS — E872 Acidosis: Secondary | ICD-10-CM | POA: Diagnosis present

## 2018-07-15 DIAGNOSIS — D638 Anemia in other chronic diseases classified elsewhere: Secondary | ICD-10-CM | POA: Diagnosis present

## 2018-07-15 DIAGNOSIS — Z7902 Long term (current) use of antithrombotics/antiplatelets: Secondary | ICD-10-CM | POA: Diagnosis not present

## 2018-07-15 DIAGNOSIS — R6521 Severe sepsis with septic shock: Secondary | ICD-10-CM | POA: Diagnosis present

## 2018-07-15 DIAGNOSIS — Z791 Long term (current) use of non-steroidal anti-inflammatories (NSAID): Secondary | ICD-10-CM

## 2018-07-15 DIAGNOSIS — I96 Gangrene, not elsewhere classified: Secondary | ICD-10-CM | POA: Diagnosis present

## 2018-07-15 DIAGNOSIS — I480 Paroxysmal atrial fibrillation: Secondary | ICD-10-CM | POA: Diagnosis present

## 2018-07-15 DIAGNOSIS — T8144XA Sepsis following a procedure, initial encounter: Secondary | ICD-10-CM

## 2018-07-15 DIAGNOSIS — Z96652 Presence of left artificial knee joint: Secondary | ICD-10-CM | POA: Diagnosis present

## 2018-07-15 DIAGNOSIS — N179 Acute kidney failure, unspecified: Secondary | ICD-10-CM | POA: Diagnosis present

## 2018-07-15 DIAGNOSIS — Z7901 Long term (current) use of anticoagulants: Secondary | ICD-10-CM

## 2018-07-15 DIAGNOSIS — E7800 Pure hypercholesterolemia, unspecified: Secondary | ICD-10-CM | POA: Diagnosis present

## 2018-07-15 DIAGNOSIS — Z66 Do not resuscitate: Secondary | ICD-10-CM | POA: Diagnosis present

## 2018-07-15 DIAGNOSIS — J9811 Atelectasis: Secondary | ICD-10-CM | POA: Diagnosis not present

## 2018-07-15 DIAGNOSIS — T8753 Necrosis of amputation stump, right lower extremity: Secondary | ICD-10-CM | POA: Diagnosis not present

## 2018-07-15 DIAGNOSIS — Z90710 Acquired absence of both cervix and uterus: Secondary | ICD-10-CM

## 2018-07-15 DIAGNOSIS — I252 Old myocardial infarction: Secondary | ICD-10-CM

## 2018-07-15 DIAGNOSIS — E440 Moderate protein-calorie malnutrition: Secondary | ICD-10-CM | POA: Diagnosis not present

## 2018-07-15 DIAGNOSIS — Z89521 Acquired absence of right knee: Secondary | ICD-10-CM | POA: Diagnosis not present

## 2018-07-15 DIAGNOSIS — Z79899 Other long term (current) drug therapy: Secondary | ICD-10-CM

## 2018-07-15 DIAGNOSIS — Y835 Amputation of limb(s) as the cause of abnormal reaction of the patient, or of later complication, without mention of misadventure at the time of the procedure: Secondary | ICD-10-CM | POA: Diagnosis present

## 2018-07-15 DIAGNOSIS — K589 Irritable bowel syndrome without diarrhea: Secondary | ICD-10-CM | POA: Diagnosis present

## 2018-07-15 DIAGNOSIS — K219 Gastro-esophageal reflux disease without esophagitis: Secondary | ICD-10-CM | POA: Diagnosis not present

## 2018-07-15 DIAGNOSIS — F418 Other specified anxiety disorders: Secondary | ICD-10-CM | POA: Diagnosis not present

## 2018-07-15 DIAGNOSIS — L8990 Pressure ulcer of unspecified site, unspecified stage: Secondary | ICD-10-CM

## 2018-07-15 DIAGNOSIS — J9600 Acute respiratory failure, unspecified whether with hypoxia or hypercapnia: Secondary | ICD-10-CM

## 2018-07-15 DIAGNOSIS — M069 Rheumatoid arthritis, unspecified: Secondary | ICD-10-CM | POA: Diagnosis present

## 2018-07-15 DIAGNOSIS — Z4659 Encounter for fitting and adjustment of other gastrointestinal appliance and device: Secondary | ICD-10-CM

## 2018-07-15 DIAGNOSIS — A419 Sepsis, unspecified organism: Secondary | ICD-10-CM | POA: Diagnosis present

## 2018-07-15 DIAGNOSIS — I10 Essential (primary) hypertension: Secondary | ICD-10-CM | POA: Diagnosis present

## 2018-07-15 DIAGNOSIS — T8744 Infection of amputation stump, left lower extremity: Secondary | ICD-10-CM | POA: Diagnosis present

## 2018-07-15 DIAGNOSIS — Z7982 Long term (current) use of aspirin: Secondary | ICD-10-CM

## 2018-07-15 DIAGNOSIS — R400 Somnolence: Secondary | ICD-10-CM | POA: Diagnosis not present

## 2018-07-15 DIAGNOSIS — J986 Disorders of diaphragm: Secondary | ICD-10-CM | POA: Diagnosis not present

## 2018-07-15 DIAGNOSIS — Z4682 Encounter for fitting and adjustment of non-vascular catheter: Secondary | ICD-10-CM | POA: Diagnosis not present

## 2018-07-15 DIAGNOSIS — Z452 Encounter for adjustment and management of vascular access device: Secondary | ICD-10-CM | POA: Diagnosis not present

## 2018-07-15 DIAGNOSIS — R404 Transient alteration of awareness: Secondary | ICD-10-CM | POA: Diagnosis not present

## 2018-07-15 DIAGNOSIS — Z981 Arthrodesis status: Secondary | ICD-10-CM

## 2018-07-15 DIAGNOSIS — F419 Anxiety disorder, unspecified: Secondary | ICD-10-CM | POA: Diagnosis present

## 2018-07-15 DIAGNOSIS — J90 Pleural effusion, not elsewhere classified: Secondary | ICD-10-CM | POA: Diagnosis not present

## 2018-07-15 DIAGNOSIS — I998 Other disorder of circulatory system: Secondary | ICD-10-CM | POA: Diagnosis not present

## 2018-07-15 DIAGNOSIS — R0689 Other abnormalities of breathing: Secondary | ICD-10-CM | POA: Diagnosis not present

## 2018-07-15 DIAGNOSIS — R000 Tachycardia, unspecified: Secondary | ICD-10-CM | POA: Diagnosis not present

## 2018-07-15 LAB — CBC WITH DIFFERENTIAL/PLATELET
Abs Immature Granulocytes: 0.42 10*3/uL — ABNORMAL HIGH (ref 0.00–0.07)
Basophils Absolute: 0.1 10*3/uL (ref 0.0–0.1)
Basophils Relative: 0 %
Eosinophils Absolute: 0.1 10*3/uL (ref 0.0–0.5)
Eosinophils Relative: 0 %
HCT: 28.6 % — ABNORMAL LOW (ref 36.0–46.0)
Hemoglobin: 9 g/dL — ABNORMAL LOW (ref 12.0–15.0)
Immature Granulocytes: 2 %
Lymphocytes Relative: 1 %
Lymphs Abs: 0.3 10*3/uL — ABNORMAL LOW (ref 0.7–4.0)
MCH: 29.9 pg (ref 26.0–34.0)
MCHC: 31.5 g/dL (ref 30.0–36.0)
MCV: 95 fL (ref 80.0–100.0)
MONO ABS: 2.2 10*3/uL — AB (ref 0.1–1.0)
Monocytes Relative: 8 %
Neutro Abs: 25.8 10*3/uL — ABNORMAL HIGH (ref 1.7–7.7)
Neutrophils Relative %: 89 %
Platelets: 384 10*3/uL (ref 150–400)
RBC: 3.01 MIL/uL — ABNORMAL LOW (ref 3.87–5.11)
RDW: 14.6 % (ref 11.5–15.5)
SMEAR REVIEW: NORMAL
WBC: 28.9 10*3/uL — ABNORMAL HIGH (ref 4.0–10.5)
nRBC: 0 % (ref 0.0–0.2)

## 2018-07-15 LAB — BLOOD GAS, ARTERIAL
Acid-base deficit: 11.6 mmol/L — ABNORMAL HIGH (ref 0.0–2.0)
Acid-base deficit: 13.1 mmol/L — ABNORMAL HIGH (ref 0.0–2.0)
Acid-base deficit: 5.2 mmol/L — ABNORMAL HIGH (ref 0.0–2.0)
Bicarbonate: 13.5 mmol/L — ABNORMAL LOW (ref 20.0–28.0)
Bicarbonate: 12.7 mmol/L — ABNORMAL LOW (ref 20.0–28.0)
Bicarbonate: 18.9 mmol/L — ABNORMAL LOW (ref 20.0–28.0)
FIO2: 0.21
FIO2: 0.21
FIO2: 0.21
LHR: 16 {breaths}/min
MECHVT: 400 mL
MECHVT: 400 mL
O2 Saturation: 95.4 %
O2 Saturation: 95 %
O2 Saturation: 96.4 %
PATIENT TEMPERATURE: 37
PCO2 ART: 32 mmHg (ref 32.0–48.0)
PEEP: 5 cmH2O
PEEP: 5 cmH2O
PEEP: 5 cmH2O
Patient temperature: 38.1
Patient temperature: 37
RATE: 16 resp/min
RATE: 16 resp/min
VT: 400 mL
pCO2 arterial: 28 mmHg — ABNORMAL LOW (ref 32.0–48.0)
pCO2 arterial: 29 mmHg — ABNORMAL LOW (ref 32.0–48.0)
pH, Arterial: 7.27 — ABNORMAL LOW (ref 7.350–7.450)
pH, Arterial: 7.25 — ABNORMAL LOW (ref 7.350–7.450)
pH, Arterial: 7.38 (ref 7.350–7.450)
pO2, Arterial: 93 mmHg (ref 83.0–108.0)
pO2, Arterial: 88 mmHg (ref 83.0–108.0)
pO2, Arterial: 87 mmHg (ref 83.0–108.0)

## 2018-07-15 LAB — COMPREHENSIVE METABOLIC PANEL
ALT: 52 U/L — AB (ref 0–44)
AST: 74 U/L — AB (ref 15–41)
Albumin: 2.7 g/dL — ABNORMAL LOW (ref 3.5–5.0)
Alkaline Phosphatase: 81 U/L (ref 38–126)
Anion gap: 16 — ABNORMAL HIGH (ref 5–15)
BUN: 110 mg/dL — AB (ref 8–23)
CO2: 17 mmol/L — ABNORMAL LOW (ref 22–32)
Calcium: 8.1 mg/dL — ABNORMAL LOW (ref 8.9–10.3)
Chloride: 102 mmol/L (ref 98–111)
Creatinine, Ser: 2.58 mg/dL — ABNORMAL HIGH (ref 0.44–1.00)
GFR calc Af Amer: 20 mL/min — ABNORMAL LOW (ref >60)
GFR, EST NON AFRICAN AMERICAN: 17 mL/min — AB (ref >60)
Glucose, Bld: 136 mg/dL — ABNORMAL HIGH (ref 70–99)
Potassium: 4.9 mmol/L (ref 3.5–5.1)
Sodium: 135 mmol/L (ref 135–145)
Total Bilirubin: 0.6 mg/dL (ref 0.3–1.2)
Total Protein: 6.5 g/dL (ref 6.5–8.1)

## 2018-07-15 LAB — URINALYSIS, ROUTINE W REFLEX MICROSCOPIC
Bilirubin Urine: NEGATIVE
Glucose, UA: NEGATIVE mg/dL
Hgb urine dipstick: NEGATIVE
KETONES UR: NEGATIVE mg/dL
LEUKOCYTES UA: NEGATIVE
Nitrite: NEGATIVE
Protein, ur: NEGATIVE mg/dL
Specific Gravity, Urine: 1.017 (ref 1.005–1.030)
pH: 5 (ref 5.0–8.0)

## 2018-07-15 LAB — CG4 I-STAT (LACTIC ACID): Lactic Acid, Venous: 1.07 mmol/L (ref 0.5–1.9)

## 2018-07-15 LAB — TROPONIN I
Troponin I: 0.09 ng/mL (ref <0.03)
Troponin I: 0.07 ng/mL (ref <0.03)

## 2018-07-15 LAB — GLUCOSE, CAPILLARY
Glucose-Capillary: 145 mg/dL — ABNORMAL HIGH (ref 70–99)
Glucose-Capillary: 148 mg/dL — ABNORMAL HIGH (ref 70–99)
Glucose-Capillary: 148 mg/dL — ABNORMAL HIGH (ref 70–99)

## 2018-07-15 LAB — INFLUENZA PANEL BY PCR (TYPE A & B)
Influenza A By PCR: NEGATIVE
Influenza B By PCR: NEGATIVE

## 2018-07-15 LAB — MRSA PCR SCREENING: MRSA by PCR: NEGATIVE

## 2018-07-15 MED ORDER — VANCOMYCIN HCL IN DEXTROSE 1-5 GM/200ML-% IV SOLN
1000.0000 mg | Freq: Once | INTRAVENOUS | Status: AC
  Administered 2018-07-15: 1000 mg via INTRAVENOUS
  Filled 2018-07-15: qty 200

## 2018-07-15 MED ORDER — SODIUM CHLORIDE 0.9 % IV BOLUS
1000.0000 mL | Freq: Once | INTRAVENOUS | Status: AC
  Administered 2018-07-15: 1000 mL via INTRAVENOUS

## 2018-07-15 MED ORDER — VANCOMYCIN HCL IN DEXTROSE 750-5 MG/150ML-% IV SOLN
750.0000 mg | INTRAVENOUS | Status: DC
  Filled 2018-07-15: qty 150

## 2018-07-15 MED ORDER — SUCCINYLCHOLINE CHLORIDE 20 MG/ML IJ SOLN
100.0000 mg | Freq: Once | INTRAMUSCULAR | Status: AC
  Administered 2018-07-15: 100 mg via INTRAVENOUS

## 2018-07-15 MED ORDER — PROPOFOL 1000 MG/100ML IV EMUL
5.0000 ug/kg/min | INTRAVENOUS | Status: DC
  Administered 2018-07-15: 5 ug/kg/min via INTRAVENOUS
  Administered 2018-07-15 – 2018-07-16 (×3): 40 ug/kg/min via INTRAVENOUS
  Administered 2018-07-17: 30 ug/kg/min via INTRAVENOUS
  Administered 2018-07-17: 25 ug/kg/min via INTRAVENOUS
  Administered 2018-07-17 – 2018-07-18 (×2): 40 ug/kg/min via INTRAVENOUS
  Administered 2018-07-18: 25 ug/kg/min via INTRAVENOUS
  Administered 2018-07-19: 30 ug/kg/min via INTRAVENOUS
  Filled 2018-07-15 (×9): qty 100

## 2018-07-15 MED ORDER — PROPOFOL 1000 MG/100ML IV EMUL
INTRAVENOUS | Status: AC
  Filled 2018-07-15: qty 100

## 2018-07-15 MED ORDER — RISPERIDONE 1 MG PO TABS
1.0000 mg | ORAL_TABLET | Freq: Every day | ORAL | Status: DC
  Administered 2018-07-15 – 2018-07-18 (×3): 1 mg via ORAL
  Filled 2018-07-15 (×5): qty 1

## 2018-07-15 MED ORDER — SODIUM CHLORIDE 0.9 % IV SOLN
0.0000 ug/min | INTRAVENOUS | Status: DC
  Administered 2018-07-15: 150 ug/min via INTRAVENOUS
  Administered 2018-07-15: 50 ug/min via INTRAVENOUS
  Administered 2018-07-16: 120 ug/min via INTRAVENOUS
  Administered 2018-07-16: 150 ug/min via INTRAVENOUS
  Administered 2018-07-16: 140 ug/min via INTRAVENOUS
  Administered 2018-07-16: 120 ug/min via INTRAVENOUS
  Filled 2018-07-15: qty 40
  Filled 2018-07-15: qty 4
  Filled 2018-07-15: qty 40
  Filled 2018-07-15: qty 4
  Filled 2018-07-15 (×3): qty 40

## 2018-07-15 MED ORDER — METRONIDAZOLE IN NACL 5-0.79 MG/ML-% IV SOLN
500.0000 mg | Freq: Three times a day (TID) | INTRAVENOUS | Status: DC
  Administered 2018-07-15 – 2018-07-16 (×3): 500 mg via INTRAVENOUS
  Filled 2018-07-15 (×5): qty 100

## 2018-07-15 MED ORDER — ETOMIDATE 2 MG/ML IV SOLN
10.0000 mg | Freq: Once | INTRAVENOUS | Status: AC
  Administered 2018-07-15: 10 mg via INTRAVENOUS

## 2018-07-15 MED ORDER — SODIUM CHLORIDE 0.9 % IV SOLN
INTRAVENOUS | Status: DC
  Administered 2018-07-15 – 2018-07-16 (×3): via INTRAVENOUS
  Administered 2018-07-17: 75 mL/h via INTRAVENOUS
  Administered 2018-07-18: 03:00:00 via INTRAVENOUS
  Administered 2018-07-18: 75 mL/h via INTRAVENOUS
  Administered 2018-07-19: 03:00:00 via INTRAVENOUS

## 2018-07-15 MED ORDER — OXYCODONE HCL 5 MG PO TABS: 5.0000 mg | ORAL_TABLET | ORAL | Status: DC | PRN

## 2018-07-15 MED ORDER — HYDROCORTISONE NA SUCCINATE PF 100 MG IJ SOLR
100.0000 mg | Freq: Three times a day (TID) | INTRAMUSCULAR | Status: DC
  Administered 2018-07-15 – 2018-07-18 (×9): 100 mg via INTRAVENOUS
  Filled 2018-07-15 (×9): qty 2

## 2018-07-15 MED ORDER — METOPROLOL TARTRATE 5 MG/5ML IV SOLN
2.5000 mg | Freq: Once | INTRAVENOUS | Status: AC
  Administered 2018-07-15: 2.5 mg via INTRAVENOUS
  Filled 2018-07-15: qty 5

## 2018-07-15 MED ORDER — SODIUM CHLORIDE 0.9 % IV SOLN
2.0000 g | Freq: Once | INTRAVENOUS | Status: AC
  Administered 2018-07-15: 2 g via INTRAVENOUS
  Filled 2018-07-15: qty 2

## 2018-07-15 MED ORDER — SODIUM CHLORIDE 0.9 % IV SOLN
1.0000 g | Freq: Two times a day (BID) | INTRAVENOUS | Status: DC
  Filled 2018-07-15: qty 1

## 2018-07-15 MED ORDER — VASOPRESSIN 20 UNIT/ML IV SOLN
0.0300 [IU]/min | INTRAVENOUS | Status: DC
  Administered 2018-07-15 – 2018-07-16 (×2): 0.03 [IU]/min via INTRAVENOUS
  Filled 2018-07-15 (×3): qty 2

## 2018-07-15 MED ORDER — SERTRALINE HCL 50 MG PO TABS
100.0000 mg | ORAL_TABLET | Freq: Every day | ORAL | Status: DC
  Administered 2018-07-17 – 2018-07-19 (×3): 100 mg via ORAL
  Filled 2018-07-15 (×3): qty 2

## 2018-07-15 MED ORDER — SODIUM BICARBONATE 8.4 % IV SOLN
150.0000 meq | Freq: Once | INTRAVENOUS | Status: AC
  Administered 2018-07-15: 150 meq via INTRAVENOUS
  Filled 2018-07-15: qty 150

## 2018-07-15 MED ORDER — NOREPINEPHRINE BITARTRATE 1 MG/ML IV SOLN
0.0000 ug/min | INTRAVENOUS | Status: DC
  Administered 2018-07-15: 5 ug/min via INTRAVENOUS
  Filled 2018-07-15: qty 4

## 2018-07-15 MED ORDER — MIDAZOLAM HCL 5 MG/5ML IJ SOLN
2.0000 mg | Freq: Once | INTRAMUSCULAR | Status: AC
  Administered 2018-07-15: 2 mg via INTRAVENOUS

## 2018-07-15 NOTE — ED Provider Notes (Addendum)
[facility] Emergency Department Provider Note  Time seen: 12:59 PM  I have reviewed the triage vital signs and the nursing notes.   HISTORY  Chief Complaint Altered Mental Status    HPI Melissa MONCEAUX is a 77 y.o. female with a past medical history of anemia, COPD, CAD, depression, gastric reflux, hypertension, hyperlipidemia, bilateral BKA's, presents to the emergency department as a emergency traffic for code sepsis.  Upon arrival patient is awake, alert oriented to person and place but not oriented to time.  Baseline is oriented x3.  Patient found to be febrile in route greater than 101.  Patient denies any cough or congestion vomiting or diarrhea.  Denies chest or abdominal pain.  Patient does have a recent right BKA.  Per report they were concerned over the weekend and possible infection.   Past Medical History:  Diagnosis Date  . Anemia 12/2017   blood transfusion after amputation  . Anxiety   . Arthritis    rheumatoid  . Ataxia   . Chronic ulcer of right great toe ([place]) 2019   slowly healing  . COPD (chronic obstructive pulmonary disease) ([place])   . Coronary artery disease 2018   complete blockage in one vessel, 40 % in another, 25% in 3rd vessell  . Depression   . Diverticulosis   . Dysrhythmia    atrial fib  . Excessive falling   . GERD (gastroesophageal reflux disease)    ulcerative colitis  . Hypercholesteremia   . Hyperlipidemia   . Hypertension   . IBS (irritable bowel syndrome)   . MRSA (methicillin resistant staph aureus) culture positive 11/2017   patient unaware of this diagnosis, was never informed or treated  . Myocardial infarction ([place]) 08/2017   had angio but no stents  . Osteoarthritis   . Psoriasis   . Raynaud's disease without gangrene   . Raynaud's disease without gangrene   . Shortness of breath dyspnea    with exertion  . Transfusion history 12/2017   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)    Patient Active Problem  List   Diagnosis Date Noted  . Protein-calorie malnutrition, severe 07/03/2018  . Ischemic leg 06/26/2018  . Complication of below knee amputation stump ([place]) 02/04/2018  . Pressure ulcer of BKA stump, unstageable ([place]) 01/14/2018  . 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 Atherosclerosis of native arteries of extremity with rest pain ([place]) 11/18/2017  . Essential hypertension 11/18/2017  . Pure hypercholesterolemia 11/18/2017  . GERD (gastroesophageal reflux disease) 11/18/2017  . Colitis 04/19/2017  . UTI due to extended-spectrum beta lactamase (ESBL) producing Escherichia coli 04/19/2017  . Other secondary osteoarthritis of one knee 04/04/2016    Past Surgical History:  Procedure Laterality Date  . ABDOMINAL HYSTERECTOMY  1980  . AMPUTATION Left 12/12/2017   Procedure: AMPUTATION BELOW KNEE;  Surgeon: Katha Cabal, MD;  Location: [facility] ORS;  Service: Vascular;  Laterality: Left;  . AMPUTATION Left 02/08/2018   Procedure: AMPUTATION BELOW KNEE ( REVISION );  Surgeon: Katha Cabal, MD;  Location: [facility] ORS;  Service: Vascular;  Laterality: Left;  . AMPUTATION Right 07/01/2018   Procedure: AMPUTATION BELOW KNEE;  Surgeon: Katha Cabal, MD;  Location: [facility] ORS;  Service: Vascular;  Laterality: Right;  . APPENDECTOMY  1956  . APPLICATION OF WOUND VAC Left 01/25/2018   Procedure: APPLICATION OF WOUND VAC;  Surgeon: Katha Cabal, MD;  Location: [facility] ORS;  Service: Vascular;  Laterality: Left;  . APPLICATION OF WOUND VAC Left 03/08/2018  Procedure: APPLICATION OF WOUND VAC;  Surgeon: Katha Cabal, MD;  Location: [facility] ORS;  Service: Vascular;  Laterality: Left;  . BACK SURGERY  2013   Spinal fusion with rods and screws  . CHOLECYSTECTOMY    . COLONOSCOPY WITH PROPOFOL N/A 01/29/2017   Procedure: COLONOSCOPY WITH PROPOFOL;  Surgeon: Manya Silvas, MD;  Location: [facility] ENDOSCOPY;  Service: Endoscopy;  Laterality: N/A;  . ESOPHAGOGASTRODUODENOSCOPY (EGD) WITH PROPOFOL N/A  01/29/2017   Procedure: ESOPHAGOGASTRODUODENOSCOPY (EGD) WITH PROPOFOL;  Surgeon: Manya Silvas, MD;  Location: [facility] ENDOSCOPY;  Service: Endoscopy;  Laterality: N/A;  . EYE SURGERY Bilateral 2012   Cataract Extraction with IOL  . KNEE ARTHROSCOPY Left 2009   partial lateral meniscectomy, debridement, excision of Baker's cyst  . LEFT HEART CATH AND CORONARY ANGIOGRAPHY N/A 04/23/2017   Procedure: LEFT HEART CATH AND CORONARY ANGIOGRAPHY;  Surgeon: Teodoro Spray, MD;  Location: [place] CV LAB;  Service: Cardiovascular;  Laterality: N/A;  . LEFT HEART CATH AND CORONARY ANGIOGRAPHY Left 09/07/2017   Procedure: LEFT HEART CATH AND CORONARY ANGIOGRAPHY;  Surgeon: Teodoro Spray, MD;  Location: [place] [facility];  Service: Cardiovascular;  Laterality: Left;  . LOWER EXTREMITY ANGIOGRAPHY Left 11/27/2017   Procedure: LOWER EXTREMITY ANGIOGRAPHY;  Surgeon: Katha Cabal, MD;  Location: [place] [facility];  Service: Cardiovascular;  Laterality: Left;  . LOWER EXTREMITY ANGIOGRAPHY Left 12/05/2017   Procedure: LOWER EXTREMITY ANGIOGRAPHY;  Surgeon: Katha Cabal, MD;  Location: [place] [facility];  Service: Cardiovascular;  Laterality: Left;  . LOWER EXTREMITY ANGIOGRAPHY Right 06/21/2018   Procedure: LOWER EXTREMITY ANGIOGRAPHY;  Surgeon: Katha Cabal, MD;  Location: [place] [facility];  Service: Cardiovascular;  Laterality: Right;  . LOWER EXTREMITY ANGIOGRAPHY Right 06/26/2018   Procedure: LOWER EXTREMITY ANGIOGRAPHY;  Surgeon: Katha Cabal, MD;  Location: [place] [facility];  Service: Cardiovascular;  Laterality: Right;  . LOWER EXTREMITY ANGIOGRAPHY Right 06/28/2018   Procedure: Lower Extremity Angiography;  Surgeon: Katha Cabal, MD;  Location: [place] [facility];  Service: Cardiovascular;  Laterality: Right;  . ROTATOR CUFF REPAIR Right 1990  . TOTAL KNEE ARTHROPLASTY Left 04/04/2016   Procedure: TOTAL KNEE ARTHROPLASTY;  Surgeon: Hessie Knows, MD;   Location: [facility] ORS;  Service: Orthopedics;  Laterality: Left;  . WOUND DEBRIDEMENT Left 01/25/2018   Procedure: DEBRIDEMENT WOUND;  Surgeon: Katha Cabal, MD;  Location: [facility] ORS;  Service: Vascular;  Laterality: Left;  . WOUND DEBRIDEMENT Left 03/08/2018   Procedure: DEBRIDEMENT WOUND;  Surgeon: Katha Cabal, MD;  Location: [facility] ORS;  Service: Vascular;  Laterality: Left;    Prior to Admission medications   Medication Sig Start Date End Date Taking? Authorizing Provider  acetaminophen (TYLENOL) 500 MG tablet Take 1,000 mg by mouth every 8 (eight) hours as needed for mild pain or headache.    [provider]  apixaban (ELIQUIS) 5 MG TABS tablet Take 1 tablet (5 mg total) by mouth 2 (two) times daily. 04/24/17   Hillary Bow, MD  aspirin EC 81 MG tablet Take 1 tablet (81 mg total) by mouth daily. 04/24/17   Hillary Bow, MD  budesonide (ENTOCORT EC) 3 MG 24 hr capsule Take 3 capsules (9 mg total) by mouth daily. Taper down to 3 mg Patient taking differently: Take 3 mg by mouth daily.  12/19/17   Schnier, Dolores Lory, MD  Calcium Carbonate-Vitamin D (CALCIUM 600+D) 600-400 MG-UNIT tablet Take 1 tablet by mouth 2 (two) times daily.  [provider]  clopidogrel (PLAVIX) 75 MG tablet TAKE 1 TABLET BY MOUTH EVERY DAY 02/22/18   Schnier, Dolores Lory, MD  diclofenac sodium (VOLTAREN) 1 % GEL Apply 2 g topically 2 (two) times daily as needed (for arthritis in hands).  05/22/18   [provider]  diltiazem (CARDIZEM CD) 240 MG 24 hr capsule Take 240 mg by mouth daily.    [provider]  feeding supplement, ENSURE ENLIVE, (ENSURE ENLIVE) LIQD Take 237 mLs by mouth 2 (two) times daily between meals. 02/12/18   Stegmayer, Joelene Millin A, PA-C  ferrous sulfate 325 (65 FE) MG tablet Take 325 mg by mouth daily with breakfast.    [provider]  gabapentin (NEURONTIN) 300 MG capsule Take 1 capsule (300 mg total) by mouth 3 (three) times daily. 07/09/18   Salary,  [name], MD  gabapentin (NEURONTIN) 300 MG capsule Take 1 capsule (300 mg total) by mouth at bedtime. 07/11/18   Salary, [name], MD  gemfibrozil (LOPID) 600 MG tablet Take 600 mg by mouth 2 (two) times daily before a meal.    [provider]  HYDROcodone-acetaminophen (NORCO) 5-325 MG tablet Take 1-2 tablets by mouth every 6 (six) hours as needed for moderate pain or severe pain. 07/09/18   Salary, [name], MD  hydroxychloroquine (PLAQUENIL) 200 MG tablet Take 200 mg by mouth 2 (two) times daily.     [provider]  leflunomide (ARAVA) 20 MG tablet Take 20 mg by mouth daily.    [provider]  lisinopril (PRINIVIL,ZESTRIL) 5 MG tablet Take 5 mg by mouth daily.    [provider]  LORazepam (ATIVAN) 0.5 MG tablet Take 2 tablets (1 mg total) by mouth 2 (two) times daily. 07/09/18   Salary, [name], MD  LORazepam (ATIVAN) 1 MG tablet Take 1 tablet (1 mg total) by mouth at bedtime. 07/09/18   Salary, [name], MD  Multiple Vitamin (MULTIVITAMIN WITH MINERALS) TABS tablet Take 1 tablet by mouth daily.    [provider]  Multiple Vitamins-Minerals (PRESERVISION AREDS) CAPS Take 1 capsule by mouth 2 (two) times daily.    [provider]  mupirocin ointment (BACTROBAN) 2 % Apply to affected area 2 times daily 06/22/18 06/22/19  Shelda Altes, MD  Nutritional Supplements (NUTRITIONAL SHAKE HIGH PROTEIN) LIQD Take 237 mLs by mouth daily. Slim Fast protein    [provider]  omeprazole (PRILOSEC) 20 MG capsule Take 20 mg by mouth 2 (two) times daily before a meal.     [provider]  pravastatin (PRAVACHOL) 80 MG tablet Take 80 mg by mouth at bedtime.     [provider]  predniSONE (DELTASONE) 5 MG tablet Take 5 mg by mouth daily with breakfast.     [provider]  risperiDONE (RISPERDAL) 1 MG tablet Take 1 tablet (1 mg total) by mouth at bedtime. 07/09/18   Salary, Holly Bodily D, MD  sertraline (ZOLOFT) 100  MG tablet Take 100 mg by mouth daily.    [provider]  traMADol (ULTRAM) 50 MG tablet Take 1 tablet (50 mg total) by mouth every 6 (six) hours as needed for moderate pain. 07/09/18   Salary, [name], MD    Allergies  Allergen Reactions  . Lodine [Etodolac] Hives and Itching  . Methotrexate Derivatives Other (See Comments)    Flu like symptoms  . Nitrofuran Derivatives Nausea And Vomiting and Other (See Comments)    Made sicker   . Levaquin [Levofloxacin] Itching,  Rash and Other (See Comments)    Muscle pain  . Norvasc [Amlodipine] Other (See Comments)    Unknown   . Pentasa [Mesalamine Er] Other (See Comments)    Unknown   . Sulfa Antibiotics Itching and Rash  . Vioxx [Rofecoxib] Other (See Comments)    Unknown     Family History  Problem Relation Age of Onset  . CAD Mother   . CAD Father   . Alzheimer's disease Sister   . CAD Sister   . CAD Brother   . Breast cancer Neg Hx     Social History Social History   Tobacco Use  . Smoking status: Never Smoker  . Smokeless tobacco: Never Used  Substance Use Topics  . Alcohol use: No  . Drug use: No    Review of Systems Constitutional: Fever to 101. Cardiovascular: Negative for chest pain. Respiratory: Negative for shortness of breath.  Negative for cough. Gastrointestinal: Negative for abdominal pain, vomiting and diarrhea. Genitourinary: Negative for dysuria. Musculoskeletal: Denies pain, has bilateral BKA's, recent right BKA. Skin: Patient has wounds to the right BKA stump. Neurological: Negative for headache All other ROS negative  ____________________________________________   PHYSICAL EXAM:  VITAL SIGNS: ED Triage Vitals  Enc Vitals Group     BP 07/11/2018 1245 (!) 123/112     Pulse Rate 07/11/2018 1245 (!) 124     Resp 07/10/2018 1245 18     Temp --      Temp src --      SpO2 06/30/2018 1240 (!) 88 %     Weight 07/03/2018 1248 107 lb 12.9 oz (48.9 kg)     Height 07/05/2018 1248 5' 4"  (1.626 m)      Head Circumference --      Peak Flow --      Pain Score --      Pain Loc --      Pain Edu? --      Excl. in [place]? --    Constitutional: Alert and oriented to person and place only.  Somnolent appearing but awakens to voice and will answer questions appears to be largely accurate. Eyes: Normal exam ENT   Head: Normocephalic and atraumatic.   Mouth/Throat: Mucous membranes are moist. Cardiovascular: Normal rate, regular rhythm around 120 bpm. Respiratory: Normal respiratory effort without tachypnea nor retractions. Breath sounds are clear.  No wheeze rales or rhonchi. Gastrointestinal: Soft, nontender.  No distention. Musculoskeletal: Bilateral BKA's, left appears older, right appears new area.  Patient has granulation tissue with mild drainage/exudate to the stump of the right lower extremity Neurologic: Somnolent but does have normal speech when awake.  Appears to be able to move all extremities. Skin:  Skin is warm.  Patient does have granulation tissue with mild exudate and erythema to the right BKA stump. Psychiatric: Mood and affect are normal.   ____________________________________________     RADIOLOGY  Chest x-ray shows bibasilar atelectasis versus infiltrates. Knee x-ray shows small amount of soft tissue gas in the distal portion of the BKA stump suggesting infection.  EKG viewed and interpreted by myself shows a sinus rhythm at 132 bpm with a slightly widened QRS, normal axis, largely normal intervals with nonspecific ST changes. ____________________________________________   INITIAL IMPRESSION / ASSESSMENT AND PLAN / ED COURSE  Pertinent labs & imaging results that were available during my care of the patient were reviewed by me and considered in my medical decision making (see chart for details).  Patient presents to the emergency department via  emergency traffic for a code sepsis.  Patient initially hypotensive 72/42, has increased to 628 systolic after IV  fluids.  We will continue with IV hydration.  We will send labs, start broad-spectrum antibiotics, check cultures.  We will obtain x-ray imaging and continue to closely monitor.  Differential at this time would include infectious etiology such as pneumonia, urinary tract infection, stump infection/cellulitis.  In reviewing the patient's records it appears she had a right BKA performed 07/01/2018 by Dr. Delana Meyer.  This obviously would be a concern for possible postoperative infection given exudative discharge from the area.  No obvious fluctuance or signs of abscess.  We will obtain imaging of the area to rule out gas formation.  Patient's blood pressure has dropped once again she is hypotensive.  Map of 51.  Bolusing IV fluids.  After 1 L bolus patient's blood pressure is currently 84/50, and additional liter fluid is currently infusing.  Patient continues to be somnolent but awakens to voice and will continue to answer questions.  Protecting her airway appropriately.  Family is here with the patient.  Patient's labs have begun to result showing a significant leukocytosis of 28,000, however reassuringly lactic acid of 1.07.  Urinalysis appears normal.    ----------------------------------------- 1:55 PM on 06/19/2018 -----------------------------------------  Discussed the patient with Dr. Delana Meyer of vascular surgery given cast found in the patient's soft tissue x-ray of the BKA stump.  Patient will obviously require medical admission for stabilization, vascular surgery will be consulting and will see the patient in the emergency department.  Blood pressure remains borderline.  We will start norepinephrine.  I have placed a right internal jugular central line.  Chemistry is resulted consistent with acute renal failure.  Vascular surgery has seen the patient.  Would likely take to the OR today.  Patient will require admission likely to the intensive care unit.  Patient has since deteriorated in the  emergency department and has become largely nonresponsive.  She does awaken to sternal rub but otherwise has been much less responsive.  We will intubate for airway protection.  Patient has a bed in the ICU waiting once intubation is performed and verified via chest x-ray we will move upstairs.  CENTRAL LINE Performed by: Harvest Dark Consent: The procedure was performed in an emergent situation. Required items: required blood products, implants, devices, and special equipment available Patient identity confirmed: arm band and provided demographic data Time out: Immediately prior to procedure a "time out" was called to verify the correct patient, procedure, equipment, support staff and site/side marked as required. Indications: vascular access Anesthesia: local infiltration Local anesthetic: lidocaine 1% with epinephrine Anesthetic total: 3 ml Patient sedated: no Preparation: skin prepped with 2% chlorhexidine Skin prep agent dried: skin prep agent completely dried prior to procedure Sterile barriers: all five maximum sterile barriers used - cap, mask, sterile gown, sterile gloves, and large sterile sheet Hand hygiene: hand hygiene performed prior to central venous catheter insertion  Location details: Right internal jugular  Catheter type: triple lumen Catheter size: 8 Fr Pre-procedure: landmarks identified Ultrasound guidance: Yes Successful placement: yes Post-procedure: line sutured and dressing applied Assessment: blood return through all parts, free fluid flow, placement verified by x-ray and no pneumothorax on x-ray Patient tolerance: Patient tolerated the procedure well with no immediate complications.      CRITICAL CARE Performed by: Harvest Dark   Total critical care time: 60 minutes  Critical care time was exclusive of separately billable procedures and treating other patients.  Critical care  was necessary to treat or prevent imminent or life-threatening  deterioration.  Critical care was time spent personally by me on the following activities: development of treatment plan with patient and/or surrogate as well as nursing, discussions with consultants, evaluation of patient's response to treatment, examination of patient, obtaining history from patient or surrogate, ordering and performing treatments and interventions, ordering and review of laboratory studies, ordering and review of radiographic studies, pulse oximetry and re-evaluation of patient's condition.  INTUBATION Performed by: Harvest Dark  Required items: required blood products, implants, devices, and special equipment available Patient identity confirmed: provided demographic data and hospital-assigned identification number Time out: Immediately prior to procedure a "time out" was called to verify the correct patient, procedure, equipment, support staff and site/side marked as required.  Indications: Airway protection  Intubation method: 4.0 Glidescope Laryngoscopy   Preoxygenation: 100% BVM  Sedatives: 10 mg etomidate Paralytic: 100 mg succinylcholine  Tube Size: 1.0 cuffed  Post-procedure assessment: chest rise and ETCO2 monitor Breath sounds: equal and absent over the epigastrium Tube secured with: ETT holder Chest x-ray interpreted by radiologist and me.  Chest x-ray findings: endotracheal tube in appropriate position  Patient tolerated the procedure well with no immediate complications.      ____________________________________________   FINAL CLINICAL IMPRESSION(S) / ED DIAGNOSES  Sepsis Fever Somnolence/altered mental status   Harvest Dark, MD 06/26/2018 1439    Harvest Dark, MD 07/03/2018 1445    Harvest Dark, MD 07/02/2018 1448    Harvest Dark, MD 07/05/2018 1549

## 2018-07-15 NOTE — ED Notes (Signed)
Unable to call report at this time due to RN not being available.

## 2018-07-15 NOTE — Consult Note (Signed)
Reason for Consult: Septic shock, respiratory failure Referring Physician: Dr. Gertie Exon Melissa [name] is an 77 y.o. female.  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  Found to be unresponsive, hypotensive requiring central line placement, fluid resuscitation, intubation, pressors and broad-spectrum antibiotic coverage.  Has been seen by vascular surgery who plans on performing a debridement.  At the present time patient arrives in the ICU, intubated, unresponsive, on norepinephrine  Past Medical History:  Diagnosis Date  . Anemia 12/2017   blood transfusion after amputation  . Anxiety   . Arthritis    rheumatoid  . Ataxia   . Chronic ulcer of right great toe ([place]) 2019   slowly healing  . COPD (chronic obstructive pulmonary disease) ([place])   . Coronary artery disease 2018   complete blockage in one vessel, 40 % in another, 25% in 3rd vessell  . Depression   . Diverticulosis   . Dysrhythmia    atrial fib  . Excessive falling   . GERD (gastroesophageal reflux disease)    ulcerative colitis  . Hypercholesteremia   . Hyperlipidemia   . Hypertension   . IBS (irritable bowel syndrome)   . MRSA (methicillin resistant staph aureus) culture positive 11/2017   patient unaware of this diagnosis, was never informed or treated  . Myocardial infarction ([place]) 08/2017   had angio but no stents  . Osteoarthritis   . Psoriasis   . Raynaud's disease without gangrene   . Raynaud's disease without gangrene   . Shortness of breath dyspnea    with exertion  . Transfusion history 12/2017   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)    Past Surgical History:   Procedure Laterality Date  . ABDOMINAL HYSTERECTOMY  1980  . AMPUTATION Left 12/12/2017   Procedure: AMPUTATION BELOW KNEE;  Surgeon: Katha Cabal, MD;  Location: [facility] ORS;  Service: Vascular;  Laterality: Left;  . AMPUTATION Left 02/08/2018   Procedure: AMPUTATION BELOW KNEE ( REVISION );  Surgeon: Katha Cabal, MD;  Location: [facility];  Service: Vascular;  Laterality: Left;  . AMPUTATION Right 07/01/2018   Procedure: AMPUTATION BELOW KNEE;  Surgeon: Katha Cabal, MD;  Location: [facility];  Service: Vascular;  Laterality: Right;  . APPENDECTOMY  1956  . APPLICATION OF WOUND VAC Left 01/25/2018   Procedure: APPLICATION OF WOUND VAC;  Surgeon: Katha Cabal, MD;  Location: [facility];  Service: Vascular;  Laterality: Left;  . APPLICATION OF WOUND VAC Left 03/08/2018   Procedure: APPLICATION OF WOUND VAC;  Surgeon: Katha Cabal, MD;  Location: [facility];  Service: Vascular;  Laterality: Left;  . BACK SURGERY  2013   Spinal fusion with rods and screws  . CHOLECYSTECTOMY    . COLONOSCOPY WITH PROPOFOL N/A 01/29/2017   Procedure: COLONOSCOPY WITH PROPOFOL;  Surgeon: Manya Silvas, MD;  Location: [facility] ENDOSCOPY;  Service: Endoscopy;  Laterality: N/A;  . ESOPHAGOGASTRODUODENOSCOPY (EGD) WITH PROPOFOL N/A 01/29/2017   Procedure: ESOPHAGOGASTRODUODENOSCOPY (EGD) WITH PROPOFOL;  Surgeon: Manya Silvas, MD;  Location: [facility] END[facility]COPY;  Service: Endoscopy;  Laterality: N/A;  . EYE SURGERY Bilateral 2012   Cataract Extraction with IOL  . KNEE ARTHROSCOPY Left 2009   partial lateral meniscectomy, debridement, excision of  Baker's cyst  . LEFT HEART CATH AND CORONARY ANGIOGRAPHY N/A 04/23/2017   Procedure: LEFT HEART CATH AND CORONARY ANGIOGRAPHY;  Surgeon: Teodoro Spray, MD;  Location: [place] [facility] LAB;  Service: Cardiovascular;  Laterality: N/A;  . LEFT HEART CATH AND CORONARY ANGIOGRAPHY Left 09/07/2017   Procedure: LEFT HEART CATH AND CORONARY ANGIOGRAPHY;  Surgeon:  Teodoro Spray, MD;  Location: [place] [facility];  Service: Cardiovascular;  Laterality: Left;  . LOWER EXTREMITY ANGIOGRAPHY Left 11/27/2017   Procedure: LOWER EXTREMITY ANGIOGRAPHY;  Surgeon: Katha Cabal, MD;  Location: [place] [facility];  Service: Cardiovascular;  Laterality: Left;  . LOWER EXTREMITY ANGIOGRAPHY Left 12/05/2017   Procedure: LOWER EXTREMITY ANGIOGRAPHY;  Surgeon: Katha Cabal, MD;  Location: [place] [facility];  Service: Cardiovascular;  Laterality: Left;  . LOWER EXTREMITY ANGIOGRAPHY Right 06/21/2018   Procedure: LOWER EXTREMITY ANGIOGRAPHY;  Surgeon: Katha Cabal, MD;  Location: [place] [facility];  Service: Cardiovascular;  Laterality: Right;  . LOWER EXTREMITY ANGIOGRAPHY Right 06/26/2018   Procedure: LOWER EXTREMITY ANGIOGRAPHY;  Surgeon: Katha Cabal, MD;  Location: [place] [facility];  Service: Cardiovascular;  Laterality: Right;  . LOWER EXTREMITY ANGIOGRAPHY Right 06/28/2018   Procedure: Lower Extremity Angiography;  Surgeon: Katha Cabal, MD;  Location: [place] [facility];  Service: Cardiovascular;  Laterality: Right;  . ROTATOR CUFF REPAIR Right 1990  . TOTAL KNEE ARTHROPLASTY Left 04/04/2016   Procedure: TOTAL KNEE ARTHROPLASTY;  Surgeon: Hessie Knows, MD;  Location: [facility];  Service: Orthopedics;  Laterality: Left;  . WOUND DEBRIDEMENT Left 01/25/2018   Procedure: DEBRIDEMENT WOUND;  Surgeon: Katha Cabal, MD;  Location: [facility];  Service: Vascular;  Laterality: Left;  . WOUND DEBRIDEMENT Left 03/08/2018   Procedure: DEBRIDEMENT WOUND;  Surgeon: Katha Cabal, MD;  Location: [facility];  Service: Vascular;  Laterality: Left;    Family History  Problem Relation Age of Onset  . CAD Mother   . CAD Father   . Alzheimer's disease Sister   . CAD Sister   . CAD Brother   . Breast cancer Neg Hx     Social History:  reports that she has never smoked. She has never used smokeless tobacco. She reports that she  does not drink alcohol or use drugs.  Allergies:  Allergies  Allergen Reactions  . Lodine [Etodolac] Hives and Itching  . Methotrexate Derivatives Other (See Comments)    Flu like symptoms  . Nitrofuran Derivatives Nausea And Vomiting and Other (See Comments)    Made sicker   . Levaquin [Levofloxacin] Itching, Rash and Other (See Comments)    Muscle pain  . Norvasc [Amlodipine] Other (See Comments)    Unknown   . Pentasa [Mesalamine Er] Other (See Comments)    Unknown   . Sulfa Antibiotics Itching and Rash  . Vioxx [Rofecoxib] Other (See Comments)    Unknown     Medications: I have reviewed the patient's current medications.  Results for orders placed or performed during the hospital encounter of 07/08/2018 (from the past 48 hour(s))  Comprehensive metabolic panel     Status: Abnormal   Collection Time: 06/22/2018 12:52 PM  Result Value Ref Range   Sodium 135 135 - 145 mmol/L   Potassium 4.9 3.5 - 5.1 mmol/L   Chloride 102 98 - 111 mmol/L   CO2 17 (L) 22 - 32 mmol/L   Glucose, Bld 136 (H) 70 - 99 mg/dL   BUN 110 (H) 8 - 23  mg/dL    Comment: RESULT CONFIRMED BY MANUAL DILUTION DAS   Creatinine, Ser 2.58 (H) 0.44 - 1.00 mg/dL   Calcium 8.1 (L) 8.9 - 10.3 mg/dL   Total Protein 6.5 6.5 - 8.1 g/dL   Albumin 2.7 (L) 3.5 - 5.0 g/dL   AST 74 (H) 15 - 41 U/L   ALT 52 (H) 0 - 44 U/L   Alkaline Phosphatase 81 38 - 126 U/L   Total Bilirubin 0.6 0.3 - 1.2 mg/dL   GFR calc non Af Amer 17 (L) >60 mL/min   GFR calc Af Amer 20 (L) >60 mL/min   Anion gap 16 (H) 5 - 15    Comment: Performed at [facility], [address] [place], [place] [address]  CBC WITH DIFFERENTIAL     Status: Abnormal   Collection Time: 07/05/2018 12:52 PM  Result Value Ref Range   WBC 28.9 (H) 4.0 - 10.5 K/uL   RBC 3.01 (L) 3.87 - 5.11 MIL/uL   Hemoglobin 9.0 (L) 12.0 - 15.0 g/dL   HCT 28.6 (L) 36.0 - 46.0 %   MCV 95.0 80.0 - 100.0 fL   MCH 29.9 26.0 - 34.0 pg   MCHC 31.5 30.0 - 36.0 g/dL   RDW 14.6  11.5 - 15.5 %   Platelets 384 150 - 400 K/uL   nRBC 0.0 0.0 - 0.2 %   Neutrophils Relative % 89 %   Neutro Abs 25.8 (H) 1.7 - 7.7 K/uL   Lymphocytes Relative 1 %   Lymphs Abs 0.3 (L) 0.7 - 4.0 K/uL   Monocytes Relative 8 %   Monocytes Absolute 2.2 (H) 0.1 - 1.0 K/uL   Eosinophils Relative 0 %   Eosinophils Absolute 0.1 0.0 - 0.5 K/uL   Basophils Relative 0 %   Basophils Absolute 0.1 0.0 - 0.1 K/uL   WBC Morphology MORPHOLOGY UNREMARKABLE    RBC Morphology MORPHOLOGY UNREMARKABLE    Smear Review Normal platelet morphology    Immature Granulocytes 2 %   Abs Immature Granulocytes 0.42 (H) 0.00 - 0.07 K/uL    Comment: Performed at [facility], [address] [place], [place] [address]  Troponin I - ONCE - STAT     Status: Abnormal   Collection Time: 06/16/2018 12:52 PM  Result Value Ref Range   Troponin I 0.09 (HH) <0.03 ng/mL    Comment: CRITICAL RESULT CALLED TO, READ BACK BY AND VERIFIED WITH [facility] WEAVER AT 9379 07/01/2018 DAS Performed at [facility], [address] [place], [place] [address]   Influenza panel by PCR (type A & B)     Status: None   Collection Time: 06/28/2018 12:52 PM  Result Value Ref Range   Influenza A By PCR NEGATIVE NEGATIVE   Influenza B By PCR NEGATIVE NEGATIVE    Comment: (NOTE) The Xpert Xpress Flu assay is intended as an aid in the diagnosis of  influenza and should not be used as a sole basis for treatment.  This  assay is FDA approved for nasopharyngeal swab specimens only. Nasal  washings and aspirates are unacceptable for Xpert Xpress Flu testing. Performed at [facility], [place], [place] [address]   CG4 I-STAT (Lactic acid)     Status: None   Collection Time: 06/24/2018  1:00 PM  Result Value Ref Range   Lactic Acid, Venous 1.07 0.5 - 1.9 mmol/L  Urinalysis, Routine w reflex microscopic     Status: Abnormal   Collection Time: 06/17/2018  1:16 PM  Result Value Ref Range   Color, Urine AMBER (A)  YELLOW    Comment: BIOCHEMICALS MAY BE AFFECTED BY COLOR   APPearance CLEAR (A) CLEAR   Specific Gravity, Urine 1.017 1.005 - 1.030   pH 5.0 5.0 - 8.0   Glucose, UA NEGATIVE NEGATIVE mg/dL   Hgb urine dipstick NEGATIVE NEGATIVE   Bilirubin Urine NEGATIVE NEGATIVE   Ketones, ur NEGATIVE NEGATIVE mg/dL   Protein, ur NEGATIVE NEGATIVE mg/dL   Nitrite NEGATIVE NEGATIVE   Leukocytes, UA NEGATIVE NEGATIVE    Comment: Performed at [facility], [address] [place], [place] [address]    Dg Knee 2 Views Right  Result Date: 06/16/2018 CLINICAL DATA:  Right below-knee amputation stump infection. EXAM: RIGHT KNEE - 1-2 VIEW COMPARISON:  None. FINDINGS: Status post right below-knee amputation. Vascular calcifications are noted no lytic defect is seen. However, small amount of gas is noted in the distal soft tissues of the stump suggesting infection. IMPRESSION: Small amount of soft tissue gas seen in distal portion of BKA stump suggesting infection. Electronically Signed   By: Marijo Conception, M.D.   On: 06/23/2018 13:31   Dg Chest Portable 1 View  Result Date: 06/28/2018 CLINICAL DATA:  ET tube insertion EXAM: PORTABLE CHEST 1 VIEW COMPARISON:  06/30/2018 FINDINGS: Endotracheal tube with the tip 3.3 cm above the carina. Right jugular central venous catheter with the tip projecting over the SVC. Chronic elevation of the right diaphragm. Mild hazy left basilar airspace disease likely reflecting atelectasis. No pleural effusion or pneumothorax. Stable cardiomediastinal silhouette. No aggressive osseous lesion. IMPRESSION: Endotracheal tube with the tip 3.3 cm above the carina. Right jugular central venous catheter with the tip projecting over the SVC. Electronically Signed   By: Kathreen Devoid   On: 06/18/2018 16:10   Dg Chest Portable 1 View  Result Date: 06/29/2018 CLINICAL DATA:  Central line placement EXAM: PORTABLE CHEST 1 VIEW COMPARISON:  07/04/2018 FINDINGS: Right central line is in  place with the tip at the cavoatrial junction. No pneumothorax. Bibasilar atelectasis. Mild vascular congestion. Suspect small effusions. No acute bony abnormality. IMPRESSION: Right central line tip at the cavoatrial junction.  No pneumothorax. Vascular congestion, bibasilar atelectasis and possible small effusions. Electronically Signed   By: Rolm Baptise M.D.   On: 06/17/2018 15:07   Dg Chest Port 1 View  Result Date: 06/29/2018 CLINICAL DATA:  Recent right leg amputation.  Possible sepsis. EXAM: PORTABLE CHEST 1 VIEW COMPARISON:  07/04/2018 FINDINGS: Heart is normal size. Bibasilar atelectasis or infiltrates noted. Mild elevation of the right hemidiaphragm. No visible effusions or acute bony abnormality. IMPRESSION: Bibasilar atelectasis or infiltrates. Mild elevation of the right hemidiaphragm. Electronically Signed   By: Rolm Baptise M.D.   On: 06/30/2018 13:24    ROS  Unable to obtain at this time as patient is unresponsive Blood pressure 92/60, pulse (!) 136, temperature 100.3 F (37.9 C), temperature source Rectal, resp. rate 20, height 5' 4"  (1.626 m), weight 48.9 kg, SpO2 100 %. Physical Exam Vital signs: Please see the above listed vital signs Patient is unresponsive, on mechanical ventilation, norepinephrine, tachycardic with a pulse of 140 and marginal blood pressures HEENT: Patient is orally intubated, trachea midline, no thyromegaly appreciated, right internal jugular central line has been placed Cardiovascular: Tachycardia, appears to be sinus tachycardia with a rate of 1 30-1 40 Pulmonary: Rhonchi appreciated diffusely Abdominal: Positive bowel sounds, soft exam Extremities: Fluctuance noted in the right BKA with some demarcation and  skin with foul-smelling drainage Neurologic: Patient is on sedation status post intubation limited exam at this time  Assessment/Plan:  Septic shock.  Most likely source is right BKA site, has been placed on cefepime, vancomycin, norepinephrine,  fluid resuscitation and empiric hydrocortisone along with vasopressin.  Secondary to tachycardia will convert norepinephrine to Neo-Synephrine and continue vasopressin.  Acid-base derangement.  Anion gap of 16, delta gap of 4, calculated starting bicarb of 21 consistent with both anion gap and non-anion gap metabolic acidosis.  Arterial blood gas not obtained yet for complete interpretation  Leukocytosis.  Secondary to sepsis  Anemia.  No clear evidence of active bleeding  Elevated troponin at 0.09.  Most likely reflect supply demand mismatch and secondary to hypotension.  Pending EKG, will cycle cardiac enzymes.  Echo performed in June revealed mild reduction in systolic function with ejection fraction of 45 to 50% along with valvular heart disease, mitral regurgitation noted  Critical care time 30 minutes Marna Weniger 06/16/2018, 5:02 PM

## 2018-07-15 NOTE — ED Notes (Signed)
Blood pressure dropped when patient's torso was raised to placed x-ray film in place. Dr. Kerman Passey aware.

## 2018-07-15 NOTE — ED Notes (Signed)
Levophed started at 50mg per Dr. PKerman Passey

## 2018-07-15 NOTE — ED Notes (Signed)
Right surgical site dressed with telfa, abd pad and wrapped with Kling.

## 2018-07-15 NOTE — Consult Note (Signed)
Pharmacy Antibiotic Note  Melissa [name] is a 77 y.o. female admitted on 06/19/2018 with sepsis.  Pharmacy has been consulted for Vancomycin/Cefepime dosing.  Plan: Cefepime 1g q12h (renally adjusted from Cefepime 2g q8h)  Vancomycin 750 IV every 48 hours.  Goal trough 15-20 mcg/mL.  Will check trough prior to 4th scheduled dose (1/6 2330)  Pt received 1g Vancomycin ED 1343 12/30  Stacked dosing 34 hours - Will monitor Scr carefully and dose vancomycin sooner if renal function improves  Height: 5' 4"  (162.6 cm) Weight: 107 lb 12.9 oz (48.9 kg) IBW/kg (Calculated) : 54.7  Temp (24hrs), Avg:100.3 F (37.9 C), Min:100.3 F (37.9 C), Max:100.3 F (37.9 C)  Recent Labs  Lab 07/10/18 0845 06/23/2018 1252 06/20/2018 1300  WBC 17.1* 28.9*  --   CREATININE 0.75 2.58*  --   LATICACIDVEN  --   --  1.07    Estimated Creatinine Clearance: 14.1 mL/min (A) (by C-G formula based on SCr of 2.58 mg/dL (H)).    Allergies  Allergen Reactions  . Lodine [Etodolac] Hives and Itching  . Methotrexate Derivatives Other (See Comments)    Flu like symptoms  . Nitrofuran Derivatives Nausea And Vomiting and Other (See Comments)    Made sicker   . Levaquin [Levofloxacin] Itching, Rash and Other (See Comments)    Muscle pain  . Norvasc [Amlodipine] Other (See Comments)    Unknown   . Pentasa [Mesalamine Er] Other (See Comments)    Unknown   . Sulfa Antibiotics Itching and Rash  . Vioxx [Rofecoxib] Other (See Comments)    Unknown     Antimicrobials this admission: Vancomycin 12/30 >>   Cefepime 12/30 >>  Flagyl 12/30 >>  Dose adjustments this admission: N/A  Microbiology results: 12/30 BCx: pending 12/30 UCx: pending    Thank you for allowing pharmacy to be a part of this patient's care.  Lu Duffel, PharmD, BCPS Clinical Pharmacist 06/20/2018 2:21 PM

## 2018-07-15 NOTE — ED Triage Notes (Signed)
Per EMS report, patient is at [facility] in [place] after a right BKA last week. Patient's family states the patient has been altered all weekend. EMTs report strong odor of urine. Patient Right amputation is covered with clean bandage.Per EMT, patient was last given Oxycodone at 0100 last night. Patient has eyes closed, but will squeeze staff's hands and open eyes and look at staff upon command.

## 2018-07-15 NOTE — ED Notes (Signed)
Dr. Kerman Passey at bedside to verify CL placement by chest x-ray.

## 2018-07-15 NOTE — ED Notes (Signed)
Dr. Kerman Passey intubated patient with 7.0 stylet, 21 at the lip with positive color change. Patient placed on vent, 5 Peep, 30%, rate 16.

## 2018-07-15 NOTE — Anesthesia Preprocedure Evaluation (Addendum)
Anesthesia Evaluation  Patient identified by MRN, date of birth, ID band Patient awake    Reviewed: Allergy & Precautions, H&P , NPO status , Patient's Chart, lab work & pertinent test results, reviewed documented beta blocker date and time   History of Anesthesia Complications Negative for: history of anesthetic complications  Airway Mallampati: Intubated       Dental  (+) Teeth Intact   Pulmonary neg pulmonary ROS, shortness of breath, COPD,           Cardiovascular Exercise Tolerance: Poor hypertension, Pt. on medications + CAD and + Past MI  negative cardio ROS  + dysrhythmias      Neuro/Psych PSYCHIATRIC DISORDERS Anxiety Depression negative neurological ROS  negative psych ROS   GI/Hepatic negative GI ROS, Neg liver ROS, GERD  ,  Endo/Other  negative endocrine ROS  Renal/GU negative Renal ROS  negative genitourinary   Musculoskeletal  (+) Arthritis ,   Abdominal   Peds  Hematology  (+) Blood dyscrasia, anemia ,   Anesthesia Other Findings Past Medical History: 12/2017: Anemia     Comment:  blood transfusion after amputation No date: Anxiety No date: Arthritis     Comment:  rheumatoid No date: Ataxia 2019: Chronic ulcer of right great toe ([facility])     Comment:  slowly healing No date: COPD (chronic obstructive pulmonary disease) ([place]) 2018: Coronary artery disease     Comment:  complete blockage in one vessel, 40 % in another, 25% in              3rd vessell No date: Depression No date: Diverticulosis No date: Dysrhythmia     Comment:  atrial fib No date: Excessive falling No date: GERD (gastroesophageal reflux disease)     Comment:  ulcerative colitis No date: Hypercholesteremia No date: Hyperlipidemia No date: Hypertension No date: IBS (irritable bowel syndrome) 11/2017: MRSA (methicillin resistant staph aureus) culture positive     Comment:  patient unaware of this diagnosis, was never  informed or              treated 08/2017: Myocardial infarction [facility])     Comment:  had angio but no stents No date: Osteoarthritis No date: Psoriasis No date: Raynaud's disease without gangrene No date: Raynaud's disease without gangrene No date: Shortness of breath dyspnea     Comment:  with exertion 12/2017: Transfusion history     Comment:  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)   Reproductive/Obstetrics negative OB ROS                            Anesthesia Physical  Anesthesia Plan  ASA: IV  Anesthesia Plan: General   Post-op Pain Management:    Induction:   PONV Risk Score and Plan: 3 and Treatment may vary due to age or medical condition  Airway Management Planned: Oral ETT  Additional Equipment:   Intra-op Plan:   Post-operative Plan: Post-operative intubation/ventilation  Informed Consent: I have reviewed the patients History and Physical, chart, labs and discussed the procedure including the risks, benefits and alternatives for the proposed anesthesia with the patient or authorized representative who has indicated his/her understanding and acceptance.     Plan Discussed with: CRNA  Anesthesia Plan Comments:        Anesthesia Quick Evaluation

## 2018-07-15 NOTE — H&P (Signed)
[facility] NAME: Melissa [name]    MR#:  [id number]  DATE OF BIRTH:  July 22, 1940  DATE OF ADMISSION:  07/02/2018  PRIMARY CARE PHYSICIAN: [name], MD   REQUESTING/REFERRING PHYSICIAN: Dr Harvest Dark  CHIEF COMPLAINT:   Chief Complaint  Patient presents with  . Altered Mental Status    HISTORY OF PRESENT ILLNESS:  Melissa [name]  is a 77 y.o. female with a known history of recent right BKA.  She was out at rehab.  She was sent back in for altered mental status.  History obtained from old chart and family at the bedside.  Patient barely responsive to sternal rub.  Patient was found to have septic shock and placed on levo fed and given 2 L of IV fluid and blood pressure still in the 80s.  Hospitalist services were contacted for further evaluation.  ER physician spoke with the vascular team and they plan on potentially doing surgery this afternoon.  PAST MEDICAL HISTORY:   Past Medical History:  Diagnosis Date  . Anemia 12/2017   blood transfusion after amputation  . Anxiety   . Arthritis    rheumatoid  . Ataxia   . Chronic ulcer of right great toe ([place]) 2019   slowly healing  . COPD (chronic obstructive pulmonary disease) ([place])   . Coronary artery disease 2018   complete blockage in one vessel, 40 % in another, 25% in 3rd vessell  . Depression   . Diverticulosis   . Dysrhythmia    atrial fib  . Excessive falling   . GERD (gastroesophageal reflux disease)    ulcerative colitis  . Hypercholesteremia   . Hyperlipidemia   . Hypertension   . IBS (irritable bowel syndrome)   . MRSA (methicillin resistant staph aureus) culture positive 11/2017   patient unaware of this diagnosis, was never informed or treated  . Myocardial infarction ([place]) 08/2017   had angio but no stents  . Osteoarthritis   . Psoriasis   . Raynaud's disease without gangrene   . Raynaud's disease without gangrene   . Shortness of  breath dyspnea    with exertion  . Transfusion history 12/2017   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)    PAST SURGICAL HISTORY:   Past Surgical History:  Procedure Laterality Date  . ABDOMINAL HYSTERECTOMY  1980  . AMPUTATION Left 12/12/2017   Procedure: AMPUTATION BELOW KNEE;  Surgeon: Katha Cabal, MD;  Location: [facility] ORS;  Service: Vascular;  Laterality: Left;  . AMPUTATION Left 02/08/2018   Procedure: AMPUTATION BELOW KNEE ( REVISION );  Surgeon: Katha Cabal, MD;  Location: [facility];  Service: Vascular;  Laterality: Left;  . AMPUTATION Right 07/01/2018   Procedure: AMPUTATION BELOW KNEE;  Surgeon: Katha Cabal, MD;  Location: [facility];  Service: Vascular;  Laterality: Right;  . APPENDECTOMY  1956  . APPLICATION OF WOUND VAC Left 01/25/2018   Procedure: APPLICATION OF WOUND VAC;  Surgeon: Katha Cabal, MD;  Location: [facility];  Service: Vascular;  Laterality: Left;  . APPLICATION OF WOUND VAC Left 03/08/2018   Procedure: APPLICATION OF WOUND VAC;  Surgeon: Katha Cabal, MD;  Location: [facility];  Service: Vascular;  Laterality: Left;  . BACK SURGERY  2013   Spinal fusion with rods and screws  . CHOLECYSTECTOMY    . COLONOSCOPY WITH PROPOFOL N/A 01/29/2017   Procedure: COLONOSCOPY WITH PROPOFOL;  Surgeon: Manya Silvas, MD;  Location[facility] END[facility]COPY;  Service: Endoscopy;  Laterality: N/A;  . ESOPHAGOGASTRODUODENOSCOPY (EGD) WITH PROPOFOL N/A 01/29/2017   Procedure: ESOPHAGOGASTRODUODENOSCOPY (EGD) WITH PROPOFOL;  Surgeon: Manya Silvas, MD;  Location: [facility] ENDOSCOPY;  Service: Endoscopy;  Laterality: N/A;  . EYE SURGERY Bilateral 2012   Cataract Extraction with IOL  . KNEE ARTHROSCOPY Left 2009   partial lateral meniscectomy, debridement, excision of Baker's cyst  . LEFT HEART CATH AND CORONARY ANGIOGRAPHY N/A 04/23/2017   Procedure: LEFT HEART CATH AND CORONARY ANGIOGRAPHY;  Surgeon: Teodoro Spray, MD;  Location: [place] CV LAB;  Service:  Cardiovascular;  Laterality: N/A;  . LEFT HEART CATH AND CORONARY ANGIOGRAPHY Left 09/07/2017   Procedure: LEFT HEART CATH AND CORONARY ANGIOGRAPHY;  Surgeon: Teodoro Spray, MD;  Location: [place] CV LAB;  Service: Cardiovascular;  Laterality: Left;  . LOWER EXTREMITY ANGIOGRAPHY Left 11/27/2017   Procedure: LOWER EXTREMITY ANGIOGRAPHY;  Surgeon: Katha Cabal, MD;  Location: [place] CV LAB;  Service: Cardiovascular;  Laterality: Left;  . LOWER EXTREMITY ANGIOGRAPHY Left 12/05/2017   Procedure: LOWER EXTREMITY ANGIOGRAPHY;  Surgeon: Katha Cabal, MD;  Location: [place] CV LAB;  Service: Cardiovascular;  Laterality: Left;  . LOWER EXTREMITY ANGIOGRAPHY Right 06/21/2018   Procedure: LOWER EXTREMITY ANGIOGRAPHY;  Surgeon: Katha Cabal, MD;  Location: [place] [facility];  Service: Cardiovascular;  Laterality: Right;  . LOWER EXTREMITY ANGIOGRAPHY Right 06/26/2018   Procedure: LOWER EXTREMITY ANGIOGRAPHY;  Surgeon: Katha Cabal, MD;  Location: [place] CV LAB;  Service: Cardiovascular;  Laterality: Right;  . LOWER EXTREMITY ANGIOGRAPHY Right 06/28/2018   Procedure: Lower Extremity Angiography;  Surgeon: Katha Cabal, MD;  Location: [place] [facility] LAB;  Service: Cardiovascular;  Laterality: Right;  . ROTATOR CUFF REPAIR Right 1990  . TOTAL KNEE ARTHROPLASTY Left 04/04/2016   Procedure: TOTAL KNEE ARTHROPLASTY;  Surgeon: Hessie Knows, MD;  Location: [facility] ORS;  Service: Orthopedics;  Laterality: Left;  . WOUND DEBRIDEMENT Left 01/25/2018   Procedure: DEBRIDEMENT WOUND;  Surgeon: Katha Cabal, MD;  Location: [facility] ORS;  Service: Vascular;  Laterality: Left;  . WOUND DEBRIDEMENT Left 03/08/2018   Procedure: DEBRIDEMENT WOUND;  Surgeon: Katha Cabal, MD;  Location: [facility] ORS;  Service: Vascular;  Laterality: Left;    SOCIAL HISTORY:   Social History   Tobacco Use  . Smoking status: Never Smoker  . Smokeless tobacco: Never Used  Substance  Use Topics  . Alcohol use: No    FAMILY HISTORY:   Family History  Problem Relation Age of Onset  . CAD Mother   . CAD Father   . Alzheimer's disease Sister   . CAD Sister   . CAD Brother   . Breast cancer Neg Hx     DRUG ALLERGIES:   Allergies  Allergen Reactions  . Lodine [Etodolac] Hives and Itching  . Methotrexate Derivatives Other (See Comments)    Flu like symptoms  . Nitrofuran Derivatives Nausea And Vomiting and Other (See Comments)    Made sicker   . Levaquin [Levofloxacin] Itching, Rash and Other (See Comments)    Muscle pain  . Norvasc [Amlodipine] Other (See Comments)    Unknown   . Pentasa [Mesalamine Er] Other (See Comments)    Unknown   . Sulfa Antibiotics Itching and Rash  . Vioxx [Rofecoxib] Other (See Comments)    Unknown     REVIEW OF SYSTEMS:   Patient unable to provide review of systems secondary to altered mental status.   MEDICATIONS AT HOME:  Prior to Admission medications   Medication Sig Start Date End Date Taking? Authorizing Provider  acetaminophen (TYLENOL) 500 MG tablet Take 1,000 mg by mouth every 8 (eight) hours as needed for mild pain or headache.   Yes [provider]  apixaban (ELIQUIS) 5 MG TABS tablet Take 1 tablet (5 mg total) by mouth 2 (two) times daily. 04/24/17  Yes Hillary Bow, MD  aspirin EC 81 MG tablet Take 1 tablet (81 mg total) by mouth daily. 04/24/17  Yes Sudini, Alveta Heimlich, MD  budesonide (ENTOCORT EC) 3 MG 24 hr capsule Take 3 capsules (9 mg total) by mouth daily. Taper down to 3 mg Patient taking differently: Take 9 mg by mouth daily.  12/19/17  Yes Schnier, Dolores Lory, MD  Calcium Carbonate-Vitamin D (CALCIUM 600+D) 600-400 MG-UNIT tablet Take 1 tablet by mouth 2 (two) times daily.    Yes [provider]  clopidogrel (PLAVIX) 75 MG tablet TAKE 1 TABLET BY MOUTH EVERY DAY Patient taking differently: Take 75 mg by mouth daily.  02/22/18  Yes Schnier, Dolores Lory, MD  diclofenac sodium (VOLTAREN) 1 % GEL  Apply 2 g topically 2 (two) times daily as needed (for arthritis in hands).  05/22/18  Yes [provider]  diltiazem (CARDIZEM CD) 240 MG 24 hr capsule Take 240 mg by mouth daily.   Yes [provider]  ferrous sulfate 325 (65 FE) MG tablet Take 325 mg by mouth daily with breakfast.   Yes [provider]  gabapentin (NEURONTIN) 300 MG capsule Take 1 capsule (300 mg total) by mouth 3 (three) times daily. Patient taking differently: Take 300 mg by mouth 4 (four) times daily.  07/09/18  Yes Salary, Montell D, MD  gemfibrozil (LOPID) 600 MG tablet Take 600 mg by mouth 2 (two) times daily before a meal.   Yes [provider]  hydroxychloroquine (PLAQUENIL) 200 MG tablet Take 200 mg by mouth 2 (two) times daily.    Yes [provider]  leflunomide (ARAVA) 20 MG tablet Take 20 mg by mouth daily.   Yes [provider]  lisinopril (PRINIVIL,ZESTRIL) 5 MG tablet Take 5 mg by mouth daily.   Yes [provider]  LORazepam (ATIVAN) 1 MG tablet Take 1 tablet (1 mg total) by mouth at bedtime. Patient taking differently: Take 1 mg by mouth 3 (three) times daily.  07/09/18  Yes Salary, [name], MD  Multiple Vitamin (MULTIVITAMIN WITH MINERALS) TABS tablet Take 1 tablet by mouth daily.   Yes [provider]  Multiple Vitamins-Minerals (PRESERVISION AREDS) CAPS Take 1 capsule by mouth 2 (two) times daily.   Yes [provider]  mupirocin ointment (BACTROBAN) 2 % Apply to affected area 2 times daily 06/22/18 06/22/19 Yes Pharr, Altamease Oiler, MD  Nutritional Supplements (NUTRITIONAL SHAKE HIGH PROTEIN) LIQD Take 237 mLs by mouth daily. Slim Fast protein   Yes [provider]  omeprazole (PRILOSEC) 20 MG capsule Take 20 mg by mouth 2 (two) times daily before a meal.    Yes [provider]  oxyCODONE (OXY IR/ROXICODONE) 5 MG immediate release tablet Take 5 mg by mouth every 4 (four) hours as needed for severe pain.   Yes  [provider]  pravastatin (PRAVACHOL) 80 MG tablet Take 80 mg by mouth at bedtime.    Yes [provider]  predniSONE (DELTASONE) 5 MG tablet Take 5 mg by mouth daily with breakfast.    Yes [provider]  risperiDONE (RISPERDAL) 1 MG tablet Take 1  tablet (1 mg total) by mouth at bedtime. 07/09/18  Yes Salary, [name], MD  sertraline (ZOLOFT) 100 MG tablet Take 100 mg by mouth daily.   Yes [provider]  vitamin C (ASCORBIC ACID) 250 MG tablet Take 500 mg by mouth 2 (two) times daily.   Yes [provider]  feeding supplement, ENSURE ENLIVE, (ENSURE ENLIVE) LIQD Take 237 mLs by mouth 2 (two) times daily between meals. 02/12/18   Stegmayer, Joelene Millin A, PA-C  gabapentin (NEURONTIN) 300 MG capsule Take 1 capsule (300 mg total) by mouth at bedtime. Patient not taking: Reported on 06/18/2018 07/11/18   Salary, [name], MD  HYDROcodone-acetaminophen (NORCO) 5-325 MG tablet Take 1-2 tablets by mouth every 6 (six) hours as needed for moderate pain or severe pain. Patient not taking: Reported on 07/03/2018 07/09/18   Salary, [name], MD  traMADol (ULTRAM) 50 MG tablet Take 1 tablet (50 mg total) by mouth every 6 (six) hours as needed for moderate pain. 07/09/18   Salary, [name], MD      VITAL SIGNS:  Blood pressure 102/62, pulse (!) 129, temperature 100.3 F (37.9 C), temperature source Rectal, resp. rate 17, height 5' 4"  (1.626 m), weight 48.9 kg, SpO2 98 %.  PHYSICAL EXAMINATION:  GENERAL:  77 y.o.-year-old patient lying in the bed looking ill and occasional apneic breathing EYES: Pupils equal, round, reactive to light and accommodation. No scleral icterus.  HEENT: Head atraumatic, normocephalic.  Unable to look into the mouth NECK:  Supple, no jugular venous distention. No thyroid enlargement, no tenderness.  LUNGS: Decreased breath sounds bilaterally, no wheezing, rales,rhonchi or crepitation. No use of accessory muscles of respiration.   Periods of apnea. CARDIOVASCULAR: S1, S2 tachycardic. No murmurs, rubs, or gallops.  ABDOMEN: Soft, nontender, nondistended. Bowel sounds present. No organomegaly or mass.  EXTREMITIES: Bilateral BKA.  Left side looks well.  Right side is infected. NEUROLOGIC: Barely responsive to sternal rub PSYCHIATRIC: The patient is barely responsive to sternal rub.  SKIN: Right stump with blackish material and erythema  LABORATORY PANEL:   CBC Recent Labs  Lab 07/14/2018 1252  WBC 28.9*  HGB 9.0*  HCT 28.6*  PLT 384   ------------------------------------------------------------------------------------------------------------------  Chemistries  Recent Labs  Lab 06/22/2018 1252  NA 135  K 4.9  CL 102  CO2 17*  GLUCOSE 136*  BUN 110*  CREATININE 2.58*  CALCIUM 8.1*  AST 74*  ALT 52*  ALKPHOS 81  BILITOT 0.6   ------------------------------------------------------------------------------------------------------------------  Cardiac Enzymes Recent Labs  Lab 07/05/2018 1252  TROPONINI 0.09*   ------------------------------------------------------------------------------------------------------------------  RADIOLOGY:  Dg Knee 2 Views Right  Result Date: 07/03/2018 CLINICAL DATA:  Right below-knee amputation stump infection. EXAM: RIGHT KNEE - 1-2 VIEW COMPARISON:  None. FINDINGS: Status post right below-knee amputation. Vascular calcifications are noted no lytic defect is seen. However, small amount of gas is noted in the distal soft tissues of the stump suggesting infection. IMPRESSION: Small amount of soft tissue gas seen in distal portion of BKA stump suggesting infection. Electronically Signed   By: Marijo Conception, M.D.   On: 06/17/2018 13:31   Dg Chest Portable 1 View  Result Date: 07/09/2018 CLINICAL DATA:  Central line placement EXAM: PORTABLE CHEST 1 VIEW COMPARISON:  07/01/2018 FINDINGS: Right central line is in place with the tip at the cavoatrial junction. No  pneumothorax. Bibasilar atelectasis. Mild vascular congestion. Suspect small effusions. No acute bony abnormality. IMPRESSION: Right central line tip at the cavoatrial junction.  No pneumothorax. Vascular congestion,  bibasilar atelectasis and possible small effusions. Electronically Signed   By: Rolm Baptise M.D.   On: 07/13/2018 15:07   Dg Chest Port 1 View  Result Date: 07/09/2018 CLINICAL DATA:  Recent right leg amputation.  Possible sepsis. EXAM: PORTABLE CHEST 1 VIEW COMPARISON:  07/04/2018 FINDINGS: Heart is normal size. Bibasilar atelectasis or infiltrates noted. Mild elevation of the right hemidiaphragm. No visible effusions or acute bony abnormality. IMPRESSION: Bibasilar atelectasis or infiltrates. Mild elevation of the right hemidiaphragm. Electronically Signed   By: Rolm Baptise M.D.   On: 06/20/2018 13:24    EKG:   Ordered by me  IMPRESSION AND PLAN:   1.  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  Follow-up cultures.  Aggressive antibiotics with vancomycin, cefepime and Flagyl.  Patient already received 2 L of IV fluid.  Starting on third liter.  Started on Levophed and brought up to 7 mcg.  Patient will need stabilization prior to AKA. 2.  Acute encephalopathy with periods of apnea.  Case discussed with Dr. Mabeline Caras asking to go back and intubate the patient.  Case discussed with critical care specialist. 3.  Acute kidney injury.  IV fluid hydration. 4.  Patient is on chronic prednisone.  Since the patient is in septic shock I will put on stress dose Solu-Cortef. 5.  Anemia of chronic disease.  Watch with IV fluids.  May end up needing transfusion during the hospital course. 6.  Peripheral vascular disease and amputations 7.  Paroxysmal atrial fibrillation holding anticoagulation and rate controlling meds at this point with septic shock 8.  Moderate malnutrition  All the records are reviewed and case discussed with ED provider. Management plans discussed with the patient, family and  they are in agreement.  CODE STATUS: Full code  TOTAL TIME TAKING CARE OF THIS PATIENT: 50 minutes, patient is critically ill and high risk for cardiovascular events.Loletha Grayer M.D on 07/12/2018 at 3:36 PM  Between 7am to 6pm - Pager - [phone]  After 6pm call admission pager [phone]  [facility]  [phone]  CC: Primary care physician; [name], MD

## 2018-07-15 NOTE — Progress Notes (Signed)
Pt transported to ICU on vent with RN without incident. Vent plugged into red outlet and oxygen/Room Air tubing connected to appropriate connectors. Report given to floor RT.

## 2018-07-15 NOTE — ED Notes (Signed)
Dr. Lucky Cowboy at bedside.

## 2018-07-15 NOTE — Consult Note (Signed)
CODE SEPSIS - PHARMACY COMMUNICATION  **Broad Spectrum Antibiotics should be administered within 1 hour of Sepsis diagnosis**  Time Code Sepsis Called/Page Received: 1254  Antibiotics Ordered: Vancomycin/Cefepime/Metronidazole  Time of 1st antibiotic administration: 1332  Additional action taken by pharmacy: n/a  If necessary, Name of Provider/Nurse Contacted: Jennings ,PharmD Clinical Pharmacist  07/10/2018  12:54 PM

## 2018-07-15 NOTE — ED Notes (Addendum)
Triple Lumen central line placed on right by Dr. Kerman Passey. Patient tolerated procedure well.

## 2018-07-15 NOTE — ED Notes (Signed)
Patient's children are at bedside.

## 2018-07-15 NOTE — ED Notes (Signed)
Respiratory therapist Bambi, Dr. Kerman Passey, Mickel Baas RN and Tana Conch RN at bedside.

## 2018-07-15 NOTE — Progress Notes (Signed)
Patient well-known to our service.  She is a couple of weeks status post right below-knee amputation.  She presents today with lethargy, confusion, and hypotension with signs of sepsis.  She has fluctuance particularly on the medial aspect of her right BKA incision with some cyanotic and demarcating skin in that area as well.  It is difficult to assess tenderness due to her mental status.  Although the drainage is not a large amount at this point, it is foul-smelling.  This is likely the source of her sepsis and when she is hemodynamically stable, debridement of the right BKA stump would be recommended.  This could be later today or tomorrow.

## 2018-07-15 NOTE — ED Notes (Signed)
Dr. Kerman Passey aware of Troponin of 0.09.

## 2018-07-15 NOTE — ED Notes (Addendum)
Patient's eyes are opened, holding head up. Propofol increased to 91mg/kg/min. Dr. PKerman Passeyaware.

## 2018-07-15 NOTE — ED Notes (Signed)
Dr. Kerman Passey at bedside for central line.

## 2018-07-16 ENCOUNTER — Inpatient Hospital Stay: Payer: Medicare HMO | Admitting: Anesthesiology

## 2018-07-16 ENCOUNTER — Encounter: Admission: EM | Disposition: E | Payer: Self-pay | Source: Home / Self Care | Attending: Internal Medicine

## 2018-07-16 ENCOUNTER — Encounter: Payer: Self-pay | Admitting: Certified Registered Nurse Anesthetist

## 2018-07-16 ENCOUNTER — Inpatient Hospital Stay: Payer: Medicare HMO

## 2018-07-16 DIAGNOSIS — T8753 Necrosis of amputation stump, right lower extremity: Secondary | ICD-10-CM

## 2018-07-16 DIAGNOSIS — L8990 Pressure ulcer of unspecified site, unspecified stage: Secondary | ICD-10-CM

## 2018-07-16 HISTORY — PX: AMPUTATION: SHX166

## 2018-07-16 LAB — URINE CULTURE: Culture: NO GROWTH

## 2018-07-16 LAB — CBC WITH DIFFERENTIAL/PLATELET
Abs Immature Granulocytes: 1.76 10*3/uL — ABNORMAL HIGH (ref 0.00–0.07)
Basophils Absolute: 0.1 10*3/uL (ref 0.0–0.1)
Basophils Relative: 0 %
Eosinophils Absolute: 0.1 10*3/uL (ref 0.0–0.5)
Eosinophils Relative: 0 %
HCT: 25.8 % — ABNORMAL LOW (ref 36.0–46.0)
Hemoglobin: 8.1 g/dL — ABNORMAL LOW (ref 12.0–15.0)
Immature Granulocytes: 5 %
Lymphocytes Relative: 0 %
Lymphs Abs: 0.2 10*3/uL — ABNORMAL LOW (ref 0.7–4.0)
MCH: 31 pg (ref 26.0–34.0)
MCHC: 31.4 g/dL (ref 30.0–36.0)
MCV: 98.9 fL (ref 80.0–100.0)
MONO ABS: 1.7 10*3/uL — AB (ref 0.1–1.0)
Monocytes Relative: 4 %
NEUTROS ABS: 35.7 10*3/uL — AB (ref 1.7–7.7)
Neutrophils Relative %: 91 %
Platelets: 445 10*3/uL — ABNORMAL HIGH (ref 150–400)
RBC: 2.61 MIL/uL — ABNORMAL LOW (ref 3.87–5.11)
RDW: 15 % (ref 11.5–15.5)
WBC: 39.4 10*3/uL — ABNORMAL HIGH (ref 4.0–10.5)
nRBC: 0 % (ref 0.0–0.2)

## 2018-07-16 LAB — BLOOD CULTURE ID PANEL (REFLEXED)
Acinetobacter baumannii: NOT DETECTED
CARBAPENEM RESISTANCE: NOT DETECTED
Candida albicans: NOT DETECTED
Candida glabrata: NOT DETECTED
Candida krusei: NOT DETECTED
Candida parapsilosis: NOT DETECTED
Candida tropicalis: NOT DETECTED
Enterobacter cloacae complex: NOT DETECTED
Enterobacteriaceae species: DETECTED — AB
Enterococcus species: NOT DETECTED
Escherichia coli: NOT DETECTED
Haemophilus influenzae: NOT DETECTED
Klebsiella oxytoca: NOT DETECTED
Klebsiella pneumoniae: NOT DETECTED
Listeria monocytogenes: NOT DETECTED
Neisseria meningitidis: NOT DETECTED
PROTEUS SPECIES: DETECTED — AB
Pseudomonas aeruginosa: NOT DETECTED
SERRATIA MARCESCENS: NOT DETECTED
STAPHYLOCOCCUS AUREUS BCID: NOT DETECTED
STAPHYLOCOCCUS SPECIES: NOT DETECTED
Streptococcus agalactiae: NOT DETECTED
Streptococcus pneumoniae: NOT DETECTED
Streptococcus pyogenes: NOT DETECTED
Streptococcus species: NOT DETECTED

## 2018-07-16 LAB — COMPREHENSIVE METABOLIC PANEL
ALT: 46 U/L — AB (ref 0–44)
AST: 66 U/L — AB (ref 15–41)
Albumin: 2 g/dL — ABNORMAL LOW (ref 3.5–5.0)
Alkaline Phosphatase: 117 U/L (ref 38–126)
Anion gap: 13 (ref 5–15)
BUN: 76 mg/dL — AB (ref 8–23)
CO2: 17 mmol/L — ABNORMAL LOW (ref 22–32)
CREATININE: 1.05 mg/dL — AB (ref 0.44–1.00)
Calcium: 6.7 mg/dL — ABNORMAL LOW (ref 8.9–10.3)
Chloride: 115 mmol/L — ABNORMAL HIGH (ref 98–111)
GFR, EST AFRICAN AMERICAN: 59 mL/min — AB (ref >60)
GFR, EST NON AFRICAN AMERICAN: 51 mL/min — AB (ref >60)
Glucose, Bld: 207 mg/dL — ABNORMAL HIGH (ref 70–99)
Potassium: 4.2 mmol/L (ref 3.5–5.1)
Sodium: 145 mmol/L (ref 135–145)
Total Bilirubin: 0.9 mg/dL (ref 0.3–1.2)
Total Protein: 5.7 g/dL — ABNORMAL LOW (ref 6.5–8.1)

## 2018-07-16 LAB — GLUCOSE, CAPILLARY
Glucose-Capillary: 171 mg/dL — ABNORMAL HIGH (ref 70–99)
Glucose-Capillary: 158 mg/dL — ABNORMAL HIGH (ref 70–99)
Glucose-Capillary: 150 mg/dL — ABNORMAL HIGH (ref 70–99)
Glucose-Capillary: 177 mg/dL — ABNORMAL HIGH (ref 70–99)
Glucose-Capillary: 159 mg/dL — ABNORMAL HIGH (ref 70–99)
Glucose-Capillary: 169 mg/dL — ABNORMAL HIGH (ref 70–99)
Glucose-Capillary: 294 mg/dL — ABNORMAL HIGH (ref 70–99)

## 2018-07-16 SURGERY — AMPUTATION BELOW KNEE
Anesthesia: General | Laterality: Right

## 2018-07-16 MED ORDER — AMIODARONE HCL IN DEXTROSE 360-4.14 MG/200ML-% IV SOLN
30.0000 mg/h | INTRAVENOUS | Status: DC
  Administered 2018-07-16 – 2018-07-19 (×7): 30 mg/h via INTRAVENOUS
  Filled 2018-07-16 (×5): qty 200

## 2018-07-16 MED ORDER — SODIUM CHLORIDE 0.9 % IV SOLN
2.0000 g | Freq: Every day | INTRAVENOUS | Status: DC
  Administered 2018-07-16 – 2018-07-19 (×4): 2 g via INTRAVENOUS
  Filled 2018-07-16 (×4): qty 2

## 2018-07-16 MED ORDER — SODIUM CHLORIDE (PF) 0.9 % IJ SOLN
INTRAMUSCULAR | Status: DC | PRN
  Administered 2018-07-16: 20 mL

## 2018-07-16 MED ORDER — PANTOPRAZOLE SODIUM 40 MG IV SOLR
40.0000 mg | INTRAVENOUS | Status: DC
  Administered 2018-07-16 – 2018-07-19 (×4): 40 mg via INTRAVENOUS
  Filled 2018-07-16 (×4): qty 40

## 2018-07-16 MED ORDER — CHLORHEXIDINE GLUCONATE 0.12% ORAL RINSE (MEDLINE KIT)
15.0000 mL | Freq: Two times a day (BID) | OROMUCOSAL | Status: DC
  Administered 2018-07-16 – 2018-07-19 (×8): 15 mL via OROMUCOSAL

## 2018-07-16 MED ORDER — FENTANYL CITRATE (PF) 100 MCG/2ML IJ SOLN
INTRAMUSCULAR | Status: DC | PRN
  Administered 2018-07-16: 25 ug via INTRAVENOUS

## 2018-07-16 MED ORDER — SEVOFLURANE IN SOLN
RESPIRATORY_TRACT | Status: AC
  Filled 2018-07-16: qty 250

## 2018-07-16 MED ORDER — BUPIVACAINE LIPOSOME 1.3 % IJ SUSP
INTRAMUSCULAR | Status: DC | PRN
  Administered 2018-07-16: 50 mL

## 2018-07-16 MED ORDER — SODIUM CHLORIDE (PF) 0.9 % IJ SOLN
INTRAMUSCULAR | Status: AC
  Filled 2018-07-16: qty 50

## 2018-07-16 MED ORDER — ONDANSETRON HCL 4 MG/2ML IJ SOLN
INTRAMUSCULAR | Status: AC
  Filled 2018-07-16: qty 2

## 2018-07-16 MED ORDER — ROCURONIUM BROMIDE 50 MG/5ML IV SOLN
INTRAVENOUS | Status: AC
  Filled 2018-07-16: qty 1

## 2018-07-16 MED ORDER — LACTATED RINGERS IV SOLN
INTRAVENOUS | Status: DC | PRN
  Administered 2018-07-16: 14:00:00 via INTRAVENOUS

## 2018-07-16 MED ORDER — ONDANSETRON HCL 4 MG/2ML IJ SOLN
INTRAMUSCULAR | Status: DC | PRN
  Administered 2018-07-16: 4 mg via INTRAVENOUS

## 2018-07-16 MED ORDER — LACTATED RINGERS IV BOLUS
1000.0000 mL | Freq: Once | INTRAVENOUS | Status: AC
  Administered 2018-07-16: 1000 mL via INTRAVENOUS

## 2018-07-16 MED ORDER — SODIUM CHLORIDE 0.9 % IV SOLN
2.0000 g | Freq: Two times a day (BID) | INTRAVENOUS | Status: DC
  Administered 2018-07-16: 2 g via INTRAVENOUS
  Filled 2018-07-16 (×2): qty 2

## 2018-07-16 MED ORDER — INSULIN ASPART 100 UNIT/ML ~~LOC~~ SOLN
0.0000 [IU] | SUBCUTANEOUS | Status: DC
  Administered 2018-07-16 (×4): 2 [IU] via SUBCUTANEOUS
  Administered 2018-07-17 (×3): 1 [IU] via SUBCUTANEOUS
  Administered 2018-07-18: 2 [IU] via SUBCUTANEOUS
  Administered 2018-07-18: 1 [IU] via SUBCUTANEOUS
  Administered 2018-07-18: 2 [IU] via SUBCUTANEOUS
  Administered 2018-07-18 – 2018-07-19 (×4): 1 [IU] via SUBCUTANEOUS
  Administered 2018-07-19: 2 [IU] via SUBCUTANEOUS
  Filled 2018-07-16 (×15): qty 1

## 2018-07-16 MED ORDER — ORAL CARE MOUTH RINSE
15.0000 mL | OROMUCOSAL | Status: DC
  Administered 2018-07-16 – 2018-07-19 (×39): 15 mL via OROMUCOSAL

## 2018-07-16 MED ORDER — AMIODARONE LOAD VIA INFUSION
150.0000 mg | Freq: Once | INTRAVENOUS | Status: AC
  Administered 2018-07-18: 150 mg via INTRAVENOUS
  Filled 2018-07-16: qty 83.34

## 2018-07-16 MED ORDER — PROPOFOL 500 MG/50ML IV EMUL
INTRAVENOUS | Status: AC
  Filled 2018-07-16: qty 50

## 2018-07-16 MED ORDER — METOPROLOL TARTRATE 5 MG/5ML IV SOLN
2.5000 mg | Freq: Once | INTRAVENOUS | Status: AC
  Administered 2018-07-16: 2.5 mg via INTRAVENOUS
  Filled 2018-07-16: qty 5

## 2018-07-16 MED ORDER — FENTANYL CITRATE (PF) 100 MCG/2ML IJ SOLN
INTRAMUSCULAR | Status: AC
  Filled 2018-07-16: qty 2

## 2018-07-16 MED ORDER — AMIODARONE HCL IN DEXTROSE 360-4.14 MG/200ML-% IV SOLN
60.0000 mg/h | INTRAVENOUS | Status: AC
  Filled 2018-07-16: qty 200

## 2018-07-16 SURGICAL SUPPLY — 51 items
BAG COUNTER SPONGE EZ (MISCELLANEOUS) ×2 IMPLANT
BANDAGE ELASTIC 4 LF NS (GAUZE/BANDAGES/DRESSINGS) ×3 IMPLANT
BLADE SAGITTAL WIDE XTHICK NO (BLADE) IMPLANT
BLADE SAW GIGLI 510 (BLADE) ×2 IMPLANT
BLADE SAW GIGLI 510MM (BLADE) ×1
BLADE SURG SZ10 CARB STEEL (BLADE) ×3 IMPLANT
BNDG COHESIVE 4X5 TAN STRL (GAUZE/BANDAGES/DRESSINGS) ×3 IMPLANT
BNDG GAUZE 4.5X4.1 6PLY STRL (MISCELLANEOUS) ×6 IMPLANT
BRUSH SCRUB EZ  4% CHG (MISCELLANEOUS) ×2
BRUSH SCRUB EZ 4% CHG (MISCELLANEOUS) ×1 IMPLANT
CANISTER SUCT 1200ML W/VALVE (MISCELLANEOUS) ×3 IMPLANT
CHLORAPREP W/TINT 26ML (MISCELLANEOUS) ×3 IMPLANT
COUNTER SPONGE BAG EZ (MISCELLANEOUS) ×1
COVER WAND RF STERILE (DRAPES) IMPLANT
DRAPE INCISE IOBAN 66X45 STRL (DRAPES) IMPLANT
DRSG GAUZE FLUFF 36X18 (GAUZE/BANDAGES/DRESSINGS) ×6 IMPLANT
ELECT CAUTERY BLADE 6.4 (BLADE) ×3 IMPLANT
ELECT REM PT RETURN 9FT ADLT (ELECTROSURGICAL) ×3
ELECTRODE REM PT RTRN 9FT ADLT (ELECTROSURGICAL) ×1 IMPLANT
GAUZE PETRO XEROFOAM 1X8 (MISCELLANEOUS) ×3 IMPLANT
GAUZE SPONGE 4X4 12PLY STRL (GAUZE/BANDAGES/DRESSINGS) ×3 IMPLANT
GLOVE BIO SURGEON STRL SZ7 (GLOVE) ×3 IMPLANT
GLOVE INDICATOR 7.5 STRL GRN (GLOVE) ×3 IMPLANT
GLOVE SURG SYN 8.0 (GLOVE) ×3 IMPLANT
GOWN STRL REUS W/ TWL LRG LVL3 (GOWN DISPOSABLE) ×1 IMPLANT
GOWN STRL REUS W/ TWL XL LVL3 (GOWN DISPOSABLE) ×1 IMPLANT
GOWN STRL REUS W/TWL LRG LVL3 (GOWN DISPOSABLE) ×2
GOWN STRL REUS W/TWL XL LVL3 (GOWN DISPOSABLE) ×2
HANDLE YANKAUER SUCT BULB TIP (MISCELLANEOUS) ×3 IMPLANT
KIT TURNOVER KIT A (KITS) ×3 IMPLANT
LABEL OR SOLS (LABEL) ×3 IMPLANT
NS IRRIG 500ML POUR BTL (IV SOLUTION) ×3 IMPLANT
PACK EXTREMITY ARMC (MISCELLANEOUS) ×3 IMPLANT
PAD ABD DERMACEA PRESS 5X9 (GAUZE/BANDAGES/DRESSINGS) ×3 IMPLANT
PAD PREP 24X41 OB/GYN DISP (PERSONAL CARE ITEMS) ×3 IMPLANT
SPONGE LAP 18X18 RF (DISPOSABLE) ×3 IMPLANT
STOCKINETTE M/LG 89821 (MISCELLANEOUS) ×3 IMPLANT
SUT MNCRL 4-0 (SUTURE) ×2
SUT MNCRL 4-0 27XMFL (SUTURE) ×1
SUT SILK 2 0 (SUTURE) ×2
SUT SILK 2-0 18XBRD TIE 12 (SUTURE) ×1 IMPLANT
SUT SILK 3 0 (SUTURE) ×2
SUT SILK 3-0 18XBRD TIE 12 (SUTURE) ×1 IMPLANT
SUT VIC AB 0 CT1 36 (SUTURE) ×12 IMPLANT
SUT VIC AB 3-0 SH 27 (SUTURE) ×4
SUT VIC AB 3-0 SH 27X BRD (SUTURE) ×2 IMPLANT
SUT VICRYL PLUS ABS 0 54 (SUTURE) ×3 IMPLANT
SUTURE MNCRL 4-0 27XMF (SUTURE) ×1 IMPLANT
SYR 20CC LL (SYRINGE) ×6 IMPLANT
TAPE UMBIL 1/8X18 RADIOPA (MISCELLANEOUS) ×3 IMPLANT
TOWEL OR 17X26 4PK STRL BLUE (TOWEL DISPOSABLE) ×6 IMPLANT

## 2018-07-16 NOTE — Op Note (Signed)
   OPERATIVE NOTE   PROCEDURE: right below-the-knee amputation  PRE-OPERATIVE DIAGNOSIS: Right below-knee amputation stump gangrene  POST-OPERATIVE DIAGNOSIS: same as above  SURGEON: [name], MD  ASSISTANT(S): None  ANESTHESIA: general  ESTIMATED BLOOD LOSS: 25 cc  FINDING(S): Necrotic muscle bellies of the posterior flap associated with 10 to 15 cc of pus  SPECIMEN(S):  Right below-the-knee amputation stump to pathology for permanent section; deep tissue culture sent to microbiology  INDICATIONS:   Melissa [name] is a 77 y.o. female who presents with right leg gangrene.  The patient is scheduled for a right below-the-knee amputation.  I discussed in depth with the patient the risks, benefits, and alternatives to this procedure.  The patient is aware that the risk of this operation included but are not limited to:  bleeding, infection, myocardial infarction, stroke, death, failure to heal amputation wound, and possible need for more proximal amputation.  The patient is aware of the risks and agrees proceed forward with the procedure.  DESCRIPTION:  After full informed written consent was obtained from the patient, the patient was brought back to the operating room, and placed supine upon the operating table.  Prior to induction, the patient is on the sepsis protocol and is receiving concurrent antibiotics.  The patient was then prepped and draped in the standard fashion for a debridement below-the-knee amputation stump.  After obtaining adequate anesthesia, the patient was prepped and draped in the standard fashion for a debridement of the right below-the-knee amputation stump.   A total of 50 cc of Esper L with quarter percent Marcaine was then infiltrated at the above-knee location circumferentially.  Incision was created through the previous suture line.  Upon opening the fascia 10 to 15 cc of pus was encountered.  The flap was then reflected posteriorly and the entire  gastroc muscle bellies were noted to be dead.  A large piece of necrotic muscle that was in the base of the pelvic purulent material was resected and passed off in his sterile specimen cup for microbiology.  Given this finding the muscles below the knee are not salvageable I therefore created a circumferential incision at the level of the tibial plateau.  The oscillating saw was then used to transect the tibia through the tibial plateau and the very proximal segment of the fibula.  The popliteal artery was encountered and noted to have pulsatile bleeding and this was easily controlled with a Kelly clamp.  The remaining posterior soft tissues were transected with Mayo scissors and the entire specimen was passed off the field en bloc.  The posterior vascular bundle was then controlled with 4 interrupted 0 Vicryl figure-of-eight sutures.  The wound was irrigated and inspected for hemostasis.  The tissues noted at the margin both medially laterally and posteriorly are now noted to be viable and there is no further purulent material encountered.  The wound was then dressed with saline moistened fluffed gauze followed by 4 x 4's followed by ABDs Kerlix and then an Ace wrap.  Patient tolerated procedure without any hemodynamic instability and was taken directly back to the intensive care unit condition at this time is unchanged.  COMPLICATIONS: none  CONDITION: stable   [name]  07/11/2018, 2:42 PM    This note was created with Dragon Medical transcription system. Any errors in dictation are purely unintentional.

## 2018-07-16 NOTE — Plan of Care (Signed)
  Problem: Fluid Volume: Goal: Hemodynamic stability will improve 07/12/2018 0601 by Silver Huguenin, RN Outcome: Progressing 07/07/2018 0600 by Silver Huguenin, RN Outcome: Progressing   Problem: Clinical Measurements: Goal: Diagnostic test results will improve 06/22/2018 0601 by Silver Huguenin, RN Outcome: Progressing 06/29/2018 0600 by Silver Huguenin, RN Outcome: Progressing Goal: Signs and symptoms of infection will decrease 06/25/2018 0601 by Silver Huguenin, RN Outcome: Progressing 06/17/2018 0600 by Silver Huguenin, RN Outcome: Progressing   Problem: Respiratory: Goal: Ability to maintain adequate ventilation will improve 06/18/2018 0601 by Silver Huguenin, RN Outcome: Progressing 06/30/2018 0600 by Silver Huguenin, RN Outcome: Progressing   Problem: Education: Goal: Knowledge of General Education information will improve Description Including pain rating scale, medication(s)/side effects and non-pharmacologic comfort measures 06/22/2018 0601 by Silver Huguenin, RN Outcome: Progressing 06/16/2018 0600 by Silver Huguenin, RN Outcome: Progressing

## 2018-07-16 NOTE — Anesthesia Post-op Follow-up Note (Signed)
Anesthesia QCDR form completed.        

## 2018-07-16 NOTE — Transfer of Care (Signed)
Immediate Anesthesia Transfer of Care Note  Patient: Melissa [name]  Procedure(s) Performed: AMPUTATION BELOW KNEE-REVISION (Right )  Patient Location: ICU  Anesthesia Type:General  Level of Consciousness: sedated and responds to stimulation  Airway & Oxygen Therapy: Patient placed on Ventilator (see vital sign flow sheet for setting)  Post-op Assessment: Report given to RN and Post -op Vital signs reviewed and stable  Post vital signs: Reviewed and stable  Last Vitals:  Vitals Value Taken Time  BP 103/78 07/01/2018  2:54 PM  Temp    Pulse 100 07/05/2018  2:54 PM  Resp    SpO2 98 % 07/14/2018  2:54 PM    Last Pain:  Vitals:   06/19/2018 1200  TempSrc: Oral  PainSc:          Complications: No apparent anesthesia complications

## 2018-07-16 NOTE — Plan of Care (Signed)
  Problem: Fluid Volume: Goal: Hemodynamic stability will improve 06/23/2018 0603 by Silver Huguenin, RN Outcome: Progressing 07/14/2018 0601 by Silver Huguenin, RN Outcome: Progressing 06/21/2018 0600 by Silver Huguenin, RN Outcome: Progressing   Problem: Clinical Measurements: Goal: Diagnostic test results will improve 07/11/2018 0603 by Silver Huguenin, RN Outcome: Progressing 06/21/2018 0601 by Silver Huguenin, RN Outcome: Progressing 06/21/2018 0600 by Silver Huguenin, RN Outcome: Progressing Goal: Signs and symptoms of infection will decrease 07/06/2018 0603 by Silver Huguenin, RN Outcome: Progressing 07/14/2018 0601 by Silver Huguenin, RN Outcome: Progressing 07/06/2018 0600 by Silver Huguenin, RN Outcome: Progressing   Problem: Respiratory: Goal: Ability to maintain adequate ventilation will improve 07/08/2018 0603 by Silver Huguenin, RN Outcome: Progressing 06/27/2018 0601 by Silver Huguenin, RN Outcome: Progressing 06/30/2018 0600 by Silver Huguenin, RN Outcome: Progressing

## 2018-07-16 NOTE — Plan of Care (Signed)
  Problem: Fluid Volume: Goal: Hemodynamic stability will improve Outcome: Progressing

## 2018-07-16 NOTE — Care Management Note (Signed)
Case Management Note  Patient Details  Name: Melissa [name] MRN: [id number] Date of Birth: 05/26/1941  Subjective/Objective:    Patient admitted with sepsis.  Patient was discharged from the hospital last week after having a R BKA and went to [facility] for rehab.  Patient had a previous L BKA back in May of this year.  Patient is in the OR having the R BKA debrided.   Patient's 2 daughters and son are in the patient room, they report that the patient lived at home with her spouse and was independent in basic ADL's like toiletting but was confined to a wheelchair and the bed and was receiving home health services with [facility].  Family is hopeful that the patient will be able to go back to rehab once better.  RNCM will cont to follow to assess for any discharge needs.   Doran Clay RN BSN [phone]                 Action/Plan:   Expected Discharge Date:                  Expected Discharge Plan:  [place]  In-House Referral:     Discharge planning Services  CM Consult  Post Acute Care Choice:    Choice offered to:     DME Arranged:    DME Agency:     HH Arranged:    [place] Agency:     Status of Service:  In process, will continue to follow  If discussed at Long Length of Stay Meetings, dates discussed:    Additional Comments:  Shelbie Hutching, RN 07/09/2018, 2:24 PM

## 2018-07-16 NOTE — Progress Notes (Signed)
Initial Nutrition Assessment  DOCUMENTATION CODES:   Severe malnutrition in context of chronic illness  INTERVENTION:  If patient remains intubated >24-48 hours, recommend initiating Vital High Protein at 40 mL/hr (960 mL goal daily volume) per OGT. Provides 960 kcal, 84 grams of protein, 806 mL H2O daily. With current propofol rate provides 1269 kcal daily.  If tube feeds are initiated recommend a minimum free water flush of 30 mL Q4hrs to maintain tube patency, and a liquid MVI daily per tube to meet 100% RDIs for vitamins/minerals.  Recommend vitamin C 250 mg BID per tube.  Monitor magnesium, potassium, and phosphorus daily for at least 3 days, MD to replete as needed, as pt is at risk for refeeding syndrome given severe malnutrition.  NUTRITION DIAGNOSIS:   Severe Malnutrition related to chronic illness(COPD) as evidenced by severe fat depletion, severe muscle depletion.  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   Patient is intubated and sedated. On PRVC mode with FiO2 21% and PEEP 5 cmH2O. Abdomen is soft. Patient has had 3 type 6 BMs so far today. Patient has been fairly weight-stable for the past year.  Enteral Access: OGT placed 12/30; terminates in gastric body per abdominal x-ray 12/30; tube not yet documented in chart so cannot see where cm marking is at corner of mouth  MAP: 87-99 mmHg  Patient is currently intubated on ventilator support Ve: 8  L/min Temp (24hrs), Avg:98.7 F (37.1 C), Min:97.5 F (36.4 C), Max:100.6 F (38.1 C)  Propofol: 11.7 mL/hr (309 kcal daily)  Medications reviewed and include: Amiodraone, Solu-Cortef 100 mg Q8hrs IV, Novolog 0-9 units Q4hrs, Protonix, sertraline, NS @ 75 mL/hr, ceftriaxone, phenylephrine gtt at 120 mcg/min, propofol gtt, vasopressin gtt at 0.03 units/min.  Labs reviewed: CBG 150-171, Chloride 115, CO2 17, BUN 76, Creatinine 1.05.  I/O: 1200 mL UOP yesterday  Discussed with RN and on rounds.  NUTRITION - FOCUSED PHYSICAL EXAM:    Most Recent Value  Orbital Region  Severe depletion  Upper Arm Region  Severe depletion  Thoracic and Lumbar Region  Severe depletion  Buccal Region  Unable to assess  Temple Region  Severe depletion  Clavicle Bone Region  Severe depletion  Clavicle and Acromion Bone Region  Severe depletion  Scapular Bone Region  Unable to assess  Dorsal Hand  Severe depletion  Patellar Region  Severe depletion  Anterior Thigh Region  Severe depletion  Posterior Calf Region  Unable to assess  Edema (RD Assessment)  Mild  Hair  Reviewed  Eyes  Unable to assess  Mouth  Unable to assess  Skin  Reviewed  Nails  Reviewed     Diet Order:   Diet Order            Diet NPO time specified  Diet effective now             EDUCATION NEEDS:   Not appropriate for education at this time  Skin:  Skin Assessment: Skin Integrity Issues:(stage I to medial sacrum;  s/p bilateral BKAs)  Last BM:  06/26/2018 - small type 6  Height:   Ht Readings from Last 1 Encounters:  07/08/2018 5' 4"  (1.626 m)   Weight:   Wt Readings from Last 1 Encounters:  07/07/2018 48.6 kg   Ideal Body Weight:  48 kg(adjusted)  BMI:  Body mass index is 18.39 kg/m.  Estimated Nutritional Needs:   Kcal:  1322 (PSU 2003b w/ MSJ 962, Ve 8, Tmax 38.1)  Protein:  75-85 grams (1.5-1.8 grams/kg)  Fluid:  1.2 L/day (25 mL/kg)  Willey Blade, MS, RD, LDN Office: [phone] Pager:  [phone] After Hours/Weekend Pager: [phone]

## 2018-07-16 NOTE — Progress Notes (Signed)
Follow up - Critical Care Medicine Note  Patient Details:    Melissa [name] is an 77 y.o. 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  Found to be unresponsive, hypotensive requiring central line placement, fluid resuscitation, intubation, pressors and broad-spectrum antibiotic coverage.  Has been seen by vascular surgery who plans on performing a debridement.    Lines, Airways, Drains: Airway 7 mm (Active)  Secured at (cm) 21 cm 07/05/2018  3:07 AM  Measured From Lips 07/05/2018  3:07 AM  Secured Location Center 07/02/2018  3:07 AM  Secured By [employer] 06/21/2018  3:07 AM  Tube Holder Repositioned Yes 06/18/2018  3:07 AM  Cuff Pressure (cm H2O) 26 cm H2O 06/26/2018  7:37 PM  Site Condition Dry 07/02/2018  7:37 PM     CVC Triple Lumen 07/04/2018 Right Internal jugular (Active)  Indication for Insertion or Continuance of Line Vasoactive infusions 07/06/2018  9:00 PM  Site Assessment Clean;Dry;Intact 07/14/2018  9:00 PM  Proximal Lumen Status Infusing 06/24/2018  9:00 PM  Medial Lumen Status Infusing 06/27/2018  9:00 PM  Distal Lumen Status Infusing 07/13/2018  9:00 PM  Dressing Type Transparent 06/17/2018  9:00 PM  Dressing Status Clean;Dry;Intact 07/14/2018  9:00 PM  Line Care Zeroed and calibrated 06/27/2018  9:00 PM     Urethral Catheter Melissa [name] Double-lumen;Non-latex 14 Fr. (Active)  Indication for Insertion or Continuance of Catheter Unstable critical patients (first 24-48 hours) 07/12/2018  4:00 AM  Site Assessment Clean;Intact 06/20/2018  4:00 AM  Catheter Maintenance Bag below level of bladder;Catheter secured;Drainage bag/tubing not touching  floor;Insertion date on drainage bag;No dependent loops;Seal intact;Bag emptied prior to transport 06/29/2018  5:10 PM  Collection Container Standard drainage bag 07/05/2018  5:10 PM  Urinary Catheter Interventions Unclamped 07/13/2018  5:10 PM  Output (mL) 400 mL 06/17/2018  1:24 AM    Anti-infectives:  Anti-infectives (From admission, onward)   Start     Dose/Rate Route Frequency Ordered Stop   07/17/18 0000  vancomycin (VANCOCIN) IVPB 750 mg/150 ml premix     750 mg 150 mL/hr over 60 Minutes Intravenous Every 48 hours 06/30/2018 1433     06/18/2018 1000  ceFEPIme (MAXIPIME) 1 g in sodium chloride 0.9 % 100 mL IVPB  Status:  Discontinued     1 g 200 mL/hr over 30 Minutes Intravenous Every 12 hours 07/09/2018 1433 07/14/2018 0346   07/12/2018 0400  ceFEPIme (MAXIPIME) 2 g in sodium chloride 0.9 % 100 mL IVPB     2 g 200 mL/hr over 30 Minutes Intravenous Every 12 hours 06/30/2018 0346     06/19/2018 1300  ceFEPIme (MAXIPIME) 2 g in sodium chloride 0.9 % 100 mL IVPB     2 g 200 mL/hr over 30 Minutes Intravenous  Once 06/26/2018 1252 06/26/2018 1403   06/24/2018 1300  metroNIDAZOLE (FLAGYL) IVPB 500 mg     500 mg 100 mL/hr over 60 Minutes Intravenous Every 8 hours 07/14/2018 1252     07/05/2018 1300  vancomycin (VANCOCIN) IVPB 1000 mg/200 mL premix     1,000 mg 200 mL/hr over 60 Minutes Intravenous  Once 07/06/2018 1252 06/21/2018 1437      Microbiology: Results for orders placed or performed during the hospital encounter of 07/13/2018  Blood Culture (routine x  2)     Status: None (Preliminary result)   Collection Time: 07/01/2018 12:52 PM  Result Value Ref Range Status   Specimen Description BLOOD RIGHT HAND  Final   Special Requests   Final    BOTTLES DRAWN AEROBIC AND ANAEROBIC Blood Culture adequate volume   Culture  Setup Time   Final    Organism ID to follow GRAM NEGATIVE RODS ANAEROBIC BOTTLE ONLY CRITICAL RESULT CALLED TO, READ BACK BY AND VERIFIED WITH: Melissa [name] 07/14/2018 @ 0236  [place] Performed  at [facility], [address] [place], [place] [address]    Culture GRAM NEGATIVE RODS  Final   Report Status PENDING  Incomplete  Blood Culture (routine x 2)     Status: None (Preliminary result)   Collection Time: 06/25/2018 12:52 PM  Result Value Ref Range Status   Specimen Description BLOOD BLOOD LEFT HAND  Final   Special Requests   Final    BOTTLES DRAWN AEROBIC AND ANAEROBIC Blood Culture adequate volume   Culture  Setup Time   Final    GRAM NEGATIVE RODS ANAEROBIC BOTTLE ONLY CRITICAL RESULT CALLED TO, READ BACK BY AND VERIFIED WITH: Melissa [name] 06/20/2018 @ 0236  [place] Performed at [facility], [address] [place], [place] [address]    Culture GRAM NEGATIVE RODS  Final   Report Status PENDING  Incomplete  Blood Culture ID Panel (Reflexed)     Status: Abnormal   Collection Time: 06/30/2018 12:52 PM  Result Value Ref Range Status   Enterococcus species NOT DETECTED NOT DETECTED Final   Listeria monocytogenes NOT DETECTED NOT DETECTED Final   Staphylococcus species NOT DETECTED NOT DETECTED Final   Staphylococcus aureus (BCID) NOT DETECTED NOT DETECTED Final   Streptococcus species NOT DETECTED NOT DETECTED Final   Streptococcus agalactiae NOT DETECTED NOT DETECTED Final   Streptococcus pneumoniae NOT DETECTED NOT DETECTED Final   Streptococcus pyogenes NOT DETECTED NOT DETECTED Final   Acinetobacter baumannii NOT DETECTED NOT DETECTED Final   Enterobacteriaceae species DETECTED (A) NOT DETECTED Final    Comment: Enterobacteriaceae represent a large family of gram-negative bacteria, not a single organism. CRITICAL RESULT CALLED TO, READ BACK BY AND VERIFIED WITH: Melissa [name] 07/14/2018 @ 0236 [place]    Enterobacter cloacae complex NOT DETECTED NOT DETECTED Final   Escherichia coli NOT DETECTED NOT DETECTED Final   Klebsiella oxytoca NOT DETECTED NOT DETECTED Final   Klebsiella pneumoniae NOT DETECTED NOT DETECTED Final   Proteus species DETECTED (A)  NOT DETECTED Final    Comment: CRITICAL RESULT CALLED TO, READ BACK BY AND VERIFIED WITH: Melissa [name] 07/14/2018 @ 0236  [facility]    Serratia marcescens NOT DETECTED NOT DETECTED Final   Carbapenem resistance NOT DETECTED NOT DETECTED Final   Haemophilus influenzae NOT DETECTED NOT DETECTED Final   Neisseria meningitidis NOT DETECTED NOT DETECTED Final   Pseudomonas aeruginosa NOT DETECTED NOT DETECTED Final   Candida albicans NOT DETECTED NOT DETECTED Final   Candida glabrata NOT DETECTED NOT DETECTED Final   Candida krusei NOT DETECTED NOT DETECTED Final   Candida parapsilosis NOT DETECTED NOT DETECTED Final   Candida tropicalis NOT DETECTED NOT DETECTED Final    Comment: Performed at [facility], [place], [place] [address]  MRSA PCR Screening     Status: None   Collection Time: 06/28/2018  4:59 PM  Result Value Ref Range Status   MRSA by PCR NEGATIVE NEGATIVE Final    Comment:  The GeneXpert MRSA Assay (FDA approved for NASAL specimens only), is one component of a comprehensive MRSA colonization surveillance program. It is not intended to diagnose MRSA infection nor to guide or monitor treatment for MRSA infections. Performed at [facility], [address] [place],  [address]    Studies: Dg Wrist 2 Views Right  Result Date: 07/05/2018 CLINICAL DATA:  Fall, landed on right wrist.  Pain EXAM: RIGHT WRIST - 2 VIEW COMPARISON:  None. 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 Diffuse vascular calcifications. No fracture. Slight widening of the scapholunate distance. IMPRESSION: Degenerative changes in the right wrist.  No fracture. Slight widening of the scapholunate distance could reflect scapholunate dissociation. Electronically Signed   By: Rolm Baptise M.D.   On: 07/05/2018 10:35   Dg Knee 2 Views Right  Result Date: 07/05/2018 CLINICAL DATA:  Right below-knee amputation  stump infection. EXAM: RIGHT KNEE - 1-2 VIEW COMPARISON:  None. FINDINGS: Status post right below-knee amputation. Vascular calcifications are noted no lytic defect is seen. However, small amount of gas is noted in the distal soft tissues of the stump suggesting infection. IMPRESSION: Small amount of soft tissue gas seen in distal portion of BKA stump suggesting infection. Electronically Signed   By: Marijo Conception, M.D.   On: 07/05/2018 13:31   Dg Abd 1 View  Result Date: 06/18/2018 CLINICAL DATA:  OG placement EXAM: ABDOMEN - 1 VIEW COMPARISON:  CT 01/19/2017 FINDINGS: Esophageal tube tip overlies the gastric body. Visible upper gas pattern is unobstructed. Surgical hardware in the lower spine. IMPRESSION: Esophageal tube tip overlies the mid abdomen, presumably over the gastric body. Electronically Signed   By: Donavan Foil M.D.   On: 06/30/2018 18:25   Ct Head Wo Contrast  Result Date: 07/05/2018 CLINICAL DATA:  Fall EXAM: CT HEAD WITHOUT CONTRAST TECHNIQUE: Contiguous axial images were obtained from the base of the skull through the vertex without intravenous contrast. COMPARISON:  06/08/2017 FINDINGS: Brain: Mild global atrophy appropriate to age. Minimal chronic ischemic changes in the periventricular white matter. There is no mass effect, midline shift, or acute intracranial hemorrhage. Vascular: No hyperdense vessel or unexpected calcification. Skull: The cranium is intact. Sinuses/Orbits: Visualized mastoid air cells and paranasal sinuses are clear. Orbits are grossly within normal limits. Other: Soft tissue hematoma over the right temporal bone is noted. IMPRESSION: No acute intracranial pathology. Electronically Signed   By: Marybelle Killings M.D.   On: 07/05/2018 09:03   Dg Chest Portable 1 View  Result Date: 07/02/2018 CLINICAL DATA:  ET tube insertion EXAM: PORTABLE CHEST 1 VIEW COMPARISON:  07/11/2018 FINDINGS: Endotracheal tube with the tip 3.3 cm above the carina. Right jugular central  venous catheter with the tip projecting over the SVC. Chronic elevation of the right diaphragm. Mild hazy left basilar airspace disease likely reflecting atelectasis. No pleural effusion or pneumothorax. Stable cardiomediastinal silhouette. No aggressive osseous lesion. IMPRESSION: Endotracheal tube with the tip 3.3 cm above the carina. Right jugular central venous catheter with the tip projecting over the SVC. Electronically Signed   By: Kathreen Devoid   On: 07/01/2018 16:10   Dg Chest Portable 1 View  Result Date: 07/05/2018 CLINICAL DATA:  Central line placement EXAM: PORTABLE CHEST 1 VIEW COMPARISON:  07/09/2018 FINDINGS: Right central line is in place with the tip at the cavoatrial junction. No pneumothorax. Bibasilar atelectasis. Mild vascular congestion. Suspect small effusions. No acute bony abnormality. IMPRESSION: Right central line tip at the cavoatrial  junction.  No pneumothorax. Vascular congestion, bibasilar atelectasis and possible small effusions. Electronically Signed   By: Rolm Baptise M.D.   On: 07/02/2018 15:07   Dg Chest Port 1 View  Result Date: 06/22/2018 CLINICAL DATA:  Recent right leg amputation.  Possible sepsis. EXAM: PORTABLE CHEST 1 VIEW COMPARISON:  07/04/2018 FINDINGS: Heart is normal size. Bibasilar atelectasis or infiltrates noted. Mild elevation of the right hemidiaphragm. No visible effusions or acute bony abnormality. IMPRESSION: Bibasilar atelectasis or infiltrates. Mild elevation of the right hemidiaphragm. Electronically Signed   By: Rolm Baptise M.D.   On: 07/11/2018 13:24   Dg Chest Port 1 View  Result Date: 07/04/2018 CLINICAL DATA:  Chest pain EXAM: PORTABLE CHEST 1 VIEW COMPARISON:  12/14/2017 FINDINGS: Heart is normal size. Chronic increased markings throughout the lungs, likely scarring. No effusions or confluent opacities. No acute bony abnormality. IMPRESSION: Chronic changes.  No active disease. Electronically Signed   By: Rolm Baptise M.D.   On:  07/04/2018 11:07    Consults: Treatment Team:  Pccm, Ander Gaster, MD Algernon Huxley, MD   Subjective:    Overnight Issues: Overnight patient's hemodynamics have been somewhat stabilized, presently on vasopressin and Neo-Synephrine.  Amiodarone started this morning for atrial fibrillation.  Remains sedated on propofol  Objective:  Vital signs for last 24 hours: Temp:  [98.4 F (36.9 C)-100.6 F (38.1 C)] 98.4 F (36.9 C) (12/31 0400) Pulse Rate:  [79-140] 120 (12/31 0500) Resp:  [12-31] 23 (12/31 0600) BP: (66-126)/(35-112) 113/83 (12/31 0600) SpO2:  [88 %-100 %] 97 % (12/31 0500) FiO2 (%):  [21 %] 21 % (12/31 0307) Weight:  [48.6 kg-48.9 kg] 48.6 kg (12/30 1705)  Hemodynamic parameters for last 24 hours: CVP:  [8 mmHg] 8 mmHg  Intake/Output from previous day: 12/30 0701 - 12/31 0700 In: 5482.8 [I.V.:1257.7; IV Piggyback:4225.1] Out: 1200 [Urine:1200]  Intake/Output this shift: No intake/output data recorded.  Vent settings for last 24 hours: Vent Mode: PRVC FiO2 (%):  [21 %] 21 % Set Rate:  [16 bmp] 16 bmp Vt Set:  [400 mL] 400 mL PEEP:  [5 cmH20] 5 cmH20 Plateau Pressure:  [12 cmH20] 12 cmH20  Physical Exam:  Vital signs:       Please see the above listed vital signs Patient is unresponsive, on mechanical ventilation, norepinephrine, tachycardic with a pulse of 140 and marginal blood pressures HEENT:           Patient is orally intubated, trachea midline, no thyromegaly appreciated, right internal jugular central line has been placed Cardiovascular:           Tachycardia, appears to be sinus tachycardia with a rate of 1 30-1 40 Pulmonary:      Rhonchi appreciated diffusely Abdominal:      Positive bowel sounds, soft exam Extremities:     Fluctuance noted in the right BKA with some demarcation and skin with foul-smelling drainage Neurologic:      Patient is on sedation status post intubation limited exam at this time  Assessment/Plan:  Septic shock.  Most likely  source is right BKA site, has been placed on cefepime, vancomycin, vasopressin, Neo-Synephrine secondary to rapid atrial fibrillation, fluid resuscitation and empiric hydrocortisone .  Patient has grown Enterobacteriaceae species, Proteus and blood, pending sensitivities.  Patient is pending debridement today vascular surgery  Acid-base derangement.    Anion gap of 13, CO2 of 17Leukocytosis.    White count is up to 39.4  Anemia.    Hemoglobin 8.1  Prerenal azotemia.  BUN 76/creatinine 1.05.  Patient CVP was measured at 8, was given additional fluids.  Total input yesterday was +4.3L  Elevated troponin at 0.09.  Most likely reflect supply demand mismatch and secondary to hypotension.  Pending EKG, will cycle cardiac enzymes.  Echo performed in June revealed mild reduction in systolic function with ejection fraction of 45 to 50% along with valvular heart disease, mitral regurgitation noted   Critical Care Total Time 35  Anuhea Gassner 06/17/2018  *Care during the described time interval was provided by me and/or other providers on the critical care team.  I have reviewed this patient's available data, including medical history, events of note, physical examination and test results as part of my evaluation.

## 2018-07-16 NOTE — Progress Notes (Signed)
[facility] [place] NAME: Melissa [name]    MR#:  [id number]  DATE OF BIRTH:  May 31, 1941  SUBJECTIVE:  CHIEF COMPLAINT:   Chief Complaint  Patient presents with  . Altered Mental Status   -Patient with recent right BKA for severe PAD was sent to rehab less than a week ago brought back in for unresponsive episode and noted to be septic and infection at the stump site. Intubated and sedated.  On 2 pressors.  Going to the OR for wound debridement today  REVIEW OF SYSTEMS:  Review of Systems  Unable to perform ROS: Critical illness    DRUG ALLERGIES:   Allergies  Allergen Reactions  . Lodine [Etodolac] Hives and Itching  . Methotrexate Derivatives Other (See Comments)    Flu like symptoms  . Nitrofuran Derivatives Nausea And Vomiting and Other (See Comments)    Made sicker   . Levaquin [Levofloxacin] Itching, Rash and Other (See Comments)    Muscle pain  . Norvasc [Amlodipine] Other (See Comments)    Unknown   . Pentasa [Mesalamine Er] Other (See Comments)    Unknown   . Sulfa Antibiotics Itching and Rash  . Vioxx [Rofecoxib] Other (See Comments)    Unknown     VITALS:  Blood pressure 115/85, pulse (!) 116, temperature 97.6 F (36.4 C), temperature source Oral, resp. rate 17, height 5' 4"  (1.626 m), weight 48.6 kg, SpO2 97 %.  PHYSICAL EXAMINATION:  Physical Exam   GENERAL:  77 y.o.-year-old patient lying in the bed, debated on the ventilator.  Critically ill-appearing EYES: Pupils equal, round, reactive to light and accommodation. No scleral icterus. Extraocular muscles intact.  HEENT: Head atraumatic, normocephalic. Oropharynx and nasopharynx clear.  NECK:  Supple, no jugular venous distention. No thyroid enlargement, no tenderness.  LUNGS: Normal breath sounds bilaterally, no wheezing, rales,rhonchi or crepitation. No use of accessory muscles of respiration.  Decreased bibasilar breath sounds CARDIOVASCULAR: S1, S2 normal.  No  rubs, or gallops.  2/6 systolic murmur is present ABDOMEN: Soft, nontender, nondistended. Bowel sounds present. No organomegaly or mass.  EXTREMITIES: No pedal edema, cyanosis, or clubbing.  NEUROLOGIC: Intubated and sedated.  Not responding PSYCHIATRIC: The patient is sedated.  SKIN: No obvious rash, lesion, or ulcer.    LABORATORY PANEL:   CBC Recent Labs  Lab 06/29/2018 0402  WBC 39.4*  HGB 8.1*  HCT 25.8*  PLT 445*   ------------------------------------------------------------------------------------------------------------------  Chemistries  Recent Labs  Lab 06/16/2018 0402  NA 145  K 4.2  CL 115*  CO2 17*  GLUCOSE 207*  BUN 76*  CREATININE 1.05*  CALCIUM 6.7*  AST 66*  ALT 46*  ALKPHOS 117  BILITOT 0.9   ------------------------------------------------------------------------------------------------------------------  Cardiac Enzymes Recent Labs  Lab 07/12/2018 1935  TROPONINI 0.07*   ------------------------------------------------------------------------------------------------------------------  RADIOLOGY:  Dg Knee 2 Views Right  Result Date: 06/20/2018 CLINICAL DATA:  Right below-knee amputation stump infection. EXAM: RIGHT KNEE - 1-2 VIEW COMPARISON:  None. FINDINGS: Status post right below-knee amputation. Vascular calcifications are noted no lytic defect is seen. However, small amount of gas is noted in the distal soft tissues of the stump suggesting infection. IMPRESSION: Small amount of soft tissue gas seen in distal portion of BKA stump suggesting infection. Electronically Signed   By: Marijo Conception, M.D.   On: 06/20/2018 13:31   Dg Abd 1 View  Result Date: 07/02/2018 CLINICAL DATA:  OG placement EXAM: ABDOMEN - 1 VIEW COMPARISON:  CT 01/19/2017  FINDINGS: Esophageal tube tip overlies the gastric body. Visible upper gas pattern is unobstructed. Surgical hardware in the lower spine. IMPRESSION: Esophageal tube tip overlies the mid abdomen,  presumably over the gastric body. Electronically Signed   By: Donavan Foil M.D.   On: 07/09/2018 18:25   Dg Chest Port 1 View  Result Date: 06/23/2018 CLINICAL DATA:  Intubation. EXAM: PORTABLE CHEST 1 VIEW COMPARISON:  07/07/2018. FINDINGS: Interim placement of NG tube, its tip is below left hemidiaphragm. Endotracheal tube, right IJ line stable position. Heart size normal. Bibasilar. Bibasilar infiltrates/edema. Small bilateral pleural effusions. IMPRESSION: 1. Interim placement of NG tube, its tip is below left hemidiaphragm. Endotracheal tube and right IJ line stable position. 2. Bibasilar atelectasis and bibasilar infiltrates/edema. Small bilateral pleural effusions. Electronically Signed   By: Marcello Moores  Register   On: 06/19/2018 09:06   Dg Chest Portable 1 View  Result Date: 06/17/2018 CLINICAL DATA:  ET tube insertion EXAM: PORTABLE CHEST 1 VIEW COMPARISON:  07/03/2018 FINDINGS: Endotracheal tube with the tip 3.3 cm above the carina. Right jugular central venous catheter with the tip projecting over the SVC. Chronic elevation of the right diaphragm. Mild hazy left basilar airspace disease likely reflecting atelectasis. No pleural effusion or pneumothorax. Stable cardiomediastinal silhouette. No aggressive osseous lesion. IMPRESSION: Endotracheal tube with the tip 3.3 cm above the carina. Right jugular central venous catheter with the tip projecting over the SVC. Electronically Signed   By: Kathreen Devoid   On: 06/24/2018 16:10   Dg Chest Portable 1 View  Result Date: 06/29/2018 CLINICAL DATA:  Central line placement EXAM: PORTABLE CHEST 1 VIEW COMPARISON:  07/08/2018 FINDINGS: Right central line is in place with the tip at the cavoatrial junction. No pneumothorax. Bibasilar atelectasis. Mild vascular congestion. Suspect small effusions. No acute bony abnormality. IMPRESSION: Right central line tip at the cavoatrial junction.  No pneumothorax. Vascular congestion, bibasilar atelectasis and possible  small effusions. Electronically Signed   By: Rolm Baptise M.D.   On: 06/27/2018 15:07   Dg Chest Port 1 View  Result Date: 07/07/2018 CLINICAL DATA:  Recent right leg amputation.  Possible sepsis. EXAM: PORTABLE CHEST 1 VIEW COMPARISON:  07/04/2018 FINDINGS: Heart is normal size. Bibasilar atelectasis or infiltrates noted. Mild elevation of the right hemidiaphragm. No visible effusions or acute bony abnormality. IMPRESSION: Bibasilar atelectasis or infiltrates. Mild elevation of the right hemidiaphragm. Electronically Signed   By: Rolm Baptise M.D.   On: 07/09/2018 13:24    EKG:   Orders placed or performed during the hospital encounter of 07/12/2018  . ED EKG 12-Lead  . ED EKG 12-Lead  . EKG 12-Lead  . EKG 12-Lead    ASSESSMENT AND PLAN:   77 year old female with past medical history significant for COPD, CAD, GERD, severe PAD with prior left BKA and most recent right BKA less than 2 weeks ago discharged to rehab presents with sepsis  1.  Severe sepsis with septic shock-presented with unresponsive episode -Intubated for airway protection -White count is significantly high.  Blood cultures are growing gram-negative rods -Continue supportive antibiotics with IV Rocephin at this time. -We will need further debridement of the wound of the right leg stump.  It seems like that is the source of infection -Currently on vasopressors to maintain her blood pressure -Crestor steroids added  2.  Metabolic acidosis-started on bicarb.  Management per ICU team.  Secondary to sepsis  3.  Leukocytosis-secondary to sepsis.  Monitor while on antibiotics and after procedures  4.  Anemia of  chronic disease-had recent procedures.  Continue to monitor.  No acute indication for transfusion at this time  5.  Elevated troponin-likely demand ischemia.  Continue to monitor cardiac enzymes.  6.  PAD-left BKA done in summer 2019 and now with right BKA done recently.  Management per vascular team  7.   Tachycardia / A. fib with RVR-started on amiodarone drip  8.  DVT prophylaxis-patient is status post bilateral BKA   Daughter was present at bedside and updated this morning    All the records are reviewed and case discussed with Care Management/Social Workerr. Management plans discussed with the patient, family and they are in agreement.  CODE STATUS: Full Code  TOTAL TIME TAKING CARE OF THIS PATIENT: 38 minutes.   POSSIBLE D/C IN ? DAYS, DEPENDING ON CLINICAL CONDITION.   Gladstone Lighter M.D on 07/05/2018 at 12:35 PM  Between 7am to 6pm - Pager - [phone]  After 6pm go to [email or web address] - password [email or web address] [facility]  Office  [phone]  CC: Primary care physician; [name], MD

## 2018-07-16 NOTE — Progress Notes (Signed)
PHARMACY - PHYSICIAN COMMUNICATION CRITICAL VALUE ALERT - BLOOD CULTURE IDENTIFICATION (BCID)  Melissa [name] is an 77 y.o. female who presented to [facility] on 06/22/2018 with a chief complaint of AMS w/ h/o BKA and gas seen in tissue of stump.  Assessment:  Tmax 100.6, HR 100 - 140, hypotensive systolic 66 - 86, WBC 20.2, XRK: Small amount of soft tissue gas seen in distal portion of BKA stump suggesting infection. 2/4 GNR BCID Enterobacteriaceae Proteus KPC -   Name of physician (or Provider) Contacted: Marda Stalker  Current antibiotics: Cefepime/Vanc/metronidazole  Changes to prescribed antibiotics recommended:  Recommendations accepted by provider -- Considering patient is in critical condition will leave all antibiotics as is for now, but will increase cefepime from 1g q12h to 2g IV q12h to treat more aggressively.  Results for orders placed or performed during the hospital encounter of 07/05/2018  Blood Culture ID Panel (Reflexed) (Collected: 06/23/2018 12:52 PM)  Result Value Ref Range   Enterococcus species NOT DETECTED NOT DETECTED   Listeria monocytogenes NOT DETECTED NOT DETECTED   Staphylococcus species NOT DETECTED NOT DETECTED   Staphylococcus aureus (BCID) NOT DETECTED NOT DETECTED   Streptococcus species NOT DETECTED NOT DETECTED   Streptococcus agalactiae NOT DETECTED NOT DETECTED   Streptococcus pneumoniae NOT DETECTED NOT DETECTED   Streptococcus pyogenes NOT DETECTED NOT DETECTED   Acinetobacter baumannii NOT DETECTED NOT DETECTED   Enterobacteriaceae species DETECTED (A) NOT DETECTED   Enterobacter cloacae complex NOT DETECTED NOT DETECTED   Escherichia coli NOT DETECTED NOT DETECTED   Klebsiella oxytoca NOT DETECTED NOT DETECTED   Klebsiella pneumoniae NOT DETECTED NOT DETECTED   Proteus species DETECTED (A) NOT DETECTED   Serratia marcescens NOT DETECTED NOT DETECTED   Carbapenem resistance NOT DETECTED NOT DETECTED   Haemophilus influenzae NOT DETECTED NOT  DETECTED   Neisseria meningitidis NOT DETECTED NOT DETECTED   Pseudomonas aeruginosa NOT DETECTED NOT DETECTED   Candida albicans NOT DETECTED NOT DETECTED   Candida glabrata NOT DETECTED NOT DETECTED   Candida krusei NOT DETECTED NOT DETECTED   Candida parapsilosis NOT DETECTED NOT DETECTED   Candida tropicalis NOT DETECTED NOT DETECTED   Tobie Lords, PharmD, BCPS Clinical Pharmacist 06/16/2018

## 2018-07-17 ENCOUNTER — Inpatient Hospital Stay: Payer: Medicare HMO

## 2018-07-17 LAB — CBC
HCT: 24.4 % — ABNORMAL LOW (ref 36.0–46.0)
Hemoglobin: 7.7 g/dL — ABNORMAL LOW (ref 12.0–15.0)
MCH: 30.2 pg (ref 26.0–34.0)
MCHC: 31.6 g/dL (ref 30.0–36.0)
MCV: 95.7 fL (ref 80.0–100.0)
Platelets: 312 10*3/uL (ref 150–400)
RBC: 2.55 MIL/uL — AB (ref 3.87–5.11)
RDW: 15.1 % (ref 11.5–15.5)
WBC: 29.4 10*3/uL — ABNORMAL HIGH (ref 4.0–10.5)
nRBC: 0 % (ref 0.0–0.2)

## 2018-07-17 LAB — BASIC METABOLIC PANEL
Anion gap: 8 (ref 5–15)
Anion gap: 7 (ref 5–15)
BUN: 55 mg/dL — ABNORMAL HIGH (ref 8–23)
BUN: 51 mg/dL — ABNORMAL HIGH (ref 8–23)
CHLORIDE: 121 mmol/L — AB (ref 98–111)
CO2: 16 mmol/L — ABNORMAL LOW (ref 22–32)
CO2: 17 mmol/L — ABNORMAL LOW (ref 22–32)
Calcium: 5.9 mg/dL — CL (ref 8.9–10.3)
Calcium: 6.5 mg/dL — ABNORMAL LOW (ref 8.9–10.3)
Chloride: 124 mmol/L — ABNORMAL HIGH (ref 98–111)
Creatinine, Ser: 0.65 mg/dL (ref 0.44–1.00)
Creatinine, Ser: 0.66 mg/dL (ref 0.44–1.00)
GFR calc Af Amer: >60 mL/min (ref >60)
GFR calc Af Amer: >60 mL/min (ref >60)
GFR calc non Af Amer: >60 mL/min (ref >60)
GFR calc non Af Amer: >60 mL/min (ref >60)
GLUCOSE: 126 mg/dL — AB (ref 70–99)
Glucose, Bld: 154 mg/dL — ABNORMAL HIGH (ref 70–99)
POTASSIUM: 3 mmol/L — AB (ref 3.5–5.1)
Potassium: 3.4 mmol/L — ABNORMAL LOW (ref 3.5–5.1)
Sodium: 145 mmol/L (ref 135–145)
Sodium: 148 mmol/L — ABNORMAL HIGH (ref 135–145)

## 2018-07-17 LAB — BLOOD GAS, ARTERIAL
Acid-base deficit: 7 mmol/L — ABNORMAL HIGH (ref 0.0–2.0)
Bicarbonate: 16.1 mmol/L — ABNORMAL LOW (ref 20.0–28.0)
FIO2: 0.21
MECHVT: 400 mL
O2 Saturation: 97.3 %
PATIENT TEMPERATURE: 37
PEEP: 5 cmH2O
RATE: 16 resp/min
pCO2 arterial: 26 mmHg — ABNORMAL LOW (ref 32.0–48.0)
pH, Arterial: 7.4 (ref 7.350–7.450)
pO2, Arterial: 94 mmHg (ref 83.0–108.0)

## 2018-07-17 LAB — GLUCOSE, CAPILLARY
GLUCOSE-CAPILLARY: 142 mg/dL — AB (ref 70–99)
Glucose-Capillary: 132 mg/dL — ABNORMAL HIGH (ref 70–99)
Glucose-Capillary: 110 mg/dL — ABNORMAL HIGH (ref 70–99)
Glucose-Capillary: 117 mg/dL — ABNORMAL HIGH (ref 70–99)
Glucose-Capillary: 143 mg/dL — ABNORMAL HIGH (ref 70–99)
Glucose-Capillary: 150 mg/dL — ABNORMAL HIGH (ref 70–99)

## 2018-07-17 MED ORDER — POTASSIUM CHLORIDE 20 MEQ/15ML (10%) PO SOLN
40.0000 meq | Freq: Once | ORAL | Status: AC
  Administered 2018-07-17: 40 meq
  Filled 2018-07-17: qty 30

## 2018-07-17 MED ORDER — CALCIUM GLUCONATE-NACL 1-0.675 GM/50ML-% IV SOLN
1.0000 g | Freq: Once | INTRAVENOUS | Status: AC
  Administered 2018-07-17: 1000 mg via INTRAVENOUS
  Filled 2018-07-17: qty 50

## 2018-07-17 NOTE — Progress Notes (Signed)
Subjective  - POD #1, s/p R BKA  Remains intubated and sedated   Physical Exam:  Dressing to stump changed.  All tissue appears viable with no evidence of infection       Assessment/Plan:    Preliminary cx show GNR, Remains on broad abd Will need conversion to AKA once she stabilizes  Melissa [name] 07/17/2018 1:27 PM --  Vitals:   07/17/18 1151 07/17/18 1200  BP:  103/64  Pulse:  (!) 109  Resp:  (!) 25  Temp:  97.8 F (36.6 C)  SpO2: 96% 97%    Intake/Output Summary (Last 24 hours) at 07/17/2018 1327 Last data filed at 07/17/2018 1200 Gross per 24 hour  Intake 1799.54 ml  Output 1050 ml  Net 749.54 ml     Laboratory CBC    Component Value Date/Time   WBC 29.4 (H) 07/17/2018 0407   HGB 7.7 (L) 07/17/2018 0407   HGB 11.6 (L) 06/16/2013 1031   HCT 24.4 (L) 07/17/2018 0407   HCT 34.8 (L) 06/16/2013 1031   PLT 312 07/17/2018 0407   PLT 226 06/16/2013 1031    BMET    Component Value Date/Time   NA 148 (H) 07/17/2018 0959   NA 139 06/16/2013 1031   K 3.4 (L) 07/17/2018 0959   K 3.8 06/16/2013 1031   CL 124 (H) 07/17/2018 0959   CL 105 06/16/2013 1031   CO2 17 (L) 07/17/2018 0959   CO2 30 06/16/2013 1031   GLUCOSE 126 (H) 07/17/2018 0959   GLUCOSE 96 06/16/2013 1031   BUN 51 (H) 07/17/2018 0959   BUN 23 (H) 06/16/2013 1031   CREATININE 0.66 07/17/2018 0959   CREATININE 0.58 (L) 06/16/2013 1031   CALCIUM 6.5 (L) 07/17/2018 0959   CALCIUM 9.7 06/16/2013 1031   GFRNONAA >60 07/17/2018 0959   GFRNONAA >60 06/16/2013 1031   GFRAA >60 07/17/2018 0959   GFRAA >60 06/16/2013 1031    COAG Lab Results  Component Value Date   INR 1.08 07/01/2018   INR 1.24 06/27/2018   INR 1.12 02/08/2018   No results found for: PTT  Antibiotics Anti-infectives (From admission, onward)   Start     Dose/Rate Route Frequency Ordered Stop   07/17/18 0000  vancomycin (VANCOCIN) IVPB 750 mg/150 ml premix  Status:  Discontinued     750 mg 150 mL/hr over 60 Minutes  Intravenous Every 48 hours 07/14/2018 1433 06/19/2018 1020   07/14/2018 1030  cefTRIAXone (ROCEPHIN) 2 g in sodium chloride 0.9 % 100 mL IVPB     2 g 200 mL/hr over 30 Minutes Intravenous Daily 06/20/2018 1020     07/11/2018 1000  ceFEPIme (MAXIPIME) 1 g in sodium chloride 0.9 % 100 mL IVPB  Status:  Discontinued     1 g 200 mL/hr over 30 Minutes Intravenous Every 12 hours 06/19/2018 1433 07/02/2018 0346   07/14/2018 0400  ceFEPIme (MAXIPIME) 2 g in sodium chloride 0.9 % 100 mL IVPB  Status:  Discontinued     2 g 200 mL/hr over 30 Minutes Intravenous Every 12 hours 06/25/2018 0346 06/26/2018 1020   07/04/2018 1300  ceFEPIme (MAXIPIME) 2 g in sodium chloride 0.9 % 100 mL IVPB     2 g 200 mL/hr over 30 Minutes Intravenous  Once 06/16/2018 1252 07/08/2018 1403   07/14/2018 1300  metroNIDAZOLE (FLAGYL) IVPB 500 mg  Status:  Discontinued     500 mg 100 mL/hr over 60 Minutes Intravenous Every 8 hours 06/27/2018 1252 06/26/2018 1020  06/24/2018 1300  vancomycin (VANCOCIN) IVPB 1000 mg/200 mL premix     1,000 mg 200 mL/hr over 60 Minutes Intravenous  Once 07/13/2018 1252 07/07/2018 1437       V. Leia Alf, M.D. [facility] Office: [phone] Pager:  [phone]

## 2018-07-17 NOTE — Progress Notes (Signed)
Follow up - Critical Care Medicine Note  Patient Details:    Melissa [name] is an 78 y.o. 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  Found to be unresponsive, hypotensive requiring central line placement, fluid resuscitation, intubation, pressors and broad-spectrum antibiotic coverage.  Has been seen by vascular surgery who plans on performing a debridement.    Lines, Airways, Drains: Airway 7 mm (Active)  Secured at (cm) 21 cm 07/13/2018  3:07 AM  Measured From Lips 07/10/2018  3:07 AM  Secured Location Center 06/17/2018  3:07 AM  Secured By [employer] 07/05/2018  3:07 AM  Tube Holder Repositioned Yes 07/09/2018  3:07 AM  Cuff Pressure (cm H2O) 26 cm H2O 06/16/2018  7:37 PM  Site Condition Dry 06/22/2018  7:37 PM     CVC Triple Lumen 06/28/2018 Right Internal jugular (Active)  Indication for Insertion or Continuance of Line Vasoactive infusions 07/14/2018  9:00 PM  Site Assessment Clean;Dry;Intact 07/13/2018  9:00 PM  Proximal Lumen Status Infusing 06/17/2018  9:00 PM  Medial Lumen Status Infusing 07/05/2018  9:00 PM  Distal Lumen Status Infusing 07/06/2018  9:00 PM  Dressing Type Transparent 06/18/2018  9:00 PM  Dressing Status Clean;Dry;Intact 06/16/2018  9:00 PM  Line Care Zeroed and calibrated 06/25/2018  9:00 PM     Urethral Catheter Olen Pel Double-lumen;Non-latex 14 Fr. (Active)  Indication for Insertion or Continuance of Catheter Unstable critical patients (first 24-48 hours) 07/14/2018  4:00 AM  Site Assessment Clean;Intact 07/01/2018  4:00 AM  Catheter Maintenance Bag below level of bladder;Catheter secured;Drainage bag/tubing not touching  floor;Insertion date on drainage bag;No dependent loops;Seal intact;Bag emptied prior to transport 06/27/2018  5:10 PM  Collection Container Standard drainage bag 07/11/2018  5:10 PM  Urinary Catheter Interventions Unclamped 07/13/2018  5:10 PM  Output (mL) 400 mL 07/03/2018  1:24 AM    Anti-infectives:  Anti-infectives (From admission, onward)   Start     Dose/Rate Route Frequency Ordered Stop   07/17/18 0000  vancomycin (VANCOCIN) IVPB 750 mg/150 ml premix  Status:  Discontinued     750 mg 150 mL/hr over 60 Minutes Intravenous Every 48 hours 06/26/2018 1433 07/14/2018 1020   07/12/2018 1030  cefTRIAXone (ROCEPHIN) 2 g in sodium chloride 0.9 % 100 mL IVPB     2 g 200 mL/hr over 30 Minutes Intravenous Daily 06/24/2018 1020     06/19/2018 1000  ceFEPIme (MAXIPIME) 1 g in sodium chloride 0.9 % 100 mL IVPB  Status:  Discontinued     1 g 200 mL/hr over 30 Minutes Intravenous Every 12 hours 07/12/2018 1433 07/04/2018 0346   07/06/2018 0400  ceFEPIme (MAXIPIME) 2 g in sodium chloride 0.9 % 100 mL IVPB  Status:  Discontinued     2 g 200 mL/hr over 30 Minutes Intravenous Every 12 hours 06/17/2018 0346 06/22/2018 1020   07/07/2018 1300  ceFEPIme (MAXIPIME) 2 g in sodium chloride 0.9 % 100 mL IVPB     2 g 200 mL/hr over 30 Minutes Intravenous  Once 07/09/2018 1252 06/16/2018 1403   06/18/2018 1300  metroNIDAZOLE (FLAGYL) IVPB 500 mg  Status:  Discontinued     500 mg 100 mL/hr over 60 Minutes Intravenous Every 8 hours 07/04/2018 1252 06/28/2018 1020   06/30/2018 1300  vancomycin (VANCOCIN)  IVPB 1000 mg/200 mL premix     1,000 mg 200 mL/hr over 60 Minutes Intravenous  Once 07/10/2018 1252 07/06/2018 1437      Microbiology: Results for orders placed or performed during the hospital encounter of 06/20/2018  Blood Culture (routine x 2)     Status: None (Preliminary result)   Collection Time: 07/01/2018 12:52 PM  Result Value Ref Range Status   Specimen Description   Final    BLOOD RIGHT HAND Performed at [facility], [address] [place], [place] [address]    Special Requests   Final    BOTTLES DRAWN AEROBIC AND ANAEROBIC Blood Culture adequate volume Performed at [facility], [address], [place], [place] [address]    Culture  Setup Time   Final    GRAM NEGATIVE RODS IN BOTH AEROBIC AND ANAEROBIC BOTTLES CRITICAL RESULT CALLED TO, READ BACK BY AND VERIFIED WITH: DAVID BESANTI 07/02/2018 @ 0236  [place] Performed at [facility], [address], [place], [place] [address]    Culture GRAM NEGATIVE RODS  Final   Report Status PENDING  Incomplete  Blood Culture (routine x 2)     Status: None (Preliminary result)   Collection Time: 07/12/2018 12:52 PM  Result Value Ref Range Status   Specimen Description BLOOD BLOOD LEFT HAND  Final   Special Requests   Final    BOTTLES DRAWN AEROBIC AND ANAEROBIC Blood Culture adequate volume   Culture  Setup Time   Final    GRAM NEGATIVE RODS IN BOTH AEROBIC AND ANAEROBIC BOTTLES CRITICAL RESULT CALLED TO, READ BACK BY AND VERIFIED WITH: [place] ON 07/08/2018 [place] Performed at [facility], [address] [place], [place] [address]    Culture GRAM NEGATIVE RODS  Final   Report Status PENDING  Incomplete  Blood Culture ID Panel (Reflexed)     Status: Abnormal   Collection Time: 07/14/2018 12:52 PM  Result Value Ref Range Status   Enterococcus species NOT DETECTED NOT DETECTED Final   Listeria monocytogenes NOT DETECTED NOT DETECTED Final   Staphylococcus species NOT DETECTED NOT DETECTED Final   Staphylococcus aureus (BCID) NOT DETECTED NOT DETECTED Final   Streptococcus species NOT DETECTED NOT DETECTED Final   Streptococcus agalactiae NOT DETECTED NOT DETECTED Final   Streptococcus pneumoniae NOT DETECTED NOT DETECTED Final   Streptococcus pyogenes NOT DETECTED NOT DETECTED Final   Acinetobacter baumannii NOT DETECTED NOT DETECTED Final   Enterobacteriaceae species DETECTED (A) NOT DETECTED Final    Comment:  Enterobacteriaceae represent a large family of gram-negative bacteria, not a single organism. CRITICAL RESULT CALLED TO, READ BACK BY AND VERIFIED WITH: DAVID BESANTI 07/02/2018 @ 0236 [place]    Enterobacter cloacae complex NOT DETECTED NOT DETECTED Final   Escherichia coli NOT DETECTED NOT DETECTED Final   Klebsiella oxytoca NOT DETECTED NOT DETECTED Final   Klebsiella pneumoniae NOT DETECTED NOT DETECTED Final   Proteus species DETECTED (A) NOT DETECTED Final    Comment: CRITICAL RESULT CALLED TO, READ BACK BY AND VERIFIED WITH: DAVID BESANTI 06/25/2018 @ 0236  MLK    Serratia marcescens NOT DETECTED NOT DETECTED Final   Carbapenem resistance NOT DETECTED NOT DETECTED Final   Haemophilus influenzae NOT DETECTED NOT DETECTED Final   Neisseria meningitidis NOT DETECTED NOT DETECTED Final   Pseudomonas aeruginosa NOT DETECTED NOT DETECTED Final   Candida albicans NOT DETECTED NOT DETECTED Final   Candida glabrata NOT DETECTED NOT DETECTED Final   Candida  krusei NOT DETECTED NOT DETECTED Final   Candida parapsilosis NOT DETECTED NOT DETECTED Final   Candida tropicalis NOT DETECTED NOT DETECTED Final    Comment: Performed at [facility], [address], [place], [place] [address]  Urine culture     Status: None   Collection Time: 07/06/2018  1:16 PM  Result Value Ref Range Status   Specimen Description   Final    URINE, CLEAN CATCH Performed at [facility], [address] [place], [place] [address]    Special Requests   Final    NONE Performed at [facility], [address], [place], [place] [address]    Culture   Final    NO GROWTH Performed at [facility], [place] [address] [place], [place] [address]    Report Status 07/02/2018 FINAL  Final  MRSA PCR Screening     Status: None   Collection Time: 06/30/2018  4:59 PM  Result Value Ref Range Status   MRSA by PCR NEGATIVE NEGATIVE Final    Comment:        The GeneXpert MRSA Assay (FDA approved for  NASAL specimens only), is one component of a comprehensive MRSA colonization surveillance program. It is not intended to diagnose MRSA infection nor to guide or monitor treatment for MRSA infections. Performed at [facility], [address] [place], [place] [address]   Aerobic/Anaerobic Culture (surgical/deep wound)     Status: None (Preliminary result)   Collection Time: 06/17/2018  2:17 PM  Result Value Ref Range Status   Specimen Description   Final    KNEE BELOW RIGHT Performed at [facility], [address], [place], [place] [address]    Special Requests   Final    NONE Performed at [facility], [address] [place], [place] [address]    Gram Stain   Final    RARE WBC PRESENT, PREDOMINANTLY PMN RARE GRAM NEGATIVE RODS Performed at [facility], [place] [address], [place], [place] [address]    Culture PENDING  Incomplete   Report Status PENDING  Incomplete   Studies: Dg Wrist 2 Views Right  Result Date: 07/05/2018 CLINICAL DATA:  Fall, landed on right wrist.  Pain EXAM: RIGHT WRIST - 2 VIEW COMPARISON:  None. 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 Diffuse vascular calcifications. No fracture. Slight widening of the scapholunate distance. IMPRESSION: Degenerative changes in the right wrist.  No fracture. Slight widening of the scapholunate distance could reflect scapholunate dissociation. Electronically Signed   By: Rolm Baptise M.D.   On: 07/05/2018 10:35   Dg Knee 2 Views Right  Result Date: 07/12/2018 CLINICAL DATA:  Right below-knee amputation stump infection. EXAM: RIGHT KNEE - 1-2 VIEW COMPARISON:  None. FINDINGS: Status post right below-knee amputation. Vascular calcifications are noted no lytic defect is seen. However, small amount of gas is noted in the distal soft tissues of the stump suggesting infection. IMPRESSION: Small amount of soft tissue gas seen in distal portion  of BKA stump suggesting infection. Electronically Signed   By: Marijo Conception, M.D.   On: 06/23/2018 13:31   Dg Abd 1 View  Result Date: 07/04/2018 CLINICAL DATA:  OG placement EXAM: ABDOMEN - 1 VIEW COMPARISON:  CT 01/19/2017 FINDINGS: Esophageal tube tip overlies the gastric body. Visible upper gas pattern is unobstructed. Surgical hardware in the lower spine. IMPRESSION: Esophageal tube tip overlies the mid abdomen, presumably over the gastric body.  Electronically Signed   By: Donavan Foil M.D.   On: 06/20/2018 18:25   Ct Head Wo Contrast  Result Date: 07/05/2018 CLINICAL DATA:  Fall EXAM: CT HEAD WITHOUT CONTRAST TECHNIQUE: Contiguous axial images were obtained from the base of the skull through the vertex without intravenous contrast. COMPARISON:  06/08/2017 FINDINGS: Brain: Mild global atrophy appropriate to age. Minimal chronic ischemic changes in the periventricular white matter. There is no mass effect, midline shift, or acute intracranial hemorrhage. Vascular: No hyperdense vessel or unexpected calcification. Skull: The cranium is intact. Sinuses/Orbits: Visualized mastoid air cells and paranasal sinuses are clear. Orbits are grossly within normal limits. Other: Soft tissue hematoma over the right temporal bone is noted. IMPRESSION: No acute intracranial pathology. Electronically Signed   By: Marybelle Killings M.D.   On: 07/05/2018 09:03   Dg Chest Port 1 View  Result Date: 07/17/2018 CLINICAL DATA:  Acute respiratory failure. EXAM: PORTABLE CHEST 1 VIEW COMPARISON:  07/04/2018 FINDINGS: The patient is rotated to the right. Endotracheal tube terminates 4.5 cm above the carina. Right jugular catheter terminates over the mid to lower SVC. Enteric tube courses into the left upper abdomen with tip not imaged. The cardiomediastinal silhouette is unchanged with normal heart size. Basilar predominant interstitial densities in both lungs on the prior study have improved. There are likely small persistent  right and possibly left pleural effusions. No pneumothorax is identified. IMPRESSION: Improved bilateral lung aeration. Electronically Signed   By: Logan Bores M.D.   On: 07/17/2018 07:00   Dg Chest Port 1 View  Result Date: 07/12/2018 CLINICAL DATA:  Intubation. EXAM: PORTABLE CHEST 1 VIEW COMPARISON:  06/16/2018. FINDINGS: Interim placement of NG tube, its tip is below left hemidiaphragm. Endotracheal tube, right IJ line stable position. Heart size normal. Bibasilar. Bibasilar infiltrates/edema. Small bilateral pleural effusions. IMPRESSION: 1. Interim placement of NG tube, its tip is below left hemidiaphragm. Endotracheal tube and right IJ line stable position. 2. Bibasilar atelectasis and bibasilar infiltrates/edema. Small bilateral pleural effusions. Electronically Signed   By: Marcello Moores  Register   On: 06/25/2018 09:06   Dg Chest Portable 1 View  Result Date: 07/14/2018 CLINICAL DATA:  ET tube insertion EXAM: PORTABLE CHEST 1 VIEW COMPARISON:  06/18/2018 FINDINGS: Endotracheal tube with the tip 3.3 cm above the carina. Right jugular central venous catheter with the tip projecting over the SVC. Chronic elevation of the right diaphragm. Mild hazy left basilar airspace disease likely reflecting atelectasis. No pleural effusion or pneumothorax. Stable cardiomediastinal silhouette. No aggressive osseous lesion. IMPRESSION: Endotracheal tube with the tip 3.3 cm above the carina. Right jugular central venous catheter with the tip projecting over the SVC. Electronically Signed   By: Kathreen Devoid   On: 06/24/2018 16:10   Dg Chest Portable 1 View  Result Date: 06/30/2018 CLINICAL DATA:  Central line placement EXAM: PORTABLE CHEST 1 VIEW COMPARISON:  06/20/2018 FINDINGS: Right central line is in place with the tip at the cavoatrial junction. No pneumothorax. Bibasilar atelectasis. Mild vascular congestion. Suspect small effusions. No acute bony abnormality. IMPRESSION: Right central line tip at the  cavoatrial junction.  No pneumothorax. Vascular congestion, bibasilar atelectasis and possible small effusions. Electronically Signed   By: Rolm Baptise M.D.   On: 07/14/2018 15:07   Dg Chest Port 1 View  Result Date: 06/18/2018 CLINICAL DATA:  Recent right leg amputation.  Possible sepsis. EXAM: PORTABLE CHEST 1 VIEW COMPARISON:  07/04/2018 FINDINGS: Heart is normal size. Bibasilar atelectasis or infiltrates noted. Mild elevation of the right hemidiaphragm.  No visible effusions or acute bony abnormality. IMPRESSION: Bibasilar atelectasis or infiltrates. Mild elevation of the right hemidiaphragm. Electronically Signed   By: Rolm Baptise M.D.   On: 07/10/2018 13:24   Dg Chest Port 1 View  Result Date: 07/04/2018 CLINICAL DATA:  Chest pain EXAM: PORTABLE CHEST 1 VIEW COMPARISON:  12/14/2017 FINDINGS: Heart is normal size. Chronic increased markings throughout the lungs, likely scarring. No effusions or confluent opacities. No acute bony abnormality. IMPRESSION: Chronic changes.  No active disease. Electronically Signed   By: Rolm Baptise M.D.   On: 07/04/2018 11:07    Consults: Treatment Team:  Pccm, Ander Gaster, MD Algernon Huxley, MD   Subjective:    Overnight Issues: Overnight patient's hemodynamics have been somewhat stabilized, presently on vasopressin and Neo-Synephrine.  Amiodarone started this morning for atrial fibrillation.  Remains sedated on propofol  Objective:  Vital signs for last 24 hours: Temp:  [97.5 F (36.4 C)-98 F (36.7 C)] 98 F (36.7 C) (01/01 0400) Pulse Rate:  [63-117] 83 (01/01 0600) Resp:  [15-30] 23 (01/01 0600) BP: (80-118)/(54-92) 99/61 (01/01 0600) SpO2:  [93 %-98 %] 98 % (01/01 0600) FiO2 (%):  [21 %] 21 % (01/01 0319)  Hemodynamic parameters for last 24 hours: CVP:  [6 mmHg-13 mmHg] 6 mmHg  Intake/Output from previous day: 12/31 0701 - 01/01 0700 In: 2191.3 [I.V.:2191.3] Out: 1670 [Urine:1495; Emesis/NG output:125; Stool:50]  Intake/Output this  shift: No intake/output data recorded.  Vent settings for last 24 hours: Vent Mode: PRVC FiO2 (%):  [21 %] 21 % Set Rate:  [16 bmp] 16 bmp Vt Set:  [400 mL] 400 mL PEEP:  [5 cmH20] 5 cmH20 Plateau Pressure:  [11 cmH20-13 cmH20] 11 cmH20  Physical Exam:  Vital signs:       Please see the above listed vital signs Patient is unresponsive, on mechanical ventilation, norepinephrine, tachycardic with a pulse of 140 and marginal blood pressures HEENT:           Patient is orally intubated, trachea midline, no thyromegaly appreciated, right internal jugular central line has been placed Cardiovascular:           Tachycardia, appears to be sinus tachycardia with a rate of 1 30-1 40 Pulmonary:      Rhonchi appreciated diffusely Abdominal:      Positive bowel sounds, soft exam Extremities:     Fluctuance noted in the right BKA with some demarcation and skin with foul-smelling drainage Neurologic:      Patient is on sedation status post intubation limited exam at this time  Assessment/Plan:  Septic shock.    Source most likely from BKA, status post debridement yesterday in operating room.  IMA dynamics have improved and weaning vasopressors presently only on low-dose Neo-Synephrine.  Presently on Rocephin secondary to Proteus sepsis.  Also on hydrocortisone empirically for adrenal stress  Acid-base derangement.    Improved.  pH is 7.4, CO2 is up to 16 with normal anion gap  Hypokalemia.  Will replace   Leukocytosis.  Has improved, decreased to 29.4  anemia.    Hemoglobin 7.7.  Slow decrease, may require transfusion if hemoglobin drops below 7  Prerenal azotemia.  Improved.  BUN is 55 with a creatinine 1.65  Elevated troponin at 0.09.  Most likely reflect supply demand mismatch and secondary to hypotension.  We will continue mechanical ventilation today, wean pressors as tolerated, will touch base with vascular surgery to see if any further operative interventions.  May consider weaning  trials tomorrow  Critical Care Total Time 35  Sophia Cubero 07/17/2018  *Care during the described time interval was provided by me and/or other providers on the critical care team.  I have reviewed this patient's available data, including medical history, events of note, physical examination and test results as part of my evaluation. Patient ID: EMMAGENE ORTNER, female   DOB: 1940-11-07, 78 y.o.   MRN: [id number]

## 2018-07-17 NOTE — Progress Notes (Signed)
[facility] [place] NAME: Melissa [name]    MR#:  [id number]  DATE OF BIRTH:  March 23, 1941  CC:  Patient with recent right BKA for severe PAD was sent to rehab less than a week ago brought back in for unresponsive episode and noted to be septic and infection at the stump site. Intubated and sedated, requiring pressor support, case discussed with intensivist, vascular surgery planning for conversion to AKA once stable   REVIEW OF SYSTEMS:  Review of Systems  Unable to perform ROS: Critical illness    DRUG ALLERGIES:   Allergies  Allergen Reactions  . Lodine [Etodolac] Hives and Itching  . Methotrexate Derivatives Other (See Comments)    Flu like symptoms  . Nitrofuran Derivatives Nausea And Vomiting and Other (See Comments)    Made sicker   . Levaquin [Levofloxacin] Itching, Rash and Other (See Comments)    Muscle pain  . Norvasc [Amlodipine] Other (See Comments)    Unknown   . Pentasa [Mesalamine Er] Other (See Comments)    Unknown   . Sulfa Antibiotics Itching and Rash  . Vioxx [Rofecoxib] Other (See Comments)    Unknown     VITALS:  Blood pressure 108/86, pulse (!) 116, temperature 97.8 F (36.6 C), temperature source Oral, resp. rate 20, height 5' 4"  (1.626 m), weight 48.6 kg, SpO2 97 %.  PHYSICAL EXAMINATION:  Physical Exam   GENERAL:  78 y.o.-year-old patient lying in the bed, debated on the ventilator.  Critically ill-appearing EYES: Pupils equal, round, reactive to light and accommodation. No scleral icterus. Extraocular muscles intact.  HEENT: Head atraumatic, normocephalic. Oropharynx and nasopharynx clear.  NECK:  Supple, no jugular venous distention. No thyroid enlargement, no tenderness.  LUNGS: Normal breath sounds bilaterally, no wheezing, rales,rhonchi or crepitation. No use of accessory muscles of respiration.  Decreased bibasilar breath sounds CARDIOVASCULAR: S1, S2 normal. No  rubs, or gallops.  2/6 systolic  murmur is present ABDOMEN: Soft, nontender, nondistended. Bowel sounds present. No organomegaly or mass.  EXTREMITIES: No pedal edema, cyanosis, or clubbing.  NEUROLOGIC: Intubated and sedated.  Not responding PSYCHIATRIC: The patient is sedated.  SKIN: No obvious rash, lesion, or ulcer.    LABORATORY PANEL:   CBC Recent Labs  Lab 07/17/18 0407  WBC 29.4*  HGB 7.7*  HCT 24.4*  PLT 312   ------------------------------------------------------------------------------------------------------------------  Chemistries  Recent Labs  Lab 06/16/2018 0402  07/17/18 0959  NA 145   < > 148*  K 4.2   < > 3.4*  CL 115*   < > 124*  CO2 17*   < > 17*  GLUCOSE 207*   < > 126*  BUN 76*   < > 51*  CREATININE 1.05*   < > 0.66  CALCIUM 6.7*   < > 6.5*  AST 66*  --   --   ALT 46*  --   --   ALKPHOS 117  --   --   BILITOT 0.9  --   --    < > = values in this interval not displayed.   ------------------------------------------------------------------------------------------------------------------  Cardiac Enzymes Recent Labs  Lab 07/02/2018 1935  TROPONINI 0.07*   ------------------------------------------------------------------------------------------------------------------  RADIOLOGY:  Dg Abd 1 View  Result Date: 06/16/2018 CLINICAL DATA:  OG placement EXAM: ABDOMEN - 1 VIEW COMPARISON:  CT 01/19/2017 FINDINGS: Esophageal tube tip overlies the gastric body. Visible upper gas pattern is unobstructed. Surgical hardware in the lower spine. IMPRESSION: Esophageal tube tip overlies the  mid abdomen, presumably over the gastric body. Electronically Signed   By: Donavan Foil M.D.   On: 06/21/2018 18:25   Dg Chest Port 1 View  Result Date: 07/17/2018 CLINICAL DATA:  Acute respiratory failure. EXAM: PORTABLE CHEST 1 VIEW COMPARISON:  07/08/2018 FINDINGS: The patient is rotated to the right. Endotracheal tube terminates 4.5 cm above the carina. Right jugular catheter terminates over the mid  to lower SVC. Enteric tube courses into the left upper abdomen with tip not imaged. The cardiomediastinal silhouette is unchanged with normal heart size. Basilar predominant interstitial densities in both lungs on the prior study have improved. There are likely small persistent right and possibly left pleural effusions. No pneumothorax is identified. IMPRESSION: Improved bilateral lung aeration. Electronically Signed   By: Logan Bores M.D.   On: 07/17/2018 07:00   Dg Chest Port 1 View  Result Date: 07/06/2018 CLINICAL DATA:  Intubation. EXAM: PORTABLE CHEST 1 VIEW COMPARISON:  07/06/2018. FINDINGS: Interim placement of NG tube, its tip is below left hemidiaphragm. Endotracheal tube, right IJ line stable position. Heart size normal. Bibasilar. Bibasilar infiltrates/edema. Small bilateral pleural effusions. IMPRESSION: 1. Interim placement of NG tube, its tip is below left hemidiaphragm. Endotracheal tube and right IJ line stable position. 2. Bibasilar atelectasis and bibasilar infiltrates/edema. Small bilateral pleural effusions. Electronically Signed   By: Marcello Moores  Register   On: 06/21/2018 09:06   Dg Chest Portable 1 View  Result Date: 07/01/2018 CLINICAL DATA:  ET tube insertion EXAM: PORTABLE CHEST 1 VIEW COMPARISON:  06/19/2018 FINDINGS: Endotracheal tube with the tip 3.3 cm above the carina. Right jugular central venous catheter with the tip projecting over the SVC. Chronic elevation of the right diaphragm. Mild hazy left basilar airspace disease likely reflecting atelectasis. No pleural effusion or pneumothorax. Stable cardiomediastinal silhouette. No aggressive osseous lesion. IMPRESSION: Endotracheal tube with the tip 3.3 cm above the carina. Right jugular central venous catheter with the tip projecting over the SVC. Electronically Signed   By: Kathreen Devoid   On: 06/19/2018 16:10    EKG:   Orders placed or performed during the hospital encounter of 06/17/2018  . ED EKG 12-Lead  . ED EKG  12-Lead  . EKG 12-Lead  . 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 less than 2 weeks ago discharged to rehab presents with sepsis  *Acute septic shock  Secondary to infected stump Continue vasopressor support with weaning as tolerated, empiric IV Rocephin, IV fluids for rehydration   *Chronic PAD, left BKA stump infection  Vascular surgery input appreciated-we will need conversion to AKA once stable   *Acute metabolic acidosis Secondary to sepsis Continue IV fluids for rehydration  *Anemia of chronic disease No acute indication for transfusion at this time  *Elevated troponin likely demand ischemia continue to monitor cardiac enzymes  *A. fib with RVR amiodarone drip   All the records are reviewed and case discussed with Care Management/Social Workerr. Management plans discussed with the patient, family and they are in agreement.  CODE STATUS: Full Code  TOTAL TIME TAKING CARE OF THIS PATIENT: 38 minutes.   POSSIBLE D/C IN ? DAYS, DEPENDING ON CLINICAL CONDITION.   [name] Ramonia Mcclaran M.D on 07/17/2018 at 3:16 PM  Between 7am to 6pm - Pager - [phone]  After 6pm go to [email or web address] - password [email or web address]  [employer]  [phone]  CC: Primary care physician; [name], MD

## 2018-07-17 DEATH — deceased

## 2018-07-18 ENCOUNTER — Inpatient Hospital Stay: Payer: Medicare HMO

## 2018-07-18 LAB — BASIC METABOLIC PANEL
Anion gap: 8 (ref 5–15)
BUN: 46 mg/dL — AB (ref 8–23)
CO2: 15 mmol/L — ABNORMAL LOW (ref 22–32)
Calcium: 7 mg/dL — ABNORMAL LOW (ref 8.9–10.3)
Chloride: 124 mmol/L — ABNORMAL HIGH (ref 98–111)
Creatinine, Ser: 0.58 mg/dL (ref 0.44–1.00)
GFR calc Af Amer: >60 mL/min (ref >60)
GFR calc non Af Amer: >60 mL/min (ref >60)
GLUCOSE: 153 mg/dL — AB (ref 70–99)
Potassium: 3.1 mmol/L — ABNORMAL LOW (ref 3.5–5.1)
Sodium: 147 mmol/L — ABNORMAL HIGH (ref 135–145)

## 2018-07-18 LAB — CULTURE, BLOOD (ROUTINE X 2)
Special Requests: ADEQUATE
Special Requests: ADEQUATE

## 2018-07-18 LAB — GLUCOSE, CAPILLARY
GLUCOSE-CAPILLARY: 171 mg/dL — AB (ref 70–99)
Glucose-Capillary: 135 mg/dL — ABNORMAL HIGH (ref 70–99)
Glucose-Capillary: 138 mg/dL — ABNORMAL HIGH (ref 70–99)
Glucose-Capillary: 139 mg/dL — ABNORMAL HIGH (ref 70–99)
Glucose-Capillary: 144 mg/dL — ABNORMAL HIGH (ref 70–99)
Glucose-Capillary: 176 mg/dL — ABNORMAL HIGH (ref 70–99)

## 2018-07-18 LAB — CBC WITH DIFFERENTIAL/PLATELET
Abs Immature Granulocytes: 1.38 10*3/uL — ABNORMAL HIGH (ref 0.00–0.07)
Basophils Absolute: 0.1 10*3/uL (ref 0.0–0.1)
Basophils Relative: 0 %
Eosinophils Absolute: 0.1 10*3/uL (ref 0.0–0.5)
Eosinophils Relative: 0 %
HCT: 25.9 % — ABNORMAL LOW (ref 36.0–46.0)
HEMOGLOBIN: 8.2 g/dL — AB (ref 12.0–15.0)
Immature Granulocytes: 4 %
Lymphocytes Relative: 2 %
Lymphs Abs: 0.6 10*3/uL — ABNORMAL LOW (ref 0.7–4.0)
MCH: 30.4 pg (ref 26.0–34.0)
MCHC: 31.7 g/dL (ref 30.0–36.0)
MCV: 95.9 fL (ref 80.0–100.0)
MONO ABS: 1.1 10*3/uL — AB (ref 0.1–1.0)
Monocytes Relative: 4 %
Neutro Abs: 28.2 10*3/uL — ABNORMAL HIGH (ref 1.7–7.7)
Neutrophils Relative %: 90 %
Platelets: 299 10*3/uL (ref 150–400)
RBC: 2.7 MIL/uL — ABNORMAL LOW (ref 3.87–5.11)
RDW: 15.5 % (ref 11.5–15.5)
WBC: 31.4 10*3/uL — ABNORMAL HIGH (ref 4.0–10.5)
nRBC: 0.2 % (ref 0.0–0.2)

## 2018-07-18 MED ORDER — DILTIAZEM HCL 25 MG/5ML IV SOLN
INTRAVENOUS | Status: AC
  Filled 2018-07-18: qty 5

## 2018-07-18 MED ORDER — FENTANYL 2500MCG IN NS 250ML (10MCG/ML) PREMIX INFUSION
INTRAVENOUS | Status: AC
  Filled 2018-07-18: qty 250

## 2018-07-18 MED ORDER — DILTIAZEM HCL ER COATED BEADS 240 MG PO CP24
240.0000 mg | ORAL_CAPSULE | Freq: Every day | ORAL | Status: DC
  Administered 2018-07-19: 240 mg via ORAL
  Filled 2018-07-18 (×2): qty 1

## 2018-07-18 MED ORDER — DILTIAZEM HCL 25 MG/5ML IV SOLN
10.0000 mg | Freq: Once | INTRAVENOUS | Status: AC
  Administered 2018-07-18: 10 mg via INTRAVENOUS

## 2018-07-18 MED ORDER — HEPARIN SODIUM (PORCINE) 5000 UNIT/ML IJ SOLN
5000.0000 [IU] | Freq: Three times a day (TID) | INTRAMUSCULAR | Status: DC
  Administered 2018-07-18 – 2018-07-19 (×4): 5000 [IU] via SUBCUTANEOUS
  Filled 2018-07-18 (×4): qty 1

## 2018-07-18 MED ORDER — AMIODARONE IV BOLUS ONLY 150 MG/100ML
150.0000 mg | Freq: Once | INTRAVENOUS | Status: AC
  Administered 2018-07-18: 150 mg via INTRAVENOUS

## 2018-07-18 MED ORDER — POTASSIUM CHLORIDE 20 MEQ PO PACK
40.0000 meq | PACK | ORAL | Status: AC
  Administered 2018-07-18 (×2): 40 meq
  Filled 2018-07-18 (×2): qty 2

## 2018-07-18 MED ORDER — AMIODARONE HCL IN DEXTROSE 360-4.14 MG/200ML-% IV SOLN
60.0000 mg/h | INTRAVENOUS | Status: AC
  Administered 2018-07-18 (×2): 60 mg/h via INTRAVENOUS
  Filled 2018-07-18: qty 200

## 2018-07-18 MED ORDER — FENTANYL 2500MCG IN NS 250ML (10MCG/ML) PREMIX INFUSION
0.0000 ug/h | INTRAVENOUS | Status: DC
  Administered 2018-07-18: 50 ug/h via INTRAVENOUS

## 2018-07-18 NOTE — Progress Notes (Signed)
[facility] at [place] NAME: Jaine Estabrooks    MR#:  [id number]  DATE OF BIRTH:  08/04/1940  CC:  Patient with recent right BKA for severe PAD was sent to rehab less than a week ago brought back in for unresponsive episode and noted to be septic and infection at the stump site. Intubated and sedated, continues to require pressor support, case discussed with intensivist-possible extubation later today, vascular surgery planning for conversion to AKA once stable   REVIEW OF SYSTEMS:  Review of Systems  Unable to perform ROS: Critical illness    DRUG ALLERGIES:   Allergies  Allergen Reactions  . Lodine [Etodolac] Hives and Itching  . Methotrexate Derivatives Other (See Comments)    Flu like symptoms  . Nitrofuran Derivatives Nausea And Vomiting and Other (See Comments)    Made sicker   . Levaquin [Levofloxacin] Itching, Rash and Other (See Comments)    Muscle pain  . Norvasc [Amlodipine] Other (See Comments)    Unknown   . Pentasa [Mesalamine Er] Other (See Comments)    Unknown   . Sulfa Antibiotics Itching and Rash  . Vioxx [Rofecoxib] Other (See Comments)    Unknown     VITALS:  Blood pressure (!) 132/92, pulse (!) 129, temperature 99.1 F (37.3 C), temperature source Oral, resp. rate (!) 23, height 5' 4"  (1.626 m), weight 48.6 kg, SpO2 97 %.  PHYSICAL EXAMINATION:  Physical Exam   GENERAL:  78 y.o.-year-old patient lying in the bed, debated on the ventilator.  Critically ill-appearing EYES: Pupils equal, round, reactive to light and accommodation. No scleral icterus. Extraocular muscles intact.  HEENT: Head atraumatic, normocephalic. Oropharynx and nasopharynx clear.  NECK:  Supple, no jugular venous distention. No thyroid enlargement, no tenderness.  LUNGS: Normal breath sounds bilaterally, no wheezing, rales,rhonchi or crepitation. No use of accessory muscles of respiration.  Decreased bibasilar breath sounds CARDIOVASCULAR:  S1, S2 normal. No  rubs, or gallops.  2/6 systolic murmur is present ABDOMEN: Soft, nontender, nondistended. Bowel sounds present. No organomegaly or mass.  EXTREMITIES: No pedal edema, cyanosis, or clubbing.  NEUROLOGIC: Intubated and sedated.  Not responding PSYCHIATRIC: The patient is sedated.  SKIN: No obvious rash, lesion, or ulcer.    LABORATORY PANEL:   CBC Recent Labs  Lab 07/18/18 0849  WBC 31.4*  HGB 8.2*  HCT 25.9*  PLT 299   ------------------------------------------------------------------------------------------------------------------  Chemistries  Recent Labs  Lab 06/30/2018 0402  07/18/18 0849  NA 145   < > 147*  K 4.2   < > 3.1*  CL 115*   < > 124*  CO2 17*   < > 15*  GLUCOSE 207*   < > 153*  BUN 76*   < > 46*  CREATININE 1.05*   < > 0.58  CALCIUM 6.7*   < > 7.0*  AST 66*  --   --   ALT 46*  --   --   ALKPHOS 117  --   --   BILITOT 0.9  --   --    < > = values in this interval not displayed.   ------------------------------------------------------------------------------------------------------------------  Cardiac Enzymes Recent Labs  Lab 07/03/2018 1935  TROPONINI 0.07*   ------------------------------------------------------------------------------------------------------------------  RADIOLOGY:  Dg Chest Port 1 View  Result Date: 07/18/2018 CLINICAL DATA:  Patient on ventilator EXAM: PORTABLE CHEST 1 VIEW COMPARISON:  Chest radiograph 07/17/2018 FINDINGS: Right IJ central venous catheter tip projects over the superior vena cava. ET tube terminates  in the mid trachea. Enteric tube courses inferior to the diaphragm. Monitoring leads overlie the patient. Stable cardiac and mediastinal contours. Interval improved aeration of the left lung base with minimal residual heterogeneous opacities left and right lung base. Probable small bilateral pleural effusions. IMPRESSION: Slight interval improvement in aeration of the lung bases with mild residual  basilar opacities and small effusions. ET tube mid trachea. Right IJ central venous catheter tip projects over the superior vena cava. Electronically Signed   By: Lovey Newcomer M.D.   On: 07/18/2018 10:24   Dg Chest Port 1 View  Result Date: 07/17/2018 CLINICAL DATA:  Acute respiratory failure. EXAM: PORTABLE CHEST 1 VIEW COMPARISON:  07/03/2018 FINDINGS: The patient is rotated to the right. Endotracheal tube terminates 4.5 cm above the carina. Right jugular catheter terminates over the mid to lower SVC. Enteric tube courses into the left upper abdomen with tip not imaged. The cardiomediastinal silhouette is unchanged with normal heart size. Basilar predominant interstitial densities in both lungs on the prior study have improved. There are likely small persistent right and possibly left pleural effusions. No pneumothorax is identified. IMPRESSION: Improved bilateral lung aeration. Electronically Signed   By: Logan Bores M.D.   On: 07/17/2018 07:00    EKG:   Orders placed or performed during the hospital encounter of 07/13/2018  . ED EKG 12-Lead  . ED EKG 12-Lead  . EKG 12-Lead  . 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 less than 2 weeks ago discharged to rehab presents with sepsis  *Acute septic shock  Secondary to infected stump, cannot exclude possible adrenal insufficiency-on IV steroids Continue sepsis protocol, vasopressor support with Neo-Synephrine/vasopressin w/ weaning as tolerated, empiric IV Rocephin, IV fluids for rehydration   *Chronic PAD, left BKA stump infection  Vascular surgery input appreciated- will need conversion to AKA once stable   *Acute metabolic acidosis Secondary to sepsis Continue IV fluids for rehydration  *Anemia of chronic disease No acute indication for transfusion at this time  *Elevated troponin likely demand  ischemia continue to monitor cardiac enzymes  *A. fib with RVR amiodarone drip   All the records are reviewed and case discussed with Care Management/Social Workerr. Management plans discussed with the patient, family and they are in agreement.  CODE STATUS: Full Code  TOTAL TIME TAKING CARE OF THIS PATIENT: 38 minutes.   POSSIBLE D/C IN ? DAYS, DEPENDING ON CLINICAL CONDITION.   [name] Emmily Pellegrin M.D on 07/18/2018 at 2:07 PM  Between 7am to 6pm - Pager - [phone]  After 6pm go to [email or web address] - password [email or web address] [facility]  Office  [phone]  CC: Primary care physician; [name], MD

## 2018-07-18 NOTE — Progress Notes (Signed)
Patient remains on vent.  Tolerating well. No acute distress noted. No change in status. Report given to oncoming nurse.  Will continue to monitor.

## 2018-07-18 NOTE — Progress Notes (Signed)
SAT/SBT failed due to resp muscle fatigue, increased WOB and elevated HR  Plan to re-sedate patient place on full vent support.

## 2018-07-18 NOTE — Progress Notes (Signed)
Follow up - Critical Care Medicine Note  Patient Details:    Melissa [name] is an 78 y.o. 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  Found to be unresponsive, hypotensive requiring central line placement, fluid resuscitation, intubation, pressors and broad-spectrum antibiotic coverage.  Has been seen by vascular surgery who plans on performing a debridement.    Lines, Airways, Drains: Airway 7 mm (Active)  Secured at (cm) 21 cm 06/16/2018  3:07 AM  Measured From Lips 07/13/2018  3:07 AM  Secured Location Center 06/25/2018  3:07 AM  Secured By [employer] 07/09/2018  3:07 AM  Tube Holder Repositioned Yes 07/06/2018  3:07 AM  Cuff Pressure (cm H2O) 26 cm H2O 07/03/2018  7:37 PM  Site Condition Dry 07/07/2018  7:37 PM     CVC Triple Lumen 06/19/2018 Right Internal jugular (Active)  Indication for Insertion or Continuance of Line Vasoactive infusions 06/17/2018  9:00 PM  Site Assessment Clean;Dry;Intact 07/08/2018  9:00 PM  Proximal Lumen Status Infusing 06/18/2018  9:00 PM  Medial Lumen Status Infusing 06/27/2018  9:00 PM  Distal Lumen Status Infusing 06/22/2018  9:00 PM  Dressing Type Transparent 07/04/2018  9:00 PM  Dressing Status Clean;Dry;Intact 07/07/2018  9:00 PM  Line Care Zeroed and calibrated 07/13/2018  9:00 PM     Urethral Catheter Olen Pel Double-lumen;Non-latex 14 Fr. (Active)  Indication for Insertion or Continuance of Catheter Unstable critical patients (first 24-48 hours) 07/14/2018  4:00 AM  Site Assessment Clean;Intact 06/29/2018  4:00 AM  Catheter Maintenance Bag below level of bladder;Catheter secured;Drainage bag/tubing not touching  floor;Insertion date on drainage bag;No dependent loops;Seal intact;Bag emptied prior to transport 07/08/2018  5:10 PM  Collection Container Standard drainage bag 07/10/2018  5:10 PM  Urinary Catheter Interventions Unclamped 06/18/2018  5:10 PM  Output (mL) 400 mL 06/25/2018  1:24 AM    Anti-infectives:  Anti-infectives (From admission, onward)   Start     Dose/Rate Route Frequency Ordered Stop   07/17/18 0000  vancomycin (VANCOCIN) IVPB 750 mg/150 ml premix  Status:  Discontinued     750 mg 150 mL/hr over 60 Minutes Intravenous Every 48 hours 07/04/2018 1433 06/26/2018 1020   06/30/2018 1030  cefTRIAXone (ROCEPHIN) 2 g in sodium chloride 0.9 % 100 mL IVPB     2 g 200 mL/hr over 30 Minutes Intravenous Daily 06/18/2018 1020     07/04/2018 1000  ceFEPIme (MAXIPIME) 1 g in sodium chloride 0.9 % 100 mL IVPB  Status:  Discontinued     1 g 200 mL/hr over 30 Minutes Intravenous Every 12 hours 06/18/2018 1433 06/20/2018 0346   07/06/2018 0400  ceFEPIme (MAXIPIME) 2 g in sodium chloride 0.9 % 100 mL IVPB  Status:  Discontinued     2 g 200 mL/hr over 30 Minutes Intravenous Every 12 hours 07/13/2018 0346 06/26/2018 1020   07/01/2018 1300  ceFEPIme (MAXIPIME) 2 g in sodium chloride 0.9 % 100 mL IVPB     2 g 200 mL/hr over 30 Minutes Intravenous  Once 06/30/2018 1252 06/20/2018 1403   06/28/2018 1300  metroNIDAZOLE (FLAGYL) IVPB 500 mg  Status:  Discontinued     500 mg 100 mL/hr over 60 Minutes Intravenous Every 8 hours 07/06/2018 1252 06/20/2018 1020   07/09/2018 1300  vancomycin (VANCOCIN)  IVPB 1000 mg/200 mL premix     1,000 mg 200 mL/hr over 60 Minutes Intravenous  Once 06/30/2018 1252 06/20/2018 1437      Microbiology: Results for orders placed or performed during the hospital encounter of 06/21/2018  Blood Culture (routine x 2)     Status: Abnormal   Collection Time: 06/19/2018 12:52 PM  Result Value Ref Range Status   Specimen Description   Final    BLOOD RIGHT HAND Performed at [facility], [address],  [place], [place] [address]    Special Requests   Final    BOTTLES DRAWN AEROBIC AND ANAEROBIC Blood Culture adequate volume Performed at [facility], [address] [place], [place] [address]    Culture  Setup Time   Final    GRAM NEGATIVE RODS IN BOTH AEROBIC AND ANAEROBIC BOTTLES CRITICAL RESULT CALLED TO, READ BACK BY AND VERIFIED WITH: DAVID BESANTI 07/14/2018 @ 0236  [place] Performed at [facility], [address], [place], [place] [address]    Culture PROTEUS MIRABILIS (A)  Final   Report Status 07/18/2018 FINAL  Final   Organism ID, Bacteria PROTEUS MIRABILIS  Final      Susceptibility   Proteus mirabilis - MIC*    AMPICILLIN <=2 SENSITIVE Sensitive     CEFAZOLIN <=4 SENSITIVE Sensitive     CEFEPIME <=1 SENSITIVE Sensitive     CEFTAZIDIME <=1 SENSITIVE Sensitive     CEFTRIAXONE <=1 SENSITIVE Sensitive     CIPROFLOXACIN <=0.25 SENSITIVE Sensitive     GENTAMICIN <=1 SENSITIVE Sensitive     IMIPENEM 2 SENSITIVE Sensitive     TRIMETH/SULFA <=20 SENSITIVE Sensitive     AMPICILLIN/SULBACTAM <=2 SENSITIVE Sensitive     PIP/TAZO <=4 SENSITIVE Sensitive     * PROTEUS MIRABILIS  Blood Culture (routine x 2)     Status: Abnormal   Collection Time: 06/25/2018 12:52 PM  Result Value Ref Range Status   Specimen Description   Final    BLOOD BLOOD LEFT HAND Performed at [facility], [address], [place], [place] [address]    Special Requests   Final    BOTTLES DRAWN AEROBIC AND ANAEROBIC Blood Culture adequate volume Performed at [facility], [address], [place], [place] [address]    Culture  Setup Time   Final    GRAM NEGATIVE RODS IN BOTH AEROBIC AND ANAEROBIC BOTTLES CRITICAL RESULT CALLED TO, READ BACK BY AND VERIFIED WITH: DAVID BESANTI AT 0236 ON 06/28/2018 [facility] Performed at [facility], [address] [place], [place] [address]    Culture (A)  Final    PROTEUS MIRABILIS SUSCEPTIBILITIES PERFORMED ON PREVIOUS CULTURE WITHIN  THE LAST 5 DAYS. Performed at [facility], [place] [address], [place], [place] [address]    Report Status 07/18/2018 FINAL  Final  Blood Culture ID Panel (Reflexed)     Status: Abnormal   Collection Time: 07/13/2018 12:52 PM  Result Value Ref Range Status   Enterococcus species NOT DETECTED NOT DETECTED Final   Listeria monocytogenes NOT DETECTED NOT DETECTED Final   Staphylococcus species NOT DETECTED NOT DETECTED Final   Staphylococcus aureus (BCID) NOT DETECTED NOT DETECTED Final   Streptococcus species NOT DETECTED NOT DETECTED Final   Streptococcus agalactiae NOT DETECTED NOT DETECTED Final   Streptococcus pneumoniae NOT DETECTED NOT DETECTED Final   Streptococcus pyogenes NOT DETECTED NOT DETECTED Final   Acinetobacter baumannii NOT DETECTED NOT DETECTED Final   Enterobacteriaceae species DETECTED (A)  NOT DETECTED Final    Comment: Enterobacteriaceae represent a large family of gram-negative bacteria, not a single organism. CRITICAL RESULT CALLED TO, READ BACK BY AND VERIFIED WITH: DAVID BESANTI 07/12/2018 @ 0236 [place]    Enterobacter cloacae complex NOT DETECTED NOT DETECTED Final   Escherichia coli NOT DETECTED NOT DETECTED Final   Klebsiella oxytoca NOT DETECTED NOT DETECTED Final   Klebsiella pneumoniae NOT DETECTED NOT DETECTED Final   Proteus species DETECTED (A) NOT DETECTED Final    Comment: CRITICAL RESULT CALLED TO, READ BACK BY AND VERIFIED WITH: DAVID BESANTI 07/13/2018 @ 0236  MLK    Serratia marcescens NOT DETECTED NOT DETECTED Final   Carbapenem resistance NOT DETECTED NOT DETECTED Final   Haemophilus influenzae NOT DETECTED NOT DETECTED Final   Neisseria meningitidis NOT DETECTED NOT DETECTED Final   Pseudomonas aeruginosa NOT DETECTED NOT DETECTED Final   Candida albicans NOT DETECTED NOT DETECTED Final   Candida glabrata NOT DETECTED NOT DETECTED Final   Candida krusei NOT DETECTED NOT DETECTED Final   Candida parapsilosis NOT DETECTED NOT DETECTED Final    Candida tropicalis NOT DETECTED NOT DETECTED Final    Comment: Performed at [facility], [address], [place], [place] [address]  Urine culture     Status: None   Collection Time: 06/18/2018  1:16 PM  Result Value Ref Range Status   Specimen Description   Final    URINE, CLEAN CATCH Performed at [facility], [address] [place], [place] [address]    Special Requests   Final    NONE Performed at [facility], [address], [place], [place] [address]    Culture   Final    NO GROWTH Performed at [facility], [place] [address], [place], [place] [address]    Report Status 07/13/2018 FINAL  Final  MRSA PCR Screening     Status: None   Collection Time: 07/14/2018  4:59 PM  Result Value Ref Range Status   MRSA by PCR NEGATIVE NEGATIVE Final    Comment:        The GeneXpert MRSA Assay (FDA approved for NASAL specimens only), is one component of a comprehensive MRSA colonization surveillance program. It is not intended to diagnose MRSA infection nor to guide or monitor treatment for MRSA infections. Performed at [facility], [address] [place], [place] [address]   Aerobic/Anaerobic Culture (surgical/deep wound)     Status: None (Preliminary result)   Collection Time: 06/27/2018  2:17 PM  Result Value Ref Range Status   Specimen Description   Final    KNEE BELOW RIGHT Performed at [facility], [address] [place], [place] [address]    Special Requests   Final    NONE Performed at [facility], [address] [place], [place] [address]    Gram Stain   Final    RARE WBC PRESENT, PREDOMINANTLY PMN RARE GRAM NEGATIVE RODS Performed at [facility], [place] [address] [place],  [address]    Culture ABUNDANT PROTEUS MIRABILIS  Final   Report Status PENDING  Incomplete   Studies: Dg Wrist 2 Views Right  Result Date: 07/05/2018 CLINICAL DATA:  Fall, landed on right wrist.  Pain EXAM: RIGHT  WRIST - 2 VIEW COMPARISON:  None. 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 Diffuse vascular calcifications. No fracture. Slight widening of the scapholunate distance. IMPRESSION: Degenerative changes in the right wrist.  No fracture.  Slight widening of the scapholunate distance could reflect scapholunate dissociation. Electronically Signed   By: Rolm Baptise M.D.   On: 07/05/2018 10:35   Dg Knee 2 Views Right  Result Date: 07/04/2018 CLINICAL DATA:  Right below-knee amputation stump infection. EXAM: RIGHT KNEE - 1-2 VIEW COMPARISON:  None. FINDINGS: Status post right below-knee amputation. Vascular calcifications are noted no lytic defect is seen. However, small amount of gas is noted in the distal soft tissues of the stump suggesting infection. IMPRESSION: Small amount of soft tissue gas seen in distal portion of BKA stump suggesting infection. Electronically Signed   By: Marijo Conception, M.D.   On: 07/13/2018 13:31   Dg Abd 1 View  Result Date: 07/03/2018 CLINICAL DATA:  OG placement EXAM: ABDOMEN - 1 VIEW COMPARISON:  CT 01/19/2017 FINDINGS: Esophageal tube tip overlies the gastric body. Visible upper gas pattern is unobstructed. Surgical hardware in the lower spine. IMPRESSION: Esophageal tube tip overlies the mid abdomen, presumably over the gastric body. Electronically Signed   By: Donavan Foil M.D.   On: 07/05/2018 18:25   Ct Head Wo Contrast  Result Date: 07/05/2018 CLINICAL DATA:  Fall EXAM: CT HEAD WITHOUT CONTRAST TECHNIQUE: Contiguous axial images were obtained from the base of the skull through the vertex without intravenous contrast. COMPARISON:  06/08/2017 FINDINGS: Brain: Mild global atrophy appropriate to age. Minimal chronic ischemic changes in the periventricular white matter. There is no mass effect, midline shift, or acute intracranial hemorrhage. Vascular: No hyperdense vessel or unexpected calcification. Skull: The cranium  is intact. Sinuses/Orbits: Visualized mastoid air cells and paranasal sinuses are clear. Orbits are grossly within normal limits. Other: Soft tissue hematoma over the right temporal bone is noted. IMPRESSION: No acute intracranial pathology. Electronically Signed   By: Marybelle Killings M.D.   On: 07/05/2018 09:03   Dg Chest Port 1 View  Result Date: 07/17/2018 CLINICAL DATA:  Acute respiratory failure. EXAM: PORTABLE CHEST 1 VIEW COMPARISON:  07/08/2018 FINDINGS: The patient is rotated to the right. Endotracheal tube terminates 4.5 cm above the carina. Right jugular catheter terminates over the mid to lower SVC. Enteric tube courses into the left upper abdomen with tip not imaged. The cardiomediastinal silhouette is unchanged with normal heart size. Basilar predominant interstitial densities in both lungs on the prior study have improved. There are likely small persistent right and possibly left pleural effusions. No pneumothorax is identified. IMPRESSION: Improved bilateral lung aeration. Electronically Signed   By: Logan Bores M.D.   On: 07/17/2018 07:00   Dg Chest Port 1 View  Result Date: 06/25/2018 CLINICAL DATA:  Intubation. EXAM: PORTABLE CHEST 1 VIEW COMPARISON:  06/23/2018. FINDINGS: Interim placement of NG tube, its tip is below left hemidiaphragm. Endotracheal tube, right IJ line stable position. Heart size normal. Bibasilar. Bibasilar infiltrates/edema. Small bilateral pleural effusions. IMPRESSION: 1. Interim placement of NG tube, its tip is below left hemidiaphragm. Endotracheal tube and right IJ line stable position. 2. Bibasilar atelectasis and bibasilar infiltrates/edema. Small bilateral pleural effusions. Electronically Signed   By: Marcello Moores  Register   On: 06/29/2018 09:06   Dg Chest Portable 1 View  Result Date: 07/12/2018 CLINICAL DATA:  ET tube insertion EXAM: PORTABLE CHEST 1 VIEW COMPARISON:  07/13/2018 FINDINGS: Endotracheal tube with the tip 3.3 cm above the carina. Right jugular  central venous catheter with the tip projecting over the SVC. Chronic elevation of the right diaphragm. Mild hazy left basilar airspace disease likely reflecting atelectasis. No pleural effusion or pneumothorax. Stable cardiomediastinal silhouette.  No aggressive osseous lesion. IMPRESSION: Endotracheal tube with the tip 3.3 cm above the carina. Right jugular central venous catheter with the tip projecting over the SVC. Electronically Signed   By: Kathreen Devoid   On: 06/21/2018 16:10   Dg Chest Portable 1 View  Result Date: 07/02/2018 CLINICAL DATA:  Central line placement EXAM: PORTABLE CHEST 1 VIEW COMPARISON:  06/20/2018 FINDINGS: Right central line is in place with the tip at the cavoatrial junction. No pneumothorax. Bibasilar atelectasis. Mild vascular congestion. Suspect small effusions. No acute bony abnormality. IMPRESSION: Right central line tip at the cavoatrial junction.  No pneumothorax. Vascular congestion, bibasilar atelectasis and possible small effusions. Electronically Signed   By: Rolm Baptise M.D.   On: 07/09/2018 15:07   Dg Chest Port 1 View  Result Date: 07/06/2018 CLINICAL DATA:  Recent right leg amputation.  Possible sepsis. EXAM: PORTABLE CHEST 1 VIEW COMPARISON:  07/04/2018 FINDINGS: Heart is normal size. Bibasilar atelectasis or infiltrates noted. Mild elevation of the right hemidiaphragm. No visible effusions or acute bony abnormality. IMPRESSION: Bibasilar atelectasis or infiltrates. Mild elevation of the right hemidiaphragm. Electronically Signed   By: Rolm Baptise M.D.   On: 07/04/2018 13:24   Dg Chest Port 1 View  Result Date: 07/04/2018 CLINICAL DATA:  Chest pain EXAM: PORTABLE CHEST 1 VIEW COMPARISON:  12/14/2017 FINDINGS: Heart is normal size. Chronic increased markings throughout the lungs, likely scarring. No effusions or confluent opacities. No acute bony abnormality. IMPRESSION: Chronic changes.  No active disease. Electronically Signed   By: Rolm Baptise M.D.   On:  07/04/2018 11:07    Consults: Treatment Team:  Pccm, Ander Gaster, MD Algernon Huxley, MD   Subjective:    Overnight Issues: No significant issues noted yesterday and overnight.  Patient is sedated on mechanical ventilation  Objective:  Vital signs for last 24 hours: Temp:  [97.6 F (36.4 C)-98.9 F (37.2 C)] 98.9 F (37.2 C) (01/02 0400) Pulse Rate:  [74-127] 111 (01/02 0600) Resp:  [0-28] 22 (01/02 0600) BP: (103-131)/(63-101) 127/77 (01/02 0600) SpO2:  [90 %-97 %] 96 % (01/02 0600) FiO2 (%):  [21 %] 21 % (01/02 0321)  Hemodynamic parameters for last 24 hours: CVP:  [7 mmHg-10 mmHg] 10 mmHg  Intake/Output from previous day: 01/01 0701 - 01/02 0700 In: 1252.3 [I.V.:1152.3; IV Piggyback:100] Out: 650 [Urine:650]  Intake/Output this shift: No intake/output data recorded.  Vent settings for last 24 hours: Vent Mode: PRVC FiO2 (%):  [21 %] 21 % Set Rate:  [16 bmp] 16 bmp Vt Set:  [400 mL] 400 mL PEEP:  [5 cmH20] 5 cmH20 Plateau Pressure:  [8 cmH20] 8 cmH20  Physical Exam:  Vital signs:       Please see the above listed vital signs Patient is unresponsive, on mechanical ventilation, norepinephrine, tachycardic with a pulse of 140 and marginal blood pressures HEENT:           Patient is orally intubated, trachea midline, no thyromegaly appreciated, right internal jugular central line has been placed Cardiovascular:           Tachycardia, appears to be sinus tachycardia with a rate of 1 30-1 40 Pulmonary:      Rhonchi appreciated diffusely Abdominal:      Positive bowel sounds, soft exam Extremities:     Fluctuance noted in the right BKA with some demarcation and skin with foul-smelling drainage Neurologic:      Patient is on sedation status post intubation limited exam at this time  Assessment/Plan:  Septic shock.    Source most likely from BKA, status post debridement.  Patient's hemodynamics are improved.  Presently on low-dose of Neo-Synephrine and hydrocortisone.  Is  on Rocephin for Proteus sepsis  Respiratory failure.  Will start spontaneous awakening and breathing trials today and assess for possible extubation.  Pending morning labs  Hermelinda Dellen, DO  Critical care time 30 minutes Sharisse Rantz 07/18/2018  *Care during the described time interval was provided by me and/or other providers on the critical care team.  I have reviewed this patient's available data, including medical history, events of note, physical examination and test results as part of my evaluation. Patient ID: Melissa [name], female   DOB: 1941-03-27, 78 y.o.   MRN: [id number] Patient ID: Melissa [name], female   DOB: [birth year], 78 y.o.   MRN: [id number]

## 2018-07-18 NOTE — Anesthesia Postprocedure Evaluation (Signed)
Anesthesia Post Note  Patient: Melissa [name]  Procedure(s) Performed: AMPUTATION BELOW KNEE-REVISION (Right )  Patient location during evaluation: ICU Anesthesia Type: General Level of consciousness: patient remains intubated per anesthesia plan and sedated Pain management: pain level controlled Vital Signs Assessment: post-procedure vital signs reviewed and stable Respiratory status: patient on ventilator - see flowsheet for VS Cardiovascular status: stable Anesthetic complications: no Comments: Pt came to surgery from ICU intubated. Remained intubated after procedure and remains intubated in ICU. No further need for anesthesia involvement     Last Vitals:  Vitals:   07/18/18 0500 07/18/18 0600  BP: 123/83 127/77  Pulse: (!) 127 (!) 111  Resp: (!) 26 (!) 22  Temp:    SpO2: 96% 96%    Last Pain:  Vitals:   07/18/18 0400  TempSrc: Oral  PainSc:                  Melissa [name]

## 2018-07-19 LAB — CBC
HCT: 24.5 % — ABNORMAL LOW (ref 36.0–46.0)
Hemoglobin: 7.3 g/dL — ABNORMAL LOW (ref 12.0–15.0)
MCH: 29.8 pg (ref 26.0–34.0)
MCHC: 29.8 g/dL — ABNORMAL LOW (ref 30.0–36.0)
MCV: 100 fL (ref 80.0–100.0)
Platelets: 285 10*3/uL (ref 150–400)
RBC: 2.45 MIL/uL — ABNORMAL LOW (ref 3.87–5.11)
RDW: 15.9 % — ABNORMAL HIGH (ref 11.5–15.5)
WBC: 28.7 10*3/uL — ABNORMAL HIGH (ref 4.0–10.5)
nRBC: 0.4 % — ABNORMAL HIGH (ref 0.0–0.2)

## 2018-07-19 LAB — GLUCOSE, CAPILLARY
GLUCOSE-CAPILLARY: 129 mg/dL — AB (ref 70–99)
GLUCOSE-CAPILLARY: 192 mg/dL — AB (ref 70–99)
Glucose-Capillary: 118 mg/dL — ABNORMAL HIGH (ref 70–99)
Glucose-Capillary: 120 mg/dL — ABNORMAL HIGH (ref 70–99)

## 2018-07-19 LAB — BASIC METABOLIC PANEL
ANION GAP: 3 — AB (ref 5–15)
BUN: 46 mg/dL — ABNORMAL HIGH (ref 8–23)
CO2: 18 mmol/L — ABNORMAL LOW (ref 22–32)
Calcium: 7.3 mg/dL — ABNORMAL LOW (ref 8.9–10.3)
Chloride: 128 mmol/L — ABNORMAL HIGH (ref 98–111)
Creatinine, Ser: 0.6 mg/dL (ref 0.44–1.00)
GFR calc Af Amer: >60 mL/min (ref >60)
GFR calc non Af Amer: >60 mL/min (ref >60)
GLUCOSE: 147 mg/dL — AB (ref 70–99)
Potassium: 4 mmol/L (ref 3.5–5.1)
Sodium: 149 mmol/L — ABNORMAL HIGH (ref 135–145)

## 2018-07-19 LAB — SURGICAL PATHOLOGY

## 2018-07-19 LAB — PHOSPHORUS: Phosphorus: 1.6 mg/dL — ABNORMAL LOW (ref 2.5–4.6)

## 2018-07-19 LAB — MAGNESIUM: Magnesium: 1.9 mg/dL (ref 1.7–2.4)

## 2018-07-19 MED ORDER — FREE WATER
30.0000 mL | Status: DC
  Administered 2018-07-19: 30 mL

## 2018-07-19 MED ORDER — ADULT MULTIVITAMIN LIQUID CH: 15.0000 mL | Freq: Every day | ORAL | Status: DC

## 2018-07-19 MED ORDER — VITAMIN C 500 MG PO TABS: 250.0000 mg | ORAL_TABLET | Freq: Two times a day (BID) | ORAL | Status: DC

## 2018-07-19 MED ORDER — HYDROMORPHONE HCL 1 MG/ML IJ SOLN
1.0000 mg | Freq: Every day | INTRAMUSCULAR | Status: DC | PRN
  Administered 2018-07-19: 1 mg via INTRAVENOUS
  Filled 2018-07-19: qty 1

## 2018-07-19 MED ORDER — SCOPOLAMINE 1 MG/3DAYS TD PT72
1.0000 | MEDICATED_PATCH | TRANSDERMAL | Status: DC
  Administered 2018-07-22: 17:00:00 1.5 mg via TRANSDERMAL
  Filled 2018-07-19 (×2): qty 1

## 2018-07-19 MED ORDER — DEXTROSE 5 % IV SOLN
INTRAVENOUS | Status: DC
  Administered 2018-07-19: 12:00:00 via INTRAVENOUS

## 2018-07-19 MED ORDER — MORPHINE 100MG IN NS 100ML (1MG/ML) PREMIX INFUSION
6.0000 mg/h | INTRAVENOUS | Status: DC
  Administered 2018-07-19: 5 mg/h via INTRAVENOUS
  Filled 2018-07-19 (×3): qty 100

## 2018-07-19 MED ORDER — LORAZEPAM 2 MG/ML IJ SOLN: 1.0000 mg | INTRAMUSCULAR | Status: DC | PRN

## 2018-07-19 MED ORDER — SODIUM CHLORIDE 0.9 % IV SOLN
2.0000 mg/h | INTRAVENOUS | Status: DC
  Filled 2018-07-19: qty 10

## 2018-07-19 MED ORDER — CEFAZOLIN SODIUM-DEXTROSE 2-4 GM/100ML-% IV SOLN
2.0000 g | Freq: Three times a day (TID) | INTRAVENOUS | Status: DC
  Filled 2018-07-19 (×2): qty 100

## 2018-07-19 MED ORDER — LORAZEPAM 2 MG/ML IJ SOLN
2.0000 mg | INTRAMUSCULAR | Status: DC | PRN
  Filled 2018-07-19: qty 1

## 2018-07-19 MED ORDER — VITAL HIGH PROTEIN PO LIQD
1000.0000 mL | ORAL | Status: DC
  Administered 2018-07-19: 1000 mL

## 2018-07-19 MED ORDER — GLYCOPYRROLATE 0.2 MG/ML IJ SOLN: 0.1000 mg | Freq: Once | INTRAMUSCULAR | Status: DC

## 2018-07-19 MED ORDER — POTASSIUM & SODIUM PHOSPHATES 280-160-250 MG PO PACK
1.0000 | PACK | Freq: Three times a day (TID) | ORAL | Status: DC
  Administered 2018-07-19 (×2): 1
  Filled 2018-07-19 (×3): qty 1

## 2018-07-19 NOTE — Progress Notes (Signed)
Nutrition Brief Note   Pt with low phosphorus today; will plan to initiate Vital High Protein at trickle rate of 46m/hr as pt at high risk for refeeding.   Once phosphorus is above 2, will begin to increase vital high protein by 128mhr q8 hours until goal rate of 4054mr is reached.  Regimen with propofol rate of 8.8ml90m provides 1202 kcal, 84 grams of protein, 806 mL H2O daily.   Free water flush of 30 mL Q4hrs to maintain tube patency  Liquid MVI daily per tube to meet 100% RDIs for vitamins/minerals.  Vitamin C 250 mg BID per tube.  Monitor magnesium, potassium, and phosphorus daily for at least 3 days, MD to replete as needed, as pt is at risk for refeeding syndrome given severe malnutrition.  Estimated Nutritional Needs:   Kcal:  1322 (PSU 2003b w/ MSJ 962, Ve 8, Tmax 38.1)  Protein:  75-85 grams (1.5-1.8 grams/kg)  Fluid:  1.2 L/day (25 mL/kg)  CaseKoleen Distance RD, LDN Pager #- [phone]#- [phone] Hours Pager: [phone]

## 2018-07-19 NOTE — Progress Notes (Signed)
Palliative:  I have received palliative consult and went to visit with patient and family but no family at bedside. Melissa [name] is resting comfortably on vent. I did call and leave voicemail for daughter, Richardean Chimera [phone], requesting to meet with family Monday 07/22/18 to see how I can help support them. Will await return call and will reach back out to them Monday morning upon my return.   No charge  Vinie Sill, NP Palliative Medicine Team Pager # [phone] (M-F 8a-5p) Team Phone # [phone] (Nights/Weekends)

## 2018-07-19 NOTE — Progress Notes (Signed)
Patient extubated to room air per MD orders, for palliative extubation and comfort care.

## 2018-07-19 NOTE — Progress Notes (Signed)
[facility] decided that they wanted terminal extubation of patient. Dr Jefferson Fuel notified and orders received. [id number] Patient extubated per RT as ordered. Morphine drip started before extubation process. Late note 1700 Right stump dressing changed per orders .

## 2018-07-19 NOTE — Progress Notes (Addendum)
Follow up - Critical Care Medicine Note  Patient Details:    Melissa [name] is an 78 y.o. 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  Found to be unresponsive, hypotensive requiring central line placement, fluid resuscitation, intubation, pressors and broad-spectrum antibiotic coverage.  Has been seen by vascular surgery who plans on performing a debridement.    Lines, Airways, Drains: Airway 7 mm (Active)  Secured at (cm) 21 cm 06/28/2018  3:07 AM  Measured From Lips 06/23/2018  3:07 AM  Secured Location Center 07/06/2018  3:07 AM  Secured By [employer] 06/22/2018  3:07 AM  Tube Holder Repositioned Yes 07/04/2018  3:07 AM  Cuff Pressure (cm H2O) 26 cm H2O 07/13/2018  7:37 PM  Site Condition Dry 07/14/2018  7:37 PM     CVC Triple Lumen 06/27/2018 Right Internal jugular (Active)  Indication for Insertion or Continuance of Line Vasoactive infusions 06/25/2018  9:00 PM  Site Assessment Clean;Dry;Intact 07/03/2018  9:00 PM  Proximal Lumen Status Infusing 06/25/2018  9:00 PM  Medial Lumen Status Infusing 07/06/2018  9:00 PM  Distal Lumen Status Infusing 06/22/2018  9:00 PM  Dressing Type Transparent 06/22/2018  9:00 PM  Dressing Status Clean;Dry;Intact 06/17/2018  9:00 PM  Line Care Zeroed and calibrated 06/19/2018  9:00 PM     Urethral Catheter Melissa [name] Double-lumen;Non-latex 14 Fr. (Active)  Indication for Insertion or Continuance of Catheter Unstable critical patients (first 24-48 hours) 07/09/2018  4:00 AM  Site Assessment Clean;Intact 07/14/2018  4:00 AM  Catheter Maintenance Bag below level of bladder;Catheter secured;Drainage bag/tubing not touching  floor;Insertion date on drainage bag;No dependent loops;Seal intact;Bag emptied prior to transport 07/01/2018  5:10 PM  Collection Container Standard drainage bag 06/23/2018  5:10 PM  Urinary Catheter Interventions Unclamped 06/18/2018  5:10 PM  Output (mL) 400 mL 07/07/2018  1:24 AM    Anti-infectives:  Anti-infectives (From admission, onward)   Start     Dose/Rate Route Frequency Ordered Stop   07/17/18 0000  vancomycin (VANCOCIN) IVPB 750 mg/150 ml premix  Status:  Discontinued     750 mg 150 mL/hr over 60 Minutes Intravenous Every 48 hours 06/30/2018 1433 07/04/2018 1020   07/03/2018 1030  cefTRIAXone (ROCEPHIN) 2 g in sodium chloride 0.9 % 100 mL IVPB     2 g 200 mL/hr over 30 Minutes Intravenous Daily 07/07/2018 1020     06/29/2018 1000  ceFEPIme (MAXIPIME) 1 g in sodium chloride 0.9 % 100 mL IVPB  Status:  Discontinued     1 g 200 mL/hr over 30 Minutes Intravenous Every 12 hours 06/17/2018 1433 06/21/2018 0346   07/10/2018 0400  ceFEPIme (MAXIPIME) 2 g in sodium chloride 0.9 % 100 mL IVPB  Status:  Discontinued     2 g 200 mL/hr over 30 Minutes Intravenous Every 12 hours 06/17/2018 0346 06/27/2018 1020   06/28/2018 1300  ceFEPIme (MAXIPIME) 2 g in sodium chloride 0.9 % 100 mL IVPB     2 g 200 mL/hr over 30 Minutes Intravenous  Once 06/19/2018 1252 07/04/2018 1403   06/20/2018 1300  metroNIDAZOLE (FLAGYL) IVPB 500 mg  Status:  Discontinued     500 mg 100 mL/hr over 60 Minutes Intravenous Every 8 hours 07/08/2018 1252 07/14/2018 1020   06/19/2018 1300  vancomycin (VANCOCIN)  IVPB 1000 mg/200 mL premix     1,000 mg 200 mL/hr over 60 Minutes Intravenous  Once 07/11/2018 1252 07/01/2018 1437      Microbiology: Results for orders placed or performed during the hospital encounter of 07/14/2018  Blood Culture (routine x 2)     Status: Abnormal   Collection Time: 06/22/2018 12:52 PM  Result Value Ref Range Status   Specimen Description   Final    BLOOD RIGHT HAND Performed at [facility], [address]  [place], [place] [address]    Special Requests   Final    BOTTLES DRAWN AEROBIC AND ANAEROBIC Blood Culture adequate volume Performed at [facility], [address], [place], [place] [address]    Culture  Setup Time   Final    GRAM NEGATIVE RODS IN BOTH AEROBIC AND ANAEROBIC BOTTLES CRITICAL RESULT CALLED TO, READ BACK BY AND VERIFIED WITH: DAVID BESANTI 06/19/2018 @ 0236  [place] Performed at [facility], [address], [place], [place] [address]    Culture PROTEUS MIRABILIS (A)  Final   Report Status 07/18/2018 FINAL  Final   Organism ID, Bacteria PROTEUS MIRABILIS  Final      Susceptibility   Proteus mirabilis - MIC*    AMPICILLIN <=2 SENSITIVE Sensitive     CEFAZOLIN <=4 SENSITIVE Sensitive     CEFEPIME <=1 SENSITIVE Sensitive     CEFTAZIDIME <=1 SENSITIVE Sensitive     CEFTRIAXONE <=1 SENSITIVE Sensitive     CIPROFLOXACIN <=0.25 SENSITIVE Sensitive     GENTAMICIN <=1 SENSITIVE Sensitive     IMIPENEM 2 SENSITIVE Sensitive     TRIMETH/SULFA <=20 SENSITIVE Sensitive     AMPICILLIN/SULBACTAM <=2 SENSITIVE Sensitive     PIP/TAZO <=4 SENSITIVE Sensitive     * PROTEUS MIRABILIS  Blood Culture (routine x 2)     Status: Abnormal   Collection Time: 06/28/2018 12:52 PM  Result Value Ref Range Status   Specimen Description   Final    BLOOD BLOOD LEFT HAND Performed at [facility], [address], [place], [place] [address]    Special Requests   Final    BOTTLES DRAWN AEROBIC AND ANAEROBIC Blood Culture adequate volume Performed at [facility], [address] [place], [place] [address]    Culture  Setup Time   Final    GRAM NEGATIVE RODS IN BOTH AEROBIC AND ANAEROBIC BOTTLES CRITICAL RESULT CALLED TO, READ BACK BY AND VERIFIED WITH: DAVID BESANTI AT 0236 ON 06/17/2018 [facility] Performed at [facility], [address] [place], [place] [address]    Culture (A)  Final    PROTEUS MIRABILIS SUSCEPTIBILITIES PERFORMED ON PREVIOUS CULTURE WITHIN  THE LAST 5 DAYS. Performed at [facility], [place] [address], [place], [place] [address]    Report Status 07/18/2018 FINAL  Final  Blood Culture ID Panel (Reflexed)     Status: Abnormal   Collection Time: 06/18/2018 12:52 PM  Result Value Ref Range Status   Enterococcus species NOT DETECTED NOT DETECTED Final   Listeria monocytogenes NOT DETECTED NOT DETECTED Final   Staphylococcus species NOT DETECTED NOT DETECTED Final   Staphylococcus aureus (BCID) NOT DETECTED NOT DETECTED Final   Streptococcus species NOT DETECTED NOT DETECTED Final   Streptococcus agalactiae NOT DETECTED NOT DETECTED Final   Streptococcus pneumoniae NOT DETECTED NOT DETECTED Final   Streptococcus pyogenes NOT DETECTED NOT DETECTED Final   Acinetobacter baumannii NOT DETECTED NOT DETECTED Final   Enterobacteriaceae species DETECTED (A)  NOT DETECTED Final    Comment: Enterobacteriaceae represent a large family of gram-negative bacteria, not a single organism. CRITICAL RESULT CALLED TO, READ BACK BY AND VERIFIED WITH: DAVID BESANTI 06/21/2018 @ 0236 [place]    Enterobacter cloacae complex NOT DETECTED NOT DETECTED Final   Escherichia coli NOT DETECTED NOT DETECTED Final   Klebsiella oxytoca NOT DETECTED NOT DETECTED Final   Klebsiella pneumoniae NOT DETECTED NOT DETECTED Final   Proteus species DETECTED (A) NOT DETECTED Final    Comment: CRITICAL RESULT CALLED TO, READ BACK BY AND VERIFIED WITH: DAVID BESANTI 07/12/2018 @ 0236  MLK    Serratia marcescens NOT DETECTED NOT DETECTED Final   Carbapenem resistance NOT DETECTED NOT DETECTED Final   Haemophilus influenzae NOT DETECTED NOT DETECTED Final   Neisseria meningitidis NOT DETECTED NOT DETECTED Final   Pseudomonas aeruginosa NOT DETECTED NOT DETECTED Final   Candida albicans NOT DETECTED NOT DETECTED Final   Candida glabrata NOT DETECTED NOT DETECTED Final   Candida krusei NOT DETECTED NOT DETECTED Final   Candida parapsilosis NOT DETECTED NOT DETECTED Final    Candida tropicalis NOT DETECTED NOT DETECTED Final    Comment: Performed at [facility], [address], [place], [place] [address]  Urine culture     Status: None   Collection Time: 06/24/2018  1:16 PM  Result Value Ref Range Status   Specimen Description   Final    URINE, CLEAN CATCH Performed at [facility], [address], [place], [place] [address]    Special Requests   Final    NONE Performed at [facility], [address], [place], [place] [address]    Culture   Final    NO GROWTH Performed at [facility], [place] [address], [place], [place] [address]    Report Status 06/20/2018 FINAL  Final  MRSA PCR Screening     Status: None   Collection Time: 07/08/2018  4:59 PM  Result Value Ref Range Status   MRSA by PCR NEGATIVE NEGATIVE Final    Comment:        The GeneXpert MRSA Assay (FDA approved for NASAL specimens only), is one component of a comprehensive MRSA colonization surveillance program. It is not intended to diagnose MRSA infection nor to guide or monitor treatment for MRSA infections. Performed at [facility], [address] [place], [place] [address]   Aerobic/Anaerobic Culture (surgical/deep wound)     Status: None (Preliminary result)   Collection Time: 07/14/2018  2:17 PM  Result Value Ref Range Status   Specimen Description   Final    KNEE BELOW RIGHT Performed at [facility], [address] [place], [place] [address]    Special Requests   Final    NONE Performed at [facility], [address] [place], [place] [address]    Gram Stain   Final    RARE WBC PRESENT, PREDOMINANTLY PMN RARE GRAM NEGATIVE RODS Performed at [facility], [place] [address] [place],  [address]    Culture   Final    ABUNDANT PROTEUS MIRABILIS NO ANAEROBES ISOLATED; CULTURE IN PROGRESS FOR 5 DAYS    Report Status PENDING  Incomplete   Organism ID, Bacteria PROTEUS MIRABILIS  Final       Susceptibility   Proteus mirabilis - MIC*    AMPICILLIN <=2 SENSITIVE Sensitive     CEFAZOLIN <=4 SENSITIVE Sensitive     CEFEPIME <=1 SENSITIVE Sensitive     CEFTAZIDIME <=1  SENSITIVE Sensitive     CEFTRIAXONE <=1 SENSITIVE Sensitive     CIPROFLOXACIN <=0.25 SENSITIVE Sensitive     GENTAMICIN <=1 SENSITIVE Sensitive     IMIPENEM 4 SENSITIVE Sensitive     TRIMETH/SULFA <=20 SENSITIVE Sensitive     AMPICILLIN/SULBACTAM <=2 SENSITIVE Sensitive     PIP/TAZO <=4 SENSITIVE Sensitive     * ABUNDANT PROTEUS MIRABILIS   Studies: Dg Wrist 2 Views Right  Result Date: 07/05/2018 CLINICAL DATA:  Fall, landed on right wrist.  Pain EXAM: RIGHT WRIST - 2 VIEW COMPARISON:  None. 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 Diffuse vascular calcifications. No fracture. Slight widening of the scapholunate distance. IMPRESSION: Degenerative changes in the right wrist.  No fracture. Slight widening of the scapholunate distance could reflect scapholunate dissociation. Electronically Signed   By: Rolm Baptise M.D.   On: 07/05/2018 10:35   Dg Knee 2 Views Right  Result Date: 06/18/2018 CLINICAL DATA:  Right below-knee amputation stump infection. EXAM: RIGHT KNEE - 1-2 VIEW COMPARISON:  None. FINDINGS: Status post right below-knee amputation. Vascular calcifications are noted no lytic defect is seen. However, small amount of gas is noted in the distal soft tissues of the stump suggesting infection. IMPRESSION: Small amount of soft tissue gas seen in distal portion of BKA stump suggesting infection. Electronically Signed   By: Marijo Conception, M.D.   On: 07/04/2018 13:31   Dg Abd 1 View  Result Date: 07/13/2018 CLINICAL DATA:  OG placement EXAM: ABDOMEN - 1 VIEW COMPARISON:  CT 01/19/2017 FINDINGS: Esophageal tube tip overlies the gastric body. Visible upper gas pattern is unobstructed. Surgical hardware in the lower spine. IMPRESSION: Esophageal tube tip  overlies the mid abdomen, presumably over the gastric body. Electronically Signed   By: Donavan Foil M.D.   On: 06/17/2018 18:25   Ct Head Wo Contrast  Result Date: 07/05/2018 CLINICAL DATA:  Fall EXAM: CT HEAD WITHOUT CONTRAST TECHNIQUE: Contiguous axial images were obtained from the base of the skull through the vertex without intravenous contrast. COMPARISON:  06/08/2017 FINDINGS: Brain: Mild global atrophy appropriate to age. Minimal chronic ischemic changes in the periventricular white matter. There is no mass effect, midline shift, or acute intracranial hemorrhage. Vascular: No hyperdense vessel or unexpected calcification. Skull: The cranium is intact. Sinuses/Orbits: Visualized mastoid air cells and paranasal sinuses are clear. Orbits are grossly within normal limits. Other: Soft tissue hematoma over the right temporal bone is noted. IMPRESSION: No acute intracranial pathology. Electronically Signed   By: Marybelle Killings M.D.   On: 07/05/2018 09:03   Dg Chest Port 1 View  Result Date: 07/18/2018 CLINICAL DATA:  Patient on ventilator EXAM: PORTABLE CHEST 1 VIEW COMPARISON:  Chest radiograph 07/17/2018 FINDINGS: Right IJ central venous catheter tip projects over the superior vena cava. ET tube terminates in the mid trachea. Enteric tube courses inferior to the diaphragm. Monitoring leads overlie the patient. Stable cardiac and mediastinal contours. Interval improved aeration of the left lung base with minimal residual heterogeneous opacities left and right lung base. Probable small bilateral pleural effusions. IMPRESSION: Slight interval improvement in aeration of the lung bases with mild residual basilar opacities and small effusions. ET tube mid trachea. Right IJ central venous catheter tip projects over the superior vena cava. Electronically Signed   By: Lovey Newcomer M.D.   On: 07/18/2018 10:24   Dg Chest Port 1 View  Result Date: 07/17/2018 CLINICAL DATA:  Acute respiratory failure. EXAM: PORTABLE  CHEST 1  VIEW COMPARISON:  06/20/2018 FINDINGS: The patient is rotated to the right. Endotracheal tube terminates 4.5 cm above the carina. Right jugular catheter terminates over the mid to lower SVC. Enteric tube courses into the left upper abdomen with tip not imaged. The cardiomediastinal silhouette is unchanged with normal heart size. Basilar predominant interstitial densities in both lungs on the prior study have improved. There are likely small persistent right and possibly left pleural effusions. No pneumothorax is identified. IMPRESSION: Improved bilateral lung aeration. Electronically Signed   By: Logan Bores M.D.   On: 07/17/2018 07:00   Dg Chest Port 1 View  Result Date: 07/12/2018 CLINICAL DATA:  Intubation. EXAM: PORTABLE CHEST 1 VIEW COMPARISON:  06/29/2018. FINDINGS: Interim placement of NG tube, its tip is below left hemidiaphragm. Endotracheal tube, right IJ line stable position. Heart size normal. Bibasilar. Bibasilar infiltrates/edema. Small bilateral pleural effusions. IMPRESSION: 1. Interim placement of NG tube, its tip is below left hemidiaphragm. Endotracheal tube and right IJ line stable position. 2. Bibasilar atelectasis and bibasilar infiltrates/edema. Small bilateral pleural effusions. Electronically Signed   By: Marcello Moores  Register   On: 07/04/2018 09:06   Dg Chest Portable 1 View  Result Date: 07/13/2018 CLINICAL DATA:  ET tube insertion EXAM: PORTABLE CHEST 1 VIEW COMPARISON:  07/07/2018 FINDINGS: Endotracheal tube with the tip 3.3 cm above the carina. Right jugular central venous catheter with the tip projecting over the SVC. Chronic elevation of the right diaphragm. Mild hazy left basilar airspace disease likely reflecting atelectasis. No pleural effusion or pneumothorax. Stable cardiomediastinal silhouette. No aggressive osseous lesion. IMPRESSION: Endotracheal tube with the tip 3.3 cm above the carina. Right jugular central venous catheter with the tip projecting over the SVC.  Electronically Signed   By: Kathreen Devoid   On: 06/18/2018 16:10   Dg Chest Portable 1 View  Result Date: 07/01/2018 CLINICAL DATA:  Central line placement EXAM: PORTABLE CHEST 1 VIEW COMPARISON:  07/01/2018 FINDINGS: Right central line is in place with the tip at the cavoatrial junction. No pneumothorax. Bibasilar atelectasis. Mild vascular congestion. Suspect small effusions. No acute bony abnormality. IMPRESSION: Right central line tip at the cavoatrial junction.  No pneumothorax. Vascular congestion, bibasilar atelectasis and possible small effusions. Electronically Signed   By: Rolm Baptise M.D.   On: 07/04/2018 15:07   Dg Chest Port 1 View  Result Date: 07/04/2018 CLINICAL DATA:  Recent right leg amputation.  Possible sepsis. EXAM: PORTABLE CHEST 1 VIEW COMPARISON:  07/04/2018 FINDINGS: Heart is normal size. Bibasilar atelectasis or infiltrates noted. Mild elevation of the right hemidiaphragm. No visible effusions or acute bony abnormality. IMPRESSION: Bibasilar atelectasis or infiltrates. Mild elevation of the right hemidiaphragm. Electronically Signed   By: Rolm Baptise M.D.   On: 07/02/2018 13:24   Dg Chest Port 1 View  Result Date: 07/04/2018 CLINICAL DATA:  Chest pain EXAM: PORTABLE CHEST 1 VIEW COMPARISON:  12/14/2017 FINDINGS: Heart is normal size. Chronic increased markings throughout the lungs, likely scarring. No effusions or confluent opacities. No acute bony abnormality. IMPRESSION: Chronic changes.  No active disease. Electronically Signed   By: Rolm Baptise M.D.   On: 07/04/2018 11:07    Consults: Treatment Team:  Pccm, Ander Gaster, MD Algernon Huxley, MD   Subjective:    Overnight Issues: Breathing trial attempted yesterday.  Failed secondary to increased work of breathing.    Objective:  Vital signs for last 24 hours: Temp:  [97.7 F (36.5 C)-98.7 F (37.1 C)] 98.2 F (36.8 C) (01/03 0800) Pulse Rate:  [  66-135] 70 (01/03 0800) Resp:  [8-31] 13 (01/03 0800) BP:  (90-132)/(57-92) 105/59 (01/03 0800) SpO2:  [91 %-99 %] 98 % (01/03 0800) FiO2 (%):  [21 %] 21 % (01/03 0349)  Hemodynamic parameters for last 24 hours:    Intake/Output from previous day: 01/02 0701 - 01/03 0700 In: 861.7 [I.V.:861.7] Out: 1100 [Urine:600; Emesis/NG output:100; Stool:400]  Intake/Output this shift: Total I/O In: 133.3 [I.V.:33.3; Other:100] Out: -   Vent settings for last 24 hours: Vent Mode: PRVC FiO2 (%):  [21 %] 21 % Set Rate:  [16 bmp] 16 bmp Vt Set:  [400 mL] 400 mL PEEP:  [5 cmH20] 5 cmH20 Pressure Support:  [5 cmH20] 5 cmH20 Plateau Pressure:  [11 cmH20] 11 cmH20  Physical Exam:  Vital signs:       Please see the above listed vital signs Patient is unresponsive, on mechanical ventilation, norepinephrine, tachycardic with a pulse of 140 and marginal blood pressures HEENT:           Patient is orally intubated, trachea midline, no thyromegaly appreciated, right internal jugular central line has been placed Cardiovascular:           Tachycardia, appears to be sinus tachycardia with a rate of 1 30-1 40 Pulmonary:      Rhonchi appreciated diffusely Abdominal:      Positive bowel sounds, soft exam Extremities:     Fluctuance noted in the right BKA with some demarcation and skin with foul-smelling drainage Neurologic:      Patient is on sedation status post intubation limited exam at this time  Assessment/Plan:  Septic shock.    Source most likely from BKA, status post debridement.  Is on Rocephin for Proteus sepsis.  Still requiring 15 mcg/min of Neo-Synephrine.  We will continue to try breathing trials.  We will coordinate with vascular surgery pending additional surgical procedures will dictate when we can extubate her  Respiratory failure.  Patient failed spontaneous breathing trial yesterday.  Atrial fibrillation.  On amiodarone  Leukocytosis.  White count is 28.7 on Rocephin for Proteus sepsis  Anemia.  Hemoglobin 7.3 no clinical evidence of active  bleeding  Hyperglycemia.  On scale coverage  Hypophosphatemia we will replace   Hermelinda Dellen, DO  Critical care time 30 minutes Amiri Riechers 07/19/2018  *Care during the described time interval was provided by me and/or other providers on the critical care team.  I have reviewed this patient's available data, including medical history, events of note, physical examination and test results as part of my evaluation. Patient ID: Melissa [name], female   DOB: [birth year], 78 y.o.   MRN: [id number] Patient ID: Melissa [name], female   DOB: [birth year], 78 y.o.   MRN: [id number] Patient ID: Melissa [name], female   DOB: [birth year], 78 y.o.   MRN: [id number]

## 2018-07-19 NOTE — Progress Notes (Signed)
[facility] [place] NAME: Melissa [name]    MR#:  [id number]  DATE OF BIRTH:  09-13-1940  CC:  Patient with recent right BKA for severe PAD was sent to rehab less than a week ago brought back in for unresponsive episode and noted to be septic and infection at the stump site. Intubated and sedated, continues to require pressor support, case discussed with intensivist -continue mechanical ventilation, vascular surgery planning for conversion to AKA once stable   REVIEW OF SYSTEMS:  Review of Systems  Unable to perform ROS: Critical illness    DRUG ALLERGIES:   Allergies  Allergen Reactions  . Lodine [Etodolac] Hives and Itching  . Methotrexate Derivatives Other (See Comments)    Flu like symptoms  . Nitrofuran Derivatives Nausea And Vomiting and Other (See Comments)    Made sicker   . Levaquin [Levofloxacin] Itching, Rash and Other (See Comments)    Muscle pain  . Norvasc [Amlodipine] Other (See Comments)    Unknown   . Pentasa [Mesalamine Er] Other (See Comments)    Unknown   . Sulfa Antibiotics Itching and Rash  . Vioxx [Rofecoxib] Other (See Comments)    Unknown     VITALS:  Blood pressure 122/69, pulse 93, temperature 98.2 F (36.8 C), resp. rate 19, height 5' 4"  (1.626 m), weight 48.6 kg, SpO2 99 %.  PHYSICAL EXAMINATION:  Physical Exam   GENERAL:  78 y.o.-year-old patient lying in the bed, debated on the ventilator.  Critically ill-appearing EYES: Pupils equal, round, reactive to light and accommodation. No scleral icterus. Extraocular muscles intact.  HEENT: Head atraumatic, normocephalic. Oropharynx and nasopharynx clear.  NECK:  Supple, no jugular venous distention. No thyroid enlargement, no tenderness.  LUNGS: Normal breath sounds bilaterally, no wheezing, rales,rhonchi or crepitation. No use of accessory muscles of respiration.  Decreased bibasilar breath sounds CARDIOVASCULAR: S1, S2 normal. No  rubs, or gallops.   2/6 systolic murmur is present ABDOMEN: Soft, nontender, nondistended. Bowel sounds present. No organomegaly or mass.  EXTREMITIES: No pedal edema, cyanosis, or clubbing.  NEUROLOGIC: Intubated and sedated.  Not responding PSYCHIATRIC: The patient is sedated.  SKIN: No obvious rash, lesion, or ulcer.    LABORATORY PANEL:   CBC Recent Labs  Lab 07/19/18 0619  WBC 28.7*  HGB 7.3*  HCT 24.5*  PLT 285   ------------------------------------------------------------------------------------------------------------------  Chemistries  Recent Labs  Lab 07/01/2018 0402  07/19/18 0619  NA 145   < > 149*  K 4.2   < > 4.0  CL 115*   < > 128*  CO2 17*   < > 18*  GLUCOSE 207*   < > 147*  BUN 76*   < > 46*  CREATININE 1.05*   < > 0.60  CALCIUM 6.7*   < > 7.3*  MG  --   --  1.9  AST 66*  --   --   ALT 46*  --   --   ALKPHOS 117  --   --   BILITOT 0.9  --   --    < > = values in this interval not displayed.   ------------------------------------------------------------------------------------------------------------------  Cardiac Enzymes Recent Labs  Lab 07/02/2018 1935  TROPONINI 0.07*   ------------------------------------------------------------------------------------------------------------------  RADIOLOGY:  Dg Chest Port 1 View  Result Date: 07/18/2018 CLINICAL DATA:  Patient on ventilator EXAM: PORTABLE CHEST 1 VIEW COMPARISON:  Chest radiograph 07/17/2018 FINDINGS: Right IJ central venous catheter tip projects over the superior vena cava.  ET tube terminates in the mid trachea. Enteric tube courses inferior to the diaphragm. Monitoring leads overlie the patient. Stable cardiac and mediastinal contours. Interval improved aeration of the left lung base with minimal residual heterogeneous opacities left and right lung base. Probable small bilateral pleural effusions. IMPRESSION: Slight interval improvement in aeration of the lung bases with mild residual basilar opacities and  small effusions. ET tube mid trachea. Right IJ central venous catheter tip projects over the superior vena cava. Electronically Signed   By: Lovey Newcomer M.D.   On: 07/18/2018 10:24    EKG:   Orders placed or performed during the hospital encounter of 06/18/2018  . ED EKG 12-Lead  . ED EKG 12-Lead  . EKG 12-Lead  . EKG 12-Lead    ASSESSMENT AND PLAN:  78 year old female with past medical history significant for COPD, CAD, GERD, severe PAD with prior left BKA and most recent right BKA less than 2 weeks ago discharged to rehab presents with sepsis  *Acute Proteus septic shock  Secondary to infected stump, cannot exclude possible adrenal insufficiency-on IV steroids Continue sepsis protocol, vasopressor support with Neo-Synephrine w/ weaning as tolerated, IV Rocephin, IV fluids for rehydration   *Chronic PAD, left BKA stump infection  Vascular surgery input appreciated- will need conversion to AKA once stable   *Acute metabolic acidosis Secondary to sepsis Continue IV fluids for rehydration  *Anemia of chronic disease No acute indication for transfusion at this time  *Elevated troponin likely demand ischemia continue to monitor cardiac enzymes  *A. fib with RVR amiodarone drip   All the records are reviewed and case discussed with Care Management/Social Workerr. Management plans discussed with the patient, family and they are in agreement.  CODE STATUS: Full Code  TOTAL TIME TAKING CARE OF THIS PATIENT: 38 minutes.   POSSIBLE D/C IN ? DAYS, DEPENDING ON CLINICAL CONDITION.   Melissa [name]  M.D on 07/19/2018 at 2:01 PM  Between 7am to 6pm - Pager - [phone]  After 6pm go to [email or web address] - password [email or web address] [facility]  Office  [phone]  CC: Primary care physician; Melissa Crouch, MD

## 2018-07-19 NOTE — Progress Notes (Signed)
   07/19/18 1945  Clinical Encounter Type  Visited With Patient and family together  Visit Type Initial;Spiritual support;Patient actively dying  Referral From Nurse  Consult/Referral To Chaplain  Spiritual Encounters  Spiritual Needs Emotional;Grief support   CH received a page to provide support for the family as Ms. Wagenaar has been made comfort care. I offered my support to the family and let them know that I would be available all night if I am needed.

## 2018-07-19 NOTE — Consult Note (Signed)
PHARMACY CONSULT NOTE - FOLLOW UP  Pharmacy Consult for Electrolyte Monitoring and Replacement   Assessment: Pharmacy consulted for electrolyte monitoring and replacement in 78 yo female admitted with sepsis secondary to infected right BKA stump site.   Recent Labs: Potassium (mmol/L)  Date Value  07/19/2018 4.0  06/16/2013 3.8   Magnesium (mg/dL)  Date Value  07/19/2018 1.9   Calcium (mg/dL)  Date Value  07/19/2018 7.3 (L)   Calcium, Total (mg/dL)  Date Value  06/16/2013 9.7   Albumin (g/dL)  Date Value  07/04/2018 2.0 (L)   Phosphorus (mg/dL)  Date Value  07/19/2018 1.6 (L)   Goal of Therapy:  Electrolytes WNL   Plan:  1/3 AM: K: 4.0, Na: 149, Phos: 1.6. Per Dietitian patient is high risk of refeeding syndrome.   NaCl @ 53m/hr discontinued during rounds. Patient started on D5W @ 563mhr.  Patient also ordered Phos-Nak x 3 doses PT today during rounds. If potassium or sodium level do down will plan to replace with IV Kphos or NaPhos in AM.     ShPernell DuprePharmD, BCPS Clinical Pharmacist 07/19/2018 11:43 AM

## 2018-07-19 NOTE — Progress Notes (Signed)
Patient ID: Melissa [name], female   DOB: May 01, 1941, 78 y.o.   MRN: [id number] Pulmonary / critical care attending  Discussed with family they wish to proceed with palliative extubation and comfort care.  Will place comfort care order set, morphine infusion titratable to pain, Ativan for anxiety, Robinul for excess secretions.  Offered chaplain services to family  Hermelinda Dellen, DO

## 2018-07-19 NOTE — Progress Notes (Signed)
Patient ID: Melissa [name], female   DOB: 1941/03/06, 78 y.o.   MRN: [id number] Pulmonary/critical care attending  Spoke with family extensively with Dr. Delana Meyer.  We went over the extensive surgery that Melissa [name] would require, the prolonged hospitalization, possibly tracheostomy, ongoing pressor requirement secondary to septic shock.  Family states that she would not want any prolonged mechanical ventilation, tracheostomy, feeding tube.  Discussing with family about transitioning to comfort care and palliative extubation.  Hermelinda Dellen, DO

## 2018-07-20 NOTE — Progress Notes (Signed)
Patient ID: Melissa [name], female   DOB: 1940-09-22, 78 y.o.   MRN: [id number]  [employer] PROGRESS NOTE  Melissa [name] OVF:[id number] DOB: [birth year] DOA: 07/11/2018 PCP: [name], MD  HPI/Subjective: Patient made comfort care measures yesterday.  Currently on morphine drip.  Periods of apnea.  Barely responsive to sternal rub.  As per nursing staff family stepped out to get breakfast.  Objective: Vitals:   07/20/18 0000 07/20/18 0351  BP:  97/63  Pulse: 92 91  Resp: 13   Temp:  98.3 F (36.8 C)  SpO2: 94% 95%    Filed Weights   07/11/2018 1248 06/20/2018 1705  Weight: 48.9 kg 48.6 kg    ROS: Review of Systems  Unable to perform ROS: Acuity of condition   Exam: Physical Exam  HENT:  Nose: No mucosal edema.  Mouth/Throat: No oropharyngeal exudate.  Mouth dry  Eyes: Conjunctivae and lids are normal.  Pupils pinpoint.  Closes eyes when I flashlight in the eye  Neck: Carotid bruit is not present. No thyromegaly present.  Cardiovascular: Regular rhythm, S1 normal and S2 normal.  No murmur heard. Respiratory: Apnea noted. She has decreased breath sounds in the right lower field and the left lower field. She has no wheezes. She has no rhonchi. She has no rales.  GI: Soft. Bowel sounds are normal. There is no abdominal tenderness.  Musculoskeletal:     Right knee: She exhibits swelling.     Left knee: She exhibits no swelling.  Neurological:  Melissa [name] responsive to sternal rub  Skin:  Right stump necrotic and foul-smelling  Psychiatric:  Barely responsive to sternal rub      Data Reviewed: Basic Metabolic Panel: Recent Labs  Lab 06/19/2018 0402 07/17/18 0407 07/17/18 0959 07/18/18 0849 07/19/18 0619  NA 145 145 148* 147* 149*  K 4.2 3.0* 3.4* 3.1* 4.0  CL 115* 121* 124* 124* 128*  CO2 17* 16* 17* 15* 18*  GLUCOSE 207* 154* 126* 153* 147*  BUN 76* 55* 51* 46* 46*  CREATININE 1.05* 0.65 0.66 0.58 0.60  CALCIUM 6.7* 5.9* 6.5* 7.0* 7.3*  MG  --   --    --   --  1.9  PHOS  --   --   --   --  1.6*   Liver Function Tests: Recent Labs  Lab 06/30/2018 1252 06/22/2018 0402  AST 74* 66*  ALT 52* 46*  ALKPHOS 81 117  BILITOT 0.6 0.9  PROT 6.5 5.7*  ALBUMIN 2.7* 2.0*   CBC: Recent Labs  Lab 06/27/2018 1252 07/08/2018 0402 07/17/18 0407 07/18/18 0849 07/19/18 0619  WBC 28.9* 39.4* 29.4* 31.4* 28.7*  NEUTROABS 25.8* 35.7*  --  28.2*  --   HGB 9.0* 8.1* 7.7* 8.2* 7.3*  HCT 28.6* 25.8* 24.4* 25.9* 24.5*  MCV 95.0 98.9 95.7 95.9 100.0  PLT 384 445* 312 299 285   Cardiac Enzymes: Recent Labs  Lab 06/30/2018 1252 07/11/2018 1935  TROPONINI 0.09* 0.07*    CBG: Recent Labs  Lab 07/18/18 2333 07/19/18 0343 07/19/18 0806 07/19/18 1207 07/19/18 1630  GLUCAP 144* 118* 129* 120* 192*    Recent Results (from the past 240 hour(s))  Blood Culture (routine x 2)     Status: Abnormal   Collection Time: 07/02/2018 12:52 PM  Result Value Ref Range Status   Specimen Description   Final    BLOOD RIGHT HAND Performed at [facility], [address], [place], [place] [address]    Special Requests  Final    BOTTLES DRAWN AEROBIC AND ANAEROBIC Blood Culture adequate volume Performed at [facility], [address] [place], [place] [address]    Culture  Setup Time   Final    GRAM NEGATIVE RODS IN BOTH AEROBIC AND ANAEROBIC BOTTLES CRITICAL RESULT CALLED TO, READ BACK BY AND VERIFIED WITH: DAVID BESANTI 06/18/2018 @ 0236  [place] Performed at [facility], [place] [address], [place], [place] [address]    Culture PROTEUS MIRABILIS (A)  Final   Report Status 07/18/2018 FINAL  Final   Organism ID, Bacteria PROTEUS MIRABILIS  Final      Susceptibility   Proteus mirabilis - MIC*    AMPICILLIN <=2 SENSITIVE Sensitive     CEFAZOLIN <=4 SENSITIVE Sensitive     CEFEPIME <=1 SENSITIVE Sensitive     CEFTAZIDIME <=1 SENSITIVE Sensitive     CEFTRIAXONE <=1 SENSITIVE Sensitive     CIPROFLOXACIN <=0.25 SENSITIVE Sensitive      GENTAMICIN <=1 SENSITIVE Sensitive     IMIPENEM 2 SENSITIVE Sensitive     TRIMETH/SULFA <=20 SENSITIVE Sensitive     AMPICILLIN/SULBACTAM <=2 SENSITIVE Sensitive     PIP/TAZO <=4 SENSITIVE Sensitive     * PROTEUS MIRABILIS  Blood Culture (routine x 2)     Status: Abnormal   Collection Time: 07/12/2018 12:52 PM  Result Value Ref Range Status   Specimen Description   Final    BLOOD BLOOD LEFT HAND Performed at [facility], [address], [place], [place] [address]    Special Requests   Final    BOTTLES DRAWN AEROBIC AND ANAEROBIC Blood Culture adequate volume Performed at [facility], [address], [place], [place] [address]    Culture  Setup Time   Final    GRAM NEGATIVE RODS IN BOTH AEROBIC AND ANAEROBIC BOTTLES CRITICAL RESULT CALLED TO, READ BACK BY AND VERIFIED WITH: DAVID BESANTI AT 0236 ON 06/26/2018 [facility] Performed at [facility], [address] [place], [place] [address]    Culture (A)  Final    PROTEUS MIRABILIS SUSCEPTIBILITIES PERFORMED ON PREVIOUS CULTURE WITHIN THE LAST 5 DAYS. Performed at [facility], [place] [address] [place], [place] [address]    Report Status 07/18/2018 FINAL  Final  Blood Culture ID Panel (Reflexed)     Status: Abnormal   Collection Time: 06/23/2018 12:52 PM  Result Value Ref Range Status   Enterococcus species NOT DETECTED NOT DETECTED Final   Listeria monocytogenes NOT DETECTED NOT DETECTED Final   Staphylococcus species NOT DETECTED NOT DETECTED Final   Staphylococcus aureus (BCID) NOT DETECTED NOT DETECTED Final   Streptococcus species NOT DETECTED NOT DETECTED Final   Streptococcus agalactiae NOT DETECTED NOT DETECTED Final   Streptococcus pneumoniae NOT DETECTED NOT DETECTED Final   Streptococcus pyogenes NOT DETECTED NOT DETECTED Final   Acinetobacter baumannii NOT DETECTED NOT DETECTED Final   Enterobacteriaceae species DETECTED (A) NOT DETECTED Final    Comment: Enterobacteriaceae represent a  large family of gram-negative bacteria, not a single organism. CRITICAL RESULT CALLED TO, READ BACK BY AND VERIFIED WITH: DAVID BESANTI 06/29/2018 @ 0236 MLK    Enterobacter cloacae complex NOT DETECTED NOT DETECTED Final   Escherichia coli NOT DETECTED NOT DETECTED Final   Klebsiella oxytoca NOT DETECTED NOT DETECTED Final   Klebsiella pneumoniae NOT DETECTED NOT DETECTED Final   Proteus species DETECTED (A) NOT DETECTED Final    Comment: CRITICAL RESULT CALLED TO, READ BACK BY AND VERIFIED WITH: DAVID BESANTI  07/10/2018 @ 0236  MLK    Serratia marcescens NOT DETECTED NOT DETECTED Final   Carbapenem resistance NOT DETECTED NOT DETECTED Final   Haemophilus influenzae NOT DETECTED NOT DETECTED Final   Neisseria meningitidis NOT DETECTED NOT DETECTED Final   Pseudomonas aeruginosa NOT DETECTED NOT DETECTED Final   Candida albicans NOT DETECTED NOT DETECTED Final   Candida glabrata NOT DETECTED NOT DETECTED Final   Candida krusei NOT DETECTED NOT DETECTED Final   Candida parapsilosis NOT DETECTED NOT DETECTED Final   Candida tropicalis NOT DETECTED NOT DETECTED Final    Comment: Performed at [facility], [address], [place], [place] [address]  Urine culture     Status: None   Collection Time: 07/02/2018  1:16 PM  Result Value Ref Range Status   Specimen Description   Final    URINE, CLEAN CATCH Performed at [facility], [address] [place], [place] [address]    Special Requests   Final    NONE Performed at [facility], [address], [place], [place] [address]    Culture   Final    NO GROWTH Performed at [facility], [place] [address], [place], [place] [address]    Report Status 06/20/2018 FINAL  Final  MRSA PCR Screening     Status: None   Collection Time: 07/01/2018  4:59 PM  Result Value Ref Range Status   MRSA by PCR NEGATIVE NEGATIVE Final    Comment:        The GeneXpert MRSA Assay (FDA approved for NASAL specimens only), is one  component of a comprehensive MRSA colonization surveillance program. It is not intended to diagnose MRSA infection nor to guide or monitor treatment for MRSA infections. Performed at [facility], [address] [place], [place] [address]   Aerobic/Anaerobic Culture (surgical/deep wound)     Status: None (Preliminary result)   Collection Time: 07/14/2018  2:17 PM  Result Value Ref Range Status   Specimen Description   Final    KNEE BELOW RIGHT Performed at [facility], [address], [place], [place] [address]    Special Requests   Final    NONE Performed at [facility], [address] [place], [place] [address]    Gram Stain   Final    RARE WBC PRESENT, PREDOMINANTLY PMN RARE GRAM NEGATIVE RODS Performed at [facility], [place] [address] [place], [place] [address]    Culture   Final    ABUNDANT PROTEUS MIRABILIS NO ANAEROBES ISOLATED; CULTURE IN PROGRESS FOR 5 DAYS    Report Status PENDING  Incomplete   Organism ID, Bacteria PROTEUS MIRABILIS  Final      Susceptibility   Proteus mirabilis - MIC*    AMPICILLIN <=2 SENSITIVE Sensitive     CEFAZOLIN <=4 SENSITIVE Sensitive     CEFEPIME <=1 SENSITIVE Sensitive     CEFTAZIDIME <=1 SENSITIVE Sensitive     CEFTRIAXONE <=1 SENSITIVE Sensitive     CIPROFLOXACIN <=0.25 SENSITIVE Sensitive     GENTAMICIN <=1 SENSITIVE Sensitive     IMIPENEM 4 SENSITIVE Sensitive     TRIMETH/SULFA <=20 SENSITIVE Sensitive     AMPICILLIN/SULBACTAM <=2 SENSITIVE Sensitive     PIP/TAZO <=4 SENSITIVE Sensitive     * ABUNDANT PROTEUS MIRABILIS     Studies: Dg Chest Port 1 View  Result Date: 07/18/2018 CLINICAL DATA:  Patient on ventilator EXAM: PORTABLE CHEST 1 VIEW COMPARISON:  Chest radiograph 07/17/2018 FINDINGS: Right IJ  central venous catheter tip projects over the superior vena cava. ET tube terminates in the mid trachea. Enteric tube courses inferior to the diaphragm. Monitoring leads overlie the patient.  Stable cardiac and mediastinal contours. Interval improved aeration of the left lung base with minimal residual heterogeneous opacities left and right lung base. Probable small bilateral pleural effusions. IMPRESSION: Slight interval improvement in aeration of the lung bases with mild residual basilar opacities and small effusions. ET tube mid trachea. Right IJ central venous catheter tip projects over the superior vena cava. Electronically Signed   By: Lovey Newcomer M.D.   On: 07/18/2018 10:24    Scheduled Meds: . glycopyrrolate  0.1 mg Intravenous Once  . scopolamine  1 patch Transdermal Q72H   Continuous Infusions: . morphine 4 mg/hr (07/20/18 0300)    Assessment/Plan:  1. 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  Proteus growing in blood cultures. 2. Acute encephalopathy 3. Acute hypoxic respiratory failure 4. Acute kidney injury 5. Anemia of chronic disease 6. Peripheral vascular disease 7. Paroxysmal atrial fibrillation 8. Moderate malnutrition  Family had made the patient comfort care yesterday with palliative care team and critical care specialist.  Patient currently on morphine drip and as needed medications.  The patient does have periods of apnea on my exam today.  Overall prognosis is poor and I do not expect the patient to survive much longer than 24 hours.  Code Status:     Code Status Orders  (From admission, onward)         Start     Ordered   07/19/18 1425  Do not attempt resuscitation (DNR)  Continuous    Question Answer Comment  In the event of cardiac or respiratory ARREST Do not call a "code blue"   In the event of cardiac or respiratory ARREST Do not perform Intubation, CPR, defibrillation or ACLS   In the event of cardiac or respiratory ARREST Use medication by any route, position, wound care, and other measures to relive pain and suffering. May use oxygen, suction and manual treatment of airway obstruction as needed for comfort.      07/19/18 1424         Code Status History    Date Active Date Inactive Code Status Order ID Comments User Context   06/26/2018 1757 07/11/2018 2018 Full Code [id number]  Katha Cabal, MD Inpatient   06/21/2018 1728 06/22/2018 1737 Full Code [id number]  Katha Cabal, MD Inpatient   02/08/2018 1016 02/13/2018 2200 Full Code [id number]  Delana Meyer Dolores Lory, MD Inpatient   12/05/2017 1908 12/19/2017 1407 Full Code [id number]  Delana Meyer Dolores Lory, MD Inpatient   11/27/2017 1217 11/27/2017 1717 Full Code [id number]  Delana Meyer Dolores Lory, MD Inpatient   09/07/2017 0835 09/07/2017 1333 Full Code [id number]  Teodoro Spray, MD Inpatient   04/19/2017 1750 04/25/2017 1753 Full Code [id number]  Vaughan Basta, MD Inpatient   04/04/2016 1027 04/07/2016 1451 Full Code [id number]  Hessie Knows, MD Inpatient     Family Communication: Family stepped out to get breakfast. Disposition Plan: Patient will not survive the hospital stay  Time spent: 24 minutes  [place]

## 2018-07-20 NOTE — Progress Notes (Signed)
Report given to 1C RN, pt. Transported with morphine gtt running, no care concerns at this time.

## 2018-07-21 MED ORDER — MORPHINE 100MG IN NS 100ML (1MG/ML) PREMIX INFUSION
8.0000 mg/h | INTRAVENOUS | Status: DC
  Administered 2018-07-21 – 2018-07-22 (×2): 6 mg/h via INTRAVENOUS
  Administered 2018-07-22 – 2018-07-23 (×2): 8 mg/h via INTRAVENOUS
  Filled 2018-07-21 (×4): qty 100

## 2018-07-21 NOTE — Progress Notes (Signed)
Patient ID: Melissa [name], female   DOB: 1941-06-29, 78 y.o.   MRN: [id number]  [facility] PROGRESS NOTE  Melissa [name]:[id number] DOB: Jul 18, 1940 DOA: 06/16/2018 PCP: [name], MD  HPI/Subjective: Patient with comfort care measures and periods of apnea lasting 10 seconds before another breath.  Objective: Vitals:   07/20/18 2247 07/21/18 0501  BP: (!) 105/51 (!) 117/59  Pulse: 93 (!) 134  Resp: 12   Temp: 100 F (37.8 C) (!) 101.8 F (38.8 C)  SpO2:  94%    Filed Weights   06/23/2018 1248 06/26/2018 1705  Weight: 48.9 kg 48.6 kg    ROS: Review of Systems  Unable to perform ROS: Acuity of condition   Exam: Physical Exam  HENT:  Nose: No mucosal edema.  Mouth/Throat: No oropharyngeal exudate.  Mouth dry  Eyes: Conjunctivae and lids are normal.  Pupils pinpoint.  Neck: Carotid bruit is not present. No thyromegaly present.  Cardiovascular: Regular rhythm, S1 normal and S2 normal.  No murmur heard. Respiratory: Apnea noted. She has decreased breath sounds in the right lower field and the left lower field. She has no wheezes. She has no rhonchi. She has no rales.  GI: Soft. Bowel sounds are normal. There is no abdominal tenderness.  Musculoskeletal:     Right knee: She exhibits swelling.     Left knee: She exhibits no swelling.  Neurological:  Moves arms in almost a posturing type movement to sternal rub.  Skin:  Right stump necrotic and foul-smelling  Psychiatric:  Barely responsive to sternal rub      Data Reviewed: Basic Metabolic Panel: Recent Labs  Lab 07/02/2018 0402 07/17/18 0407 07/17/18 0959 07/18/18 0849 07/19/18 0619  NA 145 145 148* 147* 149*  K 4.2 3.0* 3.4* 3.1* 4.0  CL 115* 121* 124* 124* 128*  CO2 17* 16* 17* 15* 18*  GLUCOSE 207* 154* 126* 153* 147*  BUN 76* 55* 51* 46* 46*  CREATININE 1.05* 0.65 0.66 0.58 0.60  CALCIUM 6.7* 5.9* 6.5* 7.0* 7.3*  MG  --   --   --   --  1.9  PHOS  --   --   --   --  1.6*   Liver Function  Tests: Recent Labs  Lab 07/03/2018 1252 06/28/2018 0402  AST 74* 66*  ALT 52* 46*  ALKPHOS 81 117  BILITOT 0.6 0.9  PROT 6.5 5.7*  ALBUMIN 2.7* 2.0*   CBC: Recent Labs  Lab 07/08/2018 1252 07/04/2018 0402 07/17/18 0407 07/18/18 0849 07/19/18 0619  WBC 28.9* 39.4* 29.4* 31.4* 28.7*  NEUTROABS 25.8* 35.7*  --  28.2*  --   HGB 9.0* 8.1* 7.7* 8.2* 7.3*  HCT 28.6* 25.8* 24.4* 25.9* 24.5*  MCV 95.0 98.9 95.7 95.9 100.0  PLT 384 445* 312 299 285   Cardiac Enzymes: Recent Labs  Lab 06/20/2018 1252 07/06/2018 1935  TROPONINI 0.09* 0.07*    CBG: Recent Labs  Lab 07/18/18 2333 07/19/18 0343 07/19/18 0806 07/19/18 1207 07/19/18 1630  GLUCAP 144* 118* 129* 120* 192*    Recent Results (from the past 240 hour(s))  Blood Culture (routine x 2)     Status: Abnormal   Collection Time: 07/06/2018 12:52 PM  Result Value Ref Range Status   Specimen Description   Final    BLOOD RIGHT HAND Performed at [facility], [address], [place], [place] [address]    Special Requests   Final    BOTTLES DRAWN AEROBIC AND ANAEROBIC Blood Culture adequate  volume Performed at [facility], [address] [place], [place] [address]    Culture  Setup Time   Final    GRAM NEGATIVE RODS IN BOTH AEROBIC AND ANAEROBIC BOTTLES CRITICAL RESULT CALLED TO, READ BACK BY AND VERIFIED WITH: DAVID BESANTI 06/26/2018 @ 0236  [place] Performed at [facility], [place] [address], [place], [place] [address]    Culture PROTEUS MIRABILIS (A)  Final   Report Status 07/18/2018 FINAL  Final   Organism ID, Bacteria PROTEUS MIRABILIS  Final      Susceptibility   Proteus mirabilis - MIC*    AMPICILLIN <=2 SENSITIVE Sensitive     CEFAZOLIN <=4 SENSITIVE Sensitive     CEFEPIME <=1 SENSITIVE Sensitive     CEFTAZIDIME <=1 SENSITIVE Sensitive     CEFTRIAXONE <=1 SENSITIVE Sensitive     CIPROFLOXACIN <=0.25 SENSITIVE Sensitive     GENTAMICIN <=1 SENSITIVE Sensitive     IMIPENEM 2 SENSITIVE Sensitive      TRIMETH/SULFA <=20 SENSITIVE Sensitive     AMPICILLIN/SULBACTAM <=2 SENSITIVE Sensitive     PIP/TAZO <=4 SENSITIVE Sensitive     * PROTEUS MIRABILIS  Blood Culture (routine x 2)     Status: Abnormal   Collection Time: 07/03/2018 12:52 PM  Result Value Ref Range Status   Specimen Description   Final    BLOOD BLOOD LEFT HAND Performed at [facility], [address], [place], [place] [address]    Special Requests   Final    BOTTLES DRAWN AEROBIC AND ANAEROBIC Blood Culture adequate volume Performed at [facility], [address], [place], [place] [address]    Culture  Setup Time   Final    GRAM NEGATIVE RODS IN BOTH AEROBIC AND ANAEROBIC BOTTLES CRITICAL RESULT CALLED TO, READ BACK BY AND VERIFIED WITH: DAVID BESANTI AT 0236 ON 06/26/2018 [facility] Performed at [facility], [address] [place], [place] [address]    Culture (A)  Final    PROTEUS MIRABILIS SUSCEPTIBILITIES PERFORMED ON PREVIOUS CULTURE WITHIN THE LAST 5 DAYS. Performed at [facility], [place] [address], [place], [place] [address]    Report Status 07/18/2018 FINAL  Final  Blood Culture ID Panel (Reflexed)     Status: Abnormal   Collection Time: 06/21/2018 12:52 PM  Result Value Ref Range Status   Enterococcus species NOT DETECTED NOT DETECTED Final   Listeria monocytogenes NOT DETECTED NOT DETECTED Final   Staphylococcus species NOT DETECTED NOT DETECTED Final   Staphylococcus aureus (BCID) NOT DETECTED NOT DETECTED Final   Streptococcus species NOT DETECTED NOT DETECTED Final   Streptococcus agalactiae NOT DETECTED NOT DETECTED Final   Streptococcus pneumoniae NOT DETECTED NOT DETECTED Final   Streptococcus pyogenes NOT DETECTED NOT DETECTED Final   Acinetobacter baumannii NOT DETECTED NOT DETECTED Final   Enterobacteriaceae species DETECTED (A) NOT DETECTED Final    Comment: Enterobacteriaceae represent a large family of gram-negative bacteria, not a single organism. CRITICAL  RESULT CALLED TO, READ BACK BY AND VERIFIED WITH: DAVID BESANTI 07/11/2018 @ 0236 MLK    Enterobacter cloacae complex NOT DETECTED NOT DETECTED Final   Escherichia coli NOT DETECTED NOT DETECTED Final   Klebsiella oxytoca NOT DETECTED NOT DETECTED Final   Klebsiella pneumoniae NOT DETECTED NOT DETECTED Final   Proteus species DETECTED (A) NOT DETECTED Final    Comment: CRITICAL RESULT CALLED TO, READ BACK BY AND VERIFIED WITH: DAVID BESANTI 06/16/2018 @ 0236  MLK    Serratia marcescens NOT DETECTED  NOT DETECTED Final   Carbapenem resistance NOT DETECTED NOT DETECTED Final   Haemophilus influenzae NOT DETECTED NOT DETECTED Final   Neisseria meningitidis NOT DETECTED NOT DETECTED Final   Pseudomonas aeruginosa NOT DETECTED NOT DETECTED Final   Candida albicans NOT DETECTED NOT DETECTED Final   Candida glabrata NOT DETECTED NOT DETECTED Final   Candida krusei NOT DETECTED NOT DETECTED Final   Candida parapsilosis NOT DETECTED NOT DETECTED Final   Candida tropicalis NOT DETECTED NOT DETECTED Final    Comment: Performed at [facility], [address] [place], [place] [address]  Urine culture     Status: None   Collection Time: 07/09/2018  1:16 PM  Result Value Ref Range Status   Specimen Description   Final    URINE, CLEAN CATCH Performed at [facility], [address], [place], [place] [address]    Special Requests   Final    NONE Performed at [facility], [address] [place], [place] [address]    Culture   Final    NO GROWTH Performed at [facility], [place] [address] [place], [place] [address]    Report Status 07/09/2018 FINAL  Final  MRSA PCR Screening     Status: None   Collection Time: 07/13/2018  4:59 PM  Result Value Ref Range Status   MRSA by PCR NEGATIVE NEGATIVE Final    Comment:        The GeneXpert MRSA Assay (FDA approved for NASAL specimens only), is one component of a comprehensive MRSA colonization surveillance program. It  is not intended to diagnose MRSA infection nor to guide or monitor treatment for MRSA infections. Performed at [facility], [address] [place], [place] [address]   Aerobic/Anaerobic Culture (surgical/deep wound)     Status: None (Preliminary result)   Collection Time: 07/03/2018  2:17 PM  Result Value Ref Range Status   Specimen Description   Final    KNEE BELOW RIGHT Performed at [facility], [address], [place], [place] [address]    Special Requests   Final    NONE Performed at [facility], [place] [place], [place] [address]    Gram Stain   Final    RARE WBC PRESENT, PREDOMINANTLY PMN RARE GRAM NEGATIVE RODS Performed at [facility], [place] [address] [place], [place] [address]    Culture   Final    ABUNDANT PROTEUS MIRABILIS NO ANAEROBES ISOLATED; CULTURE IN PROGRESS FOR 5 DAYS    Report Status PENDING  Incomplete   Organism ID, Bacteria PROTEUS MIRABILIS  Final      Susceptibility   Proteus mirabilis - MIC*    AMPICILLIN <=2 SENSITIVE Sensitive     CEFAZOLIN <=4 SENSITIVE Sensitive     CEFEPIME <=1 SENSITIVE Sensitive     CEFTAZIDIME <=1 SENSITIVE Sensitive     CEFTRIAXONE <=1 SENSITIVE Sensitive     CIPROFLOXACIN <=0.25 SENSITIVE Sensitive     GENTAMICIN <=1 SENSITIVE Sensitive     IMIPENEM 4 SENSITIVE Sensitive     TRIMETH/SULFA <=20 SENSITIVE Sensitive     AMPICILLIN/SULBACTAM <=2 SENSITIVE Sensitive     PIP/TAZO <=4 SENSITIVE Sensitive     * ABUNDANT PROTEUS MIRABILIS     Studies: No results found.  Scheduled Meds: . glycopyrrolate  0.1 mg Intravenous Once  . scopolamine  1 patch Transdermal Q72H   Continuous Infusions: . morphine 4 mg/hr (07/20/18 0300)    Assessment/Plan:  1. 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  Proteus growing in blood cultures. 2. Acute encephalopathy 3. Acute hypoxic respiratory failure 4. Acute kidney injury 5. Anemia of chronic disease 6. Peripheral vascular  disease 7. Paroxysmal atrial fibrillation 8. Moderate malnutrition  Spoke with family at the bedside.  Increase morphine drip to 6 mg/h.  They want to keep her comfortable at this time.  If there is any chance that she is in pain they want to keep her comfortable.  Continue other supportive care with Ativan glycopyrrolate and scopolamine.  Code Status:     Code Status Orders  (From admission, onward)         Start     Ordered   07/19/18 1425  Do not attempt resuscitation (DNR)  Continuous    Question Answer Comment  In the event of cardiac or respiratory ARREST Do not call a "code blue"   In the event of cardiac or respiratory ARREST Do not perform Intubation, CPR, defibrillation or ACLS   In the event of cardiac or respiratory ARREST Use medication by any route, position, wound care, and other measures to relive pain and suffering. May use oxygen, suction and manual treatment of airway obstruction as needed for comfort.      07/19/18 1424        Code Status History    Date Active Date Inactive Code Status Order ID Comments User Context   06/26/2018 1757 07/11/2018 2018 Full Code [id number]  Katha Cabal, MD Inpatient   06/21/2018 1728 06/22/2018 1737 Full Code [id number]  Katha Cabal, MD Inpatient   02/08/2018 1016 02/13/2018 2200 Full Code [id number]  Delana Meyer Dolores Lory, MD Inpatient   12/05/2017 1908 12/19/2017 1407 Full Code [id number]  Delana Meyer Dolores Lory, MD Inpatient   11/27/2017 1217 11/27/2017 1717 Full Code [id number]  Delana Meyer Dolores Lory, MD Inpatient   09/07/2017 0835 09/07/2017 1333 Full Code [id number]  Teodoro Spray, MD Inpatient   04/19/2017 1750 04/25/2017 1753 Full Code [id number]  Vaughan Basta, MD Inpatient   04/04/2016 1027 04/07/2016 1451 Full Code [id number]  Hessie Knows, MD Inpatient     Family Communication: Family at the bedside Disposition Plan: Comfort care measures in the hospital.  Time spent: 25 minutes  [place]

## 2018-07-21 NOTE — Progress Notes (Signed)
Nutrition Brief Note  Chart reviewed. Patient has transitioned to comfort care.   No further nutrition interventions warranted at this time.  Please re-consult RD as needed.   Willey Blade, MS, [place], LDN Office: [phone] Pager: [phone] After Hours/Weekend Pager: [phone]

## 2018-07-21 NOTE — Progress Notes (Signed)
Discussed briefly with family.  Dr. Earleen Newport has already been in today.  Palliative care measures absolutely appropriate.  Emotional support given, questions answered.  Please recontact as needed  Zara Chess, MD

## 2018-07-22 ENCOUNTER — Encounter: Payer: Self-pay | Admitting: Vascular Surgery

## 2018-07-22 DIAGNOSIS — Z515 Encounter for palliative care: Secondary | ICD-10-CM

## 2018-07-22 DIAGNOSIS — R6521 Severe sepsis with septic shock: Secondary | ICD-10-CM

## 2018-07-22 LAB — AEROBIC/ANAEROBIC CULTURE W GRAM STAIN (SURGICAL/DEEP WOUND)

## 2018-07-22 MED ORDER — GLYCOPYRROLATE 0.2 MG/ML IJ SOLN
0.1000 mg | INTRAMUSCULAR | Status: DC | PRN
  Filled 2018-07-22: qty 0.5

## 2018-07-22 MED ORDER — MORPHINE BOLUS VIA INFUSION
4.0000 mg | INTRAVENOUS | Status: DC | PRN
  Filled 2018-07-22: qty 4

## 2018-07-22 MED ORDER — LORAZEPAM 2 MG/ML IJ SOLN: 1.0000 mg | INTRAMUSCULAR | Status: DC | PRN

## 2018-07-22 MED ORDER — LORAZEPAM 2 MG/ML IJ SOLN
1.0000 mg | Freq: Three times a day (TID) | INTRAMUSCULAR | Status: DC
  Administered 2018-07-22 (×2): 1 mg via INTRAVENOUS
  Filled 2018-07-22 (×2): qty 1

## 2018-07-22 MED ORDER — GLYCOPYRROLATE 0.2 MG/ML IJ SOLN
0.4000 mg | INTRAMUSCULAR | Status: DC | PRN
  Filled 2018-07-22: qty 2

## 2018-07-22 NOTE — Progress Notes (Signed)
[facility] Daily Progress Note    Patient comfortable with daughter at bedside. No questions or concerns from family at this time. Family appreciative of care received from hospital and staff. Will continue to follow along.  Discussed with Dr. Eber Hong Finnley [place] PA-C 07/22/2018 12:58 PM

## 2018-07-22 NOTE — Care Management Important Message (Signed)
Important Message  Patient Details  Name: Melissa [name] MRN: [id number] Date of Birth: February 03, 1941   Medicare Important Message Given:  Yes    Juliann Pulse A Rama Sorci 07/22/2018, 10:58 AM

## 2018-07-22 NOTE — Progress Notes (Signed)
Patient ID: Melissa [name], female   DOB: 10/26/1940, 78 y.o.   MRN: [id number]  [facility] PROGRESS NOTE  SHERISSA TENENBAUM XLK:[id number] DOB: [birth year] DOA: 06/22/2018 PCP: [name], MD  HPI/Subjective: Patient moves arms with sternal rub. As per family yelled out yesterday with movement of position of catheter.  Objective: Vitals:   07/21/18 0501 07/21/18 2001  BP: (!) 117/59 107/61  Pulse: (!) 134 (!) 137  Resp:    Temp: (!) 101.8 F (38.8 C)   SpO2: 94% 96%    Filed Weights   06/23/2018 1248 07/03/2018 1705  Weight: 48.9 kg 48.6 kg    ROS: Review of Systems  Unable to perform ROS: Acuity of condition   Exam: Physical Exam  HENT:  Nose: No mucosal edema.  Mouth/Throat: No oropharyngeal exudate.  Mouth dry  Eyes: Conjunctivae and lids are normal.  Pupils pinpoint.  Neck: Carotid bruit is not present. No thyromegaly present.  Cardiovascular: Regular rhythm, S1 normal and S2 normal.  No murmur heard. Respiratory: Apnea noted. She has decreased breath sounds in the right lower field and the left lower field. She has no wheezes. She has no rhonchi. She has no rales.  GI: Soft. Bowel sounds are normal. There is no abdominal tenderness.  Musculoskeletal:     Right knee: She exhibits swelling.     Left knee: She exhibits no swelling.  Neurological:  Moves arms to sternal rub.  Skin:  Right stump necrotic and foul-smelling  Psychiatric:  Barely responsive to sternal rub      Data Reviewed: Basic Metabolic Panel: Recent Labs  Lab 06/23/2018 0402 07/17/18 0407 07/17/18 0959 07/18/18 0849 07/19/18 0619  NA 145 145 148* 147* 149*  K 4.2 3.0* 3.4* 3.1* 4.0  CL 115* 121* 124* 124* 128*  CO2 17* 16* 17* 15* 18*  GLUCOSE 207* 154* 126* 153* 147*  BUN 76* 55* 51* 46* 46*  CREATININE 1.05* 0.65 0.66 0.58 0.60  CALCIUM 6.7* 5.9* 6.5* 7.0* 7.3*  MG  --   --   --   --  1.9  PHOS  --   --   --   --  1.6*   Liver Function Tests: Recent Labs  Lab  07/01/2018 1252 06/23/2018 0402  AST 74* 66*  ALT 52* 46*  ALKPHOS 81 117  BILITOT 0.6 0.9  PROT 6.5 5.7*  ALBUMIN 2.7* 2.0*   CBC: Recent Labs  Lab 06/18/2018 1252 06/29/2018 0402 07/17/18 0407 07/18/18 0849 07/19/18 0619  WBC 28.9* 39.4* 29.4* 31.4* 28.7*  NEUTROABS 25.8* 35.7*  --  28.2*  --   HGB 9.0* 8.1* 7.7* 8.2* 7.3*  HCT 28.6* 25.8* 24.4* 25.9* 24.5*  MCV 95.0 98.9 95.7 95.9 100.0  PLT 384 445* 312 299 285   Cardiac Enzymes: Recent Labs  Lab 07/06/2018 1252 06/23/2018 1935  TROPONINI 0.09* 0.07*    CBG: Recent Labs  Lab 07/18/18 2333 07/19/18 0343 07/19/18 0806 07/19/18 1207 07/19/18 1630  GLUCAP 144* 118* 129* 120* 192*    Recent Results (from the past 240 hour(s))  Blood Culture (routine x 2)     Status: Abnormal   Collection Time: 06/22/2018 12:52 PM  Result Value Ref Range Status   Specimen Description   Final    BLOOD RIGHT HAND Performed at [facility], [address], [place], [place] [address]    Special Requests   Final    BOTTLES DRAWN AEROBIC AND ANAEROBIC Blood Culture adequate volume Performed at [facility],  [place], [place] [address]    Culture  Setup Time   Final    GRAM NEGATIVE RODS IN BOTH AEROBIC AND ANAEROBIC BOTTLES CRITICAL RESULT CALLED TO, READ BACK BY AND VERIFIED WITH: DAVID BESANTI 06/24/2018 @ 0236  [place] Performed at [facility], [place] [address] [place], [place] [address]    Culture PROTEUS MIRABILIS (A)  Final   Report Status 07/18/2018 FINAL  Final   Organism ID, Bacteria PROTEUS MIRABILIS  Final      Susceptibility   Proteus mirabilis - MIC*    AMPICILLIN <=2 SENSITIVE Sensitive     CEFAZOLIN <=4 SENSITIVE Sensitive     CEFEPIME <=1 SENSITIVE Sensitive     CEFTAZIDIME <=1 SENSITIVE Sensitive     CEFTRIAXONE <=1 SENSITIVE Sensitive     CIPROFLOXACIN <=0.25 SENSITIVE Sensitive     GENTAMICIN <=1 SENSITIVE Sensitive     IMIPENEM 2 SENSITIVE Sensitive     TRIMETH/SULFA <=20  SENSITIVE Sensitive     AMPICILLIN/SULBACTAM <=2 SENSITIVE Sensitive     PIP/TAZO <=4 SENSITIVE Sensitive     * PROTEUS MIRABILIS  Blood Culture (routine x 2)     Status: Abnormal   Collection Time: 07/13/2018 12:52 PM  Result Value Ref Range Status   Specimen Description   Final    BLOOD BLOOD LEFT HAND Performed at [facility], [address], [place], [place] [address]    Special Requests   Final    BOTTLES DRAWN AEROBIC AND ANAEROBIC Blood Culture adequate volume Performed at [facility], [address], [place], [place] [address]    Culture  Setup Time   Final    GRAM NEGATIVE RODS IN BOTH AEROBIC AND ANAEROBIC BOTTLES CRITICAL RESULT CALLED TO, READ BACK BY AND VERIFIED WITH: DAVID BESANTI AT 0236 ON 07/04/2018 [facility] Performed at [facility], [address] [place], [place] [address]    Culture (A)  Final    PROTEUS MIRABILIS SUSCEPTIBILITIES PERFORMED ON PREVIOUS CULTURE WITHIN THE LAST 5 DAYS. Performed at [facility], [place] [address] [place], [place] [address]    Report Status 07/18/2018 FINAL  Final  Blood Culture ID Panel (Reflexed)     Status: Abnormal   Collection Time: 06/22/2018 12:52 PM  Result Value Ref Range Status   Enterococcus species NOT DETECTED NOT DETECTED Final   Listeria monocytogenes NOT DETECTED NOT DETECTED Final   Staphylococcus species NOT DETECTED NOT DETECTED Final   Staphylococcus aureus (BCID) NOT DETECTED NOT DETECTED Final   Streptococcus species NOT DETECTED NOT DETECTED Final   Streptococcus agalactiae NOT DETECTED NOT DETECTED Final   Streptococcus pneumoniae NOT DETECTED NOT DETECTED Final   Streptococcus pyogenes NOT DETECTED NOT DETECTED Final   Acinetobacter baumannii NOT DETECTED NOT DETECTED Final   Enterobacteriaceae species DETECTED (A) NOT DETECTED Final    Comment: Enterobacteriaceae represent a large family of gram-negative bacteria, not a single organism. CRITICAL RESULT CALLED TO, READ  BACK BY AND VERIFIED WITH: DAVID BESANTI 06/21/2018 @ 0236 MLK    Enterobacter cloacae complex NOT DETECTED NOT DETECTED Final   Escherichia coli NOT DETECTED NOT DETECTED Final   Klebsiella oxytoca NOT DETECTED NOT DETECTED Final   Klebsiella pneumoniae NOT DETECTED NOT DETECTED Final   Proteus species DETECTED (A) NOT DETECTED Final    Comment: CRITICAL RESULT CALLED TO, READ BACK BY AND VERIFIED WITH: DAVID BESANTI 06/17/2018 @ 0236  MLK    Serratia marcescens NOT DETECTED NOT DETECTED Final   Carbapenem  resistance NOT DETECTED NOT DETECTED Final   Haemophilus influenzae NOT DETECTED NOT DETECTED Final   Neisseria meningitidis NOT DETECTED NOT DETECTED Final   Pseudomonas aeruginosa NOT DETECTED NOT DETECTED Final   Candida albicans NOT DETECTED NOT DETECTED Final   Candida glabrata NOT DETECTED NOT DETECTED Final   Candida krusei NOT DETECTED NOT DETECTED Final   Candida parapsilosis NOT DETECTED NOT DETECTED Final   Candida tropicalis NOT DETECTED NOT DETECTED Final    Comment: Performed at [facility], [address], [place], [place] [address]  Urine culture     Status: None   Collection Time: 06/16/2018  1:16 PM  Result Value Ref Range Status   Specimen Description   Final    URINE, CLEAN CATCH Performed at [facility], [address], [place], [place] [address]    Special Requests   Final    NONE Performed at [facility], [address] [place], [place] [address]    Culture   Final    NO GROWTH Performed at [facility], [place] [address] [place], [place] [address]    Report Status 06/24/2018 FINAL  Final  MRSA PCR Screening     Status: None   Collection Time: 06/23/2018  4:59 PM  Result Value Ref Range Status   MRSA by PCR NEGATIVE NEGATIVE Final    Comment:        The GeneXpert MRSA Assay (FDA approved for NASAL specimens only), is one component of a comprehensive MRSA colonization surveillance program. It is not intended to  diagnose MRSA infection nor to guide or monitor treatment for MRSA infections. Performed at [facility], [address] [place], [place] [address]   Aerobic/Anaerobic Culture (surgical/deep wound)     Status: None (Preliminary result)   Collection Time: 07/04/2018  2:17 PM  Result Value Ref Range Status   Specimen Description   Final    KNEE BELOW RIGHT Performed at [facility], [address] [place], [place] [address]    Special Requests   Final    NONE Performed at [facility], [address] [place], [place] [address]    Gram Stain   Final    RARE WBC PRESENT, PREDOMINANTLY PMN RARE GRAM NEGATIVE RODS Performed at [facility], [place] [address], [place], [place] [address]    Culture   Final    ABUNDANT PROTEUS MIRABILIS NO ANAEROBES ISOLATED; CULTURE IN PROGRESS FOR 5 DAYS    Report Status PENDING  Incomplete   Organism ID, Bacteria PROTEUS MIRABILIS  Final      Susceptibility   Proteus mirabilis - MIC*    AMPICILLIN <=2 SENSITIVE Sensitive     CEFAZOLIN <=4 SENSITIVE Sensitive     CEFEPIME <=1 SENSITIVE Sensitive     CEFTAZIDIME <=1 SENSITIVE Sensitive     CEFTRIAXONE <=1 SENSITIVE Sensitive     CIPROFLOXACIN <=0.25 SENSITIVE Sensitive     GENTAMICIN <=1 SENSITIVE Sensitive     IMIPENEM 4 SENSITIVE Sensitive     TRIMETH/SULFA <=20 SENSITIVE Sensitive     AMPICILLIN/SULBACTAM <=2 SENSITIVE Sensitive     PIP/TAZO <=4 SENSITIVE Sensitive     * ABUNDANT PROTEUS MIRABILIS     Studies: No results found.  Scheduled Meds: . scopolamine  1 patch Transdermal Q72H   Continuous Infusions: . morphine 6 mg/hr (07/22/18 0116)    Assessment/Plan:  1. 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  Proteus growing in blood cultures. 2. Acute encephalopathy 3. Acute  hypoxic respiratory failure 4. Acute kidney injury 5. Anemia of chronic disease 6. Peripheral vascular disease 7. Paroxysmal atrial fibrillation 8. Moderate  malnutrition  Spoke with family at the bedside.  Increase morphine drip to 8 mg/h.  They want to keep her comfortable at this time. Continue other supportive care with Ativan glycopyrrolate and scopolamine.  Code Status:     Code Status Orders  (From admission, onward)         Start     Ordered   07/19/18 1425  Do not attempt resuscitation (DNR)  Continuous    Question Answer Comment  In the event of cardiac or respiratory ARREST Do not call a "code blue"   In the event of cardiac or respiratory ARREST Do not perform Intubation, CPR, defibrillation or ACLS   In the event of cardiac or respiratory ARREST Use medication by any route, position, wound care, and other measures to relive pain and suffering. May use oxygen, suction and manual treatment of airway obstruction as needed for comfort.      07/19/18 1424        Code Status History    Date Active Date Inactive Code Status Order ID Comments User Context   06/26/2018 1757 07/11/2018 2018 Full Code [id number]  Katha Cabal, MD Inpatient   06/21/2018 1728 06/22/2018 1737 Full Code [id number]  Katha Cabal, MD Inpatient   02/08/2018 1016 02/13/2018 2200 Full Code [id number]  Delana Meyer Dolores Lory, MD Inpatient   12/05/2017 1908 12/19/2017 1407 Full Code [id number]  Delana Meyer Dolores Lory, MD Inpatient   11/27/2017 1217 11/27/2017 1717 Full Code [id number]  Delana Meyer Dolores Lory, MD Inpatient   09/07/2017 0835 09/07/2017 1333 Full Code [id number]  Teodoro Spray, MD Inpatient   04/19/2017 1750 04/25/2017 1753 Full Code [id number]  Vaughan Basta, MD Inpatient   04/04/2016 1027 04/07/2016 1451 Full Code [id number]  Hessie Knows, MD Inpatient     Family Communication: Family at the bedside Disposition Plan: Patient appears comfortable.  Comfort care measures here.  Time spent: 24 minutes  [place]

## 2018-07-22 NOTE — Consult Note (Signed)
Consultation Note Date: 07/22/2018   Patient Name: Melissa [name]  DOB: Jan 01, 1941  MRN: [id number]  Age / Sex: 78 y.o., female  PCP: [name], MD Referring Physician: Loletha Grayer, MD  Reason for Consultation: Establishing goals of care  HPI/Patient Profile: 78 y.o. female  with past medical history of recent BKA, CAD, COPD, anemia, rheumatoid arthritis, Raynaud's disease, falls admitted on 06/29/2018 with AMS r/t septic shock r/t infected right BKA.   Clinical Assessment and Goals of Care: I met initially with Melissa [name]'s husband at bedside and then with daughter, [name], as well. They understand that Melissa [name] is expected to pass soon - likely hours to 1-2 days. They both clearly indicate their concern that she is as comfortable as possible. We discussed management of symptoms with morphine infusion and adding scheduled Ativan given her use of Xanax at home multiple times daily. She does appear at this time comfortably resting on morphine infusion. Family report clear discomfort with any touch or movement. Discussed with family and nursing to give morphine bolus via infusion prior to any activity. Not stable for transfer and expect hospital death.   Primary Decision Maker NEXT OF KIN husband and children (2 daughters, 1 son)    SUMMARY OF RECOMMENDATIONS   - Full comfort care - Expect hospital death  Code Status/Advance Care Planning:  DNR   Symptom Management:   Pain/SOB: Morphine infusion is at 8 mg/hr. Added bolus 4 mg every 30 min prn.   Anxiety: Ativan IV 1 mg TID and 1-2 mg every hour prn.   Glycopyrrolate prn secretions.   Limit movement and activity.   Palliative Prophylaxis:   Frequent Pain Assessment, Oral Care and Turn Reposition  Additional Recommendations (Limitations, Scope, Preferences):  Full Comfort Care  Psycho-social/Spiritual:   Desire for further  Chaplaincy support:yes  Additional Recommendations: Grief/Bereavement Support  Prognosis:   Hours - Days  Discharge Planning: Anticipated Hospital Death      Primary Diagnoses: Present on Admission: . Septic shock ([place])   I have reviewed the medical record, interviewed the patient and family, and examined the patient. The following aspects are pertinent.  Past Medical History:  Diagnosis Date  . Anemia 12/2017   blood transfusion after amputation  . Anxiety   . Arthritis    rheumatoid  . Ataxia   . Chronic ulcer of right great toe ([place]) 2019   slowly healing  . COPD (chronic obstructive pulmonary disease) ([place])   . Coronary artery disease 2018   complete blockage in one vessel, 40 % in another, 25% in 3rd vessell  . Depression   . Diverticulosis   . Dysrhythmia    atrial fib  . Excessive falling   . GERD (gastroesophageal reflux disease)    ulcerative colitis  . Hypercholesteremia   . Hyperlipidemia   . Hypertension   . IBS (irritable bowel syndrome)   . MRSA (methicillin resistant staph aureus) culture positive 11/2017   patient unaware of this diagnosis, was never informed or treated  . Myocardial  infarction ([place]) 08/2017   had angio but no stents  . Osteoarthritis   . Psoriasis   . Raynaud's disease without gangrene   . Raynaud's disease without gangrene   . Shortness of breath dyspnea    with exertion  . 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 Marital status: Married    Spouse name: Juanda Crumble  . Number of children: 3  . Years of education: Not on file  . Highest education level: 9th grade  Occupational History  . Not on file  Social Needs  . Financial resource strain: Not very hard  . Food insecurity:    Worry: Never true    Inability: Never true  . Transportation needs:    Medical: No    Non-medical: Not on file  Tobacco Use  . Smoking status: Never Smoker  . Smokeless  tobacco: Never Used  Substance and Sexual Activity  . Alcohol use: No  . Drug use: No  . Sexual activity: Not Currently  Lifestyle  . Physical activity:    Days per week: 0 days    Minutes per session: Not on file  . Stress: Only a little  Relationships  . Social connections:    Talks on phone: More than three times a week    Gets together: Once a week    Attends religious service: More than 4 times per year    Active member of club or organization: No    Attends meetings of clubs or organizations: Never    Relationship status: Married  Other Topics Concern  . Not on file  Social History Narrative  . Not on file   Family History  Problem Relation Age of Onset  . CAD Mother   . CAD Father   . Alzheimer's disease Sister   . CAD Sister   . CAD Brother   . Breast cancer Neg Hx    Scheduled Meds: . scopolamine  1 patch Transdermal Q72H   Continuous Infusions: . morphine 6 mg/hr (07/22/18 0116)   PRN Meds:.glycopyrrolate, LORazepam, morphine Allergies  Allergen Reactions  . Lodine [Etodolac] Hives and Itching  . Methotrexate Derivatives Other (See Comments)    Flu like symptoms  . Nitrofuran Derivatives Nausea And Vomiting and Other (See Comments)    Made sicker   . Levaquin [Levofloxacin] Itching, Rash and Other (See Comments)    Muscle pain  . Norvasc [Amlodipine] Other (See Comments)    Unknown   . Pentasa [Mesalamine Er] Other (See Comments)    Unknown   . Sulfa Antibiotics Itching and Rash  . Vioxx [Rofecoxib] Other (See Comments)    Unknown    Review of Systems  Unable to perform ROS: Acuity of condition    Physical Exam Vitals signs and nursing note reviewed.  Constitutional:      Appearance: She is cachectic. She is ill-appearing.  Pulmonary:     Effort: No tachypnea, accessory muscle usage or respiratory distress.     Comments: Apneic episodes notes Neurological:     Mental Status: She is unresponsive.     Vital Signs: BP 107/61   Pulse (!)  137   Temp (!) 101.8 F (38.8 C) (Axillary)   Resp 12   Ht _0  (1.626 m)   Wt 48.6 kg   SpO2 96%   BMI 18.39 kg/m  Pain Scale: PAINAD POSS *See Group Information*: S-Acceptable,Sleep, easy to arouse Pain Score: Asleep  SpO2: SpO2: 96 % O2 Device:SpO2: 96 % O2 Flow Rate: .   IO: Intake/output summary:   Intake/Output Summary (Last 24 hours) at 07/22/2018 0915 Last data filed at 07/21/2018 2005 Gross per 24 hour  Intake -  Output 400 ml  Net -400 ml    LBM: Last BM Date: 07/19/18 Baseline Weight: Weight: 48.9 kg Most recent weight: Weight: 48.6 kg     Palliative Assessment/Data: 10%     Time In/Out: 4232-0094, 1100-1120 Time Total: 40 min Greater than 50%  of this time was spent counseling and coordinating care related to the above assessment and plan.  Signed by: Vinie Sill, NP Palliative Medicine Team Pager # [phone] (M-F 8a-5p) Team Phone # [phone] (Nights/Weekends)

## 2018-07-23 DIAGNOSIS — Z515 Encounter for palliative care: Secondary | ICD-10-CM

## 2018-07-25 ENCOUNTER — Ambulatory Visit (INDEPENDENT_AMBULATORY_CARE_PROVIDER_SITE_OTHER): Payer: Self-pay | Admitting: Vascular Surgery

## 2018-08-17 NOTE — Progress Notes (Signed)
Patient pronounced deceased at 15 . Dr Leslye Peer and family at bedside.

## 2018-08-17 NOTE — Death Summary Note (Signed)
DEATH SUMMARY   Patient Details  Name: Melissa [name] MRN: [id number] DOB: 09/19/1940  Admission/Discharge Information   Admit Date:  Jul 29, 2018  Date of Death: Date of Death: 06-Aug-2018  Time of Death: Time of Death: 0955  Length of Stay: 09/07/22  Referring Physician: [name], MD   Reason(s) for Hospitalization  Septic shock  Diagnoses  Preliminary cause of death:  Secondary Diagnoses (including complications and co-morbidities):  Active Problems:   Septic shock (HCC)   Pressure injury of skin   Terminal care   Palliative care encounter   Brief Hospital Course (including significant findings, care, treatment, and services provided and events leading to death)  Melissa [name] is a 78 y.o. year old female who I admitted to the hospital on Jul 29, 2018 with septic shock from infected right BKA.  She was intubated soon after I saw her secondary to acute encephalopathy and poor responsiveness.  She was given IV antibiotics stress dose steroids and IV fluids and started on pressors.  She was admitted to the ICU.  She was seen by vascular surgery and the critical care team and the palliative care team.  The initial plan was potentially get an above-knee amputation but the patient was critically ill and this was delayed.  The patient had Proteus mirabilis growing in the blood cultures and wound culture.  Family made the patient comfort measures on Friday evening 07/19/2018.  The patient was on morphine drip until I pronounced her on 2018/08/06 at 9:55 AM.    Pertinent Labs and Studies  Significant Diagnostic Studies Dg Wrist 2 Views Right  Result Date: 07/05/2018 CLINICAL DATA:  Fall, landed on right wrist.  Pain EXAM: RIGHT WRIST - 2 VIEW COMPARISON:  None. 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 Diffuse vascular calcifications. No fracture. Slight widening of the scapholunate distance. IMPRESSION: Degenerative changes in the  right wrist.  No fracture. Slight widening of the scapholunate distance could reflect scapholunate dissociation. Electronically Signed   By: Rolm Baptise M.D.   On: 07/05/2018 10:35   Dg Knee 2 Views Right  Result Date: 07/29/18 CLINICAL DATA:  Right below-knee amputation stump infection. EXAM: RIGHT KNEE - 1-2 VIEW COMPARISON:  None. FINDINGS: Status post right below-knee amputation. Vascular calcifications are noted no lytic defect is seen. However, small amount of gas is noted in the distal soft tissues of the stump suggesting infection. IMPRESSION: Small amount of soft tissue gas seen in distal portion of BKA stump suggesting infection. Electronically Signed   By: Marijo Conception, M.D.   On: Jul 29, 2018 13:31   Dg Abd 1 View  Result Date: 2018/07/29 CLINICAL DATA:  OG placement EXAM: ABDOMEN - 1 VIEW COMPARISON:  CT 01/19/2017 FINDINGS: Esophageal tube tip overlies the gastric body. Visible upper gas pattern is unobstructed. Surgical hardware in the lower spine. IMPRESSION: Esophageal tube tip overlies the mid abdomen, presumably over the gastric body. Electronically Signed   By: Donavan Foil M.D.   On: 07/29/18 18:25   Ct Head Wo Contrast  Result Date: 07/05/2018 CLINICAL DATA:  Fall EXAM: CT HEAD WITHOUT CONTRAST TECHNIQUE: Contiguous axial images were obtained from the base of the skull through the vertex without intravenous contrast. COMPARISON:  06/08/2017 FINDINGS: Brain: Mild global atrophy appropriate to age. Minimal chronic ischemic changes in the periventricular white matter. There is no mass effect, midline shift, or acute intracranial hemorrhage. Vascular: No hyperdense vessel or unexpected calcification. Skull: The cranium is intact. Sinuses/Orbits: Visualized mastoid  air cells and paranasal sinuses are clear. Orbits are grossly within normal limits. Other: Soft tissue hematoma over the right temporal bone is noted. IMPRESSION: No acute intracranial pathology. Electronically  Signed   By: Marybelle Killings M.D.   On: 07/05/2018 09:03   Dg Chest Port 1 View  Result Date: 07/18/2018 CLINICAL DATA:  Patient on ventilator EXAM: PORTABLE CHEST 1 VIEW COMPARISON:  Chest radiograph 07/17/2018 FINDINGS: Right IJ central venous catheter tip projects over the superior vena cava. ET tube terminates in the mid trachea. Enteric tube courses inferior to the diaphragm. Monitoring leads overlie the patient. Stable cardiac and mediastinal contours. Interval improved aeration of the left lung base with minimal residual heterogeneous opacities left and right lung base. Probable small bilateral pleural effusions. IMPRESSION: Slight interval improvement in aeration of the lung bases with mild residual basilar opacities and small effusions. ET tube mid trachea. Right IJ central venous catheter tip projects over the superior vena cava. Electronically Signed   By: Lovey Newcomer M.D.   On: 07/18/2018 10:24   Dg Chest Port 1 View  Result Date: 07/17/2018 CLINICAL DATA:  Acute respiratory failure. EXAM: PORTABLE CHEST 1 VIEW COMPARISON:  07/06/2018 FINDINGS: The patient is rotated to the right. Endotracheal tube terminates 4.5 cm above the carina. Right jugular catheter terminates over the mid to lower SVC. Enteric tube courses into the left upper abdomen with tip not imaged. The cardiomediastinal silhouette is unchanged with normal heart size. Basilar predominant interstitial densities in both lungs on the prior study have improved. There are likely small persistent right and possibly left pleural effusions. No pneumothorax is identified. IMPRESSION: Improved bilateral lung aeration. Electronically Signed   By: Logan Bores M.D.   On: 07/17/2018 07:00   Dg Chest Port 1 View  Result Date: 06/17/2018 CLINICAL DATA:  Intubation. EXAM: PORTABLE CHEST 1 VIEW COMPARISON:  06/22/2018. FINDINGS: Interim placement of NG tube, its tip is below left hemidiaphragm. Endotracheal tube, right IJ line stable position. Heart  size normal. Bibasilar. Bibasilar infiltrates/edema. Small bilateral pleural effusions. IMPRESSION: 1. Interim placement of NG tube, its tip is below left hemidiaphragm. Endotracheal tube and right IJ line stable position. 2. Bibasilar atelectasis and bibasilar infiltrates/edema. Small bilateral pleural effusions. Electronically Signed   By: Marcello Moores  Register   On: 06/27/2018 09:06   Dg Chest Portable 1 View  Result Date: 07/09/2018 CLINICAL DATA:  ET tube insertion EXAM: PORTABLE CHEST 1 VIEW COMPARISON:  06/16/2018 FINDINGS: Endotracheal tube with the tip 3.3 cm above the carina. Right jugular central venous catheter with the tip projecting over the SVC. Chronic elevation of the right diaphragm. Mild hazy left basilar airspace disease likely reflecting atelectasis. No pleural effusion or pneumothorax. Stable cardiomediastinal silhouette. No aggressive osseous lesion. IMPRESSION: Endotracheal tube with the tip 3.3 cm above the carina. Right jugular central venous catheter with the tip projecting over the SVC. Electronically Signed   By: Kathreen Devoid   On: 07/10/2018 16:10   Dg Chest Portable 1 View  Result Date: 06/25/2018 CLINICAL DATA:  Central line placement EXAM: PORTABLE CHEST 1 VIEW COMPARISON:  06/25/2018 FINDINGS: Right central line is in place with the tip at the cavoatrial junction. No pneumothorax. Bibasilar atelectasis. Mild vascular congestion. Suspect small effusions. No acute bony abnormality. IMPRESSION: Right central line tip at the cavoatrial junction.  No pneumothorax. Vascular congestion, bibasilar atelectasis and possible small effusions. Electronically Signed   By: Rolm Baptise M.D.   On: 07/13/2018 15:07   Dg Chest [facility]  Result Date: 06/27/2018 CLINICAL DATA:  Recent right leg amputation.  Possible sepsis. EXAM: PORTABLE CHEST 1 VIEW COMPARISON:  07/04/2018 FINDINGS: Heart is normal size. Bibasilar atelectasis or infiltrates noted. Mild elevation of the right  hemidiaphragm. No visible effusions or acute bony abnormality. IMPRESSION: Bibasilar atelectasis or infiltrates. Mild elevation of the right hemidiaphragm. Electronically Signed   By: Rolm Baptise M.D.   On: 06/18/2018 13:24   Dg Chest Port 1 View  Result Date: 07/04/2018 CLINICAL DATA:  Chest pain EXAM: PORTABLE CHEST 1 VIEW COMPARISON:  12/14/2017 FINDINGS: Heart is normal size. Chronic increased markings throughout the lungs, likely scarring. No effusions or confluent opacities. No acute bony abnormality. IMPRESSION: Chronic changes.  No active disease. Electronically Signed   By: Rolm Baptise M.D.   On: 07/04/2018 11:07    Microbiology Recent Results (from the past 240 hour(s))  Blood Culture (routine x 2)     Status: Abnormal   Collection Time: 07/09/2018 12:52 PM  Result Value Ref Range Status   Specimen Description   Final    BLOOD RIGHT HAND Performed at [facility], [address], [place], [place] [address]    Special Requests   Final    BOTTLES DRAWN AEROBIC AND ANAEROBIC Blood Culture adequate volume Performed at [facility], [address] [place], [place] [address]    Culture  Setup Time   Final    GRAM NEGATIVE RODS IN BOTH AEROBIC AND ANAEROBIC BOTTLES CRITICAL RESULT CALLED TO, READ BACK BY AND VERIFIED WITH: DAVID BESANTI 06/21/2018 @ 0236  [place] Performed at [place] [address] [place], [place] [address]    Culture PROTEUS MIRABILIS (A)  Final   Report Status 07/18/2018 FINAL  Final   Organism ID, Bacteria PROTEUS MIRABILIS  Final      Susceptibility   Proteus mirabilis - MIC*    AMPICILLIN <=2 SENSITIVE Sensitive     CEFAZOLIN <=4 SENSITIVE Sensitive     CEFEPIME <=1 SENSITIVE Sensitive     CEFTAZIDIME <=1 SENSITIVE Sensitive     CEFTRIAXONE <=1 SENSITIVE Sensitive     CIPROFLOXACIN <=0.25 SENSITIVE Sensitive     GENTAMICIN <=1 SENSITIVE Sensitive     IMIPENEM 2 SENSITIVE Sensitive     TRIMETH/SULFA <=20 SENSITIVE Sensitive      AMPICILLIN/SULBACTAM <=2 SENSITIVE Sensitive     PIP/TAZO <=4 SENSITIVE Sensitive     * PROTEUS MIRABILIS  Blood Culture (routine x 2)     Status: Abnormal   Collection Time: 07/07/2018 12:52 PM  Result Value Ref Range Status   Specimen Description   Final    BLOOD BLOOD LEFT HAND Performed at [facility], [address] [place], [place] [address]    Special Requests   Final    BOTTLES DRAWN AEROBIC AND ANAEROBIC Blood Culture adequate volume Performed at [facility], [address], [place], [place] [address]    Culture  Setup Time   Final    GRAM NEGATIVE RODS IN BOTH AEROBIC AND ANAEROBIC BOTTLES CRITICAL RESULT CALLED TO, READ BACK BY AND VERIFIED WITH: DAVID BESANTI AT 0236 ON 06/19/2018 [facility] Performed at [facility], [address] [place], [place] [address]    Culture (A)  Final    PROTEUS MIRABILIS SUSCEPTIBILITIES PERFORMED ON PREVIOUS CULTURE WITHIN THE LAST 5 DAYS. Performed at [facility], [place] [address] [place],  [address]    Report Status 07/18/2018 FINAL  Final  Blood Culture ID Panel (Reflexed)  Status: Abnormal   Collection Time: 07/06/2018 12:52 PM  Result Value Ref Range Status   Enterococcus species NOT DETECTED NOT DETECTED Final   Listeria monocytogenes NOT DETECTED NOT DETECTED Final   Staphylococcus species NOT DETECTED NOT DETECTED Final   Staphylococcus aureus (BCID) NOT DETECTED NOT DETECTED Final   Streptococcus species NOT DETECTED NOT DETECTED Final   Streptococcus agalactiae NOT DETECTED NOT DETECTED Final   Streptococcus pneumoniae NOT DETECTED NOT DETECTED Final   Streptococcus pyogenes NOT DETECTED NOT DETECTED Final   Acinetobacter baumannii NOT DETECTED NOT DETECTED Final   Enterobacteriaceae species DETECTED (A) NOT DETECTED Final    Comment: Enterobacteriaceae represent a large family of gram-negative bacteria, not a single organism. CRITICAL RESULT CALLED TO, READ BACK BY AND VERIFIED  WITH: DAVID BESANTI 07/02/2018 @ 0236 [place]    Enterobacter cloacae complex NOT DETECTED NOT DETECTED Final   Escherichia coli NOT DETECTED NOT DETECTED Final   Klebsiella oxytoca NOT DETECTED NOT DETECTED Final   Klebsiella pneumoniae NOT DETECTED NOT DETECTED Final   Proteus species DETECTED (A) NOT DETECTED Final    Comment: CRITICAL RESULT CALLED TO, READ BACK BY AND VERIFIED WITH: DAVID BESANTI 07/05/2018 @ 0236  MLK    Serratia marcescens NOT DETECTED NOT DETECTED Final   Carbapenem resistance NOT DETECTED NOT DETECTED Final   Haemophilus influenzae NOT DETECTED NOT DETECTED Final   Neisseria meningitidis NOT DETECTED NOT DETECTED Final   Pseudomonas aeruginosa NOT DETECTED NOT DETECTED Final   Candida albicans NOT DETECTED NOT DETECTED Final   Candida glabrata NOT DETECTED NOT DETECTED Final   Candida krusei NOT DETECTED NOT DETECTED Final   Candida parapsilosis NOT DETECTED NOT DETECTED Final   Candida tropicalis NOT DETECTED NOT DETECTED Final    Comment: Performed at [facility], [address] [place], [place] [address]  Urine culture     Status: None   Collection Time: 06/28/2018  1:16 PM  Result Value Ref Range Status   Specimen Description   Final    URINE, CLEAN CATCH Performed at [facility], [address], [place], [place] [address]    Special Requests   Final    NONE Performed at [facility], [address] [place], [place] [address]    Culture   Final    NO GROWTH Performed at [facility], [place] [address], [place], [place] [address]    Report Status 06/22/2018 FINAL  Final  MRSA PCR Screening     Status: None   Collection Time: 07/02/2018  4:59 PM  Result Value Ref Range Status   MRSA by PCR NEGATIVE NEGATIVE Final    Comment:        The GeneXpert MRSA Assay (FDA approved for NASAL specimens only), is one component of a comprehensive MRSA colonization surveillance program. It is not intended to diagnose MRSA infection  nor to guide or monitor treatment for MRSA infections. Performed at [facility], [address] [place], [place] [address]   Aerobic/Anaerobic Culture (surgical/deep wound)     Status: None   Collection Time: 06/29/2018  2:17 PM  Result Value Ref Range Status   Specimen Description   Final    KNEE BELOW RIGHT Performed at [facility], [address], [place], [place] [address]    Special Requests   Final    NONE Performed at [facility], [address], [place], [place] [address]    Gram Stain   Final    RARE  WBC PRESENT, PREDOMINANTLY PMN RARE GRAM NEGATIVE RODS Performed at [facility] [address], [place], [place] [address]    Culture   Final    ABUNDANT PROTEUS MIRABILIS NO ANAEROBES ISOLATED    Report Status 07/22/2018 FINAL  Final   Organism ID, Bacteria PROTEUS MIRABILIS  Final      Susceptibility   Proteus mirabilis - MIC*    AMPICILLIN <=2 SENSITIVE Sensitive     CEFAZOLIN <=4 SENSITIVE Sensitive     CEFEPIME <=1 SENSITIVE Sensitive     CEFTAZIDIME <=1 SENSITIVE Sensitive     CEFTRIAXONE <=1 SENSITIVE Sensitive     CIPROFLOXACIN <=0.25 SENSITIVE Sensitive     GENTAMICIN <=1 SENSITIVE Sensitive     IMIPENEM 4 SENSITIVE Sensitive     TRIMETH/SULFA <=20 SENSITIVE Sensitive     AMPICILLIN/SULBACTAM <=2 SENSITIVE Sensitive     PIP/TAZO <=4 SENSITIVE Sensitive     * ABUNDANT PROTEUS MIRABILIS    Lab Basic Metabolic Panel: Recent Labs  Lab 07/17/18 0407 07/17/18 0959 07/18/18 0849 07/19/18 0619  NA 145 148* 147* 149*  K 3.0* 3.4* 3.1* 4.0  CL 121* 124* 124* 128*  CO2 16* 17* 15* 18*  GLUCOSE 154* 126* 153* 147*  BUN 55* 51* 46* 46*  CREATININE 0.65 0.66 0.58 0.60  CALCIUM 5.9* 6.5* 7.0* 7.3*  MG  --   --   --  1.9  PHOS  --   --   --  1.6*   CBC: Recent Labs  Lab 07/17/18 0407 07/18/18 0849 07/19/18 0619  WBC 29.4* 31.4* 28.7*  NEUTROABS  --  28.2*  --   HGB 7.7* 8.2* 7.3*  HCT 24.4* 25.9* 24.5*  MCV  95.7 95.9 100.0  PLT 312 299 285   Sepsis Labs: Recent Labs  Lab 07/17/18 0407 07/18/18 0849 07/19/18 0619  WBC 29.4* 31.4* 28.7*    Procedures/Operations  None   Elhadj Girton 15-Aug-2018, 2:26 PM

## 2018-08-17 NOTE — Progress Notes (Signed)
Patient ID: Melissa [name], female   DOB: Nov 23, 1940, 78 y.o.   MRN: [id number]  When I came into the room the patient was agonal breathing.  She had periods of apnea.  Daughter states that she was moaning a little bit.  While I was still in the room she stopped breathing.  I was unable to auscultate a heart rate or any breath sounds.  Unable to feel a carotid pulse.  Her pupils were fixed and dilated.  Patient was pronounced dead at 9:55 AM.  Case discussed with family and nursing staff.  Time spent on visit 20 minutes  Dr. Loletha Grayer

## 2018-08-17 DEATH — deceased
